# Patient Record
Sex: Female | Born: 1940
Health system: Southern US, Community
[De-identification: ages and names within clinical notes are randomized; demographics above are authoritative.]

## PROBLEM LIST (undated history)

## (undated) VITALS — BP 125/70 | HR 79 | Temp 98.2°F | Resp 18 | Ht 66.0 in | Wt 135.2 lb

## (undated) DIAGNOSIS — K760 Fatty (change of) liver, not elsewhere classified: Secondary | ICD-10-CM

## (undated) DIAGNOSIS — I639 Cerebral infarction, unspecified: Secondary | ICD-10-CM

## (undated) DIAGNOSIS — I4891 Unspecified atrial fibrillation: Secondary | ICD-10-CM

## (undated) DIAGNOSIS — C439 Malignant melanoma of skin, unspecified: Secondary | ICD-10-CM

## (undated) DIAGNOSIS — M1990 Unspecified osteoarthritis, unspecified site: Secondary | ICD-10-CM

## (undated) DIAGNOSIS — K754 Autoimmune hepatitis: Secondary | ICD-10-CM

## (undated) DIAGNOSIS — Z9221 Personal history of antineoplastic chemotherapy: Secondary | ICD-10-CM

## (undated) DIAGNOSIS — I10 Essential (primary) hypertension: Secondary | ICD-10-CM

## (undated) DIAGNOSIS — Z923 Personal history of irradiation: Secondary | ICD-10-CM

## (undated) DIAGNOSIS — K635 Polyp of colon: Secondary | ICD-10-CM

## (undated) DIAGNOSIS — H409 Unspecified glaucoma: Secondary | ICD-10-CM

## (undated) DIAGNOSIS — R569 Unspecified convulsions: Secondary | ICD-10-CM

## (undated) DIAGNOSIS — C50919 Malignant neoplasm of unspecified site of unspecified female breast: Secondary | ICD-10-CM

## (undated) DIAGNOSIS — E7800 Pure hypercholesterolemia, unspecified: Secondary | ICD-10-CM

## (undated) DIAGNOSIS — M810 Age-related osteoporosis without current pathological fracture: Secondary | ICD-10-CM

## (undated) HISTORY — PX: SKIN CANCER EXCISION: SHX779

## (undated) HISTORY — DX: Fatty (change of) liver, not elsewhere classified: K76.0

## (undated) HISTORY — PX: TONSILECTOMY, ADENOIDECTOMY, BILATERAL MYRINGOTOMY AND TUBES: SHX2538

## (undated) HISTORY — DX: Cerebral infarction, unspecified: I63.9

## (undated) HISTORY — DX: Age-related osteoporosis without current pathological fracture: M81.0

## (undated) HISTORY — PX: OTHER SURGICAL HISTORY: SHX169

## (undated) HISTORY — PX: AUGMENTATION MAMMAPLASTY: SUR837

## (undated) HISTORY — PX: TONSILLECTOMY: SUR1361

## (undated) HISTORY — DX: Unspecified atrial fibrillation: I48.91

## (undated) HISTORY — DX: Unspecified convulsions: R56.9

## (undated) HISTORY — DX: Pure hypercholesterolemia, unspecified: E78.00

## (undated) HISTORY — PX: EYE SURGERY: SHX253

## (undated) HISTORY — DX: Malignant melanoma of skin, unspecified: C43.9

## (undated) HISTORY — DX: Polyp of colon: K63.5

## (undated) HISTORY — DX: Essential (primary) hypertension: I10

## (undated) HISTORY — PX: COLONOSCOPY: SHX5424

## (undated) HISTORY — DX: Unspecified glaucoma: H40.9

## (undated) HISTORY — DX: Autoimmune hepatitis: K75.4

## (undated) HISTORY — DX: Unspecified osteoarthritis, unspecified site: M19.90

---

## 1988-10-07 HISTORY — PX: OTHER SURGICAL HISTORY: SHX169

## 2000-04-25 ENCOUNTER — Ambulatory Visit: Admission: RE | Admit: 2000-04-25 | Payer: Self-pay | Source: Ambulatory Visit | Admitting: Plastic Surgery

## 2001-04-03 ENCOUNTER — Ambulatory Visit: Admission: RE | Admit: 2001-04-03 | Payer: Self-pay | Source: Ambulatory Visit | Admitting: Plastic Surgery

## 2002-10-07 DIAGNOSIS — M810 Age-related osteoporosis without current pathological fracture: Secondary | ICD-10-CM

## 2002-10-07 HISTORY — DX: Age-related osteoporosis without current pathological fracture: M81.0

## 2002-10-15 ENCOUNTER — Ambulatory Visit
Admit: 2002-10-15 | Disposition: A | Discharge status: Home / Self Care | Payer: Self-pay | Source: Ambulatory Visit | Admitting: Specialist

## 2002-11-16 ENCOUNTER — Ambulatory Visit
Admit: 2002-11-16 | Disposition: A | Discharge status: Home / Self Care | Payer: Self-pay | Source: Ambulatory Visit | Admitting: Gastroenterology

## 2002-12-07 ENCOUNTER — Ambulatory Visit
Admit: 2002-12-07 | Disposition: A | Discharge status: Home / Self Care | Payer: Self-pay | Source: Ambulatory Visit | Admitting: Gastroenterology

## 2003-05-06 ENCOUNTER — Ambulatory Visit: Admission: RE | Admit: 2003-05-06 | Payer: Self-pay | Source: Ambulatory Visit | Admitting: Plastic Surgery

## 2003-06-10 ENCOUNTER — Ambulatory Visit
Admit: 2003-06-10 | Disposition: A | Discharge status: Home / Self Care | Payer: Self-pay | Source: Ambulatory Visit | Admitting: Gastroenterology

## 2004-10-07 DIAGNOSIS — C50919 Malignant neoplasm of unspecified site of unspecified female breast: Secondary | ICD-10-CM

## 2004-10-07 DIAGNOSIS — Z923 Personal history of irradiation: Secondary | ICD-10-CM

## 2004-10-07 DIAGNOSIS — Z9221 Personal history of antineoplastic chemotherapy: Secondary | ICD-10-CM

## 2004-10-07 HISTORY — DX: Malignant neoplasm of unspecified site of unspecified female breast: C50.919

## 2004-10-07 HISTORY — PX: BREAST BIOPSY: SHX20

## 2004-10-07 HISTORY — PX: OTHER SURGICAL HISTORY: SHX169

## 2004-10-07 HISTORY — PX: SENTINEL NODE BIOPSY: SHX2706

## 2004-10-07 HISTORY — DX: Personal history of antineoplastic chemotherapy: Z92.21

## 2004-10-07 HISTORY — PX: AUGMENTATION MAMMAPLASTY: SUR837

## 2004-10-07 HISTORY — PX: BREAST LUMPECTOMY: SHX2

## 2004-10-07 HISTORY — DX: Personal history of irradiation: Z92.3

## 2004-12-22 ENCOUNTER — Ambulatory Visit
Admit: 2004-12-22 | Disposition: A | Discharge status: Home / Self Care | Payer: Self-pay | Source: Ambulatory Visit | Admitting: Surgery

## 2004-12-27 ENCOUNTER — Ambulatory Visit: Admission: RE | Admit: 2004-12-27 | Payer: Self-pay | Source: Ambulatory Visit | Admitting: Surgery

## 2005-01-15 ENCOUNTER — Ambulatory Visit: Admission: RE | Admit: 2005-01-15 | Payer: Self-pay | Source: Ambulatory Visit | Admitting: Surgery

## 2005-01-15 HISTORY — PX: BREAST LUMPECTOMY: SHX2

## 2005-01-18 ENCOUNTER — Ambulatory Visit
Admit: 2005-01-18 | Disposition: A | Discharge status: Home / Self Care | Payer: Self-pay | Source: Ambulatory Visit | Admitting: Radiation Oncology

## 2005-01-28 ENCOUNTER — Ambulatory Visit: Admission: RE | Admit: 2005-01-28 | Payer: Self-pay | Source: Ambulatory Visit | Admitting: Surgery

## 2005-02-06 ENCOUNTER — Ambulatory Visit
Admit: 2005-02-06 | Disposition: A | Discharge status: Home / Self Care | Payer: Self-pay | Source: Ambulatory Visit | Admitting: Medical Oncology

## 2005-03-22 ENCOUNTER — Ambulatory Visit: Admission: EM | Admit: 2005-03-22 | Payer: Self-pay | Source: Ambulatory Visit | Admitting: Plastic Surgery

## 2005-03-30 ENCOUNTER — Emergency Department: Admit: 2005-03-30 | Payer: Self-pay | Source: Emergency Department | Admitting: Emergency Medicine

## 2005-05-15 ENCOUNTER — Ambulatory Visit
Admit: 2005-05-15 | Disposition: A | Discharge status: Home / Self Care | Payer: Self-pay | Source: Ambulatory Visit | Admitting: Radiation Oncology

## 2006-02-01 ENCOUNTER — Emergency Department: Admit: 2006-02-01 | Payer: Self-pay | Source: Emergency Department

## 2006-05-27 ENCOUNTER — Ambulatory Visit: Admission: RE | Admit: 2006-05-27 | Payer: Self-pay | Source: Ambulatory Visit | Admitting: Plastic Surgery

## 2006-10-07 HISTORY — PX: OTHER SURGICAL HISTORY: SHX169

## 2007-07-28 ENCOUNTER — Ambulatory Visit
Admit: 2007-07-28 | Disposition: A | Discharge status: Home / Self Care | Payer: Self-pay | Source: Ambulatory Visit | Admitting: Medical Oncology

## 2007-07-28 LAB — GFR

## 2007-07-28 LAB — BUN: BUN: 14 MG/DL (ref 7–21)

## 2007-07-28 LAB — CREATININE, SERUM: Creatinine: 0.5 MG/DL (ref 0.5–1.4)

## 2007-08-03 ENCOUNTER — Ambulatory Visit
Admit: 2007-08-03 | Disposition: A | Discharge status: Home / Self Care | Payer: Self-pay | Source: Ambulatory Visit | Admitting: Medical Oncology

## 2008-03-10 ENCOUNTER — Ambulatory Visit
Admit: 2008-03-10 | Disposition: A | Discharge status: Home / Self Care | Payer: Self-pay | Source: Ambulatory Visit | Admitting: Radiation Oncology

## 2008-04-04 ENCOUNTER — Ambulatory Visit
Admit: 2008-04-04 | Disposition: A | Discharge status: Home / Self Care | Payer: Self-pay | Source: Ambulatory Visit | Admitting: Radiation Oncology

## 2008-04-14 ENCOUNTER — Ambulatory Visit
Admit: 2008-04-14 | Disposition: A | Discharge status: Home / Self Care | Payer: Self-pay | Source: Ambulatory Visit | Admitting: Radiation Oncology

## 2008-04-14 LAB — CREATININE, SERUM: Creatinine: 0.7 MG/DL (ref 0.5–1.4)

## 2008-04-14 LAB — GFR

## 2008-04-14 LAB — BUN: BUN: 14 MG/DL (ref 7–21)

## 2008-04-19 ENCOUNTER — Ambulatory Visit
Admit: 2008-04-19 | Disposition: A | Discharge status: Home / Self Care | Payer: Self-pay | Source: Ambulatory Visit | Admitting: Radiation Oncology

## 2009-04-27 ENCOUNTER — Ambulatory Visit
Admit: 2009-04-27 | Disposition: A | Discharge status: Home / Self Care | Payer: Self-pay | Source: Ambulatory Visit | Admitting: Radiation Oncology

## 2010-05-09 ENCOUNTER — Ambulatory Visit
Admit: 2010-05-09 | Disposition: A | Discharge status: Home / Self Care | Payer: Self-pay | Source: Ambulatory Visit | Admitting: Radiation Oncology

## 2011-05-08 ENCOUNTER — Ambulatory Visit
Admit: 2011-05-08 | Discharge: 2011-05-08 | Disposition: A | Discharge status: Home / Self Care | Payer: Self-pay | Source: Ambulatory Visit

## 2011-07-23 LAB — ECG 12-LEAD
Atrial Rate: 76 {beats}/min
P Axis: 67 degrees
P-R Interval: 134 ms
Q-T Interval: 412 ms
QRS Duration: 76 ms
QTC Calculation (Bezet): 463 ms
R Axis: 72 degrees
T Axis: 64 degrees
Ventricular Rate: 76 {beats}/min

## 2011-07-25 NOTE — Progress Notes (Unsigned)
SURGEON:       Babs Bertin, MD      SURGERY DATE:  05/27/2006      ROOM NUMBER:   OPS            CLINICAL NOTE:  This is 70 year old woman who has a history of breast      cancer in the past and prosthetic implant reconstructions.  She has some      capsular contracture and was contemplating revision and implant exchange      for this, but she has been recommended by her radiologist and oncologist to      remove the implants so that her followup might be easier.  She has decided      to agree with them, and therefore, she comes to the operating room for      removal of implants and implant material bilaterally.  At the same time,      she wants to have a mastopexy reconstructive procedures with the tissue      that remains, knowing that she will be much smaller, but able to have a      better contour by these plastic surgery techniques.  Accordingly, she comes      to the operating room at this time for these indicated procedures.            PAST SURGICAL The patient's past history includes the right breast cancer      lumpectomy and the breast implants.  She has had a laparoscopic, a T A, she      has had a number of skin lesions removed for cancer and benign disease.            ALLERGIES:  She denies any allergies.            MEDICATIONS:  Include Evista and _____.            PHYSICAL EXAMINATION:   Height 5 feet 6 inches, weight 120 pounds.  She is      a nonsmoker.  Physical examination reveals the bilateral augmented breasts      with some distortion from capsular contracture and previous surgeries.  The      rest of the physical examination is noncontributory.            PLAN:  The patient has had a full discussion on a couple of occasions, as      to the nature of the proposed procedures.  She understands the possibility      of bleeding, infection, scarring, the necessity of a secondary surgery,      distortion, problems with wound healing, recurrence, damage to surrounding      structures, and other  risks.  Having thus, been duly informed, the patient      comes in for this indicated procedure.                        ___________________________________     Date Signed: _______________      Babs Bertin, MD      D 05/26/2006 11:40 A; T 05/26/2006 11:55 A; [id number] - - , [id number], #[id number]      CC:  Babs Bertin, MD

## 2011-07-25 NOTE — Op Note (Unsigned)
SURGEON:             Babs Bertin, MD      ADMITTED:            05/27/2006      PROCEDURE DATE: 05/27/2006      ROOM NUMBER:         POO POO 08            PREOPERATIVE DIAGNOSIS: Status post breast cancer and presence of breast      implant.            POSTOPERATIVE DIAGNOSIS:  Status post breast cancer and presence of breast      implant.            PROCEDURE PERFORMED: Removal of bilateral implant material and mastopexy      with capsulectomy.            ANESTHESIA:   General.            CLINICAL NOTE:  This 70 year old woman has previously had a diagnosis of      breast cancer and she has had a lumpectomy and other surgeries for this.      She currently has breast implants in place which are ruptured.  The patient      wants removal of the breast implant and not to have them replaced.  She      wants a mastopexy with the remaining tissues.            The patient fully understands the nature of the proposed procedure.  She      understands the possibility of bleeding, infection, scarring, necessity for      secondary surgery, distortion, problems with wound healing, recurrence,      damage to surrounding structures and other risks. She understands the      deflated state of her breast following this procedure which we will try to      improve with a mastopexy.            OPERATIVE TECHNIQUE:  This patient's chest was appropriately prepped and      draped.  Preoperative markings as to nipple position and symmetry had been      marked preoperatively in the holding area while in the sitting position.      According to these markings, excess skin above the nipple-areolar complex      to the new higher position which was saved for it was removed.  Deeper      dissection was done through this opening to expose the capsule of the      deeper implant. This was opened.  Implant material from ruptured implant      was removed.  Much irrigating and swabbing out of this material was done.      The capsule was quite  calcified.  A capsulectomy was done. Irrigation was      done again and hemostasis made to be perfect.  Internal cinching sutures      were used to build up the interior tissues that remained.  The      nipple-areolar complex was then sutured to the higher position saved for it      in a manner the mastopexy and then an advancement throughout and with a      gradient closure was done to fix and properly position at the new      nipple-areolar complex position as was marked out preoperatively.  On the left side an essentially identical procedure was done including the      capsulectomy and the mastopexy and removal of implant material.      Appropriate dressings were placed on all wounds.  The patient was returned      to the recovery room in good condition.            This procedure was done under general anesthesia. It did go quite smoothly      and a good result is certainly hoped for. The patient is instructed on the      local care of the wounds and will be followed in my office as an      outpatient.                                          ___________________________________     Date Signed: _______________      Babs Bertin, MD                  D 05/27/2006  3:13 P; T 05/28/2006  9:21 A; [id number] - - , [id number], #[id number]      CC:  Kassie Mends, MD           Babs Bertin, MD

## 2012-05-19 ENCOUNTER — Encounter: Payer: Self-pay | Admitting: Radiation Oncology

## 2012-05-19 ENCOUNTER — Ambulatory Visit: Payer: Self-pay | Admitting: Radiation Oncology

## 2012-05-19 ENCOUNTER — Ambulatory Visit: Payer: Medicare Other | Attending: Radiation Oncology

## 2012-05-19 DIAGNOSIS — C50419 Malignant neoplasm of upper-outer quadrant of unspecified female breast: Secondary | ICD-10-CM

## 2012-05-19 NOTE — Progress Notes (Signed)
Diagnosed with right breast cancer in 2006 S/P lumpectomy, chemotherapy and radiation.  Full range of motion, no swelling, no pain.  Regular diet, normal bowel movements.  Had mammogram in April 2013.

## 2012-05-19 NOTE — Progress Notes (Signed)
Pt  Continues to do well -   Stressful environment at work / husband with health problems   Dr. Pincus Large to follow - on Evista per Dr. Ernestina Penna    Dexa scan  Stable    No new medical problems    HEENT negative  Chest clear  [facility]  Reg  Gr ii/vi sys m  Abd without HS  Nodes without cervical / SC / axillary  Breast  Right stable defect  In UOQ  Without masses/ discharge  Left non-tender without masses/ discharge    Imp and Plan  Adenoca of the breast  NED  Continue follow-up per plan      Shanda Bumps

## 2012-09-28 NOTE — Op Note (Unsigned)
ADMITTED:         04/03/2001      PROCEDURE DATE:   04/03/2001      ROOM NUMBER:            POO POO 09            CLINICAL  NOTE:   This  71 year old woman  has  a  history  of  basal  cell      carcinoma.  She has a suspicious lesion  at  a scar from a previous one.  A      recurrence is assumed.  She comes to  the  operating  room  for excision of      this.  She also has a suspicious lesion  of  the left arm which is deep and      pigmented.  This is to be removed at the same time.            The  patient  understands  the  nature  of  the  proposed  procedure.   She      understands the possibility of bleeding, infection, scarring, necessity for      secondary surgery, distortion, problems  with  wound  healing,  recurrence,      damage to surrounding structures and other risks.            PREOPERATIVE DIAGNOSES:      1.    Basal cell carcinoma, left shoulder.      2.    Atypical nevus, left arm.            POSTOPERATIVE DIAGNOSES:      1.    Basal cell carcinoma, left shoulder.      2.    Atypical nevus, left arm.            OPERATIVE PROCEDURES:      1.    Excision of basal cell carcinoma, left shoulder.      2.    Excision of atypical nevus, left arm.            ANESTHESIA:  Local.            OPERATIVE  TECHNIQUE:   This  patient's   left   shoulder   and   arm  were      appropriately prepped and draped.  A  roughly  circular  incision  was made      around  the  lesion  of  the  left  shoulder.    This   was  submitted  for      pathological study.            In order to close this without tension  onto the axilla, counter flaps were      made on either end of the scar with transposition  and  advancement so that      the final scar was in line with the old scar and also was able to be closed      with minimal tension and no stress on  the  adjacent  axilla.  A multilayer      plastic closure was done.            The lesion of the left arm was cut out  in  an elliptical manner.  This was      submitted for  pathological study.  A layered plastic closure was  done.            Appropriate dressings were placed on all  wounds.  The patient was returned      to the holding area in good condition.            This procedure was done under local anesthesia.   It  did go quite smoothly      and a good result is certainly hoped  for.   The  patient was instructed on      local  care  of  the  wounds and will  be  followed  in  my  office  as  an      outpatient.                  ___________________________________      Babs Bertin, MD            D 04/03/2001  2:45 P; T 04/03/2001  3:31 P; 873 - - [id number], [id number],      #[id number]      CC:   Alease Medina, MD            Babs Bertin, MD

## 2012-09-29 NOTE — Op Note (Unsigned)
SURGEON:             Babs Bertin, MD      ADMITTED:            05/06/2003      PROCEDURE DATE: 05/06/2003      ROOM NUMBER:         POO POO 07            CLINICAL NOTE:  This 71 year old woman has a suspicious lesion, which is      painful, draining, and ulcerated on her right ear, at the crest of the      antihelical fold.  She comes to the operating room at this time for      excision of this, with frozen section monitoring and flap repair.            The patient fully understands the nature of the proposed procedure.  She      understands the possibility of bleeding, infection, scarring, necessity of      secondary surgery, distortion, problems with wound healing, recurrent      damage to surrounding structures and other risks.            PREOPERATIVE DIAGNOSIS:  Suspicious lesion, right ear.            PROCEDURE:  Excision of lesion with frozen section monitoring of flap      repair.            POSTOPERATIVE DIAGNOSIS:  Suspicious lesion, right ear.            ANESTHESIA:  Local.            OPERATIVE TECHNIQUE:  This patient's right ear was appropriately prepped      and draped.  Incision was made to cut out this mass right at the apex of      the antihelical fold.  Underlying cartilage, which was painful to the      patient, was removed with this; this was all submitted for pathological      study.  This came back as margins clear of tumor.            Returning then to the patient, I had to elevate skin as a flap in order to      allow closure in this area of the ear, where the skin is tightly adherent      to the cartilage.  Surrounding tissues were elevated, backcut was made and      then transposition of the skin was done laterally, inferiorly, to close the      wound.  The donor was closed directly.  The recipient was inset and sutured      all around to complete the reconstruction.            Appropriate dressings were placed on all wounds.  The patient was returned      to the holding area in good  condition.            This procedure was done under local anesthesia.  It did go quite smoothly      and a good result was certainly hoped for.  The patient was instructed on      the local care of the wound and will be followed in my office as an      outpatient.  ___________________________________     Date Signed: _______________      Babs Bertin, MD                  D 05/06/2003  4:12 P; T 05/07/2003  8:53 P; 1610 - - , [id number], #[id number]      CC:  Kassie Mends, MD           Babs Bertin, MD

## 2012-10-09 NOTE — Op Note (Signed)
DATE OF BIRTH:                        1941/05/13      ADMISSION DATE:                     01/15/2005            PATIENT LOCATION:                     [id number]            DATE OF PROCEDURE:                   01/15/2005      SURGEON:                            Despina Hidden, MD      ASSISTANT(S):                  PREOPERATIVE DIAGNOSIS:  RIGHT BREAST CANCER.            POSTOPERATIVE DIAGNOSIS:   RIGHT BREAST CANCER.            PROCEDURES      1.   RIGHT BREAST LUMPECTOMY, PARTIAL MASTECTOMY.      2.   SENTINEL NODE BIOPSY WITH SENTINEL NODE INJECTION WITH DEEP RIGHT      AXILLARY LYMPH NODE EXCISION.            ANESTHESIA:  1% lidocaine and 0.5% Marcaine and IV sedation.            DESCRIPTION OF PROCEDURE:  The patient is a very pleasant woman who has a      right breast cancer in the upper outer quadrant.  The patient has a history      of silicone implants.  The need for a reexcision with sentinel node      excision was all discussed with her.  The risks of bleeding, infection,      possible injury to the implant, and need for reoperation were all outlined      to her.  She understood all of these and agreed to this operation.            The patient was taken to the operating room and placed in the supine      position.  She had been injected first by the department of radiology.      Now, with sedation and monitoring achieved by the department of anesthesia      and prep and drape performed in the usual fashion, I infiltrated the      subareolar position with 3 mL of the blue dye.            I now created an incision through the old scar.  I performed an excision of      the deep right axillary lymph node, sending this to the department of      pathology.            I now performed a wide excision around the previous biopsy.  I did this      with the electrocautery to avoid the implant.  I stayed above the capsule      in the pectoralis major muscle.            Now, with good hemostasis achieved, I closed  the  wound in a multiple-layer      fashion followed by Mastisol and Steri-Strips.  All counts were correct,      and there were no complications and minimal blood loss.                                                Electronic Signing MD: Despina Hidden, MD  ([id number])            D: 01/15/2005 by Despina Hidden, MD      T: 01/16/2005 by [id number] ([id number]) (C:[id number])      cc:  Despina Hidden, MD

## 2012-10-09 NOTE — Op Note (Signed)
DATE OF BIRTH:                        September 01, 1941      ADMISSION DATE:                     12/27/2004            PATIENT LOCATION:                     [id number]            DATE OF PROCEDURE:                   12/27/2004      SURGEON:                            Despina Hidden, MD      ASSISTANT(S):                  PREOPERATIVE DIAGNOSIS:  LEFT BREAST MASS.            POSTOPERATIVE DIAGNOSIS:  LEFT BREAST MASS.            PROCEDURE:  EXCISION OF LEFT BREAST MASS.            ANESTHESIA:  1% lidocaine and 0.5% Marcaine and IV sedation.            DESCRIPTION OF PROCEDURE:  After the indications, risks, and benefits had      been discussed with the patient and outlined to her, she was taken to the      operating room where IV antibiotics were administered.  Sedation and      monitoring were provided by the department of anesthesia.  She was prepped      and draped in the usual sterile fashion.            The patient had an implant so we infiltrated superficially with the local      anesthetic solution.  We created an incision overlying the mass and      dissected the mass free using electrocautery.  We used no sharp instruments      at this point.  I excised the mass completely.  There was also an      additional small palpable mass towards the axilla.  This was excised with      the specimen in an en bloc resection.            With good hemostasis now achieved, I closed the wound in multiple-layered      fashion followed by Mastisol and Steri-Strips.  Simple dressings were      applied.  All counts were correct.  There were no complications.  Blood      loss was minimal.                                                Electronic Signing MD: Despina Hidden, MD  ([id number])            D: 12/27/2004 by Despina Hidden, MD      T: 12/28/2004 by [id number] ([id number]) ([id number])      cc:  Despina Hidden, MD

## 2012-10-09 NOTE — Op Note (Unsigned)
SURGEON:             Babs Bertin, MD      ADMITTED:            03/22/2005      PROCEDURE DATE: 03/22/2005      ROOM NUMBER:         POO POO 13            CLINICAL NOTE:  This 72 year old woman presents with an office biopsy      diagnosis of melena of the left arm.  She comes to the operating room at      this time for wider excision to try to rid her of this once and for all.            The patient has been fully informed as to the nature of the proposed      procedure.  She understands the possibility of bleeding, infection,      scarring, necessity of secondary surgery, distortion, problems of wound      healing, recurrence, damage to surrounding structures, and other risks.            PREOPERATIVE DIAGNOSIS:  Melanoma in situ, left arm.            PROCEDURE:  Wide resection of melanoma.            POSTOPERATIVE DIAGNOSIS:  Melanoma in situ, left arm.            ANESTHESIA:  Local.            OPERATIVE TECHNIQUE:  This patient's left arm was appropriately prepped and      draped. Careful marking was made of the previous incision and an      appropriate collar of normal-appearing tissue around it.  This large mass      of tissue was all removed full thickness into fat tissue.  This was all      submitted to pathological study including _____.            Irrigation was done and hemostasis was made to be perfect.  Closure was      then done by undermining and advancing tissues and then with a small      double-apposing back cuts, bringing this together so that the final scar      was in the normal skin tension line.  Then a multilayer closure was done      after inset to complete the reconstruction.            Appropriate dressings were placed on all wounds.  The patient was returned      to the holding area in good condition.            This procedure was done under local anesthesia.  It did go quite smoothly      and a good result is certainly hoped for.  The patient was instructed on      the local care  of the wound and will be followed in my office as an      outpatient.                                          ___________________________________     Date Signed: _______________      Babs Bertin, MD  D 03/22/2005 12:34 P; T 03/22/2005  3:37 P; 9229 - - , [id number], #[id number]      CC:  Kassie Mends, MD           Babs Bertin, MD

## 2012-10-09 NOTE — Op Note (Unsigned)
ROOM NUMBER:                           OPS OPS 10            SURGEON:                                Campbell Lerner, MD            DATE:                                   01/28/2005                  PREOPERATIVE DIAGNOSIS:  Right breast cancer.            POSTOPERATIVE DIAGNOSIS:  Right breast cancer.            OPERATION:  Single lumen MRI port catheter  left subclavian vein            ANESTHESIA:  MAC, local.            INDICATIONS FOR PROCEDURE:  This is a 73 year old female with      receptor-negative breast cancer. She needs a port for chemotherapy.            PROCEDURE:  The patient was brought to the operating room and placed on the      operating table in the supine position.  IV sedation was administered.  The      chest wall and neck were prepped with Betadine gel prep and draped in      sterile fashion.  Left subclavian venipuncture was performed utilizing      Seldinger technique.  The catheter  was inserted into the left subclavian      vein.  Intraoperative fluoroscopy was utlilized to confirm the presence of      the catheter tip in the distal SVC, and a pocket was created on the left      chest wall.  The catheter was tunneled to the pocket site and then      connected to the port.  The pocket was inspected.  It was dry.  The port      was was placed within the pocket and secured to the chest wall with Vicryl      sutures.  The incision was closed in layered fashion with Vicryl and      Monocryl. The skin was washed and dried.  The port was accessed. Easy      backfill and flush were noted.  Dermabond was applied to the incision.  The      patient was taken to the PACU at the end of the case having tolerated the      procedure well.                                          ______________________________     Date Signed: __________      Campbell Lerner, MD            ZOX:[id number]      Dictated:    01/29/2005  7:45 P      Transcribed: 01/29/2005 11:29 P  Job Number: [id number]      Document  Number: [id number]            CC:  Campbell Lerner, MD

## 2013-01-14 ENCOUNTER — Other Ambulatory Visit: Payer: Self-pay | Admitting: Radiation Oncology

## 2013-01-20 ENCOUNTER — Other Ambulatory Visit: Payer: Self-pay | Admitting: Radiation Oncology

## 2013-01-20 ENCOUNTER — Ambulatory Visit
Admission: RE | Admit: 2013-01-20 | Discharge: 2013-01-20 | Disposition: A | Discharge status: Home / Self Care | Payer: Medicare Other | Source: Ambulatory Visit | Attending: Radiation Oncology | Admitting: Radiation Oncology

## 2013-01-20 ENCOUNTER — Inpatient Hospital Stay
Admission: RE | Admit: 2013-01-20 | Discharge: 2013-01-20 | Disposition: A | Discharge status: Home / Self Care | Payer: Self-pay | Source: Ambulatory Visit | Attending: Radiation Oncology | Admitting: Radiation Oncology

## 2013-01-20 DIAGNOSIS — Z9889 Other specified postprocedural states: Secondary | ICD-10-CM | POA: Insufficient documentation

## 2013-05-11 ENCOUNTER — Other Ambulatory Visit: Payer: Self-pay | Admitting: Internal Medicine

## 2013-05-13 ENCOUNTER — Ambulatory Visit: Payer: Medicare Other

## 2013-05-19 ENCOUNTER — Encounter: Payer: Self-pay | Admitting: Radiation Oncology

## 2013-05-19 ENCOUNTER — Ambulatory Visit: Payer: Medicare Other | Attending: Radiation Oncology

## 2013-05-19 ENCOUNTER — Ambulatory Visit: Payer: Self-pay | Admitting: Radiation Oncology

## 2013-05-19 DIAGNOSIS — C50419 Malignant neoplasm of upper-outer quadrant of unspecified female breast: Secondary | ICD-10-CM | POA: Insufficient documentation

## 2013-05-19 NOTE — Progress Notes (Signed)
Infiltrating ductal adenoca of the breast   T2 N0 M0    Pt continues to do well  No new medical problems.  Colonoscopy scheduled  Mammogram April  2014  [facility]  3D tomosynthesis   Negative with residual scar tissue unchanged from previous.  DEXA scan August 2014  Dr. Blake Divine  Q 6 months    HEENT  Negative  Chest clear  [facility]  Reg without m  Nodes without cervical/ SC/ axillary  Breast - non-tender   Right - retraction at biopsy site without masses/ discharge heterogenously dense  Left  Non-tender heterogeneously dense without masses/discharge  Abd non-tender without HS, masses    Mammogram April 2014  Negative except ++ residual scar    Imp and Plan  Adenoca of the breast   NED  Continue follow-up and yearly mammogram  Follow-up with Dr. Bing Matter and primary care    Shanda Bumps

## 2013-05-19 NOTE — Patient Instructions (Signed)
Follow-up Dr. Catalina Gravel 6 months  November 2014  Dr. Marlou Starks 12 months  November 2014  Mammogram April 2015  [facility] 2015    Read good books, no more house refinishing,  Travel and have fun

## 2013-05-19 NOTE — Progress Notes (Signed)
Diagnosed with right breast cancer 12/2004 S/P right lumpectomy, chemotherapy and radiation.  Full range of motion with arms, no swelling or pain.  Regular diet, normal bowel movements.  Last mammogram  01/2013:    Mammography diagnostic bilral [IMG600] (Order [id number])                                 Assessment/Recommendation          Assessment  Side  Recommendation     Benign Finding(s) [2]   OC2 BENIGN FINDINGS; RECOMMEND MAMMOGRAM IN 1 YEAR                Addendum       Bobette Mo, DO Wed Jan 20, 2013 10:31:05 AM EDT            ORIGINAL REPORT    BILATERAL MAMMOGRAM   CLINICAL STATEMENT: No prior studies are available for comparison.   Previous studies have been requested.   COMPARISON: No prior studies are available for comparison. PARENCHYMAL   PATTERN: The breast tissue is heterogeneously dense (approximately   50-75% glandular). Small masses may be obscured in breasts of this type.   FINDINGS: Bilateral CC and MLO projection digital mammograms were   obtained. Tomosynthesis was also performed.   Extensive architectural distortion is noted of the upper outer quadrant   of the right breast at the surgical site with prominent dystrophic   calcifications and skin thickening. Spiculated mass at the surgical site   likely represents postsurgical changes. Punctate and benign-appearing   calcifications are identified throughout the fibroglandular tissues of   both breasts. There is no axillary lymphadenopathy.   IMPRESSION: Extensive postsurgical/posttreatment changes of left breast.   BIRADS: Bi-rads(tm)category 0: Need additional evaluation.   RECOMMENDATION: It is recommended the patient obtain her previous   studies and comparison made to the current examination to evaluate for   stability. An addendum will be added to this examination when the   previous studies become available.   ADDITIONAL NOTE: This examination was further reviewed with the aid of   Computer Aided Detection (CAD).   Fonnie Mu, MD    01/20/2013 9:10 AM ADDENDUM #1  Since the study of 01/20/2013 was evaluated prior examinations performed   on 01/13/2012 and 01/10/2011 have become available. Comparison is   performed.   FINDINGS: The appearance of the breasts is stable.   IMPRESSION:   BIRADS: Bi-rads(tm)category 2: Benign finding-negative.   Written results will be provided to the patient per MQSA requirements.   Fonnie Mu, MD   01/20/2013 10:31 AM          Study Result       BILATERAL MAMMOGRAM   CLINICAL STATEMENT: No prior studies are available for comparison.   Previous studies have been requested.   COMPARISON: No prior studies are available for comparison. PARENCHYMAL   PATTERN: The breast tissue is heterogeneously dense (approximately   50-75% glandular). Small masses may be obscured in breasts of this type.   FINDINGS: Bilateral CC and MLO projection digital mammograms were   obtained. Tomosynthesis was also performed.   Extensive architectural distortion is noted of the upper outer quadrant   of the right breast at the surgical site with prominent dystrophic   calcifications and skin thickening. Spiculated mass at the surgical site   likely represents postsurgical changes. Punctate and benign-appearing   calcifications are identified throughout the fibroglandular tissues of  both breasts. There is no axillary lymphadenopathy.   IMPRESSION:   Extensive postsurgical/posttreatment changes of left breast.   BIRADS: Bi-rads(tm)category 0: Need additional evaluation.   RECOMMENDATION: It is recommended the patient obtain her previous   studies and comparison made to the current examination to evaluate for   stability. An addendum will be added to this examination when the   previous studies become available.   ADDITIONAL NOTE: This examination was further reviewed with the aid of   Computer Aided Detection (CAD).   Fonnie Mu, MD   01/20/2013 9:10 AM          Imaging       Mammography diagnostic bilateral (Order #[id number]) on  01/20/2013 - Imaging Information          Result History

## 2013-05-24 ENCOUNTER — Ambulatory Visit: Payer: Medicare Other

## 2013-05-26 ENCOUNTER — Ambulatory Visit
Admission: RE | Admit: 2013-05-26 | Discharge: 2013-05-26 | Disposition: A | Discharge status: Home / Self Care | Payer: Medicare Other | Source: Ambulatory Visit | Attending: Internal Medicine | Admitting: Internal Medicine

## 2013-05-26 DIAGNOSIS — M899 Disorder of bone, unspecified: Secondary | ICD-10-CM | POA: Insufficient documentation

## 2013-10-07 DIAGNOSIS — I639 Cerebral infarction, unspecified: Secondary | ICD-10-CM

## 2013-10-07 HISTORY — DX: Cerebral infarction, unspecified: I63.9

## 2013-12-30 ENCOUNTER — Other Ambulatory Visit: Payer: Self-pay | Admitting: Radiation Oncology

## 2013-12-30 DIAGNOSIS — C50919 Malignant neoplasm of unspecified site of unspecified female breast: Secondary | ICD-10-CM

## 2014-01-05 HISTORY — PX: BREAST BIOPSY: SHX20

## 2014-01-24 ENCOUNTER — Other Ambulatory Visit: Payer: Self-pay | Admitting: Radiation Oncology

## 2014-01-24 ENCOUNTER — Ambulatory Visit
Admission: RE | Admit: 2014-01-24 | Discharge: 2014-01-24 | Disposition: A | Discharge status: Home / Self Care | Payer: Medicare Other | Source: Ambulatory Visit | Attending: Radiation Oncology | Admitting: Radiation Oncology

## 2014-01-24 DIAGNOSIS — C50919 Malignant neoplasm of unspecified site of unspecified female breast: Secondary | ICD-10-CM

## 2014-01-24 DIAGNOSIS — N63 Unspecified lump in unspecified breast: Secondary | ICD-10-CM

## 2014-01-25 ENCOUNTER — Ambulatory Visit
Admission: RE | Admit: 2014-01-25 | Discharge: 2014-01-25 | Disposition: A | Discharge status: Home / Self Care | Payer: Medicare Other | Source: Ambulatory Visit | Attending: Radiation Oncology | Admitting: Radiation Oncology

## 2014-01-25 ENCOUNTER — Ambulatory Visit: Payer: Self-pay

## 2014-01-25 DIAGNOSIS — D249 Benign neoplasm of unspecified breast: Secondary | ICD-10-CM | POA: Insufficient documentation

## 2014-01-25 DIAGNOSIS — N63 Unspecified lump in unspecified breast: Secondary | ICD-10-CM

## 2014-01-25 MED ORDER — LIDOCAINE HCL 2 % IJ SOLN
INTRAMUSCULAR | Status: AC
Start: 2014-01-25 — End: 2014-01-25
  Filled 2014-01-25: qty 20

## 2014-05-17 ENCOUNTER — Ambulatory Visit: Payer: Medicare Other

## 2014-05-18 ENCOUNTER — Encounter: Payer: Self-pay | Admitting: Radiation Oncology

## 2014-05-18 ENCOUNTER — Ambulatory Visit: Payer: Medicare Other | Attending: Radiation Oncology

## 2014-05-18 ENCOUNTER — Ambulatory Visit: Payer: Medicare Other | Admitting: Radiation Oncology

## 2014-05-18 DIAGNOSIS — C50919 Malignant neoplasm of unspecified site of unspecified female breast: Secondary | ICD-10-CM | POA: Insufficient documentation

## 2014-05-18 NOTE — Patient Instructions (Signed)
Follow-up Dr. Bing Matter  November 2015   May 2016  Left mammogram October 2015  Dr. Ernestina Penna yearly  Or as need  [facility]  August 2016    Find time to travel/ read good books/ have fun/ exercise

## 2014-05-18 NOTE — Progress Notes (Signed)
Finished xrt for right breast ca 07-2005. Last mammo 01-2014, benign biopsy,back in 6 months. No breast pain, no lymphedema  PACS Images    Show images for Mammo Digital Diagnostic Bilateral W CAD      Mammo Digital Diagnostic Bilateral W CAD [IMG600] (Order [id number])    Status: Final result         Assessment/Recommendation    Assessment Side Recommendation    Incomplete- Need Additional Imaging Evaluation and/or Prior Mammograms for Comparison [0]  OC0 RECOMMEND ADDITIONAL VIEWS; INCOMPLETE      Study Result    CLINICAL INDICATION: Diagnostic mammogram. 3D tomosynthesis. Personal  history of right breast cancer with lumpectomy  BREAST PARENCHYMAL PATTERN: Heterogeneously dense which can decrease the  sensitivity of mammography, 51-74% glandular tissue.  FINDINGS: Digital mammography was performed. CC and MLO views of both  breasts were obtained. Comparison made to studies dated back to 2014.  Stable postsurgical changes in the right breast with skin thickening and  dystrophic calcifications throughout the surgical bed. Increased tissue  density in the left outer breast which persists with tomography.  Ultrasound is recommended. Stable axillary lymph node on the left  breast. Tomograms demonstrate benign appearance to the right surgical  bed. Tomograms confirm the change in the breast density in the left  breast.  IMPRESSION:   New tissue density in the left breast when compared to the  prior study. This is confirmed with tomograms. Ultrasound recommended to  determine if this is cystic or solid.  A second reading of this mammogram was provided with computer assisted  diagnosis. No additional findings requiring imaging were noted.  Bi-rads(tm)category 0: Needs additional imaging evaluation.  RECOMMENDATION: Left breast ultrasound. The patient was notified of  these findings and ultrasound performed.  Kinnie Feil, MD   01/24/2014 9:28 AM      Imaging    Mammo Digital Diagnostic Bilateral W CAD (Order #[id number]) on  01/24/2014 - Imaging Information      Result History    MAMMO DIGITAL DIAGNOSTIC BILATERAL W CAD (Order #[id number]) on 01/24/2014 - Order Result History Report      Signed by    Signed Date/Time    Phone Pager    Hedy Camara 01/24/2014 09:28 [phone]       Exam Information    Status Exam Begun    Exam Ended       Final [99] 01/24/2014 08:43 01/24/2014 08:54      Reviewed by    Shanda Bumps, MD 01/24/2014 11:21 AM      External Result Report    External Result Report      All Charges for This Encounter    Code Description Service Date Service Provider Modifiers Qty    H4010002 [facility] DIGITAL MAMMO DX BIL 01/24/2014 MV MAM 1  1    H4010001 HC CAD DX MAMMO 01/24/2014 MV MAM 1  1    [id number] HC DIGITAL BREAST TOMO DX BI 01/24/2014 MV MAM 1 TC 1    77062 CHG DIGITAL BREAST TOMOSYNTHESIS BILATERAL 01/24/2014 Shanda Bumps, MD 26 1    G0204 PR DIAGNOSTICMAMMOGRAPHYDIGITAL 01/24/2014 MV MAM 1 26 1     77051 CHG COMPUTER-AIDED DETECTION DX MAMMOGRAPHY 01/24/2014 Shanda Bumps, MD 26 1      Mammo Digital Diagnostic Bilateral W CAD (Order [id number])   Imaging   Order: [id number]    Released By: Merilynn Finland   Authorizing: Shanda Bumps, MD    Date: 01/24/2014   Department: [facility]  Patient Demographics    Patient Name Sex DOB Address Phone    [name] [name], [name] [name] Female 03/08/1941 [address]  [place] [address] [phone] [name])      Department    Name Address          [facility] Mammography [address]  [place] [address]        Visit Coverage     Payor Plan Sponsor Code Group Number Group Name    MEDICARE MEDICARE PART A AND B               Visit Coverage Subscriber     Subscriber ID Subscriber Name Subscriber Address    [id number] [name] [name] [address]      [place], [place] [address]            Original Order    Ordered On Ordered By       12/30/2013 4:20 PM Tressie Stalker S             Associated Diagnoses    Breast cancer, unspecified laterality          Scheduling Instructions    Please remind  patient to bring MD order, referral or prescription with them on the day of the exam. If the order was faxed from the doctor's office to central scheduling please make sure the order is scanned into Epic.      Comments    3d      Order Questions    Question Answer Comment    Exam reason Breast cancer, unspecified laterality     Note:  Enter reason for exam      Order Requisition    Mammo Digital Diagnostic Bilateral W CAD (Order #[id number]) on 01/24/14        Lab Information    Lab    [facility]   [address]  [place] [address]                Patient Information    Patient Name Account ID # Sex DOB Age    [name] [name], [name] [name] [id number] Female 1941-08-22 72 yrs      Lab Collection Information    Collected: 01/25/2014 10:45 AM         Lab IDs    Specimen #    [id number]      Order Provider Info      Office phone Pager/beeper E-mail    Ordering User Interface, Lab In [place] -- -- --    Authorizing Provider Shanda Bumps, MD [phone] -- --      Results    Surgical Pathology (Order [id number])         Result Information    Abnormality Status Priority Source     Final result (01/26/2014 1719) Routine       Result Base Name    Surgical Pathology (Order #[id number]) on 01/25/14         Result Date - 01/26/2014      Result Narrative    [facility] [id number] [name] [name]   HOSPITAL LABORATORY MED [facility] #: [id number]       Procedure Date & Time: 01/25/2014, 10:45   Copies To: Kinnie Feil, Dr   SURGICAL PATHOLOGY REPORT   DIAGNOSIS:--------------------------------------     LEFT BREAST NEEDLE BIOPSIES:   - BENIGN BREAST TISSUE WITH STROMAL SCLEROSIS.     1B           MICROSCOPIC DESCRIPTION:----------------------------   The specimen consists of  needle biopsies of fragmented fibrotic breast   tissue. No evidence of malignancy is identified. Deeper sections are   examined.   GJS/mb       PRE-OPERATIVE DIAGNOSIS:-----------------------------   LEFT BREAST MASS     POST-OPERATIVE  DIAGNOSIS:---------------------------   SAME     CLINICAL INFORMATION:---------------------------------   SAME     SPECIMEN DESCRIPTION:   BREAST, NEEDLE BIOPSY, LEFT U.S. GUIDANCE     GROSS DESCRIPTION:-------------------------------------   The specimen is received in formalin labeled "left breast" and   consists of six tan pale to yellow needle core biopsy fragments   measuring 0.5 - 1.0 cm in length and 0.1 x 0.2 cm in diameter. The   entire specimen is submitted in one cassette. Time of collection is   10:45 am and time placed in formalin is 10:50 am. Cold ischemic time   is 5 minutes and fixation time is 9 hours and 10 minutes.   RK/mb     <Electronically Signed>   Candy Sledge, DR   01/26/2014 17:19           Printed/Date Page 1 of 1 Brooten, RIOT   01/26/2014 MED REC #: [id number]       Linked Documents    View Image      Lab and Collection    Surgical Pathology on 01/25/2014      Result History    SURGICAL PATHOLOGY on 01/26/2014      Authorizing Provider Information    Name: Shanda Bumps, MD Fax: [phone]    Phone: [phone] Pager:       Reviewed by List    Shanda Bumps, MD on 05/04/2014 2:07 PM      Surgical Pathology (Order [id number])   Pathology and Cytology   Order: [id number]    Released By:  (auto-released)   Authorizing: Shanda Bumps, MD    Date: 01/25/2014   Department: [facility]         Order Information    Order Date/Time Release Date/Time Start Date/Time End Date/Time    01/25/14 10:45 AM 01/25/14 10:45 AM 01/25/14 10:45 AM 01/25/14 10:45 AM      Original Order    Ordered On Ordered By       01/25/2014 10:45 AM Interface, Lab In [facility]             Order Details    Frequency Duration Priority Order Class    Once 1 occurrence Routine Normal      Encounter    View Encounter      Reprint Requisition    Surgical Pathology (Order [id number]) on 01/25/14      Original Order    Ordered On Ordered By       01/25/2014 10:45 AM Interface, Lab In [place]             Collection Information     Collected: 01/25/2014 10:45 AM    Resulting Agency: [facility]         Order Provider Info      Office phone Pager/beeper Cabin crew, Lab In [place] -- -- --    Authorizing Provider Shanda Bumps, MD [phone] -- --

## 2014-05-18 NOTE — Progress Notes (Signed)
[place] of the breast   2006    Pt continues to do well  Allergic reaction to rubber Ace Bandage    [name] [name]  May / November    No new medical problems  Plans to work one more year    HEENT negative  Chest clear  [facility]  Reg ii/vi sys m  Nodes without cervical/ SC/ axillary  Breast  Right retraction at biopsy site  Without masses/ discharge               Left heterogeneously dense without masses/ discharge  Abd non-tender without HS  Neuro  Oriented x 3  Without focal sensory/ motor abnormalities    Mammogram   6 month follow-up on left  October 2015    Imp and Plan  [place] of the right breast  NED    Continue mammogram as scheduled  Left October 2015  Bilateral  April 2016    Primary care yearly  Dr. Rubin Payor    Mammogram  April 2015

## 2014-06-27 ENCOUNTER — Other Ambulatory Visit: Payer: Self-pay | Admitting: Radiation Oncology

## 2014-06-27 DIAGNOSIS — N63 Unspecified lump in unspecified breast: Secondary | ICD-10-CM

## 2014-08-08 ENCOUNTER — Ambulatory Visit
Admission: RE | Admit: 2014-08-08 | Discharge: 2014-08-08 | Disposition: A | Discharge status: Home / Self Care | Payer: Medicare Other | Source: Ambulatory Visit | Attending: Radiation Oncology | Admitting: Radiation Oncology

## 2014-08-08 ENCOUNTER — Other Ambulatory Visit: Payer: Self-pay | Admitting: Radiation Oncology

## 2014-08-08 DIAGNOSIS — Z9011 Acquired absence of right breast and nipple: Secondary | ICD-10-CM | POA: Insufficient documentation

## 2014-08-08 DIAGNOSIS — Z09 Encounter for follow-up examination after completed treatment for conditions other than malignant neoplasm: Secondary | ICD-10-CM

## 2014-08-08 DIAGNOSIS — N63 Unspecified lump in unspecified breast: Secondary | ICD-10-CM

## 2014-08-08 DIAGNOSIS — Z853 Personal history of malignant neoplasm of breast: Secondary | ICD-10-CM | POA: Insufficient documentation

## 2014-10-07 HISTORY — PX: BREAST BIOPSY: SHX20

## 2014-10-13 DIAGNOSIS — Z9181 History of falling: Secondary | ICD-10-CM | POA: Diagnosis not present

## 2014-10-13 DIAGNOSIS — Z09 Encounter for follow-up examination after completed treatment for conditions other than malignant neoplasm: Secondary | ICD-10-CM | POA: Diagnosis not present

## 2014-10-13 DIAGNOSIS — Z0000 Encounter for general adult medical examination without abnormal findings: Secondary | ICD-10-CM | POA: Diagnosis not present

## 2014-10-20 DIAGNOSIS — Z961 Presence of intraocular lens: Secondary | ICD-10-CM | POA: Diagnosis not present

## 2014-10-20 DIAGNOSIS — H2512 Age-related nuclear cataract, left eye: Secondary | ICD-10-CM | POA: Diagnosis not present

## 2014-10-20 DIAGNOSIS — H40023 Open angle with borderline findings, high risk, bilateral: Secondary | ICD-10-CM | POA: Diagnosis not present

## 2014-11-03 DIAGNOSIS — M810 Age-related osteoporosis without current pathological fracture: Secondary | ICD-10-CM | POA: Diagnosis not present

## 2014-11-30 DIAGNOSIS — B0082 Herpes simplex myelitis: Secondary | ICD-10-CM | POA: Diagnosis not present

## 2014-11-30 DIAGNOSIS — L578 Other skin changes due to chronic exposure to nonionizing radiation: Secondary | ICD-10-CM | POA: Diagnosis not present

## 2014-11-30 DIAGNOSIS — L820 Inflamed seborrheic keratosis: Secondary | ICD-10-CM | POA: Diagnosis not present

## 2014-11-30 DIAGNOSIS — L723 Sebaceous cyst: Secondary | ICD-10-CM | POA: Diagnosis not present

## 2014-11-30 DIAGNOSIS — D225 Melanocytic nevi of trunk: Secondary | ICD-10-CM | POA: Diagnosis not present

## 2014-11-30 DIAGNOSIS — L570 Actinic keratosis: Secondary | ICD-10-CM | POA: Diagnosis not present

## 2014-12-02 ENCOUNTER — Inpatient Hospital Stay: Payer: Medicare Other | Admitting: Internal Medicine

## 2014-12-02 ENCOUNTER — Emergency Department: Payer: Medicare Other

## 2014-12-02 ENCOUNTER — Inpatient Hospital Stay
Admission: EM | Admit: 2014-12-02 | Discharge: 2014-12-07 | DRG: 065 | Disposition: A | Discharge status: Home / Self Care | Payer: Medicare Other | Attending: Internal Medicine | Admitting: Internal Medicine

## 2014-12-02 DIAGNOSIS — I4891 Unspecified atrial fibrillation: Secondary | ICD-10-CM | POA: Diagnosis present

## 2014-12-02 DIAGNOSIS — J320 Chronic maxillary sinusitis: Secondary | ICD-10-CM | POA: Diagnosis present

## 2014-12-02 DIAGNOSIS — R000 Tachycardia, unspecified: Secondary | ICD-10-CM

## 2014-12-02 DIAGNOSIS — I471 Supraventricular tachycardia: Secondary | ICD-10-CM | POA: Diagnosis not present

## 2014-12-02 DIAGNOSIS — R202 Paresthesia of skin: Secondary | ICD-10-CM | POA: Diagnosis present

## 2014-12-02 DIAGNOSIS — I639 Cerebral infarction, unspecified: Secondary | ICD-10-CM

## 2014-12-02 DIAGNOSIS — R200 Anesthesia of skin: Secondary | ICD-10-CM | POA: Diagnosis present

## 2014-12-02 DIAGNOSIS — Z87891 Personal history of nicotine dependence: Secondary | ICD-10-CM

## 2014-12-02 DIAGNOSIS — R4701 Aphasia: Secondary | ICD-10-CM | POA: Diagnosis present

## 2014-12-02 DIAGNOSIS — Z7982 Long term (current) use of aspirin: Secondary | ICD-10-CM

## 2014-12-02 DIAGNOSIS — E785 Hyperlipidemia, unspecified: Secondary | ICD-10-CM | POA: Diagnosis present

## 2014-12-02 DIAGNOSIS — Z85820 Personal history of malignant melanoma of skin: Secondary | ICD-10-CM

## 2014-12-02 DIAGNOSIS — I63412 Cerebral infarction due to embolism of left middle cerebral artery: Principal | ICD-10-CM | POA: Diagnosis present

## 2014-12-02 DIAGNOSIS — Z853 Personal history of malignant neoplasm of breast: Secondary | ICD-10-CM

## 2014-12-02 DIAGNOSIS — J0100 Acute maxillary sinusitis, unspecified: Secondary | ICD-10-CM | POA: Diagnosis present

## 2014-12-02 DIAGNOSIS — R4781 Slurred speech: Secondary | ICD-10-CM | POA: Diagnosis not present

## 2014-12-02 DIAGNOSIS — I499 Cardiac arrhythmia, unspecified: Secondary | ICD-10-CM | POA: Diagnosis not present

## 2014-12-02 LAB — COMPREHENSIVE METABOLIC PANEL
ALT: 21 U/L (ref 0–55)
AST (SGOT): 20 U/L (ref 5–34)
Albumin/Globulin Ratio: 1.4 (ref 0.9–2.2)
Albumin: 4 g/dL (ref 3.5–5.0)
Alkaline Phosphatase: 79 U/L (ref 37–106)
Anion Gap: 8 (ref 5.0–15.0)
BUN: 18 mg/dL (ref 7–19)
Bilirubin, Total: 0.3 mg/dL (ref 0.2–1.2)
CO2: 29 mEq/L (ref 22–29)
Calcium: 9.6 mg/dL (ref 7.9–10.2)
Chloride: 101 mEq/L (ref 100–111)
Creatinine: 0.8 mg/dL (ref 0.6–1.0)
Globulin: 2.8 g/dL (ref 2.0–3.6)
Glucose: 121 mg/dL — ABNORMAL HIGH (ref 70–100)
Potassium: 3.7 mEq/L (ref 3.5–5.1)
Protein, Total: 6.8 g/dL (ref 6.0–8.3)
Sodium: 138 mEq/L (ref 136–145)

## 2014-12-02 LAB — PT/INR
PT INR: 1 (ref 0.9–1.1)
PT: 12.9 s (ref 12.6–15.0)

## 2014-12-02 LAB — APTT: PTT: 31 s (ref 23–37)

## 2014-12-02 LAB — CBC AND DIFFERENTIAL
Basophils Absolute Automated: 0.06 10*3/uL (ref 0.00–0.20)
Basophils Automated: 0 %
Eosinophils Absolute Automated: 0.15 10*3/uL (ref 0.00–0.70)
Eosinophils Automated: 1 %
Hematocrit: 42.3 % (ref 37.0–47.0)
Hgb: 13.9 g/dL (ref 12.0–16.0)
Immature Granulocytes Absolute: 0.05 10*3/uL
Immature Granulocytes: 0 %
Lymphocytes Absolute Automated: 2.34 10*3/uL (ref 0.50–4.40)
Lymphocytes Automated: 15 %
MCH: 30.4 pg (ref 28.0–32.0)
MCHC: 32.9 g/dL (ref 32.0–36.0)
MCV: 92.6 fL (ref 80.0–100.0)
MPV: 8.7 fL — ABNORMAL LOW (ref 9.4–12.3)
Monocytes Absolute Automated: 1.22 10*3/uL — ABNORMAL HIGH (ref 0.00–1.20)
Monocytes: 8 %
Neutrophils Absolute: 12.18 10*3/uL — ABNORMAL HIGH (ref 1.80–8.10)
Neutrophils: 76 %
Nucleated RBC: 0 /100 WBC (ref 0–1)
Platelets: 329 10*3/uL (ref 140–400)
RBC: 4.57 10*6/uL (ref 4.20–5.40)
RDW: 13 % (ref 12–15)
WBC: 15.95 10*3/uL — ABNORMAL HIGH (ref 3.50–10.80)

## 2014-12-02 LAB — TROPONIN I: Troponin I: <0.01 ng/mL (ref 0.00–0.09)

## 2014-12-02 LAB — CK: Creatine Kinase (CK): 93 U/L (ref 29–168)

## 2014-12-02 LAB — GLUCOSE WHOLE BLOOD - POCT: Whole Blood Glucose POCT: 128 mg/dL — ABNORMAL HIGH (ref 70–100)

## 2014-12-02 LAB — GFR: EGFR: >60

## 2014-12-02 MED ORDER — ASPIRIN 325 MG PO TABS
325.0000 mg | ORAL_TABLET | Freq: Every day | ORAL | Status: DC
Start: 2014-12-02 — End: 2014-12-02

## 2014-12-02 MED ORDER — ASPIRIN 325 MG PO TABS
323.0000 mg | ORAL_TABLET | Freq: Every day | ORAL | Status: DC
Start: 2014-12-03 — End: 2014-12-06
  Administered 2014-12-03 – 2014-12-05 (×3): 323 mg via ORAL
  Administered 2014-12-06: 325 mg via ORAL
  Filled 2014-12-02 (×4): qty 1
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
## 2014-12-02 NOTE — ED Notes (Signed)
CT notified.

## 2014-12-02 NOTE — ED Provider Notes (Signed)
Physician/Midlevel provider first contact with patient: 12/02/14 1922         History     Chief Complaint   Patient presents with   . Aphasia     The history is provided by the patient.   Timing: 0700  Aphasia  Quality: understands, but has difficulty word finding which comes and goes. 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 Remote h/o breast CA 10 years ago  Severity: moderate  Alleviating: took 3 baby ASA today  Pertinent negatives: denies HA    Past Medical History   Diagnosis Date   . Osteoporosis 2004   . Fatty liver    . Breast cancer 2006       Past Surgical History   Procedure Laterality Date   . Breast implants  1990   . Tonsilectomy, adenoidectomy, bilateral myringotomy and tubes       age 5   . Skin cancer excision       basal cell   . Melanoma  2006     left arm   . Breast lumpectomy  01/15/2005     right   . Sentinel node biopsy  2006   . Removal of breast implants  2008   . Breast biopsy  2006     lumpectomy   . Breast biopsy Left 01/2014     Benign.       Family History   Problem Relation Age of Onset   . Lung cancer Mother      smoker   . Pancreatic cancer Father    . Kidney cancer Brother    . Breast cancer Paternal Aunt      did not die from breast cancer   . Breast cancer Paternal Grandmother      did not die from breast cancer       Social  History   Substance Use Topics   . Smoking status: Former Smoker -- 0.30 packs/day for 4 years     Types: Cigarettes     Quit date: 03/31/1965   . Smokeless tobacco: Never Used   . Alcohol Use: No       .     No Known Allergies    Current/Home Medications    ASPIRIN 81 MG TABLET    Take 323 mg by mouth daily.    CALCIUM CITRATE (CALCITRATE) 950 MG TABLET    Take 1 tablet by mouth daily.    CALCIUM-VITAMIN D (OSCAL) 250-125 MG-UNIT PER TABLET    Take 1 tablet by mouth daily.    MULTIPLE VITAMINS-MINERALS (VITEYES AREDS FORMULA/LUTEIN PO)    Take 2 tablets by mouth daily.    RALOXIFENE (EVISTA) 60 MG  TABLET    Take 60 mg by mouth daily.        Review of Systems   Constitutional: Negative for fever.   HENT: Negative for congestion and sore throat.    Eyes: Negative for redness.   Respiratory: Negative for cough and shortness of breath.    Cardiovascular: Negative for chest pain.   Gastrointestinal: Negative for vomiting, abdominal pain and diarrhea.   Genitourinary: Negative for dysuria and hematuria.   Musculoskeletal: Negative for back pain.   Skin: Negative for rash.   Neurological: Positive for speech difficulty. Negative for syncope and headaches.   Hematological: Does not bruise/bleed easily.       Physical Exam    BP: 164/72 mmHg, Heart Rate: (!) 111,  Temp: 97.4 F (36.3 C), Resp Rate: 18, SpO2: 100 %, Weight: 61.78 kg    Physical Exam   Constitutional: She is oriented to person, place, and time. She appears well-developed. No distress.   HENT:   Nose: Nose normal.   Mouth/Throat: Oropharynx is clear and moist.   Eyes: Conjunctivae are normal. Pupils are equal, round, and reactive to light.   Neck: Neck supple.   Cardiovascular: Normal rate and regular rhythm.    Pulmonary/Chest: Effort normal and breath sounds normal.   Abdominal: Soft. There is no tenderness.   Lymphadenopathy:     She has no cervical adenopathy.   Neurological: She is alert and oriented to person, place, and time. Coordination normal.   Gross motor intact  Speech fluent and clearing, very rarely halting in search of wounds     Skin: Skin is warm. No rash noted.   Psychiatric: She has a normal mood and affect. Thought content normal.   Nursing note and vitals reviewed.        MDM and ED Course     ED Medication Orders     Start     Status Ordering Provider    12/02/14 2022  aspirin tablet 325 mg   Daily     Route: Oral  Ordered Dose: 325 mg     Ordered Josselin Gaulin, Mordecai Rasmussen              MDM  Number of Diagnoses or Management Options  Aphasia:   Diagnosis management comments: Keturah Barre MD, have been the primary provider for [name] during this Emergency Dept visit.    EKG Interpretation:    Rhythm:  Normal Sinus  Ectopy:  None  Rate:  Tachycardic  Conduction:  No blocks  ST Segments:  No acute ST segment changes  T Waves:  No acute T Wave changes  Axis:  Normal    Clinical Impression:  Abnormal, sinus tachycardia    Continuous Cardiac Monitor  HR: 110s bpm  Rhythm: sinus tach  Ectopy: none  Impression: abnormal    Continuous pulse oximetry  O2 sat: 100% RA  Impression: WNL  Intervention: obs    TPA not given secondary to: presenting outside of window, minimal variable deficit    Dx aphasia requiring admission for serial exam, further evaluation, risk stratification and coordination of care.          Amount and/or Complexity of Data Reviewed  Clinical lab tests: ordered and reviewed  Tests in the radiology section of CPT: ordered and reviewed  Discuss the patient with other providers: yes (D/w Dr. Francesco Sor, to c/s    D/w Dr. Isidor Holts, [facility], who accepts)           Procedures    Clinical Impression & Disposition     Clinical Impression  Final diagnoses:   Aphasia        ED Disposition     Observation Admitting Physician: Pearletha Forge [[id number]]  Diagnosis: Aphasia Agafon.Baumgarten.3.ICD-9-CM]  Estimated Length of Stay: < 2 midnights  Tentative Discharge Plan?: Home or Self Care [1]  Patient Class: Observation [104]             New Prescriptions    No medications on file                   Leidy Massar, Mordecai Rasmussen, MD  12/02/14 2136

## 2014-12-03 ENCOUNTER — Observation Stay: Payer: Medicare Other

## 2014-12-03 DIAGNOSIS — J0100 Acute maxillary sinusitis, unspecified: Secondary | ICD-10-CM | POA: Diagnosis not present

## 2014-12-03 DIAGNOSIS — I63412 Cerebral infarction due to embolism of left middle cerebral artery: Secondary | ICD-10-CM | POA: Diagnosis not present

## 2014-12-03 DIAGNOSIS — I6350 Cerebral infarction due to unspecified occlusion or stenosis of unspecified cerebral artery: Secondary | ICD-10-CM | POA: Diagnosis not present

## 2014-12-03 DIAGNOSIS — R4701 Aphasia: Secondary | ICD-10-CM | POA: Diagnosis not present

## 2014-12-03 LAB — LIPID PANEL
Cholesterol / HDL Ratio: 2.6
Cholesterol: 167 mg/dL (ref 0–199)
HDL: 65 mg/dL (ref >40)
LDL Calculated: 92 mg/dL (ref 0–99)
Triglycerides: 52 mg/dL (ref 34–149)
VLDL Calculated: 10 mg/dL (ref 10–40)

## 2014-12-03 LAB — HEMOLYSIS INDEX: Hemolysis Index: 5 (ref 0–18)

## 2014-12-03 LAB — HEMOGLOBIN A1C: Hemoglobin A1C: 5.6 % (ref 0.0–6.0)

## 2014-12-03 MED ORDER — ZOLPIDEM TARTRATE 5 MG PO TABS
5.0000 mg | ORAL_TABLET | Freq: Every evening | ORAL | Status: DC | PRN
Start: 2014-12-03 — End: 2014-12-07

## 2014-12-03 MED ORDER — GADOBUTROL 1 MMOL/ML IV SOLN
INTRAVENOUS | Status: AC
Start: 2014-12-03 — End: 2014-12-03
  Administered 2014-12-03: 10 mL via INTRAVENOUS
  Filled 2014-12-03: qty 10

## 2014-12-03 MED ORDER — ACETAMINOPHEN 325 MG PO TABS
650.0000 mg | ORAL_TABLET | ORAL | Status: DC | PRN
Start: 2014-12-03 — End: 2014-12-07
  Administered 2014-12-04 – 2014-12-05 (×2): 650 mg via ORAL
  Filled 2014-12-03 (×2): qty 2

## 2014-12-03 NOTE — OT Eval Note (Signed)
[facility]  [address]  [place], [place] [address]  [phone]    Occupational Therapy Evaluation    Patient: [name] [name] MRN: [id number]   Unit: [facility] INTERMEDIATE CARE Bed: MI627/MI627-01    Time of treatment: Time Calculation  OT Received On: 12/03/14  Start Time: 0940  Stop Time: 1000  Time Calculation (min): 20 min    Consult received for [name] for OT evaluation and treatment.  Patient's medical condition is appropriate for Occupational Therapy  intervention at this time.    D/C Suggestions   Home with supervision and outpatient OT for strength and coordination      Assessment   [name] is a 74 y.o. female admitted 12/02/2014. Pt's ability to complete ADLs and functional transfers is impaired due to the following deficits:  Impaired strength and coordination of R UE.  Pt would continue to benefit from OT to address these deficits and increase independence with ADLs and functional transfers.         Rehabilitation Potential: good    Plan   OT Plan  Risks/Benefits/POC Discussed with Pt/Family: With patient  Treatment Interventions: UE strengthening/ROM;Patient/Family training;Fine motor coordination activities  Discharge Recommendation: Home with outpatient OT;Home with supervision  DME Recommended for Discharge: Grab bars  OT Frequency Recommended: 1-2x/wk    Interdisciplinary Communication: RN and SLP notified of session outcome         Medical Diagnosis: Aphasia [R47.01]      History of Present Illness: [name] [name] is a 74 y.o. female admitted on 12/02/2014 with c/o speech impairments and R side weakness.  Pt reports weakness is R hands is not new and began shortly after shoveling snow 2-3 weeks ago.        Patient Active Problem List   Diagnosis   . Aphasia     Past Medical History   Diagnosis Date   . Osteoporosis 2004   . Fatty liver    . Breast cancer 2006     Past Surgical History   Procedure Laterality Date   . Breast implants  1990   . Tonsilectomy, adenoidectomy, bilateral  myringotomy and tubes       age 62   . Skin cancer excision       basal cell   . Melanoma  2006     left arm   . Breast lumpectomy  01/15/2005     right   . Sentinel node biopsy  2006   . Removal of breast implants  2008   . Breast biopsy  2006     lumpectomy   . Breast biopsy Left 01/2014     Benign.         X-Rays/Tests/Labs:  CT Head without Contrast [[id number]] (Order [id number]) on 12/02/14  IMPRESSION:   1. No CT evidence of acute hemorrhage.  2. Findings suggestive of chronic small vessel ischemic changes in  the white matter.  3. Acute on chronic right maxillary sinusitis    Social History:  Lives with husband in a multilevel house.  5 steps to enter without a rail.  All bedrooms on first floor and laundry in basement.    Equipment at home: none  Prior Level of Function:  Independent with basic ADLs, IADLs and ambulation.  She works as a Actor in elementary school.        Subjective   Patient is agreeable to participation in the therapy session. Nursing clears patient for therapy.  Patient's Goal:  Return  home at PLOF  Pain: 1/10  Location: frontal headache  Therapist Intervention: repositioned; offered to turn lights off and pt declined  Patient is satisfied with therapist intervention.    Objective     Precautions: fall    Patient is in bed with  Telemetry and Intravenous Access  in place.     Observation of patient/vitals:   Filed Vitals:    12/02/14 2123 12/03/14 0000 12/03/14 0400 12/03/14 0818   BP: 141/69 128/61 124/64 128/61   Pulse: 95 86 70 91   Temp: 97 F (36.1 C) 97.5 F (36.4 C) 97.2 F (36.2 C) 97.5 F (36.4 C)   TempSrc: Axillary Oral Oral Tympanic   Resp: 18 18 18 20    Height: 1.676 m (5\' 6" )      Weight: 61.236 kg (135 lb)      SpO2: 96% 97% 96% 100%       Orientation/Cognition:     Alert and Oriented x 4  Cognition: follows directions appropriately, word finding deficits during conversation    Musculoskeletal Examination:          ROM Strength   RUE WFL 3+/5   LUE  WFL  4/5     Sensation: WFL B UE  Coordination: FMC slightly impaired however functional  Vision: WFL with glasses  Hearing: WFL    Functional Mobility:  Rolling: independent    Scooting: independent  Supine to sit: independent  Sit to Supine: independent  Sit to stand: supervision  Stand to sit: supervision  Transfers: supervision bed to toilet without AD    Balance:  Static Sit Balance: good  Dynamic Sit Balance: good  Static Stand Balance: good  Dynamic Stand Balance: fair+    Self Care:  Eating: independent seated EOB  Grooming: supervision standing at sink  Bathing: supervision standing at sink  UB Dressing: independent seated EOB  LB Dressing: supervision standing/sitting EOB  Toileting: supervision on toilet    Endurance: good    Participation:  good    Education:  Educated the patient to the role of occupational therapy, plan of care, goals  of therapy and HEP, safety with mobility and ADLs, home safety.         Patient is in bed with Telemetry, Intravenous Access and Sequential Compression Device (SCD), and call bell within reach. RN notified of session outcome.  Mobility and ADL status posted at bedside.    G codes:     AM-PACT "6 Clicks" Daily Activity Inpatient Short Form  Inpatient AM-PACT Performed?: yes  Put On/Take Off Lower Body Clothing: A little  Assist with Bathing: None  Assist with Toileting: None  Put On/Take Off Upper Body Clothing: None  Assist with Grooming: None  Assist with Eating: None  OT Daily Activity Raw Score: 23  CMS 0-100% Score: 15.86%   Function-Related G Codes  Function Related G Code: Self Care               Self Care G Code Set  Self Care, Current Status (Z6109): At least 1 percent but less than 20 percent impaired, limited or restricted  Self Care, Goal Status (U0454): 0 percent impaired, limited or restricted  Tools used to determine level of impairment: AM-PACT            Patient's current impairment level is: Self Care, Current Status (U9811): CI   Patient is expected to  achieve: Self Care, Goal Status (B1478): [place]   Patient discharge impairments level is:  Therapy Diagnosis: generalized weakness  Rehabilitation Potential: good    Goals:  Goals  Goal Formulation: Patient  Time For Goal Achievement: 3 visits    Musculoskeletal Goals  Pt will perform Home Exercise Program: modified independent (to increase indepenence with IADLs and work related tasks)  Pt will demonstrate increase of strength: to 4+/5 (to increase indepenence with IADLs and work related tasks)    Signature:   Darci Needle, OT  12/03/2014  10:33 AM  Phone: [phone]    Attention MD:   Thank you for allowing [place] to participate in the care of [name]. Regulations from the Center for Medicare and Medicaid Services (CMS) require your review and approval of this plan of care.     Please cosign this note indicating you are in agreement with the OT Plan of Care so we may initiate the therapy treatment plan

## 2014-12-03 NOTE — SLP Eval Note (Cosign Needed)
[facility]  [address]  [place], [place] [address]  [phone]    SPEECH LANGUAGE COGNITIVE SWALLOW EVALUATION    Patient: [name] [name]    MRN#: [id number]    Visit Number: 0/5    Consult received for [name] [name] for SLP Evaluation and Treatment.    Referring Physician: Dr. Isidor Holts    Date of Referral: 12/03/2014      Date/Time of Evaluation:   Time Calculation  SLP Received On: 12/03/14  Start Time: 0830  Stop Time: 0930  Time Calculation (min): 60 min  Total Treatment Time (min): 60    Assessment and Clinical Impression   [name] [name] is a 74 y.o. female admitted 12/02/2014 for  Aphasia [R47.01] presenting with moderate expressive language and mild- mod cognitive-linguistic deficits, c/b decreased word retrieval, decreased processing for lengthier, more complex written information, decreased attention to detail, self-monitoring, working memory, organization and higher level problem solving. She shows adequate auditory comprehension for mod complex yes/no questions, simple 2 step directions, and conversation. She shows good simple verbal problem solving. She has difficulty expressing simple ideas in conversation due to decreased word retrieval. Excellent repetition of words and sentences. No apraxia noted. Pt is aware of her deficits. Swallow is [facility] for all consistencies. No s/s of aspiration noted.    Plan   Individual and group SLP therapy 5 days per week for 2 week(s).  Conduct patient and family education.  Provided pt with outpt slp number to schedule out pt therapy.  Interdisciplinary Communication: Discussed with Dr. Isidor Holts and OT.         Medical Diagnosis: Aphasia [R47.01]    History of Present Illness:   [name] [name] is a 74 y.o. female admitted on  12/02/2014 with Aphasia [R47.01]    Past Medical/Surgical History:  Past Medical History   Diagnosis Date   . Osteoporosis 2004   . Fatty liver    . Breast cancer 2006     Past Surgical History   Procedure Laterality Date   . Breast implants  1990    . Tonsilectomy, adenoidectomy, bilateral myringotomy and tubes       age 37   . Skin cancer excision       basal cell   . Melanoma  2006     left arm   . Breast lumpectomy  01/15/2005     right   . Sentinel node biopsy  2006   . Removal of breast implants  2008   . Breast biopsy  2006     lumpectomy   . Breast biopsy Left 01/2014     Benign.       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    Subjective   Patient is agreeable to participation in the therapy session. Patient's medical condition is  appropriate for Speech therapy intervention at this time.  Patient's Goal:  To improve word retrieval and thinking.        Precautions: Safety    Objective   Completed swallow and SLP evaluation with formal and informal measures. RIPA-Problem Solving and Abstract Reasoning-100%, Mary's Schedule, very slow, mod errors, not able to complete, aware of difficulty    Educated the patient to role of speech therapy, plan of care, goals of  therapy.    G Codes:  Swallowing G Code Set  Swallowing, Current Status (Z6109): CH  Swallowing, Goal Status (U0454): CH  Swallowing, D/C Status (U9811): CH                     Spoken Language Expressive G Code Set  Spoken Language Expression, Current Status (B1478): CK  Spoken Language Expression, Goal Status (G9562): CH  Tools used to determine level of impairment::  (clinical judgment)                    Goals: Patient will express needs, wants, and ideas at mod level with min-0 assistance/cues.  Patient will use strategies to increase processing, attention to detail and self-monitoring for improved reading, organization, problem solving for completion of household and work related activities with min-0 cues.      Attention Dr. Isidor Holts  Thank you for allowing [place] to participate in the care of your patient.  Regulations from the Center for Medicare and Medicaid Services (CMS) require your review and approval of this plan of care.     Please cosign the SLP Eval Note indicating you are in agreement with the SLP Plan of Care so we may initiate the therapy treatment plan.    Signature:Frani Axil Copeman, M.S. CCC-SLP

## 2014-12-03 NOTE — Plan of Care (Signed)
Problem: Safety  Goal: Patient will be free from injury during hospitalization  Outcome: Progressing  Pt is alert, oriented x4 - VSS - in SR on tele- reviewed w/pt falls precautions, call light system - bed in low, locked position, call  light and belongings in reach- rounding and safe environment provided     Problem: Pain  Goal: Patient's pain/discomfort is manageable  Outcome: Progressing  Denies pain will monitor    Problem: Day of Admission- Stroke  Goal: Neurological status is stable or improving  Outcome: Progressing  Pt reports continuing episodes of word find difficulty - episodes last less than - have not heard slurring of speech since admission to floor   Intervention: Monitor/assess Glasgow Coma Scale.  Glasgow Coma Scale is 15   Intervention: Monitor/assess Complex Neurological Assessment  Pt does not show any neurological deficits -will continue to monitor  Intervention: Monitor/assess NIH Stroke Scale.  NIH scale is 0 will monitor as ordered   Intervention: Observe for seizure activity and initiate seizure precautions if indicated.  No seizure activity noted -will monitor    Goal: Stable vital signs and fluid balance  Outcome: Progressing  VSS - in SR on tele -denies pain   Intervention: Position patient for maximum circulation / cardiac output.  Pt is able to turn and position self -skin is intact except for small areas on chest and blister on Lt 1st finger knuckle d/t recent Derm work     Goal: Mobility/activity is maintained at optimum level for patient  Outcome: Progressing  Pt is mobile and able to ambulate w/sb assist - gait is steady and sure -   Goal: Neurovascular Status is Stable or Improving  Outcome: Progressing  neurovascular status is stable - no deficits noted   Intervention: Monitor/assess for signs of VTE (Edema of calf/thigh redness, pain)  SCDs on     Goal: Will be able to express needs and understand communication  Outcome: Progressing  Is able to express needs - no word find  issues noted so far this shift     Comments:   Received pt from ED to Rm 627-1, accompanied by spouse - oriented to room and call light system- reviewed safety protocols w/pt - pt is neurologically intact at time of admission w/no deficits noted - pt reports the slurred speech and word find problems last for a very short time approx 1-32min and are intermittent - placed on tele - is in SR-ST - denies pain -skin intact - safe environment provided - informed pt of frequent monitoring and she verbalized understanding -     .Marland KitchenThe patient and caregiver's learning abilities have been assessed. Today's individualized plan of care includes a neurology consult and possibly further testing such as an MRI and carotid dopplers, labs and q4hr neuro evals . The plan of care was discussed with the patient and caregiver, who agree to it and demonstrate understanding of the disease process, treatment plan, medications and consequences of noncompliance. All questions and concerns were addressed.

## 2014-12-03 NOTE — H&P (Signed)
ADMISSION HISTORY AND PHYSICAL EXAM    Date Time: 12/03/2014 8:28 AM  Patient Name: [name] [name]  Attending Physician: Pearletha Forge, MD    Assessment:   Speech deficit - rule out CVA    Patient Active Problem List   Diagnosis   . Aphasia       Plan:   Admitted to [facility]. Neuro checks. Started on ASA. Lipid panel, HgA1C. Neurology consult. MRI/MRA ordered. Full code. Cardiac diet. ST/OT/PT ordered. SCDs for DVT prophylaxis.     History of Present Illness:   [name] [name] is a 74 y.o. 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 Yesterday morning had issues getting words out during an argument with husband, then at work had similar problem during a phone call. In PM when talking to son over phone, had the issue again. Work up in ED is unremarkable. She denies any weakness, numbness, hearing deficits, or vision changes. She has been dealing with a sinus infection.      Past Medical History:     Past Medical History   Diagnosis Date   . Osteoporosis 2004   . Fatty liver    . Breast cancer 2006       Past Surgical History:     Past Surgical History   Procedure Laterality Date   . Breast implants  1990   . Tonsilectomy, adenoidectomy, bilateral myringotomy and tubes       age 68   . Skin cancer excision       basal cell   . Melanoma  2006     left arm   . Breast lumpectomy  01/15/2005     right   . Sentinel node biopsy  2006   . Removal of breast implants  2008   . Breast biopsy  2006     lumpectomy   . Breast biopsy Left 01/2014     Benign.       Family History:     Family History   Problem Relation Age of Onset   . Lung cancer Mother      smoker   . Pancreatic cancer Father    . Kidney cancer Brother    . Breast cancer Paternal Aunt      did not die from breast cancer   . Breast cancer Paternal Grandmother      did not die from breast cancer       Social History:     History     Social History   . Marital Status: Married     Spouse Name: N/A     Number of Children: N/A   . Years of  Education: N/A     Social History Main Topics   . Smoking status: Former Smoker -- 0.30 packs/day for 4 years     Types: Cigarettes     Quit date: 03/31/1965   . Smokeless tobacco: Never Used   . Alcohol Use: No   . Drug Use: No   . Sexual Activity: Not on file     Other Topics Concern   . Not on file     Social History Narrative       Allergies:   No Known Allergies    Medications:     Prescriptions prior to admission   Medication Sig   . aspirin 81 MG tablet Take 323 mg by mouth daily.   . calcium citrate (CALCITRATE) 950 MG tablet Take 1 tablet by mouth daily.   Marland Kitchen  calcium-vitamin D (OSCAL) 250-125 MG-UNIT per tablet Take 1 tablet by mouth daily.   . Multiple Vitamins-Minerals (VITEYES AREDS FORMULA/LUTEIN PO) Take 2 tablets by mouth 2 (two) times daily.      . raloxifene (EVISTA) 60 MG tablet Take 60 mg by mouth daily.       Review of Systems:   A comprehensive review of systems was: History obtained from the patient  General ROS: negative  Psychological ROS: negative  Respiratory ROS: no cough, shortness of breath, or wheezing  Cardiovascular ROS: no chest pain or dyspnea on exertion  Gastrointestinal ROS: no abdominal pain, change in bowel habits, or black or bloody stools  Genito-Urinary ROS: no dysuria, trouble voiding, or hematuria  Musculoskeletal ROS: negative  Neurological ROS: positive for - headaches and speech problems  Dermatological ROS: positive for eczema and skin lesion changes    Physical Exam:     Filed Vitals:    12/03/14 0400   BP: 124/64   Pulse: 70   Temp: 97.2 F (36.2 C)   Resp: 18   SpO2: 96%       Intake and Output Summary (Last 24 hours) at Date Time  No intake or output data in the 24 hours ending 12/03/14 0828    General appearance - alert, well appearing, and in no distress  Mental status - alert, oriented to person, place, and time  Eyes - pupils equal and reactive, extraocular eye movements intact  Mouth - mucous membranes moist, pharynx normal without lesions  Neck - supple, no  significant adenopathy  Chest - clear to auscultation, no wheezes, rales or rhonchi, symmetric air entry  Heart - normal rate, regular rhythm, normal S1, S2, no murmurs, rubs, clicks or gallops  Abdomen - soft, nontender, nondistended, no masses or organomegaly  Neurological - alert, oriented, normal speech, no focal findings or movement disorder noted  Extremities - peripheral pulses normal, no pedal edema, no clubbing or cyanosis    Labs:     Results     Procedure Component Value Units Date/Time    Hemoglobin A1C [[id number]] Collected:  12/03/14 0532    Specimen Information:  Blood Updated:  12/03/14 0730    Lipid panel [[id number]] Collected:  12/03/14 0532    Specimen Information:  Blood Updated:  12/03/14 0532    Narrative:      Fasting specimen    Troponin I [[id number]] Collected:  12/02/14 1941    Specimen Information:  Blood Updated:  12/02/14 2028     Troponin I <0.01 ng/mL     Comprehensive metabolic panel [[id number]]  (Abnormal) Collected:  12/02/14 1941    Specimen Information:  Blood Updated:  12/02/14 2010     Glucose 121 (H) mg/dL      BUN 18 mg/dL      Creatinine 0.8 mg/dL      Sodium 629 mEq/L      Potassium 3.7 mEq/L      Chloride 101 mEq/L      CO2 29 mEq/L      CALCIUM 9.6 mg/dL      Protein, Total 6.8 g/dL      Albumin 4.0 g/dL      AST (SGOT) 20 U/L      ALT 21 U/L      Alkaline Phosphatase 79 U/L      Bilirubin, Total 0.3 mg/dL      Globulin 2.8 g/dL      Albumin/Globulin Ratio 1.4      Anion Gap 8.0  GFR [[id number]] Collected:  12/02/14 1941     EGFR >60.0 Updated:  12/02/14 2010    Creatine Kinase (CK) [[id number]] Collected:  12/02/14 1941    Specimen Information:  Blood Updated:  12/02/14 2009     Creatine Kinase (CK) 93 U/L     APTT [[id number]] Collected:  12/02/14 1941     PTT 31 sec Updated:  12/02/14 2009    Protime-INR [[id number]] Collected:  12/02/14 1941    Specimen Information:  Blood Updated:  12/02/14 2009     PT 12.9 sec      PT INR 1.0      PT Anticoag. Given Within 48 hrs.  None     CBC with differential [[id number]]  (Abnormal) Collected:  12/02/14 1941    Specimen Information:  Blood / Blood Updated:  12/02/14 1948     WBC 15.95 (H) x10 3/uL      Hgb 13.9 g/dL      Hematocrit 87.5 %      Platelets 329 x10 3/uL      RBC 4.57 x10 6/uL      MCV 92.6 fL      MCH 30.4 pg      MCHC 32.9 g/dL      RDW 13 %      MPV 8.7 (L) fL      Neutrophils 76 %      Lymphocytes Automated 15 %      Monocytes 8 %      Eosinophils Automated 1 %      Basophils Automated 0 %      Immature Granulocyte 0 %      Nucleated RBC 0 /100 WBC      Neutrophils Absolute 12.18 (H) x10 3/uL      Abs Lymph Automated 2.34 x10 3/uL      Abs Mono Automated 1.22 (H) x10 3/uL      Abs Eos Automated 0.15 x10 3/uL      Absolute Baso Automated 0.06 x10 3/uL      Absolute Immature Granulocyte 0.05 x10 3/uL     Glucose Whole Blood - POCT [[id number]]  (Abnormal) Collected:  12/02/14 1923     POCT - Glucose Whole blood 128 (H) mg/dL Updated:  84/16/60 6301          EKG: nonspecific ST and T waves changes, sinus tachycardia.      Rads:   Radiological Procedure reviewed.  Radiology Results (24 Hour)     Procedure Component Value Units Date/Time    Chest AP Portable [[id number]] Collected:  12/02/14 2136    Order Status:  Completed Updated:  12/02/14 2141    Narrative:      HISTORY: Intermittent slurred speech.    EXAMINATION: Portable frontal view of chest at 2008    COMPARISON: 02/06/2005     FINDINGS:     No focal consolidation or effusions. No acute edema.  Cardiac silhouette not enlarged. Pleural surfaces and osseous structures  are intact.      Impression:       No radiographic evidence of acute cardiopulmonary process.    Ivin Poot, MD   12/02/2014 9:37 PM      CT Head without Contrast [[id number]] Collected:  12/02/14 1957    Order Status:  Completed Updated:  12/02/14 2002    Narrative:      HISTORY: Difficulty with speech.    EXAMINATION AND TECHNIQUE: Unenhanced CT examination of brain performed.      Note that  CT scanning at  this site utilizes multiple dose reduction  techniques including automatic exposure control, adjustment of the MAS  and/or KVP according to patient size, and use of iterative  reconstruction technique.    COMPARISON: None available    FINDINGS: No mass-effect or midline shift on these unenhanced images.  Ventricular size within normal limits. No CT evidence of acute  intra-axial fluid collections to suggest acute hemorrhage. There are  stable periventricular white matter lucencies bilaterally suggestive of      chronic small vessel ischemic changes. Osseous structures are intact.  The visualized paranasal sinuses exhibit mucosal thickening in the  maxillary sinuses bilaterally with a moderate-sized air-fluid level in  the right maxillary sinus.       Impression:         1. No CT evidence of acute hemorrhage.  2. Findings suggestive of     chronic small vessel ischemic changes in  the white matter.  3. Acute on chronic right maxillary sinusitis.    Ivin Poot, MD   12/02/2014 7:58 PM            Signed by: Pearletha Forge, MD

## 2014-12-03 NOTE — PT Eval Note (Signed)
[facility]  [address]  [place], [place] [address]  [phone]    Physical Therapy Evaluation    Patient: [name] [name] MRN: [id number]   Unit: [facility] INTERMEDIATE CARE Bed: MI627/MI627-01    Time of Treatment: Time Calculation  PT Received On: 12/03/14  Start Time: 1130  Stop Time: 1200  Time Calculation (min): 30 min    Consult received for [name] for PT evaluation and treatment.  Patient's medical condition is appropriate for Physical Therapy  intervention at this time.    Medical Diagnosis: Aphasia [R47.01]         History of Present Illness: [name] [name] is a 74 y.o. female admitted on  12/02/2014 with speech problem. Yesterday morning had issues getting words out during an argument with husband, then at work had similar problem during a phone call. In PM when talking to son over phone, had the issue again. Work up in ED is unremarkable. She denies any weakness, numbness, hearing deficits, or vision changes. She has been dealing with a sinus infection.       Patient Active Problem List   Diagnosis   . Aphasia     Past Medical History   Diagnosis Date   . Osteoporosis 2004   . Fatty liver    . Breast cancer 2006     Past Surgical History   Procedure Laterality Date   . Breast implants  1990   . Tonsilectomy, adenoidectomy, bilateral myringotomy and tubes       age 66   . Skin cancer excision       basal cell   . Melanoma  2006     left arm   . Breast lumpectomy  01/15/2005     right   . Sentinel node biopsy  2006   . Removal of breast implants  2008   . Breast biopsy  2006     lumpectomy   . Breast biopsy Left 01/2014     Benign.       X-Rays/Tests/Labs:  CT Head without Contrast [[id number]] (Order [id number]) on 12/02/14  IMPRESSION:   1. No CT evidence of acute hemorrhage.  2. Findings suggestive of chronic small vessel ischemic changes in  the white matter.  3. Acute on chronic right maxillary sinusitis    Precautions: fall    Social History:  Lives with husband in a multilevel house. 5 steps to enter  without a rail. All bedrooms on first floor and laundry in basement.   Equipment at home: none  Prior Level of Function: Independent with basic ADLs, IADLs and ambulation. She works as a Actor in elementary school.  Subjective: Patient is agreeable to participation in the therapy session. Nursing clears patient for therapy.  Patient's Goal:  Go home  Pain: 0/10    Objective:   Patient is in bed with  Telemetry and Intravenous Access in place.    Observation of patient/vitals:  Filed Vitals:    12/03/14 0000 12/03/14 0400 12/03/14 0818 12/03/14 1214   BP: 128/61 124/64 128/61 129/62   Pulse: 86 70 91    Temp: 97.5 F (36.4 C) 97.2 F (36.2 C) 97.5 F (36.4 C) 97.3 F (36.3 C)   TempSrc: Oral Oral Tympanic Oral   Resp: 18 18 20 20    Height:       Weight:       SpO2: 97% 96% 100% 97%       Orientation/Cognition:  Alert and Oriented x 4  Cognition: follows all directions, able to answer questions but at times pauses    Musculoskeletal Examination:      ROM Strength   Neck/ Trunk WFL WFL   RLE WFL 4/5   LLE WFL 4+/5     Sensation: intact for light touch to BLE  Coordination: mildly impairment noted on right side however, still at functional level    Functional Mobility:  Rolling: independent   Supine to sit: indpendent  Scooting: independent  Sit to Supine: independent  Sit to stand: supervision  Stand to sit: supervision  Transfers: supervision    Ambulation:     Weightbearing: FWB   Assistance level: supervision   Distance: 300'   Assistive Device: none   Gait Deviations: good cadence, decreased heel strike right   Stairs: 1 flight with rail alternating feet with CGA    Balance:  Static Sit: good  Dynamic Sit: good  Static Stand: good  Dynamic Stand: good-    Endurance: good    Participation:  good    Assessment:  [name] [name] is a 74 y.o. female admitted 12/02/2014.  Pt's functional mobility is impaired due to the following deficits:  Mild right sided strength deficit and coordination  on right side.  Pt would continue to benefit from PT to address these deficits and increase functional independence.     Plan:   Plan  Risks/Benefits/POC Discussed with Pt/Family: With patient/family  Treatment/Interventions: Exercise;Gait training;Neuromuscular re-education;Patient/family training  PT Frequency: 2-3x/wk    Education:  Educated the patient to role of physical therapy, plan of care, goals  of therapy and safety with mobility and ADLs.        G codes:        Mobility G Code Set  Mobility, Current Status [phone]): At least 1 percent but less than 20 percent impaired, limited or restricted  Mobility, Goal Status (U0454): 0 percent impaired, limited or restricted  Tools used to determine level of impairment: AM-PACT "6 Clicks" Basic Mobility Score (clinical judgement)                  Patient's current impairment level is: Mobility, Current Status [id number]): CI    Patient is expected to achieve: Mobility, Goal Status (B1478): CH    Patient discharge impairments level is:       Therapy Diagnosis: Impaired Mobility    Rehabilitation Potential: good        Goals  Goal Formulation: With patient  Time for Goal Acheivement: 2 visits  Goals: Select goal  Pt Will Ambulate: > 200 feet;independent;to maximize functional mobility and independence  Pt Will Go Up / Down Stairs: 1 flight;independent;to maximize functional mobility and independence  Other Goal: RLE strength 5/5    Patient is in bed with Telemetry and Intravenous Access, and call bell within reach. RN notified of session outcome.  Mobility status posted at bedside and within E.M.R.    D/C Suggestions:  Recommendation  Discharge Recommendation: Home with outpatient PT  PT Frequency: 2-3x/wk      Signature:  Emi Belfast, PT  12/03/2014  4:29 PM  Phone: [phone]    Attention MD:   Thank you for allowing [place] to participate in the care of [name]. Regulations from the [employer] for Medicare and Medicaid Services (CMS) require your review and approval of this  plan of care.     Please cosign this note indicating you are in agreement with the PT Plan of Care so we may initiate the therapy treatment  plan

## 2014-12-03 NOTE — Consults (Signed)
NEUROLOGY CONSULTATION    Date Time: 12/03/2014 12:44 PM  Patient Name: [name] [name]  Attending Physician: Pearletha Forge, MD      Assessment & Plan:   Sudden onset of speech disturbance, suggestive of expressive aphasia.  Still ongoing, somewhat better, though.--Likely due to left MCA distribution stroke  Prior history of breast cancer.      Proceed with MRI, MRA, echocardiogram, lipid panel.  Aspirin on empiric basis.  PT, OT evaluations.  Will follow    History of Present Illness:   Patient is a 74 year old lady, who had a sudden onset of speech disturbance that started yesterday morning.  She reached the hospital.  In the evening time.  Given time of onset was almost a full day, she is not considered a candidate for any kind of intervention.  She had symptoms suggestive of expressive aphasia, word finding difficulty and hesitancy.  There is no report of any one-sided weakness and slurred speech, but family does believe that there are some occasions of slurring of her words.    There is no one-sided weakness per se, patient reports numbness and tingling in her right hand and arm, ongoing for several months.    Past Medical History:     Past Medical History   Diagnosis Date   . Osteoporosis 2004   . Fatty liver    . Breast cancer 2006       Meds:      Scheduled Meds: PRN Meds:      aspirin 323 mg Oral Daily       Continuous Infusions:      acetaminophen 650 mg Q4H PRN   zolpidem 5 mg QHS PRN         I personally reviewed all of the medications    No Known Allergies    Social & Family History:     History     Social History   . Marital Status: Married     Spouse Name: N/A     Number of Children: N/A   . Years of Education: N/A     Occupational History   . Not on file.     Social History Main Topics   . Smoking status: Former Smoker -- 0.30 packs/day for 4 years     Types: Cigarettes     Quit date: 03/31/1965   . Smokeless tobacco: Never Used   . Alcohol Use: No   . Drug Use: No   . Sexual Activity: Not on file      Other Topics Concern   . Not on file     Social History Narrative     Family History   Problem Relation Age of Onset   . Lung cancer Mother      smoker   . Pancreatic cancer Father    . Kidney cancer Brother    . Breast cancer Paternal Aunt      did not die from breast cancer   . Breast cancer Paternal Grandmother      did not die from breast cancer       Review of Systems:   No headache, eye, ear nose, throat problems; no coughing or wheezing or shortness of breath, No chest pain or orthopnea, no abdominal pain, nausea or vomiting, No pain in the body or extremities, no psychiatric, neurological, endocrine, hematological or cardiac complaints except as noted above.     Physical Exam:   Blood pressure 129/62, pulse 91, temperature 97.3 F (36.3 C), temperature source  Oral, resp. rate 20, height 1.676 m (5\' 6" ), weight 61.236 kg (135 lb), last menstrual period 05/19/1992, SpO2 97 %.    HEENT: Normocephalic. Non-icter, no congestion, no carotid bruits  Lungs:  CTA bil  Abdomen soft and nontender  Cardiac:  S1,S2, normal rate and rhythm  Neck: supple, no lymphadenopathy, no thyromegaly, no JVD, no cartoid bruits  Extremities: no clubbing, cyanosis, or edema  Skin: no rashes or lesions noted    Neuro:  Level of consciousness:  Alert and appropriate  Oriented:  X 3  Cognition:  Intact naming, recognition, concentration and following complex commands, but she had very minimal word finding difficulty, and some hesitancy and speaking, did not seem too impressive to me, but family thought was significantly abnormal  Cranial Nerves:  II-XII intact  Strength: Very mild pronation in the right upper extremities, but no obvious frank weakness noted.  Fine finger movements performed well too  Coordination:  Intact FTN testing  Reflexes:  +2 throughout, down going toes bil  Sensation: Intact x 4 extremities to LT,   Gait:  Deferred     Labs:     Recent Labs  Lab 12/02/14  1941   GLUCOSE 121*   BUN 18   CREATININE 0.8   CALCIUM  9.6   SODIUM 138   POTASSIUM 3.7   CHLORIDE 101   CO2 29   ALBUMIN 4.0   AST (SGOT) 20   ALT 21   BILIRUBIN, TOTAL 0.3   ALKALINE PHOSPHATASE 79       Recent Labs  Lab 12/02/14  1941   WBC 15.95*   HEMOGLOBIN 13.9   HEMATOCRIT 42.3   MCV 92.6   MCH, POC 30.4   MCHC 32.9   PLATELETS 329         Recent Labs      12/02/14   1941   PTT  31   PT  12.9   PT INR  1.0          Radiology Results (24 Hour)     Procedure Component Value Units Date/Time    Chest AP Portable [[id number]] Collected:  12/02/14 2136    Order Status:  Completed Updated:  12/02/14 2141    Narrative:      HISTORY: Intermittent slurred speech.    EXAMINATION: Portable frontal view of chest at 2008    COMPARISON: 02/06/2005     FINDINGS:     No focal consolidation or effusions. No acute edema.  Cardiac silhouette not enlarged. Pleural surfaces and osseous structures  are intact.      Impression:       No radiographic evidence of acute cardiopulmonary process.    Ivin Poot, MD   12/02/2014 9:37 PM      CT Head without Contrast [[id number]] Collected:  12/02/14 1957    Order Status:  Completed Updated:  12/02/14 2002    Narrative:      HISTORY: Difficulty with speech.    EXAMINATION AND TECHNIQUE: Unenhanced CT examination of brain performed.      Note that CT scanning at this site utilizes multiple dose reduction  techniques including automatic exposure control, adjustment of the MAS  and/or KVP according to patient size, and use of iterative  reconstruction technique.    COMPARISON: None available    FINDINGS: No mass-effect or midline shift on these unenhanced images.  Ventricular size within normal limits. No CT evidence of acute  intra-axial fluid collections to suggest acute hemorrhage. There are  stable periventricular white matter lucencies bilaterally suggestive of      chronic small vessel ischemic changes. Osseous structures are intact.  The visualized paranasal sinuses exhibit mucosal thickening in the  maxillary sinuses bilaterally with a  moderate-sized air-fluid level in  the right maxillary sinus.       Impression:         1. No CT evidence of acute hemorrhage.  2. Findings suggestive of     chronic small vessel ischemic changes in  the white matter.  3. Acute on chronic right maxillary sinusitis.    Ivin Poot, MD   12/02/2014 7:58 PM             All recent brain and spine imaging (MRI, CT) personally reviewed.    Chart reviewed    Case discussed with: pt and family and speech pathology and ED attending regarding decision making process, for IV TPA administration, or  further neuroimaging    Signed by: Cathe Mons, MD  Spectralink: [phone]      Answering Service: [phone]

## 2014-12-03 NOTE — Plan of Care (Signed)
Problem: Day 2- Stroke  Goal: Neurological status is stable or improving  Outcome: Progressing  Neuro checks done every 4 hrs.   Goal: Stable vital signs and fluid balance  Outcome: Progressing  Goal: Patient will maintain Adequate Oxygenation  Outcome: Progressing  Goal: Patients risk of aspiration will be minimized  Outcome: Progressing  Goal: Nutritional Status Improving  Outcome: Progressing  Goal: Elimination patterns are normal or improving  Outcome: Progressing  Goal: Mobility/activity is maintained at optimum level for patient  Outcome: Progressing  Goal: Skin integrity is maintained or improved  Outcome: Progressing  Goal: Neurovascular Status is Stable or Improving  Outcome: Progressing  Goal: Effective coping demonstrated  Outcome: Progressing  Goal: Will be able to express needs and understand communication  Outcome: Progressing  Goal: Patient/Patient Care Companion demonstrates understanding on disease process, treatment plan, medications,discharge plan, and consequences of noncompliance  Outcome: Progressing    Comments:   Patient is alert and oriented x 4. Pupils are equal and responsive also delayed verbal responses are noted, no slurred speech or aphasia. Neuro checks done every 4 hrs. Patient passes the Dysphagia screening and had a good appetite and had meals at proper times. Grib strength is unequal, slightly weak on the right, but patient states this is not nuew. Bed alarm was on at all times. Patient had ST, PT and OT today.   Education about stroke and MRI test explained to patient and son, who understood and all their questions were answered. Plan of care reviewed with patient and family, verbalized understanding    The patient and caregiver's learning abilities have been assessed. Today's individualized plan of care includes Stroke handout and MRI test. The plan of care was discussed with the patient and caregiver, who agree to it and demonstrate understanding of the disease process,  treatment plan, medications and consequences of noncompliance. All questions and concerns were addressed.

## 2014-12-04 ENCOUNTER — Inpatient Hospital Stay: Payer: Medicare Other

## 2014-12-04 DIAGNOSIS — I639 Cerebral infarction, unspecified: Secondary | ICD-10-CM

## 2014-12-04 DIAGNOSIS — R4701 Aphasia: Secondary | ICD-10-CM

## 2014-12-04 DIAGNOSIS — Z853 Personal history of malignant neoplasm of breast: Secondary | ICD-10-CM | POA: Diagnosis not present

## 2014-12-04 DIAGNOSIS — E785 Hyperlipidemia, unspecified: Secondary | ICD-10-CM | POA: Diagnosis present

## 2014-12-04 DIAGNOSIS — Z7982 Long term (current) use of aspirin: Secondary | ICD-10-CM | POA: Diagnosis not present

## 2014-12-04 DIAGNOSIS — J320 Chronic maxillary sinusitis: Secondary | ICD-10-CM | POA: Diagnosis present

## 2014-12-04 DIAGNOSIS — R202 Paresthesia of skin: Secondary | ICD-10-CM | POA: Diagnosis present

## 2014-12-04 DIAGNOSIS — I4891 Unspecified atrial fibrillation: Secondary | ICD-10-CM | POA: Diagnosis not present

## 2014-12-04 DIAGNOSIS — I63512 Cerebral infarction due to unspecified occlusion or stenosis of left middle cerebral artery: Secondary | ICD-10-CM | POA: Diagnosis not present

## 2014-12-04 DIAGNOSIS — I471 Supraventricular tachycardia: Secondary | ICD-10-CM | POA: Diagnosis not present

## 2014-12-04 DIAGNOSIS — R200 Anesthesia of skin: Secondary | ICD-10-CM | POA: Diagnosis present

## 2014-12-04 DIAGNOSIS — Z87891 Personal history of nicotine dependence: Secondary | ICD-10-CM | POA: Diagnosis not present

## 2014-12-04 DIAGNOSIS — J0100 Acute maxillary sinusitis, unspecified: Secondary | ICD-10-CM | POA: Diagnosis present

## 2014-12-04 DIAGNOSIS — I63412 Cerebral infarction due to embolism of left middle cerebral artery: Secondary | ICD-10-CM | POA: Diagnosis not present

## 2014-12-04 DIAGNOSIS — Z85820 Personal history of malignant melanoma of skin: Secondary | ICD-10-CM | POA: Diagnosis not present

## 2014-12-04 LAB — CBC AND DIFFERENTIAL
Basophils Absolute Automated: 0.06 10*3/uL (ref 0.00–0.20)
Basophils Automated: 0 %
Eosinophils Absolute Automated: 0.12 10*3/uL (ref 0.00–0.70)
Eosinophils Automated: 1 %
Hematocrit: 39.4 % (ref 37.0–47.0)
Hgb: 12.9 g/dL (ref 12.0–16.0)
Immature Granulocytes Absolute: 0.04 10*3/uL
Immature Granulocytes: 0 %
Lymphocytes Absolute Automated: 1.21 10*3/uL (ref 0.50–4.40)
Lymphocytes Automated: 10 %
MCH: 30.6 pg (ref 28.0–32.0)
MCHC: 32.7 g/dL (ref 32.0–36.0)
MCV: 93.6 fL (ref 80.0–100.0)
MPV: 9.1 fL — ABNORMAL LOW (ref 9.4–12.3)
Monocytes Absolute Automated: 1.06 10*3/uL (ref 0.00–1.20)
Monocytes: 9 %
Neutrophils Absolute: 9.44 10*3/uL — ABNORMAL HIGH (ref 1.80–8.10)
Neutrophils: 79 %
Nucleated RBC: 0 /100 WBC (ref 0–1)
Platelets: 324 10*3/uL (ref 140–400)
RBC: 4.21 10*6/uL (ref 4.20–5.40)
RDW: 13 % (ref 12–15)
WBC: 11.89 10*3/uL — ABNORMAL HIGH (ref 3.50–10.80)

## 2014-12-04 LAB — CBC
Hematocrit: 42.2 % (ref 37.0–47.0)
Hgb: 14 g/dL (ref 12.0–16.0)
MCH: 31 pg (ref 28.0–32.0)
MCHC: 33.2 g/dL (ref 32.0–36.0)
MCV: 93.4 fL (ref 80.0–100.0)
MPV: 8.8 fL — ABNORMAL LOW (ref 9.4–12.3)
Nucleated RBC: 0 /100 WBC (ref 0–1)
Platelets: 290 10*3/uL (ref 140–400)
RBC: 4.52 10*6/uL (ref 4.20–5.40)
RDW: 13 % (ref 12–15)
WBC: 16.06 10*3/uL — ABNORMAL HIGH (ref 3.50–10.80)

## 2014-12-04 LAB — LIPID PANEL
Cholesterol / HDL Ratio: 2.5
Cholesterol: 161 mg/dL (ref 0–199)
HDL: 65 mg/dL (ref >40)
LDL Calculated: 87 mg/dL (ref 0–99)
Triglycerides: 47 mg/dL (ref 34–149)
VLDL Calculated: 9 mg/dL — ABNORMAL LOW (ref 10–40)

## 2014-12-04 LAB — APTT
PTT: 32 s (ref 23–37)
PTT: 39 s — ABNORMAL HIGH (ref 23–37)

## 2014-12-04 LAB — PT/INR
PT INR: 1 (ref 0.9–1.1)
PT: 13.4 s (ref 12.6–15.0)

## 2014-12-04 LAB — HEMOLYSIS INDEX: Hemolysis Index: 25 — ABNORMAL HIGH (ref 0–18)

## 2014-12-04 MED ORDER — ATORVASTATIN CALCIUM 10 MG PO TABS
20.0000 mg | ORAL_TABLET | Freq: Every day | ORAL | Status: DC
Start: 2014-12-04 — End: 2014-12-07
  Administered 2014-12-04 – 2014-12-07 (×4): 20 mg via ORAL
  Filled 2014-12-04 (×4): qty 2

## 2014-12-04 MED ORDER — HEPARIN SODIUM (PORCINE) 5000 UNIT/ML IJ SOLN
3000.0000 [IU] | INTRAMUSCULAR | Status: DC | PRN
Start: 2014-12-04 — End: 2014-12-07
  Administered 2014-12-04 – 2014-12-06 (×3): 3000 [IU] via INTRAVENOUS
  Filled 2014-12-04 (×3): qty 1

## 2014-12-04 MED ORDER — SODIUM CHLORIDE 0.9 % IV SOLN
INTRAVENOUS | Status: DC
Start: 2014-12-04 — End: 2014-12-07

## 2014-12-04 MED ORDER — HEPARIN (PORCINE) IN D5W 50-5 UNIT/ML-% IV SOLN
12.0000 [IU]/kg/h | INTRAVENOUS | Status: DC
Start: 2014-12-04 — End: 2014-12-06
  Administered 2014-12-04: 750 [IU]/h via INTRAVENOUS
  Administered 2014-12-05: 850 [IU]/h via INTRAVENOUS
  Administered 2014-12-05: 12 [IU]/kg/h via INTRAVENOUS
  Administered 2014-12-05: 850 [IU]/h via INTRAVENOUS
  Administered 2014-12-06: 950 [IU]/h via INTRAVENOUS
  Filled 2014-12-04 (×2): qty 500

## 2014-12-04 MED ORDER — SALINE SPRAY 0.65 % NA SOLN
2.0000 | Freq: Three times a day (TID) | NASAL | Status: DC
Start: 2014-12-04 — End: 2014-12-07
  Administered 2014-12-04 – 2014-12-07 (×5): 2 via NASAL
  Filled 2014-12-04: qty 44

## 2014-12-04 MED ORDER — AZITHROMYCIN 250 MG PO TABS
500.0000 mg | ORAL_TABLET | Freq: Once | ORAL | Status: AC
Start: 2014-12-04 — End: 2014-12-04
  Administered 2014-12-04: 500 mg via ORAL
  Filled 2014-12-04: qty 2

## 2014-12-04 MED ORDER — FLUTICASONE PROPIONATE 50 MCG/ACT NA SUSP
2.0000 | Freq: Every day | NASAL | Status: DC
Start: 2014-12-04 — End: 2014-12-07
  Administered 2014-12-04 – 2014-12-07 (×4): 2 via NASAL
  Filled 2014-12-04: qty 16

## 2014-12-04 MED ORDER — AZITHROMYCIN 250 MG PO TABS
250.0000 mg | ORAL_TABLET | Freq: Every day | ORAL | Status: DC
Start: 2014-12-05 — End: 2014-12-05

## 2014-12-04 NOTE — Plan of Care (Addendum)
Problem: Day 3- Stroke  Goal: Neurological status is stable or improving  Outcome: Progressing  Pt stable during shift. VSS. SR on telemetry monitor, no events on tele during shift.  Neuro checks completed, improvement in speech and cognition noted.   Pt started on low intensity heparin gtt at 750 units/hr, next aPTT due at 1830. Bleeding precautions in place.  Echo completed today. TEE and loop monitor placement planned for tomorrow. NPO after midnight for procedure.  Per Dr. Willaim Bane (cardiology), pt will stay on heparin gtt up until TEE tomorrow.  Education/handouts on stroke, medications, and plan of care discussed in detail with pt, husband and son at beside.  Pt and family verbalize understanding of teachings.

## 2014-12-04 NOTE — Consults (Signed)
[facility] CONSULTATION REPORT    Date Time: 12/04/2014 1:12 PM  Patient Name: [name] [name]  Requesting Physician: Pearletha Forge, MD     Signed by: Governor Specking, MD  OFFICE [phone]  MD LINE [phone]    Reason for Consultation:   CVA    Assessment:   74 y.o. female with no cardiovascular history, who presents with acute CVA.    Plan:   1. Acute CVA.  Radiographic findings concerning for embolic phenomenon.  TTE is pending.  Assuming no significant findings on TTE, will plan on TEE in the am.  If TEE is normal, she will need implantable loop monitor.  Cont to monitor on tele for occult afib.  If she does have afib on tele overnight, then no need for TEE or loop monitor.  Patient consented for both.     History:   [name] [name] is a 74 y.o. female with no cardiovascular history, who presents with acute CVA.  Patient presented with expressive aphasia and found to have L MCA stroke but also multiple acute radiographic strokes.  No other focal deficits other than mild RUE weakness.  Her speech and deficits seem to be improving.  No cardiovascular history.  No history of palpitations.  No infectious issues but has been dealing with sinusitis for the last 2 weeks.      Past Medical History:     Past Medical History   Diagnosis Date   . Osteoporosis 2004   . Fatty liver    . Breast cancer 2006       Past Surgical History:     Past Surgical History   Procedure Laterality Date   . Breast implants  1990   . Tonsilectomy, adenoidectomy, bilateral myringotomy and tubes       age 16   . Skin cancer excision       basal cell   . Melanoma  2006     left arm   . Breast lumpectomy  01/15/2005     right   . Sentinel node biopsy  2006   . Removal of breast implants  2008   . Breast biopsy  2006     lumpectomy   . Breast biopsy Left 01/2014     Benign.       Family History:     Family History   Problem Relation Age of Onset   . Lung cancer Mother      smoker   . Pancreatic cancer Father    . Kidney cancer Brother     . Breast cancer Paternal Aunt      did not die from breast cancer   . Breast cancer Paternal Grandmother      did not die from breast cancer       Social History:     History     Social History   . Marital Status: Married     Spouse Name: N/A     Number of Children: N/A   . Years of Education: N/A     Social History Main Topics   . Smoking status: Former Smoker -- 0.30 packs/day for 4 years     Types: Cigarettes     Quit date: 03/31/1965   . Smokeless tobacco: Never Used   . Alcohol Use: No   . Drug Use: No   . Sexual Activity: Not on file     Other Topics Concern   . Not on file     Social  History Narrative       Allergies:   No Known Allergies    Medications:    Medications reviewed    Home Medications   Prescriptions prior to admission   Medication Sig   . aspirin 81 MG tablet Take 323 mg by mouth daily.   . calcium citrate (CALCITRATE) 950 MG tablet Take 1 tablet by mouth daily.   . calcium-vitamin D (OSCAL) 250-125 MG-UNIT per tablet Take 1 tablet by mouth daily.   . Multiple Vitamins-Minerals (VITEYES AREDS FORMULA/LUTEIN PO) Take 2 tablets by mouth 2 (two) times daily.      . raloxifene (EVISTA) 60 MG tablet Take 60 mg by mouth daily.              Current Medications   Current Facility-Administered Medications   Medication Dose Route Frequency   . aspirin  323 mg Oral Daily   . atorvastatin  20 mg Oral Daily   . [START ON 12/05/2014] azithromycin  250 mg Oral Daily   . fluticasone  2 spray Each Nare Daily   . saline  2 spray Each Nare TID                            Review of Systems:   All other systems reviewed and are negative except as stated above in the HPI.     Physical Exam:     Filed Vitals:    12/04/14 1233   BP: 108/59   Pulse: 82   Temp: 97.4 F (36.3 C)   Resp: 18   SpO2: 98%     Temp (24hrs), Avg:97.8 F (36.6 C), Min:97 F (36.1 C), Max:98.6 F (37 C)      Intake and Output Summary (Last 24 hours) at Date Time  No intake or output data in the 24 hours ending 12/04/14 1312  Wt Readings from  Last 3 Encounters:   12/02/14 61.236 kg (135 lb)   05/18/14 60.328 kg (133 lb)   05/19/13 59.33 kg (130 lb 12.8 oz)        Vital signs reviewed    GENERAL: Patient is in no acute distress   HEENT: No scleral icterus or conjunctival pallor, moist mucous membranes   NECK: No jugular venous distention or thyromegaly, normal carotid upstrokes without bruits   CARDIAC: Normal apical impulse, regular rate and rhythm, with normal S1 and S2, and no rubs, or gallops.  No murmurs  VASCULAR: 2+ carotid, radial, and distal pulses bilaterally  LUNGS: Clear to auscultation bilaterally, normal respiratory effort  ABDOMEN: No abdominal bruits, masses, or hepatosplenomegaly, nontender, non-distended, good bowel sounds   EXTREMITIES: No clubbing, cyanosis.  No LE edema  SKIN: No rash or jaundice   NEUROLOGIC: Alert and oriented to time, place and person   PSYCH:  Normal mood and affect   MUSCULOSKELETAL: Grossly normal without significant defects.      Labs Reviewed:     CBC w/Diff     Recent Labs  Lab 12/04/14  1153 12/04/14  0619 12/02/14  1941   WBC 16.06* 11.89* 15.95*   HEMOGLOBIN 14.0 12.9 13.9   HEMATOCRIT 42.2 39.4 42.3   PLATELETS 290 324 329          Basic Metabolic Profile     Recent Labs  Lab 12/02/14  1941   SODIUM 138   POTASSIUM 3.7   CHLORIDE 101   CO2 29   BUN 18   CREATININE 0.8  EGFR >60.0   GLUCOSE 121*   CALCIUM 9.6            Cardiac Enzymes     Recent Labs  Lab 12/02/14  1941   CREATINE KINASE (CK) 93   TROPONIN I <0.01          Thyroid Studies         Invalid input(s): FREET4       Cholesterol Panel     Recent Labs  Lab 12/04/14  0619   CHOLESTEROL 161   TRIGLYCERIDES 47   HDL 65   CALCULATED LDL 87          Coagulation Studies     Recent Labs  Lab 12/04/14  1153 12/02/14  1941   PT 13.4 12.9   PT INR 1.0 1.0   PTT 32 31            Imaging:     Independent review of EKG shows NSR, normal axis, intervals, ST segments    Independent review of telemetry shows short runs of irregular tachycardia

## 2014-12-04 NOTE — Plan of Care (Signed)
Problem: Day 2- Stroke  Goal: Neurological status is stable or improving  Outcome: Progressing  Neurological status is stable - pt reports no further word find difficulties or aphasic episodes today - GCS is 15 - complex neuro eval is 0- NIH is 0 -- ambulated on unit w/son - gait steady and sure -safe environment provided -   Goal: Stable vital signs and fluid balance  Outcome: Progressing  VSS - in SR on tele- pt maintains safe oxygenation - no signs of aspiration - able to move self in bed   Goal: Effective coping demonstrated  Intervention: Assess/report to LIP uncontrolled anxiety, depression, or ineffective coping.  Appears to be coping w/o difficulty

## 2014-12-04 NOTE — Plan of Care (Signed)
Problem: Safety  Goal: Patient will be free from injury during hospitalization  Outcome: Progressing  Pt alert, oriented x4 - VSS - denies pain - ambulating on unit w/son - gait stead - bed in low, locked position - call light and belongings in reach - bed rails up x2 - SCDs on -reviewed w/pt importance of safety protocols, pt verbalized understanding of protocols-  hourly rounding and safe environment provided  Intervention: Assess patient's risk for falls and implement fall prevention plan of care per policy  Denies pain - will monitor      Comments:   MRI of brain, head and neck completed - returned to floor around 2100 - tol procedure w/o difficulty - visiting w/son until around 2200 -- denies word find difficulties or slurred speech- responses to questions are somewhat delayed - stroke pathway continued

## 2014-12-04 NOTE — Progress Notes (Signed)
..  The patient and caregiver's learning abilities have been assessed. Today's individualized plan of care includes completion of MRI, labs and Echocardiogram. The plan of care was discussed with the patient and caregiver, who agree to it and demonstrate understanding of the disease process, treatment plan, medications and consequences of noncompliance. All questions and concerns were addressed.

## 2014-12-04 NOTE — Progress Notes (Signed)
PROGRESS NOTE    Date Time: 12/04/2014 11:33 AM  Patient Name: [name] [name], [name] [name]      Assessment/ Plan:   1) Embolic CVA involving the left basalganglia, posterior parietal, and frontal lobe - on aspirin. Echo pending. Neurology on board. Will probably need TEE.        Subjective:   NO changes. Tearful after I mentioned she has a CVA.     Medications:     Current Facility-Administered Medications   Medication Dose Route Frequency   . aspirin  323 mg Oral Daily   . [START ON 12/05/2014] azithromycin  250 mg Oral Daily   . fluticasone  2 spray Each Nare Daily   . saline  2 spray Each Nare TID       Review of Systems:   A comprehensive review of systems was: History obtained from the patient  General ROS: negative  Psychological ROS: negative  Respiratory ROS: no cough, shortness of breath, or wheezing  Cardiovascular ROS: no chest pain or dyspnea on exertion  Gastrointestinal ROS: negative  Genito-Urinary ROS: no dysuria, trouble voiding, or hematuria  Musculoskeletal ROS: negative  Neurological ROS: positive for - speech problems and weakness  Dermatological ROS: negative    Physical Exam:     Filed Vitals:    12/04/14 0812   BP: 120/70   Pulse: 84   Temp: 97.7 F (36.5 C)   Resp: 18   SpO2: 97%       Intake and Output Summary (Last 24 hours) at Date Time  No intake or output data in the 24 hours ending 12/04/14 1133    General appearance - alert, well appearing, and in no distress  Mental status - alert, oriented to person, place, and time  Eyes - pupils equal and reactive, extraocular eye movements intact  Mouth - mucous membranes moist, pharynx normal without lesions  Neck - supple, no significant adenopathy  Chest - clear to auscultation, no wheezes, rales or rhonchi, symmetric air entry  Heart - normal rate, regular rhythm, normal S1, S2, no murmurs, rubs, clicks or gallops  Abdomen - soft, nontender, nondistended, no masses or organomegaly  Neurological - alert, oriented, normal speech, no focal findings or movement  disorder noted  Extremities - peripheral pulses normal, no pedal edema, no clubbing or cyanosis    Labs:     Results     Procedure Component Value Units Date/Time    Lipid panel [[id number]]  (Abnormal) Collected:  12/04/14 0619    Specimen Information:  Blood Updated:  12/04/14 1023     Cholesterol 161 mg/dL      Triglycerides 47 mg/dL      HDL 65 mg/dL      LDL Calculated 87 mg/dL      VLDL Cholesterol Cal 9 (L) mg/dL      CHOL/HDL Ratio 2.5     Hemolysis index [[id number]]  (Abnormal) Collected:  12/04/14 0619     Hemolysis Index 25 (H) Updated:  12/04/14 1023    CBC and differential [[id number]]  (Abnormal) Collected:  12/04/14 0619    Specimen Information:  Blood / Blood Updated:  12/04/14 0922     WBC 11.89 (H) x10 3/uL      Hgb 12.9 g/dL      Hematocrit 96.2 %      Platelets 324 x10 3/uL      RBC 4.21 x10 6/uL      MCV 93.6 fL      MCH 30.6 pg  MCHC 32.7 g/dL      RDW 13 %      MPV 9.1 (L) fL      Neutrophils 79 %      Lymphocytes Automated 10 %      Monocytes 9 %      Eosinophils Automated 1 %      Basophils Automated 0 %      Immature Granulocyte 0 %      Nucleated RBC 0 /100 WBC      Neutrophils Absolute 9.44 (H) x10 3/uL      Abs Lymph Automated 1.21 x10 3/uL      Abs Mono Automated 1.06 x10 3/uL      Abs Eos Automated 0.12 x10 3/uL      Absolute Baso Automated 0.06 x10 3/uL      Absolute Immature Granulocyte 0.04 x10 3/uL               Rads:   Radiological Procedure reviewed  Radiology Results (24 Hour)     Procedure Component Value Units Date/Time    MRI Brain W WO Contrast [[id number]] Collected:  12/03/14 1948    Order Status:  Completed Updated:  12/03/14 2019    Narrative:      HISTORY: Difficulty with speech    EXAMINATION/TECHNIQUE:: MRI of brain performed. Precontrast images were  obtained in the sagittal and axial planes utilizing T1, proton density,  T2-weighted spin-echo pulse sequences.  Axial images were obtained with  diffusion-weighted technique gradient-echo, and FLAIR technique.    Following intravenous administration of 10 cc of gadavist, axial and  coronal T1-weighted images were obtained.    COMPARISON: Head CT 12/02/2014    FINDINGS: There are scattered multifocal areas of restricted diffusion  in the left basal ganglia, left posterior parietal, and left frontal  lobes suspicious for acute infarcts. Given the distribution, an embolic  etiology is suspected. No abnormal enhancement demonstrated on contrast  enhanced sequences. No MR evidence of acute hemorrhage at this time.  Ventricular size is within normal limits.     Visualized paranasal  sinuses exhibit mucosal thickening in both maxillary sinuses with  air-fluid levels most suggestive of acute on chronic sinusitis.      Impression:         1. Scattered left-sided focal acute infarcts involving the left basal  ganglia, posterior parietal, and frontal lobe, possibly involving an  embolic etiology.  2. Acute on chronic bilateral maxillary sinusitis.    Results discussed with patient's referring clinician Dr. Francesco Sor.    Ivin Poot, MD   12/03/2014 8:15 PM      MR Angiogram Neck W WO Contrast [[id number]] Collected:  12/03/14 1957    Order Status:  Completed Updated:  12/03/14 2018    Narrative:      HISTORY: Difficulty with speech.    EXAMINATION: MRA neck    TECHNIQUE:  In addition to noncontrast 3-D time-of-flight methodology,  coronal slab 3-D gradient-echo images were obtained following  intravenous contrast administration to assess the carotid and vertebral  arteries. Multiplanar reconstructed images were created from the source  data.     FINDINGS: Both common, internal, and external carotid arteries are  patent without focal significant narrowing.     Evaluation of the  vertebral arteries reveals of the left vertebral artery is dominant. The  right vertebral artery is diffusely attenuated, particularly at the  distal most portion. Basilar artery is patent.      Impression:  1. No MR evidence of focal significant narrowing  involving the carotid  arterial system bilaterally.  2. Patent basilar artery with dominant left vertebral artery and diffuse  attenuation of right vertebral artery, particularly at its distal most  portion.    Results discussed with patient's referring clinician Dr. Francesco Sor.    Ivin Poot, MD   12/03/2014 8:14 PM      MRA Head WO Contrast [[id number]] Collected:  12/03/14 1951    Order Status:  Completed Updated:  12/03/14 2018    Narrative:      HISTORY: Difficulties with speech    EXAMINATION: MRA head    TECHNIQUE:  3D time-of-flight methodology was utilized to assess the  vessels at the base of the brain and the major branches of the circle of  Willis. Multiplanar reconstructed images were created from the source  data.    INTERPRETATION:  The distal internal carotid arteries and the basilar  artery are normal in course and caliber. Evaluation of the circle of  Willis reveals nonvisualization of the right A1 segment of the anterior  cerebral artery. The right A2 segment appears to originate from the left  anterior cerebral arterial system, commonly a normal variant. Bilateral  fetal circulation of posterior cerebral arteries are noted. There is a  segment of suspected occlusion of the left M1 segment of the middle  cerebral artery measuring approximately 1.0 cm in length. There is also  a focal short segment of nonvisualization and suspected occlusion of the  left A1 segment of the anterior cerebral artery. No aneurysmal  dilatation noted. The basilar artery is patent. The left vertebral  artery appears dominant.      Impression:          1. Findings suspicious for 1 cm long segment occlusion of the left M1  segment of the middle cerebral artery and focal short segment occlusion  of left A1 segment of the anterior cerebral artery.  2. Remainder as above.    Results discussed with patient's referring clinician Dr. Francesco Sor.      Ivin Poot, MD   12/03/2014 8:14 PM          Signed by: Pearletha Forge, MD

## 2014-12-04 NOTE — Progress Notes (Signed)
The patient and caregiver's learning abilities have been assessed. Today's individualized plan of care includes education and handout on echo and TEE. Education and handouts on new medications azithromycin, lipitor and heparin gtt and monitoring aPTT given. The plan of care was discussed with the patient and family at the beside who agree to it and demonstrate understanding of the disease process, treatment plan, medications and consequences of noncompliance. All questions and concerns were addressed.

## 2014-12-04 NOTE — Progress Notes (Signed)
Date Time: 12/04/2014 12:51 PM  Patient Name: [name] [name]  Attending Physician: Pearletha Forge, MD  Patient Class: Inpatient  Hospital Day: 2            NEUROLOGY PROGRESS NOTE             Assessment/Plan   1. Multiple embolic-looking strokes in the left MCA distribution  2. High-grade left MCA, and ACA stenosis?  Suppressed thrombus related     Will add IV heparin   Start statin.   Wait on echo.   May need to plan a TEE to rule out embolic source.  Unless patient goes into atrial fibrillation before that    Subjective:   Patient Seen and Examined.  Doing better today.  Diagnosis discussed with the patient and the patient's husband, thought process for starting IV heparin.  Discussed, as well.  Risk-benefit ratio discussed    Review of Systems:     No headache, eye, ear nose, throat problems; no coughing or wheezing or shortness of breath, No chest pain or orthopnea, no abdominal pain, nausea or vomiting, No pain in the body or extremities, no psychiatric, neurological, endocrine, hematological or cardiac complaints except as noted above.      Physical Exam:   BP 108/59 mmHg  Pulse 82  Temp(Src) 97.4 F (36.3 C) (Oral)  Resp 18  Ht 1.676 m (5\' 6" )  Wt 61.236 kg (135 lb)  BMI 21.80 kg/m2  SpO2 98%  LMP 05/19/1992    Neuro:  Level of consciousness:  Alert and appropriate.  Speech is pretty fluent today, much better as compared to yesterday  Oriented:  X 3  Facial Movements: symmetric  pupils are equal and reactive   Strength:  No upper extremity drift----very minimal pronation noted in the right hand  Sensation to light touch: Intact bilaterally      Meds:      Scheduled Meds: PRN Meds:      aspirin 323 mg Oral Daily   atorvastatin 20 mg Oral Daily   [START ON 12/05/2014] azithromycin 250 mg Oral Daily   fluticasone 2 spray Each Nare Daily   saline 2 spray Each Nare TID       Continuous Infusions:  . sodium chloride Stopped (12/04/14 1225)   . heparin 16109 units in dextrose 5% 500 mL 750 Units/hr  (12/04/14 1222)      acetaminophen 650 mg Q4H PRN   heparin (porcine) 3,000 Units PRN   zolpidem 5 mg QHS PRN               Labs:     Recent Labs  Lab 12/02/14  1941   GLUCOSE 121*   BUN 18   CREATININE 0.8   CALCIUM 9.6   SODIUM 138   POTASSIUM 3.7   CHLORIDE 101   CO2 29   ALBUMIN 4.0   AST (SGOT) 20   ALT 21   BILIRUBIN, TOTAL 0.3   ALKALINE PHOSPHATASE 79       Recent Labs  Lab 12/04/14  1153 12/04/14  0619 12/02/14  1941   WBC 16.06* 11.89* 15.95*   HEMOGLOBIN 14.0 12.9 13.9   HEMATOCRIT 42.2 39.4 42.3   MCV 93.4 93.6 92.6   MCH, POC 31.0 30.6 30.4   MCHC 33.2 32.7 32.9   PLATELETS 290 324 329         Recent Labs      12/04/14   1153  12/02/14   1941   PTT  32  31   PT  13.4  12.9   PT INR  1.0  1.0          Radiology Results (24 Hour)     Procedure Component Value Units Date/Time    MRI Brain W WO Contrast [[id number]] Collected:  12/03/14 1948    Order Status:  Completed Updated:  12/03/14 2019    Narrative:      HISTORY: Difficulty with speech    EXAMINATION/TECHNIQUE:: MRI of brain performed. Precontrast images were  obtained in the sagittal and axial planes utilizing T1, proton density,  T2-weighted spin-echo pulse sequences.  Axial images were obtained with  diffusion-weighted technique gradient-echo, and FLAIR technique.   Following intravenous administration of 10 cc of gadavist, axial and  coronal T1-weighted images were obtained.    COMPARISON: Head CT 12/02/2014    FINDINGS: There are scattered multifocal areas of restricted diffusion  in the left basal ganglia, left posterior parietal, and left frontal  lobes suspicious for acute infarcts. Given the distribution, an embolic  etiology is suspected. No abnormal enhancement demonstrated on contrast  enhanced sequences. No MR evidence of acute hemorrhage at this time.  Ventricular size is within normal limits.     Visualized paranasal  sinuses exhibit mucosal thickening in both maxillary sinuses with  air-fluid levels most suggestive of acute on chronic  sinusitis.      Impression:         1. Scattered left-sided focal acute infarcts involving the left basal  ganglia, posterior parietal, and frontal lobe, possibly involving an  embolic etiology.  2. Acute on chronic bilateral maxillary sinusitis.    Results discussed with patient's referring clinician Dr. Francesco Sor.    Ivin Poot, MD   12/03/2014 8:15 PM      MR Angiogram Neck W WO Contrast [[id number]] Collected:  12/03/14 1957    Order Status:  Completed Updated:  12/03/14 2018    Narrative:      HISTORY: Difficulty with speech.    EXAMINATION: MRA neck    TECHNIQUE:  In addition to noncontrast 3-D time-of-flight methodology,  coronal slab 3-D gradient-echo images were obtained following  intravenous contrast administration to assess the carotid and vertebral  arteries. Multiplanar reconstructed images were created from the source  data.     FINDINGS: Both common, internal, and external carotid arteries are  patent without focal significant narrowing.     Evaluation of the  vertebral arteries reveals of the left vertebral artery is dominant. The  right vertebral artery is diffusely attenuated, particularly at the  distal most portion. Basilar artery is patent.      Impression:         1. No MR evidence of focal significant narrowing involving the carotid  arterial system bilaterally.  2. Patent basilar artery with dominant left vertebral artery and diffuse  attenuation of right vertebral artery, particularly at its distal most  portion.    Results discussed with patient's referring clinician Dr. Francesco Sor.    Ivin Poot, MD   12/03/2014 8:14 PM      MRA Head WO Contrast [[id number]] Collected:  12/03/14 1951    Order Status:  Completed Updated:  12/03/14 2018    Narrative:      HISTORY: Difficulties with speech    EXAMINATION: MRA head    TECHNIQUE:  3D time-of-flight methodology was utilized to assess the  vessels at the base of the brain and the major branches of the circle of  Willis. Multiplanar reconstructed  images were created from the source  data.    INTERPRETATION:  The distal internal carotid arteries and the basilar  artery are normal in course and caliber. Evaluation of the circle of  Willis reveals nonvisualization of the right A1 segment of the anterior  cerebral artery. The right A2 segment appears to originate from the left  anterior cerebral arterial system, commonly a normal variant. Bilateral  fetal circulation of posterior cerebral arteries are noted. There is a  segment of suspected occlusion of the left M1 segment of the middle  cerebral artery measuring approximately 1.0 cm in length. There is also  a focal short segment of nonvisualization and suspected occlusion of the  left A1 segment of the anterior cerebral artery. No aneurysmal  dilatation noted. The basilar artery is patent. The left vertebral  artery appears dominant.      Impression:          1. Findings suspicious for 1 cm long segment occlusion of the left M1  segment of the middle cerebral artery and focal short segment occlusion  of left A1 segment of the anterior cerebral artery.  2. Remainder as above.    Results discussed with patient's referring clinician Dr. Francesco Sor.      Ivin Poot, MD   12/03/2014 8:14 PM             All brain imaging (MRI, CT) personally reviewed.    Case discussed with: pt husband cardiology     Signed by: Cathe Mons, MD  Spectralink: [phone]      Answering Service: [phone]

## 2014-12-05 ENCOUNTER — Encounter
Admission: EM | Disposition: A | Discharge status: Home / Self Care | Payer: Self-pay | Source: Home / Self Care | Attending: Internal Medicine

## 2014-12-05 ENCOUNTER — Inpatient Hospital Stay: Payer: Medicare Other | Admitting: Pain Medicine

## 2014-12-05 ENCOUNTER — Encounter: Payer: Self-pay | Admitting: Pain Medicine

## 2014-12-05 ENCOUNTER — Other Ambulatory Visit: Payer: Medicare Other

## 2014-12-05 ENCOUNTER — Inpatient Hospital Stay: Payer: Medicare Other

## 2014-12-05 DIAGNOSIS — I63412 Cerebral infarction due to embolism of left middle cerebral artery: Principal | ICD-10-CM

## 2014-12-05 HISTORY — PX: ECHOCARDIOGRAM, TRANSESOPHAGEAL: SHX3783

## 2014-12-05 LAB — APTT
PTT: 96 s — ABNORMAL HIGH (ref 23–37)
PTT: 46 s — ABNORMAL HIGH (ref 23–37)
PTT: 41 s — ABNORMAL HIGH (ref 23–37)

## 2014-12-05 SURGERY — ECHOCARDIOGRAM, TRANSESOPHAGEAL
Anesthesia: Anesthesia General

## 2014-12-05 SURGERY — LOOP RECORDER  IMPLANT
Anesthesia: Local

## 2014-12-05 MED ORDER — LIDOCAINE HCL (PF) 2 % IJ SOLN
INTRAMUSCULAR | Status: AC
Start: 2014-12-05 — End: ?
  Filled 2014-12-05: qty 5

## 2014-12-05 MED ORDER — LACTATED RINGERS IV SOLN
INTRAVENOUS | Status: DC
Start: 2014-12-05 — End: 2014-12-07

## 2014-12-05 MED ORDER — FENTANYL CITRATE 0.05 MG/ML IJ SOLN
25.0000 ug | INTRAMUSCULAR | Status: DC | PRN
Start: 2014-12-05 — End: 2014-12-05

## 2014-12-05 MED ORDER — PHENYLEPHRINE HCL 10 MG/ML IJ SOLN
INTRAMUSCULAR | Status: DC | PRN
Start: 2014-12-05 — End: 2014-12-05
  Administered 2014-12-05: 40 ug via INTRAVENOUS

## 2014-12-05 MED ORDER — LIDOCAINE HCL 2 % IJ SOLN
INTRAMUSCULAR | Status: DC | PRN
Start: 2014-12-05 — End: 2014-12-05
  Administered 2014-12-05: 60 mg

## 2014-12-05 MED ORDER — PROPOFOL INFUSION 10 MG/ML
INTRAVENOUS | Status: DC | PRN
Start: 2014-12-05 — End: 2014-12-05
  Administered 2014-12-05: 20 mg via INTRAVENOUS
  Administered 2014-12-05: 10 mg via INTRAVENOUS
  Administered 2014-12-05 (×3): 20 mg via INTRAVENOUS
  Administered 2014-12-05: 50 mg via INTRAVENOUS
  Administered 2014-12-05 (×3): 20 mg via INTRAVENOUS

## 2014-12-05 MED ORDER — LACTATED RINGERS IV SOLN
INTRAVENOUS | Status: DC | PRN
Start: 2014-12-05 — End: 2014-12-05

## 2014-12-05 MED ORDER — PROPOFOL 10 MG/ML IV EMUL
INTRAVENOUS | Status: AC
Start: 2014-12-05 — End: ?
  Filled 2014-12-05: qty 40

## 2014-12-05 MED ORDER — LIDOCAINE-EPINEPHRINE 2 %-1:200000 IJ SOLN
INTRAMUSCULAR | Status: AC
Start: 2014-12-05 — End: 2014-12-06
  Filled 2014-12-05: qty 20

## 2014-12-05 MED ORDER — FENTANYL CITRATE 0.05 MG/ML IJ SOLN
INTRAMUSCULAR | Status: AC
Start: 2014-12-05 — End: ?
  Filled 2014-12-05: qty 2

## 2014-12-05 MED ORDER — ONDANSETRON HCL 4 MG/2ML IJ SOLN
4.0000 mg | Freq: Once | INTRAMUSCULAR | Status: DC | PRN
Start: 2014-12-05 — End: 2014-12-05

## 2014-12-05 MED ORDER — AMOXICILLIN-POT CLAVULANATE 875-125 MG PO TABS
1.0000 | ORAL_TABLET | Freq: Two times a day (BID) | ORAL | Status: DC
Start: 2014-12-05 — End: 2014-12-07
  Administered 2014-12-05 – 2014-12-07 (×5): 1 via ORAL
  Filled 2014-12-05 (×5): qty 1

## 2014-12-05 MED ORDER — FENTANYL CITRATE 0.05 MG/ML IJ SOLN
INTRAMUSCULAR | Status: DC | PRN
Start: 2014-12-05 — End: 2014-12-05
  Administered 2014-12-05 (×3): 25 ug via INTRAVENOUS

## 2014-12-05 NOTE — OR Nursing (Signed)
Loop recorder implant. Patient tolerated procedure well with no complications.    [employer] REVEAL  [id number]  SN [id number]  USE BY 2015-08-21

## 2014-12-05 NOTE — Progress Notes (Signed)
Bari Edward, MD  [facility]  Tel:  [phone]            Assessment :     1. Acute CVA. Possibly embolic as per neuro. TTE unremarkable. TEE today . If unremarkable, pt will get a Linq  2. PSVT: Likely Atrial tach. Monitor  3. HPL; on Statins      D/w risks and benefits of TEE and loop recorder and pt agreeable . Scheduled for today    Cardiology Diagnostics     Telemetry:  (I have personally reviewed the telemetry strips)   NSR, PSVT ( Atrial tach)    Echocardiogram: ( personally reviewed the report)  1. Normal biventricular size and systolic function with an estimated EF of 60-65%.  2. Grade 1 diastolic dysfunction  3. No significant valvular disease  4. No obvious intracardiac masses, thrombi or vegetations were appreciated but cannot be completely excluded.    Problem list     Patient Active Problem List   Diagnosis   . Aphasia            Events/ROS/ Subjective since last 24 hrs:         Some right sided residual weakness and slurred spech      Objective:   Vitals reviewed     VS: BP 128/75 mmHg  Pulse 85  Temp(Src) 97.5 F (36.4 C) (Oral)  Resp 18  Ht 1.676 m (5\' 6" )  Wt 61.236 kg (135 lb)  BMI 21.80 kg/m2  SpO2 99%  LMP 05/19/1992  No intake or output data in the 24 hours ending 12/05/14 1109      Constitutional :  no acute distress,  Respiratory:  Good air movement and respiratory effort  bilaterally. No use of accessory muscles. Clear bilaterally. No rales orwheezes.  Cardiovascular: Regular . Nl S1 and S2.Marland Kitchen  NO MRG. No carotid bruits. No  JVD  Dorsal pedis 2+b/l.   No  edema.    Gastrointestinal Soft. Non-tender. Normoactive BS. No abdominal bruits  Psychiatric: AAO X3.  Normal mood and effect.    Laboratory Studies:   (I have personally reviewed the laboratory values below)      CBC w/Diff     Recent Labs  Lab 12/04/14  1153 12/04/14  0619 12/02/14  1941   WBC 16.06* 11.89* 15.95*   HEMOGLOBIN 14.0 12.9 13.9   HEMATOCRIT 42.2 39.4 42.3   PLATELETS 290 324 329          Basic  Metabolic Profile     Recent Labs  Lab 12/02/14  1941   SODIUM 138   POTASSIUM 3.7   CHLORIDE 101   CO2 29   BUN 18   CREATININE 0.8   EGFR >60.0   GLUCOSE 121*   CALCIUM 9.6            Cardiac Enzymes     Recent Labs  Lab 12/02/14  1941   CREATINE KINASE (CK) 93   TROPONIN I <0.01          Thyroid Studies         Invalid input(s): FREET4       Cholesterol Panel     Recent Labs  Lab 12/04/14  0619   CHOLESTEROL 161   TRIGLYCERIDES 47   HDL 65   CALCULATED LDL 87          Coagulation Studies     Recent Labs  Lab 12/05/14  1022 12/05/14  0153 12/04/14  1823 12/04/14  1153 12/02/14  1941   PT  --   --   --  13.4 12.9   PT INR  --   --   --  1.0 1.0   PTT 46* 96* 39* 32 31              Current Medications   Meds reviewed:    Current Facility-Administered Medications   Medication Dose Route Frequency   . amoxicillin-clavulanate  1 tablet Oral Q12H SCH   . aspirin  323 mg Oral Daily   . atorvastatin  20 mg Oral Daily   . fluticasone  2 spray Each Nare Daily   . saline  2 spray Each Nare TID                  Bari Edward, MD  12/05/2014 11:09 AM

## 2014-12-05 NOTE — Progress Notes (Signed)
PROGRESS NOTE    Date Time: 12/05/2014 12:44 PM  Patient Name: [name] [name]  Length of Stay: 3    Subjective:   Pt; I am ok. A little some nose congestion present.    Medications:     Current Facility-Administered Medications   Medication Dose Route Frequency   . amoxicillin-clavulanate  1 tablet Oral Q12H SCH   . aspirin  323 mg Oral Daily   . atorvastatin  20 mg Oral Daily   . fluticasone  2 spray Each Nare Daily   . saline  2 spray Each Nare TID         Review of Systems:   A comprehensive review of systems was: General ROS: negative for - chills  ENT ROS: negative for - epistaxis  Respiratory ROS: negative for - hemoptysis  Cardiovascular ROS: negative for - loss of consciousness  Gastrointestinal ROS: negative for - diarrhea  Musculoskeletal ROS: negative for - joint swelling  Neurological ROS: negative for - confusion    Physical Exam:     Filed Vitals:    12/05/14 1200   BP: 140/66   Pulse: 99   Temp: 98 F (36.7 C)   Resp: 18   SpO2: 100%       Intake and Output Summary (Last 24 hours) at Date Time  No intake or output data in the 24 hours ending 12/05/14 1244    Mental status - alert, oriented to person, place, and time  Eyes - pupils equal and reactive, extraocular eye movements intact  Ears - bilateral TM's and external ear canals normal  Nose - normal and patent, no erythema, discharge or polyps  Mouth - mucous membranes moist, pharynx normal without lesions  Neck - supple, no significant adenopathy  Lymphatics - no palpable lymphadenopathy  Chest - no tachypnea, retractions or cyanosis  Heart - S1 and S2 normal  Abdomen - no rebound tenderness noted  Extremities - no pedal edema noted    Labs:     Results     Procedure Component Value Units Date/Time    APTT [[id number]]  (Abnormal) Collected:  12/05/14 1022     PTT 46 (H) sec Updated:  12/05/14 1047     APTT Anticoag. Given w/i 48 hrs. heparin     APTT [[id number]]  (Abnormal) Collected:  12/05/14 0153     PTT 96 (H) sec Updated:  12/05/14 0224     APTT  Anticoag. Given w/i 48 hrs. heparin     APTT [[id number]]  (Abnormal) Collected:  12/04/14 1823     PTT 39 (H) sec Updated:  12/04/14 1848     APTT Anticoag. Given w/i 48 hrs. heparin           Recent CBC No results for input(s): RBC, HGB, HCT, MCV, MCH, MCHC, RDW, MPV, LABPLAT in the last 24 hours.    Invalid input(s): WHITEBLOODCE,  NRBCA,  REFLX,  ANRBA  Recent BMP No results for input(s): GLU, BUN, CREAT, CA, NA, K, CL, CO2 in the last 24 hours.    Invalid input(s): AGAP    Rads:   Radiological Procedure reviewed.    Assessment:   Acute CVA with multiple embolic at left MCA  Acute on chronic sinusitis     Plan:   Echo not significant and EF 60-65%  Continue iv heparin  Continue asa and statin  Speech therapy follow up  Per speech ok with diet  Cardiology follow up  Plan to TEE today  Neurology follow up  Pt/ot follow up  Pt c/o some pain and congestion that was not better  Continue Flonase  May change abx to Augmentin  Continue current management      Signed by: Ophelia Charter Maliyah Willets,MD

## 2014-12-05 NOTE — Progress Notes (Signed)
Severe Sepsis Screen    Date: 12/05/2014 Time: 10:41 PM  Nurse Signature: Monesha Monreal A Jaryn Hocutt    Exclusions:      Patients meeting the following criteria are excluded from screening:     []  Suspicion or diagnosis of sepsis is documented and until 72 hours after antibiotics started or last regimen change:   - If Yes, Date of Documented Sepsis:                                          - If Yes, Date of last change in antibiotics:                                           []  Surgery - No screening for 24 hours after surgery   - If Yes, Date of Surgery:                                           []  Arctic Sun hypothermia protocol- Resume screening when arctic sun complete   []  Nancey Kreitz Care Orders- Do not resume screening    Did you check any of the boxes above?     [x]  No, Continue to section A   []  Yes, Stop Here, Patient Excluded from Sepsis Screening. If screening should resume in the future, place "sticky note to treatment team" with date/time of when screening should resume. Communicate patient excluded from screening due to Levar Fayson Care Orders using "sticky note to treatment team."    A. Infection:      Does your patient have ONE or more of the following infection criteria?     [x]  Documented Infection - Does the patient have positive culture results (from blood, sputum, urine, etc)?   [x]  Anti-Infective Therapy - Is the patient receiving antibiotic, antifungal, or other anti-infective therapy?   []  Pneumonia - Is there documentation of pneumonia (X Ray, etc)?   []  WBC's - Have WBC's been found in normally sterile fluid (urine, CSF, etc.)?   []  Perforated Viscus - Does the patient have a perforated hollow organ (bowel)?    A.  Did you check any of the boxes above?     []  No, Stop Here and Sepsis Screen Negative   [x]  Yes, continue to section B    B. SIRS:      Does your patient have TWO or more of the following SIRS criteria?    [x]  Temperature - Is the patient's temperature: Temp: (!) 96.3 F (35.7 C)  (12/05/14 2008)   - Greater than or equal to 38.3 degrees C (greater than 100.9 degrees F)?   - Less than or equal to 36 degrees C (less than or equal to 96.8 degrees F)?     []  Heart Rate: Heart Rate: 89 (12/05/14 2008)   - Is the patient's heart rate greater than or equal to 90 bpm?     []  Respiratory: Resp Rate: 18 (12/05/14 2008)   - Is the patient's respiratory rate greater than 20?     [x]  WBC Count - Is the patient's WBC count:   Recent Labs  Lab 12/04/14  1153   WBC 16.06*       -  Greater than or equal to 12,000/mm3 OR   - Less than or equal to 4,000/mm3 OR    - Are there greater than 10% immature neutrophils (bands)?     []  Glucose >140 without diabetes or on steroids?   Recent Labs  Lab 12/02/14  1941   GLUCOSE 121*         []  Significant edema is present?    B.  Did you check two or more of the boxes above?     []  No, Stop Here and Sepsis Screen Negative   [x]  Yes, contact Charge Nurse, continue to section C    C. ACUTE Organ Dysfunction:      Does your patient have ONE or more of the following organ dysfunction? (May need to wait for lab results for assessment - see below) Organ dysfunction must be a result of the sepsis NOT CHRONIC conditions.     []  Cardiovascular - Does the patient have a: BP: 113/55 mmHg (12/05/14 2008)   - Systolic Blood Pressure less than or equal to 90 mmHg OR   - Systolic Blood Pressure has dropped 40 mmHg or more from baseline OR   - Mean Arterial Pressure less than or equal to 70 mmHg (for at least one hour despite fluid resuscitation OR   - require vasopressor support?     []  Respiratory - Does the patient have new hypoxia defined by any of the following?   - A sustained increase in oxygen requirements by at least 2L/min on NC or 28% FiO2 within the last 24 hrs OR   - A persistent decrease in oxygen saturation of greater than or equal to 5% lasting at least four or more hours and occurring within the last 24 hours     []  Renal - Does the patient have:   - low urine  output (e.g. Less than 0.5 mL/kg/HR for one hour despite adequate fluid resuscitation OR   - Increased creatinine (greater than 50% increase from baseline) OR   - require acute dialysis?     []  Hematologic - Does the patient have:   - Low platelet count (less than 100,000 mm3)   Recent Labs  Lab 12/04/14  1153   PLATELETS 290    OR   - INR/aPTT greater than upper limit of normal?   Recent Labs  Lab 12/04/14  1153   PT INR 1.0    OR        []  Metabolic - Does the patient have a high lactate (plasma lactate greater than or equal to 2.4 mMol/L?        []  Hepatic - Are the patient's liver enzymes elevated (ALT greater than 72 IU/L or Total Bilirubin greater than 2 MG/dL)?   Recent Labs  Lab 12/02/14  1941   BILIRUBIN, TOTAL 0.3   ALT 21         []  CNS - Does the patient have altered consciousness or reduced Glasgow Coma Scale?     Other Active Diagnoses that may be contributing to signs of end organ dysfunction (Ex. Chronic kidney disease, cirrhosis):   - ________________________________________________________________      C.  Did you check any of the boxes above?     [x]  No, Sepsis Screen Negative   []  YES:  A) Infection + B) SIRS + C) Acute Organ Dysfunction = Positive Screen for Severe Sepsis. Notify attending (house officer during off-hours).     Notify Attending/House Technical sales engineer and document in Complex Assessment under provider  notification   - Name of physician notified:                                           - Date/Time Notifiied:                                             Document actions:   _0  Lactate drawn   _1  Blood Cultures obtained   _2  Antibiotics initiated or modified   _3   IV Fluid administered 0.9% NS __________ mLs given   Nursing Comments/Narrative:    - ______________________________________________________________     The Surviving Sepsis Guidelines recommend the following interventions to be completed within one hour of a positive sepsis screen.   Obtain new blood cultures prior to  antibiotic administration if not done within the last 24 hours.   Obtain lactate level, if initial lactate > 62mol, repeat lactate in 2 hours for goal decrease 10-20%; If there is not a decrease call [facility], hospital  If SBP < 90 or MAP < 65 or lactate greater than 4 mmol/dl; Start 0.9% NS IV Fluid Bolus of 30 mL/Kg (minimum) to maintain MAP > 65    Initiate vasopressors for hypotension not responding to fluid resuscitation (1st line Norepinephrine 1 - 300 mcg/min IV) - Patient must be in CCU if requires vasopressor   Initiate or escalate antibiotic therapy   Goal urine output greater than/equal to 0.5 mL/kg/hr

## 2014-12-05 NOTE — Progress Notes (Addendum)
Date Time: 12/05/2014 12:53 PM  Patient Name: [name] [name]  Attending Physician: Marcelino Freestone, MD  Patient Class: Inpatient  Hospital Day: 3            NEUROLOGY PROGRESS NOTE             Assessment/Plan   1. Multiple embolic-looking strokes in the left MCA distribution  2. High-grade left MCA, and ACA stenosis   ? Fresh  thrombus related  3. Worsening leukocytosis of unclear etiology, but she has remained afebrile  ?Sinusitis related already on antibiotic     Continue IV heparin   Send out urinalysis and culture   Continue statin.   Wait on TEE, which is planned for today.   Would continue anticoagulation at least for 3-6 months on an empiric basis for now.   I think patient will need a loop monitor as well unless she converts to atrial fibrillation    Subjective:   Patient Seen and Examined.  Doing better today.  Spoke to speech therapy, who reports that her deficits are significantly improved and me.  She complains of being hungry as she is nothing by mouth for procedure    Review of Systems:     No headache, eye, ear nose, throat problems; no coughing or wheezing or shortness of breath, No chest pain or orthopnea, no abdominal pain, nausea or vomiting, No pain in the body or extremities, no psychiatric, neurological, endocrine, hematological or cardiac complaints except as noted above.      Physical Exam:   BP 140/66 mmHg  Pulse 99  Temp(Src) 98 F (36.7 C) (Oral)  Resp 18  Ht 1.676 m (5\' 6" )  Wt 61.236 kg (135 lb)  BMI 21.80 kg/m2  SpO2 100%  LMP 05/19/1992    Neuro:  Level of consciousness:  Alert and appropriate.  Speech is  fluent today, much better as compared to initial exam  Oriented:  X 3  Facial Movements: symmetric  pupils are equal and reactive   Strength:  No upper extremity drift----very minimal pronation noted in the right hand  Sensation to light touch: Intact bilaterally  Lungs clear except for a few scattered wheezes.  Cardiac heart sounds normal.  Abdomen soft.  Extremities no  edema, few areas of skin rash noted in the hands   On the dorsal surface      Meds:      Scheduled Meds: PRN Meds:        amoxicillin-clavulanate 1 tablet Oral Q12H [facility]   aspirin 323 mg Oral Daily   atorvastatin 20 mg Oral Daily   fluticasone 2 spray Each Nare Daily   saline 2 spray Each Nare TID       Continuous Infusions:  . sodium chloride 100 mL/hr at 12/04/14 1533   . heparin 16109 units in dextrose 5% 500 mL 750 Units/kg/hr (12/05/14 1051)      acetaminophen 650 mg Q4H PRN   heparin (porcine) 3,000 Units PRN   zolpidem 5 mg QHS PRN               Labs:       Recent Labs  Lab 12/02/14  1941   GLUCOSE 121*   BUN 18   CREATININE 0.8   CALCIUM 9.6   SODIUM 138   POTASSIUM 3.7   CHLORIDE 101   CO2 29   ALBUMIN 4.0   AST (SGOT) 20   ALT 21   BILIRUBIN, TOTAL 0.3   ALKALINE PHOSPHATASE 79  Recent Labs  Lab 12/04/14  1153 12/04/14  0619 12/02/14  1941   WBC 16.06* 11.89* 15.95*   HEMOGLOBIN 14.0 12.9 13.9   HEMATOCRIT 42.2 39.4 42.3   MCV 93.4 93.6 92.6   MCH 31.0 30.6 30.4   MCHC 33.2 32.7 32.9   PLATELETS 290 324 329         Recent Labs      12/05/14   1022  12/05/14   0153   12/04/14   1153  12/02/14   1941   PTT  46*  96*   < >  32  31   PT   --    --    --   13.4  12.9   PT INR   --    --    --   1.0  1.0    < > = values in this interval not displayed.          Radiology Results (24 Hour)     ** No results found for the last 24 hours. **           All brain imaging (MRI, CT) personally reviewed.    Case discussed with: pt to pathology, cardiology , Dr. reddy   Signed by: Cathe Mons, MD  Spectralink: [phone]      Answering Service: [phone]

## 2014-12-05 NOTE — PT Progress Note (Signed)
[facility]  [address]  [place] [address]  [phone]    Physical Therapy Treatment    Patient:  [name] [name]        MRN#:  [id number]  Unit:  [facility] INTERMEDIATE CARE        Room/Bed:  MI627/MI627-01    Medical Diagnosis: Aphasia [R47.01]    Time of treatment:  PT Received On: 12/05/14  Start Time: 1035 Stop Time: 1115  Time Calculation (min): 40 min    Treatment #: PT Visit Number: 1/2    Patient's medical condition is appropriate for Physical Therapy intervention at this time.    Assessment   Pt tolerated session well.  Demo high level balance deficits in RLE, esp w/ eyes closed.  Discussed possibility of OPPT w/ pt.  Stairs deferred today 2* IV.  Will continue to follow.      Plan   Recommendation  Discharge Recommendation: Home with outpatient PT  PT Frequency: 2-3x/wk    Continue plan of care.    Interdisciplinary Communication: discussed w/ RN    Subjective   "I'd like to get up and move around."  Patient is agreeable to participation in the therapy session. Nursing clears patient for therapy.  Pain: 0/10    Vitals:   Filed Vitals:    12/04/14 2007 12/04/14 2314 12/05/14 0400 12/05/14 0840   BP: 105/77 147/74 121/58 128/75   Pulse: 85 102 76 85   Temp: 97.7 F (36.5 C) 97.3 F (36.3 C) 97 F (36.1 C) 97.5 F (36.4 C)   TempSrc: Oral Oral Oral Oral   Resp: 18 18 17 18    Height:       Weight:       SpO2: 96% 99% 97% 99%     Objective     Precautions/ Contraindications:  falls    Patient is in bed with  Telemetry and Intravenous (IV) in place.    Functional Mobility:  Rolling: independent  Supine to Sit: independent  Scooting: independent  Sit to Supine: independent  Sit to Stand: independent  Stand to Sit: independent  Transfer Training: independent    Gait:   WB status: FWB  Assistive Device: none  Assist Level: supervision  Distance: 300' x 2  Pattern: decreased knee flexion R during swing, able to correct w/ VCs   Stairs/Curbs: NT 2* IV    Balance Training:  Pt practiced Rhomberg  and Sharpened Rhomberg EO/EC as well as SLS EO/EC.  Tendency to list toward R, able to identify.  Overcompensation to L w/ attempts to correct.      Educated the patient to role of physical therapy, plan of care, goals  of therapy and safety with mobility and ADLs.    Patient left in bedside chair w/ family present with all needs met, equipment intact and call bell within reach. RN notified of session outcome.   Mobility status posted at bedside and within E.M.R.    Goals per Eval/ Re-eval:   Goals  Goal Formulation: With patient  Time for Goal Acheivement: 2 visits  Goals: Select goal  Pt Will Ambulate: > 200 feet, independent, to maximize functional mobility and independence  Pt Will Go Up / Down Stairs: 1 flight, independent, to maximize functional mobility and independence  Other Goal: RLE strength 5/5    Signature:  Prince Solian, PT  12/05/2014 11:13 AM   Phone: [phone]

## 2014-12-05 NOTE — Plan of Care (Addendum)
Problem: Day 4- Stroke  Goal: Neurological status is stable or improving  Outcome: Progressing  Pt stable during shift. VSS. SR on telemetry monitor, no events on tele during shift.  Neuro checks completed, no change in speech and cognition noted.   Pt on low intensity heparin gtt at 700 units/hr, next aPTT due at 1000. Bleeding precautions in place.  Echo completed today. TEE and loop monitor placement planned for today. NPO currently. Education/handouts on stroke, medications, and plan of care discussed in detail with pt and she verbalize understanding of teachings.

## 2014-12-05 NOTE — Anesthesia Postprocedure Evaluation (Signed)
Anesthesia Post Evaluation    Patient: [name] [name]    Procedures performed: Procedure(s):  ECHOCARDIOGRAM, TRANSESOPHAGEAL    Anesthesia type: General TIVA    Patient location:Phase I PACU    Last vitals:   Filed Vitals:    12/05/14 1455   BP: 92/53   Pulse: 73   Temp:    Resp: 18   SpO2: 98%       Post pain: Patient not complaining of pain, continue current therapy      Mental Status:awake and alert     Respiratory Function: tolerating room air    Cardiovascular: stable    Nausea/Vomiting: patient not complaining of nausea or vomiting    Hydration Status: adequate    Post assessment: no apparent anesthetic complications, no reportable events and no evidence of recall

## 2014-12-05 NOTE — Anesthesia Preprocedure Evaluation (Addendum)
Anesthesia Evaluation    AIRWAY    Mallampati: II    TM distance: >3 FB  Neck ROM: full  Mouth Opening:full   CARDIOVASCULAR    cardiovascular exam normal, regular and normal       DENTAL    no notable dental hx     PULMONARY    pulmonary exam normal and clear to auscultation     OTHER FINDINGS    Acute CVA, aphasia,EF 60%, PSVT              Anesthesia Plan    ASA 3     general                     intravenous induction   Detailed anesthesia plan: general IV        Post op pain management: per surgeon    informed consent obtained    ECG reviewed  pertinent labs reviewed  imaging results reviewed

## 2014-12-05 NOTE — Plan of Care (Signed)
Problem: Day 2- Stroke  Goal: Neurovascular Status is Stable or Improving  Outcome: Progressing  The patient and care giver's learning abilities have been assessed. Today's individualized plan of care is continue to monitor neuro status q4, Heparin gtt LI, TEE today and NPO, loop monitor placement, and falls precautions with hourly rounding.  This was discussed with the patient and care giver and agree to it. Patient and care giver demonstrates understanding of disease process, treatment plan, medications and consequences of noncompliance. All questions and concerns were addressed.           Problem: Day 4- Stroke  Goal: Neurovascular Status is Stable or Improving  Outcome: Progressing  The patient and care giver's learning abilities have been assessed. Today's individualized plan of care is continue to monitor neuro status q4, Heparin gtt LI, TEE today and NPO, loop monitor placement, and falls precautions with hourly rounding.  This was discussed with the patient and care giver and agree to it. Patient and care giver demonstrates understanding of disease process, treatment plan, medications and consequences of noncompliance. All questions and concerns were addressed.           Comments:   Pt. AAOX4, follows commands.  No deficits noted, only mild weakness on right side and numbness in right hand.  Pupils PERRL.  No facial droop.  Pt ambulator with steady gait.  Pt went for TEE and returned with no complications. Heparin restarted.  Pt had loop monitor placed while in CVIR also.  Pt denies any distress, no pain or SOB.  Speech clear.  Report given off to night shift.

## 2014-12-05 NOTE — Transfer of Care (Signed)
Anesthesia Transfer of Care Note    Patient: [name] [name]    Procedures performed: Procedure(s):  ECHOCARDIOGRAM, TRANSESOPHAGEAL    Anesthesia type: MAC    Patient location:Phase I PACU    Last vitals:   Filed Vitals:    12/05/14 1443   BP: 94/73   Pulse: 78   Temp: 37 C (98.6 F)   Resp: 18   SpO2: 96%       Post pain: Patient not complaining of pain, continue current therapy      Mental Status:sedated    Respiratory Function: tolerating nasal cannula    Cardiovascular: stable    Nausea/Vomiting: patient not complaining of nausea or vomiting    Hydration Status: adequate    Post assessment: no apparent anesthetic complications, no reportable events and no evidence of recall

## 2014-12-05 NOTE — SLP Progress Note (Signed)
Speech Language Pathology    Speech and Language Treatment    Patient: [name] [name]    MRN#: [id number]    Date/Time of Treatment:   Time Calculation  SLP Received On: 12/05/14  Start Time: 0845  Stop Time: 0945  Time Calculation (min): 60 min  Total Treatment Time (min): 60    Assessment   Pt is awake and alert. Significant improvements noted today compared to Saturday with language and cognition. She shows significantly improved word retrieval for expressing ideas in conversation. Only min word retrieval difficulties noted for providing descriptions and in conversation. Improvements also noted for processing of written information, attention to detail, self-monitoring and working memory for simple reading, organization, and problem solving activities. Improved accuracy for more complex cognitive tasks, yet very slow processing with pt requiring min cues to increase processing, attention to detail and self-monitoring and working memory for [employer] complex reading, organization, problem solving tasks.    Plan   Continue slp tx toward POC in initial eval. Recommend out pt slp tx upon d/c.    Interdisciplinary Communication: Discussed with RN    Visit Number: 1/5    Subjective   Patient is agreeable to participation in the therapy session. Patient's medical condition is  appropriate for Speech therapy intervention at this time.    Precautions: Safety   Objective   Tx focused on use of strategies to increase word retrieval, processing, attention to detail, self-monitoring, working memory, organization and problem solving.    Signature: Daron Offer, M.S. CCC-SLP

## 2014-12-06 ENCOUNTER — Encounter: Payer: Self-pay | Admitting: Cardiovascular Disease

## 2014-12-06 LAB — CBC
Hematocrit: 37 % (ref 37.0–47.0)
Hgb: 12.1 g/dL (ref 12.0–16.0)
MCH: 30.2 pg (ref 28.0–32.0)
MCHC: 32.7 g/dL (ref 32.0–36.0)
MCV: 92.3 fL (ref 80.0–100.0)
MPV: 9.2 fL — ABNORMAL LOW (ref 9.4–12.3)
Nucleated RBC: 0 /100 WBC (ref 0–1)
Platelets: 303 10*3/uL (ref 140–400)
RBC: 4.01 10*6/uL — ABNORMAL LOW (ref 4.20–5.40)
RDW: 13 % (ref 12–15)
WBC: 11.26 10*3/uL — ABNORMAL HIGH (ref 3.50–10.80)

## 2014-12-06 LAB — URINALYSIS WITH MICROSCOPIC
Bilirubin, UA: NEGATIVE
Glucose, UA: NEGATIVE
Ketones UA: NEGATIVE
Leukocyte Esterase, UA: NEGATIVE
Nitrite, UA: NEGATIVE
Protein, UR: NEGATIVE
Specific Gravity UA: 1.005 (ref 1.001–1.035)
Urine pH: 6 (ref 5.0–8.0)
Urobilinogen, UA: NEGATIVE mg/dL

## 2014-12-06 LAB — PT/INR
PT INR: 1 (ref 0.9–1.1)
PT: 12.8 s (ref 12.6–15.0)

## 2014-12-06 LAB — APTT
PTT: 43 s — ABNORMAL HIGH (ref 23–37)
PTT: 123 s — ABNORMAL HIGH (ref 23–37)
PTT: 32 s (ref 23–37)
PTT: 65 s — ABNORMAL HIGH (ref 23–37)

## 2014-12-06 LAB — ECG 12-LEAD
Atrial Rate: 105 {beats}/min
P Axis: 68 degrees
P-R Interval: 176 ms
Q-T Interval: 356 ms
QRS Duration: 78 ms
QTC Calculation (Bezet): 470 ms
R Axis: 70 degrees
T Axis: 60 degrees
Ventricular Rate: 105 {beats}/min

## 2014-12-06 MED ORDER — WARFARIN SODIUM 5 MG PO TABS
5.0000 mg | ORAL_TABLET | Freq: Once | ORAL | Status: AC
Start: 2014-12-06 — End: 2014-12-06
  Administered 2014-12-06: 5 mg via ORAL
  Filled 2014-12-06: qty 1

## 2014-12-06 MED ORDER — HEPARIN (PORCINE) IN D5W 50-5 UNIT/ML-% IV SOLN
12.0000 [IU]/kg/h | INTRAVENOUS | Status: DC
Start: 2014-12-06 — End: 2014-12-07
  Administered 2014-12-06 (×2): 950 [IU]/h via INTRAVENOUS
  Administered 2014-12-07: 12 [IU]/kg/h via INTRAVENOUS
  Filled 2014-12-06: qty 500

## 2014-12-06 MED ORDER — ASPIRIN 325 MG PO TABS
325.0000 mg | ORAL_TABLET | Freq: Every day | ORAL | Status: DC
Start: 2014-12-07 — End: 2014-12-07
  Administered 2014-12-07: 325 mg via ORAL
  Filled 2014-12-06: qty 1

## 2014-12-06 NOTE — Plan of Care (Signed)
The patient and caregiver's learning abilities have been assessed. Today's individualized plan of care includes continue with heparin drip and monitor labs values . The plan of care was discussed with the patient and caregiver, who agree to it and demonstrate understanding of the disease process, treatment plan, medications and consequences of noncompliance. All questions and concerns were addressed.

## 2014-12-06 NOTE — Plan of Care (Signed)
Problem: Moderate/High Fall Risk Score >/=15  Goal: Patient will remain free of falls  Outcome: Progressing  Reviewed fall prevention strategies with pt.  Non-skid slippers on.  Call bell and belongings within reach.  Hourly rounding done.    Problem: Health Promotion  Goal: Vaccination Screening  All patients will be screened for current vaccination status on each admission.   Outcome: Completed Date Met:  12/06/14  Goal: Knowledge - disease process  Extent of understanding conveyed about a specific disease process.   Outcome: Progressing  Goal: Risk control - tobacco abuse  Actions to eliminate or reduce tobacco use.   Outcome: Completed Date Met:  12/06/14  Goal: Knowledge - health resources  Extent of understanding and conveyed about healthcare resources.   Outcome: Progressing    Problem: Safety  Goal: Patient will be free from injury during hospitalization  Outcome: Progressing  Intervention: Assess patient's risk for falls and implement fall prevention plan of care per policy  .  Intervention: Ensure appropriate safety devices are available at the bedside  .  Intervention: Hourly rounding.  .      Problem: Pain  Goal: Patient's pain/discomfort is manageable  Outcome: Completed Date Met:  12/06/14  Pt denies pain.    Problem: Psychosocial and Spiritual Needs  Goal: Demonstrates ability to cope with hospitalization/illness  Outcome: Progressing    Problem: Potential for Aspiration  Goal: Risk of aspiration will be minimized  Outcome: Progressing  Pt tolerating regular consistency diet.  No sign/sx of aspiration noted.    Problem: Neurological Deficit  Goal: Neurological status is stable or improving  Outcome: Progressing  No change in neurologic assessment today.  GCS 15.    Problem: Potential for Peripheral Neurovascular Impairment  Goal: Extremity color, movement, sensation are maintained or improved  Outcome: Progressing    Problem: Impaired Mobility  Goal: Mobility/activity is maintained at optimum level  for patient  Outcome: Progressing  Pt ambulated frequently around unit this evening.    Comments:   Per Dr. Francesco Sor pt will be started on lovenox tomorrow.  Pt given first dose coumadin tonight.  Printed education materials about coumadin provided to pt.  Explained side effect, med interactions, signs/sx of bleeding, and nutrition information related to vitamin K intake.  Pt verbalized understanding of information.

## 2014-12-06 NOTE — Progress Notes (Signed)
PROGRESS NOTE    Date Time: 12/06/2014 11:17 AM  Patient Name: [name] [name],[name] [name]  Length of Stay: 4    Subjective:   Pt; I feel ok. My sinus feel better.Eating ok.    Medications:     Current Facility-Administered Medications   Medication Dose Route Frequency   . amoxicillin-clavulanate  1 tablet Oral Q12H SCH   . [START ON 12/07/2014] aspirin  325 mg Oral Daily   . atorvastatin  20 mg Oral Daily   . fluticasone  2 spray Each Nare Daily   . saline  2 spray Each Nare TID         Review of Systems:   A comprehensive review of systems was: General ROS: negative for - chills  ENT ROS: negative for - epistaxis  Respiratory ROS: negative for - hemoptysis  Cardiovascular ROS: negative for - loss of consciousness  Gastrointestinal ROS: negative for - diarrhea  Musculoskeletal ROS: negative for - joint swelling  Neurological ROS: negative for - confusion    Physical Exam:     Filed Vitals:    12/06/14 0800   BP: 134/74   Pulse: 84   Temp: 97.3 F (36.3 C)   Resp:    SpO2: 100%       Intake and Output Summary (Last 24 hours) at Date Time    Intake/Output Summary (Last 24 hours) at 12/06/14 1117  Last data filed at 12/05/14 1500   Gross per 24 hour   Intake    250 ml   Output      0 ml   Net    250 ml       Mental status - alert, oriented to person, place, and time  Eyes - pupils equal and reactive, extraocular eye movements intact  Ears - bilateral TM's and external ear canals normal  Nose - normal and patent, no erythema, discharge or polyps  Mouth - mucous membranes moist, pharynx normal without lesions  Neck - supple, no significant adenopathy  Lymphatics - no palpable lymphadenopathy  Chest - no tachypnea, retractions or cyanosis  Heart - S1 and S2 normal  Abdomen - no rebound tenderness noted  Extremities - no pedal edema noted    Labs:     Results     Procedure Component Value Units Date/Time    APTT [[id number]]  (Abnormal) Collected:  12/06/14 0922     PTT 123 (H) sec Updated:  12/06/14 0953     APTT Anticoag. Given w/i 48  hrs. Unknown     CBC [[id number]]  (Abnormal) Collected:  12/06/14 0830    Specimen Information:  Blood / Blood Updated:  12/06/14 0900     WBC 11.26 (H) x10 3/uL      Hgb 12.1 g/dL      Hematocrit 78.2 %      Platelets 303 x10 3/uL      RBC 4.01 (L) x10 6/uL      MCV 92.3 fL      MCH 30.2 pg      MCHC 32.7 g/dL      RDW 13 %      MPV 9.2 (L) fL      Nucleated RBC 0 /100 WBC     Narrative:      Per low intensity heparin protocol.    APTT [[id number]]  (Abnormal) Collected:  12/06/14 0105     PTT 43 (H) sec Updated:  12/06/14 0125     APTT Anticoag. Given w/i 48 hrs. heparin  APTT [[id number]]  (Abnormal) Collected:  12/05/14 1713     PTT 41 (H) sec Updated:  12/05/14 1751     APTT Anticoag. Given w/i 48 hrs. heparin           Recent CBC   Recent Labs      12/06/14   0830   RBC  4.01*   HEMOGLOBIN  12.1   HEMATOCRIT  37.0   MCV  92.3   MCH  30.2   MCHC  32.7   RDW  13   MPV  9.2*     Recent BMP No results for input(s): GLU, BUN, CREAT, CA, NA, K, CL, CO2 in the last 24 hours.    Invalid input(s): AGAP    Rads:   Radiological Procedure reviewed.    Assessment:   Acute CVA with multiple embolic at left MCA  Acute on chronic sinusitis   S/p Loop recorder implant    Plan:   Continue iv heparin  Continue asa and statin  Speech therapy follow up  Neurology follow up  TEE was done yesterday and not significant  Loop recorder was placed by cardiology  Pt/ot follow up  Continue Flonase  Continue  Augmentin  Continue current management  Lab in am      Signed by: Manuela Neptune

## 2014-12-06 NOTE — PT Progress Note (Signed)
[facility]  [address]  [place] [address]  [phone]    Physical Therapy Treatment    Patient:  [name] [name]        MRN#:  [id number]  Unit:  [facility] INTERMEDIATE CARE        Room/Bed:  MI627/MI627-01    Medical Diagnosis: Aphasia [R47.01]    Time of treatment:  PT Received On: 12/06/14  Start Time: 1420 Stop Time: 1450  Time Calculation (min): 30 min    Treatment #: PT Visit Number: 2/2    Patient's medical condition is appropriate for Physical Therapy intervention at this time.    Assessment   Pt met functional goals.  Demonstrates safe gait and good dynamic standing balance.  No continued PT indicated at this time.  D/C PT.      Plan   Recommendation  Discharge Recommendation: Home with supervision  PT Frequency: other (comment) (D/C PT )    Discharge from PT Acute Care Services.    Interdisciplinary Communication: discussed w/ RN    Subjective   "I would really like to get out of this bed."  Patient is agreeable to participation in the therapy session. Nursing clears patient for therapy.  Pain: 0/10    Vitals:   Filed Vitals:    12/06/14 0000 12/06/14 0407 12/06/14 0800 12/06/14 1200   BP: 115/56 121/70 134/74 127/74   Pulse:  64 84 80   Temp: 96.2 F (35.7 C) 95.5 F (35.3 C) 97.3 F (36.3 C) 96.6 F (35.9 C)   TempSrc: Oral Axillary  Oral   Resp: 18 18 16 18    Height:       Weight:       SpO2: 96% 97% 100% 97%     Objective     Precautions/ Contraindications:  falls    Patient is in bed with  Telemetry and Intravenous (IV) in place.    Functional Mobility:  Rolling: independent  Supine to Sit: independent  Scooting: independent  Sit to Supine: independent  Sit to Stand: independent  Stand to Sit: independent  Transfer Training: independent    Gait:   WB status: FWB  Assistive Device: none  Assist Level: independent  Distance: >1000'  Pattern: gait WFL   Stairs/Curbs: NT 2* IV     Balance Training:  Pt performed tandem gait 125' x 2 reps forward/backward.  Practiced SLS EO/EC BLE w/  symmetrical balance.      Educated the patient to role of physical therapy, plan of care, goals  of therapy and safety with mobility and ADLs.    Patient left amb around RN station.  RN aware with all needs met, equipment intact and call bell within reach. RN notified of session outcome.   Mobility status posted at bedside and within E.M.R.    Goals per Eval/ Re-eval:   Goals  Goal Formulation: With patient  Time for Goal Acheivement: 2 visits  Goals: Select goal  Pt Will Ambulate: > 200 feet, independent, to maximize functional mobility and independence, Goal met  Pt Will Go Up / Down Stairs: 1 flight, independent, to maximize functional mobility and independence, Goal met  Other Goal: RLE strength 5/5 (Goal Met)    Signature:  Prince Solian, PT  12/06/2014 2:50 PM   Phone: [phone]

## 2014-12-06 NOTE — Progress Notes (Addendum)
ptt ordered for 1115.  Lab has been listed as "in process" since 1154.  Called hematology to find out why lab hasn't resulted.  Heparin remains on hold, awaiting result.  Hematology states they will look into it and call RN back.    Update: Per lab, unable to locate original speciman.  ptt redrawn stat.  Result 32, since heparin gtt had been off for so long.  D/w Dr. Francesco Sor who gives verbal order to restart heparin gtt at previous rate of 950 units/hr and check ptt again in 6 hours.  Will restart and document in MAR.

## 2014-12-06 NOTE — Progress Notes (Signed)
Bari Edward, MD  [facility]  Tel:  [phone]            Assessment :     1. Acute CVA. Possibly embolic as per neuro. TTE and TEE unremarkable. .Pt currently on Linq. No Afib thus far. On heparin as per neurology   2. PSVT: Likely Atrial tach. Toprol XL 12.5 mg daily when ok from neurological standpoint  3. HPL; on Statins        AC as per neurology   No further IP cardiac recommendations. Will sign off .    Needs OP f/u with cardiology       Cardiology Diagnostics     Telemetry:  (I have personally reviewed the telemetry strips)   NSR, PSVT ( Atrial tach)    Echocardiogram: ( personally reviewed the report)  1. Normal biventricular size and systolic function with an estimated EF of 60-65%.  2. Grade 1 diastolic dysfunction  3. No significant valvular disease  4. No obvious intracardiac masses, thrombi or vegetations were appreciated but cannot be completely excluded.    Problem list     Patient Active Problem List   Diagnosis   . Aphasia            Events/ROS/ Subjective since last 24 hrs:         Resolving  right sided residual weakness and slurred spech      Objective:   Vitals reviewed     VS: BP 134/74 mmHg  Pulse 84  Temp(Src) 97.3 F (36.3 C) (Axillary)  Resp 16  Ht 1.676 m (5\' 6" )  Wt 61.236 kg (135 lb)  BMI 21.80 kg/m2  SpO2 100%  LMP 05/19/1992    Intake/Output Summary (Last 24 hours) at 12/06/14 1138  Last data filed at 12/05/14 1500   Gross per 24 hour   Intake    250 ml   Output      0 ml   Net    250 ml         Constitutional :  no acute distress,  Respiratory:  Good air movement and respiratory effort  bilaterally. No use of accessory muscles. Clear bilaterally. No rales orwheezes.  Cardiovascular: Regular . Nl S1 and S2..  2/6 SM at the LSB  No carotid bruits. No  JVD  Dorsal pedis 2+b/l.   No  edema.    Gastrointestinal Soft. Non-tender. Normoactive BS. No abdominal bruits  Psychiatric: AAO X3.  Normal mood and effect.    Laboratory Studies:   (I have personally  reviewed the laboratory values below)      CBC w/Diff     Recent Labs  Lab 12/06/14  0830 12/04/14  1153 12/04/14  0619   WBC 11.26* 16.06* 11.89*   HEMOGLOBIN 12.1 14.0 12.9   HEMATOCRIT 37.0 42.2 39.4   PLATELETS 303 290 324          Basic Metabolic Profile     Recent Labs  Lab 12/02/14  1941   SODIUM 138   POTASSIUM 3.7   CHLORIDE 101   CO2 29   BUN 18   CREATININE 0.8   EGFR >60.0   GLUCOSE 121*   CALCIUM 9.6            Cardiac Enzymes     Recent Labs  Lab 12/02/14  1941   CREATINE KINASE (CK) 93   TROPONIN I <0.01          Thyroid Studies  Invalid input(s): FREET4       Cholesterol Panel     Recent Labs  Lab 12/04/14  0619   CHOLESTEROL 161   TRIGLYCERIDES 47   HDL 65   CALCULATED LDL 87          Coagulation Studies     Recent Labs  Lab 12/06/14  0922 12/06/14  0105 12/05/14  1713  12/04/14  1153 12/02/14  1941   PT  --   --   --   --  13.4 12.9   PT INR  --   --   --   --  1.0 1.0   PTT 123* 43* 41* More results in Results Review 32 31   More results in Results Review = values in this interval not displayed.           Current Medications   Meds reviewed:    Current Facility-Administered Medications   Medication Dose Route Frequency   . amoxicillin-clavulanate  1 tablet Oral Q12H SCH   . [START ON 12/07/2014] aspirin  325 mg Oral Daily   . atorvastatin  20 mg Oral Daily   . fluticasone  2 spray Each Nare Daily   . saline  2 spray Each Nare TID                  Bari Edward, MD  12/06/2014 11:38 AM

## 2014-12-06 NOTE — Progress Notes (Signed)
Date Time: 12/06/2014 2:38 PM  Patient Name: [name] [name]  Attending Physician: Marcelino Freestone, MD  Patient Class: Inpatient  Hospital Day: 4            NEUROLOGY PROGRESS NOTE             Assessment/Plan   1. Multiple embolic-looking strokes in the left MCA distribution  2. High-grade left MCA, and ACA stenosis   ? Fresh  thrombus related  3. Worsening leukocytosis of unclear etiology, but she has remained afebrile  ?Sinusitis related already on antibiotic--white count.  Looks better today.     Continue IV heparin   Switched to Lovenox and Coumadin bridge tomorrow.   Start Coumadin tonight   Hopefully will be able to discharge her tomorrow.  Given the fact she has remained stable in spite of the high-grade intracranial stenosis    Subjective:   Patient Seen and Examined.  No significant change, remains stable.  No new worsening of symptoms    Review of Systems:     No headache, eye, ear nose, throat problems; no coughing or wheezing or shortness of breath, No chest pain or orthopnea, no abdominal pain, nausea or vomiting, No pain in the body or extremities, no psychiatric, neurological, endocrine, hematological or cardiac complaints except as noted above.      Physical Exam:   BP 127/74 mmHg  Pulse 80  Temp(Src) 96.6 F (35.9 C) (Oral)  Resp 18  Ht 1.676 m (5\' 6" )  Wt 61.236 kg (135 lb)  BMI 21.80 kg/m2  SpO2 97%  LMP 05/19/1992    Neuro:  Level of consciousness:  Alert and appropriate.  Speech is  fluent today, much better as compared to initial exam  Oriented:  X 3  Facial Movements: symmetric  pupils are equal and reactive   Strength:  No upper extremity drift---  Sensation to light touch: Intact bilaterally  Lungs clear.  Cardiac heart sounds normal.  Abdomen soft.  Extremities no edema, few areas of skin rash noted in the hands on the dorsal surface      Meds:      Scheduled Meds: PRN Meds:        amoxicillin-clavulanate 1 tablet Oral Q12H [facility]   [START ON 12/07/2014] aspirin 325 mg Oral Daily    atorvastatin 20 mg Oral Daily   fluticasone 2 spray Each Nare Daily   saline 2 spray Each Nare TID       Continuous Infusions:  . sodium chloride Stopped (12/05/14 1329)   . heparin 24401 units in dextrose 5% 500 mL 950 Units/hr (12/06/14 1424)   . lactated ringers        acetaminophen 650 mg Q4H PRN   heparin (porcine) 3,000 Units PRN   zolpidem 5 mg QHS PRN               Labs:       Recent Labs  Lab 12/02/14  1941   GLUCOSE 121*   BUN 18   CREATININE 0.8   CALCIUM 9.6   SODIUM 138   POTASSIUM 3.7   CHLORIDE 101   CO2 29   ALBUMIN 4.0   AST (SGOT) 20   ALT 21   BILIRUBIN, TOTAL 0.3   ALKALINE PHOSPHATASE 79       Recent Labs  Lab 12/06/14  0830 12/04/14  1153 12/04/14  0619   WBC 11.26* 16.06* 11.89*   HEMOGLOBIN 12.1 14.0 12.9   HEMATOCRIT 37.0 42.2 39.4   MCV 92.3  93.4 93.6   MCH 30.2 31.0 30.6   MCHC 32.7 33.2 32.7   PLATELETS 303 290 324         Recent Labs      12/06/14   1350  12/06/14   0922   12/04/14   1153   PTT  32  123*   < >  32   PT   --    --    --   13.4   PT INR   --    --    --   1.0    < > = values in this interval not displayed.          Radiology Results (24 Hour)     ** No results found for the last 24 hours. **           All brain imaging (MRI, CT) personally reviewed.    Case discussed with:  cardiology , physical therapy sarah  patient's nurse, Irving Burton       Signed by: Cathe Mons, MD  Spectralink: [phone]      Answering Service: [phone]

## 2014-12-07 LAB — BASIC METABOLIC PANEL
Anion Gap: 8 (ref 5.0–15.0)
BUN: 11 mg/dL (ref 7–19)
CO2: 28 mEq/L (ref 22–29)
Calcium: 8.4 mg/dL (ref 7.9–10.2)
Chloride: 106 mEq/L (ref 100–111)
Creatinine: 0.6 mg/dL (ref 0.6–1.0)
Glucose: 101 mg/dL — ABNORMAL HIGH (ref 70–100)
Potassium: 3.7 mEq/L (ref 3.5–5.1)
Sodium: 142 mEq/L (ref 136–145)

## 2014-12-07 LAB — CBC AND DIFFERENTIAL
Basophils Absolute Automated: 0.07 10*3/uL (ref 0.00–0.20)
Basophils Automated: 1 %
Eosinophils Absolute Automated: 0.42 10*3/uL (ref 0.00–0.70)
Eosinophils Automated: 6 %
Hematocrit: 36.1 % — ABNORMAL LOW (ref 37.0–47.0)
Hgb: 12 g/dL (ref 12.0–16.0)
Immature Granulocytes Absolute: 0.02 10*3/uL
Immature Granulocytes: 0 %
Lymphocytes Absolute Automated: 1.33 10*3/uL (ref 0.50–4.40)
Lymphocytes Automated: 19 %
MCH: 30.5 pg (ref 28.0–32.0)
MCHC: 33.2 g/dL (ref 32.0–36.0)
MCV: 91.6 fL (ref 80.0–100.0)
MPV: 9 fL — ABNORMAL LOW (ref 9.4–12.3)
Monocytes Absolute Automated: 0.72 10*3/uL (ref 0.00–1.20)
Monocytes: 10 %
Neutrophils Absolute: 4.61 10*3/uL (ref 1.80–8.10)
Neutrophils: 64 %
Nucleated RBC: 0 /100 WBC (ref 0–1)
Platelets: 308 10*3/uL (ref 140–400)
RBC: 3.94 10*6/uL — ABNORMAL LOW (ref 4.20–5.40)
RDW: 13 % (ref 12–15)
WBC: 7.15 10*3/uL (ref 3.50–10.80)

## 2014-12-07 LAB — PT/INR
PT INR: 1.1 (ref 0.9–1.1)
PT: 14 s (ref 12.6–15.0)

## 2014-12-07 LAB — APTT: PTT: 105 s — ABNORMAL HIGH (ref 23–37)

## 2014-12-07 LAB — GFR: EGFR: >60

## 2014-12-07 MED ORDER — ENOXAPARIN SODIUM 60 MG/0.6ML SC SOLN
1.0000 mg/kg | Freq: Two times a day (BID) | SUBCUTANEOUS | Status: DC
Start: 2014-12-07 — End: 2014-12-07
  Administered 2014-12-07: 60 mg via SUBCUTANEOUS
  Filled 2014-12-07: qty 0.6

## 2014-12-07 MED ORDER — WARFARIN SODIUM 5 MG PO TABS
5.0000 mg | ORAL_TABLET | Freq: Every day | ORAL | Status: DC
Start: 2014-12-07 — End: 2014-12-07

## 2014-12-07 MED ORDER — FLUTICASONE PROPIONATE 50 MCG/ACT NA SUSP
2.0000 | Freq: Two times a day (BID) | NASAL | Status: AC
Start: 2014-12-07 — End: 2014-12-14

## 2014-12-07 MED ORDER — METOPROLOL SUCCINATE ER 50 MG PO TB24
12.5000 mg | ORAL_TABLET | Freq: Every day | ORAL | Status: DC
Start: 2014-12-07 — End: 2014-12-07

## 2014-12-07 MED ORDER — WARFARIN SODIUM 5 MG PO TABS
5.0000 mg | ORAL_TABLET | Freq: Every day | ORAL | Status: AC
Start: 2014-12-07 — End: 2015-01-06

## 2014-12-07 MED ORDER — ATORVASTATIN CALCIUM 20 MG PO TABS
20.0000 mg | ORAL_TABLET | Freq: Every day | ORAL | Status: AC
Start: 2014-12-07 — End: 2015-01-06

## 2014-12-07 MED ORDER — AMOXICILLIN-POT CLAVULANATE 875-125 MG PO TABS
1.0000 | ORAL_TABLET | Freq: Two times a day (BID) | ORAL | Status: AC
Start: 2014-12-07 — End: 2014-12-12

## 2014-12-07 MED ORDER — ENOXAPARIN SODIUM 60 MG/0.6ML SC SOLN
1.0000 mg/kg | Freq: Two times a day (BID) | SUBCUTANEOUS | Status: AC
Start: 2014-12-07 — End: 2014-12-17

## 2014-12-07 MED ORDER — METOPROLOL SUCCINATE ER 25 MG PO TB24
12.5000 mg | ORAL_TABLET | Freq: Every day | ORAL | Status: DC
Start: 2014-12-07 — End: 2015-01-04

## 2014-12-07 NOTE — Progress Notes (Signed)
Bari Edward, MD  [facility]  Tel:  [phone]            Assessment :     1. Acute CVA. Possibly embolic as per neuro. TTE and TEE unremarkable. .Pt currently on Linq. No Afib thus far. On lovenox and Coumadin as per neuro  2. PSVT: Likely Atrial tach. Toprol XL 12.5 mg daily on d/c   3. HPL; on Statins      No further IP cardiac recommendations. Will sign off . Please call with questions  .Needs OP f/u with cardiology   And INR monitoring    Cardiology Diagnostics     Telemetry:  (I have personally reviewed the telemetry strips)   NSR, PSVT ( Atrial tach)    Echocardiogram: ( personally reviewed the report)  1. Normal biventricular size and systolic function with an estimated EF of 60-65%.  2. Grade 1 diastolic dysfunction  3. No significant valvular disease  4. No obvious intracardiac masses, thrombi or vegetations were appreciated but cannot be completely excluded.    Problem list     Patient Active Problem List   Diagnosis   . Aphasia            Events/ROS/ Subjective since last 24 hrs:         Resolving  right sided residual weakness and slurred spech      Objective:   Vitals reviewed     VS: BP 137/65 mmHg  Pulse 86  Temp(Src) 98.2 F (36.8 C) (Oral)  Resp 18  Ht 1.676 m (5\' 6" )  Wt 61.236 kg (135 lb)  BMI 21.80 kg/m2  SpO2 97%  LMP 05/19/1992  No intake or output data in the 24 hours ending 12/07/14 1335      Constitutional :  no acute distress,  Respiratory:  Good air movement and respiratory effort  bilaterally. No use of accessory muscles. Clear bilaterally. No rales orwheezes.  Cardiovascular: Regular . Nl S1 and S2..  2/6 SM at the LSB  No carotid bruits. No  JVD  Dorsal pedis 2+b/l.   No  edema.    Gastrointestinal Soft. Non-tender. Normoactive BS. No abdominal bruits  Psychiatric: AAO X3.  Normal mood and effect.    Laboratory Studies:   (I have personally reviewed the laboratory values below)      CBC w/Diff     Recent Labs  Lab 12/07/14  0800 12/06/14  0830  12/04/14  1153   WBC 7.15 11.26* 16.06*   HEMOGLOBIN 12.0 12.1 14.0   HEMATOCRIT 36.1* 37.0 42.2   PLATELETS 308 303 290          Basic Metabolic Profile     Recent Labs  Lab 12/07/14  0800 12/02/14  1941   SODIUM 142 138   POTASSIUM 3.7 3.7   CHLORIDE 106 101   CO2 28 29   BUN 11 18   CREATININE 0.6 0.8   EGFR >60.0 >60.0   GLUCOSE 101* 121*   CALCIUM 8.4 9.6            Cardiac Enzymes     Recent Labs  Lab 12/02/14  1941   CREATINE KINASE (CK) 93   TROPONIN I <0.01          Thyroid Studies         Invalid input(s): FREET4       Cholesterol Panel     Recent Labs  Lab 12/04/14  0619   CHOLESTEROL 161   TRIGLYCERIDES  47   HDL 65   CALCULATED LDL 87          Coagulation Studies     Recent Labs  Lab 12/07/14  0800 12/06/14  1923 12/06/14  1350  12/04/14  1153   PT 14.0  --  12.8  --  13.4   PT INR 1.1  --  1.0  --  1.0   PTT 105* 65* 32 More results in Results Review 32   More results in Results Review = values in this interval not displayed.           Current Medications   Meds reviewed:    Current Facility-Administered Medications   Medication Dose Route Frequency   . amoxicillin-clavulanate  1 tablet Oral Q12H SCH   . aspirin  325 mg Oral Daily   . atorvastatin  20 mg Oral Daily   . enoxaparin  1 mg/kg Subcutaneous Q12H SCH   . fluticasone  2 spray Each Nare Daily   . saline  2 spray Each Nare TID   . warfarin  5 mg Oral Daily at 1800                  Bari Edward, MD  12/07/2014 1:35 PM

## 2014-12-07 NOTE — Discharge Summary -  Nursing (Signed)
Patient  Alert and oriented  and being discharged home this evening. Vital signs within normal limits. No physical deficits r/t to CVA observed at this time. Medications and side effects completed. Also discussed  follow-up needs with community  providers. Discharge instruction teaching including side effects of coumadin and lovenox completed. Discharge teaching also for CVA completed with education materials handed to patient Patient verbalized complete understanding of teaching done.  IV access and telemetry were discontinued before patient was transported to lobby.

## 2014-12-07 NOTE — Progress Notes (Signed)
The patient and caregiver's learning abilities have been assessed. Today's individualized plan of care includes anticoagulation medication including coumadin and Lovenox bridging at home, exercise, stroke signs and symptoms and how to immediately access emergency medical system. The plan of care was discussed with the patient and caregiver, who agree to it and demonstrate understanding of the disease process, treatment plan, medications and consequences of noncompliance. All questions and concerns were addressed and stroke information reinforced with CareNotes printout at bedside.Marland Kitchen

## 2014-12-07 NOTE — Progress Notes (Signed)
MODIFIED RANKIN SCALE (MRS)    Score Description    0-  No symptoms at all    1-  No significant disability despite symptoms; able to carry out all usual duties and activities    2-  Slight disability; unable to carry out all previous activities, but able to look after own affairs without assistance    3-  Moderate disability; requiring some help, but able to walk without assistance    4-  Moderately severe disability; unable to walk without assistance and unable to attend to own bodily needs without assistance    5-  Severe disability; bedridden, incontinent and requiring constant nursing care and attention    6-  Dead      TOTAL (0-6): 0-  No symptoms at all       Completed by:

## 2014-12-07 NOTE — Progress Notes (Signed)
Enoxaparin and warfarin teaching completed with patient, and education material provided. Enoxaparin injection technique was demonstrated for the patient. Enoxaparin indication, dosing regimen, missed doses, and potential side effects were also reviewed. INR monitoring and enoxaparin bridging with warfarin were explained. Warfarin indication and goal INR, dosing regimen, missed doses, diet and drug interactions, and monitoring for signs and symptoms of bleeding were also reviewed. All questions were answered and patient expressed they understood the reviewed material. The patient appears to have a good level of understanding.   Ancil Linsey, PharmD Candidate  T. Geoffery Spruce, PharmD Candidate  T. Lendell Caprice, PharmD  12/07/14

## 2014-12-07 NOTE — Discharge Instructions (Addendum)
Follow up with primary care doctor in 1 week.  Follow up with cardiologist and neurologist in 2-3 week.

## 2014-12-07 NOTE — Plan of Care (Signed)
Problem: Day 4- Stroke  Goal: Patient/Patient Care Companion demonstrates understanding on disease process, treatment plan, medications, discharge plan and consequences of noncompliance  Outcome: Progressing  The patient and caregiver's learning abilities have been assessed. Today's individualized plan of care includes monitoring neurological signs of stroke, encouraging mobility, reinforcing education on coumadin. The plan of care was discussed with the patient and caregiver, who agree to it and demonstrate understanding of the disease process, treatment plan, medications and consequences of noncompliance. All questions and concerns were addressed.    Patient is alert, verbalizes needs, denies pain at this time, heparin gtt infusing, no change to rate due to therapeutic range. Neuro status is stable. When continue to monitor VS, labs, and provide safety measures.

## 2014-12-07 NOTE — Progress Note - Problem Oriented Charting Notewrit (Signed)
Referral from Joanne Chars, RN CM for home health follow up for RN visits for lab draws (PT/inr's). Met with patient who stated she is going back to work on Monday. Called Dr. Elpidio Eric office to inquire about getting labwork done there as an outpatient and they stated they can provide this service for her. She will get her PT/inr lab drawn on Friday 3/4 and Monday 3/7 at the office. Melissa updated as well as Dr. Holley Dexter.     Defer for home health services.

## 2014-12-07 NOTE — Plan of Care (Signed)
Problem: Health Promotion  Goal: Knowledge - health resources  Extent of understanding and conveyed about healthcare resources.   Outcome: Progressing  Patient alert and oriented x4 at this time. Denies unusual discomfort. Health promotion, pain and safety needs discussed and addressed. Education materials on disease process and symptoms provided.Extensive teaching on medications and update on scheduled procedures completed. Also updated on interventions associated with this admission. GCS assessment completed,patient alert, oriented and verbally appropriate.No acute physical deficits observed.Vital signs stable, patient also denied pan or any other discomfort.

## 2014-12-07 NOTE — Anesthesia Postprocedure Evaluation (Signed)
Anesthesia Post Evaluation    Patient: [name] [name]    Procedures performed: Procedure(s):  ECHOCARDIOGRAM, TRANSESOPHAGEAL    Anesthesia type: General TIVA    Patient location:Telemetry/Step Down Unit    Last vitals:   Filed Vitals:    12/07/14 0806   BP: 128/60   Pulse: 89   Temp: 36.5 C (97.7 F)   Resp: 20   SpO2: 98%       Post pain: Patient not complaining of pain, continue current therapy      Mental Status:awake    Respiratory Function: tolerating room air    Cardiovascular: stable    Nausea/Vomiting: patient not complaining of nausea or vomiting    Hydration Status: adequate    Post assessment: no apparent anesthetic complications and no reportable events

## 2014-12-07 NOTE — Discharge Summary (Signed)
DISCHARGE NOTE    Date Time: 12/07/2014 3:49 PM  Patient Name: [name] [name]  Attending Physician: Marcelino Freestone, MD    Date of Admission:   12/02/2014    Date of Discharge:   12/07/2014    Reason for Admission:   Aphasia [R47.01]    Problems:   Chronic sinusitis   Osteroposis         Discharge Dx:   1.Acute CVA with multiple embolic at left MCA (TEE and echo not significant. No significant deficit.On coumadin with lovenox bridging. Follow up PMD and neurology office)  2. S/p Acute on chronic sinusitis (abx augmentin total 7 days)  3. S/p Loop recorder implant (follow up cardiology office)  4. HLD omn statin  5. S/p PSVT (on toprol 12.5 po daily)      Consultations:   Treatment Team: Attending Provider: Marcelino Freestone, MD; Surgeon: Bari Edward, MD; Registered Nurse: Barbette Or, RN; Technician: Normajean Baxter; Case Manager: Nicky Pugh, RN       Hospital Course:    This is 74 y.o. 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 Yesterday morning had issues getting words out during an argument with husband, then at work had similar problem during a phone call. In PM when talking to son over phone, had the issue again. Work up in ED is unremarkable. She denies any weakness, numbness, hearing deficits, or vision changes. She has been dealing with a sinus infection. Pt was admitted to im[facility] and found to be have multiple embolic stoke with significant deficit and start on initially asa and later change to heparin with coumadin, TEE and TTE done that was not significant and placed on loop recorder per cardiology. Today pt is doing well and as ok from all consultants will be d.c home with coumadin with lovenox bridging.    Discharge Medications:     Current Discharge Medication List      START taking these medications    Details   amoxicillin-clavulanate (AUGMENTIN) 875-125 MG per tablet Take 1 tablet by mouth every 12 (twelve) hours.  Qty: 10 tablet, Refills: 0      atorvastatin  (LIPITOR) 20 MG tablet Take 1 tablet (20 mg total) by mouth daily.  Qty: 30 tablet, Refills: 0      enoxaparin (LOVENOX) 60 MG/0.6ML Solution Inject 0.61 mLs (61 mg total) into the skin every 12 (twelve) hours.  Qty: 12.2 mL, Refills: 0      fluticasone (FLONASE) 50 MCG/ACT nasal spray 2 sprays by Nasal route 2 (two) times daily.  Qty: 1 Bottle, Refills: 0      metoprolol XL (TOPROL-XL) 25 MG 24 hr tablet Take 0.5 tablets (12.5 mg total) by mouth daily.  Qty: 15 tablet, Refills: 0      warfarin (COUMADIN) 5 MG tablet Take 1 tablet (5 mg total) by mouth daily.  Qty: 30 tablet, Refills: 0         CONTINUE these medications which have NOT CHANGED    Details   calcium citrate (CALCITRATE) 950 MG tablet Take 1 tablet by mouth 2 (two) times daily.         Cholecalciferol (VITAMIN D) 1000 UNIT tablet Take 1,000 Units by mouth daily.      Multiple Vitamins-Minerals (VITEYES AREDS FORMULA/LUTEIN PO) Take 2 tablets by mouth 2 (two) times daily.         raloxifene (EVISTA) 60 MG tablet Take 60 mg by mouth daily.  STOP taking these medications       aspirin 81 MG tablet                Discharge Instructions:   Diet; Low cholesterol    Activity: as tolrate     Follow-up with internal medicine in 1 week.Cardiology office 2-3 week.Neurology office 2-3 week      Signed by: Marcelino Freestone, MD

## 2014-12-07 NOTE — Progress Notes (Signed)
Date Time: 12/07/2014 5:15 PM  Patient Name: [name]  Attending Physician: No att. providers found  Patient Class: Inpatient  Hospital Day: 5            NEUROLOGY PROGRESS NOTE             Assessment/Plan   1. Multiple embolic-looking strokes in the left MCA distribution  2. High-grade left MCA, and ACA stenosis   ? Fresh  thrombus related  3. Worsening leukocytosis of unclear etiology, but she has remained afebrile  ?Sinusitis related already on antibiotic--white count.  Looks better today.     Switched to Lovenox and Coumadin bridge at home    Outpatient follow-up with me in a few weeks   Okay to discharge when she has remained stable in terms of neuro deficits    Subjective:   Patient Seen and Examined.  No significant change, remains stable.  No new worsening of symptoms    Review of Systems:     No headache, eye, ear nose, throat problems; no coughing or wheezing or shortness of breath, No chest pain or orthopnea, no abdominal pain, nausea or vomiting, No pain in the body or extremities, no psychiatric, neurological, endocrine, hematological or cardiac complaints except as noted above.      Physical Exam:   BP 137/65 mmHg  Pulse 86  Temp(Src) 98.2 F (36.8 C) (Oral)  Resp 18  Ht 1.676 m (5\' 6" )  Wt 61.236 kg (135 lb)  BMI 21.80 kg/m2  SpO2 97%  LMP 05/19/1992    Neuro:  Level of consciousness:  Alert and appropriate.  Speech is  fluent today, much better as compared to initial exam.  No adverse effects  Oriented:  X 3  Facial Movements: symmetric  pupils are equal and reactive   Strength:  No upper extremity drift--- noted.  Sensation to light touch: Intact bilaterally  Lungs clear.    Cardiac heart sounds normal.    Abdomen soft.    Extremities no edema, few areas of skin rash noted in the hands on the dorsal surface--remains unchanged      Meds:      Scheduled Meds: PRN Meds:        amoxicillin-clavulanate 1 tablet Oral Q12H [facility]   aspirin 325 mg Oral Daily   atorvastatin 20 mg Oral Daily    enoxaparin 1 mg/kg Subcutaneous Q12H [facility]   fluticasone 2 spray Each Nare Daily   metoprolol XL 12.5 mg Oral Daily   saline 2 spray Each Nare TID   warfarin 5 mg Oral Daily at 1800       Continuous Infusions:      acetaminophen 650 mg Q4H PRN   zolpidem 5 mg QHS PRN               Labs:       Recent Labs  Lab 12/07/14  0800 12/02/14  1941   GLUCOSE 101* 121*   BUN 11 18   CREATININE 0.6 0.8   CALCIUM 8.4 9.6   SODIUM 142 138   POTASSIUM 3.7 3.7   CHLORIDE 106 101   CO2 28 29   ALBUMIN  --  4.0   AST (SGOT)  --  20   ALT  --  21   BILIRUBIN, TOTAL  --  0.3   ALKALINE PHOSPHATASE  --  79       Recent Labs  Lab 12/07/14  0800 12/06/14  0830 12/04/14  1153   WBC 7.15 11.26* 16.06*  HEMOGLOBIN 12.0 12.1 14.0   HEMATOCRIT 36.1* 37.0 42.2   MCV 91.6 92.3 93.4   MCH 30.5 30.2 31.0   MCHC 33.2 32.7 33.2   PLATELETS 308 303 290         Recent Labs      12/07/14   0800  12/06/14   1923  12/06/14   1350   PTT  105*  65*  32   PT  14.0   --   12.8   PT INR  1.1   --   1.0          Radiology Results (24 Hour)     ** No results found for the last 24 hours. **           All brain imaging (MRI, CT) personally reviewed.    Case discussed with: Cardiology, patient, patient's husband.   Signed by: Cathe Mons, MD  Spectralink: [id number]      Answering Service: [phone]

## 2014-12-08 ENCOUNTER — Encounter (INDEPENDENT_AMBULATORY_CARE_PROVIDER_SITE_OTHER): Payer: Self-pay

## 2014-12-12 ENCOUNTER — Ambulatory Visit (INDEPENDENT_AMBULATORY_CARE_PROVIDER_SITE_OTHER): Payer: Medicare Other | Admitting: Cardiovascular Disease

## 2014-12-12 DIAGNOSIS — I639 Cerebral infarction, unspecified: Secondary | ICD-10-CM | POA: Diagnosis not present

## 2014-12-19 DIAGNOSIS — I639 Cerebral infarction, unspecified: Secondary | ICD-10-CM | POA: Diagnosis not present

## 2014-12-26 DIAGNOSIS — I639 Cerebral infarction, unspecified: Secondary | ICD-10-CM | POA: Diagnosis not present

## 2014-12-27 DIAGNOSIS — Z86711 Personal history of pulmonary embolism: Secondary | ICD-10-CM | POA: Diagnosis not present

## 2014-12-27 DIAGNOSIS — R4701 Aphasia: Secondary | ICD-10-CM | POA: Diagnosis not present

## 2015-01-04 ENCOUNTER — Ambulatory Visit (INDEPENDENT_AMBULATORY_CARE_PROVIDER_SITE_OTHER): Payer: Medicare Other | Admitting: Cardiovascular Disease

## 2015-01-04 ENCOUNTER — Encounter (INDEPENDENT_AMBULATORY_CARE_PROVIDER_SITE_OTHER): Payer: Self-pay | Admitting: Cardiovascular Disease

## 2015-01-04 VITALS — BP 148/81 | HR 99 | Resp 18 | Ht 66.0 in | Wt 136.0 lb

## 2015-01-04 DIAGNOSIS — I639 Cerebral infarction, unspecified: Secondary | ICD-10-CM | POA: Insufficient documentation

## 2015-01-04 DIAGNOSIS — E7800 Pure hypercholesterolemia, unspecified: Secondary | ICD-10-CM

## 2015-01-04 DIAGNOSIS — I471 Supraventricular tachycardia: Secondary | ICD-10-CM

## 2015-01-04 MED ORDER — METOPROLOL SUCCINATE ER 25 MG PO TB24
25.0000 mg | ORAL_TABLET | Freq: Every day | ORAL | Status: AC
Start: 2015-01-04 — End: 2015-02-03

## 2015-01-04 NOTE — Progress Notes (Signed)
[facility] Cardiology office visit      I had the pleasure of seeing [name] [name] today for cardiovascular follow up. She is a pleasant 74 y.o. with recent CVA , PSVT , HPL     She has a linq monitor implanted  With no arrhythmias recordes  She  denies any  chest pain,  angina on exertion, dyspnea on exertion, palpitations, lower extremity edema, orthopnea, paroxysmal nocturnal dyspnea, or syncope .  Occasional numbness in the first 2 right fingers    MEDICATIONS: She has a current medication list which includes the following prescription(s): calcium citrate - Take 1 tablet by mouth 2 (two) times daily.   , vitamin d - Take 1,000 Units by mouth daily, metoprolol xl - Take 1 tablet (25 mg total) by mouth daily, and raloxifene - Take 60 mg by mouth daily.    REVIEW OF SYSTEMS: All other systems reviewed and negative except as above.    PHYSICAL EXAMINATION  General Appearance: A well-appearing woman in no acute distress.    Vital Signs: BP 148/81 mmHg  Pulse 99  Resp 18  Ht 1.676 m (5\' 6" )  Wt 61.689 kg (136 lb)  BMI 21.96 kg/m2  LMP 05/19/1992      HEENT: Sclera anicteric, conjunctiva without pallor, moist mucous membranes, normal dentition.   Neck: Supple without jugular venous distention. Thyroid nonpalpable. No carotid bruits.  Chest: Good air movement and respiratory effort.  Clear to auscultation bilaterally. No wheezes, rales, or rhonchi  Cardiovascular: Normal S1 and S2 without murmurs, gallops or rub. PMI nondisplaced.   Abdomen: Soft, nontender.  No bruits.    Extremities: Warm .  No  edema.  DP 2+ b/l   Skin: No rash,  Neuro: Alert. Grossly intact.  Psych: Normal mood and affect.       ECG:  normal sinus rhythm at  79 bpm, normal axis and intervals, no significant ST/T changes, normal ECG, no significant change from prior        ASSESSMENT AND PLAN:  1. Acute CVA. Unremarkable . TTE and TEE unremarkable. .Pt currently on Linq. No Afib/flutter thus far. Possibly embolic and hence on  Coumadin as per neuro  2. HTN: Increase Toprol XL.  3. PSVT : asymptomatic , Monitor on BB  4. HPL; on Statins. Check lipids      All questions regarding cardiovascular diseases were answered during this encounter.    Orders Placed This Encounter   Procedures   . ECG 12 lead (Today)     Return in about 3 months (around 04/06/2015).        Bari Edward, MD

## 2015-01-05 ENCOUNTER — Encounter (INDEPENDENT_AMBULATORY_CARE_PROVIDER_SITE_OTHER): Payer: Self-pay | Admitting: Cardiovascular Disease

## 2015-01-09 DIAGNOSIS — M810 Age-related osteoporosis without current pathological fracture: Secondary | ICD-10-CM | POA: Diagnosis not present

## 2015-01-09 DIAGNOSIS — I639 Cerebral infarction, unspecified: Secondary | ICD-10-CM | POA: Diagnosis not present

## 2015-01-16 DIAGNOSIS — I639 Cerebral infarction, unspecified: Secondary | ICD-10-CM | POA: Diagnosis not present

## 2015-01-17 DIAGNOSIS — Z09 Encounter for follow-up examination after completed treatment for conditions other than malignant neoplasm: Secondary | ICD-10-CM | POA: Diagnosis not present

## 2015-01-17 DIAGNOSIS — I639 Cerebral infarction, unspecified: Secondary | ICD-10-CM | POA: Diagnosis not present

## 2015-01-24 DIAGNOSIS — H40021 Open angle with borderline findings, high risk, right eye: Secondary | ICD-10-CM | POA: Diagnosis not present

## 2015-01-24 DIAGNOSIS — H4011X1 Primary open-angle glaucoma, mild stage: Secondary | ICD-10-CM | POA: Diagnosis not present

## 2015-01-26 ENCOUNTER — Other Ambulatory Visit: Payer: Self-pay | Admitting: Internal Medicine

## 2015-01-26 DIAGNOSIS — Z853 Personal history of malignant neoplasm of breast: Secondary | ICD-10-CM

## 2015-01-30 DIAGNOSIS — I639 Cerebral infarction, unspecified: Secondary | ICD-10-CM | POA: Diagnosis not present

## 2015-02-06 ENCOUNTER — Ambulatory Visit
Admission: RE | Admit: 2015-02-06 | Discharge: 2015-02-06 | Disposition: A | Discharge status: Home / Self Care | Payer: Medicare Other | Source: Ambulatory Visit | Attending: Internal Medicine | Admitting: Internal Medicine

## 2015-02-06 ENCOUNTER — Other Ambulatory Visit: Payer: Self-pay | Admitting: Internal Medicine

## 2015-02-06 DIAGNOSIS — Z1231 Encounter for screening mammogram for malignant neoplasm of breast: Secondary | ICD-10-CM | POA: Insufficient documentation

## 2015-02-06 DIAGNOSIS — Z853 Personal history of malignant neoplasm of breast: Secondary | ICD-10-CM

## 2015-02-06 DIAGNOSIS — R921 Mammographic calcification found on diagnostic imaging of breast: Secondary | ICD-10-CM | POA: Diagnosis not present

## 2015-02-06 DIAGNOSIS — R928 Other abnormal and inconclusive findings on diagnostic imaging of breast: Secondary | ICD-10-CM | POA: Diagnosis not present

## 2015-02-20 DIAGNOSIS — I639 Cerebral infarction, unspecified: Secondary | ICD-10-CM | POA: Diagnosis not present

## 2015-03-08 DIAGNOSIS — C50411 Malignant neoplasm of upper-outer quadrant of right female breast: Secondary | ICD-10-CM | POA: Diagnosis not present

## 2015-03-08 DIAGNOSIS — Z171 Estrogen receptor negative status [ER-]: Secondary | ICD-10-CM | POA: Diagnosis not present

## 2015-03-09 DIAGNOSIS — H40021 Open angle with borderline findings, high risk, right eye: Secondary | ICD-10-CM | POA: Diagnosis not present

## 2015-03-09 DIAGNOSIS — H4011X1 Primary open-angle glaucoma, mild stage: Secondary | ICD-10-CM | POA: Diagnosis not present

## 2015-03-20 DIAGNOSIS — I639 Cerebral infarction, unspecified: Secondary | ICD-10-CM | POA: Diagnosis not present

## 2015-03-22 ENCOUNTER — Encounter (INDEPENDENT_AMBULATORY_CARE_PROVIDER_SITE_OTHER): Payer: Self-pay | Admitting: Cardiovascular Disease

## 2015-03-22 ENCOUNTER — Ambulatory Visit (INDEPENDENT_AMBULATORY_CARE_PROVIDER_SITE_OTHER): Payer: Medicare Other | Admitting: Cardiovascular Disease

## 2015-03-22 VITALS — BP 139/86 | HR 80 | Resp 17 | Ht 66.0 in | Wt 136.2 lb

## 2015-03-22 DIAGNOSIS — E7800 Pure hypercholesterolemia, unspecified: Secondary | ICD-10-CM

## 2015-03-22 DIAGNOSIS — I471 Supraventricular tachycardia: Secondary | ICD-10-CM

## 2015-03-22 NOTE — Progress Notes (Signed)
[facility] Cardiology office visit      I had the pleasure of seeing [name] [name] today for cardiovascular follow up. She is a pleasant 74 y.o. with recent CVA , PSVT , HPL , PAF    She has a linq monitor implanted  , most recent review of the monitor shows 2 episodes of atrial fibrillation .  She  denies any  chest pain,  angina on exertion, dyspnea on exertion, palpitations, lower extremity edema, orthopnea, paroxysmal nocturnal dyspnea, or syncope .      MEDICATIONS: She has a current medication list which includes the following prescription(s): atorvastatin - Take 20 mg by mouth daily, calcium citrate - Take 1 tablet by mouth 2 (two) times daily.   , vitamin d - Take 1,000 Units by mouth daily, metoprolol xl - Take 25 mg by mouth daily, raloxifene - Take 60 mg by mouth daily, and warfarin - Take 5 mg by mouth daily.    REVIEW OF SYSTEMS: All other systems reviewed and negative except as above.    PHYSICAL EXAMINATION  General Appearance: A well-appearing woman in no acute distress.    Vital Signs: BP 139/86 mmHg  Pulse 80  Resp 17  Ht 1.676 m (5\' 6" )  Wt 61.78 kg (136 lb 3.2 oz)  BMI 21.99 kg/m2  LMP 05/19/1992      HEENT: Sclera anicteric, conjunctiva without pallor, moist mucous membranes, normal dentition.   Neck: Supple without jugular venous distention. Thyroid nonpalpable. No carotid bruits.  Chest: Good air movement and respiratory effort.  Clear to auscultation bilaterally. No wheezes, rales, or rhonchi  Cardiovascular: Normal S1 and S2 without murmurs, gallops or rub. PMI nondisplaced.   Abdomen: Soft, nontender.  No bruits.    Extremities: Warm .  No  edema.  DP 2+ b/l   Skin: No rash,  Neuro: Alert. Grossly intact.  Psych: Normal mood and affect.       ECG:  normal sinus rhythm at  71 bpm, normal axis and intervals, no significant ST/T changes    ECHO: 11/2014  1. Normal biventricular size and systolic function with an estimated EF  of 60-65%.  2. Grade 1 diastolic dysfunction  3.  No significant valvular disease  4. No obvious intracardiac masses, thrombi or vegetations were  appreciated but cannot be completely excluded.    TEE 11/2014  1. Normal biventricular size and function. Estimated left ventricular  ejection fraction is 60-65%  2. No significant valvular disease.  3. No left atrial and Left atrial appendage thrombus. No atheroma seen  in the ascending aorta, arch and proximal portion of descending aorta.  No obvious cardiac source of emboli      ASSESSMENT AND PLAN:  1. Acute CVA.  Embolic Unremarkable . TTE and TEE unremarkable. .Pt currently on Linq.  Does have  afib on Linq. Continue Coumadin  2. HTN:  Stable  3. PSVT : asymptomatic , Monitor on BB  4. HPL: on Statins. Check lipids      All questions regarding cardiovascular diseases were answered during this encounter.    Orders Placed This Encounter   Procedures   . Lipid panel   . ECG 12 lead (Today)     Return in about 3 months (around 06/22/2015).        Bari Edward, MD

## 2015-03-23 ENCOUNTER — Encounter (INDEPENDENT_AMBULATORY_CARE_PROVIDER_SITE_OTHER): Payer: Self-pay | Admitting: Cardiovascular Disease

## 2015-03-23 ENCOUNTER — Telehealth (INDEPENDENT_AMBULATORY_CARE_PROVIDER_SITE_OTHER): Payer: Self-pay | Admitting: Cardiovascular Disease

## 2015-04-03 DIAGNOSIS — I639 Cerebral infarction, unspecified: Secondary | ICD-10-CM | POA: Diagnosis not present

## 2015-04-07 ENCOUNTER — Encounter (INDEPENDENT_AMBULATORY_CARE_PROVIDER_SITE_OTHER): Payer: Self-pay

## 2015-04-07 ENCOUNTER — Ambulatory Visit (INDEPENDENT_AMBULATORY_CARE_PROVIDER_SITE_OTHER): Payer: Medicare Other | Admitting: Cardiovascular Disease

## 2015-04-07 DIAGNOSIS — Z4509 Encounter for adjustment and management of other cardiac device: Secondary | ICD-10-CM

## 2015-04-07 NOTE — Progress Notes (Signed)
[facility] Cardiology [facility]  Loop Follow-Up    Date of remote interrogation:  04/07/2015    Linq  Monitoring: 04 Mar 2015  to 05 April 2015    Comments: 4 episodes of A fib with RVR noted   29 March 2015: 5S  12 March 2015  5 S  04 April 2015 1 min 5 S  04 Mar 2015  39 S       Conclusion:  NSR  Intermittent A fib with RVR    Reviewed and electronically signed by  .Bari Edward, MD

## 2015-04-14 DIAGNOSIS — E785 Hyperlipidemia, unspecified: Secondary | ICD-10-CM | POA: Diagnosis not present

## 2015-05-01 ENCOUNTER — Other Ambulatory Visit (INDEPENDENT_AMBULATORY_CARE_PROVIDER_SITE_OTHER): Payer: Self-pay | Admitting: Cardiovascular Disease

## 2015-05-01 DIAGNOSIS — I159 Secondary hypertension, unspecified: Secondary | ICD-10-CM

## 2015-05-01 DIAGNOSIS — I639 Cerebral infarction, unspecified: Secondary | ICD-10-CM | POA: Diagnosis not present

## 2015-05-01 MED ORDER — METOPROLOL SUCCINATE ER 25 MG PO TB24
25.0000 mg | ORAL_TABLET | Freq: Every day | ORAL | Status: DC
Start: 2015-05-01 — End: 2015-06-19

## 2015-05-08 ENCOUNTER — Telehealth (INDEPENDENT_AMBULATORY_CARE_PROVIDER_SITE_OTHER): Payer: Self-pay

## 2015-05-08 ENCOUNTER — Encounter (INDEPENDENT_AMBULATORY_CARE_PROVIDER_SITE_OTHER): Payer: Self-pay

## 2015-05-08 NOTE — Telephone Encounter (Signed)
SPOKE TO PATIENT ABOUT SENDING A MANUAL TRANSMISSION, PATIENT STATED THAT SHE WILL GIVE ME A CALL THIS AFTERNOON SO WE CAN WALK THROUGH THE PROCESS/SROY

## 2015-05-09 ENCOUNTER — Encounter (INDEPENDENT_AMBULATORY_CARE_PROVIDER_SITE_OTHER): Payer: Self-pay

## 2015-05-09 NOTE — Progress Notes (Signed)
Results of Linq reviewed:     Intermittent A fib with RVR   Have her increase Her Toprol XL to 25 mg BID. Have her continue Coumadin  Spoke to patient about change in medication/SROY

## 2015-05-22 DIAGNOSIS — I639 Cerebral infarction, unspecified: Secondary | ICD-10-CM | POA: Diagnosis not present

## 2015-06-05 DIAGNOSIS — D225 Melanocytic nevi of trunk: Secondary | ICD-10-CM | POA: Diagnosis not present

## 2015-06-05 DIAGNOSIS — L820 Inflamed seborrheic keratosis: Secondary | ICD-10-CM | POA: Diagnosis not present

## 2015-06-06 DIAGNOSIS — Z124 Encounter for screening for malignant neoplasm of cervix: Secondary | ICD-10-CM | POA: Diagnosis not present

## 2015-06-06 DIAGNOSIS — Z01419 Encounter for gynecological examination (general) (routine) without abnormal findings: Secondary | ICD-10-CM | POA: Diagnosis not present

## 2015-06-19 ENCOUNTER — Other Ambulatory Visit (INDEPENDENT_AMBULATORY_CARE_PROVIDER_SITE_OTHER): Payer: Self-pay | Admitting: Cardiovascular Disease

## 2015-06-19 DIAGNOSIS — I159 Secondary hypertension, unspecified: Secondary | ICD-10-CM

## 2015-06-19 DIAGNOSIS — I639 Cerebral infarction, unspecified: Secondary | ICD-10-CM | POA: Diagnosis not present

## 2015-06-29 ENCOUNTER — Encounter (INDEPENDENT_AMBULATORY_CARE_PROVIDER_SITE_OTHER): Payer: Self-pay | Admitting: Cardiovascular Disease

## 2015-06-29 ENCOUNTER — Ambulatory Visit (INDEPENDENT_AMBULATORY_CARE_PROVIDER_SITE_OTHER): Payer: Medicare Other | Admitting: Cardiovascular Disease

## 2015-06-29 VITALS — BP 120/77 | HR 73 | Resp 16 | Ht 66.0 in | Wt 134.0 lb

## 2015-06-29 DIAGNOSIS — E7800 Pure hypercholesterolemia, unspecified: Secondary | ICD-10-CM

## 2015-06-29 DIAGNOSIS — I471 Supraventricular tachycardia: Secondary | ICD-10-CM

## 2015-06-29 DIAGNOSIS — I480 Paroxysmal atrial fibrillation: Secondary | ICD-10-CM | POA: Insufficient documentation

## 2015-06-29 NOTE — Progress Notes (Signed)
[facility] Cardiology office visit      I had the pleasure of seeing [name] [name] today for cardiovascular follow up. She is a pleasant 74 y.o. with recent CVA , PSVT , HPL , PAF     PAF  was noted on linq monitor.   She  denies any  chest pain,  angina on exertion, dyspnea on exertion, palpitations, lower extremity edema, orthopnea, paroxysmal nocturnal dyspnea, or syncope .      MEDICATIONS: She has a current medication list which includes the following prescription(s): atorvastatin - Take 20 mg by mouth daily, vitamin d - Take 1,000 Units by mouth daily, metoprolol xl - Take 1 tablet (25 mg total) by mouth 2 (two) times daily, raloxifene - Take 60 mg by mouth daily, travatan z, and warfarin - Take 5 mg by mouth daily.    REVIEW OF SYSTEMS: All other systems reviewed and negative except as above.    PHYSICAL EXAMINATION  General Appearance: A well-appearing woman in no acute distress.    Vital Signs: BP 120/77 mmHg  Pulse 73  Resp 16  Ht 1.676 m (5\' 6" )  Wt 60.782 kg (134 lb)  BMI 21.64 kg/m2  LMP 05/19/1992      HEENT: Sclera anicteric, conjunctiva without pallor, moist mucous membranes, normal dentition.   Neck: Supple without jugular venous distention. Thyroid nonpalpable. No carotid bruits.  Chest: Good air movement and respiratory effort.  Clear to auscultation bilaterally. No wheezes, rales, or rhonchi  Cardiovascular: Normal S1 and S2 without murmurs, gallops or rub. PMI nondisplaced.   Abdomen: Soft, nontender.  No bruits.    Extremities: Warm .  No  edema.  DP 2+ b/l   Skin: No rash,  Neuro: Alert. Grossly intact.  Psych: Normal mood and affect.     ECG:  normal sinus rhythm at  73 bpm, normal axis and intervals, no significant ST/T changes    ECHO: 11/2014  1. Normal biventricular size and systolic function with an estimated EF  of 60-65%.  2. Grade 1 diastolic dysfunction  3. No significant valvular disease  4. No obvious intracardiac masses, thrombi or vegetations were  appreciated  but cannot be completely excluded.    TEE 11/2014  1. Normal biventricular size and function. Estimated left ventricular  ejection fraction is 60-65%  2. No significant valvular disease.  3. No left atrial and Left atrial appendage thrombus. No atheroma seen  in the ascending aorta, arch and proximal portion of descending aorta.  No obvious cardiac source of emboli      ASSESSMENT AND PLAN:  1. Acute CVA.  Embolic  Continue Coumadin.  Patient will like to have her loop recorder explanted.  Will schedule  2. HTN:  Stable  3. PAF : asymptomatic , Monitor on BB  4. HPL: on Statins. Check lipids      All questions regarding cardiovascular diseases were answered during this encounter.    Orders Placed This Encounter   Procedures   . ECG 12 lead (Today)     Return in about 6 months (around 12/27/2015).        Bari Edward, MD

## 2015-06-30 ENCOUNTER — Encounter (INDEPENDENT_AMBULATORY_CARE_PROVIDER_SITE_OTHER): Payer: Self-pay

## 2015-07-04 DIAGNOSIS — H2513 Age-related nuclear cataract, bilateral: Secondary | ICD-10-CM | POA: Diagnosis not present

## 2015-07-04 DIAGNOSIS — H21541 Posterior synechiae (iris), right eye: Secondary | ICD-10-CM | POA: Diagnosis not present

## 2015-07-04 DIAGNOSIS — H25013 Cortical age-related cataract, bilateral: Secondary | ICD-10-CM | POA: Diagnosis not present

## 2015-07-04 DIAGNOSIS — H1045 Other chronic allergic conjunctivitis: Secondary | ICD-10-CM | POA: Diagnosis not present

## 2015-07-04 DIAGNOSIS — H3530 Unspecified macular degeneration: Secondary | ICD-10-CM | POA: Diagnosis not present

## 2015-07-05 ENCOUNTER — Other Ambulatory Visit: Payer: Self-pay | Admitting: Family

## 2015-07-05 DIAGNOSIS — M810 Age-related osteoporosis without current pathological fracture: Secondary | ICD-10-CM

## 2015-07-06 ENCOUNTER — Ambulatory Visit (INDEPENDENT_AMBULATORY_CARE_PROVIDER_SITE_OTHER): Payer: Medicare Other | Admitting: Cardiovascular Disease

## 2015-07-06 ENCOUNTER — Ambulatory Visit
Admission: RE | Admit: 2015-07-06 | Discharge: 2015-07-06 | Disposition: A | Discharge status: Home / Self Care | Payer: Medicare Other | Source: Ambulatory Visit | Attending: Family | Admitting: Family

## 2015-07-06 DIAGNOSIS — I4891 Unspecified atrial fibrillation: Secondary | ICD-10-CM

## 2015-07-06 DIAGNOSIS — M810 Age-related osteoporosis without current pathological fracture: Secondary | ICD-10-CM

## 2015-07-06 DIAGNOSIS — Z4509 Encounter for adjustment and management of other cardiac device: Secondary | ICD-10-CM

## 2015-07-06 DIAGNOSIS — M8589 Other specified disorders of bone density and structure, multiple sites: Secondary | ICD-10-CM | POA: Diagnosis not present

## 2015-07-06 NOTE — Progress Notes (Signed)
[facility] Cardiology [facility]  Implantable Recorder Follow Up    Date:  07/06/2015     Date of monitoring period:  From 06/05/2015 to 07/04/2015    Device:  Medtronic Linq    Procedure:  Remote data was acquired with receipt of transmission and technician review and result was distributed to cardiologist.  Heart rhythm data was reviewed and analyzed by the physician.    Physician comments:   No A fib since last interrogation    Reviewed and electronically signed by  Bari Edward, MD

## 2015-07-07 ENCOUNTER — Ambulatory Visit: Admit: 2015-07-07 | Payer: Self-pay | Admitting: Cardiovascular Disease

## 2015-07-07 ENCOUNTER — Encounter (INDEPENDENT_AMBULATORY_CARE_PROVIDER_SITE_OTHER): Payer: Self-pay

## 2015-07-07 SURGERY — LOOP RECORDER EXPLANT
Anesthesia: Local

## 2015-07-11 ENCOUNTER — Encounter
Admission: RE | Disposition: A | Discharge status: Home / Self Care | Payer: Self-pay | Source: Ambulatory Visit | Attending: Cardiovascular Disease

## 2015-07-11 ENCOUNTER — Ambulatory Visit
Admission: RE | Admit: 2015-07-11 | Discharge: 2015-07-11 | Disposition: A | Discharge status: Home / Self Care | Payer: Medicare Other | Source: Ambulatory Visit | Attending: Cardiovascular Disease | Admitting: Cardiovascular Disease

## 2015-07-11 ENCOUNTER — Ambulatory Visit: Payer: Medicare Other | Admitting: Cardiovascular Disease

## 2015-07-11 DIAGNOSIS — I4891 Unspecified atrial fibrillation: Secondary | ICD-10-CM

## 2015-07-11 DIAGNOSIS — Z4509 Encounter for adjustment and management of other cardiac device: Secondary | ICD-10-CM | POA: Insufficient documentation

## 2015-07-11 DIAGNOSIS — I480 Paroxysmal atrial fibrillation: Secondary | ICD-10-CM | POA: Insufficient documentation

## 2015-07-11 DIAGNOSIS — Z7901 Long term (current) use of anticoagulants: Secondary | ICD-10-CM | POA: Insufficient documentation

## 2015-07-11 SURGERY — LOOP RECORDER EXPLANT
Anesthesia: Local

## 2015-07-11 MED ORDER — LIDOCAINE-EPINEPHRINE 2 %-1:200000 IJ SOLN
INTRAMUSCULAR | Status: DC
Start: 2015-07-11 — End: 2015-07-11
  Filled 2015-07-11: qty 20

## 2015-07-11 NOTE — Procedures (Signed)
A pre-procedure timeout was performed with the patient and clinical staff. The left chest was prepped and draped in sterile fashion using Chloraprep and sterile towels. The incision site was anesthetized with 10mL of lidocaine with epinephrine. A small 1cm incision was made using a scalpel and the loop recorder was successfully explanted after blunt dissection and lysis of adhesions with a hemostat and scissors. Hemostasis was achieved with pressure and the site was sealed with dermabond. Estimated blood loss <40mL.

## 2015-07-11 NOTE — OR Nursing (Signed)
Patient tolerated procedure very well.  Dr. Willaim Bane instructed patient to resume coumadin when she gets home today as she usually takes it in the morning.  Patient was in NSR for the entire procedure.  Dermabond to site.  Discharge instructions given to patient and she verbalized understanding.  Discharged to home in good condition ambulatory.

## 2015-07-11 NOTE — Discharge Instructions (Signed)
Procedure done today:  Loop recorder explant    Dressing removal:   Skin glue will flake off by itself, do not pick at it.    May take shower:  Do not allow water to fall directly on site, once glue is completely disintegrated and incision appears healed, you may shower.    Resume all previous medications.  May resume your coumadin at your normally scheduled time.    Resume your usual diet.    Activity:  As tolerated.    Follow up:  As per your cardiologist recommendation.    Monitor for any signs of infection or bleeding   - fever greater 100.3, chills   - uncontrolled bleeding   - moderate foul smelling drainage    Any question please call Interventional Radiology at [phone]  Monday - Friday 8:30-4:30p              Patient/Responsible adult signature _________________________________________DATE: __________________________ TIME ___________________________          RN Signature __________________________________________________________ Date: _________________________________ Time ____________________________

## 2015-07-11 NOTE — OR Nursing (Signed)
Patient received to IR room 5 for minor procedure, loop recorder explantation with local anesthesia only.  Patient is alert and oriented x4, denies any complaints of pain.  Dr. Willaim Bane paged.

## 2015-07-18 DIAGNOSIS — I639 Cerebral infarction, unspecified: Secondary | ICD-10-CM | POA: Diagnosis not present

## 2015-08-08 DIAGNOSIS — Z23 Encounter for immunization: Secondary | ICD-10-CM | POA: Diagnosis not present

## 2015-08-08 DIAGNOSIS — M810 Age-related osteoporosis without current pathological fracture: Secondary | ICD-10-CM | POA: Insufficient documentation

## 2015-08-08 DIAGNOSIS — Z7901 Long term (current) use of anticoagulants: Secondary | ICD-10-CM | POA: Insufficient documentation

## 2015-08-08 DIAGNOSIS — Z853 Personal history of malignant neoplasm of breast: Secondary | ICD-10-CM | POA: Insufficient documentation

## 2015-08-08 DIAGNOSIS — E7800 Pure hypercholesterolemia, unspecified: Secondary | ICD-10-CM | POA: Insufficient documentation

## 2015-08-08 DIAGNOSIS — Z1389 Encounter for screening for other disorder: Secondary | ICD-10-CM | POA: Diagnosis not present

## 2015-08-08 DIAGNOSIS — H409 Unspecified glaucoma: Secondary | ICD-10-CM | POA: Insufficient documentation

## 2015-08-08 DIAGNOSIS — I480 Paroxysmal atrial fibrillation: Secondary | ICD-10-CM | POA: Insufficient documentation

## 2015-08-08 DIAGNOSIS — I10 Essential (primary) hypertension: Secondary | ICD-10-CM | POA: Insufficient documentation

## 2015-08-08 DIAGNOSIS — I679 Cerebrovascular disease, unspecified: Secondary | ICD-10-CM | POA: Insufficient documentation

## 2015-08-08 DIAGNOSIS — Z6821 Body mass index (BMI) 21.0-21.9, adult: Secondary | ICD-10-CM | POA: Diagnosis not present

## 2015-08-28 DIAGNOSIS — I4891 Unspecified atrial fibrillation: Secondary | ICD-10-CM | POA: Insufficient documentation

## 2015-08-29 ENCOUNTER — Ambulatory Visit (INDEPENDENT_AMBULATORY_CARE_PROVIDER_SITE_OTHER): Payer: Medicare Other | Admitting: Cardiology

## 2015-08-29 ENCOUNTER — Encounter: Payer: Self-pay | Admitting: Cardiology

## 2015-08-29 VITALS — BP 124/70 | HR 80 | Ht 66.0 in | Wt 138.0 lb

## 2015-08-29 DIAGNOSIS — E785 Hyperlipidemia, unspecified: Secondary | ICD-10-CM | POA: Insufficient documentation

## 2015-08-29 DIAGNOSIS — I63012 Cerebral infarction due to thrombosis of left vertebral artery: Secondary | ICD-10-CM

## 2015-08-29 DIAGNOSIS — I10 Essential (primary) hypertension: Secondary | ICD-10-CM | POA: Diagnosis not present

## 2015-08-29 DIAGNOSIS — I480 Paroxysmal atrial fibrillation: Secondary | ICD-10-CM | POA: Diagnosis not present

## 2015-08-29 MED ORDER — APIXABAN 5 MG PO TABS: 5.0000 mg | ORAL_TABLET | Freq: Two times a day (BID) | ORAL | Status: DC

## 2015-08-29 NOTE — Patient Instructions (Addendum)
Medication Instructions:   STOP COUMADIN NOW  START TAKING ELIQUIS 5 MG BY MOUTH TWICE DAILY STARTING ON THIS Saturday 09/02/15 PER DR [name]   Your physician recommends that you return for lab work in: ON 09/05/15 AT OUR OFFICE TO CHECK A CMET, LIPIDS, TSH, AND CBC W DIFF--PLEASE MAKE SURE TO CALL us IF YOU FIND YOUR RECENT LIPID RESULTS FROM YOUR PCP, SO WE CAN CANCEL OUR LIPID ORDER.    Follow-Up:  3 MONTHS WITH DR [name]    If you need a refill on your cardiac medications before your next appointment, please call your pharmacy.

## 2015-08-29 NOTE — Progress Notes (Signed)
Patient ID: Angela Carpenter, female   DOB: [birth year], 74 y.o.   MRN: [id number]      Cardiology Office Note  Date:  08/29/2015   ID:  Shaylea Dey, DOB 1940-12-17, MRN [id number]  PCP:  Haywood Pao, MD  Cardiologist:   [name], MD   Chief complain: To establish cardiology care   History of Present Illness: Angela Carpenter is a 74 y.o. female with h/o hypertension, hyperlipidemia, s/p CVA in 11/2014, s/p Linq implantation and removal, diagnosis of paroxysmal atrial fibrillation and initiation of warfarin. She is very active walking 2 miles /day without any symptoms. No bleeding with warfarin. No palpitations or syncope.  No CP or DOE.  Her echocardiogram showed LVEF 60-65%, normal RVEF, normal LA size, mild MR, otherwise normal echo. On ECG in 06/2015 she was in [place]. She is tolerating atorvastatin well.    Past Medical History  Diagnosis Date  . A-fib [facility])     Past Surgical History  Procedure Laterality Date  . Pacemaker insertion       Current Outpatient Prescriptions  Medication Sig Dispense Refill  . atorvastatin (LIPITOR) 20 MG tablet Take 20 mg by mouth daily.    . Cholecalciferol (VITAMIN D-3) 1000 UNITS CAPS Take 1 capsule by mouth daily.    . metoprolol tartrate (LOPRESSOR) 25 MG tablet Take 25 mg by mouth 2 (two) times daily.    . Multiple Vitamins-Minerals (ICAPS AREDS 2 PO) Take 1 capsule by mouth 2 (two) times daily.    . raloxifene (EVISTA) 60 MG tablet Take 60 mg by mouth daily.    . Travoprost, BAK Free, (TRAVATAN) 0.004 % SOLN ophthalmic solution Place 1 drop into the left eye daily.    Marland Kitchen warfarin (COUMADIN) 5 MG tablet Take 5 mg by mouth daily.     No current facility-administered medications for this visit.    Allergies:   Review of patient's allergies indicates no known allergies.    Social History:  The patient  reports that she has been passively smoking.  She has never used smokeless tobacco. She reports that she does not drink alcohol or use  illicit drugs.   Family History:  The patient's family history includes Diabetes in her sister; Hypotension in her mother; Kidney cancer in an other family member; Lung cancer in her mother; Pancreatic cancer in an other family member.   ROS:  Please see the history of present illness.   Otherwise, review of systems are positive for none.   All other systems are reviewed and negative.   PHYSICAL EXAM: VS:  BP 124/70 mmHg  Pulse 80  Ht 5\' 6"  (1.676 m)  Wt 138 lb (62.596 kg)  BMI 22.28 kg/m2  SpO2 99% , BMI Body mass index is 22.28 kg/(m^2). GEN: Well nourished, well developed, in no acute distress HEENT: normal Neck: no JVD, carotid bruits, or masses Cardiac: RRR; no murmurs, rubs, or gallops,no edema  Respiratory:  clear to auscultation bilaterally, normal work of breathing GI: soft, nontender, nondistended, + BS MS: no deformity or atrophy Skin: warm and dry, no rash Neuro:  Strength and sensation are intact Psych: euthymic mood, full affect  Recent Labs: No results found for requested labs within last 365 days.   Lipid Panel No results found for: CHOL, TRIG, HDL, CHOLHDL, VLDL, LDLCALC, LDLDIRECT   Wt Readings from Last 3 Encounters:  08/29/15 138 lb (62.596 kg)      ASSESSMENT AND PLAN:  1. Paroxysmal atrial fibrillation - on chronic anticoagulation,  this is non-valvular a-fib and the patient would prefer to be on NOAC, we will switch to Eliquis, she is instructed to not to take coumadin for 3 days before starting Eliquis.  2. Hypertension - controlled on metoprolol  3. Hyperlipidemia - on atorvastatin 20 mg po daily, unknown labs.  Follow up in 3 months with CMP, CBC, TSH and lipids.   Signed, [name], MD  08/29/2015 4:36 PM    [facility] [address], [place], [place]  [address] Phone: [phone]; Fax: [phone]

## 2015-08-29 NOTE — Addendum Note (Signed)
Addended by: Nuala Alpha on: 08/29/2015 07:13 PM   Modules accepted: Orders

## 2015-09-05 ENCOUNTER — Other Ambulatory Visit (INDEPENDENT_AMBULATORY_CARE_PROVIDER_SITE_OTHER): Payer: Medicare Other

## 2015-09-05 DIAGNOSIS — I10 Essential (primary) hypertension: Secondary | ICD-10-CM

## 2015-09-05 DIAGNOSIS — I480 Paroxysmal atrial fibrillation: Secondary | ICD-10-CM | POA: Diagnosis not present

## 2015-09-05 DIAGNOSIS — I63012 Cerebral infarction due to thrombosis of left vertebral artery: Secondary | ICD-10-CM | POA: Diagnosis not present

## 2015-09-05 DIAGNOSIS — E785 Hyperlipidemia, unspecified: Secondary | ICD-10-CM

## 2015-09-05 DIAGNOSIS — I4891 Unspecified atrial fibrillation: Secondary | ICD-10-CM | POA: Diagnosis not present

## 2015-09-05 LAB — COMPREHENSIVE METABOLIC PANEL
ALT: 39 U/L — ABNORMAL HIGH (ref 6–29)
AST: 27 U/L (ref 10–35)
Albumin: 4.1 g/dL (ref 3.6–5.1)
Alkaline Phosphatase: 67 U/L (ref 33–130)
BUN: 18 mg/dL (ref 7–25)
CO2: 30 mmol/L (ref 20–31)
Calcium: 9.2 mg/dL (ref 8.6–10.4)
Chloride: 99 mmol/L (ref 98–110)
Creat: 0.7 mg/dL (ref 0.60–0.93)
Glucose, Bld: 101 mg/dL — ABNORMAL HIGH (ref 65–99)
Potassium: 4.7 mmol/L (ref 3.5–5.3)
Sodium: 136 mmol/L (ref 135–146)
Total Bilirubin: 0.6 mg/dL (ref 0.2–1.2)
Total Protein: 6.4 g/dL (ref 6.1–8.1)

## 2015-09-05 LAB — CBC WITH DIFFERENTIAL/PLATELET
Basophils Absolute: 0.1 10*3/uL (ref 0.0–0.1)
Basophils Relative: 1 % (ref 0–1)
Eosinophils Absolute: 0.4 10*3/uL (ref 0.0–0.7)
Eosinophils Relative: 6 % — ABNORMAL HIGH (ref 0–5)
HCT: 41.6 % (ref 36.0–46.0)
Hemoglobin: 13.6 g/dL (ref 12.0–15.0)
Lymphocytes Relative: 23 % (ref 12–46)
Lymphs Abs: 1.6 10*3/uL (ref 0.7–4.0)
MCH: 30.3 pg (ref 26.0–34.0)
MCHC: 32.7 g/dL (ref 30.0–36.0)
MCV: 92.7 fL (ref 78.0–100.0)
MPV: 9.3 fL (ref 8.6–12.4)
Monocytes Absolute: 0.6 10*3/uL (ref 0.1–1.0)
Monocytes Relative: 9 % (ref 3–12)
Neutro Abs: 4.3 10*3/uL (ref 1.7–7.7)
Neutrophils Relative %: 61 % (ref 43–77)
Platelets: 302 10*3/uL (ref 150–400)
RBC: 4.49 MIL/uL (ref 3.87–5.11)
RDW: 13.8 % (ref 11.5–15.5)
WBC: 7 10*3/uL (ref 4.0–10.5)

## 2015-09-05 LAB — LIPID PANEL
Cholesterol: 155 mg/dL (ref 125–200)
HDL: 84 mg/dL (ref >=46)
LDL Cholesterol: 53 mg/dL (ref <130)
Total CHOL/HDL Ratio: 1.8 Ratio (ref <=5.0)
Triglycerides: 90 mg/dL (ref <150)
VLDL: 18 mg/dL (ref <30)

## 2015-09-05 LAB — TSH: TSH: 3.215 u[IU]/mL (ref 0.350–4.500)

## 2015-09-07 DIAGNOSIS — H401122 Primary open-angle glaucoma, left eye, moderate stage: Secondary | ICD-10-CM | POA: Diagnosis not present

## 2015-09-07 DIAGNOSIS — H401111 Primary open-angle glaucoma, right eye, mild stage: Secondary | ICD-10-CM | POA: Diagnosis not present

## 2015-09-07 DIAGNOSIS — H25012 Cortical age-related cataract, left eye: Secondary | ICD-10-CM | POA: Diagnosis not present

## 2015-09-07 DIAGNOSIS — Z961 Presence of intraocular lens: Secondary | ICD-10-CM | POA: Diagnosis not present

## 2015-09-11 ENCOUNTER — Other Ambulatory Visit (INDEPENDENT_AMBULATORY_CARE_PROVIDER_SITE_OTHER): Payer: Self-pay | Admitting: Cardiovascular Disease

## 2015-09-11 DIAGNOSIS — I159 Secondary hypertension, unspecified: Secondary | ICD-10-CM

## 2015-09-11 MED ORDER — METOPROLOL SUCCINATE ER 25 MG PO TB24
25.0000 mg | ORAL_TABLET | Freq: Two times a day (BID) | ORAL | Status: AC
Start: 2015-09-11 — End: ?

## 2015-09-25 ENCOUNTER — Encounter: Payer: Self-pay | Admitting: Cardiology

## 2015-12-04 ENCOUNTER — Encounter: Payer: Self-pay | Admitting: Cardiology

## 2015-12-04 ENCOUNTER — Ambulatory Visit (INDEPENDENT_AMBULATORY_CARE_PROVIDER_SITE_OTHER): Payer: Medicare Other | Admitting: Cardiology

## 2015-12-04 VITALS — BP 118/62 | HR 68 | Ht 66.0 in | Wt 144.0 lb

## 2015-12-04 DIAGNOSIS — E785 Hyperlipidemia, unspecified: Secondary | ICD-10-CM | POA: Diagnosis not present

## 2015-12-04 DIAGNOSIS — I10 Essential (primary) hypertension: Secondary | ICD-10-CM | POA: Diagnosis not present

## 2015-12-04 DIAGNOSIS — I480 Paroxysmal atrial fibrillation: Secondary | ICD-10-CM

## 2015-12-04 MED ORDER — ATORVASTATIN CALCIUM 10 MG PO TABS: 10.0000 mg | ORAL_TABLET | Freq: Every day | ORAL | Status: DC

## 2015-12-04 NOTE — Patient Instructions (Signed)
**Note De-Identified Janelli Welling Obfuscation** Medication Instructions:  Decrease Atorvastatin to 10 mg daily=no other changes  Labwork: None  Testing/Procedures: None  Follow-Up: Your physician wants you to follow-up in: 1 year. You will receive a reminder letter in the mail two months in advance. If you don't receive a letter, please call our office to schedule the follow-up appointment.   Any Other Special Instructions Will Be Listed Below (If Applicable).     If you need a refill on your cardiac medications before your next appointment, please call your pharmacy.

## 2015-12-04 NOTE — Progress Notes (Signed)
Patient ID: Angela Carpenter, female   DOB: 1941-01-30, 75 y.o.   MRN: [id number]      Cardiology Office Note  Date:  12/04/2015   ID:  Angela Carpenter, DOB [birth year], MRN [id number]  PCP:  Haywood Pao, MD  Cardiologist:   [name], MD   Chief complain: To establish cardiology care   History of Present Illness: Angela Carpenter is a 75 y.o. female with h/o hypertension, hyperlipidemia, s/p CVA in 11/2014, s/p Linq implantation and removal, diagnosis of paroxysmal atrial fibrillation and initiation of warfarin. She is very active walking 2 miles /day without any symptoms. No bleeding with warfarin. No palpitations or syncope.  No CP or DOE.  Her echocardiogram showed LVEF 60-65%, normal RVEF, normal LA size, mild MR, otherwise normal echo. On ECG in 06/2015 she was in [place]. She is tolerating atorvastatin well.   12/04/2015 - 3 months follow-up minimal palpitations lasting less than minutes, no dizziness, no syncope. No chest pain or dyspnea on exertion or lower extremity edema she is compliant to her meds and denies any bleeding with Eliquis.   Past Medical History  Diagnosis Date  . A-fib [facility])     Past Surgical History  Procedure Laterality Date  . Pacemaker insertion       Current Outpatient Prescriptions  Medication Sig Dispense Refill  . apixaban (ELIQUIS) 5 MG TABS tablet Take 1 tablet (5 mg total) by mouth 2 (two) times daily. Start this med on Saturday 09/02/15 per Dr. [name] 180 tablet 3  . atorvastatin (LIPITOR) 20 MG tablet Take 20 mg by mouth daily.    . Cholecalciferol (VITAMIN D-3) 1000 UNITS CAPS Take 1 capsule by mouth daily.    . Multiple Vitamins-Minerals (ICAPS AREDS 2 PO) Take 1 capsule by mouth 2 (two) times daily.    . raloxifene (EVISTA) 60 MG tablet Take 60 mg by mouth daily.    . TOPROL XL 25 MG 24 hr tablet Take 1 tablet by mouth 2 (two) times daily.    . Travoprost, BAK Free, (TRAVATAN) 0.004 % SOLN ophthalmic solution Place 1 drop into the left eye  daily.     No current facility-administered medications for this visit.    Allergies:   Review of patient's allergies indicates no known allergies.    Social History:  The patient  reports that she has been passively smoking.  She has never used smokeless tobacco. She reports that she does not drink alcohol or use illicit drugs.   Family History:  The patient's family history includes Diabetes in her sister; Hypotension in her mother; Lung cancer in her mother.   ROS:  Please see the history of present illness.   Otherwise, review of systems are positive for none.   All other systems are reviewed and negative.   PHYSICAL EXAM: VS:  BP 118/62 mmHg  Pulse 68  Ht 5\' 6"  (1.676 m)  Wt 144 lb (65.318 kg)  BMI 23.25 kg/m2 , BMI Body mass index is 23.25 kg/(m^2). GEN: Well nourished, well developed, in no acute distress HEENT: normal Neck: no JVD, carotid bruits, or masses Cardiac: RRR; no murmurs, rubs, or gallops,no edema  Respiratory:  clear to auscultation bilaterally, normal work of breathing GI: soft, nontender, nondistended, + BS MS: no deformity or atrophy Skin: warm and dry, no rash Neuro:  Strength and sensation are intact Psych: euthymic mood, full affect  Recent Labs: 09/05/2015: ALT 39*; BUN 18; Creat 0.70; Hemoglobin 13.6; Platelets 302; Potassium 4.7; Sodium 136; TSH  3.215   Lipid Panel    Component Value Date/Time   CHOL 155 09/05/2015 0828   TRIG 90 09/05/2015 0828   HDL 84 09/05/2015 0828   CHOLHDL 1.8 09/05/2015 0828   VLDL 18 09/05/2015 0828   LDLCALC 53 09/05/2015 0828     Wt Readings from Last 3 Encounters:  12/04/15 144 lb (65.318 kg)  08/29/15 138 lb (62.596 kg)      ASSESSMENT AND PLAN:  1. Paroxysmal atrial fibrillation - on chronic anticoagulation, this is non-valvular a-fib, we switched her to Eliquis that she tolerates very well and has no bleeding. TSH normal.  2. Hypertension - controlled on metoprolol  3. Hyperlipidemia - on  atorvastatin 20 mg po daily, her labs in November 2016 showed excellent lipid control and minimally elevated LFTs, we will decrease atorvastatin to take milligrams daily.  Follow up in 1 year.   Signed, [name], MD  12/04/2015 9:52 AM    [address], [place], [place]  [address] Phone: [phone]; Fax: [phone]

## 2016-02-08 ENCOUNTER — Other Ambulatory Visit: Payer: Self-pay | Admitting: Internal Medicine

## 2016-02-08 DIAGNOSIS — Z853 Personal history of malignant neoplasm of breast: Secondary | ICD-10-CM | POA: Diagnosis not present

## 2016-02-08 DIAGNOSIS — Z6822 Body mass index (BMI) 22.0-22.9, adult: Secondary | ICD-10-CM | POA: Diagnosis not present

## 2016-02-08 DIAGNOSIS — M810 Age-related osteoporosis without current pathological fracture: Secondary | ICD-10-CM | POA: Diagnosis not present

## 2016-02-08 DIAGNOSIS — Z7901 Long term (current) use of anticoagulants: Secondary | ICD-10-CM | POA: Diagnosis not present

## 2016-02-08 DIAGNOSIS — I10 Essential (primary) hypertension: Secondary | ICD-10-CM | POA: Diagnosis not present

## 2016-02-08 DIAGNOSIS — C4360 Malignant melanoma of unspecified upper limb, including shoulder: Secondary | ICD-10-CM | POA: Insufficient documentation

## 2016-02-08 DIAGNOSIS — C4491 Basal cell carcinoma of skin, unspecified: Secondary | ICD-10-CM | POA: Insufficient documentation

## 2016-02-08 DIAGNOSIS — H409 Unspecified glaucoma: Secondary | ICD-10-CM | POA: Diagnosis not present

## 2016-02-08 DIAGNOSIS — I639 Cerebral infarction, unspecified: Secondary | ICD-10-CM | POA: Diagnosis not present

## 2016-02-08 DIAGNOSIS — C4362 Malignant melanoma of left upper limb, including shoulder: Secondary | ICD-10-CM | POA: Diagnosis not present

## 2016-02-08 DIAGNOSIS — D126 Benign neoplasm of colon, unspecified: Secondary | ICD-10-CM | POA: Insufficient documentation

## 2016-02-08 DIAGNOSIS — I480 Paroxysmal atrial fibrillation: Secondary | ICD-10-CM | POA: Diagnosis not present

## 2016-02-08 DIAGNOSIS — E7800 Pure hypercholesterolemia, unspecified: Secondary | ICD-10-CM | POA: Diagnosis not present

## 2016-02-13 ENCOUNTER — Other Ambulatory Visit: Payer: Self-pay | Admitting: Internal Medicine

## 2016-02-13 ENCOUNTER — Ambulatory Visit
Admission: RE | Admit: 2016-02-13 | Discharge: 2016-02-13 | Disposition: A | Discharge status: Home / Self Care | Payer: Medicare Other | Source: Ambulatory Visit | Attending: Internal Medicine | Admitting: Internal Medicine

## 2016-02-13 DIAGNOSIS — Z1231 Encounter for screening mammogram for malignant neoplasm of breast: Secondary | ICD-10-CM | POA: Diagnosis not present

## 2016-02-13 DIAGNOSIS — Z853 Personal history of malignant neoplasm of breast: Secondary | ICD-10-CM

## 2016-03-03 ENCOUNTER — Encounter (HOSPITAL_COMMUNITY): Payer: Self-pay | Admitting: Emergency Medicine

## 2016-03-03 ENCOUNTER — Emergency Department (HOSPITAL_COMMUNITY)
Admission: EM | Admit: 2016-03-03 | Discharge: 2016-03-03 | Disposition: A | Discharge status: Home / Self Care | Payer: Medicare Other | Attending: Emergency Medicine | Admitting: Emergency Medicine

## 2016-03-03 ENCOUNTER — Emergency Department (HOSPITAL_COMMUNITY): Payer: Medicare Other

## 2016-03-03 DIAGNOSIS — Z8673 Personal history of transient ischemic attack (TIA), and cerebral infarction without residual deficits: Secondary | ICD-10-CM | POA: Diagnosis not present

## 2016-03-03 DIAGNOSIS — Y998 Other external cause status: Secondary | ICD-10-CM | POA: Diagnosis not present

## 2016-03-03 DIAGNOSIS — Y9389 Activity, other specified: Secondary | ICD-10-CM | POA: Diagnosis not present

## 2016-03-03 DIAGNOSIS — Z853 Personal history of malignant neoplasm of breast: Secondary | ICD-10-CM | POA: Diagnosis not present

## 2016-03-03 DIAGNOSIS — Y92094 Garage of other non-institutional residence as the place of occurrence of the external cause: Secondary | ICD-10-CM | POA: Diagnosis not present

## 2016-03-03 DIAGNOSIS — R51 Headache: Secondary | ICD-10-CM | POA: Diagnosis not present

## 2016-03-03 DIAGNOSIS — W01198A Fall on same level from slipping, tripping and stumbling with subsequent striking against other object, initial encounter: Secondary | ICD-10-CM | POA: Diagnosis not present

## 2016-03-03 DIAGNOSIS — Z7901 Long term (current) use of anticoagulants: Secondary | ICD-10-CM | POA: Insufficient documentation

## 2016-03-03 DIAGNOSIS — W19XXXA Unspecified fall, initial encounter: Secondary | ICD-10-CM

## 2016-03-03 DIAGNOSIS — Z8679 Personal history of other diseases of the circulatory system: Secondary | ICD-10-CM | POA: Insufficient documentation

## 2016-03-03 DIAGNOSIS — Z79899 Other long term (current) drug therapy: Secondary | ICD-10-CM | POA: Insufficient documentation

## 2016-03-03 DIAGNOSIS — S0990XA Unspecified injury of head, initial encounter: Secondary | ICD-10-CM | POA: Diagnosis not present

## 2016-03-03 HISTORY — DX: Cerebral infarction, unspecified: I63.9

## 2016-03-03 HISTORY — DX: Malignant neoplasm of unspecified site of unspecified female breast: C50.919

## 2016-03-03 IMAGING — CT CT HEAD W/O CM
3 series · 15 of 47 positions shown, 18 images · non-contrast
Comparison: No priors.

CLINICAL DATA: 74-year-old female with history of trauma after
falling down 2 steps in her garage with injury to the back of the
head on concrete. Evaluate for intracranial hemorrhage. Patient is
on blood thinners.

EXAM:
CT HEAD WITHOUT CONTRAST
TECHNIQUE: Contiguous axial images were obtained from the base of the skull
through the vertex without intravenous contrast.

[Series 2: head 5.0 h30s · axial · 0.43mm/px · z∈[+1434,+1569]mm · 9 of 33 slices shown, 12 images]
[im 3/33  brain]
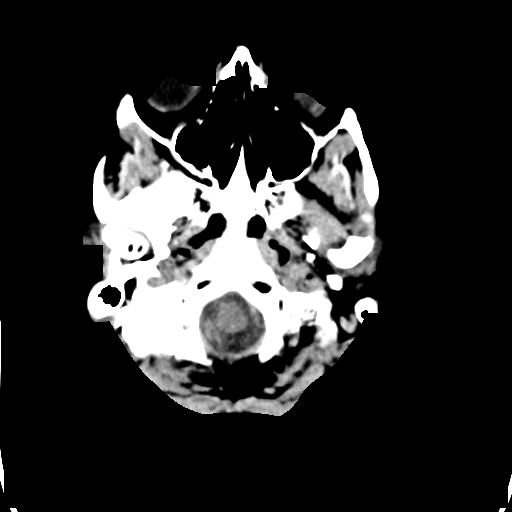
[im 3/33  bone]
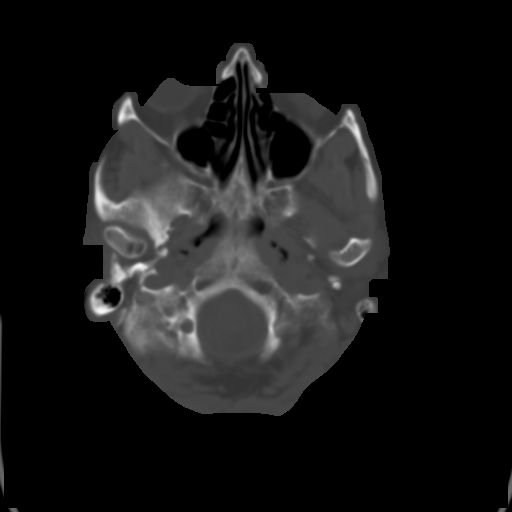
[im 6/33  brain]
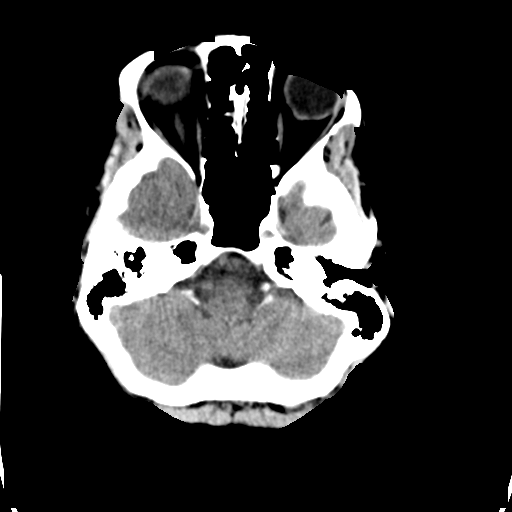
[im 9/33  brain]
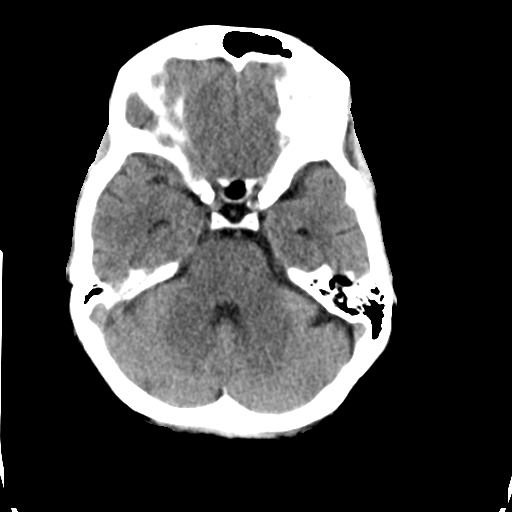
[im 13/33  brain]
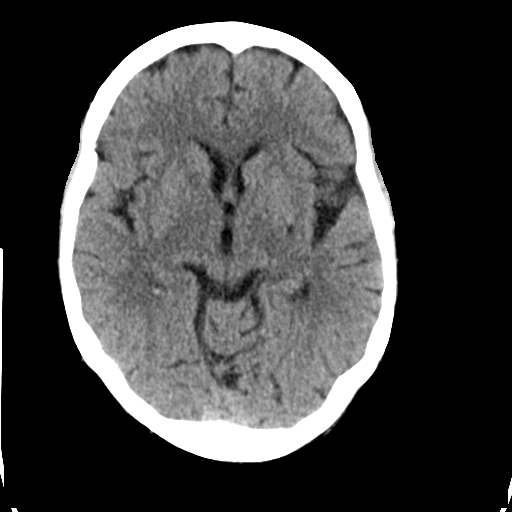
[im 17/33  brain]
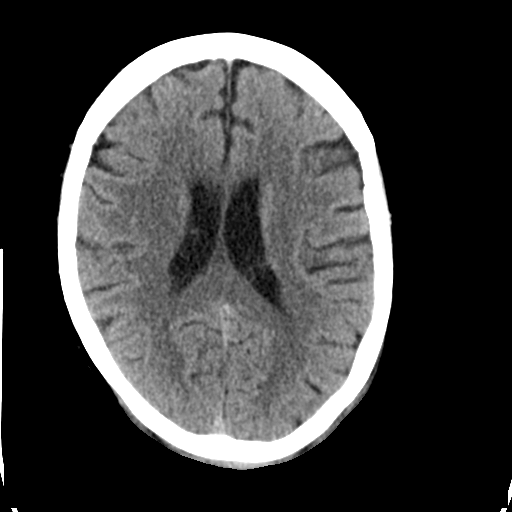
[im 17/33  bone]
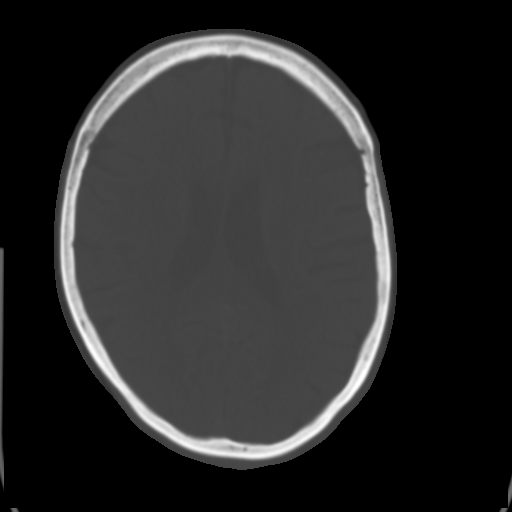
[im 20/33  brain]
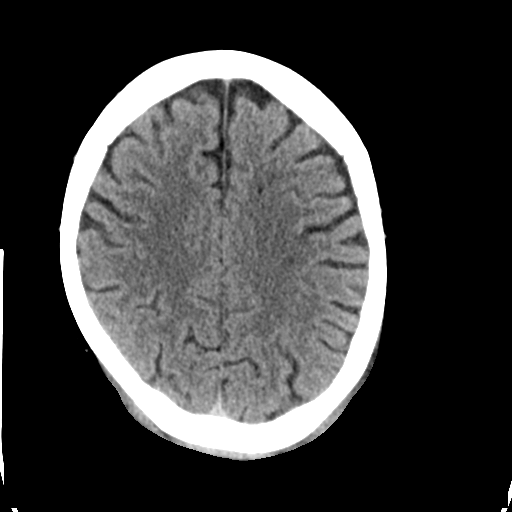
[im 24/33  brain]
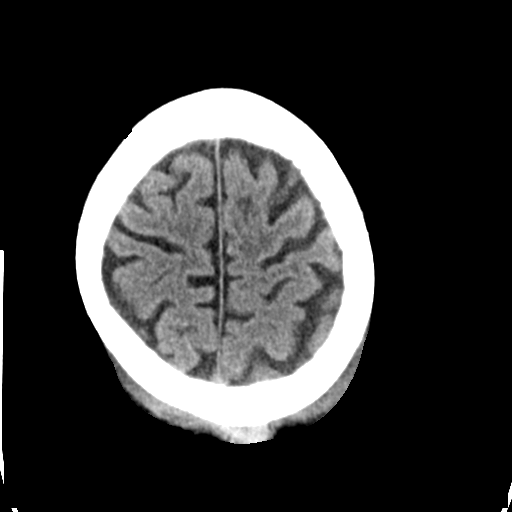
[im 27/33  brain]
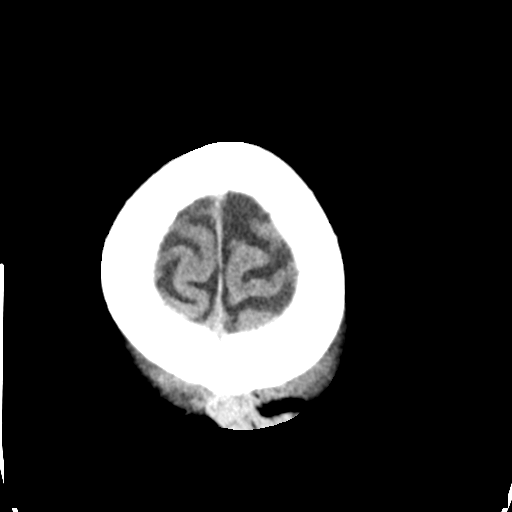
[im 30/33  brain]
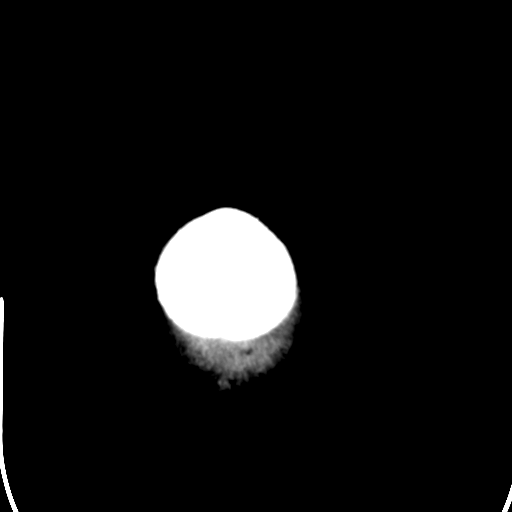
[im 30/33  bone]
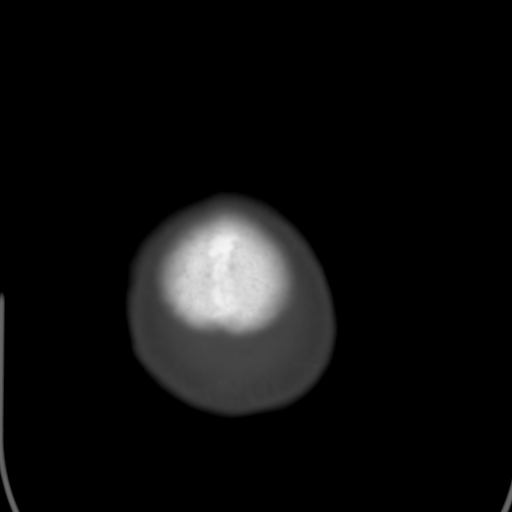

[Series 4: head 3.0 mpr · coronal · 0.34mm/px · 3 of 68 slices shown (1 of 2)]
[im 23/68  brain]
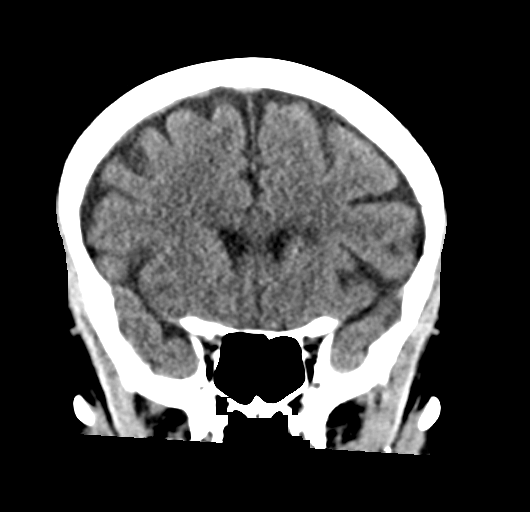
[im 30/68  brain]
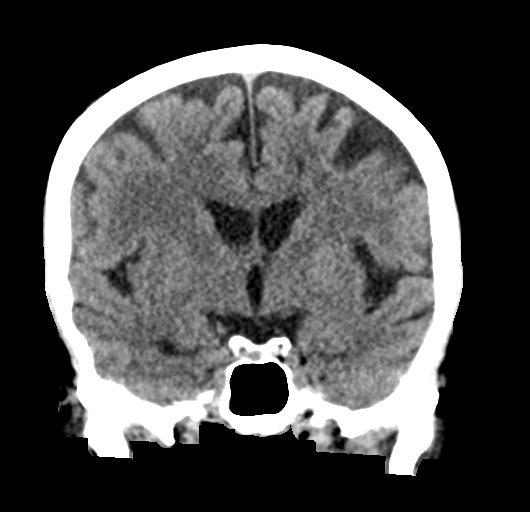
[im 38/68  brain]
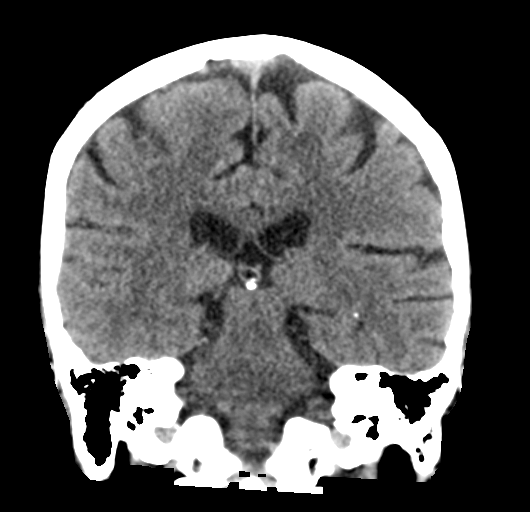

[Series 5: head 3.0 mpr · sagittal · 0.33mm/px · 3 of 59 slices shown (2 of 2)]
[im 20/59  brain]
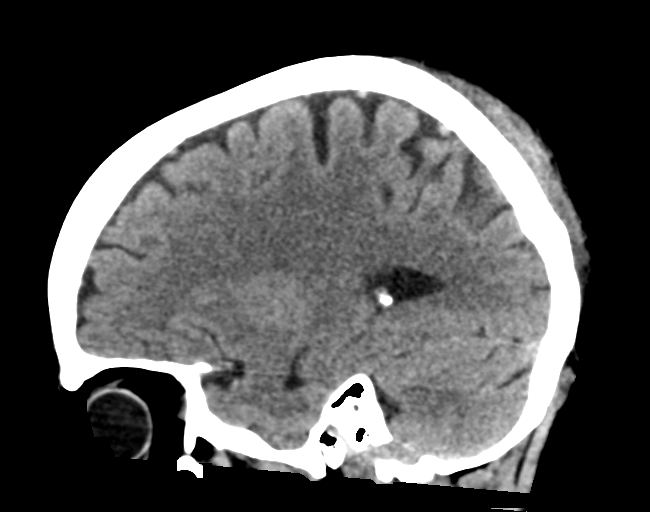
[im 30/59  brain]
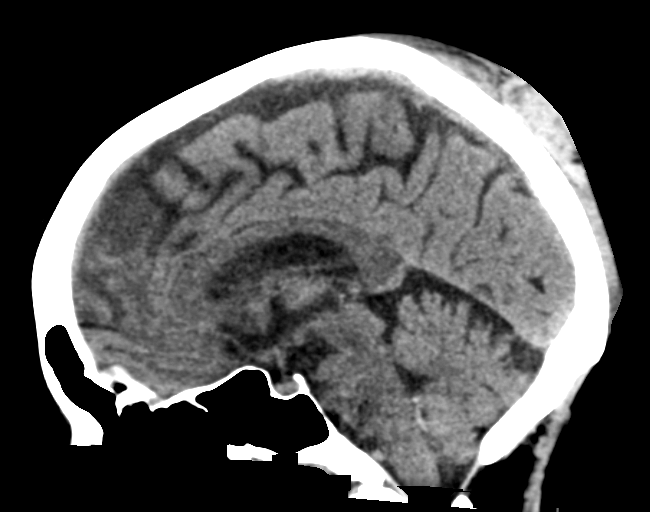
[im 39/59  brain]
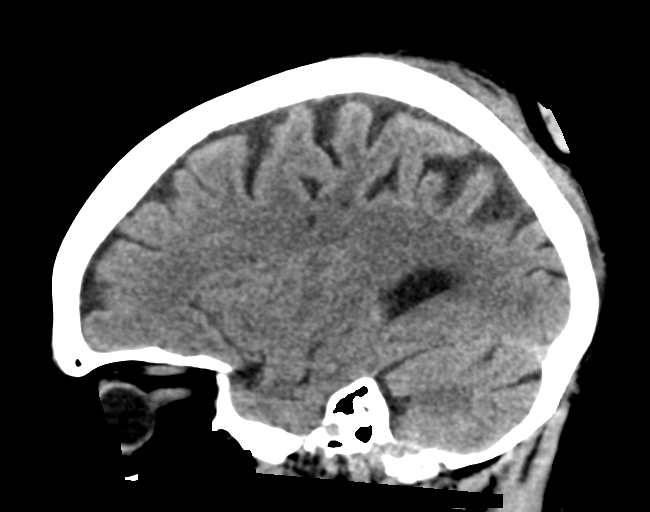

[15 of 47 positions shown; findings below may reference images not displayed]

FINDINGS: In the high parietal region just to the left of midline there is a
high attenuation fluid collection in the parietal scalp, compatible
with a scalp hematoma. No acute displaced skull fractures are
identified. Patchy and confluent areas of decreased attenuation are
noted throughout the deep and periventricular white matter of the
cerebral hemispheres bilaterally, compatible with chronic
microvascular ischemic disease, most evident in the periventricular
white matter of the left frontal lobe. No acute intracranial
abnormality. Specifically, no evidence of acute post-traumatic
intracranial hemorrhage, no definite regions of acute/subacute
cerebral ischemia, no focal mass, mass effect, hydrocephalus or
abnormal intra or extra-axial fluid collections. The visualized
paranasal sinuses and mastoids are well pneumatized.
IMPRESSION: 1. Large scalp hematoma in the high parietal scalp just left of
midline. No underlying displaced skull fracture or findings of
significant acute traumatic injury to the brain. Specifically, no
acute intracranial hemorrhage.
2. Mild chronic microvascular ischemic changes in the
periventricular white matter.

## 2016-03-03 NOTE — ED Notes (Signed)
Pt arrives via ems, ems reports pt fell in her garage and hit her head, is on blood thinners, no loc, no n/v or dizziness, pt a/ox4. Nad.

## 2016-03-03 NOTE — ED Notes (Signed)
Patient transported to CT with CT tech 

## 2016-03-03 NOTE — ED Provider Notes (Signed)
CSN: [id number]     Arrival date & time 03/03/16  1622 History   First MD Initiated Contact with Patient 03/03/16 1627     Chief Complaint  Patient presents with  . Fall     (Consider location/radiation/quality/duration/timing/severity/associated sxs/prior Treatment) Patient is a 75 y.o. female presenting with fall.  Fall This is a new problem. The problem occurs constantly. The problem has not changed since onset.Associated symptoms include headaches. Pertinent negatives include no chest pain, no abdominal pain and no shortness of breath. Nothing aggravates the symptoms. Nothing relieves the symptoms. She has tried nothing for the symptoms. The treatment provided no relief.    Past Medical History  Diagnosis Date  . A-fib ([place])   . Stroke (cerebrum) ([place]) 2015  . Breast cancer ([place]) 2006   Past Surgical History  Procedure Laterality Date  . Breast lumpectomy Right   . Tonsillectomy     Family History  Problem Relation Age of Onset  . Kidney cancer    . Lung cancer Mother   . Pancreatic cancer    . Hypotension Mother   . Diabetes Sister    Social History  Substance Use Topics  . Smoking status: Passive Smoke Exposure - Never Smoker  . Smokeless tobacco: Never Used  . Alcohol Use: No   OB History    No data available     Review of Systems  Constitutional: Negative for fever, chills and fatigue.  Eyes: Negative for pain.  Respiratory: Negative for cough and shortness of breath.   Cardiovascular: Negative for chest pain.  Gastrointestinal: Negative for abdominal pain.  Endocrine: Negative for polydipsia and polyuria.  Genitourinary: Negative for dysuria, pelvic pain and dyspareunia.  Neurological: Positive for headaches.  All other systems reviewed and are negative.     Allergies  Review of patient's allergies indicates no known allergies.  Home Medications   Prior to Admission medications   Medication Sig Start Date End Date Taking? Authorizing Provider   apixaban (ELIQUIS) 5 MG TABS tablet Take 1 tablet (5 mg total) by mouth 2 (two) times daily. Start this med on Saturday 09/02/15 per Dr. [name] 08/29/15   [name], MD  atorvastatin (LIPITOR) 10 MG tablet Take 1 tablet (10 mg total) by mouth daily. 12/04/15   [name], MD  Cholecalciferol (VITAMIN D-3) 1000 UNITS CAPS Take 1 capsule by mouth daily.    Historical Provider, MD  Multiple Vitamins-Minerals (ICAPS AREDS 2 PO) Take 1 capsule by mouth 2 (two) times daily.    Historical Provider, MD  raloxifene (EVISTA) 60 MG tablet Take 60 mg by mouth daily.    Historical Provider, MD  TOPROL XL 25 MG 24 hr tablet Take 1 tablet by mouth 2 (two) times daily. 11/27/15   Historical Provider, MD  Travoprost, BAK Free, (TRAVATAN) 0.004 % SOLN ophthalmic solution Place 1 drop into the left eye daily.    Historical Provider, MD   BP 130/66 mmHg  Pulse 84  Temp(Src) 98 F (36.7 C) (Oral)  Resp 18  SpO2 99% Physical Exam  Constitutional: She appears well-developed and well-nourished.  HENT:  Head: Normocephalic and atraumatic.  Neck: Normal range of motion.  Cardiovascular: Normal rate and regular rhythm.   Pulmonary/Chest: No stridor. No respiratory distress.  Abdominal: She exhibits no distension.  Musculoskeletal:  No cervical spine tenderness, thoracic spine tenderness or Lumbar spine tenderness.  No tenderness or pain with palpation and full ROM of all joints in upper and lower extremities.  No ecchymosis  or other signs of trauma on back or extremities.  No Pain with AP or lateral compression of ribs.  No Paracervical ttp, paraspinal ttp   Neurological: She is alert.  No altered mental status, able to give full seemingly accurate history.  Face is symmetric, EOM's intact, pupils equal and reactive, vision intact, tongue and uvula midline without deviation Upper and Lower extremity motor 5/5, intact pain perception in distal extremities, 2+ reflexes in biceps, patella and  achilles tendons. Finger to nose normal, heel to shin normal. Walks without assistance or evident ataxia.    Nursing note and vitals reviewed.   ED Course  Procedures (including critical care time) Labs Review Labs Reviewed - No data to display  Imaging Review Ct Head Wo Contrast  03/03/2016  CLINICAL DATA:  75 year old female with history of trauma after falling down 2 steps in her garage with injury to the back of the head on concrete. Evaluate for intracranial hemorrhage. Patient is on blood thinners. EXAM: CT HEAD WITHOUT CONTRAST TECHNIQUE: Contiguous axial images were obtained from the base of the skull through the vertex without intravenous contrast. COMPARISON:  No priors. FINDINGS: In the high parietal region just to the left of midline there is a high attenuation fluid collection in the parietal scalp, compatible with a scalp hematoma. No acute displaced skull fractures are identified. Patchy and confluent areas of decreased attenuation are noted throughout the deep and periventricular white matter of the cerebral hemispheres bilaterally, compatible with chronic microvascular ischemic disease, most evident in the periventricular white matter of the left frontal lobe. No acute intracranial abnormality. Specifically, no evidence of acute post-traumatic intracranial hemorrhage, no definite regions of acute/subacute cerebral ischemia, no focal mass, mass effect, hydrocephalus or abnormal intra or extra-axial fluid collections. The visualized paranasal sinuses and mastoids are well pneumatized. IMPRESSION: 1. Large scalp hematoma in the high parietal scalp just left of midline. No underlying displaced skull fracture or findings of significant acute traumatic injury to the brain. Specifically, no acute intracranial hemorrhage. 2. Mild chronic microvascular ischemic changes in the periventricular white matter. Electronically Signed   By: Vinnie Langton M.D.   On: 03/03/2016 19:53   I have  personally reviewed and evaluated these images and lab results as part of my medical decision-making.   EKG Interpretation None      MDM   Final diagnoses:  Fall, initial encounter   Mechanical fall on eliquis with head trauma. CT negative. Neurologic exam benign. Discharge instructions given regarding delayed bleed and to return to ED for worsening or new neurologic symptoms.  New Prescriptions: Discharge Medication List as of 03/03/2016  8:00 PM       I have personally and contemperaneously reviewed labs and imaging and used in my decision making as above.   A medical screening exam was performed and I feel the patient has had an appropriate workup for their chief complaint at this time and likelihood of emergent condition existing is low and thus workup can continue on an outpatient basis.. Their vital signs are stable. They have been counseled on decision, discharge, follow up and which symptoms necessitate immediate return to the emergency department.  They verbally stated understanding and agreement with plan and discharged in stable condition.      Merrily Pew, MD 03/03/16 2212

## 2016-03-03 NOTE — ED Notes (Signed)
CT contacted by this RN to obtain time frame of when they may be able to take patient over for her CT, Johnny in CT estimated approximately 30 minutes until they can get patient over. Dr. Dolly Rias and patient and husband made aware.

## 2016-03-07 DIAGNOSIS — H401122 Primary open-angle glaucoma, left eye, moderate stage: Secondary | ICD-10-CM | POA: Diagnosis not present

## 2016-03-20 DIAGNOSIS — Z85820 Personal history of malignant melanoma of skin: Secondary | ICD-10-CM | POA: Diagnosis not present

## 2016-03-20 DIAGNOSIS — D2372 Other benign neoplasm of skin of left lower limb, including hip: Secondary | ICD-10-CM | POA: Diagnosis not present

## 2016-03-20 DIAGNOSIS — D1801 Hemangioma of skin and subcutaneous tissue: Secondary | ICD-10-CM | POA: Diagnosis not present

## 2016-03-20 DIAGNOSIS — L821 Other seborrheic keratosis: Secondary | ICD-10-CM | POA: Diagnosis not present

## 2016-03-20 DIAGNOSIS — D225 Melanocytic nevi of trunk: Secondary | ICD-10-CM | POA: Diagnosis not present

## 2016-03-20 DIAGNOSIS — D2261 Melanocytic nevi of right upper limb, including shoulder: Secondary | ICD-10-CM | POA: Diagnosis not present

## 2016-03-20 DIAGNOSIS — L814 Other melanin hyperpigmentation: Secondary | ICD-10-CM | POA: Diagnosis not present

## 2016-03-29 ENCOUNTER — Telehealth: Payer: Self-pay | Admitting: Gastroenterology

## 2016-03-29 NOTE — Telephone Encounter (Signed)
done

## 2016-04-01 NOTE — Telephone Encounter (Signed)
Dr. Loletha Carrow reviewed records and has accepted patient. Ok to schedule Direct Colon for 06/2016. Patient states that she will have to callback and schedule.

## 2016-04-02 ENCOUNTER — Encounter: Payer: Self-pay | Admitting: Gastroenterology

## 2016-05-20 ENCOUNTER — Other Ambulatory Visit: Payer: Self-pay | Admitting: *Deleted

## 2016-05-20 MED ORDER — TOPROL XL 25 MG PO TB24: 25.0000 mg | ORAL_TABLET | Freq: Two times a day (BID) | ORAL | 1 refills | Status: DC

## 2016-06-06 ENCOUNTER — Ambulatory Visit (INDEPENDENT_AMBULATORY_CARE_PROVIDER_SITE_OTHER): Payer: Medicare Other | Admitting: Gastroenterology

## 2016-06-06 ENCOUNTER — Encounter: Payer: Self-pay | Admitting: Gastroenterology

## 2016-06-06 ENCOUNTER — Telehealth: Payer: Self-pay

## 2016-06-06 VITALS — BP 120/70 | HR 72 | Ht 65.0 in | Wt 139.4 lb

## 2016-06-06 DIAGNOSIS — Z86010 Personal history of colonic polyps: Secondary | ICD-10-CM | POA: Diagnosis not present

## 2016-06-06 DIAGNOSIS — I480 Paroxysmal atrial fibrillation: Secondary | ICD-10-CM

## 2016-06-06 MED ORDER — NA SULFATE-K SULFATE-MG SULF 17.5-3.13-1.6 GM/177ML PO SOLN: 1.0000 | Freq: Once | ORAL | 0 refills | Status: DC

## 2016-06-06 MED ORDER — NA SULFATE-K SULFATE-MG SULF 17.5-3.13-1.6 GM/177ML PO SOLN: 1.0000 | Freq: Once | ORAL | 0 refills | Status: AC

## 2016-06-06 NOTE — Telephone Encounter (Signed)
  RE:                Darlane Triplet DOB:             Jul 31, 1941 MRN:             [id number]   Dear Dr Liane Comber,    We have scheduled the above patient for an endoscopic procedure (colonoscopy). Our records show that she is on anticoagulation therapy.   Please advise as to how long the patient may come off her therapy of Eliquis prior to the procedure, which is scheduled for 08-14-2016.  Please fax back / or route the completed form to Christell Constant at [phone].   Sincerely,   Magdalene River, CMA

## 2016-06-06 NOTE — Telephone Encounter (Signed)
Pt takes Eliquis for afib, CHADS2 score is 3 (HTN and CVA in 11/2014). Given history of stroke, recommend that pt only remain off Eliquis for 24 hours prior to colonoscopy and resume ASAP after. Clearance routed to [employer].

## 2016-06-06 NOTE — Progress Notes (Signed)
[facility] Consult Note:  History: Angela Carpenter 06/06/2016  Referring physician: Haywood Pao, MD  Reason for consult/chief complaint: hx of colon polyps (pt on Eliquis)   Subjective  HPI:  Angela Carpenter was referred by primary care for history of colon polyps. [place] Previous records indicate that in 2008 she had what was described by pathology as a "common villous adenoma", then in 2011 she had a 12 mm tubular adenoma. In 2014 she had a 3 mm tubular adenoma and a small hyperplastic polyp. In early 2016 she suffered a CVA and then was diagnosed with paroxysmal A. fib. Her residual neurologic deficit is occasional word finding difficulty without motor or sensory deficits. [name] denies abdominal pain altered bowel habits or rectal bleeding. She reports that her bowel preparation was poor in either 2011 or 2014  and the exam had to be repeated with better prep.  ROS:  Review of Systems She denies chest pain dyspnea or dysuria. Occasional palpitations  Past Medical History: Past Medical History:  Diagnosis Date  . A-fib ([place])   . Breast cancer ([place]) 2006  . Colon polyps   . Glaucoma   . Hypercholesteremia   . Hypertension   . Osteoporosis   . Stroke (cerebrum) ([place]) 2015     Past Surgical History: Past Surgical History:  Procedure Laterality Date  . BREAST LUMPECTOMY Right 2006  . TONSILLECTOMY       Family History: Family History  Problem Relation Age of Onset  . Lung cancer Mother   . Hypotension Mother   . Pancreatic cancer Father   . Hypertension Sister   . Kidney cancer Brother   . Cirrhosis Maternal Grandfather   . Breast cancer Paternal Grandmother   . Cirrhosis Paternal Grandfather     Social History: Social History   Social History  . Marital status: Married    Spouse name: N/A  . Number of children: 1  . Years of education: N/A   Occupational History  . retired    Social History Main Topics  . Smoking  status: Passive Smoke Exposure - Never Smoker  . Smokeless tobacco: Never Used     Comment: smoked in college but didn't inhale  . Alcohol use No  . Drug use: No  . Sexual activity: Not Asked   Other Topics Concern  . None   Social History Narrative  . None    Allergies: No Known Allergies  Outpatient Meds: Current Outpatient Prescriptions  Medication Sig Dispense Refill  . apixaban (ELIQUIS) 5 MG TABS tablet Take 1 tablet (5 mg total) by mouth 2 (two) times daily. Start this med on Saturday 09/02/15 per Dr. [name] 180 tablet 3  . atorvastatin (LIPITOR) 10 MG tablet Take 1 tablet (10 mg total) by mouth daily. 90 tablet 3  . Cholecalciferol (VITAMIN D-3) 1000 UNITS CAPS Take 1 capsule by mouth daily.    . Multiple Vitamins-Minerals (ICAPS AREDS 2 PO) Take 1 capsule by mouth 2 (two) times daily.    . raloxifene (EVISTA) 60 MG tablet Take 60 mg by mouth daily.    . TOPROL XL 25 MG 24 hr tablet Take 1 tablet (25 mg total) by mouth 2 (two) times daily. 180 tablet 1  . Travoprost, BAK Free, (TRAVATAN) 0.004 % SOLN ophthalmic solution Place 1 drop into the left eye daily.    . Na Sulfate-K Sulfate-Mg Sulf 17.5-3.13-1.6 GM/180ML SOLN Take 1 kit by mouth once. 354 mL 0   No current  facility-administered medications for this visit.       ___________________________________________________________________ Objective   Exam:  BP 120/70 (BP Location: Left Arm, Patient Position: Sitting, Cuff Size: Normal)   Pulse 72   Ht '5\' 5"'  (1.651 m) Comment: height measured without shoes  Wt 139 lb 6 oz (63.2 kg)   BMI 23.19 kg/m    General: this is a(n) Well-appearing elderly woman   Eyes: sclera anicteric, no redness  ENT: oral mucosa moist without lesions, no cervical or supraclavicular lymphadenopathy, good dentition  CV: RRR without murmur, S1/S2, no JVD, no peripheral edema  Resp: clear to auscultation bilaterally, normal RR and effort noted  GI: soft, no tenderness, with active  bowel sounds. No guarding or palpable organomegaly noted.  Skin; warm and dry, no rash or jaundice noted  Neuro: awake, alert and oriented x 3. Normal gross motor function and fluent speech  Assessment: Encounter Diagnoses  Name Primary?  Marland Kitchen PAF (paroxysmal atrial fibrillation) ([place]) Yes  . Personal history of colonic polyps     Given her documented history, a repeat colonoscopy in 3-5 years out from her most recent one seems reasonable, especially since the quality of the preparation is questionable. She is in a regular heart rhythm today Plan:  Colonoscopy with Suprep and MiraLAX twice daily for 1 week prior. She will need to be off oral anticoagulant 2 days prior to procedure and perhaps up to 3 or 5 days afterwards if polyps are removed. We will need to get an opinion on this from her cardiologist, Dr. [name].   Thank you for the courtesy of this consult.  Please call me with any questions or concerns.  Nelida Meuse III  CC: Haywood Pao, MD

## 2016-06-06 NOTE — Telephone Encounter (Signed)
Would you be able to call her? Thank you, K

## 2016-06-06 NOTE — Patient Instructions (Signed)
If you are age 73 or older, your body mass index should be between 23-30. Your Body mass index is 23.19 kg/m. If this is out of the aforementioned range listed, please consider follow up with your Primary Care Provider.  If you are age 30 or younger, your body mass index should be between 19-25. Your Body mass index is 23.19 kg/m. If this is out of the aformentioned range listed, please consider follow up with your Primary Care Provider.   You have been scheduled for a colonoscopy. Please follow written instructions given to you at your visit today.  Please pick up your prep supplies at the pharmacy within the next 1-3 days. If you use inhalers (even only as needed), please bring them with you on the day of your procedure. Your physician has requested that you go to www.startemmi.com and enter the access code given to you at your visit today. This web site gives a general overview about your procedure. However, you should still follow specific instructions given to you by our office regarding your preparation for the procedure.  We have sent the following medications to your pharmacy for you to pick up at your convenience: Suprep  Thank you for choosing [facility]  Dr Wilfrid Lund III

## 2016-06-06 NOTE — Telephone Encounter (Signed)
That will be fine.  Thanks for letting me know.

## 2016-06-07 NOTE — Telephone Encounter (Signed)
Pt notified and aware. She states clear understanding to hold 24 hours prior to her procedure.

## 2016-07-06 DIAGNOSIS — Z23 Encounter for immunization: Secondary | ICD-10-CM | POA: Diagnosis not present

## 2016-08-14 ENCOUNTER — Ambulatory Visit (AMBULATORY_SURGERY_CENTER): Payer: Medicare Other | Admitting: Gastroenterology

## 2016-08-14 ENCOUNTER — Encounter: Payer: Self-pay | Admitting: Gastroenterology

## 2016-08-14 VITALS — BP 112/50 | HR 72 | Temp 97.5°F | Resp 11 | Ht 65.0 in | Wt 139.0 lb

## 2016-08-14 DIAGNOSIS — I10 Essential (primary) hypertension: Secondary | ICD-10-CM | POA: Diagnosis not present

## 2016-08-14 DIAGNOSIS — I4891 Unspecified atrial fibrillation: Secondary | ICD-10-CM | POA: Diagnosis not present

## 2016-08-14 DIAGNOSIS — Z8673 Personal history of transient ischemic attack (TIA), and cerebral infarction without residual deficits: Secondary | ICD-10-CM | POA: Diagnosis not present

## 2016-08-14 DIAGNOSIS — Z86010 Personal history of colonic polyps: Secondary | ICD-10-CM | POA: Diagnosis not present

## 2016-08-14 MED ORDER — SODIUM CHLORIDE 0.9 % IV SOLN: 500.0000 mL | INTRAVENOUS | Status: DC

## 2016-08-14 NOTE — Progress Notes (Signed)
Report to PACU, RN, vss, BBS= Clear.  

## 2016-08-14 NOTE — Patient Instructions (Signed)
Diverticulosis seen today. Handout given on diverticulosis.  Resume current  Medications today. Resume Eliquis at prior dose today.  Call us with any questions or concerns. Thank you!!  YOU HAD AN ENDOSCOPIC PROCEDURE TODAY AT [facility]:   Refer to the procedure report that was given to you for any specific questions about what was found during the examination.  If the procedure report does not answer your questions, please call your gastroenterologist to clarify.  If you requested that your care partner not be given the details of your procedure findings, then the procedure report has been included in a sealed envelope for you to review at your convenience later.  YOU SHOULD EXPECT: Some feelings of bloating in the abdomen. Passage of more gas than usual.  Walking can help get rid of the air that was put into your GI tract during the procedure and reduce the bloating. If you had a lower endoscopy (such as a colonoscopy or flexible sigmoidoscopy) you may notice spotting of blood in your stool or on the toilet paper. If you underwent a bowel prep for your procedure, you may not have a normal bowel movement for a few days.  Please Note:  You might notice some irritation and congestion in your nose or some drainage.  This is from the oxygen used during your procedure.  There is no need for concern and it should clear up in a day or so.  SYMPTOMS TO REPORT IMMEDIATELY:   Following lower endoscopy (colonoscopy or flexible sigmoidoscopy):  Excessive amounts of blood in the stool  Significant tenderness or worsening of abdominal pains  Swelling of the abdomen that is new, acute  Fever of 100F or higher   For urgent or emergent issues, a gastroenterologist can be reached at any hour by calling [phone].   DIET:  We do recommend a small meal at first, but then you may proceed to your regular diet.  Drink plenty of fluids but you should avoid alcoholic beverages for 24  hours.  ACTIVITY:  You should plan to take it easy for the rest of today and you should NOT DRIVE or use heavy machinery until tomorrow (because of the sedation medicines used during the test).    FOLLOW UP: Our staff will call the number listed on your records the next business day following your procedure to check on you and address any questions or concerns that you may have regarding the information given to you following your procedure. If we do not reach you, we will leave a message.  However, if you are feeling well and you are not experiencing any problems, there is no need to return our call.  We will assume that you have returned to your regular daily activities without incident.  If any biopsies were taken you will be contacted by phone or by letter within the next 1-3 weeks.  Please call us at [phone] if you have not heard about the biopsies in 3 weeks.    SIGNATURES/CONFIDENTIALITY: You and/or your care partner have signed paperwork which will be entered into your electronic medical record.  These signatures attest to the fact that that the information above on your After Visit Summary has been reviewed and is understood.  Full responsibility of the confidentiality of this discharge information lies with you and/or your care-partner.

## 2016-08-14 NOTE — Op Note (Signed)
[facility] Patient Name: Angela Carpenter Procedure Date: 08/14/2016 8:51 AM MRN: [id number] Endoscopist: Pender. Loletha Carrow , MD Age: 75 Referring MD:  Date of Birth: 02/28/1941 Gender: Female Account #: [id number] Procedure:                Colonoscopy Indications:              Surveillance: Personal history of adenomatous                            polyps on last colonoscopy > 3 years ago Medicines:                Monitored Anesthesia Care Procedure:                Pre-Anesthesia Assessment:                           - Prior to the procedure, a History and Physical                            was performed, and patient medications and                            allergies were reviewed. The patient's tolerance of                            previous anesthesia was also reviewed. The risks                            and benefits of the procedure and the sedation                            options and risks were discussed with the patient.                            All questions were answered, and informed consent                            was obtained. Prior Anticoagulants: The patient has                            taken Eliquis (apixaban), last dose was 2 days                            prior to procedure. ASA Grade Assessment: III - A                            patient with severe systemic disease. After                            reviewing the risks and benefits, the patient was                            deemed in satisfactory condition to undergo the  procedure.                           After obtaining informed consent, the colonoscope                            was passed under direct vision. Throughout the                            procedure, the patient's blood pressure, pulse, and                            oxygen saturations were monitored continuously. The                            [id number]) was introduced through the anus                      and advanced to the the cecum, identified by                            appendiceal orifice and ileocecal valve. The                            colonoscopy was performed without difficulty. The                            patient tolerated the procedure well. The quality                            of the bowel preparation was good after lavage. The                            ileocecal valve, appendiceal orifice, and rectum                            were photographed. The bowel preparation used was                            SUPREP and Miralax. The quality of the bowel                            preparation was evaluated using the BBPS [facility]                            Bowel Preparation Scale) with scores of: Right                            Colon = 2, Transverse Colon = 2 and Left Colon = 2.                            The total BBPS score equals 6. Scope In: 9:01:22 AM Scope Out: 9:18:33 AM Scope Withdrawal Time: 0 hours 11 minutes 24 seconds  Total Procedure Duration: 0 hours 17 minutes  11 seconds  Findings:                 The perianal and digital rectal examinations were                            normal.                           A few small-mouthed diverticula were found in the                            left colon.                           The exam was otherwise without abnormality on                            direct and retroflexion views. Complications:            No immediate complications. Estimated Blood Loss:     Estimated blood loss: none. Impression:               - Diverticulosis in the left colon.                           - The examination was otherwise normal on direct                            and retroflexion views.                           - No specimens collected. Recommendation:           - Patient has a contact number available for                            emergencies. The signs and symptoms of potential                             delayed complications were discussed with the                            patient. Return to normal activities tomorrow.                            Written discharge instructions were provided to the                            patient.                           - Resume previous diet.                           - Resume Eliquis (apixaban) at prior dose today.                           - No repeat routine colonoscopy  due to age. Henry L. Loletha Carrow, MD 08/14/2016 9:25:26 AM This report has been signed electronically.

## 2016-08-15 ENCOUNTER — Telehealth: Payer: Self-pay | Admitting: *Deleted

## 2016-08-15 NOTE — Telephone Encounter (Signed)
  Follow up Call-  Call back number 08/14/2016  Post procedure Call Back phone  # [phone]  Permission to leave phone message Yes  Some recent data might be hidden     Patient questions:  Do you have a fever, pain , or abdominal swelling? No. Pain Score  0 *  Have you tolerated food without any problems? Yes.    Have you been able to return to your normal activities? Yes.    Do you have any questions about your discharge instructions: Diet   No. Medications  No. Follow up visit  No.  Do you have questions or concerns about your Care? No.  Actions: * If pain score is 4 or above: No action needed, pain <4.

## 2016-08-20 DIAGNOSIS — H409 Unspecified glaucoma: Secondary | ICD-10-CM | POA: Diagnosis not present

## 2016-08-20 DIAGNOSIS — Z853 Personal history of malignant neoplasm of breast: Secondary | ICD-10-CM | POA: Diagnosis not present

## 2016-08-20 DIAGNOSIS — E7800 Pure hypercholesterolemia, unspecified: Secondary | ICD-10-CM | POA: Diagnosis not present

## 2016-08-20 DIAGNOSIS — I10 Essential (primary) hypertension: Secondary | ICD-10-CM | POA: Diagnosis not present

## 2016-08-20 DIAGNOSIS — C4362 Malignant melanoma of left upper limb, including shoulder: Secondary | ICD-10-CM | POA: Diagnosis not present

## 2016-08-20 DIAGNOSIS — I639 Cerebral infarction, unspecified: Secondary | ICD-10-CM | POA: Diagnosis not present

## 2016-08-20 DIAGNOSIS — M810 Age-related osteoporosis without current pathological fracture: Secondary | ICD-10-CM | POA: Diagnosis not present

## 2016-08-20 DIAGNOSIS — I480 Paroxysmal atrial fibrillation: Secondary | ICD-10-CM | POA: Diagnosis not present

## 2016-08-20 DIAGNOSIS — Z7901 Long term (current) use of anticoagulants: Secondary | ICD-10-CM | POA: Diagnosis not present

## 2016-08-20 DIAGNOSIS — Z6821 Body mass index (BMI) 21.0-21.9, adult: Secondary | ICD-10-CM | POA: Diagnosis not present

## 2016-08-20 DIAGNOSIS — D126 Benign neoplasm of colon, unspecified: Secondary | ICD-10-CM | POA: Diagnosis not present

## 2016-08-20 DIAGNOSIS — C4491 Basal cell carcinoma of skin, unspecified: Secondary | ICD-10-CM | POA: Diagnosis not present

## 2016-09-02 DIAGNOSIS — Z85820 Personal history of malignant melanoma of skin: Secondary | ICD-10-CM | POA: Diagnosis not present

## 2016-09-02 DIAGNOSIS — D1801 Hemangioma of skin and subcutaneous tissue: Secondary | ICD-10-CM | POA: Diagnosis not present

## 2016-09-02 DIAGNOSIS — L570 Actinic keratosis: Secondary | ICD-10-CM | POA: Diagnosis not present

## 2016-09-02 DIAGNOSIS — L814 Other melanin hyperpigmentation: Secondary | ICD-10-CM | POA: Diagnosis not present

## 2016-09-02 DIAGNOSIS — L821 Other seborrheic keratosis: Secondary | ICD-10-CM | POA: Diagnosis not present

## 2016-09-02 DIAGNOSIS — D2372 Other benign neoplasm of skin of left lower limb, including hip: Secondary | ICD-10-CM | POA: Diagnosis not present

## 2016-09-02 DIAGNOSIS — D485 Neoplasm of uncertain behavior of skin: Secondary | ICD-10-CM | POA: Diagnosis not present

## 2016-09-06 DIAGNOSIS — H401111 Primary open-angle glaucoma, right eye, mild stage: Secondary | ICD-10-CM | POA: Diagnosis not present

## 2016-09-06 DIAGNOSIS — H401122 Primary open-angle glaucoma, left eye, moderate stage: Secondary | ICD-10-CM | POA: Diagnosis not present

## 2016-09-09 ENCOUNTER — Other Ambulatory Visit: Payer: Self-pay | Admitting: Cardiology

## 2016-10-25 DIAGNOSIS — H401132 Primary open-angle glaucoma, bilateral, moderate stage: Secondary | ICD-10-CM | POA: Diagnosis not present

## 2016-11-22 ENCOUNTER — Encounter: Payer: Self-pay | Admitting: Cardiology

## 2016-12-04 ENCOUNTER — Encounter: Payer: Self-pay | Admitting: Cardiology

## 2016-12-04 ENCOUNTER — Ambulatory Visit (INDEPENDENT_AMBULATORY_CARE_PROVIDER_SITE_OTHER): Payer: Medicare Other | Admitting: Cardiology

## 2016-12-04 VITALS — BP 112/62 | HR 69 | Ht 65.0 in | Wt 138.0 lb

## 2016-12-04 DIAGNOSIS — Z7901 Long term (current) use of anticoagulants: Secondary | ICD-10-CM | POA: Diagnosis not present

## 2016-12-04 DIAGNOSIS — I119 Hypertensive heart disease without heart failure: Secondary | ICD-10-CM

## 2016-12-04 DIAGNOSIS — Z9229 Personal history of other drug therapy: Secondary | ICD-10-CM | POA: Insufficient documentation

## 2016-12-04 DIAGNOSIS — E784 Other hyperlipidemia: Secondary | ICD-10-CM | POA: Diagnosis not present

## 2016-12-04 DIAGNOSIS — I480 Paroxysmal atrial fibrillation: Secondary | ICD-10-CM | POA: Diagnosis not present

## 2016-12-04 DIAGNOSIS — E7849 Other hyperlipidemia: Secondary | ICD-10-CM

## 2016-12-04 MED ORDER — TOPROL XL 25 MG PO TB24: 25.0000 mg | ORAL_TABLET | Freq: Two times a day (BID) | ORAL | 3 refills | Status: DC

## 2016-12-04 MED ORDER — ATORVASTATIN CALCIUM 10 MG PO TABS: 10.0000 mg | ORAL_TABLET | Freq: Every day | ORAL | 3 refills | Status: DC

## 2016-12-04 NOTE — Progress Notes (Signed)
Patient ID: Angela Carpenter, female   DOB: October 17, 1940, 76 y.o.   MRN: [id number]      Cardiology Office Note  Date:  12/04/2016   ID:  Angela Carpenter, DOB [birth year], MRN [id number]  PCP:  Haywood Pao, MD  Cardiologist:   [name], MD   Chief complain: To establish cardiology care   History of Present Illness: Angela Carpenter is a 76 y.o. female with h/o hypertension, hyperlipidemia, s/p CVA in 11/2014, s/p Linq implantation and removal, diagnosis of paroxysmal atrial fibrillation and initiation of warfarin. She is very active walking 2 miles /day without any symptoms. No bleeding with warfarin. No palpitations or syncope.  No CP or DOE.  Her echocardiogram showed LVEF 60-65%, normal RVEF, normal LA size, mild MR, otherwise normal echo. On ECG in 06/2015 she was in [place]. She is tolerating atorvastatin well.   12/03/2016 -1 year follow-up, the patient feels and looks great, she goes to gym at least 4 times a week and has lost 6 pounds since last year. She's been compliant with her meds and denies any bleeding or muscle pain. She has no chest pain or shortness of breath no palpitation claudication or syncope. She is going through major stress as her husband has progressive Alzheimer complicated by depression and doesn't leak house refuses to do anything.  Past Medical History:  Diagnosis Date  . A-fib ([place])   . Breast cancer ([place]) 2006  . Colon polyps   . Glaucoma   . Hypercholesteremia   . Hypertension   . Osteoporosis   . Stroke (cerebrum) ([place]) 2015    Past Surgical History:  Procedure Laterality Date  . BREAST LUMPECTOMY Right 2006  . TONSILLECTOMY       Current Outpatient Prescriptions  Medication Sig Dispense Refill  . apixaban (ELIQUIS) 5 MG TABS tablet Take 1 tablet (5 mg total) by mouth 2 (two) times daily. 180 tablet 0  . atorvastatin (LIPITOR) 10 MG tablet Take 1 tablet (10 mg total) by mouth daily. 90 tablet 3  . Cholecalciferol (VITAMIN D-3) 1000 UNITS CAPS Take 1  capsule by mouth daily.    . Multiple Vitamins-Minerals (ICAPS AREDS 2 PO) Take 1 capsule by mouth 2 (two) times daily.    . raloxifene (EVISTA) 60 MG tablet Take 60 mg by mouth daily.    . timolol (TIMOPTIC) 0.5 % ophthalmic solution Place 1 drop into both eyes 2 (two) times daily.    . TOPROL XL 25 MG 24 hr tablet Take 1 tablet (25 mg total) by mouth 2 (two) times daily. 180 tablet 1   Current Facility-Administered Medications  Medication Dose Route Frequency Provider Last Rate Last Dose  . 0.9 %  sodium chloride infusion  500 mL Intravenous Continuous Nelida Meuse III, MD        Allergies:   Patient has no known allergies.    Social History:  The patient  reports that she is a non-smoker but has been exposed to tobacco smoke. She has never used smokeless tobacco. She reports that she does not drink alcohol or use drugs.   Family History:  The patient's family history includes Breast cancer in her paternal grandmother; Cirrhosis in her maternal grandfather and paternal grandfather; Hypertension in her sister; Hypotension in her mother; Kidney cancer in her brother; Lung cancer in her mother; Pancreatic cancer in her father.   ROS:  Please see the history of present illness.   Otherwise, review of systems are positive for none.   All  other systems are reviewed and negative.   PHYSICAL EXAM: VS:  BP 112/62   Pulse 69   Ht 5\' 5"  (1.651 m)   Wt 138 lb (62.6 kg)   BMI 22.96 kg/m  , BMI Body mass index is 22.96 kg/m. GEN: Well nourished, well developed, in no acute distress  HEENT: normal  Neck: no JVD, carotid bruits, or masses Cardiac: RRR; no murmurs, rubs, or gallops,no edema  Respiratory:  clear to auscultation bilaterally, normal work of breathing GI: soft, nontender, nondistended, + BS MS: no deformity or atrophy  Skin: warm and dry, no rash Neuro:  Strength and sensation are intact Psych: euthymic mood, full affect  Recent Labs: No results found for requested labs within  last 8760 hours.   Lipid Panel    Component Value Date/Time   CHOL 155 09/05/2015 0828   TRIG 90 09/05/2015 0828   HDL 84 09/05/2015 0828   CHOLHDL 1.8 09/05/2015 0828   VLDL 18 09/05/2015 0828   LDLCALC 53 09/05/2015 0828     Wt Readings from Last 3 Encounters:  12/04/16 138 lb (62.6 kg)  08/14/16 139 lb (63 kg)  06/06/16 139 lb 6 oz (63.2 kg)    ECG: Personally reviewed showed normal sinus rhythm normal EKG unchanged from prior.    ASSESSMENT AND PLAN:  1. Paroxysmal atrial fibrillation - on chronic anticoagulation, this is non-valvular a-fib, we switched her to Eliquis that she tolerates very well and has no bleeding. TSH normal. She is not aware of when she has atrial fibrillation however she didn't feel any palpitations since last year.  2. Hypertension - controlled.  3. Hyperlipidemia - on atorvastatin 20 mg po daily, her labs in November 2016 showed excellent lipid control and minimally elevated LFTs, we decreased atorvastatin to take 10 daily. We will repeat CMP, lipids, TSH and CBC now.  Follow up in 1 year.   Signed, [name], MD  12/04/2016 8:54 AM    [facility] [place] [place], [place], [place]  [address] Phone: [phone]; Fax: [phone]

## 2016-12-04 NOTE — Patient Instructions (Signed)
Medication Instructions:   Your physician recommends that you continue on your current medications as directed. Please refer to the Current Medication list given to you today.     Labwork:  TOMORROW IF POSSIBLE OR WITHIN THE NEAR FUTURE TO CHECK A---CMET, CBC W DIFF, TSH, AND LIPIDS----PLEASE COME FASTING TO THIS LAB APPOINTMENT      Follow-Up:  Your physician wants you to follow-up in: [place] will receive a reminder letter in the mail two months in advance. If you don't receive a letter, please call our office to schedule the follow-up appointment.        If you need a refill on your cardiac medications before your next appointment, please call your pharmacy.

## 2016-12-05 ENCOUNTER — Other Ambulatory Visit: Payer: Medicare Other | Admitting: *Deleted

## 2016-12-05 DIAGNOSIS — Z9229 Personal history of other drug therapy: Secondary | ICD-10-CM

## 2016-12-05 DIAGNOSIS — E7849 Other hyperlipidemia: Secondary | ICD-10-CM

## 2016-12-05 DIAGNOSIS — Z7901 Long term (current) use of anticoagulants: Secondary | ICD-10-CM | POA: Diagnosis not present

## 2016-12-05 DIAGNOSIS — I119 Hypertensive heart disease without heart failure: Secondary | ICD-10-CM

## 2016-12-05 DIAGNOSIS — I480 Paroxysmal atrial fibrillation: Secondary | ICD-10-CM

## 2016-12-05 DIAGNOSIS — E784 Other hyperlipidemia: Secondary | ICD-10-CM | POA: Diagnosis not present

## 2016-12-05 LAB — LIPID PANEL
Chol/HDL Ratio: 2.1 ratio units (ref 0.0–4.4)
Cholesterol, Total: 151 mg/dL (ref 100–199)
HDL: 72 mg/dL (ref >39)
LDL Calculated: 65 mg/dL (ref 0–99)
Triglycerides: 69 mg/dL (ref 0–149)
VLDL Cholesterol Cal: 14 mg/dL (ref 5–40)

## 2016-12-05 LAB — CBC WITH DIFFERENTIAL/PLATELET
Basophils Absolute: 0.1 10*3/uL (ref 0.0–0.2)
Basos: 1 %
EOS (ABSOLUTE): 0.3 10*3/uL (ref 0.0–0.4)
Eos: 4 %
Hematocrit: 41.1 % (ref 34.0–46.6)
Hemoglobin: 13.6 g/dL (ref 11.1–15.9)
Immature Grans (Abs): 0 10*3/uL (ref 0.0–0.1)
Immature Granulocytes: 0 %
Lymphocytes Absolute: 2.1 10*3/uL (ref 0.7–3.1)
Lymphs: 27 %
MCH: 29.8 pg (ref 26.6–33.0)
MCHC: 33.1 g/dL (ref 31.5–35.7)
MCV: 90 fL (ref 79–97)
Monocytes Absolute: 0.8 10*3/uL (ref 0.1–0.9)
Monocytes: 10 %
Neutrophils Absolute: 4.5 10*3/uL (ref 1.4–7.0)
Neutrophils: 58 %
Platelets: 301 10*3/uL (ref 150–379)
RBC: 4.56 x10E6/uL (ref 3.77–5.28)
RDW: 13.4 % (ref 12.3–15.4)
WBC: 7.8 10*3/uL (ref 3.4–10.8)

## 2016-12-05 LAB — COMPREHENSIVE METABOLIC PANEL
ALT: 21 IU/L (ref 0–32)
AST: 25 IU/L (ref 0–40)
Albumin/Globulin Ratio: 2 (ref 1.2–2.2)
Albumin: 4.4 g/dL (ref 3.5–4.8)
Alkaline Phosphatase: 71 IU/L (ref 39–117)
BUN/Creatinine Ratio: 23 (ref 12–28)
BUN: 15 mg/dL (ref 8–27)
Bilirubin Total: 0.3 mg/dL (ref 0.0–1.2)
CO2: 24 mmol/L (ref 18–29)
Calcium: 9.4 mg/dL (ref 8.7–10.3)
Chloride: 98 mmol/L (ref 96–106)
Creatinine, Ser: 0.66 mg/dL (ref 0.57–1.00)
GFR calc Af Amer: 101 mL/min/{1.73_m2} (ref >59)
GFR calc non Af Amer: 88 mL/min/{1.73_m2} (ref >59)
Globulin, Total: 2.2 g/dL (ref 1.5–4.5)
Glucose: 92 mg/dL (ref 65–99)
Potassium: 4.7 mmol/L (ref 3.5–5.2)
Sodium: 138 mmol/L (ref 134–144)
Total Protein: 6.6 g/dL (ref 6.0–8.5)

## 2016-12-05 LAB — TSH: TSH: 4.34 u[IU]/mL (ref 0.450–4.500)

## 2016-12-21 ENCOUNTER — Other Ambulatory Visit: Payer: Self-pay | Admitting: Cardiology

## 2017-01-15 ENCOUNTER — Other Ambulatory Visit: Payer: Self-pay | Admitting: Internal Medicine

## 2017-01-15 DIAGNOSIS — Z1231 Encounter for screening mammogram for malignant neoplasm of breast: Secondary | ICD-10-CM

## 2017-02-04 DIAGNOSIS — H401131 Primary open-angle glaucoma, bilateral, mild stage: Secondary | ICD-10-CM | POA: Diagnosis not present

## 2017-02-14 ENCOUNTER — Ambulatory Visit
Admission: RE | Admit: 2017-02-14 | Discharge: 2017-02-14 | Disposition: A | Discharge status: Home / Self Care | Payer: Medicare Other | Source: Ambulatory Visit | Attending: Internal Medicine | Admitting: Internal Medicine

## 2017-02-14 DIAGNOSIS — Z1231 Encounter for screening mammogram for malignant neoplasm of breast: Secondary | ICD-10-CM

## 2017-02-14 HISTORY — DX: Personal history of antineoplastic chemotherapy: Z92.21

## 2017-02-14 HISTORY — DX: Personal history of irradiation: Z92.3

## 2017-02-20 DIAGNOSIS — E7800 Pure hypercholesterolemia, unspecified: Secondary | ICD-10-CM | POA: Diagnosis not present

## 2017-02-20 DIAGNOSIS — Z853 Personal history of malignant neoplasm of breast: Secondary | ICD-10-CM | POA: Diagnosis not present

## 2017-02-20 DIAGNOSIS — I10 Essential (primary) hypertension: Secondary | ICD-10-CM | POA: Diagnosis not present

## 2017-02-20 DIAGNOSIS — I638 Other cerebral infarction: Secondary | ICD-10-CM | POA: Diagnosis not present

## 2017-02-20 DIAGNOSIS — C4491 Basal cell carcinoma of skin, unspecified: Secondary | ICD-10-CM | POA: Diagnosis not present

## 2017-02-20 DIAGNOSIS — M810 Age-related osteoporosis without current pathological fracture: Secondary | ICD-10-CM | POA: Diagnosis not present

## 2017-02-20 DIAGNOSIS — H4089 Other specified glaucoma: Secondary | ICD-10-CM | POA: Diagnosis not present

## 2017-02-20 DIAGNOSIS — Z6821 Body mass index (BMI) 21.0-21.9, adult: Secondary | ICD-10-CM | POA: Diagnosis not present

## 2017-02-20 DIAGNOSIS — I480 Paroxysmal atrial fibrillation: Secondary | ICD-10-CM | POA: Diagnosis not present

## 2017-02-20 DIAGNOSIS — C4362 Malignant melanoma of left upper limb, including shoulder: Secondary | ICD-10-CM | POA: Diagnosis not present

## 2017-03-05 DIAGNOSIS — L0102 Bockhart's impetigo: Secondary | ICD-10-CM | POA: Diagnosis not present

## 2017-03-05 DIAGNOSIS — L821 Other seborrheic keratosis: Secondary | ICD-10-CM | POA: Diagnosis not present

## 2017-03-05 DIAGNOSIS — D1801 Hemangioma of skin and subcutaneous tissue: Secondary | ICD-10-CM | POA: Diagnosis not present

## 2017-03-05 DIAGNOSIS — D692 Other nonthrombocytopenic purpura: Secondary | ICD-10-CM | POA: Diagnosis not present

## 2017-03-05 DIAGNOSIS — Z85820 Personal history of malignant melanoma of skin: Secondary | ICD-10-CM | POA: Diagnosis not present

## 2017-03-05 DIAGNOSIS — L814 Other melanin hyperpigmentation: Secondary | ICD-10-CM | POA: Diagnosis not present

## 2017-03-05 DIAGNOSIS — B0089 Other herpesviral infection: Secondary | ICD-10-CM | POA: Diagnosis not present

## 2017-03-05 DIAGNOSIS — D485 Neoplasm of uncertain behavior of skin: Secondary | ICD-10-CM | POA: Diagnosis not present

## 2017-06-07 ENCOUNTER — Other Ambulatory Visit: Payer: Self-pay | Admitting: Cardiology

## 2017-07-23 DIAGNOSIS — M810 Age-related osteoporosis without current pathological fracture: Secondary | ICD-10-CM | POA: Diagnosis not present

## 2017-08-07 DIAGNOSIS — H401131 Primary open-angle glaucoma, bilateral, mild stage: Secondary | ICD-10-CM | POA: Diagnosis not present

## 2017-09-03 DIAGNOSIS — Z85820 Personal history of malignant melanoma of skin: Secondary | ICD-10-CM | POA: Diagnosis not present

## 2017-09-03 DIAGNOSIS — L821 Other seborrheic keratosis: Secondary | ICD-10-CM | POA: Diagnosis not present

## 2017-09-03 DIAGNOSIS — L814 Other melanin hyperpigmentation: Secondary | ICD-10-CM | POA: Diagnosis not present

## 2017-09-03 DIAGNOSIS — D225 Melanocytic nevi of trunk: Secondary | ICD-10-CM | POA: Diagnosis not present

## 2017-09-03 DIAGNOSIS — L853 Xerosis cutis: Secondary | ICD-10-CM | POA: Diagnosis not present

## 2017-09-03 DIAGNOSIS — B0089 Other herpesviral infection: Secondary | ICD-10-CM | POA: Diagnosis not present

## 2017-09-03 DIAGNOSIS — D1801 Hemangioma of skin and subcutaneous tissue: Secondary | ICD-10-CM | POA: Diagnosis not present

## 2017-12-04 ENCOUNTER — Other Ambulatory Visit: Payer: Self-pay | Admitting: Cardiology

## 2017-12-04 DIAGNOSIS — Z9229 Personal history of other drug therapy: Secondary | ICD-10-CM

## 2017-12-04 DIAGNOSIS — I119 Hypertensive heart disease without heart failure: Secondary | ICD-10-CM

## 2017-12-04 DIAGNOSIS — I480 Paroxysmal atrial fibrillation: Secondary | ICD-10-CM

## 2017-12-04 DIAGNOSIS — E7849 Other hyperlipidemia: Secondary | ICD-10-CM

## 2017-12-11 ENCOUNTER — Telehealth: Payer: Self-pay | Admitting: *Deleted

## 2017-12-11 DIAGNOSIS — I119 Hypertensive heart disease without heart failure: Secondary | ICD-10-CM

## 2017-12-11 DIAGNOSIS — Z9229 Personal history of other drug therapy: Secondary | ICD-10-CM

## 2017-12-11 DIAGNOSIS — I480 Paroxysmal atrial fibrillation: Secondary | ICD-10-CM

## 2017-12-11 DIAGNOSIS — E7849 Other hyperlipidemia: Secondary | ICD-10-CM

## 2017-12-11 MED ORDER — METOPROLOL SUCCINATE ER 25 MG PO TB24: 25.0000 mg | ORAL_TABLET | Freq: Two times a day (BID) | ORAL | 0 refills | Status: DC

## 2017-12-11 NOTE — Addendum Note (Signed)
Addended by: Nuala Alpha on: 12/11/2017 11:33 AM   Modules accepted: Orders

## 2017-12-11 NOTE — Telephone Encounter (Signed)
Spoke with the pt and confirmed with her that she is taking long acting Toprol XL 25 mg po bid, vs tartrate, for there was no documentation in her chart as to why she is taking long acting bid vs once daily.  Pt states her previous Cardiologist in [place], switched her regimen form metoprolol tartrate 25 mg po bid, to metoprolol succinate 25 mg po bid.  Pt states that Dr Delight Ovens for her to take the succinate bid, at her last OV a year ago.  Pt states that whomever last refilled this med at our office, did call it in for brand and not generic, and she prefers generic, for this causes her no complications, and is covered by her insurance plan.  Informed the pt that given she is taking Toprol XL 25 mg po bid, and tolerates this well, I will send in a 90 day supply of this med, but switch it to generic metoprolol succinate 25 mg po bid instead, for insurance coverage.  Pt verbalized understanding and agrees with this plan.  Pt gracious for all the assistance provided.  Pt will see Dr [name] for routine follow-up on 01/08/18.

## 2017-12-11 NOTE — Telephone Encounter (Signed)
Patient initiated call requesting prior authorization for TOPROL XL, I called [employer] and gave them all clinical information, they denied the toprol xl stating their formulary has changed and patient must try generic metaproplol which needs no prior authorization. I reviewed provider notes and allergies and could not see why patient couldn't take metoprolol, I called patient and she states she had not problems with taking the generic before. I will notify Dr [name] and Winifred Olive, LPN.

## 2017-12-13 ENCOUNTER — Other Ambulatory Visit: Payer: Self-pay | Admitting: Cardiology

## 2017-12-15 ENCOUNTER — Other Ambulatory Visit: Payer: Self-pay | Admitting: Cardiology

## 2017-12-15 DIAGNOSIS — Z9229 Personal history of other drug therapy: Secondary | ICD-10-CM

## 2017-12-15 DIAGNOSIS — I480 Paroxysmal atrial fibrillation: Secondary | ICD-10-CM

## 2017-12-15 DIAGNOSIS — E7849 Other hyperlipidemia: Secondary | ICD-10-CM

## 2017-12-15 DIAGNOSIS — I119 Hypertensive heart disease without heart failure: Secondary | ICD-10-CM

## 2017-12-15 MED ORDER — METOPROLOL SUCCINATE ER 25 MG PO TB24: 25.0000 mg | ORAL_TABLET | Freq: Two times a day (BID) | ORAL | 0 refills | Status: DC

## 2017-12-15 NOTE — Telephone Encounter (Signed)
Pt calling requesting a 10 day supply be sent to [facility] until mail order arrives. Confirmation received.

## 2017-12-15 NOTE — Telephone Encounter (Signed)
Eliquis 25m refill request received, pt is 77yrs old, wt-62.6kg, lst Crea-0.66 on 12/05/16-labs needed, last seen by Dr. NMeda Coffeeon 12/04/16 & due to f/u on 01/13/18; will send in refill to requested pharmacy until pt has next appt with Dr. NMeda Coffee& placed a note on pt's chart to obtain CBCB & BMET at next appt.

## 2018-01-12 ENCOUNTER — Other Ambulatory Visit: Payer: Self-pay | Admitting: Internal Medicine

## 2018-01-12 DIAGNOSIS — Z139 Encounter for screening, unspecified: Secondary | ICD-10-CM

## 2018-01-13 ENCOUNTER — Ambulatory Visit (INDEPENDENT_AMBULATORY_CARE_PROVIDER_SITE_OTHER): Payer: Medicare Other | Admitting: Cardiology

## 2018-01-13 ENCOUNTER — Encounter: Payer: Self-pay | Admitting: Cardiology

## 2018-01-13 VITALS — BP 108/74 | HR 75 | Ht 65.0 in | Wt 143.4 lb

## 2018-01-13 DIAGNOSIS — E7849 Other hyperlipidemia: Secondary | ICD-10-CM

## 2018-01-13 DIAGNOSIS — I119 Hypertensive heart disease without heart failure: Secondary | ICD-10-CM | POA: Diagnosis not present

## 2018-01-13 DIAGNOSIS — I480 Paroxysmal atrial fibrillation: Secondary | ICD-10-CM | POA: Diagnosis not present

## 2018-01-13 MED ORDER — APIXABAN 5 MG PO TABS: 5.0000 mg | ORAL_TABLET | Freq: Two times a day (BID) | ORAL | 3 refills | Status: DC

## 2018-01-13 NOTE — Patient Instructions (Signed)

## 2018-01-13 NOTE — Progress Notes (Signed)
Patient ID: Angela Carpenter, female   DOB: 1941/08/22, 77 y.o.   MRN: [id number]      Cardiology Office Note  Date:  01/13/2018   ID:  Angela Carpenter, DOB [birth year], MRN [id number]  PCP:  Haywood Pao, MD  Cardiologist:   [name], MD   Chief complain:  1 year follow up   History of Present Illness: Angela Carpenter is a 77 y.o. female with h/o hypertension, hyperlipidemia, s/p CVA in 11/2014, s/p Linq implantation and removal, diagnosis of paroxysmal atrial fibrillation and initiation of warfarin. She is very active walking 2 miles /day without any symptoms. No bleeding with warfarin. No palpitations or syncope.  No CP or DOE.  Her echocardiogram showed LVEF 60-65%, normal RVEF, normal LA size, mild MR, otherwise normal echo. On ECG in 06/2015 she was in [place]. She is tolerating atorvastatin well.   12/03/2016 -1 year follow-up, the patient feels and looks great, she goes to gym at least 4 times a week and has lost 6 pounds since last year. She's been compliant with her meds and denies any bleeding or muscle pain. She has no chest pain or shortness of breath no palpitation claudication or syncope. She is going through major stress as her husband has progressive Alzheimer complicated by depression and doesn't leak house refuses to do anything.  01/13/2018 - this is a 1 year follow-up, the patient is doing great, She denies any chest pain, shortness of breath,  palpitations, no bleeding. 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 She is trying to find ways to decompress with exercise and support groups.  Past Medical History:  Diagnosis Date  . A-fib ([place])   . Breast cancer ([place]) 2006  . Colon polyps   . Glaucoma   . Hypercholesteremia   . Hypertension   . Osteoporosis   . Personal history of chemotherapy 2006  . Personal history of radiation therapy 2006  . Stroke (cerebrum) ([place]) 2015   Past Surgical History:  Procedure Laterality Date    . AUGMENTATION MAMMAPLASTY Bilateral    Patient had them removed in 2008  . BREAST BIOPSY Left 2016  . BREAST LUMPECTOMY Right 2006  . TONSILLECTOMY     Current Outpatient Medications  Medication Sig Dispense Refill  . alendronate (FOSAMAX) 70 MG tablet Take 70 mg by mouth once a week. Take with a full glass of water on an empty stomach.    Marland Kitchen apixaban (ELIQUIS) 5 MG TABS tablet Take 1 tablet (5 mg total) by mouth 2 (two) times daily. 180 tablet 3  . atorvastatin (LIPITOR) 10 MG tablet Take 1 tablet (10 mg total) by mouth daily. 90 tablet 3  . Cholecalciferol (VITAMIN D-3) 1000 UNITS CAPS Take 1 capsule by mouth daily.    . metoprolol succinate (TOPROL XL) 25 MG 24 hr tablet Take 1 tablet (25 mg total) by mouth 2 (two) times daily. 20 tablet 0  . raloxifene (EVISTA) 60 MG tablet Take 60 mg by mouth daily.    . timolol (TIMOPTIC) 0.5 % ophthalmic solution Place 1 drop into both eyes 2 (two) times daily.     Current Facility-Administered Medications  Medication Dose Route Frequency Provider Last Rate Last Dose  . 0.9 %  sodium chloride infusion  500 mL Intravenous Continuous Danis, Kirke Corin, MD       Allergies:   Patient has no known allergies.   Social History:  The patient  reports that she  is a non-smoker but has been exposed to tobacco smoke. She has never used smokeless tobacco. She reports that she does not drink alcohol or use drugs.   Family History:  The patient's family history includes Breast cancer in her paternal aunt and paternal grandmother; Cirrhosis in her maternal grandfather and paternal grandfather; Hypertension in her sister; Hypotension in her mother; Kidney cancer in her brother; Lung cancer in her mother; Pancreatic cancer in her father.   ROS:  Please see the history of present illness.   Otherwise, review of systems are positive for none.   All other systems are reviewed and negative.   PHYSICAL EXAM: VS:  BP 108/74   Pulse 75   Ht 5\' 5"  (1.651 m)   Wt 143  lb 6.4 oz (65 kg)   BMI 23.86 kg/m  , BMI Body mass index is 23.86 kg/m. GEN: Well nourished, well developed, in no acute distress  HEENT: normal  Neck: no JVD, carotid bruits, or masses Cardiac: RRR; no murmurs, rubs, or gallops,no edema  Respiratory:  clear to auscultation bilaterally, normal work of breathing GI: soft, nontender, nondistended, + BS MS: no deformity or atrophy  Skin: warm and dry, no rash Neuro:  Strength and sensation are intact Psych: euthymic mood, full affect  Recent Labs: No results found for requested labs within last 8760 hours.   Lipid Panel    Component Value Date/Time   CHOL 151 12/05/2016 0750   TRIG 69 12/05/2016 0750   HDL 72 12/05/2016 0750   CHOLHDL 2.1 12/05/2016 0750   CHOLHDL 1.8 09/05/2015 0828   VLDL 18 09/05/2015 0828   LDLCALC 65 12/05/2016 0750   Wt Readings from Last 3 Encounters:  01/13/18 143 lb 6.4 oz (65 kg)  12/04/16 138 lb (62.6 kg)  08/14/16 139 lb (63 kg)    ECG: Personally reviewed, showed normal sinus rhythm normal EKG unchanged from prior.   ASSESSMENT AND PLAN:  1. Paroxysmal atrial fibrillation - on chronic anticoagulation, this is non-valvular a-fib, tolerates Eliquis, no bleeding,  Minimal palpitations that are not associated with other symptoms. TSH normal. She is not aware of when she has atrial fibrillation however she didn't feel any palpitations since last year.  2. Hypertension - controlled.  3. Hyperlipidemia - on atorvastatin 20 mg po daily, her labs in 12/2016 showed excellent lipid, normal LFTs.  Follow up in 1 year.   Signed, [name], MD  01/13/2018 6:02 PM    [facility] [place], [place], [place]  [address] Phone: [phone]; Fax: [phone]

## 2018-02-06 DIAGNOSIS — H524 Presbyopia: Secondary | ICD-10-CM | POA: Diagnosis not present

## 2018-02-06 DIAGNOSIS — H401111 Primary open-angle glaucoma, right eye, mild stage: Secondary | ICD-10-CM | POA: Diagnosis not present

## 2018-02-06 DIAGNOSIS — H401131 Primary open-angle glaucoma, bilateral, mild stage: Secondary | ICD-10-CM | POA: Diagnosis not present

## 2018-02-16 ENCOUNTER — Ambulatory Visit
Admission: RE | Admit: 2018-02-16 | Discharge: 2018-02-16 | Disposition: A | Discharge status: Home / Self Care | Payer: Medicare Other | Source: Ambulatory Visit | Attending: Internal Medicine | Admitting: Internal Medicine

## 2018-02-16 DIAGNOSIS — Z139 Encounter for screening, unspecified: Secondary | ICD-10-CM

## 2018-02-16 DIAGNOSIS — Z1231 Encounter for screening mammogram for malignant neoplasm of breast: Secondary | ICD-10-CM | POA: Diagnosis not present

## 2018-02-24 DIAGNOSIS — M810 Age-related osteoporosis without current pathological fracture: Secondary | ICD-10-CM | POA: Diagnosis not present

## 2018-02-24 DIAGNOSIS — E7800 Pure hypercholesterolemia, unspecified: Secondary | ICD-10-CM | POA: Diagnosis not present

## 2018-02-24 DIAGNOSIS — I10 Essential (primary) hypertension: Secondary | ICD-10-CM | POA: Diagnosis not present

## 2018-03-03 DIAGNOSIS — H4089 Other specified glaucoma: Secondary | ICD-10-CM | POA: Diagnosis not present

## 2018-03-03 DIAGNOSIS — I6789 Other cerebrovascular disease: Secondary | ICD-10-CM | POA: Diagnosis not present

## 2018-03-03 DIAGNOSIS — Z6822 Body mass index (BMI) 22.0-22.9, adult: Secondary | ICD-10-CM | POA: Diagnosis not present

## 2018-03-03 DIAGNOSIS — Z7689 Persons encountering health services in other specified circumstances: Secondary | ICD-10-CM | POA: Diagnosis not present

## 2018-03-03 DIAGNOSIS — Z853 Personal history of malignant neoplasm of breast: Secondary | ICD-10-CM | POA: Diagnosis not present

## 2018-03-03 DIAGNOSIS — D126 Benign neoplasm of colon, unspecified: Secondary | ICD-10-CM | POA: Diagnosis not present

## 2018-03-03 DIAGNOSIS — I10 Essential (primary) hypertension: Secondary | ICD-10-CM | POA: Diagnosis not present

## 2018-03-03 DIAGNOSIS — Z0000 Encounter for general adult medical examination without abnormal findings: Secondary | ICD-10-CM | POA: Diagnosis not present

## 2018-03-03 DIAGNOSIS — I480 Paroxysmal atrial fibrillation: Secondary | ICD-10-CM | POA: Diagnosis not present

## 2018-03-03 DIAGNOSIS — M810 Age-related osteoporosis without current pathological fracture: Secondary | ICD-10-CM | POA: Diagnosis not present

## 2018-03-03 DIAGNOSIS — Z1389 Encounter for screening for other disorder: Secondary | ICD-10-CM | POA: Diagnosis not present

## 2018-03-03 DIAGNOSIS — E7800 Pure hypercholesterolemia, unspecified: Secondary | ICD-10-CM | POA: Diagnosis not present

## 2018-03-07 ENCOUNTER — Other Ambulatory Visit: Payer: Self-pay | Admitting: Cardiology

## 2018-03-07 DIAGNOSIS — I119 Hypertensive heart disease without heart failure: Secondary | ICD-10-CM

## 2018-03-07 DIAGNOSIS — I480 Paroxysmal atrial fibrillation: Secondary | ICD-10-CM

## 2018-03-07 DIAGNOSIS — E7849 Other hyperlipidemia: Secondary | ICD-10-CM

## 2018-03-07 DIAGNOSIS — Z9229 Personal history of other drug therapy: Secondary | ICD-10-CM

## 2018-03-21 ENCOUNTER — Other Ambulatory Visit: Payer: Self-pay | Admitting: Cardiology

## 2018-03-21 DIAGNOSIS — E7849 Other hyperlipidemia: Secondary | ICD-10-CM

## 2018-03-21 DIAGNOSIS — Z9229 Personal history of other drug therapy: Secondary | ICD-10-CM

## 2018-03-21 DIAGNOSIS — I119 Hypertensive heart disease without heart failure: Secondary | ICD-10-CM

## 2018-03-21 DIAGNOSIS — I480 Paroxysmal atrial fibrillation: Secondary | ICD-10-CM

## 2018-06-13 DIAGNOSIS — Z23 Encounter for immunization: Secondary | ICD-10-CM | POA: Diagnosis not present

## 2018-08-10 DIAGNOSIS — H401131 Primary open-angle glaucoma, bilateral, mild stage: Secondary | ICD-10-CM | POA: Diagnosis not present

## 2018-08-26 DIAGNOSIS — D485 Neoplasm of uncertain behavior of skin: Secondary | ICD-10-CM | POA: Diagnosis not present

## 2018-08-26 DIAGNOSIS — D1801 Hemangioma of skin and subcutaneous tissue: Secondary | ICD-10-CM | POA: Diagnosis not present

## 2018-08-26 DIAGNOSIS — D2372 Other benign neoplasm of skin of left lower limb, including hip: Secondary | ICD-10-CM | POA: Diagnosis not present

## 2018-08-26 DIAGNOSIS — L820 Inflamed seborrheic keratosis: Secondary | ICD-10-CM | POA: Diagnosis not present

## 2018-08-26 DIAGNOSIS — D225 Melanocytic nevi of trunk: Secondary | ICD-10-CM | POA: Diagnosis not present

## 2018-08-26 DIAGNOSIS — L570 Actinic keratosis: Secondary | ICD-10-CM | POA: Diagnosis not present

## 2018-08-26 DIAGNOSIS — L814 Other melanin hyperpigmentation: Secondary | ICD-10-CM | POA: Diagnosis not present

## 2018-08-26 DIAGNOSIS — B0089 Other herpesviral infection: Secondary | ICD-10-CM | POA: Diagnosis not present

## 2018-08-26 DIAGNOSIS — L821 Other seborrheic keratosis: Secondary | ICD-10-CM | POA: Diagnosis not present

## 2018-08-31 DIAGNOSIS — H4089 Other specified glaucoma: Secondary | ICD-10-CM | POA: Diagnosis not present

## 2018-08-31 DIAGNOSIS — Z853 Personal history of malignant neoplasm of breast: Secondary | ICD-10-CM | POA: Diagnosis not present

## 2018-08-31 DIAGNOSIS — E7800 Pure hypercholesterolemia, unspecified: Secondary | ICD-10-CM | POA: Diagnosis not present

## 2018-08-31 DIAGNOSIS — I480 Paroxysmal atrial fibrillation: Secondary | ICD-10-CM | POA: Diagnosis not present

## 2018-08-31 DIAGNOSIS — I10 Essential (primary) hypertension: Secondary | ICD-10-CM | POA: Diagnosis not present

## 2018-08-31 DIAGNOSIS — I6789 Other cerebrovascular disease: Secondary | ICD-10-CM | POA: Diagnosis not present

## 2018-08-31 DIAGNOSIS — Z7901 Long term (current) use of anticoagulants: Secondary | ICD-10-CM | POA: Diagnosis not present

## 2018-08-31 DIAGNOSIS — Z6822 Body mass index (BMI) 22.0-22.9, adult: Secondary | ICD-10-CM | POA: Diagnosis not present

## 2018-08-31 DIAGNOSIS — M810 Age-related osteoporosis without current pathological fracture: Secondary | ICD-10-CM | POA: Diagnosis not present

## 2018-12-03 DIAGNOSIS — R82998 Other abnormal findings in urine: Secondary | ICD-10-CM | POA: Diagnosis not present

## 2018-12-03 DIAGNOSIS — N390 Urinary tract infection, site not specified: Secondary | ICD-10-CM | POA: Diagnosis not present

## 2018-12-18 ENCOUNTER — Encounter: Payer: Self-pay | Admitting: Cardiology

## 2018-12-30 ENCOUNTER — Ambulatory Visit: Payer: Medicare Other | Admitting: Cardiology

## 2019-01-07 ENCOUNTER — Other Ambulatory Visit: Payer: Self-pay | Admitting: Internal Medicine

## 2019-01-07 DIAGNOSIS — Z1231 Encounter for screening mammogram for malignant neoplasm of breast: Secondary | ICD-10-CM

## 2019-01-14 ENCOUNTER — Telehealth: Payer: Self-pay

## 2019-01-14 NOTE — Telephone Encounter (Signed)
lpmtcb 4/9

## 2019-01-14 NOTE — Telephone Encounter (Signed)

## 2019-01-25 NOTE — Progress Notes (Signed)
Virtual Visit via Video Note   This visit type was conducted due to national recommendations for restrictions regarding the COVID-19 Pandemic (e.g. social distancing) in an effort to limit this patient's exposure and mitigate transmission in our community.  Due to her co-morbid illnesses, this patient is at least at moderate risk for complications without adequate follow up.  This format is felt to be most appropriate for this patient at this time.  All issues noted in this document were discussed and addressed.  A limited physical exam was performed with this format.  Please refer to the patient's chart for her consent to telehealth for [facility].   Platform used: Doximity   Evaluation Performed:  Follow-up visit  Date:  01/26/2019   ID:  Angela Carpenter, DOB 26-Mar-1941, MRN [id number]  Patient Location: Home Provider Location: Home  PCP:  Tisovec, [name], MD  Cardiologist:  [name], MD  Electrophysiologist:  None   Chief Complaint:  F/u for Atrial Fibrillation and HTN  History of Present Illness:    Angela Carpenter is a 78 y.o. female with h/o hypertension, hyperlipidemia and CVA in 11/2014. She underwent Linq implantation and removal, diagnosis of paroxysmal atrial fibrillation was made and she was started on warfarin for secondary prevention but later changed to Eliquis. Transesophageal Echocardiogram in 2016 at time of CVA showed LVEF 60-65%, normal RVEF, normal LA size, mild MR, otherwise normal echo. On ECG in 06/2015 she was in [place]. She is on atorvastatin for HLD.  Patient was last seen by Dr. [name] in April 2019 and was doing well from a cardiac standpoint.  She was instructed to follow-up in 1 year for repeat evaluation.  Today in follow-up, the patient reports that she has done well from a cardiac symptom standpoint.  She has not had any symptoms, denying chest pain or pressure, dyspnea, palpitations, lightheadedness, dizziness, syncope/near syncope, lower extremity  edema, orthopnea and PND.  She has been fully compliant with her medications.  She denies any abnormal bleeding with Eliquis.  No falls.  She walks daily for exercise.  Her neighborhood has lots of hills, good workout for her.  She avoids triggers that can increase heart rate.  She does drink 1 cup of coffee daily but no other caffeine use. Avoids NSAIDS.   BP is well controlled at 117/69. Pulse rate is 64 by home BP monitor. She is a little concerned because she reported that she checked her blood pressure yesterday evening and systolic blood pressure was in the 150s.  However she felt fine.  She will continue to monitor her blood pressure in the evenings and will let us know if any persistent hypertension.  The patient does not have symptoms concerning for COVID-19 infection (fever, chills, cough, or new shortness of breath).   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  She has been unable to visit him recently due to COVID-19 restrictions.   Past Medical History:  Diagnosis Date  . A-fib ([place])   . Breast cancer ([place]) 2006  . Colon polyps   . Glaucoma   . Hypercholesteremia   . Hypertension   . Osteoporosis   . Personal history of chemotherapy 2006  . Personal history of radiation therapy 2006  . Stroke (cerebrum) ([place]) 2015   Past Surgical History:  Procedure Laterality Date  . AUGMENTATION MAMMAPLASTY Bilateral    Patient had them removed in 2008  . BREAST BIOPSY Left  2016  . BREAST LUMPECTOMY Right 2006  . TONSILLECTOMY       Current Meds  Medication Sig  . alendronate (FOSAMAX) 70 MG tablet Take 70 mg by mouth once a week. Take with a full glass of water on an empty stomach.  Marland Kitchen apixaban (ELIQUIS) 5 MG TABS tablet Take 1 tablet (5 mg total) by mouth 2 (two) times daily.  Marland Kitchen atorvastatin (LIPITOR) 10 MG tablet Take 1 tablet (10 mg total) by mouth daily.  . Cholecalciferol (VITAMIN D-3) 1000 UNITS CAPS Take 1 capsule  by mouth daily.  . metoprolol succinate (TOPROL XL) 25 MG 24 hr tablet Take 1 tablet (25 mg total) by mouth 2 (two) times daily.  . raloxifene (EVISTA) 60 MG tablet Take 60 mg by mouth daily.  . timolol (TIMOPTIC) 0.5 % ophthalmic solution Place 1 drop into both eyes 2 (two) times daily.   Current Facility-Administered Medications for the 01/26/19 encounter (Telemedicine) with Consuelo Pandy, PA-C  Medication  . 0.9 %  sodium chloride infusion     Allergies:   Patient has no known allergies.   Social History   Tobacco Use  . Smoking status: Passive Smoke Exposure - Never Smoker  . Smokeless tobacco: Never Used  . Tobacco comment: smoked in college but didn't inhale  Substance Use Topics  . Alcohol use: No    Alcohol/week: 0.0 standard drinks  . Drug use: No     Family Hx: The patient's family history includes Breast cancer in her paternal aunt and paternal grandmother; Cirrhosis in her maternal grandfather and paternal grandfather; Hypertension in her sister; Hypotension in her mother; Kidney cancer in her brother; Lung cancer in her mother; Pancreatic cancer in her father.  ROS:   Please see the history of present illness.     All other systems reviewed and are negative.   Prior CV studies:   The following studies were reviewed today:  TEE 2016: LVEF 60-65%, normal RVEF, normal LA size, mild MR, otherwise normal echo.  Labs/Other Tests and Data Reviewed:    EKG:  An ECG dated 01/13/18 was personally reviewed today and demonstrated:  NSR 75 bpm  Recent Labs: No results found for requested labs within last 8760 hours.   Recent Lipid Panel Lab Results  Component Value Date/Time   CHOL 151 12/05/2016 07:50 AM   TRIG 69 12/05/2016 07:50 AM   HDL 72 12/05/2016 07:50 AM   CHOLHDL 2.1 12/05/2016 07:50 AM   CHOLHDL 1.8 09/05/2015 08:28 AM   LDLCALC 65 12/05/2016 07:50 AM    Wt Readings from Last 3 Encounters:  01/26/19 137 lb (62.1 kg)  01/13/18 143 lb 6.4 oz (65  kg)  12/04/16 138 lb (62.6 kg)     Objective:    Vital Signs:  BP 117/69   Pulse 64   Ht 5\' 5"  (1.651 m)   Wt 137 lb (62.1 kg)   BMI 22.80 kg/m    VITAL SIGNS:  reviewed GEN:  no acute distress EYES:  sclerae anicteric, EOMI - Extraocular Movements Intact RESPIRATORY:  normal respiratory effort, symmetric expansion CARDIOVASCULAR:  N/A SKIN:  no rash, lesions or ulcers. MUSCULOSKELETAL:  no obvious deformities. NEURO:  alert and oriented x 3, no obvious focal deficit PSYCH:  normal affect  ASSESSMENT & PLAN:    1. Atrial Fibrillation: detected by implantable loop after CVA.  Patient was in normal sinus rhythm at time of last EKG April 2019.  She denies any symptoms of breakthrough atrial fibrillation.  She is on metoprolol for rate control and pulse rate is 64 bpm this morning. She is on Eliquis for anticoagulation for secondary prevention of CVA.  2.  HTN: well controlled on current medication. 117/69. She is a little concerned because she reported that she checked her blood pressure yesterday evening and systolic blood pressure was in the 150s.  However she felt fine.  She will continue to monitor her blood pressure in the evenings and will let us know if any persistent hypertension.  She is on Toprol XL but she takes this twice daily.  3. HLD: on statin therapy w/ atorvastatin.  Lipid panel was followed by PCP.  She has follow-up with PCP next month and will get labs  4. H/o Stroke: implantable loop after CVA detected Afib.  Now on Eliquis for secondary prevention.   5.  Chronic anticoagulation therapy: Patient on Eliquis for atrial fibrillation and secondary prevention given prior history of CVA.  She reports full medication compliance.  She denies any abnormal bleeding, including no hematuria, melena or hematochezia.  She also denies falls.  She avoids NSAIDs.   COVID-19 Education: The signs and symptoms of COVID-19 were discussed with the patient and how to seek care for  testing (follow up with PCP or arrange E-visit).  The importance of social distancing was discussed today.  Time:   Today, I have spent 15 minutes with the patient with telehealth technology discussing the above problems.     Medication Adjustments/Labs and Tests Ordered: Current medicines are reviewed at length with the patient today.  Concerns regarding medicines are outlined above.   Tests Ordered: No orders of the defined types were placed in this encounter.   Medication Changes: No orders of the defined types were placed in this encounter.   Disposition:  Follow up In 1 year with Dr. [name]  Signed, Lyda Jester, PA-C  01/26/2019 9:39 AM    [place]

## 2019-01-26 ENCOUNTER — Encounter: Payer: Self-pay | Admitting: Cardiology

## 2019-01-26 ENCOUNTER — Other Ambulatory Visit: Payer: Self-pay

## 2019-01-26 ENCOUNTER — Telehealth (INDEPENDENT_AMBULATORY_CARE_PROVIDER_SITE_OTHER): Payer: Medicare Other | Admitting: Cardiology

## 2019-01-26 VITALS — BP 117/69 | HR 64 | Ht 65.0 in | Wt 137.0 lb

## 2019-01-26 DIAGNOSIS — Z8673 Personal history of transient ischemic attack (TIA), and cerebral infarction without residual deficits: Secondary | ICD-10-CM

## 2019-01-26 DIAGNOSIS — I480 Paroxysmal atrial fibrillation: Secondary | ICD-10-CM

## 2019-01-26 DIAGNOSIS — I119 Hypertensive heart disease without heart failure: Secondary | ICD-10-CM

## 2019-01-26 DIAGNOSIS — Z7901 Long term (current) use of anticoagulants: Secondary | ICD-10-CM

## 2019-01-26 DIAGNOSIS — I10 Essential (primary) hypertension: Secondary | ICD-10-CM

## 2019-01-26 DIAGNOSIS — E7849 Other hyperlipidemia: Secondary | ICD-10-CM

## 2019-01-26 DIAGNOSIS — I4891 Unspecified atrial fibrillation: Secondary | ICD-10-CM | POA: Diagnosis not present

## 2019-01-26 DIAGNOSIS — Z9229 Personal history of other drug therapy: Secondary | ICD-10-CM

## 2019-01-26 NOTE — Patient Instructions (Signed)
Medication Instructions:  none If you need a refill on your cardiac medications before your next appointment, please call your pharmacy.   Lab work: none If you have labs (blood work) drawn today and your tests are completely normal, you will receive your results only by: Marland Kitchen MyChart Message (if you have MyChart) OR . A paper copy in the mail If you have any lab test that is abnormal or we need to change your treatment, we will call you to review the results.  Testing/Procedures: none  Follow-Up: 1 year with Dr [name] At [facility], you and your health needs are our priority.  As part of our continuing mission to provide you with exceptional heart care, we have created designated Provider Care Teams.  These Care Teams include your primary Cardiologist (physician) and Advanced Practice Providers (APPs -  Physician Assistants and Nurse Practitioners) who all work together to provide you with the care you need, when you need it. Any Other Special Instructions Will Be Listed Below (If Applicable).  Please call our office if any cardiac symptoms, Chest Pain, Shortness of Breath, palpitations, dizziness, fainting, swelling in legs or feet, sudden significant weight gain, abnormal bleeding or persistently elevated Blood Pressure greater than 140/80.

## 2019-02-13 ENCOUNTER — Other Ambulatory Visit: Payer: Self-pay | Admitting: Cardiology

## 2019-02-13 DIAGNOSIS — Z9229 Personal history of other drug therapy: Secondary | ICD-10-CM

## 2019-02-13 DIAGNOSIS — I119 Hypertensive heart disease without heart failure: Secondary | ICD-10-CM

## 2019-02-13 DIAGNOSIS — E7849 Other hyperlipidemia: Secondary | ICD-10-CM

## 2019-02-13 DIAGNOSIS — I480 Paroxysmal atrial fibrillation: Secondary | ICD-10-CM

## 2019-02-26 DIAGNOSIS — I10 Essential (primary) hypertension: Secondary | ICD-10-CM | POA: Diagnosis not present

## 2019-02-26 DIAGNOSIS — M810 Age-related osteoporosis without current pathological fracture: Secondary | ICD-10-CM | POA: Diagnosis not present

## 2019-02-26 DIAGNOSIS — E7800 Pure hypercholesterolemia, unspecified: Secondary | ICD-10-CM | POA: Diagnosis not present

## 2019-03-02 DIAGNOSIS — R82998 Other abnormal findings in urine: Secondary | ICD-10-CM | POA: Diagnosis not present

## 2019-03-02 DIAGNOSIS — I10 Essential (primary) hypertension: Secondary | ICD-10-CM | POA: Diagnosis not present

## 2019-03-05 ENCOUNTER — Other Ambulatory Visit: Payer: Self-pay

## 2019-03-05 ENCOUNTER — Ambulatory Visit
Admission: RE | Admit: 2019-03-05 | Discharge: 2019-03-05 | Disposition: A | Discharge status: Home / Self Care | Payer: Medicare Other | Source: Ambulatory Visit | Attending: Internal Medicine | Admitting: Internal Medicine

## 2019-03-05 DIAGNOSIS — I10 Essential (primary) hypertension: Secondary | ICD-10-CM | POA: Diagnosis not present

## 2019-03-05 DIAGNOSIS — Z1331 Encounter for screening for depression: Secondary | ICD-10-CM | POA: Diagnosis not present

## 2019-03-05 DIAGNOSIS — Z1231 Encounter for screening mammogram for malignant neoplasm of breast: Secondary | ICD-10-CM | POA: Diagnosis not present

## 2019-03-05 DIAGNOSIS — I679 Cerebrovascular disease, unspecified: Secondary | ICD-10-CM | POA: Diagnosis not present

## 2019-03-05 DIAGNOSIS — H409 Unspecified glaucoma: Secondary | ICD-10-CM | POA: Diagnosis not present

## 2019-03-05 DIAGNOSIS — N390 Urinary tract infection, site not specified: Secondary | ICD-10-CM | POA: Diagnosis not present

## 2019-03-05 DIAGNOSIS — Z0000 Encounter for general adult medical examination without abnormal findings: Secondary | ICD-10-CM | POA: Diagnosis not present

## 2019-03-05 DIAGNOSIS — Z853 Personal history of malignant neoplasm of breast: Secondary | ICD-10-CM | POA: Diagnosis not present

## 2019-03-05 DIAGNOSIS — I480 Paroxysmal atrial fibrillation: Secondary | ICD-10-CM | POA: Diagnosis not present

## 2019-03-05 DIAGNOSIS — Z1339 Encounter for screening examination for other mental health and behavioral disorders: Secondary | ICD-10-CM | POA: Diagnosis not present

## 2019-03-05 DIAGNOSIS — M810 Age-related osteoporosis without current pathological fracture: Secondary | ICD-10-CM | POA: Diagnosis not present

## 2019-03-05 DIAGNOSIS — E7800 Pure hypercholesterolemia, unspecified: Secondary | ICD-10-CM | POA: Diagnosis not present

## 2019-03-05 DIAGNOSIS — K649 Unspecified hemorrhoids: Secondary | ICD-10-CM | POA: Insufficient documentation

## 2019-03-05 DIAGNOSIS — D126 Benign neoplasm of colon, unspecified: Secondary | ICD-10-CM | POA: Diagnosis not present

## 2019-03-05 DIAGNOSIS — K648 Other hemorrhoids: Secondary | ICD-10-CM | POA: Diagnosis not present

## 2019-03-05 DIAGNOSIS — Z7901 Long term (current) use of anticoagulants: Secondary | ICD-10-CM | POA: Diagnosis not present

## 2019-03-05 IMAGING — MG DIGITAL SCREENING BILATERAL MAMMOGRAM WITH TOMO AND CAD
6 of 12 series · 6 of 36 positions shown · non-contrast
Comparison: Previous exam(s).

CLINICAL DATA: Screening.

EXAM:
DIGITAL SCREENING BILATERAL MAMMOGRAM WITH TOMO AND CAD

[L CC synth-2D]
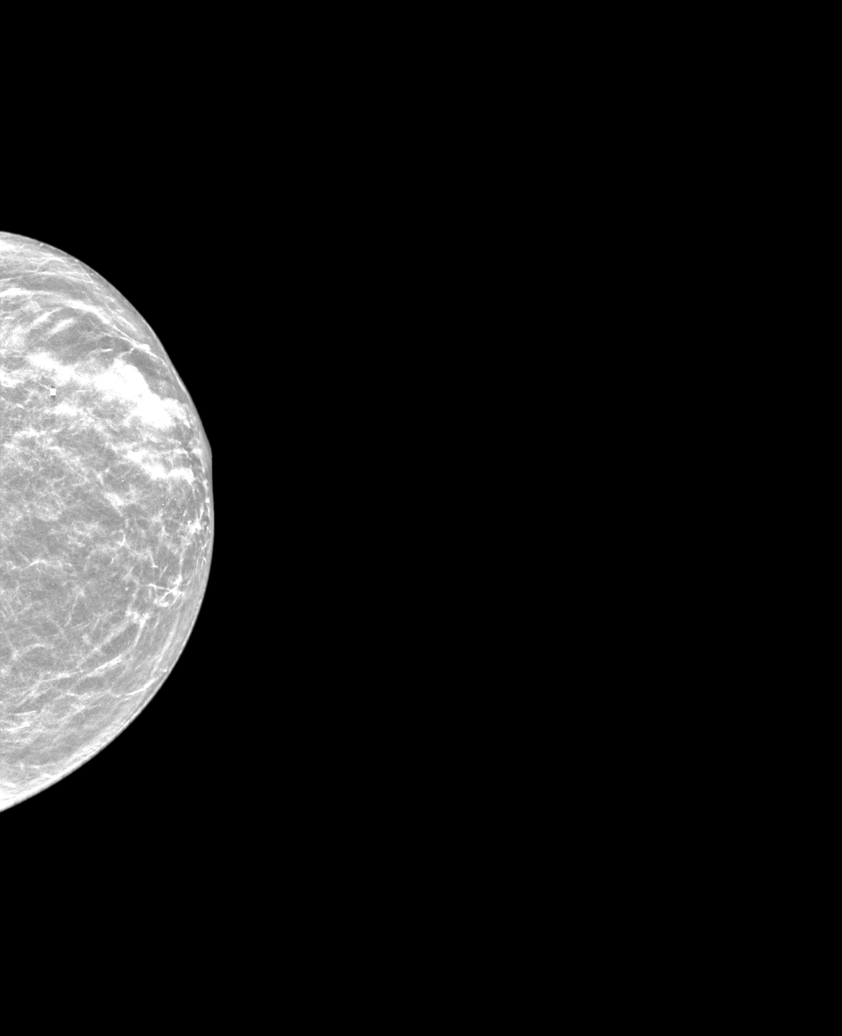

[L MLO synth-2D]
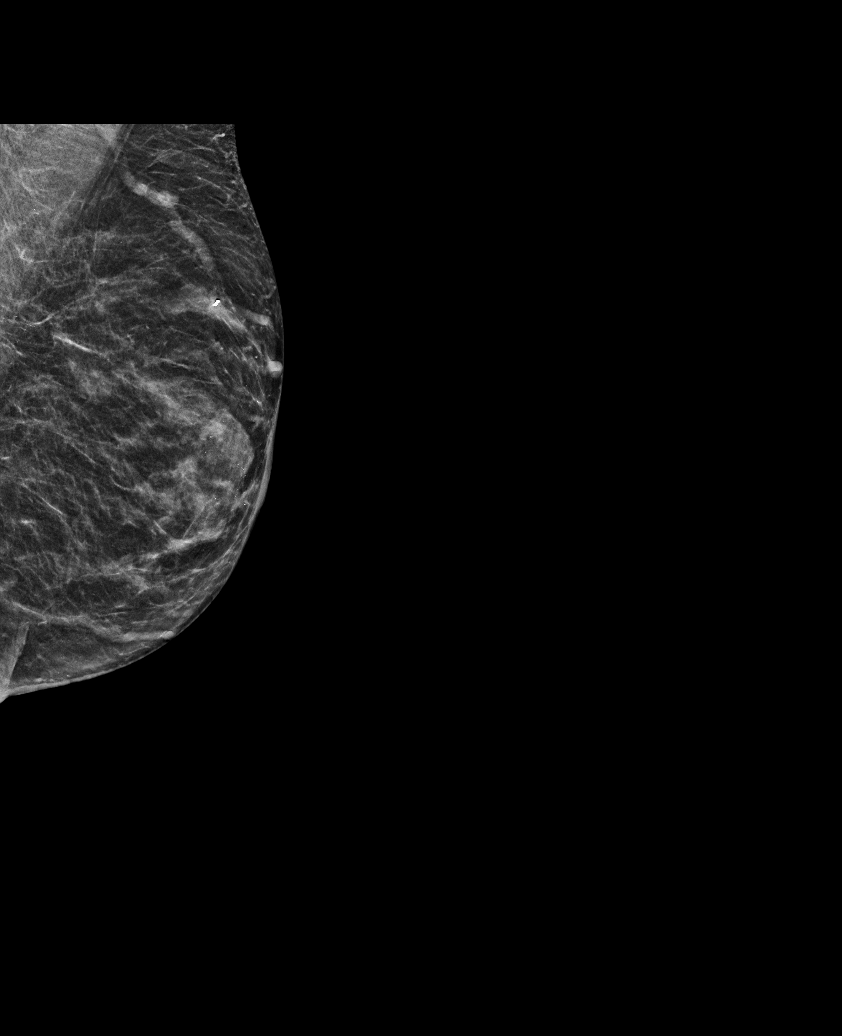

[R MLO synth-2D (1 of 2)]
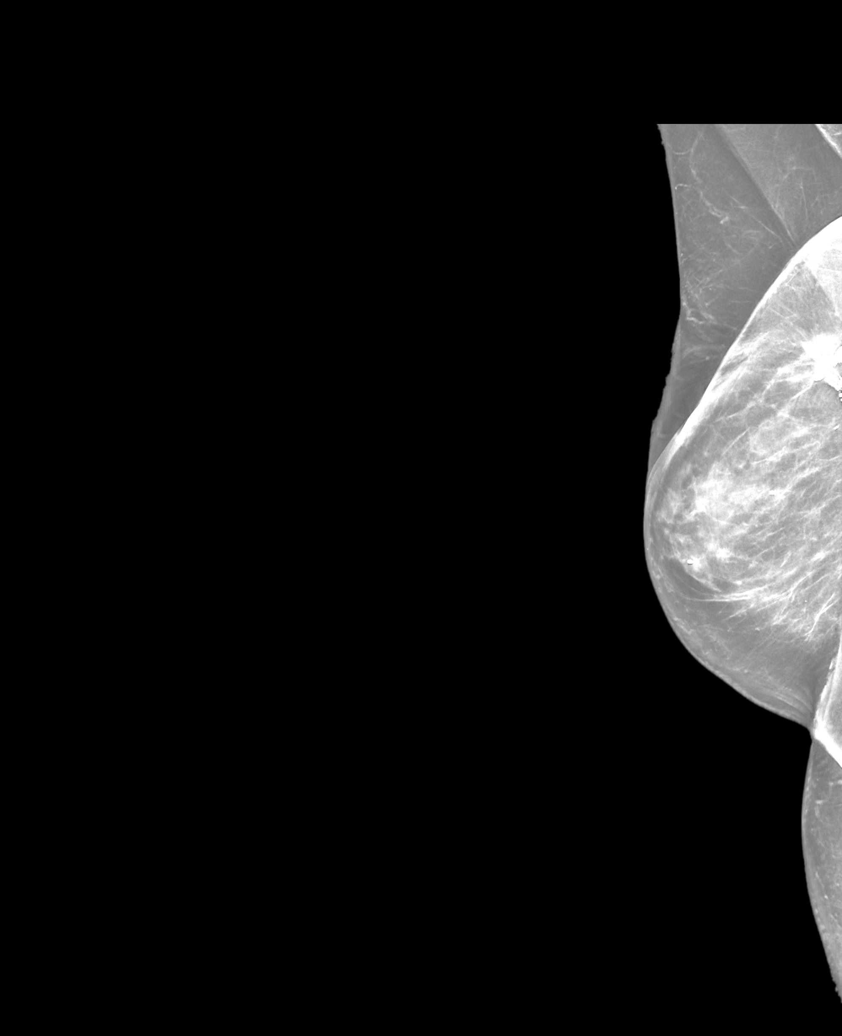

[R CC synth-2D]
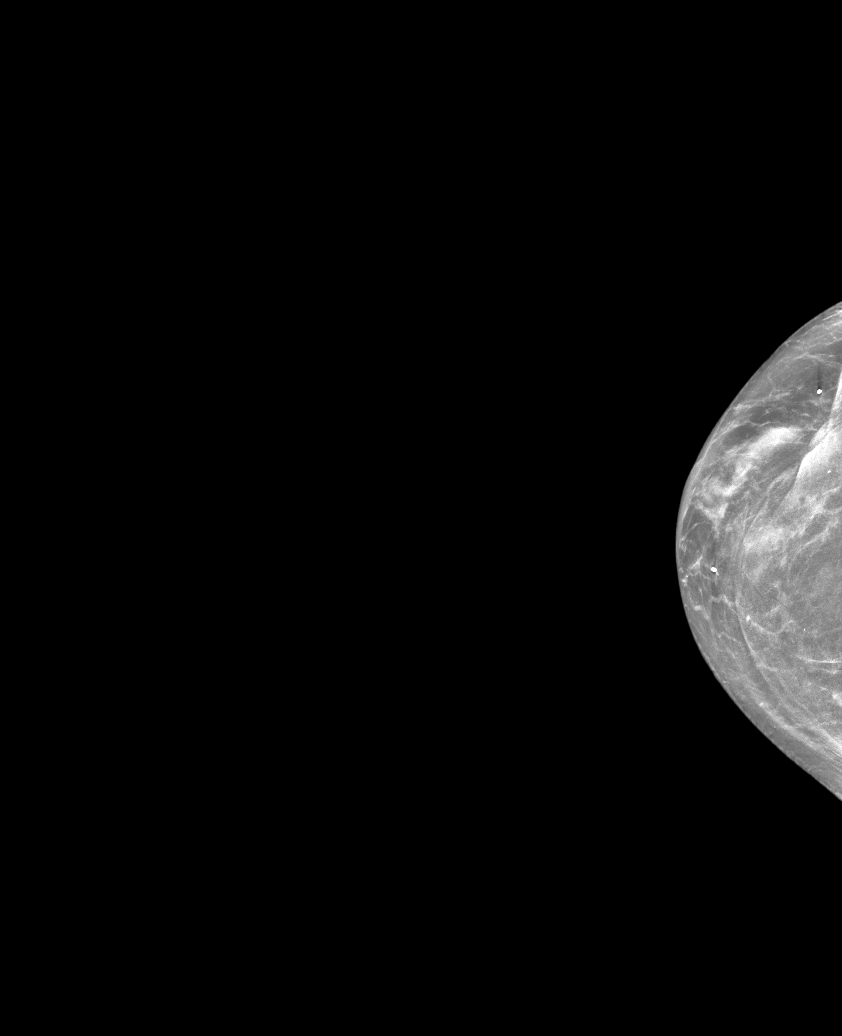

[R MLO synth-2D (2 of 2)]
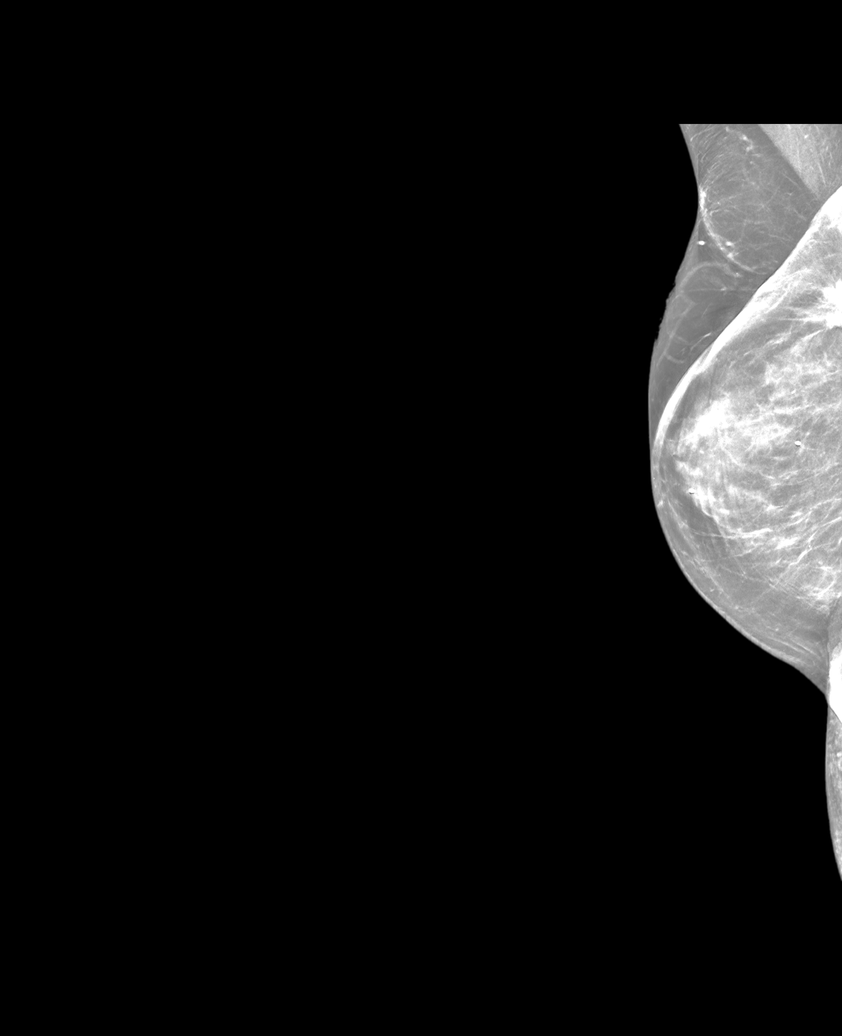

[R XCCL synth-2D]
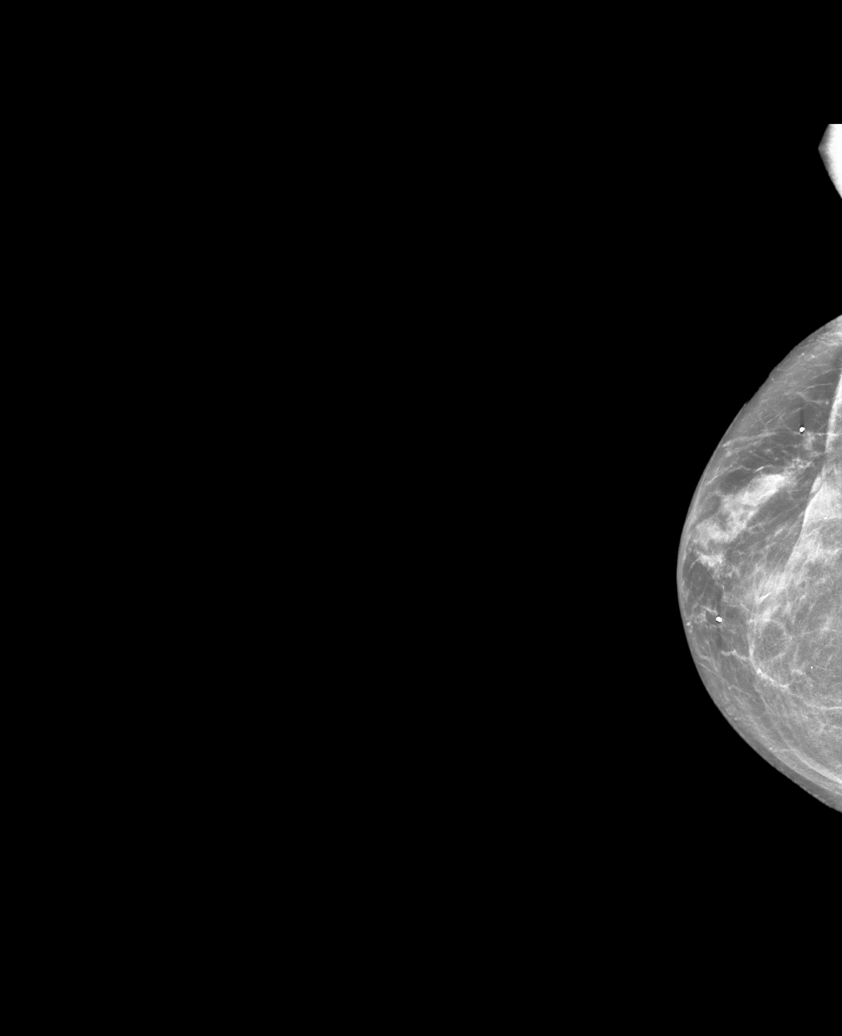

[6 of 36 positions shown; findings below may reference images not displayed]

ACR Breast Density Category c: The breast tissue is heterogeneously
dense, which may obscure small masses.
FINDINGS: There are no findings suspicious for malignancy. Images were
processed with CAD.
IMPRESSION: No mammographic evidence of malignancy. A result letter of this
screening mammogram will be mailed directly to the patient.

RECOMMENDATION:
Screening mammogram in one year. (Code:[5V])

BI-RADS CATEGORY  1: Negative.

## 2019-03-11 ENCOUNTER — Other Ambulatory Visit: Payer: Self-pay | Admitting: Cardiology

## 2019-03-11 DIAGNOSIS — I119 Hypertensive heart disease without heart failure: Secondary | ICD-10-CM

## 2019-03-11 DIAGNOSIS — E7849 Other hyperlipidemia: Secondary | ICD-10-CM

## 2019-03-11 NOTE — Telephone Encounter (Addendum)
Prescription refill request for Eliquis received.  Last office visit: Tele visit  01-26-2019 Rosita Fire) Scr:  0.7 (02-26-2019 [facility]) Age: 78 Weight: 62.1 kg ( per Rosita Fire note 01/26/2019)   Refill request sent

## 2019-04-05 DIAGNOSIS — C4359 Malignant melanoma of other part of trunk: Secondary | ICD-10-CM | POA: Diagnosis not present

## 2019-04-05 DIAGNOSIS — K620 Anal polyp: Secondary | ICD-10-CM | POA: Diagnosis not present

## 2019-04-14 ENCOUNTER — Other Ambulatory Visit: Payer: Self-pay | Admitting: General Surgery

## 2019-04-14 DIAGNOSIS — K620 Anal polyp: Secondary | ICD-10-CM

## 2019-04-21 ENCOUNTER — Other Ambulatory Visit: Payer: Self-pay

## 2019-04-21 ENCOUNTER — Encounter (HOSPITAL_COMMUNITY)
Admission: RE | Admit: 2019-04-21 | Discharge: 2019-04-21 | Disposition: A | Discharge status: Home / Self Care | Payer: Medicare Other | Source: Ambulatory Visit | Attending: General Surgery | Admitting: General Surgery

## 2019-04-21 DIAGNOSIS — K620 Anal polyp: Secondary | ICD-10-CM | POA: Insufficient documentation

## 2019-04-21 DIAGNOSIS — C4351 Malignant melanoma of anal skin: Secondary | ICD-10-CM | POA: Diagnosis not present

## 2019-04-21 IMAGING — NM NUCLEAR MEDICINE LYMPHANGIOGRAPHY
9 series · 9 of 9 positions shown · non-contrast
Comparison: None.

CLINICAL DATA: Anal melanoma

EXAM:
NUCLEAR MEDICINE LYMPHANGIOGRAPHY
TECHNIQUE: Sequential images were obtained following intradermal injection of
radiopharmaceutical at the tumor site in the perianal region.
RADIOPHARMACEUTICALS:  0.5 mCi millipore-filtered [6B] sulfur
colloid

[Series 1: lymph ant/post · 2.07mm/px · 1 of 1 slices shown]
[im 1/1]
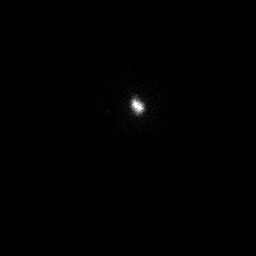

[Series 2: lymph ant/post 2 · 2.07mm/px · 1 of 1 slices shown]
[im 1/1  full-range]
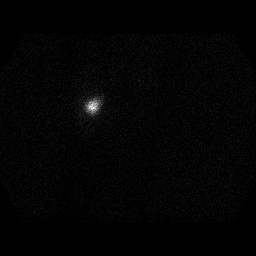

[Series 3: lymph ant/post 3 · 2.07mm/px · 1 of 1 slices shown]
[im 1/1  full-range]
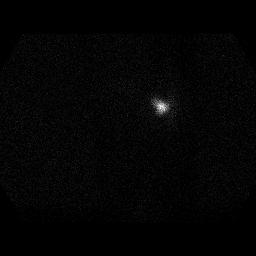

[Series 4: lymph ant/post 4 · 2.07mm/px · 1 of 1 slices shown]
[im 1/1]
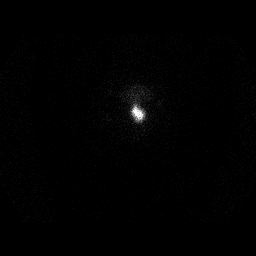

[Series 5: lymph ant/post 5 · 2.07mm/px · 1 of 1 slices shown]
[im 1/1  full-range]
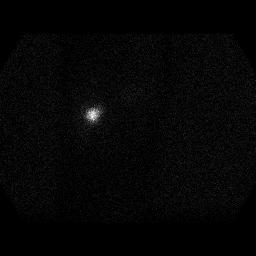

[Series 6: lymph ant/post 6 · 2.07mm/px · 1 of 1 slices shown]
[im 1/1  full-range]
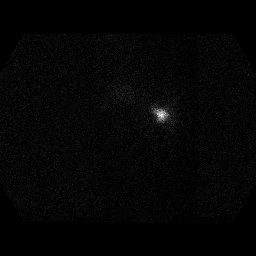

[Series 9: lymph lats 2 · 2.07mm/px · 1 of 1 slices shown]
[im 1/1]
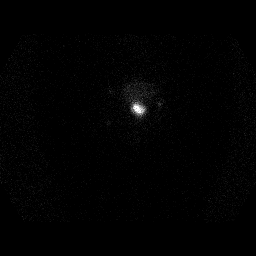

[Series 10: lymph lats 3 · 2.07mm/px · 1 of 1 slices shown]
[im 1/1  full-range]
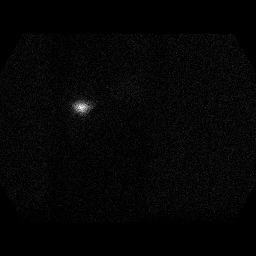

[Series 11: lymph lats 4 · 2.07mm/px · 1 of 1 slices shown]
[im 1/1  full-range]
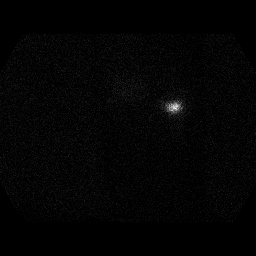

[9 of 9 positions shown; findings below may reference images not displayed]

FINDINGS: On the early delayed static image of the pelvis the there is a small
focus of increased radiotracer uptake localizing to the right
pelvis. On the 1 hour delayed images a second focus of increased
uptake is identified within the right hemipelvis and a third focus
is noted within the left hemipelvis. Unfortunately, on the lateral
projection images corresponding focus of increased uptake is not
identified to indicate whether these lymph nodes are within the
pelvis versus more anteriorly within the inguinal region.
IMPRESSION: 1. The first sentinel lymph node identified is in the right side of
pelvis. On further delayed images a second focus of increased uptake
is noted within the right pelvis and a third focus is noted on the
left. On the lateral projection images corresponding foci of
increased uptake are not identified to indicate whether not these
lymph nodes are within the anterior inguinal region or localize to
the iliac nodal chains.

## 2019-04-21 MED ORDER — TECHNETIUM TC 99M SULFUR COLLOID FILTERED
0.5000 | Freq: Once | INTRAVENOUS | Status: AC | PRN
  Administered 2019-04-21: 0.5 via INTRADERMAL

## 2019-04-29 ENCOUNTER — Ambulatory Visit: Payer: Self-pay | Admitting: General Surgery

## 2019-04-29 ENCOUNTER — Telehealth: Payer: Self-pay | Admitting: *Deleted

## 2019-04-29 DIAGNOSIS — C439 Malignant melanoma of skin, unspecified: Secondary | ICD-10-CM

## 2019-04-29 DIAGNOSIS — C210 Malignant neoplasm of anus, unspecified: Secondary | ICD-10-CM | POA: Diagnosis not present

## 2019-04-29 DIAGNOSIS — C211 Malignant neoplasm of anal canal: Secondary | ICD-10-CM | POA: Insufficient documentation

## 2019-04-29 NOTE — Telephone Encounter (Signed)
   Primary Cardiologist: [name], MD  Chart reviewed as part of pre-operative protocol coverage. Patient was contacted 04/29/2019 in reference to pre-operative risk assessment for pending surgery as outlined below.  Angela Carpenter was last seen on 01/26/2019 by Lyda Jester, PA. Angela Carpenter was doing well from a cardiac perspective with no anginal symptoms and good functional status without complication.  Per pharmacy:  Eliquis for afib with CHADS2VASc score of 6 (age x2, sex, HTN, CVA in 2016). SCr 0.7 checked on 02/26/19 at [place] holding Eliquis for 1 day prior to procedure due to elevated cardiac risk off of anticoagulation.  Therefore, based on ACC/AHA guidelines, the patient would be at acceptable risk for the planned procedure without further cardiovascular testing.   I will route this recommendation to the requesting party via Epic fax function and remove from pre-op pool.  Please call with questions.  Kathyrn Drown, NP 04/29/2019, 4:55 PM

## 2019-04-29 NOTE — Telephone Encounter (Signed)
   [facility] Pre-operative Risk Assessment    Request for surgical clearance:  1. What type of surgery is being performed? EXCISION OF ANAL MELANOMA AND SENTINEL LYMPH NODE BX  2. When is this surgery scheduled? TBD   3. What type of clearance is required (medical clearance vs. Pharmacy clearance to hold med vs. Both)? BOTH  4. Are there any medications that need to be held prior to surgery and how long? ELIQUIS  5. Practice name and name of physician performing surgery? [facility]; DR. [name]  6. What is your office phone number [phone]   7.   What is your office fax number [phone]  8.   Anesthesia type (None, local, MAC, general) ? GENERAL   Julaine Hua 04/29/2019, 4:17 PM  _________________________________________________________________   (provider comments below)

## 2019-04-29 NOTE — H&P (Signed)
History of Present Illness Angela Ruff MD; 1/61/0960 11:07 AM) The patient is a 78 year old female who presents with hemorrhoids. 78 year old female who presents to the office for evaluation of a "hemorrhoid". She states that she has noticed this since December 2019. She reports swelling and mucosal drainage, as well as inability to keep the area clean. She occasionally notices some contact bleeding. Denies any bleeding with bowel movements. Her last colonoscopy was 3 years ago and normal. She underwent a excision of the mass in the office. Pathology was consistent with anal melanoma.   Problem List/Past Medical Angela Ruff, MD; 4/54/0981 11:07 AM) MELANOMA OF ANUS (C21.0)  Past Surgical History Angela Ruff, MD; 1/91/4782 11:07 AM) Breast Augmentation Bilateral. Breast Biopsy Right. Breast Mass; Local Excision Right. Cataract Surgery Right. Colon Polyp Removal - Colonoscopy Sentinel Lymph Node Biopsy  Diagnostic Studies History Angela Ruff, MD; 9/56/2130 11:07 AM) Colonoscopy 1-5 years ago Mammogram within last year Pap Smear 1-5 years ago  Allergies Mammie Lorenzo, LPN; 8/65/7846 96:29 AM) No Known Drug Allergies [04/05/2019]:  Medication History Mammie Lorenzo, LPN; 03/04/4131 44:01 AM) Raloxifene HCl (60MG  Tablet, Oral) Active. Toprol XL (25MG  Tablet ER 24HR, Oral two times daily) Active. Atorvastatin Calcium (10MG  Tablet, Oral) Active. Timolol Maleate (0.5% Solution, Ophthalmic two times daily) Active. Alendronate Sodium (70MG  Tablet, Oral weekly) Active. Eliquis (5MG  Tablet, Oral) Active. PreserVision AREDS 2 (Oral two times daily) Active. Docusate Sodium (100MG  Capsule, Oral) Active. Vitamin D3 Complete (2000 IU Oral daily) Active. Medications Reconciled  Social History Angela Ruff, MD; 0/27/2536 11:07 AM) Alcohol use Remotely quit alcohol use. Caffeine use Carbonated beverages, Coffee, Tea. No drug use Tobacco use Former  smoker.  Family History Angela Ruff, MD; 6/44/0347 11:07 AM) Alcohol Abuse Mother. Depression Brother, Mother, Son. Heart disease in female family member before age 36 Malignant Neoplasm Of Pancreas Father. Respiratory Condition Mother.  Pregnancy / Birth History Angela Ruff, MD; 01/29/9562 11:07 AM) Age at menarche 46 years. Age of menopause 59-50 Gravida 3 Length (months) of breastfeeding 12-24 Maternal age 71-30 Regular periods  Other Problems Angela Ruff, MD; 8/75/6433 11:07 AM) Breast Cancer Cerebrovascular Accident Hemorrhoids Lump In Breast Melanoma     Review of Systems Angela Ruff MD; 2/95/1884 11:08 AM) General Not Present- Appetite Loss, Chills, Fatigue, Fever, Night Sweats, Weight Gain and Weight Loss. Skin Not Present- Change in Wart/Mole, Dryness, Hives, Jaundice, New Lesions, Non-Healing Wounds, Rash and Ulcer. HEENT Present- Seasonal Allergies. Not Present- Earache, Hearing Loss, Hoarseness, Nose Bleed, Oral Ulcers, Ringing in the Ears, Sinus Pain, Sore Throat, Visual Disturbances, Wears glasses/contact lenses and Yellow Eyes. Respiratory Not Present- Bloody sputum, Chronic Cough, Difficulty Breathing, Snoring and Wheezing. Breast Not Present- Breast Mass, Breast Pain, Nipple Discharge and Skin Changes. Cardiovascular Not Present- Chest Pain, Difficulty Breathing Lying Down, Leg Cramps, Palpitations, Rapid Heart Rate, Shortness of Breath and Swelling of Extremities. Gastrointestinal Present- Hemorrhoids. Not Present- Abdominal Pain, Bloating, Bloody Stool, Change in Bowel Habits, Chronic diarrhea, Constipation, Difficulty Swallowing, Excessive gas, Gets full quickly at meals, Indigestion, Nausea, Rectal Pain and Vomiting. Female Genitourinary Not Present- Frequency, Nocturia, Painful Urination, Pelvic Pain and Urgency. Musculoskeletal Not Present- Back Pain, Joint Pain, Joint Stiffness, Muscle Pain, Muscle Weakness and Swelling of  Extremities. Neurological Not Present- Decreased Memory, Fainting, Headaches, Numbness, Seizures, Tingling, Tremor, Trouble walking and Weakness. Psychiatric Not Present- Anxiety, Bipolar, Change in Sleep Pattern, Depression, Fearful and Frequent crying. Endocrine Not Present- Cold Intolerance, Excessive Hunger, Hair Changes, Heat Intolerance, Hot flashes and New Diabetes. Hematology Present- Blood Thinners.  Not Present- Easy Bruising, Excessive bleeding, Gland problems, HIV and Persistent Infections.  Vitals Claiborne Billings Dockery LPN; 6/75/4492 01:00 AM) 04/29/2019 10:52 AM Weight: 132.6 lb Height: 65in Body Surface Area: 1.66 m Body Mass Index: 22.07 kg/m  Temp.: 61F(Temporal)  Pulse: 96 (Regular)  BP: 128/64 (Sitting, Left Arm, Standard)        Physical Exam Angela Ruff MD; 04/17/1974 11:07 AM)  General Mental Status-Alert. General Appearance-Not in acute distress. Build & Nutrition-Well nourished. Posture-Normal posture. Gait-Normal.  Head and Neck Head-normocephalic, atraumatic with no lesions or palpable masses. Trachea-midline.  Chest and Lung Exam Chest and lung exam reveals -on auscultation, normal breath sounds, no adventitious sounds and normal vocal resonance.  Cardiovascular Cardiovascular examination reveals -normal heart sounds, regular rate and rhythm with no murmurs and no digital clubbing, cyanosis, edema, increased warmth or tenderness.  Abdomen Inspection Inspection of the abdomen reveals - No Hernias. Palpation/Percussion Palpation and Percussion of the abdomen reveal - Soft, Non Tender, No Rigidity (guarding), No hepatosplenomegaly and No Palpable abdominal masses.  Neurologic Neurologic evaluation reveals -alert and oriented x 3 with no impairment of recent or remote memory, normal attention span and ability to concentrate, normal sensation and normal coordination.  Musculoskeletal Normal Exam - Bilateral-Upper  Extremity Strength Normal and Lower Extremity Strength Normal.   Path: Malignant melanoma Breslow's depth: 1.6 cm. Clark's level: IV Lateral margins unclear, deep margin 0.5 mm Mitotic index 11/mm2 Lymphovascular invasion present T4B, NX, MX   Assessment & Plan Angela Ruff MD; 8/83/2549 11:06 AM)  MELANOMA OF ANUS (C21.0) Impression: 78 year old female who presents to the office for evaluation after excision of a large anal polyp. This showed anal melanoma. I have recommended reexcision of her surgical scar with sentinel lymph node biopsy. I will have Dr. Barry Dienes assist me with this. We have gotten lymphoscintigraphy, which shows 2 sentinel lymph nodes in the right side and one in the left. Patient understands that she may have bilateral groin incisions. We discussed possible extended stay after surgery. We also discussed the possibility of discharge the same day, depending on the straightforwardness of the surgery. Risks include pain, bleeding and recurrence. Risk of sentinel lymph node excision include infection, seroma and lymphedema. We discussed that all of these risks are relatively small. Patient has a history of breast cancer and is familiar with the process.

## 2019-04-29 NOTE — Telephone Encounter (Signed)
Pt takes Eliquis for afib with CHADS2VASc score of 6 (age x2, sex, HTN, CVA in 2016). SCr 0.7 checked on 02/26/19 at [place] holding Eliquis for 1 day prior to procedure due to elevated cardiac risk off of anticoagulation.

## 2019-05-07 DIAGNOSIS — H524 Presbyopia: Secondary | ICD-10-CM | POA: Diagnosis not present

## 2019-05-07 DIAGNOSIS — H2512 Age-related nuclear cataract, left eye: Secondary | ICD-10-CM | POA: Diagnosis not present

## 2019-05-07 DIAGNOSIS — H401131 Primary open-angle glaucoma, bilateral, mild stage: Secondary | ICD-10-CM | POA: Diagnosis not present

## 2019-05-25 NOTE — Progress Notes (Signed)
[facility] - [address], [place] [address] Phone: [phone] Fax: [phone]  [facility] [place], [place] - [address] [place] [facility] [place] [address] Phone: [phone] Fax: [phone]  [facility] [place], [place] [address] [place] [address] Phone: [phone] Fax: [place] [address], [place] [address] Phone: [phone] Fax: [phone]      Your procedure is scheduled on August 26  Report to [facility] Main Entrance "A" at 0800 A.M., and check in at the Admitting office.  Call this number if you have problems the morning of surgery:  [phone]  Call [phone] if you have any questions prior to your surgery date Monday-Friday 8am-4pm    Remember:  Do not eat or drink after midnight the night before your surgery     Take these medicines the morning of surgery with A SIP OF WATER  atorvastatin (LIPITOR)  Eye drops if needed TOPROL XL  Follow your surgeon's instructions on when to stop Eliquis.  If no instructions were given by your surgeon then you will need to call the office to get those instructions.    7 days prior to surgery STOP taking any Aspirin (unless otherwise instructed by your surgeon), Aleve, Naproxen, Ibuprofen, Motrin, Advil, Goody's, BC's, all herbal medications, fish oil, and all vitamins.    The Morning of Surgery  Do not wear jewelry, make-up or nail polish.  Do not wear lotions, powders, or perfumes, or deodorant  Do not shave 48 hours prior to surgery.    Do not bring valuables to the hospital.  [facility] is not responsible for any belongings or valuables.  If you are a smoker, DO NOT Smoke 24 hours prior to surgery IF you wear a CPAP at night please bring  your mask, tubing, and machine the morning of surgery   Remember that you must have someone to transport you home after your surgery, and remain with you for 24 hours if you are discharged the same day.   Contacts, glasses, hearing aids, dentures or bridgework may not be worn into surgery.    Leave your suitcase in the car.  After surgery it may be brought to your room.  For patients admitted to the hospital, discharge time will be determined by your treatment team.  Patients discharged the day of surgery will not be allowed to drive home.    Special instructions:   [facility]- Preparing For Surgery  Before surgery, you can play an important role. Because skin is not sterile, your skin needs to be as free of germs as possible. You can reduce the number of germs on your skin by washing with CHG (chlorahexidine gluconate) Soap before surgery.  CHG is an antiseptic cleaner which kills germs and bonds with the skin to continue killing germs even after washing.    Oral Hygiene is also important to reduce your risk of infection.  Remember - BRUSH YOUR TEETH THE MORNING OF SURGERY WITH YOUR REGULAR TOOTHPASTE  Please do not use if you have an allergy to CHG or antibacterial soaps. If your skin becomes reddened/irritated stop using the CHG.  Do not shave (including legs and underarms) for at least 48 hours prior to first CHG shower. It  is OK to shave your face.  Please follow these instructions carefully.   1. Shower the NIGHT BEFORE SURGERY and the MORNING OF SURGERY with CHG Soap.   2. If you chose to wash your hair, wash your hair first as usual with your normal shampoo.  3. After you shampoo, rinse your hair and body thoroughly to remove the shampoo.  4. Use CHG as you would any other liquid soap. You can apply CHG directly to the skin and wash gently with a scrungie or a clean washcloth.   5. Apply the CHG Soap to your body ONLY FROM THE NECK DOWN.  Do not use on open wounds or open  sores. Avoid contact with your eyes, ears, mouth and genitals (private parts). Wash Face and genitals (private parts)  with your normal soap.   6. Wash thoroughly, paying special attention to the area where your surgery will be performed.  7. Thoroughly rinse your body with warm water from the neck down.  8. DO NOT shower/wash with your normal soap after using and rinsing off the CHG Soap.  9. Pat yourself dry with a CLEAN TOWEL.  10. Wear CLEAN PAJAMAS to bed the night before surgery, wear comfortable clothes the morning of surgery  11. Place CLEAN SHEETS on your bed the night of your first shower and DO NOT SLEEP WITH PETS.    Day of Surgery:  Do not apply any deodorants/lotions. Please shower the morning of surgery with the CHG soap  Please wear clean clothes to the hospital/surgery center.   Remember to brush your teeth WITH YOUR REGULAR TOOTHPASTE.   Please read over the following fact sheets that you were given.

## 2019-05-26 ENCOUNTER — Other Ambulatory Visit: Payer: Self-pay

## 2019-05-26 ENCOUNTER — Encounter (HOSPITAL_COMMUNITY): Payer: Self-pay

## 2019-05-26 ENCOUNTER — Encounter (HOSPITAL_COMMUNITY)
Admission: RE | Admit: 2019-05-26 | Discharge: 2019-05-26 | Disposition: A | Discharge status: Home / Self Care | Payer: Medicare Other | Source: Ambulatory Visit | Attending: General Surgery | Admitting: General Surgery

## 2019-05-26 DIAGNOSIS — H409 Unspecified glaucoma: Secondary | ICD-10-CM | POA: Insufficient documentation

## 2019-05-26 DIAGNOSIS — Z9221 Personal history of antineoplastic chemotherapy: Secondary | ICD-10-CM | POA: Diagnosis not present

## 2019-05-26 DIAGNOSIS — Z853 Personal history of malignant neoplasm of breast: Secondary | ICD-10-CM | POA: Diagnosis not present

## 2019-05-26 DIAGNOSIS — Z01818 Encounter for other preprocedural examination: Secondary | ICD-10-CM | POA: Diagnosis not present

## 2019-05-26 DIAGNOSIS — Z7983 Long term (current) use of bisphosphonates: Secondary | ICD-10-CM | POA: Insufficient documentation

## 2019-05-26 DIAGNOSIS — Z923 Personal history of irradiation: Secondary | ICD-10-CM | POA: Insufficient documentation

## 2019-05-26 DIAGNOSIS — I4891 Unspecified atrial fibrillation: Secondary | ICD-10-CM | POA: Diagnosis not present

## 2019-05-26 DIAGNOSIS — E7800 Pure hypercholesterolemia, unspecified: Secondary | ICD-10-CM | POA: Diagnosis not present

## 2019-05-26 DIAGNOSIS — I491 Atrial premature depolarization: Secondary | ICD-10-CM | POA: Diagnosis not present

## 2019-05-26 DIAGNOSIS — Z8673 Personal history of transient ischemic attack (TIA), and cerebral infarction without residual deficits: Secondary | ICD-10-CM | POA: Diagnosis not present

## 2019-05-26 DIAGNOSIS — C210 Malignant neoplasm of anus, unspecified: Secondary | ICD-10-CM | POA: Diagnosis not present

## 2019-05-26 DIAGNOSIS — Z79899 Other long term (current) drug therapy: Secondary | ICD-10-CM | POA: Insufficient documentation

## 2019-05-26 DIAGNOSIS — Z20828 Contact with and (suspected) exposure to other viral communicable diseases: Secondary | ICD-10-CM | POA: Diagnosis not present

## 2019-05-26 DIAGNOSIS — Z7901 Long term (current) use of anticoagulants: Secondary | ICD-10-CM | POA: Insufficient documentation

## 2019-05-26 DIAGNOSIS — M810 Age-related osteoporosis without current pathological fracture: Secondary | ICD-10-CM | POA: Insufficient documentation

## 2019-05-26 HISTORY — DX: Unspecified atrial fibrillation: I48.91

## 2019-05-26 LAB — BASIC METABOLIC PANEL
Anion gap: 8 (ref 5–15)
BUN: 15 mg/dL (ref 8–23)
CO2: 26 mmol/L (ref 22–32)
Calcium: 9.3 mg/dL (ref 8.9–10.3)
Chloride: 103 mmol/L (ref 98–111)
Creatinine, Ser: 0.65 mg/dL (ref 0.44–1.00)
GFR calc Af Amer: >60 mL/min (ref >60)
GFR calc non Af Amer: >60 mL/min (ref >60)
Glucose, Bld: 115 mg/dL — ABNORMAL HIGH (ref 70–99)
Potassium: 4.5 mmol/L (ref 3.5–5.1)
Sodium: 137 mmol/L (ref 135–145)

## 2019-05-26 LAB — CBC
HCT: 44.4 % (ref 36.0–46.0)
Hemoglobin: 14.3 g/dL (ref 12.0–15.0)
MCH: 30.4 pg (ref 26.0–34.0)
MCHC: 32.2 g/dL (ref 30.0–36.0)
MCV: 94.3 fL (ref 80.0–100.0)
Platelets: 290 10*3/uL (ref 150–400)
RBC: 4.71 MIL/uL (ref 3.87–5.11)
RDW: 13.5 % (ref 11.5–15.5)
WBC: 9 10*3/uL (ref 4.0–10.5)
nRBC: 0 % (ref 0.0–0.2)

## 2019-05-26 NOTE — Progress Notes (Signed)
PCP - Domenick Gong Cardiologist - Ottie Glazier  Chest x-ray - not needed EKG - 05/26/19 Stress Test - denies ECHO - 2016 Cardiac Cath- denie   Blood Thinner Instructions: last dose of Eliquis on 8/24  Anesthesia review: yes, hx afib  Patient denies shortness of breath, fever, cough and chest pain at PAT appointment   Patient verbalized understanding of instructions that were given to them at the PAT appointment. Patient was also instructed that they will need to review over the PAT instructions again at home before surgery.

## 2019-05-27 NOTE — Anesthesia Preprocedure Evaluation (Addendum)
Anesthesia Evaluation  Patient identified by MRN, date of birth, ID band Patient awake    Reviewed: Allergy & Precautions, NPO status   Airway Mallampati: II  TM Distance: >3 FB     Dental   Pulmonary    breath sounds clear to auscultation       Cardiovascular hypertension,  Rhythm:Regular Rate:Normal     Neuro/Psych    GI/Hepatic negative GI ROS, Neg liver ROS,   Endo/Other  negative endocrine ROS  Renal/GU negative Renal ROS     Musculoskeletal   Abdominal   Peds  Hematology   Anesthesia Other Findings   Reproductive/Obstetrics                           Anesthesia Physical Anesthesia Plan  ASA: III  Anesthesia Plan: General   Post-op Pain Management:    Induction: Intravenous  PONV Risk Score and Plan: 3 and Treatment may vary due to age or medical condition, Ondansetron, Dexamethasone and Midazolam  Airway Management Planned: LMA  Additional Equipment:   Intra-op Plan:   Post-operative Plan: Extubation in OR  Informed Consent:     Dental advisory given  Plan Discussed with: CRNA and Anesthesiologist  Anesthesia Plan Comments: (PAT note written 05/27/2019 by Myra Gianotti, PA-C. )       Anesthesia Quick Evaluation

## 2019-05-27 NOTE — Progress Notes (Signed)
Anesthesia Chart Review:  Case: [id number] Date/Time: 06/02/19 0945   Procedure: ANAL MELANOMA EXCISION WITH SENTINEL LYMPH NODE BIOPSY (N/A )   Anesthesia type: General   Pre-op diagnosis: ANAL MELANOMA   Location: [place] OR ROOM 02 / [place] OR   Surgeon: Angela Ruff, MD      DISCUSSION: Patient is a 78 year old female scheduled for the above procedure. Dx: Anal melanoma.  Other history includes passive smoke exposure, afib (2016), breast cancer (s/p right lumpectomy, chemoradiation 2006; s/p left lumpectomy 2016), hypercholesterolemia, HTN (denied), CVA (11/2014, [facility]), glaucoma.   Preoperative cardiology input outlined by Angela Drown, NP on 04/29/19, "Angela Carpenter was last seen on 01/26/2019 by Angela Jester, PA. Angela Carpenter was doing well from a cardiac perspective with no anginal symptoms and good functional status without complication.  Per pharmacy:  Eliquis for afib with CHADS2VASc score of 6 (age x2, sex, HTN, CVA in 2016). SCr 0.7 checked on 02/26/19 at [place] holding Eliquis for 1 day prior to procedure due to elevated cardiac risk off of anticoagulation.  Therefore, based on ACC/AHA guidelines, the patient would be at acceptable risk for the planned procedure without further cardiovascular testing."  Last Eliquis 05/31/19.  She is for PT/INR on the day of surgery.  Presurgical COVID-19 test scheduled for 05/29/19.   VS: BP (!) 141/67   Pulse 85   Temp 36.5 C   Resp 18   Ht 5\' 5"  (1.651 m)   Wt 59.4 kg   SpO2 100%   BMI 21.80 kg/m    PROVIDERS: Angela Carpenter, Angela Him, MD is PCP Angela Dawley, MD is cardiologist   LABS: Labs reviewed: Acceptable for surgery. (all labs ordered are listed, but only abnormal results are displayed)  Labs Reviewed  BASIC METABOLIC PANEL - Abnormal; Notable for the following components:      Result Value   Glucose, Bld 115 (*)    All other components within normal limits  CBC     EKG:  05/26/19: Sinus rhythm with Premature supraventricular complexes Otherwise normal ECG Confirmed by Angela Carpenter ([id number]) on 05/26/2019 3:17:58 PM   CV: TEE 12/05/14 ([facility]): Impression: 1.  Normal biventricular size and function.  Estimated left ventricular ejection fraction is 60 to 65%. 2.  No significant valvular disease.  Mild mitral regurgitation.  Mild tricuspid regurgitation.  Trace pulmonary regurgitation.  3.  Saline contrast study negative. 4.  No left atrial and left atrial appendage thrombus.  No atheroma seen in the ascending aorta, arch and proximal portion of descending aorta.  No obvious cardiac source of emboli.   Past Medical History:  Diagnosis Date  . A-fib ([place])   . Atrial fibrillation ([place])    4 years ago started  . Breast cancer ([place]) 2006  . Colon polyps   . Glaucoma   . Hypercholesteremia   . Hypertension    patient denies   . Osteoporosis   . Personal history of chemotherapy 2006  . Personal history of radiation therapy 2006  . Stroke (cerebrum) ([place]) 2015    Past Surgical History:  Procedure Laterality Date  . AUGMENTATION MAMMAPLASTY Bilateral 2006   Patient had them removed in 2008  . BREAST BIOPSY Left 2016  . BREAST LUMPECTOMY Right 2006  . COLONOSCOPY     had polyps  . EYE SURGERY Right    cataract  . TONSILLECTOMY      MEDICATIONS: . alendronate (FOSAMAX) 70 MG tablet  . atorvastatin (LIPITOR) 10 MG tablet  .  Cholecalciferol (VITAMIN D-3) 125 MCG (5000 UT) TABS  . docusate sodium (COLACE) 100 MG capsule  . ELIQUIS 5 MG TABS tablet  . Multiple Vitamins-Minerals (PRESERVISION AREDS 2+MULTI VIT) CAPS  . raloxifene (EVISTA) 60 MG tablet  . timolol (TIMOPTIC) 0.5 % ophthalmic solution  . TOPROL XL 25 MG 24 hr tablet   . 0.9 %  sodium chloride infusion    Angela Gianotti, PA-C Surgical Short Stay/Anesthesiology [facility] Phone [phone] [facility] Phone [phone] 05/27/2019 3:45 PM

## 2019-05-29 ENCOUNTER — Other Ambulatory Visit (HOSPITAL_COMMUNITY)
Admission: RE | Admit: 2019-05-29 | Discharge: 2019-05-29 | Disposition: A | Discharge status: Home / Self Care | Payer: Medicare Other | Source: Ambulatory Visit | Attending: General Surgery | Admitting: General Surgery

## 2019-05-29 DIAGNOSIS — C210 Malignant neoplasm of anus, unspecified: Secondary | ICD-10-CM | POA: Diagnosis not present

## 2019-05-29 DIAGNOSIS — Z79899 Other long term (current) drug therapy: Secondary | ICD-10-CM | POA: Diagnosis not present

## 2019-05-29 DIAGNOSIS — Z20828 Contact with and (suspected) exposure to other viral communicable diseases: Secondary | ICD-10-CM | POA: Diagnosis not present

## 2019-05-29 DIAGNOSIS — Z01818 Encounter for other preprocedural examination: Secondary | ICD-10-CM | POA: Diagnosis not present

## 2019-05-29 DIAGNOSIS — I4891 Unspecified atrial fibrillation: Secondary | ICD-10-CM | POA: Diagnosis not present

## 2019-05-29 DIAGNOSIS — I491 Atrial premature depolarization: Secondary | ICD-10-CM | POA: Diagnosis not present

## 2019-05-30 LAB — SARS CORONAVIRUS 2 (TAT 6-24 HRS): SARS Coronavirus 2: NEGATIVE

## 2019-06-02 ENCOUNTER — Encounter (HOSPITAL_COMMUNITY)
Admission: RE | Disposition: A | Discharge status: Home / Self Care | Payer: Self-pay | Source: Home / Self Care | Attending: General Surgery

## 2019-06-02 ENCOUNTER — Encounter (HOSPITAL_COMMUNITY): Payer: Self-pay | Admitting: *Deleted

## 2019-06-02 ENCOUNTER — Ambulatory Visit (HOSPITAL_COMMUNITY)
Admission: RE | Admit: 2019-06-02 | Discharge: 2019-06-02 | Disposition: A | Discharge status: Home / Self Care | Payer: Medicare Other | Attending: General Surgery | Admitting: General Surgery

## 2019-06-02 ENCOUNTER — Ambulatory Visit (HOSPITAL_COMMUNITY): Payer: Medicare Other | Admitting: Vascular Surgery

## 2019-06-02 ENCOUNTER — Ambulatory Visit (HOSPITAL_COMMUNITY)
Admission: RE | Admit: 2019-06-02 | Discharge: 2019-06-02 | Disposition: A | Discharge status: Home / Self Care | Payer: Medicare Other | Source: Ambulatory Visit | Attending: General Surgery | Admitting: General Surgery

## 2019-06-02 ENCOUNTER — Other Ambulatory Visit: Payer: Self-pay

## 2019-06-02 ENCOUNTER — Ambulatory Visit (HOSPITAL_COMMUNITY): Payer: Medicare Other

## 2019-06-02 ENCOUNTER — Ambulatory Visit (HOSPITAL_COMMUNITY): Payer: Medicare Other | Admitting: Certified Registered"

## 2019-06-02 DIAGNOSIS — Z79810 Long term (current) use of selective estrogen receptor modulators (SERMs): Secondary | ICD-10-CM | POA: Diagnosis not present

## 2019-06-02 DIAGNOSIS — Z7901 Long term (current) use of anticoagulants: Secondary | ICD-10-CM | POA: Insufficient documentation

## 2019-06-02 DIAGNOSIS — C4351 Malignant melanoma of anal skin: Secondary | ICD-10-CM | POA: Diagnosis not present

## 2019-06-02 DIAGNOSIS — Z419 Encounter for procedure for purposes other than remedying health state, unspecified: Secondary | ICD-10-CM

## 2019-06-02 DIAGNOSIS — C44500 Unspecified malignant neoplasm of anal skin: Secondary | ICD-10-CM | POA: Diagnosis not present

## 2019-06-02 DIAGNOSIS — C210 Malignant neoplasm of anus, unspecified: Secondary | ICD-10-CM | POA: Diagnosis not present

## 2019-06-02 DIAGNOSIS — Z79899 Other long term (current) drug therapy: Secondary | ICD-10-CM | POA: Diagnosis not present

## 2019-06-02 DIAGNOSIS — C439 Malignant melanoma of skin, unspecified: Secondary | ICD-10-CM | POA: Diagnosis not present

## 2019-06-02 DIAGNOSIS — Z8673 Personal history of transient ischemic attack (TIA), and cerebral infarction without residual deficits: Secondary | ICD-10-CM | POA: Insufficient documentation

## 2019-06-02 DIAGNOSIS — I10 Essential (primary) hypertension: Secondary | ICD-10-CM | POA: Insufficient documentation

## 2019-06-02 DIAGNOSIS — I480 Paroxysmal atrial fibrillation: Secondary | ICD-10-CM | POA: Diagnosis not present

## 2019-06-02 DIAGNOSIS — E785 Hyperlipidemia, unspecified: Secondary | ICD-10-CM | POA: Diagnosis not present

## 2019-06-02 DIAGNOSIS — C50919 Malignant neoplasm of unspecified site of unspecified female breast: Secondary | ICD-10-CM | POA: Diagnosis not present

## 2019-06-02 DIAGNOSIS — I119 Hypertensive heart disease without heart failure: Secondary | ICD-10-CM | POA: Diagnosis not present

## 2019-06-02 HISTORY — PX: MELANOMA EXCISION WITH SENTINEL LYMPH NODE BIOPSY: SHX5267

## 2019-06-02 LAB — PROTIME-INR
INR: 1.1 (ref 0.8–1.2)
Prothrombin Time: 14 seconds (ref 11.4–15.2)

## 2019-06-02 IMAGING — CR PELVIS - 1-2 VIEW
1 series · 1 of 1 positions shown · non-contrast
Comparison: None.

CLINICAL DATA: Missing needle.  Melanoma.

EXAM:
PELVIS - 1-2 VIEW

[AP]
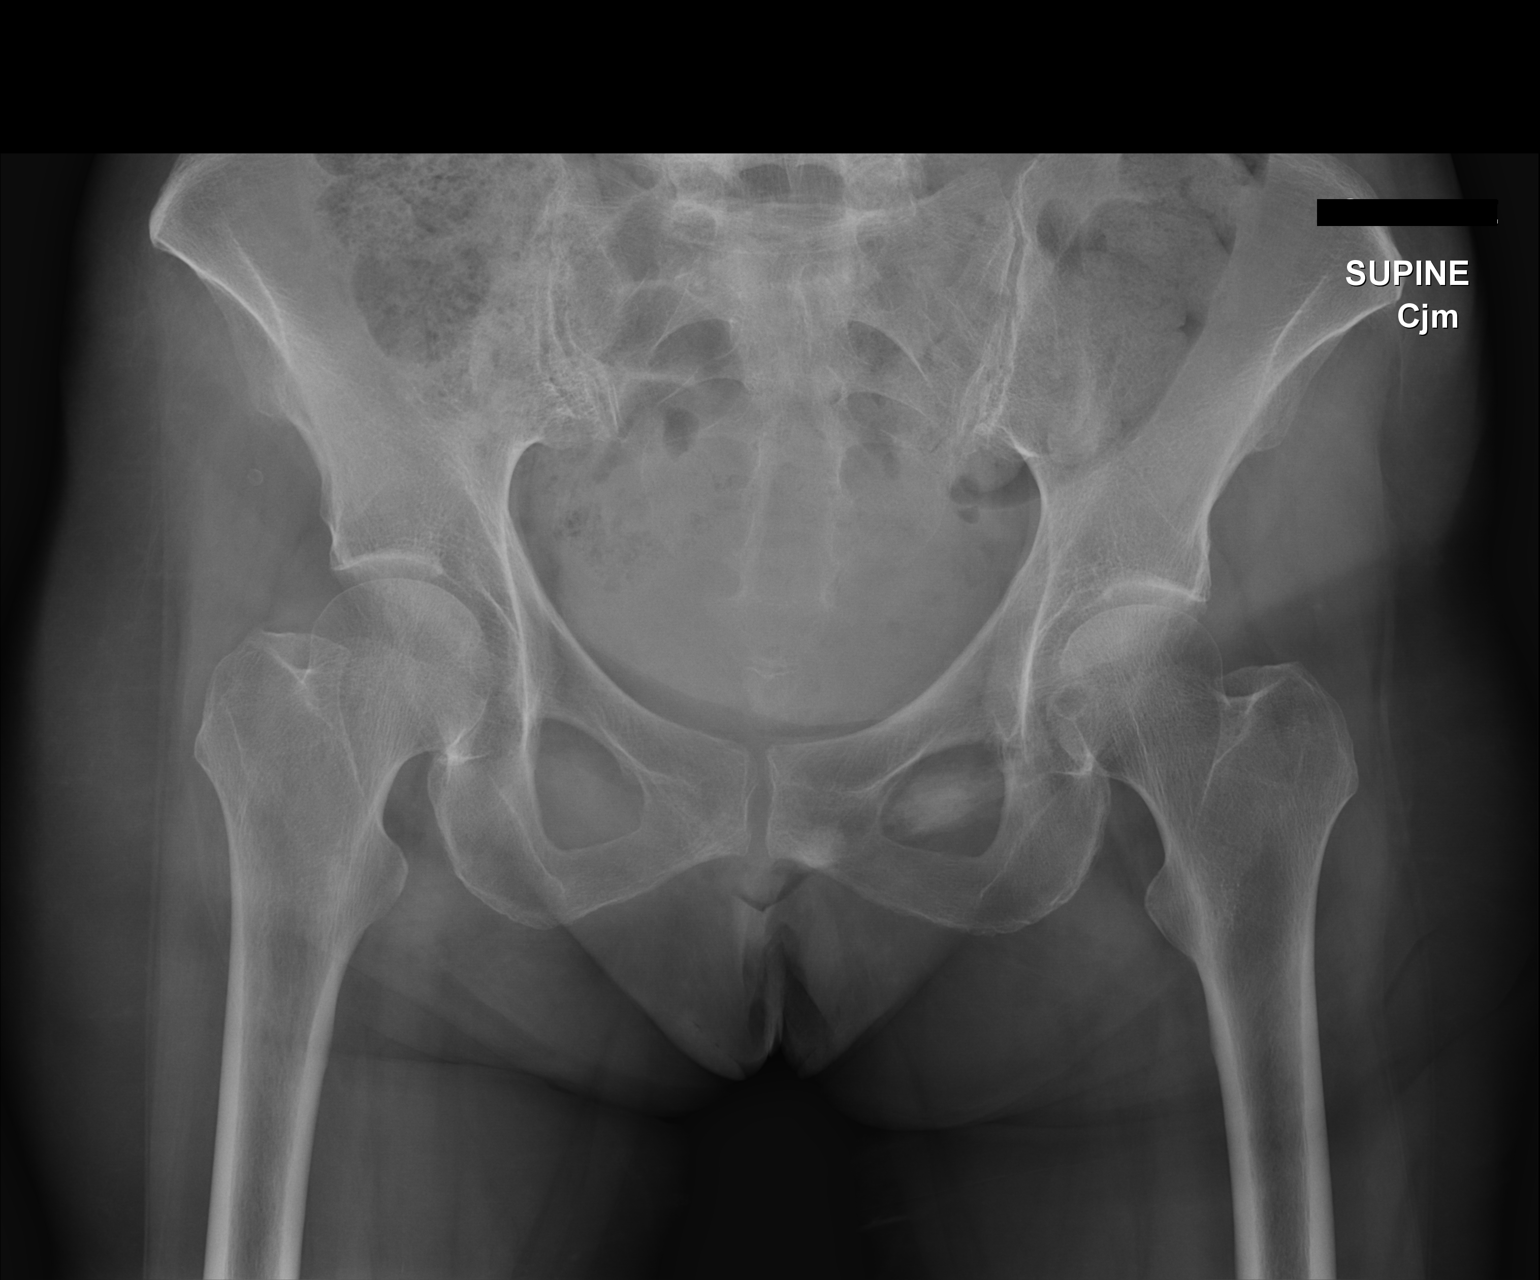

[1 of 1 positions shown; findings below may reference images not displayed]

FINDINGS: Frontal view obtained. No radiopaque foreign body. In particular, no
needle evident. No fracture or dislocation. No blastic or lytic bone
lesions. No evident arthropathy.
IMPRESSION: No retained needle evident. Bony structures appear intact. No
fracture or dislocation. No blastic or lytic bone lesions. No
appreciable arthropathy.

## 2019-06-02 SURGERY — MELANOMA EXCISION WITH SENTINEL LYMPH NODE BIOPSY
Anesthesia: General | Site: Rectum

## 2019-06-02 MED ORDER — ACETAMINOPHEN 325 MG PO TABS: 650.0000 mg | ORAL_TABLET | ORAL | Status: DC | PRN

## 2019-06-02 MED ORDER — GABAPENTIN 300 MG PO CAPS
300.0000 mg | ORAL_CAPSULE | ORAL | Status: AC
  Administered 2019-06-02: 300 mg via ORAL
  Filled 2019-06-02: qty 1

## 2019-06-02 MED ORDER — BUPIVACAINE HCL (PF) 0.25 % IJ SOLN
INTRAMUSCULAR | Status: DC | PRN
  Administered 2019-06-02: 10 mL

## 2019-06-02 MED ORDER — LACTATED RINGERS IV SOLN: INTRAVENOUS | Status: DC

## 2019-06-02 MED ORDER — ACETAMINOPHEN 650 MG RE SUPP: 650.0000 mg | RECTAL | Status: DC | PRN

## 2019-06-02 MED ORDER — ONDANSETRON HCL 4 MG/2ML IJ SOLN
INTRAMUSCULAR | Status: DC | PRN
  Administered 2019-06-02: 4 mg via INTRAVENOUS

## 2019-06-02 MED ORDER — PROPOFOL 10 MG/ML IV BOLUS
INTRAVENOUS | Status: AC
  Filled 2019-06-02: qty 40

## 2019-06-02 MED ORDER — LIDOCAINE-EPINEPHRINE (PF) 1 %-1:200000 IJ SOLN
INTRAMUSCULAR | Status: DC | PRN
  Administered 2019-06-02: 10 mL

## 2019-06-02 MED ORDER — HYDROMORPHONE HCL 1 MG/ML IJ SOLN: 0.2500 mg | INTRAMUSCULAR | Status: DC | PRN

## 2019-06-02 MED ORDER — TECHNETIUM TC 99M SULFUR COLLOID FILTERED
0.5000 | Freq: Once | INTRAVENOUS | Status: AC | PRN
  Administered 2019-06-02: 0.5 via INTRADERMAL

## 2019-06-02 MED ORDER — FENTANYL CITRATE (PF) 100 MCG/2ML IJ SOLN
INTRAMUSCULAR | Status: DC | PRN
  Administered 2019-06-02 (×2): 50 ug via INTRAVENOUS

## 2019-06-02 MED ORDER — FENTANYL CITRATE (PF) 100 MCG/2ML IJ SOLN
50.0000 ug | Freq: Once | INTRAMUSCULAR | Status: AC
  Administered 2019-06-02: 09:00:00 50 ug via INTRAVENOUS

## 2019-06-02 MED ORDER — DEXAMETHASONE SODIUM PHOSPHATE 4 MG/ML IJ SOLN
INTRAMUSCULAR | Status: DC | PRN
  Administered 2019-06-02: 10 mg via INTRAVENOUS

## 2019-06-02 MED ORDER — FENTANYL CITRATE (PF) 100 MCG/2ML IJ SOLN
INTRAMUSCULAR | Status: AC
  Administered 2019-06-02: 09:00:00 50 ug via INTRAVENOUS
  Filled 2019-06-02: qty 2

## 2019-06-02 MED ORDER — BUPIVACAINE-EPINEPHRINE 0.5% -1:200000 IJ SOLN
INTRAMUSCULAR | Status: DC | PRN
  Administered 2019-06-02: 5 mL

## 2019-06-02 MED ORDER — PHENYLEPHRINE HCL (PRESSORS) 10 MG/ML IV SOLN
INTRAVENOUS | Status: DC | PRN
  Administered 2019-06-02 (×3): 100 ug via INTRAVENOUS

## 2019-06-02 MED ORDER — LIDOCAINE HCL (CARDIAC) PF 100 MG/5ML IV SOSY
PREFILLED_SYRINGE | INTRAVENOUS | Status: DC | PRN
  Administered 2019-06-02: 30 mg via INTRAVENOUS

## 2019-06-02 MED ORDER — FENTANYL CITRATE (PF) 100 MCG/2ML IJ SOLN: 25.0000 ug | INTRAMUSCULAR | Status: DC | PRN

## 2019-06-02 MED ORDER — SODIUM CHLORIDE 0.9% FLUSH: 3.0000 mL | Freq: Two times a day (BID) | INTRAVENOUS | Status: DC

## 2019-06-02 MED ORDER — MIDAZOLAM HCL 5 MG/5ML IJ SOLN
INTRAMUSCULAR | Status: DC | PRN
  Administered 2019-06-02: 2 mg via INTRAVENOUS

## 2019-06-02 MED ORDER — FENTANYL CITRATE (PF) 250 MCG/5ML IJ SOLN
INTRAMUSCULAR | Status: AC
  Filled 2019-06-02: qty 5

## 2019-06-02 MED ORDER — BUPIVACAINE HCL (PF) 0.5 % IJ SOLN
INTRAMUSCULAR | Status: AC
  Filled 2019-06-02: qty 30

## 2019-06-02 MED ORDER — BUPIVACAINE-EPINEPHRINE (PF) 0.5% -1:200000 IJ SOLN
INTRAMUSCULAR | Status: AC
  Filled 2019-06-02: qty 30

## 2019-06-02 MED ORDER — SODIUM CHLORIDE 0.9% FLUSH: 3.0000 mL | INTRAVENOUS | Status: DC | PRN

## 2019-06-02 MED ORDER — PROPOFOL 10 MG/ML IV BOLUS
INTRAVENOUS | Status: DC | PRN
  Administered 2019-06-02: 150 mg via INTRAVENOUS

## 2019-06-02 MED ORDER — BUPIVACAINE LIPOSOME 1.3 % IJ SUSP
20.0000 mL | Freq: Once | INTRAMUSCULAR | Status: DC
  Filled 2019-06-02: qty 20

## 2019-06-02 MED ORDER — TRAMADOL HCL 50 MG PO TABS: 50.0000 mg | ORAL_TABLET | Freq: Four times a day (QID) | ORAL | 0 refills | Status: DC | PRN

## 2019-06-02 MED ORDER — CEFAZOLIN SODIUM-DEXTROSE 2-4 GM/100ML-% IV SOLN
2.0000 g | INTRAVENOUS | Status: AC
  Administered 2019-06-02: 2 g via INTRAVENOUS
  Filled 2019-06-02: qty 100

## 2019-06-02 MED ORDER — METHYLENE BLUE 0.5 % INJ SOLN
INTRAVENOUS | Status: AC
  Filled 2019-06-02: qty 10

## 2019-06-02 MED ORDER — 0.9 % SODIUM CHLORIDE (POUR BTL) OPTIME
TOPICAL | Status: DC | PRN
  Administered 2019-06-02: 1000 mL

## 2019-06-02 MED ORDER — OXYCODONE HCL 5 MG PO TABS: 5.0000 mg | ORAL_TABLET | ORAL | Status: DC | PRN

## 2019-06-02 MED ORDER — SODIUM CHLORIDE 0.9 % IV SOLN: 250.0000 mL | INTRAVENOUS | Status: DC | PRN

## 2019-06-02 MED ORDER — ACETAMINOPHEN 500 MG PO TABS
1000.0000 mg | ORAL_TABLET | ORAL | Status: AC
  Administered 2019-06-02: 08:00:00 1000 mg via ORAL
  Filled 2019-06-02: qty 2

## 2019-06-02 MED ORDER — MIDAZOLAM HCL 2 MG/2ML IJ SOLN
INTRAMUSCULAR | Status: AC
  Filled 2019-06-02: qty 2

## 2019-06-02 MED ORDER — METHYLENE BLUE 0.5 % INJ SOLN
INTRAVENOUS | Status: DC | PRN
  Administered 2019-06-02: 2 mL

## 2019-06-02 MED ORDER — LIDOCAINE-EPINEPHRINE (PF) 1 %-1:200000 IJ SOLN
INTRAMUSCULAR | Status: AC
  Filled 2019-06-02: qty 30

## 2019-06-02 MED ORDER — LACTATED RINGERS IV SOLN
INTRAVENOUS | Status: DC | PRN
  Administered 2019-06-02 (×2): via INTRAVENOUS

## 2019-06-02 SURGICAL SUPPLY — 53 items
BLADE 15 SAFETY STRL DISP (BLADE) ×2 IMPLANT
CANISTER SUCT 3000ML PPV (MISCELLANEOUS) ×2 IMPLANT
CLIP LIGATING EXTRA MED SLVR (CLIP) ×2 IMPLANT
CONT SPEC STER OR (MISCELLANEOUS) ×10 IMPLANT
COUNTER NEEDLE 20 DBL MAG RED (NEEDLE) ×2 IMPLANT
COVER SURGICAL LIGHT HANDLE (MISCELLANEOUS) ×4 IMPLANT
COVER WAND RF STERILE (DRAPES) ×2 IMPLANT
DECANTER SPIKE VIAL GLASS SM (MISCELLANEOUS) ×2 IMPLANT
DERMABOND ADVANCED (GAUZE/BANDAGES/DRESSINGS) ×1
DERMABOND ADVANCED .7 DNX12 (GAUZE/BANDAGES/DRESSINGS) ×1 IMPLANT
DRAPE LAPAROSCOPIC ABDOMINAL (DRAPES) ×2 IMPLANT
DRAPE UTILITY XL STRL (DRAPES) ×4 IMPLANT
DRSG PAD ABDOMINAL 8X10 ST (GAUZE/BANDAGES/DRESSINGS) ×2 IMPLANT
ELECT CAUTERY BLADE 6.4 (BLADE) ×2 IMPLANT
ELECT REM PT RETURN 9FT ADLT (ELECTROSURGICAL) ×2
ELECTRODE REM PT RTRN 9FT ADLT (ELECTROSURGICAL) ×1 IMPLANT
GAUZE 4X4 16PLY RFD (DISPOSABLE) ×2 IMPLANT
GAUZE SPONGE 4X4 12PLY STRL (GAUZE/BANDAGES/DRESSINGS) ×2 IMPLANT
GAUZE SPONGE 4X4 12PLY STRL LF (GAUZE/BANDAGES/DRESSINGS) ×4 IMPLANT
GLOVE BIO SURGEON STRL SZ 6.5 (GLOVE) ×10 IMPLANT
GLOVE BIOGEL PI IND STRL 6.5 (GLOVE) ×2 IMPLANT
GLOVE BIOGEL PI IND STRL 7.0 (GLOVE) ×3 IMPLANT
GLOVE BIOGEL PI INDICATOR 6.5 (GLOVE) ×2
GLOVE BIOGEL PI INDICATOR 7.0 (GLOVE) ×3
GOWN STRL REUS W/ TWL LRG LVL3 (GOWN DISPOSABLE) ×2 IMPLANT
GOWN STRL REUS W/TWL 2XL LVL3 (GOWN DISPOSABLE) ×4 IMPLANT
GOWN STRL REUS W/TWL LRG LVL3 (GOWN DISPOSABLE) ×2
KIT BASIN OR (CUSTOM PROCEDURE TRAY) ×2 IMPLANT
KIT TURNOVER KIT B (KITS) ×2 IMPLANT
MARKER SKIN DUAL TIP RULER LAB (MISCELLANEOUS) ×2 IMPLANT
NEEDLE HYPO 25GX1X1/2 BEV (NEEDLE) ×2 IMPLANT
NS IRRIG 1000ML POUR BTL (IV SOLUTION) ×2 IMPLANT
PACK GENERAL/GYN (CUSTOM PROCEDURE TRAY) ×2 IMPLANT
PACK LITHOTOMY IV (CUSTOM PROCEDURE TRAY) ×2 IMPLANT
PAD ABD 7.5X8 STRL (GAUZE/BANDAGES/DRESSINGS) ×2 IMPLANT
PAD ABD 8X10 STRL (GAUZE/BANDAGES/DRESSINGS) ×2 IMPLANT
PAD ARMBOARD 7.5X6 YLW CONV (MISCELLANEOUS) ×2 IMPLANT
PENCIL SMOKE EVACUATOR (MISCELLANEOUS) ×2 IMPLANT
SURGILUBE 2OZ TUBE FLIPTOP (MISCELLANEOUS) ×2 IMPLANT
SUT CHROMIC 2 0 SH (SUTURE) IMPLANT
SUT CHROMIC 3 0 SH 27 (SUTURE) ×2 IMPLANT
SUT MNCRL AB 4-0 PS2 18 (SUTURE) ×6 IMPLANT
SUT VIC AB 2-0 SH 27 (SUTURE) ×2
SUT VIC AB 2-0 SH 27XBRD (SUTURE) ×2 IMPLANT
SUT VIC AB 3-0 SH 8-18 (SUTURE) ×4 IMPLANT
SYR BULB 3OZ (MISCELLANEOUS) ×2 IMPLANT
SYR CONTROL 10ML LL (SYRINGE) ×4 IMPLANT
TOWEL GREEN STERILE (TOWEL DISPOSABLE) ×2 IMPLANT
TOWEL GREEN STERILE FF (TOWEL DISPOSABLE) ×2 IMPLANT
TRAY PROCTOSCOPIC FIBER OPTIC (SET/KITS/TRAYS/PACK) IMPLANT
TUBE CONNECTING 12X1/4 (SUCTIONS) ×2 IMPLANT
UNDERPAD 30X30 (UNDERPADS AND DIAPERS) ×2 IMPLANT
YANKAUER SUCT BULB TIP NO VENT (SUCTIONS) ×2 IMPLANT

## 2019-06-02 NOTE — Interval H&P Note (Signed)
History and Physical Interval Note:  06/02/2019 9:43 AM  Angela Carpenter  has presented today for surgery, with the diagnosis of ANAL MELANOMA.  The various methods of treatment have been discussed with the patient and family. After consideration of risks, benefits and other options for treatment, the patient has consented to  Procedure(s): ANAL MELANOMA EXCISION WITH SENTINEL LYMPH NODE BIOPSY (N/A) as a surgical intervention.  The patient's history has been reviewed, patient examined, no change in status, stable for surgery.  I have reviewed the patient's chart and labs.  Questions were answered to the patient's satisfaction.     Stark Klein

## 2019-06-02 NOTE — Transfer of Care (Signed)
Immediate Anesthesia Transfer of Care Note  Patient: Angela Carpenter  Procedure(s) Performed: Left lateral ANAL MELANOMA EXCISION WITH Bilateral Groin exploration and SENTINEL node mapping (N/A Rectum)  Patient Location: PACU  Anesthesia Type:General  Level of Consciousness: awake and alert   Airway & Oxygen Therapy: Patient Spontanous Breathing  Post-op Assessment: Report given to RN and Post -op Vital signs reviewed and stable  Post vital signs: Reviewed and stable  Last Vitals:  Vitals Value Taken Time  BP 113/84 06/02/19 1222  Temp 36.5 C 06/02/19 1222  Pulse 78 06/02/19 1230  Resp 13 06/02/19 1230  SpO2 96 % 06/02/19 1230  Vitals shown include unvalidated device data.  Last Pain:  Vitals:   06/02/19 1222  TempSrc:   PainSc: 0-No pain      Patients Stated Pain Goal: 3 ([id number])  Complications: No apparent anesthesia complications

## 2019-06-02 NOTE — Op Note (Signed)
PRE-OPERATIVE DIAGNOSIS: anal melanoma, pT4b, Nx  POST-OPERATIVE DIAGNOSIS:  Same  PROCEDURE:  Procedure(s): Sentinel lymph node mapping, bilateral groin exploration  SURGEON:  Surgeon(s): Stark Klein, MD  ASSIST: [name], MD  ANESTHESIA:   local and general  DRAINS: none   LOCAL MEDICATIONS USED:  BUPIVICAINE  and LIDOCAINE   SPECIMEN:  No Specimen  DISPOSITION OF SPECIMEN:  N/A  COUNTS:  One monocryl needle was lost after the perianal wound was already closed.  X ray formal read pending, no needle seen by surgeon on film.    DICTATION: IT trainer Dictation  PLAN OF CARE: Discharge to home after PACU  PATIENT DISPOSITION:  PACU - hemodynamically stable.  FINDINGS:  Signal deep under inguinal ligament  EBL: min  PROCEDURE:  While patient was in lithotomy for Dr. Manon Hilding procedure, the site for wide local excision was marked out.  The blue dye was injected intradermally.  Dr. Marcello Moores did the wide local excision which she will dictate.    The patient was placed back supine and both groins and upper thighs were prepped and draped sterilely.  Time out was performed again.    The left groin was addressed first.  The point of maximal signal intensity was identified.  The skin was anesthetized and a 3 cm oblique incision was created.  The subcutaneous tissue was divided with the cautery.  The probe was used to locate the point of maximal intensity.  This was a bit medial.  The inguinal ligament was exposed.  The signal was still deep.  The inguinal ligament was opened and the deeper tissue was elevated.  The inguinal ligament was closed with running 2-0 vicryl.    This was also palpated and no lymph nodes were palpated.  The signal was still deep in the pelvis and not superficial enough to locate.    The right groin was addressed similarly and no hot or blue nodes were able to be located.  The signal remained deep in the iliac chain.    Both incisions were closed with  interrupted 3-0 vicryl deep dermal suture and 4-0 monocryl running subcuticular suture.  They were then dressed with dermabond.    The patient was then placed in mesh underwear.  X ray was taken to look for needle.  She was allowed to emerge from general anesthesia and was taken to the pacu in stable condition.

## 2019-06-02 NOTE — Discharge Instructions (Addendum)
Beginning the day after surgery: ? ?You may sit in a tub of warm water 2-3 times a day to relieve discomfort. ? ?Eat a regular diet high in fiber.  Avoid foods that give you constipation or diarrhea.  Avoid foods that are difficult to digest, such as seeds, nuts, corn or popcorn. ? ?Do not go any longer than 2 days without a bowel movement.  You may take a dose of Milk of Magnesia if you become constipated.   ? ?Drink 6-8 glasses of water daily. ? ?Walking is encouraged.  Avoid strenuous activity and heavy lifting for one month after surgery.   ? ?Call the office if you have any questions or concerns.  Call immediately if you develop: ? ?Excessive rectal bleeding (more than a cup or passing large clots) ?Increased discomfort ?Fever greater than 100 F ?Difficulty urinating  ?

## 2019-06-02 NOTE — Anesthesia Procedure Notes (Signed)
Procedure Name: LMA Insertion Date/Time: 06/02/2019 10:17 AM Performed by: Eligha Bridegroom, CRNA Pre-anesthesia Checklist: Patient identified, Emergency Drugs available, Suction available, Patient being monitored and Timeout performed Patient Re-evaluated:Patient Re-evaluated prior to induction Oxygen Delivery Method: Circle system utilized Preoxygenation: Pre-oxygenation with 100% oxygen Induction Type: IV induction LMA: LMA inserted LMA Size: 4.0 Number of attempts: 1 Placement Confirmation: positive ETCO2 and breath sounds checked- equal and bilateral Tube secured with: Tape Dental Injury: Teeth and Oropharynx as per pre-operative assessment

## 2019-06-02 NOTE — H&P (Signed)
The patient is a 79 year old female who presents with hemorrhoids. 78 year old female who presents to the office for evaluation of a "hemorrhoid". She states that she has noticed this since December 2019. She reports swelling and mucosal drainage, as well as inability to keep the area clean. She occasionally notices some contact bleeding. Denies any bleeding with bowel movements. Her last colonoscopy was 3 years ago and normal. She underwent a excision of the mass in the office. Pathology was consistent with anal melanoma.   Problem List/Past Medical [name], MD; 123456 11:07 AM) MELANOMA OF ANUS (C21.0)  Past Surgical History [name], MD; 123456 11:07 AM) Breast Augmentation Bilateral. Breast Biopsy Right. Breast Mass; Local Excision Right. Cataract Surgery Right. Colon Polyp Removal - Colonoscopy Sentinel Lymph Node Biopsy  Diagnostic Studies History [name], MD; 123456 11:07 AM) Colonoscopy 1-5 years ago Mammogram within last year Pap Smear 1-5 years ago  Allergies Mammie Lorenzo, LPN; 579FGE D34-534 AM) No Known Drug Allergies [04/05/2019]:  Medication History Mammie Lorenzo, LPN; 579FGE D34-534 AM) Raloxifene HCl (60MG  Tablet, Oral) Active. Toprol XL (25MG  Tablet ER 24HR, Oral two times daily) Active. Atorvastatin Calcium (10MG  Tablet, Oral) Active. Timolol Maleate (0.5% Solution, Ophthalmic two times daily) Active. Alendronate Sodium (70MG  Tablet, Oral weekly) Active. Eliquis (5MG  Tablet, Oral) Active. PreserVision AREDS 2 (Oral two times daily) Active. Docusate Sodium (100MG  Capsule, Oral) Active. Vitamin D3 Complete (2000 IU Oral daily) Active. Medications Reconciled  Social History [name], MD; 123456 11:07 AM) Alcohol use Remotely quit alcohol use. Caffeine use Carbonated beverages, Coffee, Tea. No drug use Tobacco use Former smoker.  Family History [name], MD; 123456 11:07  AM) Alcohol Abuse Mother. Depression Brother, Mother, Son. Heart disease in female family member before age 7 Malignant Neoplasm Of Pancreas Father. Respiratory Condition Mother.  Pregnancy / Birth History [name], MD; 123456 11:07 AM) Age at menarche 66 years. Age of menopause 51-50 Gravida 3 Length (months) of breastfeeding 12-24 Maternal age 36-30 Regular periods  Other Problems [name], MD; 123456 11:07 AM) Breast Cancer Cerebrovascular Accident Hemorrhoids Lump In Breast Melanoma     Review of Systems  General Not Present- Appetite Loss, Chills, Fatigue, Fever, Night Sweats, Weight Gain and Weight Loss. Skin Not Present- Change in Wart/Mole, Dryness, Hives, Jaundice, New Lesions, Non-Healing Wounds, Rash and Ulcer. HEENT Present- Seasonal Allergies. Not Present- Earache, Hearing Loss, Hoarseness, Nose Bleed, Oral Ulcers, Ringing in the Ears, Sinus Pain, Sore Throat, Visual Disturbances, Wears glasses/contact lenses and Yellow Eyes. Respiratory Not Present- Bloody sputum, Chronic Cough, Difficulty Breathing, Snoring and Wheezing. Breast Not Present- Breast Mass, Breast Pain, Nipple Discharge and Skin Changes. Cardiovascular Not Present- Chest Pain, Difficulty Breathing Lying Down, Leg Cramps, Palpitations, Rapid Heart Rate, Shortness of Breath and Swelling of Extremities. Gastrointestinal Present- Hemorrhoids. Not Present- Abdominal Pain, Bloating, Bloody Stool, Change in Bowel Habits, Chronic diarrhea, Constipation, Difficulty Swallowing, Excessive gas, Gets full quickly at meals, Indigestion, Nausea, Rectal Pain and Vomiting. Female Genitourinary Not Present- Frequency, Nocturia, Painful Urination, Pelvic Pain and Urgency. Musculoskeletal Not Present- Back Pain, Joint Pain, Joint Stiffness, Muscle Pain, Muscle Weakness and Swelling of Extremities. Neurological Not Present- Decreased Memory, Fainting, Headaches, Numbness, Seizures,  Tingling, Tremor, Trouble walking and Weakness. Psychiatric Not Present- Anxiety, Bipolar, Change in Sleep Pattern, Depression, Fearful and Frequent crying. Endocrine Not Present- Cold Intolerance, Excessive Hunger, Hair Changes, Heat Intolerance, Hot flashes and New Diabetes. Hematology Present- Blood Thinners. Not Present- Easy Bruising, Excessive bleeding, Gland problems, HIV and Persistent Infections.  BP 132/72  Pulse 79   Temp 97.9 F (36.6 C) (Oral)   Ht 5\' 5"  (1.651 m)   Wt 59.4 kg   SpO2 100%   BMI 21.80 kg/m     Physical Exam   General Mental Status-Alert. General Appearance-Not in acute distress. Build & Nutrition-Well nourished. Posture-Normal posture. Gait-Normal.  Head and Neck Head-normocephalic, atraumatic with no lesions or palpable masses. Trachea-midline.  Chest and Lung Exam Chest and lung exam reveals -on auscultation, normal breath sounds, no adventitious sounds and normal vocal resonance.  Cardiovascular Cardiovascular examination reveals -normal heart sounds, regular rate and rhythm with no murmurs and no digital clubbing, cyanosis, edema, increased warmth or tenderness.  Abdomen Inspection Inspection of the abdomen reveals - No Hernias. Palpation/Percussion Palpation and Percussion of the abdomen reveal - Soft, Non Tender, No Rigidity (guarding), No hepatosplenomegaly and No Palpable abdominal masses.  Neurologic Neurologic evaluation reveals -alert and oriented x 3 with no impairment of recent or remote memory, normal attention span and ability to concentrate, normal sensation and normal coordination.  Musculoskeletal Normal Exam - Bilateral-Upper Extremity Strength Normal and Lower Extremity Strength Normal.   Path: Malignant melanoma Breslow's depth: 1.6 cm. Clark's level: IV Lateral margins unclear, deep margin 0.5 mm Mitotic index 11/mm2 Lymphovascular invasion present T4B, NX, MX   Assessment  & Plan   MELANOMA OF ANUS (C21.0) Impression: 78 year old female who presents to the office for evaluation after excision of a large anal polyp. This showed anal melanoma. I have recommended reexcision of her surgical scar with sentinel lymph node biopsy. I will have Dr. Barry Dienes assist me with this. We have gotten lymphoscintigraphy, which shows 2 sentinel lymph nodes in the right side and one in the left. Patient understands that she may have bilateral groin incisions. We discussed possible extended stay after surgery. We also discussed the possibility of discharge the same day, depending on the straightforwardness of the surgery. Risks include pain, bleeding and recurrence. Risk of sentinel lymph node excision include infection, seroma and lymphedema. We discussed that all of these risks are relatively small. Patient has a history of breast cancer and is familiar with the process.

## 2019-06-02 NOTE — Anesthesia Postprocedure Evaluation (Signed)
Anesthesia Post Note  Patient: Angela Carpenter  Procedure(s) Performed: Left lateral ANAL MELANOMA EXCISION WITH Bilateral Groin exploration and SENTINEL node mapping (N/A Rectum)     Patient location during evaluation: PACU Anesthesia Type: General Level of consciousness: awake Pain management: pain level controlled Vital Signs Assessment: post-procedure vital signs reviewed and stable Respiratory status: spontaneous breathing Cardiovascular status: stable Postop Assessment: no apparent nausea or vomiting Anesthetic complications: no    Last Vitals:  Vitals:   06/02/19 1300 06/02/19 1307  BP:  119/64  Pulse: 81 73  Resp: 16 15  Temp: 36.5 C   SpO2: 98% 98%    Last Pain:  Vitals:   06/02/19 1300  TempSrc:   PainSc: 0-No pain                 Dory Demont

## 2019-06-02 NOTE — Op Note (Addendum)
06/02/2019  12:01 PM  PATIENT:  Angela Carpenter  78 y.o. female  Patient Care Team: Tisovec, [name], MD as PCP - General (Internal Medicine) [name], MD as PCP - Cardiology (Cardiology)  PRE-OPERATIVE DIAGNOSIS:  ANAL MELANOMA  POST-OPERATIVE DIAGNOSIS:  ANAL MELANOMA  PROCEDURE:    PERIANAL MELANOMA RE-EXCISION    Surgeon(s): [name] MD  ASSISTANT: none   ANESTHESIA:   local and MAC  SPECIMEN:  Source of Specimen:  perianal melanoma scar  DISPOSITION OF SPECIMEN:  PATHOLOGY  COUNTS:  NO Needle fell off needle driver on back table.  Could not be located.(AXR obtained)  PLAN OF CARE: Discharge to home after PACU  PATIENT DISPOSITION:  PACU - hemodynamically stable.  INDICATION: 78 y.o. F with pedunculated polyp excised for biopsy in the office. Pathology showed malignant melanoma with unclear/close margins.  OR FINDINGS: L lateral perianal scar  DESCRIPTION: the patient was identified in the preoperative holding area and taken to the OR where they were laid on the operating room table.  MAC/LMA anesthesia was induced without difficulty. The patient was then positioned in prone jackknife position with buttocks gently taped apart.  The patient was then prepped and draped in usual sterile fashion.  SCDs were noted to be in place prior to the initiation of anesthesia. A surgical timeout was performed indicating the correct patient, procedure, positioning and need for preoperative antibiotics.  A rectal block was performed using Marcaine with epinephrine.    I began with a digital rectal exam.  There were no masses noted.  I then placed a Hill-Ferguson anoscope into the anal canal and evaluated this completely.  There was a small L lateral scar.  24m margins were marked.  A 5 mm margin was marked towards the anal canal.   Methylene Blue was injected within this area intradermally. An elliptical incision was taken.  The dermal layer was removed with sharp  dissection.  I then closed the anoderm with interrupted 3-0 Chromic sutures and the perianal skin was closed with interrupted 4-0 Monocryl sutures.  A sterile dressing was applied.  I then turned the case over the Dr BBarry Dienesto perform the groin exploration for sentinel lymph nodes.

## 2019-06-03 ENCOUNTER — Encounter (HOSPITAL_COMMUNITY): Payer: Self-pay | Admitting: General Surgery

## 2019-06-04 ENCOUNTER — Other Ambulatory Visit: Payer: Self-pay | Admitting: General Surgery

## 2019-06-04 DIAGNOSIS — C210 Malignant neoplasm of anus, unspecified: Secondary | ICD-10-CM

## 2019-06-09 ENCOUNTER — Telehealth: Payer: Self-pay | Admitting: Oncology

## 2019-06-09 NOTE — Telephone Encounter (Signed)
Received a new patient referral from Dr. Barry Dienes fora dx: melanoma of anus. Mrs. Angela Carpenter has been cld and scheduled to see Dr. Alen Blew on 9/11 at 2pm. She has been made aware to arrive 20 minutes early. I provided the address and location to the pt.

## 2019-06-18 ENCOUNTER — Other Ambulatory Visit: Payer: Self-pay

## 2019-06-18 ENCOUNTER — Inpatient Hospital Stay: Payer: Medicare Other | Attending: Oncology | Admitting: Oncology

## 2019-06-18 VITALS — BP 134/61 | HR 69 | Temp 98.7°F | Resp 17 | Ht 65.0 in | Wt 132.0 lb

## 2019-06-18 DIAGNOSIS — C211 Malignant neoplasm of anal canal: Secondary | ICD-10-CM | POA: Diagnosis not present

## 2019-06-18 DIAGNOSIS — Z7901 Long term (current) use of anticoagulants: Secondary | ICD-10-CM

## 2019-06-18 DIAGNOSIS — Z801 Family history of malignant neoplasm of trachea, bronchus and lung: Secondary | ICD-10-CM | POA: Diagnosis not present

## 2019-06-18 DIAGNOSIS — Z923 Personal history of irradiation: Secondary | ICD-10-CM

## 2019-06-18 DIAGNOSIS — E785 Hyperlipidemia, unspecified: Secondary | ICD-10-CM

## 2019-06-18 DIAGNOSIS — Z9221 Personal history of antineoplastic chemotherapy: Secondary | ICD-10-CM | POA: Diagnosis not present

## 2019-06-18 DIAGNOSIS — Z800 Family history of malignant neoplasm of digestive organs: Secondary | ICD-10-CM

## 2019-06-18 DIAGNOSIS — Z853 Personal history of malignant neoplasm of breast: Secondary | ICD-10-CM | POA: Diagnosis not present

## 2019-06-18 DIAGNOSIS — Z8051 Family history of malignant neoplasm of kidney: Secondary | ICD-10-CM

## 2019-06-18 DIAGNOSIS — Z803 Family history of malignant neoplasm of breast: Secondary | ICD-10-CM | POA: Diagnosis not present

## 2019-06-18 DIAGNOSIS — Z79899 Other long term (current) drug therapy: Secondary | ICD-10-CM | POA: Diagnosis not present

## 2019-06-18 NOTE — Progress Notes (Signed)
Reason for the request: Anal melanoma     HPI: I was asked by Dr. Barry Dienes to evaluate Angela Carpenter for the evaluation of anal melanoma.  She is a 78 year old woman with history of breast cancer diagnosed in 2006.  She was living in [place] at the time and had surgical resection followed by radiation and chemotherapy.  She also had superficial melanoma that was resected from her arm.  She started noticing an anal mass suspicious for hemorrhoid in December 2019.  Which she has observed for a period of time.  She subsequently was evaluated by Dr. Marcello Moores and an excision done in the office showed anal melanoma.  He subsequently underwent perianal excision in the operating room done on 06/02/2019.  Eval pathology showed no residual melanoma at that time.  Had attempted the sentinel lymph node mapping was not successful by Dr. Barry Dienes.  She tolerated the procedure well without any postoperative pain or complications.  She is scheduled to have PET scan in the next few weeks.  She has no other complaints at this time and has had good dermatology follow-up periodically but not as of late.  He denies any hematochezia, melena or pelvic discomfort.  He had no pelvic adenopathy.  She does not report any headaches, blurry vision, syncope or seizures. Does not report any fevers, chills or sweats.  Does not report any cough, wheezing or hemoptysis.  Does not report any chest pain, palpitation, orthopnea or leg edema.  Does not report any nausea, vomiting or abdominal pain.  Does not report any constipation or diarrhea.  Does not report any skeletal complaints.    Does not report frequency, urgency or hematuria.  Does not report any skin rashes or lesions. Does not report any heat or cold intolerance.  Does not report any lymphadenopathy or petechiae.  Does not report any anxiety or depression.  Remaining review of systems is negative.    Past Medical History:  Diagnosis Date  . A-fib ([place])   . Atrial fibrillation  ([place])    4 years ago started  . Breast cancer ([place]) 2006  . Colon polyps   . Glaucoma   . Hypercholesteremia   . Hypertension    patient denies   . Osteoporosis   . Personal history of chemotherapy 2006  . Personal history of radiation therapy 2006  . Stroke (cerebrum) ([place]) 2015  :  Past Surgical History:  Procedure Laterality Date  . AUGMENTATION MAMMAPLASTY Bilateral 2006   Patient had them removed in 2008  . BREAST BIOPSY Left 2016  . BREAST LUMPECTOMY Right 2006  . COLONOSCOPY     had polyps  . EYE SURGERY Right    cataract  . MELANOMA EXCISION WITH SENTINEL LYMPH NODE BIOPSY N/A 06/02/2019   Procedure: Left lateral ANAL MELANOMA EXCISION WITH Bilateral Groin exploration and SENTINEL node mapping;  Surgeon: [name], MD;  Location: [place] [facility];  Service: General;  Laterality: N/A;  . TONSILLECTOMY    :   Current Outpatient Medications:  .  alendronate (FOSAMAX) 70 MG tablet, Take 70 mg by mouth once a week. Take with a full glass of water on an empty stomach., Disp: , Rfl:  .  atorvastatin (LIPITOR) 10 MG tablet, TAKE 1 TABLET DAILY, Disp: 90 tablet, Rfl: 2 .  Cholecalciferol (VITAMIN D-3) 125 MCG (5000 UT) TABS, Take 5,000 Units by mouth daily. , Disp: , Rfl:  .  docusate sodium (COLACE) 100 MG capsule, Take 100 mg by mouth every evening., Disp: ,  Rfl:  .  ELIQUIS 5 MG TABS tablet, TAKE 1 TABLET TWICE A DAY, Disp: 180 tablet, Rfl: 1 .  Multiple Vitamins-Minerals (PRESERVISION AREDS 2+MULTI VIT) CAPS, Take 1 capsule by mouth 2 (two) times daily., Disp: , Rfl:  .  raloxifene (EVISTA) 60 MG tablet, Take 60 mg by mouth daily., Disp: , Rfl:  .  timolol (TIMOPTIC) 0.5 % ophthalmic solution, Place 1 drop into both eyes 2 (two) times daily., Disp: , Rfl:  .  TOPROL XL 25 MG 24 hr tablet, TAKE 1 TABLET TWICE A DAY, Disp: 180 tablet, Rfl: 2 .  traMADol (ULTRAM) 50 MG tablet, Take 1 tablet (50 mg total) by mouth every 6 (six) hours as needed., Disp: 15 tablet, Rfl: 0  Current  Facility-Administered Medications:  .  0.9 %  sodium chloride infusion, 500 mL, Intravenous, Continuous, Danis, Estill Cotta III, MD:  No Known Allergies:  Family History  Problem Relation Age of Onset  . Lung cancer Mother   . Hypotension Mother   . Pancreatic cancer Father   . Hypertension Sister   . Kidney cancer Brother   . Cirrhosis Maternal Grandfather   . Breast cancer Paternal Grandmother   . Cirrhosis Paternal Grandfather   . Breast cancer Paternal Aunt   :  Social History   Socioeconomic History  . Marital status: Married    Spouse name: Not on file  . Number of children: 1  . Years of education: Not on file  . Highest education level: Not on file  Occupational History  . Occupation: retired  Scientific laboratory technician  . Financial resource strain: Not on file  . Food insecurity    Worry: Not on file    Inability: Not on file  . Transportation needs    Medical: Not on file    Non-medical: Not on file  Tobacco Use  . Smoking status: Passive Smoke Exposure - Never Smoker  . Smokeless tobacco: Never Used  . Tobacco comment: smoked in college but didn't inhale  Substance and Sexual Activity  . Alcohol use: No    Alcohol/week: 0.0 standard drinks  . Drug use: No  . Sexual activity: Not on file  Lifestyle  . Physical activity    Days per week: Not on file    Minutes per session: Not on file  . Stress: Not on file  Relationships  . Social Herbalist on phone: Not on file    Gets together: Not on file    Attends religious service: Not on file    Active member of club or organization: Not on file    Attends meetings of clubs or organizations: Not on file    Relationship status: Not on file  . Intimate partner violence    Fear of current or ex partner: Not on file    Emotionally abused: Not on file    Physically abused: Not on file    Forced sexual activity: Not on file  Other Topics Concern  . Not on file  Social History Narrative  . Not on file   :  Pertinent items are noted in HPI.  Exam: Blood pressure 134/61, pulse 69, temperature 98.7 F (37.1 C), temperature source Oral, resp. rate 17, height 5\' 5"  (1.651 m), weight 132 lb (59.9 kg), SpO2 100 %.  ECOG 0 General appearance: alert and cooperative appeared without distress. Head: atraumatic without any abnormalities. Eyes: conjunctivae/corneas clear. PERRL.  Sclera anicteric. Throat: lips, mucosa, and tongue normal; without oral thrush  or ulcers. Resp: clear to auscultation bilaterally without rhonchi, wheezes or dullness to percussion. Cardio: regular rate and rhythm, S1, S2 normal, no murmur, click, rub or gallop GI: soft, non-tender; bowel sounds normal; no masses,  no organomegaly Skin: Skin color, texture, turgor normal. No rashes or lesions Lymph nodes: Cervical, supraclavicular, and axillary nodes normal. Neurologic: Grossly normal without any motor, sensory or deep tendon reflexes. Musculoskeletal: No joint deformity or effusion.  CBC    Component Value Date/Time   WBC 9.0 05/26/2019 1031   RBC 4.71 05/26/2019 1031   HGB 14.3 05/26/2019 1031   HGB 13.6 12/05/2016 0750   HCT 44.4 05/26/2019 1031   HCT 41.1 12/05/2016 0750   PLT 290 05/26/2019 1031   PLT 301 12/05/2016 0750   MCV 94.3 05/26/2019 1031   MCV 90 12/05/2016 0750   MCH 30.4 05/26/2019 1031   MCHC 32.2 05/26/2019 1031   RDW 13.5 05/26/2019 1031   RDW 13.4 12/05/2016 0750   LYMPHSABS 2.1 12/05/2016 0750   MONOABS 0.6 09/05/2015 0828   EOSABS 0.3 12/05/2016 0750   BASOSABS 0.1 12/05/2016 0750     Chemistry      Component Value Date/Time   NA 137 05/26/2019 1031   NA 138 12/05/2016 0750   K 4.5 05/26/2019 1031   CL 103 05/26/2019 1031   CO2 26 05/26/2019 1031   BUN 15 05/26/2019 1031   BUN 15 12/05/2016 0750   CREATININE 0.65 05/26/2019 1031   CREATININE 0.70 09/05/2015 0828      Component Value Date/Time   CALCIUM 9.3 05/26/2019 1031   ALKPHOS 71 12/05/2016 0750   AST 25 12/05/2016  0750   ALT 21 12/05/2016 0750   BILITOT 0.3 12/05/2016 0750       Dg Pelvis 1-2 Views  Result Date: 06/02/2019 CLINICAL DATA:  Missing needle.  Melanoma. EXAM: PELVIS - 1-2 VIEW COMPARISON:  None. FINDINGS: Frontal view obtained. No radiopaque foreign body. In particular, no needle evident. No fracture or dislocation. No blastic or lytic bone lesions. No evident arthropathy. IMPRESSION: No retained needle evident. Bony structures appear intact. No fracture or dislocation. No blastic or lytic bone lesions. No appreciable arthropathy. Electronically Signed   By: Lowella Grip III M.D.   On: 06/02/2019 12:11   Nm Sentinel Node Inj-no Rpt (melanoma)  Result Date: 06/02/2019 Sulfur colloid was injected by the nuclear medicine technologist for melanoma sentinel node.    Assessment and Plan:   78 year old with:  1.  Anal melanoma diagnosed in August 2020.  She presented with polypoid mass at the anus that has developed since December 2019.  She is status post perianal excision completed on June 02, 2019 without any residual tumor.  The natural course of this disease and treatment options were reviewed.  Local therapy as well as systemic therapy options were outlined today.  At this time, it appears that her tumor has been resected locally without any additional surgery planned at this time.  Active surveillance and use of systemic therapy would be an option if she develops recurrent disease.  PET scan is pending at this time which is will be scheduled in the near future.  The plan is to continue with active surveillance with repeat imaging studies periodically at least every 6 months for the first year.  The risk of developing systemic disease remains high at this time and the role of immunotherapy was reiterated in case she does so.  At this time, there is no indication for treatment unless  she has either advanced disease or local regional recurrence.  2.  Dermatology surveillance: I urged  her to establish care with dermatology for skin examination periodically given her risk of melanoma of 2 separate occasions.  3.   Follow-up: We will be in the near future after her PET scan and periodically after that.  40  minutes was spent with the patient face-to-face today.  More than 50% of time was spent on updating her disease status, reviewing treatment options and answering questions regarding future plan of care.   Thank you for the referral.  I had the pleasure of meeting this patient today.  A copy of this consult has been forwarded to the requesting physician.

## 2019-06-21 ENCOUNTER — Telehealth: Payer: Self-pay | Admitting: Oncology

## 2019-07-01 DIAGNOSIS — Z23 Encounter for immunization: Secondary | ICD-10-CM | POA: Diagnosis not present

## 2019-07-16 ENCOUNTER — Encounter (HOSPITAL_COMMUNITY)
Admission: RE | Admit: 2019-07-16 | Discharge: 2019-07-16 | Disposition: A | Discharge status: Home / Self Care | Payer: Medicare Other | Source: Ambulatory Visit | Attending: General Surgery | Admitting: General Surgery

## 2019-07-16 ENCOUNTER — Other Ambulatory Visit: Payer: Self-pay

## 2019-07-16 DIAGNOSIS — Z853 Personal history of malignant neoplasm of breast: Secondary | ICD-10-CM | POA: Insufficient documentation

## 2019-07-16 DIAGNOSIS — Z7901 Long term (current) use of anticoagulants: Secondary | ICD-10-CM | POA: Diagnosis not present

## 2019-07-16 DIAGNOSIS — C210 Malignant neoplasm of anus, unspecified: Secondary | ICD-10-CM | POA: Diagnosis not present

## 2019-07-16 DIAGNOSIS — C4351 Malignant melanoma of anal skin: Secondary | ICD-10-CM | POA: Diagnosis not present

## 2019-07-16 DIAGNOSIS — C7982 Secondary malignant neoplasm of genital organs: Secondary | ICD-10-CM | POA: Diagnosis not present

## 2019-07-16 DIAGNOSIS — Z87891 Personal history of nicotine dependence: Secondary | ICD-10-CM | POA: Diagnosis not present

## 2019-07-16 DIAGNOSIS — Z79899 Other long term (current) drug therapy: Secondary | ICD-10-CM | POA: Insufficient documentation

## 2019-07-16 LAB — GLUCOSE, CAPILLARY: Glucose-Capillary: 90 mg/dL (ref 70–99)

## 2019-07-16 IMAGING — PT NM PET TUM IMG INITIAL (PI) WHOLE BODY
7 of 9 series · 18 of 25 positions shown · non-contrast
Comparison: None.

CLINICAL DATA: Initial treatment strategy for melanoma of the anus,
also with history of breast cancer.

EXAM:
NUCLEAR MEDICINE PET WHOLE BODY
TECHNIQUE: 6.5 mCi F-18 FDG was injected intravenously. Full-ring PET imaging
was performed from the skull base to thigh after the radiotracer. CT
data was obtained and used for attenuation correction and anatomic
localization.
Fasting blood glucose: 90 mg/dl

[Series 3: pet wb ac · axial · 5.0mm · 4.07mm/px · z∈[-293,+1527]mm · 4 of 456 slices shown]
[im 1/456]
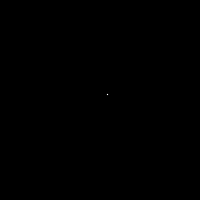
[im 228/456]
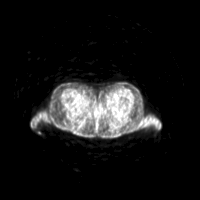
[im 342/456]
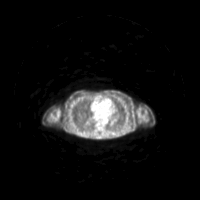
[im 456/456]
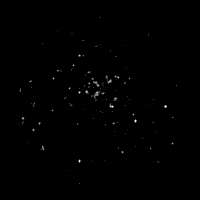

[Series 4: ct wb 5.0 hd_fov · axial · 5.0mm · 1.17mm/px · z∈[+159,+1527]mm · 3 of 457 slices shown]
[im 115/457  soft-tissue]
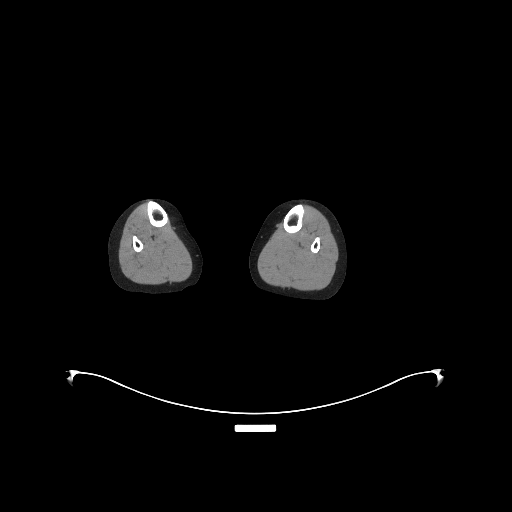
[im 229/457  soft-tissue]
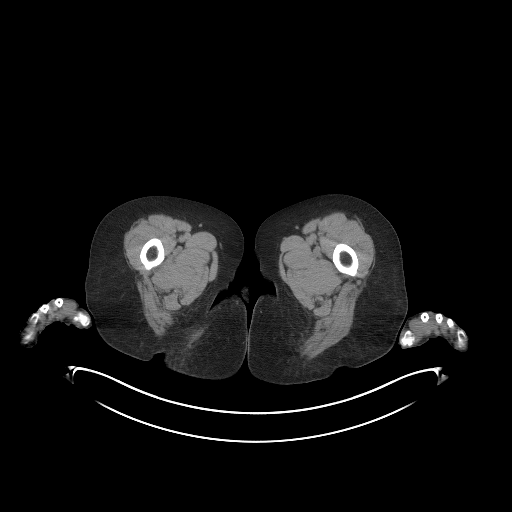
[im 457/457  soft-tissue]
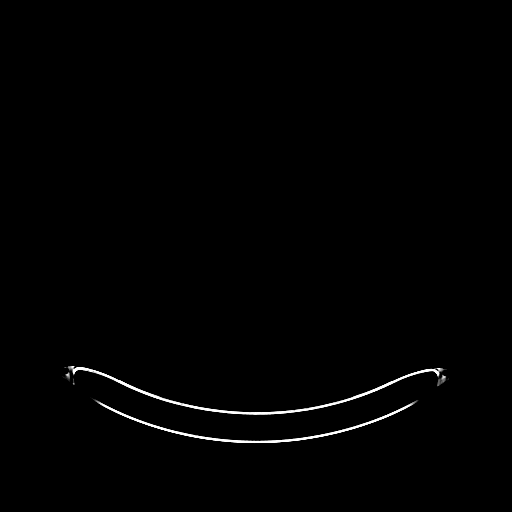

[Series 5: pet wb nac · axial · 5.0mm · 4.07mm/px · z∈[-285,+1527]mm · 4 of 454 slices shown]
[im 1/454  full-range]
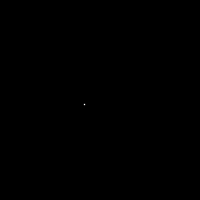
[im 114/454]
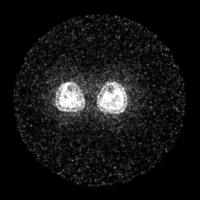
[im 340/454]
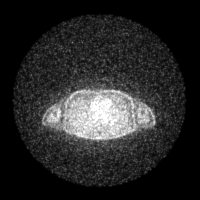
[im 454/454]
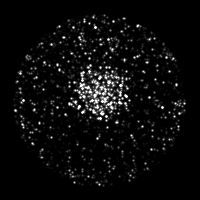

[Series 8: ct wb 5.0 b70f (id)_bone · axial · 5.0mm · 0.62mm/px · 1 of 73 slices shown]
[im 1/73  soft-tissue]
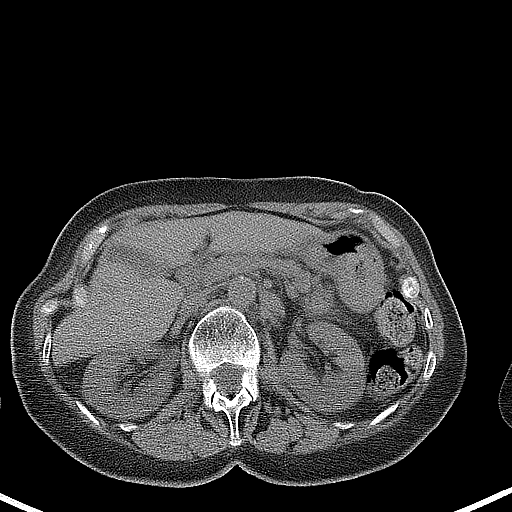

[Series 604: range-ct wb 5.0 hd_fov-cor-<alpha range> · 1 of 77 slices shown]
[im 1/77]
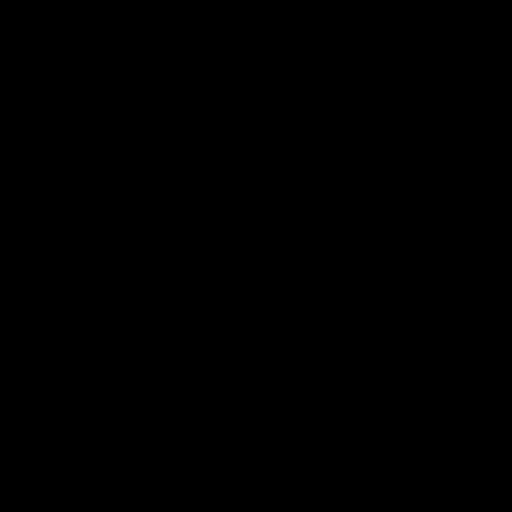

[Series 605: range-ct wb 5.0 hd_fov-tra-<alpha range> · 4 of 424 slices shown]
[im 1/424]
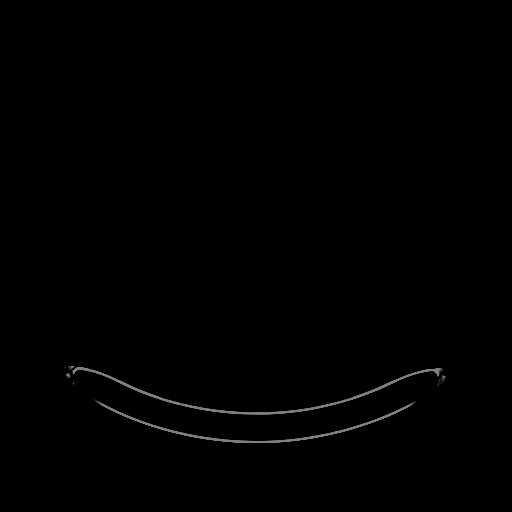
[im 212/424]
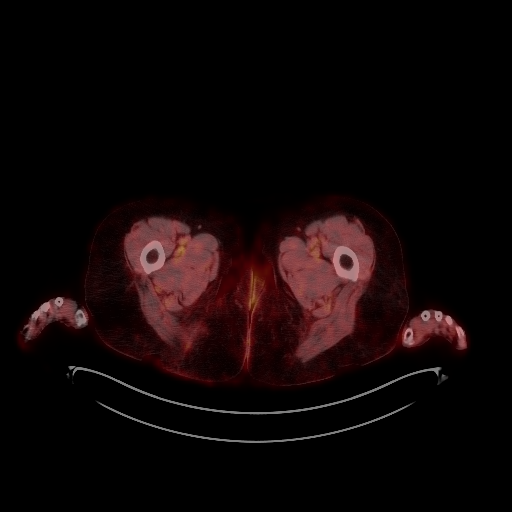
[im 318/424]
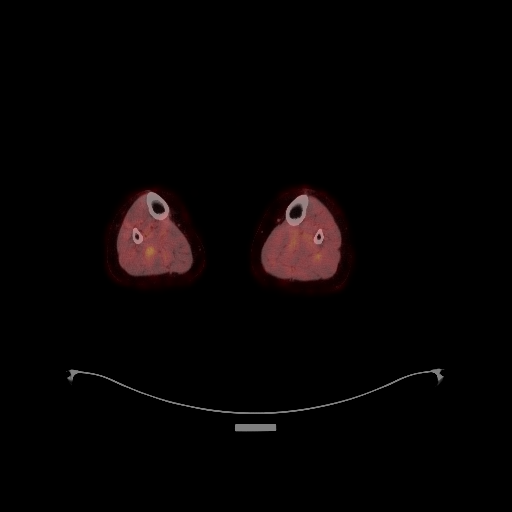
[im 424/424]
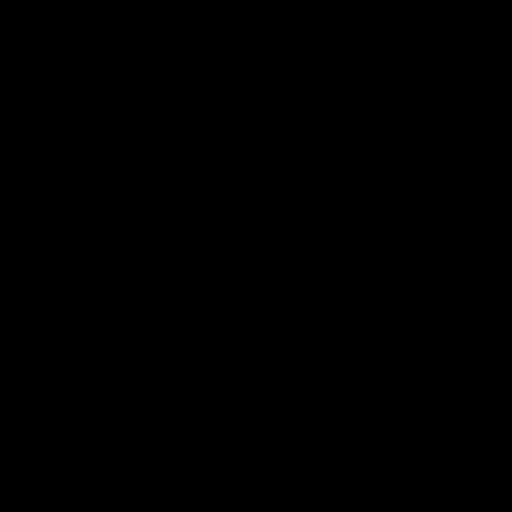

[Series 1178: results mm oncology reading · 3.0mm · 0.50mm/px · 1 of 1 slices shown]
[im 1/1]
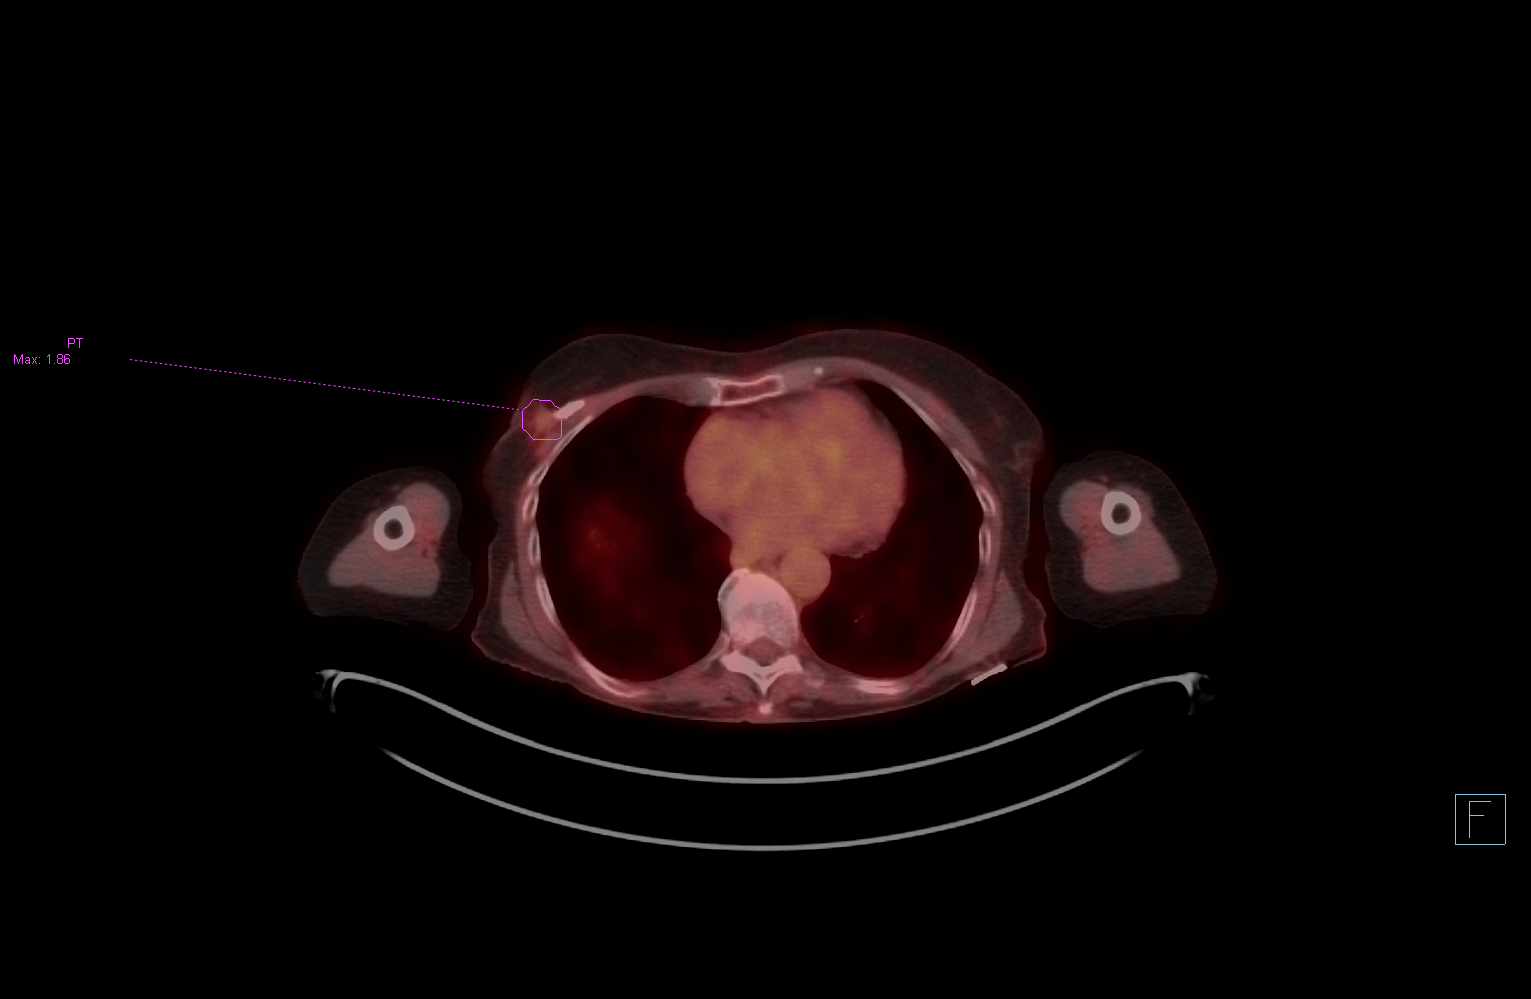

[18 of 25 positions shown; findings below may reference images not displayed]

FINDINGS: Mediastinal blood pool activity: SUV max

Liver activity: SUV max

HEAD/NECK: No hypermetabolic activity in the scalp. No
hypermetabolic cervical lymph nodes.

Incidental CT findings: No neck masses. Thyroid is normal on
noncontrast CT. Artifact from dental hardware is noted about the
mandible and face from maxillary dental hardware.

CHEST: No hypermetabolic mediastinal or hilar nodes. Lobulated left
lower lobe nodule or 2 adjacent nodules just above the left
hemidiaphragm measuring approximately 6 mm 4 mm for medial and
lateral aspects respectively period (SUVmax = 1.8) lesion is however
difficult to separate from the adjacent spleen.

Incidental CT findings: Calcified atherosclerotic changes throughout
the thoracic aorta. No signs aneurysm. No pericardial effusion.

No pleural effusion. No enlarged lymph nodes in the chest. Signs of
right breast lumpectomy with some areas of disc trophic
calcification.

ABDOMEN/PELVIS: No abnormal hypermetabolic activity within the
liver, pancreas, adrenal glands, or spleen.

Soft tissue nodule in the presacral space measures 7 mm with
increased FDG uptake (image 197, CT dataset) (SUVmax = 3.6)

Question of area of abnormal uptake in the low rectum at the
anorectal junction along the right lateral margin (SUVmax = 5.8)

A 0.7 x 1.6 cm left labial soft tissue nodule (image 219, CT
dataset) (SUVmax = 14.2)

Intramuscular lesion in the posterior left upper thigh measuring
x 1.9 cm (image 235, series 4) (SUVmax = 13)

Incidental CT findings: Non-contrast appearance of liver,
gallbladder, spleen and adrenal glands is normal.

No signs of hydronephrosis or renal lesion.

No signs of bowel obstruction or acute bowel process. Stool fills
much of the colon. The appendix is normal.

Calcific atherosclerotic changes throughout the abdominal aorta. No
retroperitoneal masses.

No pelvic mass aside from previously described pelvic abnormalities.
Urinary bladder with configuration suggesting pelvic floor
dysfunction. Subtle postoperative changes in the bilateral groin.

SKELETON: No focal hypermetabolic activity to suggest skeletal
metastasis.

Incidental CT findings: No signs of acute or destructive bone
process. Grade [DATE] anterolisthesis of L4 on L5. Multilevel
degenerative changes are seen throughout the spine worse in the
cervical and lumbar spine.

EXTREMITIES: Left posterior thigh metastatic lesion as discussed
above.

Incidental CT findings: Degenerative changes about the wrists and
knees.
IMPRESSION: 1. Signs of metastatic disease to the left labia and left posterior
thigh as well as soft tissue nodule in the presacral space just left
of midline in the pelvis.
2. Activity in the low rectum of the level of the inguinal rectal
junction suspicious for additional site of disease, direct
visualization is suggested.
3. Nonspecific small lobulated nodule versus 2 adjacent nodules in
the left lower lobe of of left hemidiaphragm. Follow-up chest CT may
be helpful given motion artifact. No overt hypermetabolism though
size and location may limit assessment.

## 2019-07-16 MED ORDER — FLUDEOXYGLUCOSE F - 18 (FDG) INJECTION
6.5000 | Freq: Once | INTRAVENOUS | Status: AC
  Administered 2019-07-16: 6.5 via INTRAVENOUS

## 2019-07-20 ENCOUNTER — Telehealth: Payer: Self-pay | Admitting: Oncology

## 2019-07-20 NOTE — Telephone Encounter (Signed)
Contacted patient to verify telephone visit for pre reg °

## 2019-07-21 ENCOUNTER — Inpatient Hospital Stay: Payer: Medicare Other | Attending: Oncology | Admitting: Oncology

## 2019-07-21 DIAGNOSIS — Z79899 Other long term (current) drug therapy: Secondary | ICD-10-CM | POA: Insufficient documentation

## 2019-07-21 DIAGNOSIS — C211 Malignant neoplasm of anal canal: Secondary | ICD-10-CM | POA: Diagnosis not present

## 2019-07-21 DIAGNOSIS — E039 Hypothyroidism, unspecified: Secondary | ICD-10-CM

## 2019-07-21 DIAGNOSIS — Z7901 Long term (current) use of anticoagulants: Secondary | ICD-10-CM | POA: Insufficient documentation

## 2019-07-21 DIAGNOSIS — Z5112 Encounter for antineoplastic immunotherapy: Secondary | ICD-10-CM | POA: Insufficient documentation

## 2019-07-21 MED ORDER — PROCHLORPERAZINE MALEATE 10 MG PO TABS: 10.0000 mg | ORAL_TABLET | Freq: Four times a day (QID) | ORAL | 0 refills | Status: DC | PRN

## 2019-07-21 NOTE — Progress Notes (Signed)
START ON PATHWAY REGIMEN - Melanoma and Other Skin Cancers     A cycle is 21 days:     Pembrolizumab   **Always confirm dose/schedule in your pharmacy ordering system**  Patient Characteristics: Melanoma, Distant Metastases or Unresectable Local Recurrence, Unresectable, Asymptomatic, First Line, BRAF V600 Wild Type / BRAF V600 Results Pending or Unknown Disease Classification: Melanoma Disease Subtype: Mucosal Therapeutic Status: Distant Metastases BRAF V600 Mutation Status: Awaiting BRAF V600 Results Metastatic Disease Type: Asymptomatic Line of Therapy: First Line Intent of Therapy: Non-Curative / Palliative Intent, Discussed with Patient

## 2019-07-21 NOTE — Progress Notes (Signed)
Hematology and Oncology Follow Up for Telemedicine Visits  Angela Carpenter [id number] 10-Jul-1941 78 y.o. 07/21/2019 12:09 PM Tisovec, [name], MDTisovec, [name], MD   I connected with Ms. Murray Hodgkins on 07/21/19 at 11:45 AM EDT by telephone visit and verified that I am speaking with the correct person using two identifiers.   I discussed the limitations, risks, security and privacy concerns of performing an evaluation and management service by telemedicine and the availability of in-person appointments. I also discussed with the patient that there may be a patient responsible charge related to this service. The patient expressed understanding and agreed to proceed.  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  He was found to have a polypoid mass at the anus developed in December 2019.  Subsequent imaging studies showed potential advanced disease in the thigh and labia indicating stage IV disease.   Prior Therapy:  He is status post perianal excision of that tumor completed June 02, 2019.  Current therapy: Under evaluation for systemic therapy.  Interim History: Ms. Cianflone reports feeling reasonably well without any complications at this time.  She denies any recent hospitalization or illnesses.  He denies any recent trauma or falls.  He denies any abdominal pain or discomfort.  He does have history of genital herpes with recurrent exacerbation and activation at times.  She continues to be active and attends activities of daily living without any issues.    Medications: I have reviewed the patient's current medications.  Current Outpatient Medications  Medication Sig Dispense Refill  . alendronate (FOSAMAX) 70 MG tablet Take 70 mg by mouth once a week. Take with a full glass of water on an empty  stomach.    Marland Kitchen atorvastatin (LIPITOR) 10 MG tablet TAKE 1 TABLET DAILY 90 tablet 2  . Cholecalciferol (VITAMIN D-3) 125 MCG (5000 UT) TABS Take 5,000 Units by mouth daily.     Marland Kitchen docusate sodium (COLACE) 100 MG capsule Take 100 mg by mouth every evening.    Marland Kitchen ELIQUIS 5 MG TABS tablet TAKE 1 TABLET TWICE A DAY 180 tablet 1  . Multiple Vitamins-Minerals (PRESERVISION AREDS 2+MULTI VIT) CAPS Take 1 capsule by mouth 2 (two) times daily.    . prochlorperazine (COMPAZINE) 10 MG tablet Take 1 tablet (10 mg total) by mouth every 6 (six) hours as needed for nausea or vomiting. 30 tablet 0  . raloxifene (EVISTA) 60 MG tablet Take 60 mg by mouth daily.    . timolol (TIMOPTIC) 0.5 % ophthalmic solution Place 1 drop into both eyes 2 (two) times daily.    . TOPROL XL 25 MG 24 hr tablet TAKE 1 TABLET TWICE A DAY 180 tablet 2  . traMADol (ULTRAM) 50 MG tablet Take 1 tablet (50 mg total) by mouth every 6 (six) hours as needed. 15 tablet 0   Current Facility-Administered Medications  Medication Dose Route Frequency Provider Last Rate Last Dose  . 0.9 %  sodium chloride infusion  500 mL Intravenous Continuous Danis, Kirke Corin, MD         Allergies: No Known Allergies  Past Medical History, Surgical history, Social history, and Family History were reviewed and updated.       Lab Results: Lab Results  Component Value Date   WBC 9.0 05/26/2019   HGB 14.3 05/26/2019   HCT 44.4 05/26/2019  MCV 94.3 05/26/2019   PLT 290 05/26/2019     Chemistry      Component Value Date/Time   NA 137 05/26/2019 1031   NA 138 12/05/2016 0750   K 4.5 05/26/2019 1031   CL 103 05/26/2019 1031   CO2 26 05/26/2019 1031   BUN 15 05/26/2019 1031   BUN 15 12/05/2016 0750   CREATININE 0.65 05/26/2019 1031   CREATININE 0.70 09/05/2015 0828      Component Value Date/Time   CALCIUM 9.3 05/26/2019 1031   ALKPHOS 71 12/05/2016 0750   AST 25 12/05/2016 0750   ALT 21 12/05/2016 0750   BILITOT 0.3 12/05/2016 0750        Radiological Studies:  IMPRESSION: 1. Signs of metastatic disease to the left labia and left posterior thigh as well as soft tissue nodule in the presacral space just left of midline in the pelvis. 2. Activity in the low rectum of the level of the inguinal rectal junction suspicious for additional site of disease, direct visualization is suggested. 3. Nonspecific small lobulated nodule versus 2 adjacent nodules in the left lower lobe of of left hemidiaphragm. Follow-up chest CT may be helpful given motion artifact. No overt hypermetabolism though size and location may limit assessment.  Impression and Plan:  78 year old with:  1.  Mucosal melanoma diagnosed in August 2020 after presenting with an anal mass that was resected with perianal excision.  PET CT scan obtained on 07/16/2019 was personally reviewed and discussed with the patient via phone today in detail.  She has a few areas of suspicious lesions predominantly in the left posterior thigh as well as soft tissue nodule in the presacral space.  He also have a left labial uptake which could be explained by herpetic reactivation.  The differential diagnosis as well as treatment options were reviewed today.  Given the high risk disease associated with mucosal melanoma I have recommended proceeding with immunotherapy especially in the setting of abnormal lesions on her PET scan but could be indicating stage IV disease.  Complication associated with Pembrolizumab was discussed today as a single agent.  Given the fact that she has no bulky disease at this time single agent would be reasonable at this time.  Complications that include nausea, fatigue, immune mediated complications were reviewed.  These would include thyroid disease, pneumonitis, colitis.  After discussion today she is agreeable to proceed and will repeat imaging studies after 4 cycles.  2.  Immune mediated complications: We will continue to educate her about these  possibilities in the future.  3.  Dermatology surveillance: She will continue to follow-up with dermatology for surveillance.  4.  Antiemetics: Prescription for Compazine will be available to her.  3.   Will be in the near future to start therapy.   I discussed the assessment and treatment plan with the patient. The patient was provided an opportunity to ask questions and all were answered. The patient agreed with the plan and demonstrated an understanding of the instructions.   The patient was advised to call back or seek an in-person evaluation if the symptoms worsen or if the condition fails to improve as anticipated.  I provided 20 minutes of non face-to-face telephone visit time during this encounter, and > 50% was spent on reviewing her imaging studies, treatment options complication related to therapy.  Zola Button, MD 07/21/2019 12:09 PM

## 2019-07-22 ENCOUNTER — Telehealth: Payer: Self-pay | Admitting: Oncology

## 2019-07-22 NOTE — Telephone Encounter (Signed)
Scheduled appt per 10/14 sch message.  Spoke with patient and she is aware of the appt dates and time.  Per the chemo class its no availability before the pt first treatment.  Sent a secure chat to see when we can get the patient scheduled for a patient education class.

## 2019-07-27 ENCOUNTER — Other Ambulatory Visit: Payer: Self-pay

## 2019-07-27 ENCOUNTER — Inpatient Hospital Stay: Payer: Medicare Other

## 2019-07-27 ENCOUNTER — Encounter: Payer: Self-pay | Admitting: Oncology

## 2019-07-27 NOTE — Progress Notes (Signed)
Met with patient after chemo ed class to introduce myself as Arboriculturist and to offer available resources.  Discussed one-time $37 Engineer, drilling to assist with personal expenses while going through treatment. Patient exceeds income guidelines based on verbal.  Gave her my card for any additional financial questions or concerns.

## 2019-07-29 ENCOUNTER — Other Ambulatory Visit: Payer: Self-pay

## 2019-07-29 ENCOUNTER — Inpatient Hospital Stay: Payer: Medicare Other

## 2019-07-29 VITALS — BP 144/79 | HR 75 | Temp 98.0°F | Resp 18 | Ht 65.0 in | Wt 131.8 lb

## 2019-07-29 DIAGNOSIS — Z7901 Long term (current) use of anticoagulants: Secondary | ICD-10-CM | POA: Diagnosis not present

## 2019-07-29 DIAGNOSIS — E039 Hypothyroidism, unspecified: Secondary | ICD-10-CM

## 2019-07-29 DIAGNOSIS — C211 Malignant neoplasm of anal canal: Secondary | ICD-10-CM | POA: Diagnosis not present

## 2019-07-29 DIAGNOSIS — Z5112 Encounter for antineoplastic immunotherapy: Secondary | ICD-10-CM | POA: Diagnosis not present

## 2019-07-29 DIAGNOSIS — Z79899 Other long term (current) drug therapy: Secondary | ICD-10-CM | POA: Diagnosis not present

## 2019-07-29 LAB — CBC WITH DIFFERENTIAL (CANCER CENTER ONLY)
Abs Immature Granulocytes: 0.04 10*3/uL (ref 0.00–0.07)
Basophils Absolute: 0.1 10*3/uL (ref 0.0–0.1)
Basophils Relative: 2 %
Eosinophils Absolute: 0.4 10*3/uL (ref 0.0–0.5)
Eosinophils Relative: 5 %
HCT: 40.6 % (ref 36.0–46.0)
Hemoglobin: 13.1 g/dL (ref 12.0–15.0)
Immature Granulocytes: 1 %
Lymphocytes Relative: 22 %
Lymphs Abs: 1.6 10*3/uL (ref 0.7–4.0)
MCH: 30.6 pg (ref 26.0–34.0)
MCHC: 32.3 g/dL (ref 30.0–36.0)
MCV: 94.9 fL (ref 80.0–100.0)
Monocytes Absolute: 0.8 10*3/uL (ref 0.1–1.0)
Monocytes Relative: 10 %
Neutro Abs: 4.4 10*3/uL (ref 1.7–7.7)
Neutrophils Relative %: 60 %
Platelet Count: 297 10*3/uL (ref 150–400)
RBC: 4.28 MIL/uL (ref 3.87–5.11)
RDW: 13.9 % (ref 11.5–15.5)
WBC Count: 7.3 10*3/uL (ref 4.0–10.5)
nRBC: 0 % (ref 0.0–0.2)

## 2019-07-29 LAB — CMP (CANCER CENTER ONLY)
ALT: 20 U/L (ref 0–44)
AST: 19 U/L (ref 15–41)
Albumin: 4 g/dL (ref 3.5–5.0)
Alkaline Phosphatase: 69 U/L (ref 38–126)
Anion gap: 10 (ref 5–15)
BUN: 11 mg/dL (ref 8–23)
CO2: 25 mmol/L (ref 22–32)
Calcium: 9.2 mg/dL (ref 8.9–10.3)
Chloride: 105 mmol/L (ref 98–111)
Creatinine: 0.7 mg/dL (ref 0.44–1.00)
GFR, Est AFR Am: >60 mL/min (ref >60)
GFR, Estimated: >60 mL/min (ref >60)
Glucose, Bld: 90 mg/dL (ref 70–99)
Potassium: 4.3 mmol/L (ref 3.5–5.1)
Sodium: 140 mmol/L (ref 135–145)
Total Bilirubin: 0.4 mg/dL (ref 0.3–1.2)
Total Protein: 6.7 g/dL (ref 6.5–8.1)

## 2019-07-29 LAB — TSH: TSH: 3.438 u[IU]/mL (ref 0.308–3.960)

## 2019-07-29 MED ORDER — SODIUM CHLORIDE 0.9 % IV SOLN
200.0000 mg | Freq: Once | INTRAVENOUS | Status: AC
  Administered 2019-07-29: 200 mg via INTRAVENOUS
  Filled 2019-07-29: qty 8

## 2019-07-29 MED ORDER — SODIUM CHLORIDE 0.9 % IV SOLN
Freq: Once | INTRAVENOUS | Status: AC
  Administered 2019-07-29: 10:00:00 via INTRAVENOUS
  Filled 2019-07-29: qty 250

## 2019-07-29 NOTE — Patient Instructions (Signed)
[facility] Discharge Instructions for Patients Receiving Chemotherapy  Today you received the following chemotherapy agents: Keytruda.  To help prevent nausea and vomiting after your treatment, we encourage you to take your nausea medication as directed.   If you develop nausea and vomiting that is not controlled by your nausea medication, call the clinic.   BELOW ARE SYMPTOMS THAT SHOULD BE REPORTED IMMEDIATELY:  *FEVER GREATER THAN 100.5 F  *CHILLS WITH OR WITHOUT FEVER  NAUSEA AND VOMITING THAT IS NOT CONTROLLED WITH YOUR NAUSEA MEDICATION  *UNUSUAL SHORTNESS OF BREATH  *UNUSUAL BRUISING OR BLEEDING  TENDERNESS IN MOUTH AND THROAT WITH OR WITHOUT PRESENCE OF ULCERS  *URINARY PROBLEMS  *BOWEL PROBLEMS  UNUSUAL RASH Items with * indicate a potential emergency and should be followed up as soon as possible.  Feel free to call the clinic should you have any questions or concerns. The clinic phone number is [phone].  Please show the CHEMO ALERT CARD at check-in to the Emergency Department and triage nurse.  Pembrolizumab injection What is this medicine? PEMBROLIZUMAB (pem broe liz ue mab) is a monoclonal antibody. It is used to treat bladder cancer, cervical cancer, endometrial cancer, esophageal cancer, head and neck cancer, hepatocellular cancer, Hodgkin lymphoma, kidney cancer, lymphoma, melanoma, Merkel cell carcinoma, lung cancer, stomach cancer, urothelial cancer, and cancers that have a certain genetic condition. This medicine may be used for other purposes; ask your health care provider or pharmacist if you have questions. COMMON BRAND NAME(S): Keytruda What should I tell my health care provider before I take this medicine? They need to know if you have any of these conditions:  diabetes  immune system problems  inflammatory bowel disease  liver disease  lung or breathing disease  lupus  received or scheduled to receive an organ  transplant or a stem-cell transplant that uses donor stem cells  an unusual or allergic reaction to pembrolizumab, other medicines, foods, dyes, or preservatives  pregnant or trying to get pregnant  breast-feeding How should I use this medicine? This medicine is for infusion into a vein. It is given by a health care professional in a hospital or clinic setting. A special MedGuide will be given to you before each treatment. Be sure to read this information carefully each time. Talk to your pediatrician regarding the use of this medicine in children. While this drug may be prescribed for selected conditions, precautions do apply. Overdosage: If you think you have taken too much of this medicine contact a poison control center or emergency room at once. NOTE: This medicine is only for you. Do not share this medicine with others. What if I miss a dose? It is important not to miss your dose. Call your doctor or health care professional if you are unable to keep an appointment. What may interact with this medicine? Interactions have not been studied. Give your health care provider a list of all the medicines, herbs, non-prescription drugs, or dietary supplements you use. Also tell them if you smoke, drink alcohol, or use illegal drugs. Some items may interact with your medicine. This list may not describe all possible interactions. Give your health care provider a list of all the medicines, herbs, non-prescription drugs, or dietary supplements you use. Also tell them if you smoke, drink alcohol, or use illegal drugs. Some items may interact with your medicine. What should I watch for while using this medicine? Your condition will be monitored carefully while you are receiving this medicine. You may need   blood work done while you are taking this medicine. Do not become pregnant while taking this medicine or for 4 months after stopping it. Women should inform their doctor if they wish to become  pregnant or think they might be pregnant. There is a potential for serious side effects to an unborn child. Talk to your health care professional or pharmacist for more information. Do not breast-feed an infant while taking this medicine or for 4 months after the last dose. What side effects may I notice from receiving this medicine? Side effects that you should report to your doctor or health care professional as soon as possible:  allergic reactions like skin rash, itching or hives, swelling of the face, lips, or tongue  bloody or black, tarry  breathing problems  changes in vision  chest pain  chills  confusion  constipation  cough  diarrhea  dizziness or feeling faint or lightheaded  fast or irregular heartbeat  fever  flushing  hair loss  joint pain  low blood counts - this medicine may decrease the number of white blood cells, red blood cells and platelets. You may be at increased risk for infections and bleeding.  muscle pain  muscle weakness  persistent headache  redness, blistering, peeling or loosening of the skin, including inside the mouth  signs and symptoms of high blood sugar such as dizziness; dry mouth; dry skin; fruity breath; nausea; stomach pain; increased hunger or thirst; increased urination  signs and symptoms of kidney injury like trouble passing urine or change in the amount of urine  signs and symptoms of liver injury like dark urine, light-colored stools, loss of appetite, nausea, right upper belly pain, yellowing of the eyes or skin  sweating  swollen lymph nodes  weight loss Side effects that usually do not require medical attention (report to your doctor or health care professional if they continue or are bothersome):  decreased appetite  muscle pain  tiredness This list may not describe all possible side effects. Call your doctor for medical advice about side effects. You may report side effects to FDA at  1-800-FDA-1088. Where should I keep my medicine? This drug is given in a hospital or clinic and will not be stored at home. NOTE: This sheet is a summary. It may not cover all possible information. If you have questions about this medicine, talk to your doctor, pharmacist, or health care provider.  2020 Elsevier/Gold Standard (2018-10-20 13:46:58)  

## 2019-08-19 ENCOUNTER — Inpatient Hospital Stay: Payer: Medicare Other

## 2019-08-19 ENCOUNTER — Inpatient Hospital Stay (HOSPITAL_BASED_OUTPATIENT_CLINIC_OR_DEPARTMENT_OTHER): Payer: Medicare Other | Admitting: Oncology

## 2019-08-19 ENCOUNTER — Other Ambulatory Visit: Payer: Self-pay

## 2019-08-19 ENCOUNTER — Inpatient Hospital Stay: Payer: Medicare Other | Attending: Oncology

## 2019-08-19 VITALS — BP 131/58 | HR 73 | Temp 98.3°F | Resp 16 | Ht 65.0 in | Wt 134.7 lb

## 2019-08-19 DIAGNOSIS — Z79899 Other long term (current) drug therapy: Secondary | ICD-10-CM | POA: Insufficient documentation

## 2019-08-19 DIAGNOSIS — E785 Hyperlipidemia, unspecified: Secondary | ICD-10-CM | POA: Insufficient documentation

## 2019-08-19 DIAGNOSIS — C211 Malignant neoplasm of anal canal: Secondary | ICD-10-CM | POA: Diagnosis not present

## 2019-08-19 DIAGNOSIS — Z7901 Long term (current) use of anticoagulants: Secondary | ICD-10-CM | POA: Insufficient documentation

## 2019-08-19 DIAGNOSIS — Z5112 Encounter for antineoplastic immunotherapy: Secondary | ICD-10-CM | POA: Insufficient documentation

## 2019-08-19 LAB — CBC WITH DIFFERENTIAL (CANCER CENTER ONLY)
Abs Immature Granulocytes: 0.05 10*3/uL (ref 0.00–0.07)
Basophils Absolute: 0.1 10*3/uL (ref 0.0–0.1)
Basophils Relative: 1 %
Eosinophils Absolute: 0.5 10*3/uL (ref 0.0–0.5)
Eosinophils Relative: 6 %
HCT: 44.4 % (ref 36.0–46.0)
Hemoglobin: 13.9 g/dL (ref 12.0–15.0)
Immature Granulocytes: 1 %
Lymphocytes Relative: 22 %
Lymphs Abs: 1.8 10*3/uL (ref 0.7–4.0)
MCH: 30.5 pg (ref 26.0–34.0)
MCHC: 31.3 g/dL (ref 30.0–36.0)
MCV: 97.4 fL (ref 80.0–100.0)
Monocytes Absolute: 1.3 10*3/uL — ABNORMAL HIGH (ref 0.1–1.0)
Monocytes Relative: 15 %
Neutro Abs: 4.5 10*3/uL (ref 1.7–7.7)
Neutrophils Relative %: 55 %
Platelet Count: 273 10*3/uL (ref 150–400)
RBC: 4.56 MIL/uL (ref 3.87–5.11)
RDW: 13.8 % (ref 11.5–15.5)
WBC Count: 8.2 10*3/uL (ref 4.0–10.5)
nRBC: 0 % (ref 0.0–0.2)

## 2019-08-19 LAB — CMP (CANCER CENTER ONLY)
ALT: 26 U/L (ref 0–44)
AST: 26 U/L (ref 15–41)
Albumin: 4.1 g/dL (ref 3.5–5.0)
Alkaline Phosphatase: 80 U/L (ref 38–126)
Anion gap: 9 (ref 5–15)
BUN: 12 mg/dL (ref 8–23)
CO2: 27 mmol/L (ref 22–32)
Calcium: 8.9 mg/dL (ref 8.9–10.3)
Chloride: 102 mmol/L (ref 98–111)
Creatinine: 0.72 mg/dL (ref 0.44–1.00)
GFR, Est AFR Am: >60 mL/min (ref >60)
GFR, Estimated: >60 mL/min (ref >60)
Glucose, Bld: 77 mg/dL (ref 70–99)
Potassium: 4.4 mmol/L (ref 3.5–5.1)
Sodium: 138 mmol/L (ref 135–145)
Total Bilirubin: 0.2 mg/dL — ABNORMAL LOW (ref 0.3–1.2)
Total Protein: 6.8 g/dL (ref 6.5–8.1)

## 2019-08-19 MED ORDER — SODIUM CHLORIDE 0.9 % IV SOLN
Freq: Once | INTRAVENOUS | Status: AC
  Administered 2019-08-19: 16:00:00 via INTRAVENOUS
  Filled 2019-08-19: qty 250

## 2019-08-19 MED ORDER — SODIUM CHLORIDE 0.9 % IV SOLN
200.0000 mg | Freq: Once | INTRAVENOUS | Status: AC
  Administered 2019-08-19: 200 mg via INTRAVENOUS
  Filled 2019-08-19: qty 8

## 2019-08-19 NOTE — Patient Instructions (Signed)
[facility] Discharge Instructions for Patients Receiving Chemotherapy  Today you received the following chemotherapy agents Pembrolizumab (KEYTRUDA).  To help prevent nausea and vomiting after your treatment, we encourage you to take your nausea medication as prescribed.   If you develop nausea and vomiting that is not controlled by your nausea medication, call the clinic.   BELOW ARE SYMPTOMS THAT SHOULD BE REPORTED IMMEDIATELY:  *FEVER GREATER THAN 100.5 F  *CHILLS WITH OR WITHOUT FEVER  NAUSEA AND VOMITING THAT IS NOT CONTROLLED WITH YOUR NAUSEA MEDICATION  *UNUSUAL SHORTNESS OF BREATH  *UNUSUAL BRUISING OR BLEEDING  TENDERNESS IN MOUTH AND THROAT WITH OR WITHOUT PRESENCE OF ULCERS  *URINARY PROBLEMS  *BOWEL PROBLEMS  UNUSUAL RASH Items with * indicate a potential emergency and should be followed up as soon as possible.  Feel free to call the clinic should you have any questions or concerns. The clinic phone number is [phone].  Please show the CHEMO ALERT CARD at check-in to the Emergency Department and triage nurse.  Coronavirus (COVID-19) Are you at risk?  Are you at risk for the Coronavirus (COVID-19)?  To be considered HIGH RISK for Coronavirus (COVID-19), you have to meet the following criteria:  . Traveled to [place], [place], [place], [place] or [place]; or in the [place] to [place], [place], [place], or [place]; and have fever, cough, and shortness of breath within the last 2 weeks of travel OR . Been in close contact with a person diagnosed with COVID-19 within the last 2 weeks and have fever, cough, and shortness of breath . IF YOU DO NOT MEET THESE CRITERIA, YOU ARE CONSIDERED LOW RISK FOR COVID-19.  What to do if you are HIGH RISK for COVID-19?  . If you are having a medical emergency, call 911. . Seek medical care right away. Before you go to a doctor's office, urgent care or emergency department, call ahead and tell  them about your recent travel, contact with someone diagnosed with COVID-19, and your symptoms. You should receive instructions from your physician's office regarding next steps of care.  . When you arrive at healthcare provider, tell the healthcare staff immediately you have returned from visiting [place], [place], [place], [place] or [place]; or traveled in the [place] to [place], [place], [place], or [place]; in the last two weeks or you have been in close contact with a person diagnosed with COVID-19 in the last 2 weeks.   . Tell the health care staff about your symptoms: fever, cough and shortness of breath. . After you have been seen by a medical provider, you will be either: o Tested for (COVID-19) and discharged home on quarantine except to seek medical care if symptoms worsen, and asked to  - Stay home and avoid contact with others until you get your results (4-5 days)  - Avoid travel on public transportation if possible (such as bus, train, or airplane) or o Sent to the Emergency Department by EMS for evaluation, COVID-19 testing, and possible admission depending on your condition and test results.  What to do if you are LOW RISK for COVID-19?  Reduce your risk of any infection by using the same precautions used for avoiding the common cold or flu:  . Wash your hands often with soap and warm water for at least 20 seconds.  If soap and water are not readily available, use an alcohol-based hand sanitizer with at least 60% alcohol.  . If coughing or   sneezing, cover your mouth and nose by coughing or sneezing into the elbow areas of your shirt or coat, into a tissue or into your sleeve (not your hands). . Avoid shaking hands with others and consider head nods or verbal greetings only. . Avoid touching your eyes, nose, or mouth with unwashed hands.  . Avoid close contact with people who are sick. . Avoid places or events with large numbers of people in one location, like concerts or  sporting events. . Carefully consider travel plans you have or are making. . If you are planning any travel outside or inside the [place], visit the CDC's Travelers' Health webpage for the latest health notices. . If you have some symptoms but not all symptoms, continue to monitor at home and seek medical attention if your symptoms worsen. . If you are having a medical emergency, call 911.   ADDITIONAL HEALTHCARE OPTIONS FOR PATIENTS  [facility] / e-Visit: [email or web address]         [facility]: [phone]  [facility]: [phone]                   [facility]: [phone]    

## 2019-08-19 NOTE — Progress Notes (Signed)
Hematology and Oncology Follow Up Visit  Angela Carpenter [id number] 09-Sep-1941 78 y.o. 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  PET CT scan showed area of a suspicious metastasis in the left posterior thigh and the presacral space documented in October 2020.   Prior Therapy:   She is status post perianal excision in August 2020.  Current therapy: Pembrolizumab 200 mg every 3 weeks started on July 29, 2019.  He is here for day 1 of cycle 2.  Interim History: [name] returns today for a follow-up visit.  Since the last visit, he reports no major changes in her health.  She has tolerated the Pembrolizumab without any new complaints.  She denies any excessive fatigue or tiredness or skin rash.  She denied any nausea or abdominal distention.  She denied any pain or discomfort.   She denied headaches, blurry vision, syncope or seizures.  Denies any fevers, chills or sweats.  Denied chest pain, palpitation, orthopnea or leg edema.  Denied cough, wheezing or hemoptysis.  Denied nausea, vomiting or abdominal pain.  Denies any constipation or diarrhea.  Denies any frequency urgency or hesitancy.  Denies any arthralgias or myalgias.  Denies any skin rashes or lesions.  Denies any bleeding or clotting tendency.  Denies any easy bruising.  Denies any hair or nail changes.  Denies any anxiety or depression.  Remaining review of system is negative.   Medications: I have reviewed the patient's current medications.  Current Outpatient Medications  Medication Sig Dispense Refill  . alendronate (FOSAMAX) 70 MG tablet Take 70 mg by mouth once a week. Take with a full glass of water on an empty stomach.    Marland Kitchen atorvastatin (LIPITOR) 10 MG tablet TAKE 1 TABLET DAILY 90 tablet 2  . Cholecalciferol (VITAMIN D-3) 125 MCG (5000 UT) TABS Take 5,000 Units by mouth daily.     Marland Kitchen docusate  sodium (COLACE) 100 MG capsule Take 100 mg by mouth every evening.    Marland Kitchen ELIQUIS 5 MG TABS tablet TAKE 1 TABLET TWICE A DAY 180 tablet 1  . Multiple Vitamins-Minerals (PRESERVISION AREDS 2+MULTI VIT) CAPS Take 1 capsule by mouth 2 (two) times daily.    . prochlorperazine (COMPAZINE) 10 MG tablet Take 1 tablet (10 mg total) by mouth every 6 (six) hours as needed for nausea or vomiting. 30 tablet 0  . raloxifene (EVISTA) 60 MG tablet Take 60 mg by mouth daily.    . timolol (TIMOPTIC) 0.5 % ophthalmic solution Place 1 drop into both eyes 2 (two) times daily.    . TOPROL XL 25 MG 24 hr tablet TAKE 1 TABLET TWICE A DAY 180 tablet 2  . traMADol (ULTRAM) 50 MG tablet Take 1 tablet (50 mg total) by mouth every 6 (six) hours as needed. 15 tablet 0   Current Facility-Administered Medications  Medication Dose Route Frequency Provider Last Rate Last Dose  . 0.9 %  sodium chloride infusion  500 mL Intravenous Continuous Angela, Kirke Corin, MD         Allergies: No Known Allergies  Past Medical History, Surgical history, Social history, and Family History were reviewed and updated.   Physical Exam: Blood pressure (!) 131/58, pulse 73, temperature 98.3 F (36.8 C), temperature source Temporal, resp. rate 16, height 5\' 5"  (1.651 m), weight 134 lb 11.2 oz (61.1 kg), SpO2 100 %.   ECOG:  General appearance: Comfortable appearing without any discomfort Head: Normocephalic without any trauma Oropharynx: Mucous membranes are moist and pink without any thrush or ulcers. Eyes: Pupils are equal and round reactive to light. Lymph nodes: No cervical, supraclavicular, inguinal or axillary lymphadenopathy.   Heart:regular rate and rhythm.  S1 and S2 without leg edema. Lung: Clear without any rhonchi or wheezes.  No dullness to percussion. Abdomin: Soft, nontender, nondistended with good bowel sounds.  No hepatosplenomegaly. Musculoskeletal: No joint deformity or effusion.  Full range of motion  noted. Neurological: No deficits noted on motor, sensory and deep tendon reflex exam. Skin: No petechial rash or dryness.  Appeared moist.  Psychiatric: Mood and affect appeared appropriate.      Lab Results: Lab Results  Component Value Date   WBC 7.3 07/29/2019   HGB 13.1 07/29/2019   HCT 40.6 07/29/2019   MCV 94.9 07/29/2019   PLT 297 07/29/2019     Chemistry      Component Value Date/Time   NA 140 07/29/2019 0835   NA 138 12/05/2016 0750   K 4.3 07/29/2019 0835   CL 105 07/29/2019 0835   CO2 25 07/29/2019 0835   BUN 11 07/29/2019 0835   BUN 15 12/05/2016 0750   CREATININE 0.70 07/29/2019 0835   CREATININE 0.70 09/05/2015 0828      Component Value Date/Time   CALCIUM 9.2 07/29/2019 0835   ALKPHOS 69 07/29/2019 0835   AST 19 07/29/2019 0835   ALT 20 07/29/2019 0835   BILITOT 0.4 07/29/2019 0835       Radiological Studies: IMPRESSION: 1. Signs of metastatic disease to the left labia and left posterior thigh as well as soft tissue nodule in the presacral space just left of midline in the pelvis. 2. Activity in the low rectum of the level of the inguinal rectal junction suspicious for additional site of disease, direct visualization is suggested. 3. Nonspecific small lobulated nodule versus 2 adjacent nodules in the left lower lobe of of left hemidiaphragm. Follow-up chest CT may be helpful given motion artifact. No overt hypermetabolism though size and location may limit assessment.  Impression and Plan:  78 year old with:  1.    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  She has involvement of the left labia, posterior thigh and the presacral space.  He is currently receiving Pembrolizumab without any major complications.  The natural course of this disease as well as long-term treatment options associated with this drug was reviewed.  The plan is to complete 4 cycles of therapy before repeat imaging studies.   Alternative treatment options would include combined immunotherapy with ipilimumab and nivolumab as a salvage.  For the time being we will continue with the same dose and schedule she is agreeable to continue.     2.  Immune mediated complications: Potential complications such as pneumonitis, thyroiditis, colitis as well as dermatitis were reiterated.  3. Dermatology surveillance: I recommended continued surveillance and follow-up with dermatology.  4.  Antiemetics: No nausea or vomiting reported at this time.  Compazine is available to her.  5.   Follow-up: In 3 weeks for the next cycle of therapy.   25  minutes was spent with the patient face-to-face today.  More than 50% of time was spent on updating her disease status, treatment options and addressing complications related to therapy.      Zola Button, MD 11/12/20202:23 PM

## 2019-08-20 ENCOUNTER — Telehealth: Payer: Self-pay | Admitting: Oncology

## 2019-08-20 NOTE — Telephone Encounter (Signed)
Scheduled appt per 11/12 los.

## 2019-09-07 DIAGNOSIS — Z85820 Personal history of malignant melanoma of skin: Secondary | ICD-10-CM | POA: Diagnosis not present

## 2019-09-07 DIAGNOSIS — L821 Other seborrheic keratosis: Secondary | ICD-10-CM | POA: Diagnosis not present

## 2019-09-07 DIAGNOSIS — B0089 Other herpesviral infection: Secondary | ICD-10-CM | POA: Diagnosis not present

## 2019-09-07 DIAGNOSIS — D1801 Hemangioma of skin and subcutaneous tissue: Secondary | ICD-10-CM | POA: Diagnosis not present

## 2019-09-07 DIAGNOSIS — D225 Melanocytic nevi of trunk: Secondary | ICD-10-CM | POA: Diagnosis not present

## 2019-09-07 DIAGNOSIS — L814 Other melanin hyperpigmentation: Secondary | ICD-10-CM | POA: Diagnosis not present

## 2019-09-07 DIAGNOSIS — D485 Neoplasm of uncertain behavior of skin: Secondary | ICD-10-CM | POA: Diagnosis not present

## 2019-09-09 ENCOUNTER — Inpatient Hospital Stay: Payer: Medicare Other

## 2019-09-09 ENCOUNTER — Other Ambulatory Visit: Payer: Self-pay

## 2019-09-09 ENCOUNTER — Inpatient Hospital Stay: Payer: Medicare Other | Attending: Oncology | Admitting: Oncology

## 2019-09-09 ENCOUNTER — Telehealth: Payer: Self-pay

## 2019-09-09 VITALS — BP 148/79 | HR 76 | Temp 97.9°F | Resp 18 | Ht 65.0 in | Wt 139.0 lb

## 2019-09-09 DIAGNOSIS — Z5112 Encounter for antineoplastic immunotherapy: Secondary | ICD-10-CM | POA: Insufficient documentation

## 2019-09-09 DIAGNOSIS — Z79899 Other long term (current) drug therapy: Secondary | ICD-10-CM | POA: Insufficient documentation

## 2019-09-09 DIAGNOSIS — C211 Malignant neoplasm of anal canal: Secondary | ICD-10-CM

## 2019-09-09 DIAGNOSIS — E785 Hyperlipidemia, unspecified: Secondary | ICD-10-CM | POA: Diagnosis not present

## 2019-09-09 DIAGNOSIS — E039 Hypothyroidism, unspecified: Secondary | ICD-10-CM

## 2019-09-09 DIAGNOSIS — Z7901 Long term (current) use of anticoagulants: Secondary | ICD-10-CM | POA: Insufficient documentation

## 2019-09-09 LAB — CBC WITH DIFFERENTIAL (CANCER CENTER ONLY)
Abs Immature Granulocytes: 0.04 10*3/uL (ref 0.00–0.07)
Basophils Absolute: 0.1 10*3/uL (ref 0.0–0.1)
Basophils Relative: 1 %
Eosinophils Absolute: 0.4 10*3/uL (ref 0.0–0.5)
Eosinophils Relative: 5 %
HCT: 42.2 % (ref 36.0–46.0)
Hemoglobin: 13.6 g/dL (ref 12.0–15.0)
Immature Granulocytes: 1 %
Lymphocytes Relative: 22 %
Lymphs Abs: 1.8 10*3/uL (ref 0.7–4.0)
MCH: 30.4 pg (ref 26.0–34.0)
MCHC: 32.2 g/dL (ref 30.0–36.0)
MCV: 94.2 fL (ref 80.0–100.0)
Monocytes Absolute: 0.8 10*3/uL (ref 0.1–1.0)
Monocytes Relative: 9 %
Neutro Abs: 5 10*3/uL (ref 1.7–7.7)
Neutrophils Relative %: 62 %
Platelet Count: 286 10*3/uL (ref 150–400)
RBC: 4.48 MIL/uL (ref 3.87–5.11)
RDW: 13.8 % (ref 11.5–15.5)
WBC Count: 8 10*3/uL (ref 4.0–10.5)
nRBC: 0 % (ref 0.0–0.2)

## 2019-09-09 LAB — CMP (CANCER CENTER ONLY)
ALT: 41 U/L (ref 0–44)
AST: 30 U/L (ref 15–41)
Albumin: 4.1 g/dL (ref 3.5–5.0)
Alkaline Phosphatase: 90 U/L (ref 38–126)
Anion gap: 10 (ref 5–15)
BUN: 15 mg/dL (ref 8–23)
CO2: 28 mmol/L (ref 22–32)
Calcium: 9.2 mg/dL (ref 8.9–10.3)
Chloride: 104 mmol/L (ref 98–111)
Creatinine: 0.72 mg/dL (ref 0.44–1.00)
GFR, Est AFR Am: >60 mL/min (ref >60)
GFR, Estimated: >60 mL/min (ref >60)
Glucose, Bld: 96 mg/dL (ref 70–99)
Potassium: 4.5 mmol/L (ref 3.5–5.1)
Sodium: 142 mmol/L (ref 135–145)
Total Bilirubin: 0.4 mg/dL (ref 0.3–1.2)
Total Protein: 6.8 g/dL (ref 6.5–8.1)

## 2019-09-09 LAB — TSH: TSH: 2.389 u[IU]/mL (ref 0.308–3.960)

## 2019-09-09 MED ORDER — SODIUM CHLORIDE 0.9 % IV SOLN
Freq: Once | INTRAVENOUS | Status: AC
  Administered 2019-09-09: 13:00:00 via INTRAVENOUS
  Filled 2019-09-09: qty 250

## 2019-09-09 MED ORDER — SODIUM CHLORIDE 0.9 % IV SOLN
200.0000 mg | Freq: Once | INTRAVENOUS | Status: AC
  Administered 2019-09-09: 200 mg via INTRAVENOUS
  Filled 2019-09-09: qty 8

## 2019-09-09 NOTE — Patient Instructions (Signed)
Pembrolizumab injection What is this medicine? PEMBROLIZUMAB (pem broe liz ue mab) is a monoclonal antibody. It is used to treat bladder cancer, cervical cancer, endometrial cancer, esophageal cancer, head and neck cancer, hepatocellular cancer, Hodgkin lymphoma, kidney cancer, lymphoma, melanoma, Merkel cell carcinoma, lung cancer, stomach cancer, urothelial cancer, and cancers that have a certain genetic condition. This medicine may be used for other purposes; ask your health care provider or pharmacist if you have questions. COMMON BRAND NAME(S): Keytruda What should I tell my health care provider before I take this medicine? They need to know if you have any of these conditions:  diabetes  immune system problems  inflammatory bowel disease  liver disease  lung or breathing disease  lupus  received or scheduled to receive an organ transplant or a stem-cell transplant that uses donor stem cells  an unusual or allergic reaction to pembrolizumab, other medicines, foods, dyes, or preservatives  pregnant or trying to get pregnant  breast-feeding How should I use this medicine? This medicine is for infusion into a vein. It is given by a health care professional in a hospital or clinic setting. A special MedGuide will be given to you before each treatment. Be sure to read this information carefully each time. Talk to your pediatrician regarding the use of this medicine in children. While this drug may be prescribed for selected conditions, precautions do apply. Overdosage: If you think you have taken too much of this medicine contact a poison control center or emergency room at once. NOTE: This medicine is only for you. Do not share this medicine with others. What if I miss a dose? It is important not to miss your dose. Call your doctor or health care professional if you are unable to keep an appointment. What may interact with this medicine? Interactions have not been studied. Give  your health care provider a list of all the medicines, herbs, non-prescription drugs, or dietary supplements you use. Also tell them if you smoke, drink alcohol, or use illegal drugs. Some items may interact with your medicine. This list may not describe all possible interactions. Give your health care provider a list of all the medicines, herbs, non-prescription drugs, or dietary supplements you use. Also tell them if you smoke, drink alcohol, or use illegal drugs. Some items may interact with your medicine. What should I watch for while using this medicine? Your condition will be monitored carefully while you are receiving this medicine. You may need blood work done while you are taking this medicine. Do not become pregnant while taking this medicine or for 4 months after stopping it. Women should inform their doctor if they wish to become pregnant or think they might be pregnant. There is a potential for serious side effects to an unborn child. Talk to your health care professional or pharmacist for more information. Do not breast-feed an infant while taking this medicine or for 4 months after the last dose. What side effects may I notice from receiving this medicine? Side effects that you should report to your doctor or health care professional as soon as possible:  allergic reactions like skin rash, itching or hives, swelling of the face, lips, or tongue  bloody or black, tarry  breathing problems  changes in vision  chest pain  chills  confusion  constipation  cough  diarrhea  dizziness or feeling faint or lightheaded  fast or irregular heartbeat  fever  flushing  hair loss  joint pain  low blood counts - this   medicine may decrease the number of white blood cells, red blood cells and platelets. You may be at increased risk for infections and bleeding.  muscle pain  muscle weakness  persistent headache  redness, blistering, peeling or loosening of the skin,  including inside the mouth  signs and symptoms of high blood sugar such as dizziness; dry mouth; dry skin; fruity breath; nausea; stomach pain; increased hunger or thirst; increased urination  signs and symptoms of kidney injury like trouble passing urine or change in the amount of urine  signs and symptoms of liver injury like dark urine, light-colored stools, loss of appetite, nausea, right upper belly pain, yellowing of the eyes or skin  sweating  swollen lymph nodes  weight loss Side effects that usually do not require medical attention (report to your doctor or health care professional if they continue or are bothersome):  decreased appetite  muscle pain  tiredness This list may not describe all possible side effects. Call your doctor for medical advice about side effects. You may report side effects to FDA at 1-800-FDA-1088. Where should I keep my medicine? This drug is given in a hospital or clinic and will not be stored at home. NOTE: This sheet is a summary. It may not cover all possible information. If you have questions about this medicine, talk to your doctor, pharmacist, or health care provider.  2020 Elsevier/Gold Standard (2018-10-20 13:46:58)  Coronavirus (COVID-19) Are you at risk?  Are you at risk for the Coronavirus (COVID-19)?  To be considered HIGH RISK for Coronavirus (COVID-19), you have to meet the following criteria:  . Traveled to [place], [place], [place], [place] or [place]; or in the [place] to [place], [place], [place], or [place]; and have fever, cough, and shortness of breath within the last 2 weeks of travel OR . Been in close contact with a person diagnosed with COVID-19 within the last 2 weeks and have fever, cough, and shortness of breath . IF YOU DO NOT MEET THESE CRITERIA, YOU ARE CONSIDERED LOW RISK FOR COVID-19.  What to do if you are HIGH RISK for COVID-19?  . If you are having a medical emergency, call 911. . Seek medical  care right away. Before you go to a doctor's office, urgent care or emergency department, call ahead and tell them about your recent travel, contact with someone diagnosed with COVID-19, and your symptoms. You should receive instructions from your physician's office regarding next steps of care.  . When you arrive at healthcare provider, tell the healthcare staff immediately you have returned from visiting [place], [place], [place], [place] or [place]; or traveled in the [place] to [place], [place], [place], or [place]; in the last two weeks or you have been in close contact with a person diagnosed with COVID-19 in the last 2 weeks.   . Tell the health care staff about your symptoms: fever, cough and shortness of breath. . After you have been seen by a medical provider, you will be either: o Tested for (COVID-19) and discharged home on quarantine except to seek medical care if symptoms worsen, and asked to  - Stay home and avoid contact with others until you get your results (4-5 days)  - Avoid travel on public transportation if possible (such as bus, train, or airplane) or o Sent to the Emergency Department by EMS for evaluation, COVID-19 testing, and possible admission depending on your condition and test results.  What to do if you are   LOW RISK for COVID-19?  Reduce your risk of any infection by using the same precautions used for avoiding the common cold or flu:  . Wash your hands often with soap and warm water for at least 20 seconds.  If soap and water are not readily available, use an alcohol-based hand sanitizer with at least 60% alcohol.  . If coughing or sneezing, cover your mouth and nose by coughing or sneezing into the elbow areas of your shirt or coat, into a tissue or into your sleeve (not your hands). . Avoid shaking hands with others and consider head nods or verbal greetings only. . Avoid touching your eyes, nose, or mouth with unwashed hands.  . Avoid close contact with  people who are sick. . Avoid places or events with large numbers of people in one location, like concerts or sporting events. . Carefully consider travel plans you have or are making. . If you are planning any travel outside or inside the [place], visit the CDC's Travelers' Health webpage for the latest health notices. . If you have some symptoms but not all symptoms, continue to monitor at home and seek medical attention if your symptoms worsen. . If you are having a medical emergency, call 911.   ADDITIONAL HEALTHCARE OPTIONS FOR PATIENTS   Telehealth / e-Visit: [email or web address]         [facility]: [phone]  [facility]: [phone]                   [facility]: [phone]   

## 2019-09-09 NOTE — Progress Notes (Signed)
Hematology and Oncology Follow Up Visit  Angela Carpenter [id number] 10-20-1940 78 y.o. 09/09/2019 11:59 AM Angela Carpenter, MDTisovec, Angela Carpenter   Principle Diagnosis: 78 year old woman with melanoma of the anal canal with metastatic lesion at the left posterior thigh and the presacral space indicating stage IV mucosal diagnosed in October 2020.  Prior Therapy:   She is status post perianal excision in August 2020.  Current therapy: Pembrolizumab 200 mg every 3 weeks started on July 29, 2019.  She is here for cycle 3 of therapy.  Interim History: Angela Carpenter presents today for a repeat follow-up.  Since the last visit, she reports feeling reasonably well without any complaints.  She continues to tolerate Pembrolizumab without any recent issues or complaints.  She denies any fatigue, tiredness or skin rash.  She denies any abdominal pain or change in bowel habits.  She denies respiratory complaints.  She denies any new skin rashes or lesions.  She was started on acyclovir for previous herpes exposure and occasional flare.  He denied any alteration mental status, neuropathy, confusion or dizziness.  Denies any headaches or lethargy.  Denies any night sweats, weight loss or changes in appetite.  Denied orthopnea, dyspnea on exertion or chest discomfort.  Denies shortness of breath, difficulty breathing hemoptysis or cough.  Denies any abdominal distention, nausea, early satiety or dyspepsia.  Denies any hematuria, frequency, dysuria or nocturia.  Denies any skin irritation, dryness or rash.  Denies any ecchymosis or petechiae.  Denies any lymphadenopathy or clotting.  Denies any heat or cold intolerance.  Denies any anxiety or depression.  Remaining review of system is negative.        Medications: Updated without any pain.  Current Outpatient Medications  Medication Sig Dispense Refill  . alendronate (FOSAMAX) 70 MG tablet Take 70 mg by mouth once a week. Take with a full glass of  water on an empty stomach.    Marland Kitchen atorvastatin (LIPITOR) 10 MG tablet TAKE 1 TABLET DAILY 90 tablet 2  . Cholecalciferol (VITAMIN D-3) 125 MCG (5000 UT) TABS Take 5,000 Units by mouth daily.     Marland Kitchen docusate sodium (COLACE) 100 MG capsule Take 100 mg by mouth every evening.    Marland Kitchen ELIQUIS 5 MG TABS tablet TAKE 1 TABLET TWICE A DAY 180 tablet 1  . Multiple Vitamins-Minerals (PRESERVISION AREDS 2+MULTI VIT) CAPS Take 1 capsule by mouth 2 (two) times daily.    . prochlorperazine (COMPAZINE) 10 MG tablet Take 1 tablet (10 mg total) by mouth every 6 (six) hours as needed for nausea or vomiting. 30 tablet 0  . raloxifene (EVISTA) 60 MG tablet Take 60 mg by mouth daily.    . timolol (TIMOPTIC) 0.5 % ophthalmic solution Place 1 drop into both eyes 2 (two) times daily.    . TOPROL XL 25 MG 24 hr tablet TAKE 1 TABLET TWICE A DAY 180 tablet 2  . traMADol (ULTRAM) 50 MG tablet Take 1 tablet (50 mg total) by mouth every 6 (six) hours as needed. 15 tablet 0   Current Facility-Administered Medications  Medication Dose Route Frequency Provider Last Rate Last Dose  . 0.9 %  sodium chloride infusion  500 mL Intravenous Continuous Danis, Kirke Corin, Carpenter         Allergies: No Known Allergies  Past Medical History, Surgical history, Social history, and Family History without any changes on review.   Physical Exam: Blood pressure (!) 148/79, pulse 76, temperature 97.9 F (36.6 C), temperature source  Temporal, resp. rate 18, height 5\' 5"  (1.651 m), weight 139 lb (63 kg), SpO2 99 %.   ECOG: 1     General appearance: Alert, awake without any distress. Head: Atraumatic without abnormalities Oropharynx: Without any thrush or ulcers. Eyes: No scleral icterus. Lymph nodes: No lymphadenopathy noted in the cervical, supraclavicular, or axillary nodes Heart:regular rate and rhythm, without any murmurs or gallops.   Lung: Clear to auscultation without any rhonchi, wheezes or dullness to percussion. Abdomin: Soft,  nontender without any shifting dullness or ascites. Musculoskeletal: No clubbing or cyanosis. Neurological: No motor or sensory deficits. Skin: No rashes or lesions.        Lab Results: Lab Results  Component Value Date   WBC 8.0 09/09/2019   HGB 13.6 09/09/2019   HCT 42.2 09/09/2019   MCV 94.2 09/09/2019   PLT 286 09/09/2019     Chemistry      Component Value Date/Time   NA 138 08/19/2019 1413   NA 138 12/05/2016 0750   K 4.4 08/19/2019 1413   CL 102 08/19/2019 1413   CO2 27 08/19/2019 1413   BUN 12 08/19/2019 1413   BUN 15 12/05/2016 0750   CREATININE 0.72 08/19/2019 1413   CREATININE 0.70 09/05/2015 0828      Component Value Date/Time   CALCIUM 8.9 08/19/2019 1413   ALKPHOS 80 08/19/2019 1413   AST 26 08/19/2019 1413   ALT 26 08/19/2019 1413   BILITOT 0.2 (L) 08/19/2019 1413      Impression and Plan:  78 year old with:  1.    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  She was found to have a left thigh as well as a presacral space involvement indicating possible stage IV disease.  She continues to tolerate Pembrolizumab at this time without any major complaints.  Risks and benefits of continuing this therapy to complete at least 4 cycles was reviewed.  The plan is to repeat imaging studies after cycle 4 and consideration for longer treatment pending these results.  She is agreeable with this plan.    2.  Immune mediated complications: I continue to educate her about potential complications of include pneumonitis, colitis and thyroid disease.  She is agreeable to continue.  3. Dermatology surveillance: She continues to follow with dermatology for regular exams.  4.  Antiemetics: Compazine is available to her without any recent nausea or vomiting.  5.   Follow-up: She will return in 3 weeks for repeat follow-up.   25  minutes was spent with the patient face-to-face today.  More than 50% of time was dedicated to reviewing her  disease status, treatment options as well as answering questions regarding future plan of care.Zola Button, Carpenter 12/3/202011:59 AM

## 2019-09-09 NOTE — Addendum Note (Signed)
Addended by: [name] on: 09/09/2019 12:08 PM   Modules accepted: Orders

## 2019-09-15 ENCOUNTER — Other Ambulatory Visit: Payer: Self-pay | Admitting: Cardiology

## 2019-09-15 DIAGNOSIS — E7849 Other hyperlipidemia: Secondary | ICD-10-CM

## 2019-09-15 DIAGNOSIS — I119 Hypertensive heart disease without heart failure: Secondary | ICD-10-CM

## 2019-09-16 NOTE — Telephone Encounter (Addendum)
Prescription refill request for Eliquis received.  Last office visit:01/26/2019, Angela Carpenter, telemedicine Scr: 0.72, 09/09/2019 Age: 78 y.o. Weight: 63 kg   Prescription refill sent.

## 2019-09-30 ENCOUNTER — Inpatient Hospital Stay: Payer: Medicare Other

## 2019-09-30 ENCOUNTER — Inpatient Hospital Stay (HOSPITAL_BASED_OUTPATIENT_CLINIC_OR_DEPARTMENT_OTHER): Payer: Medicare Other | Admitting: Oncology

## 2019-09-30 ENCOUNTER — Other Ambulatory Visit: Payer: Self-pay

## 2019-09-30 VITALS — BP 137/64 | HR 80 | Temp 98.2°F | Resp 18 | Ht 65.0 in | Wt 142.5 lb

## 2019-09-30 DIAGNOSIS — C211 Malignant neoplasm of anal canal: Secondary | ICD-10-CM

## 2019-09-30 DIAGNOSIS — Z5112 Encounter for antineoplastic immunotherapy: Secondary | ICD-10-CM | POA: Diagnosis not present

## 2019-09-30 LAB — CBC WITH DIFFERENTIAL (CANCER CENTER ONLY)
Abs Immature Granulocytes: 0.06 10*3/uL (ref 0.00–0.07)
Basophils Absolute: 0.1 10*3/uL (ref 0.0–0.1)
Basophils Relative: 1 %
Eosinophils Absolute: 0.4 10*3/uL (ref 0.0–0.5)
Eosinophils Relative: 4 %
HCT: 42.9 % (ref 36.0–46.0)
Hemoglobin: 13.7 g/dL (ref 12.0–15.0)
Immature Granulocytes: 1 %
Lymphocytes Relative: 18 %
Lymphs Abs: 2 10*3/uL (ref 0.7–4.0)
MCH: 30.7 pg (ref 26.0–34.0)
MCHC: 31.9 g/dL (ref 30.0–36.0)
MCV: 96.2 fL (ref 80.0–100.0)
Monocytes Absolute: 0.9 10*3/uL (ref 0.1–1.0)
Monocytes Relative: 9 %
Neutro Abs: 7.3 10*3/uL (ref 1.7–7.7)
Neutrophils Relative %: 67 %
Platelet Count: 302 10*3/uL (ref 150–400)
RBC: 4.46 MIL/uL (ref 3.87–5.11)
RDW: 13.6 % (ref 11.5–15.5)
WBC Count: 10.8 10*3/uL — ABNORMAL HIGH (ref 4.0–10.5)
nRBC: 0 % (ref 0.0–0.2)

## 2019-09-30 LAB — CMP (CANCER CENTER ONLY)
ALT: 27 U/L (ref 0–44)
AST: 23 U/L (ref 15–41)
Albumin: 4.2 g/dL (ref 3.5–5.0)
Alkaline Phosphatase: 86 U/L (ref 38–126)
Anion gap: 9 (ref 5–15)
BUN: 15 mg/dL (ref 8–23)
CO2: 29 mmol/L (ref 22–32)
Calcium: 9 mg/dL (ref 8.9–10.3)
Chloride: 100 mmol/L (ref 98–111)
Creatinine: 0.72 mg/dL (ref 0.44–1.00)
GFR, Est AFR Am: >60 mL/min (ref >60)
GFR, Estimated: >60 mL/min (ref >60)
Glucose, Bld: 101 mg/dL — ABNORMAL HIGH (ref 70–99)
Potassium: 5.6 mmol/L — ABNORMAL HIGH (ref 3.5–5.1)
Sodium: 138 mmol/L (ref 135–145)
Total Bilirubin: 0.4 mg/dL (ref 0.3–1.2)
Total Protein: 7 g/dL (ref 6.5–8.1)

## 2019-09-30 MED ORDER — SODIUM CHLORIDE 0.9 % IV SOLN
200.0000 mg | Freq: Once | INTRAVENOUS | Status: AC
  Administered 2019-09-30: 200 mg via INTRAVENOUS
  Filled 2019-09-30: qty 8

## 2019-09-30 MED ORDER — SODIUM CHLORIDE 0.9 % IV SOLN
Freq: Once | INTRAVENOUS | Status: AC
  Filled 2019-09-30: qty 250

## 2019-09-30 NOTE — Progress Notes (Signed)
Hematology and Oncology Follow Up Visit  Angela Carpenter [id number] [birth year] 78 y.o. 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  She was found to have metastatic lesion in the left posterior thigh and the presacral space indicating stage IV disease.   Prior Therapy:   She is status post perianal excision in August 2020.  Current therapy: Pembrolizumab 200 mg every 3 weeks started on July 29, 2019.  She completed 3 cycles of therapy and here for evaluation prior to cycle 4.  Interim History: Angela Carpenter returns today for a repeat evaluation.  Since the last visit, she reports no major changes in her health.  She continues to tolerate Pembrolizumab without any recent complaints.  She denies any nausea, vomiting or abdominal pain.  She denies any weight loss or appetite changes.  She does report some hair loss associated with this treatment.  She denies any respiratory complaints clinic cough or wheezing.  No change in her bowel habits.   Patient denied headaches, blurry vision, syncope or seizures.  Denies any fevers, chills or sweats.  Denied chest pain, palpitation, orthopnea or leg edema.  Denied cough, wheezing or hemoptysis.  Denied nausea, vomiting or abdominal pain.  Denies any constipation or diarrhea.  Denies any frequency urgency or hesitancy.  Denies any arthralgias or myalgias.  Denies any skin rashes or lesions.  Denies any bleeding or clotting tendency.  Denies any easy bruising.  Denies any hair or nail changes.  Denies any anxiety or depression.  Remaining review of system is negative.          Medications: Unchanged on review. Current Outpatient Medications  Medication Sig Dispense Refill  . acyclovir (ZOVIRAX) 400 MG tablet Take 400 mg by mouth 2 (two) times daily.    Marland Kitchen alendronate (FOSAMAX) 70 MG tablet Take 70 mg by mouth once a week. Take  with a full glass of water on an empty stomach.    Marland Kitchen atorvastatin (LIPITOR) 10 MG tablet TAKE 1 TABLET DAILY 90 tablet 2  . Cholecalciferol (VITAMIN D-3) 125 MCG (5000 UT) TABS Take 5,000 Units by mouth daily.     Marland Kitchen ELIQUIS 5 MG TABS tablet TAKE 1 TABLET TWICE A DAY 180 tablet 1  . Multiple Vitamins-Minerals (PRESERVISION AREDS 2+MULTI VIT) CAPS Take 1 capsule by mouth 2 (two) times daily.    . prochlorperazine (COMPAZINE) 10 MG tablet Take 1 tablet (10 mg total) by mouth every 6 (six) hours as needed for nausea or vomiting. 30 tablet 0  . raloxifene (EVISTA) 60 MG tablet Take 60 mg by mouth daily.    . timolol (TIMOPTIC) 0.5 % ophthalmic solution Place 1 drop into both eyes 2 (two) times daily.    . TOPROL XL 25 MG 24 hr tablet TAKE 1 TABLET TWICE A DAY 180 tablet 2  . traMADol (ULTRAM) 50 MG tablet Take 1 tablet (50 mg total) by mouth every 6 (six) hours as needed. 15 tablet 0   Current Facility-Administered Medications  Medication Dose Route Frequency Provider Last Rate Last Admin  . 0.9 %  sodium chloride infusion  500 mL Intravenous Continuous Danis, Kirke Corin, MD         Allergies: No Known Allergies  Past Medical History, Surgical history, Social history, and Family History reviewed without any changes.   Physical Exam: Blood pressure 137/64, pulse 80, temperature 98.2 F (36.8 C), temperature  source Temporal, resp. rate 18, height 5\' 5"  (1.651 m), weight 142 lb 8 oz (64.6 kg), SpO2 99 %.    ECOG: 1   General appearance: Comfortable appearing without any discomfort Head: Normocephalic without any trauma Oropharynx: Mucous membranes are moist and pink without any thrush or ulcers. Eyes: Pupils are equal and round reactive to light. Lymph nodes: No cervical, supraclavicular, inguinal or axillary lymphadenopathy.   Heart:regular rate and rhythm.  S1 and S2 without leg edema. Lung: Clear without any rhonchi or wheezes.  No dullness to percussion. Abdomin: Soft, nontender,  nondistended with good bowel sounds.  No hepatosplenomegaly. Musculoskeletal: No joint deformity or effusion.  Full range of motion noted. Neurological: No deficits noted on motor, sensory and deep tendon reflex exam. Skin: No petechial rash or dryness.  Appeared moist.          Lab Results: Lab Results  Component Value Date   WBC 8.0 09/09/2019   HGB 13.6 09/09/2019   HCT 42.2 09/09/2019   MCV 94.2 09/09/2019   PLT 286 09/09/2019     Chemistry      Component Value Date/Time   NA 142 09/09/2019 1123   NA 138 12/05/2016 0750   K 4.5 09/09/2019 1123   CL 104 09/09/2019 1123   CO2 28 09/09/2019 1123   BUN 15 09/09/2019 1123   BUN 15 12/05/2016 0750   CREATININE 0.72 09/09/2019 1123   CREATININE 0.70 09/05/2015 0828      Component Value Date/Time   CALCIUM 9.2 09/09/2019 1123   ALKPHOS 90 09/09/2019 1123   AST 30 09/09/2019 1123   ALT 41 09/09/2019 1123   BILITOT 0.4 09/09/2019 1123      Impression and Plan:  78 year old with:  1.    Mucosal melanoma presented with anal lesion and subsequently found to have presacral mass indicating stage IV disease.    He remains on Pembrolizumab without any major complications at this time.  Complications related to this therapy was outlined including GI complications, fatigue as well as immune mediated complications.  We will repeat PET imaging before the next cycle of therapy to determine the duration of treatment.  He is agreeable to proceed at this time.   2.  Immune mediated complications: Thyroid disease, pneumonitis, colitis are possibilities related to this treatments.  She is not experiencing any of these complications and will continue to educate and monitor.  3. Dermatology surveillance: No skin rashes or lesions noted.  I recommended continued follow-up and surveillance by dermatology.  4.  Antiemetics: No nausea or vomiting reported at this time.  Antiemetics are available to her.  5. Follow-up: Will be in  3 weeks for repeat evaluation after imaging studies.   25  minutes was spent with the patient face-to-face today.  More than 50% of time was spent on reviewing laboratory data, disease status update, plan of care as well as dealing with complications related to current therapy.      Zola Button, MD 12/24/202012:58 PM

## 2019-10-04 ENCOUNTER — Telehealth: Payer: Self-pay | Admitting: Oncology

## 2019-10-04 NOTE — Telephone Encounter (Signed)
Scheduled appt per 12/24 los.

## 2019-10-11 ENCOUNTER — Encounter: Payer: Self-pay | Admitting: Oncology

## 2019-10-17 ENCOUNTER — Ambulatory Visit: Payer: Medicare Other | Attending: Internal Medicine

## 2019-10-17 DIAGNOSIS — Z23 Encounter for immunization: Secondary | ICD-10-CM | POA: Diagnosis not present

## 2019-10-17 NOTE — Progress Notes (Addendum)
   Covid-19 Vaccination Clinic  Name:  Angela Carpenter    MRN: [id number] DOB: [birth year]  10/17/2019  [name] was observed post Covid-19 immunization for 30 minutes without incidence. She was provided with Vaccine Information Sheet and instruction to access the V-Safe system.   [name] was instructed to call 911 with any severe reactions post vaccine: Marland Kitchen Difficulty breathing  . Swelling of your face and throat  . A fast heartbeat  . A bad rash all over your body  . Dizziness and weakness    Immunizations Administered    Name Date Dose VIS Date Route   Pfizer COVID-19 Vaccine 10/17/2019 11:57 AM 0.3 mL 09/17/2019 Intramuscular   Manufacturer: [employer]   Lot: [id number]   [place]: [id number]

## 2019-10-21 ENCOUNTER — Inpatient Hospital Stay: Payer: Medicare Other

## 2019-10-21 ENCOUNTER — Other Ambulatory Visit: Payer: Self-pay

## 2019-10-21 ENCOUNTER — Inpatient Hospital Stay: Payer: Medicare Other | Attending: Oncology | Admitting: Oncology

## 2019-10-21 ENCOUNTER — Telehealth: Payer: Self-pay | Admitting: Oncology

## 2019-10-21 VITALS — BP 145/72 | HR 69 | Temp 97.8°F | Resp 17 | Ht 65.0 in | Wt 142.6 lb

## 2019-10-21 DIAGNOSIS — E785 Hyperlipidemia, unspecified: Secondary | ICD-10-CM | POA: Insufficient documentation

## 2019-10-21 DIAGNOSIS — C211 Malignant neoplasm of anal canal: Secondary | ICD-10-CM

## 2019-10-21 DIAGNOSIS — Z7901 Long term (current) use of anticoagulants: Secondary | ICD-10-CM | POA: Insufficient documentation

## 2019-10-21 DIAGNOSIS — Z5112 Encounter for antineoplastic immunotherapy: Secondary | ICD-10-CM | POA: Diagnosis not present

## 2019-10-21 DIAGNOSIS — Z79899 Other long term (current) drug therapy: Secondary | ICD-10-CM | POA: Diagnosis not present

## 2019-10-21 LAB — CMP (CANCER CENTER ONLY)
ALT: 23 U/L (ref 0–44)
AST: 22 U/L (ref 15–41)
Albumin: 4 g/dL (ref 3.5–5.0)
Alkaline Phosphatase: 99 U/L (ref 38–126)
Anion gap: 10 (ref 5–15)
BUN: 16 mg/dL (ref 8–23)
CO2: 26 mmol/L (ref 22–32)
Calcium: 8.7 mg/dL — ABNORMAL LOW (ref 8.9–10.3)
Chloride: 104 mmol/L (ref 98–111)
Creatinine: 0.7 mg/dL (ref 0.44–1.00)
GFR, Est AFR Am: >60 mL/min (ref >60)
GFR, Estimated: >60 mL/min (ref >60)
Glucose, Bld: 94 mg/dL (ref 70–99)
Potassium: 4.9 mmol/L (ref 3.5–5.1)
Sodium: 140 mmol/L (ref 135–145)
Total Bilirubin: 0.4 mg/dL (ref 0.3–1.2)
Total Protein: 6.8 g/dL (ref 6.5–8.1)

## 2019-10-21 LAB — CBC WITH DIFFERENTIAL (CANCER CENTER ONLY)
Abs Immature Granulocytes: 0.05 10*3/uL (ref 0.00–0.07)
Basophils Absolute: 0.1 10*3/uL (ref 0.0–0.1)
Basophils Relative: 1 %
Eosinophils Absolute: 0.4 10*3/uL (ref 0.0–0.5)
Eosinophils Relative: 5 %
HCT: 42.5 % (ref 36.0–46.0)
Hemoglobin: 13.9 g/dL (ref 12.0–15.0)
Immature Granulocytes: 1 %
Lymphocytes Relative: 17 %
Lymphs Abs: 1.5 10*3/uL (ref 0.7–4.0)
MCH: 30.3 pg (ref 26.0–34.0)
MCHC: 32.7 g/dL (ref 30.0–36.0)
MCV: 92.8 fL (ref 80.0–100.0)
Monocytes Absolute: 0.9 10*3/uL (ref 0.1–1.0)
Monocytes Relative: 11 %
Neutro Abs: 5.6 10*3/uL (ref 1.7–7.7)
Neutrophils Relative %: 65 %
Platelet Count: 289 10*3/uL (ref 150–400)
RBC: 4.58 MIL/uL (ref 3.87–5.11)
RDW: 13.9 % (ref 11.5–15.5)
WBC Count: 8.6 10*3/uL (ref 4.0–10.5)
nRBC: 0 % (ref 0.0–0.2)

## 2019-10-21 MED ORDER — SODIUM CHLORIDE 0.9 % IV SOLN
200.0000 mg | Freq: Once | INTRAVENOUS | Status: AC
  Administered 2019-10-21: 200 mg via INTRAVENOUS
  Filled 2019-10-21: qty 8

## 2019-10-21 MED ORDER — SODIUM CHLORIDE 0.9 % IV SOLN
Freq: Once | INTRAVENOUS | Status: AC
  Filled 2019-10-21: qty 250

## 2019-10-21 NOTE — Telephone Encounter (Signed)
Scheduled appt per 1/14 los. 

## 2019-10-21 NOTE — Patient Instructions (Addendum)
Pembrolizumab injection What is this medicine? PEMBROLIZUMAB (pem broe liz ue mab) is a monoclonal antibody. It is used to treat bladder cancer, cervical cancer, endometrial cancer, esophageal cancer, head and neck cancer, hepatocellular cancer, Hodgkin lymphoma, kidney cancer, lymphoma, melanoma, Merkel cell carcinoma, lung cancer, stomach cancer, urothelial cancer, and cancers that have a certain genetic condition. This medicine may be used for other purposes; ask your health care provider or pharmacist if you have questions. COMMON BRAND NAME(S): Keytruda What should I tell my health care provider before I take this medicine? They need to know if you have any of these conditions:  diabetes  immune system problems  inflammatory bowel disease  liver disease  lung or breathing disease  lupus  received or scheduled to receive an organ transplant or a stem-cell transplant that uses donor stem cells  an unusual or allergic reaction to pembrolizumab, other medicines, foods, dyes, or preservatives  pregnant or trying to get pregnant  breast-feeding How should I use this medicine? This medicine is for infusion into a vein. It is given by a health care professional in a hospital or clinic setting. A special MedGuide will be given to you before each treatment. Be sure to read this information carefully each time. Talk to your pediatrician regarding the use of this medicine in children. While this drug may be prescribed for selected conditions, precautions do apply. Overdosage: If you think you have taken too much of this medicine contact a poison control center or emergency room at once. NOTE: This medicine is only for you. Do not share this medicine with others. What if I miss a dose? It is important not to miss your dose. Call your doctor or health care professional if you are unable to keep an appointment. What may interact with this medicine? Interactions have not been studied. Give  your health care provider a list of all the medicines, herbs, non-prescription drugs, or dietary supplements you use. Also tell them if you smoke, drink alcohol, or use illegal drugs. Some items may interact with your medicine. This list may not describe all possible interactions. Give your health care provider a list of all the medicines, herbs, non-prescription drugs, or dietary supplements you use. Also tell them if you smoke, drink alcohol, or use illegal drugs. Some items may interact with your medicine. What should I watch for while using this medicine? Your condition will be monitored carefully while you are receiving this medicine. You may need blood work done while you are taking this medicine. Do not become pregnant while taking this medicine or for 4 months after stopping it. Women should inform their doctor if they wish to become pregnant or think they might be pregnant. There is a potential for serious side effects to an unborn child. Talk to your health care professional or pharmacist for more information. Do not breast-feed an infant while taking this medicine or for 4 months after the last dose. What side effects may I notice from receiving this medicine? Side effects that you should report to your doctor or health care professional as soon as possible:  allergic reactions like skin rash, itching or hives, swelling of the face, lips, or tongue  bloody or black, tarry  breathing problems  changes in vision  chest pain  chills  confusion  constipation  cough  diarrhea  dizziness or feeling faint or lightheaded  fast or irregular heartbeat  fever  flushing  hair loss  joint pain  low blood counts - this   medicine may decrease the number of white blood cells, red blood cells and platelets. You may be at increased risk for infections and bleeding.  muscle pain  muscle weakness  persistent headache  redness, blistering, peeling or loosening of the skin,  including inside the mouth  signs and symptoms of high blood sugar such as dizziness; dry mouth; dry skin; fruity breath; nausea; stomach pain; increased hunger or thirst; increased urination  signs and symptoms of kidney injury like trouble passing urine or change in the amount of urine  signs and symptoms of liver injury like dark urine, light-colored stools, loss of appetite, nausea, right upper belly pain, yellowing of the eyes or skin  sweating  swollen lymph nodes  weight loss Side effects that usually do not require medical attention (report to your doctor or health care professional if they continue or are bothersome):  decreased appetite  muscle pain  tiredness This list may not describe all possible side effects. Call your doctor for medical advice about side effects. You may report side effects to FDA at 1-800-FDA-1088. Where should I keep my medicine? This drug is given in a hospital or clinic and will not be stored at home. NOTE: This sheet is a summary. It may not cover all possible information. If you have questions about this medicine, talk to your doctor, pharmacist, or health care provider.  2020 Elsevier/Gold Standard (2018-10-20 13:46:58)  

## 2019-10-21 NOTE — Progress Notes (Signed)
Hematology and Oncology Follow Up Visit  Angela Carpenter [id number] 03-Jun-1941 79 y.o. 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   Prior Therapy:   She is status post perianal excision in August 2020.  Current therapy: Pembrolizumab 200 mg every 3 weeks started on July 29, 2019.  She is here for cycle 5 of therapy.  Interim History: [name] presents today for repeat evaluation.  Since the last visit, she reports no major changes in her health.  She continues to tolerate Enbrel without any complaints.  She denies any nausea, fatigue or skin changes.  She does report some occasional hair loss although has improved as of late.  She denies any recent hospitalization or illnesses.  Her performance status and quality of life remain excellent.          Medications: Updated without changes. Current Outpatient Medications  Medication Sig Dispense Refill  . acyclovir (ZOVIRAX) 400 MG tablet Take 400 mg by mouth 2 (two) times daily.    Marland Kitchen alendronate (FOSAMAX) 70 MG tablet Take 70 mg by mouth once a week. Take with a full glass of water on an empty stomach.    Marland Kitchen atorvastatin (LIPITOR) 10 MG tablet TAKE 1 TABLET DAILY 90 tablet 2  . Cholecalciferol (VITAMIN D-3) 125 MCG (5000 UT) TABS Take 5,000 Units by mouth daily.     Marland Kitchen ELIQUIS 5 MG TABS tablet TAKE 1 TABLET TWICE A DAY 180 tablet 1  . Multiple Vitamins-Minerals (PRESERVISION AREDS 2+MULTI VIT) CAPS Take 1 capsule by mouth 2 (two) times daily.    . prochlorperazine (COMPAZINE) 10 MG tablet Take 1 tablet (10 mg total) by mouth every 6 (six) hours as needed for nausea or vomiting. 30 tablet 0  . raloxifene (EVISTA) 60 MG tablet Take 60 mg by mouth daily.    . timolol (TIMOPTIC) 0.5 % ophthalmic solution Place 1 drop into both eyes 2 (two) times  daily.    . TOPROL XL 25 MG 24 hr tablet TAKE 1 TABLET TWICE A DAY 180 tablet 2  . traMADol (ULTRAM) 50 MG tablet Take 1 tablet (50 mg total) by mouth every 6 (six) hours as needed. 15 tablet 0   Current Facility-Administered Medications  Medication Dose Route Frequency Provider Last Rate Last Admin  . 0.9 %  sodium chloride infusion  500 mL Intravenous Continuous Danis, Kirke Corin, MD         Allergies: No Known Allergies  Past Medical History, Surgical history, Social history, and Family History without any change in review.   Physical Exam:  Blood pressure (!) 145/72, pulse 69, temperature 97.8 F (36.6 C), temperature source Temporal, resp. rate 17, height 5\' 5"  (1.651 m), weight 142 lb 9.6 oz (64.7 kg), SpO2 100 %.    ECOG: 1     General appearance: Alert, awake without any distress. Head: Atraumatic without abnormalities Oropharynx: Without any thrush or ulcers. Eyes: No scleral icterus. Lymph nodes: No lymphadenopathy noted in the cervical, supraclavicular, or axillary nodes Heart:regular rate and rhythm, without any murmurs or gallops.   Lung: Clear to auscultation without any rhonchi, wheezes or dullness to percussion. Abdomin: Soft, nontender without any shifting dullness or ascites. Musculoskeletal: No clubbing or cyanosis. Neurological: No motor or sensory deficits. Skin: No rashes or lesions. Psychiatric: Mood and affect appeared normal.  Lab Results: Lab Results  Component Value Date   WBC 10.8 (H) 09/30/2019   HGB 13.7 09/30/2019   HCT 42.9 09/30/2019   MCV 96.2 09/30/2019   PLT 302 09/30/2019     Chemistry      Component Value Date/Time   NA 138 09/30/2019 1250   NA 138 12/05/2016 0750   K 5.6 (H) 09/30/2019 1250   CL 100 09/30/2019 1250   CO2 29 09/30/2019 1250   BUN 15 09/30/2019 1250   BUN 15 12/05/2016 0750   CREATININE 0.72 09/30/2019 1250   CREATININE 0.70 09/05/2015 0828      Component Value Date/Time   CALCIUM 9.0  09/30/2019 1250   ALKPHOS 86 09/30/2019 1250   AST 23 09/30/2019 1250   ALT 27 09/30/2019 1250   BILITOT 0.4 09/30/2019 1250      Impression and Plan:  79 year old with:  1.    Advanced mucosal melanoma of the anal canal.  She was found to have posterior thigh as well as sacral lesion.  She is currently receiving Pembrolizumab without any major complications.  Risks and benefits of continuing this therapy was discussed at this time.  Potential complications including GI toxicity as well as dermatological toxicity as well as immune mediated issues were reviewed.  The plan is to repeat imaging studies and decide the duration of therapy pending the results of her imaging studies.  Laboratory data were reviewed today and discussed with the patient and appear all within normal range.     2.  Immune mediated complications: I continue to reiterate these complications including pneumonitis, colitis and thyroid disease.  She is not experiencing any at this time.  3. Dermatology surveillance: I recommended continued dermatology surveillance at this time.  4.  Antiemetics: Compazine is available to her without any nausea or vomiting.  5. Follow-up: She will return in 3 weeks for repeat evaluation and next cycle of therapy.  30 minutes was spent on this encounter.  The time was dedicated to reviewing her laboratory data, previous imaging studies, disease status update as well as answering questions regarding future plan of care.      Zola Button, MD 1/14/202111:43 AM

## 2019-10-29 ENCOUNTER — Other Ambulatory Visit: Payer: Self-pay

## 2019-10-29 ENCOUNTER — Ambulatory Visit (HOSPITAL_COMMUNITY)
Admission: RE | Admit: 2019-10-29 | Discharge: 2019-10-29 | Disposition: A | Discharge status: Home / Self Care | Payer: Medicare Other | Source: Ambulatory Visit | Attending: Oncology | Admitting: Oncology

## 2019-10-29 DIAGNOSIS — C7982 Secondary malignant neoplasm of genital organs: Secondary | ICD-10-CM | POA: Diagnosis not present

## 2019-10-29 DIAGNOSIS — C211 Malignant neoplasm of anal canal: Secondary | ICD-10-CM | POA: Diagnosis not present

## 2019-10-29 DIAGNOSIS — I700 Atherosclerosis of aorta: Secondary | ICD-10-CM | POA: Insufficient documentation

## 2019-10-29 DIAGNOSIS — C787 Secondary malignant neoplasm of liver and intrahepatic bile duct: Secondary | ICD-10-CM | POA: Diagnosis not present

## 2019-10-29 DIAGNOSIS — K629 Disease of anus and rectum, unspecified: Secondary | ICD-10-CM | POA: Insufficient documentation

## 2019-10-29 DIAGNOSIS — Z79899 Other long term (current) drug therapy: Secondary | ICD-10-CM | POA: Insufficient documentation

## 2019-10-29 DIAGNOSIS — C210 Malignant neoplasm of anus, unspecified: Secondary | ICD-10-CM | POA: Diagnosis not present

## 2019-10-29 LAB — GLUCOSE, CAPILLARY: Glucose-Capillary: 98 mg/dL (ref 70–99)

## 2019-10-29 IMAGING — PT NM PET IMAGE RESTAGE (PS) WHOLE BODY
1 of 7 series · 4 of 25 positions shown · non-contrast
Comparison: [DATE] PET-CT.

CLINICAL DATA: Subsequent treatment strategy for stage IV anal
melanoma diagnosed [DATE] on chemotherapy. Additional history
of breast cancer.

EXAM:
NUCLEAR MEDICINE PET WHOLE BODY
TECHNIQUE: 7.1 mCi F-18 FDG was injected intravenously. Full-ring PET imaging
was performed from the skull base to thigh after the radiotracer. CT
data was obtained and used for attenuation correction and anatomic
localization.
Fasting blood glucose: 98 mg/dl

[Series 4: ct wb 5.0 b31f · axial · 5.0mm · 0.98mm/px · z∈[-16,+1076]mm · 4 of 455 slices shown]
[im 91/455  soft-tissue]
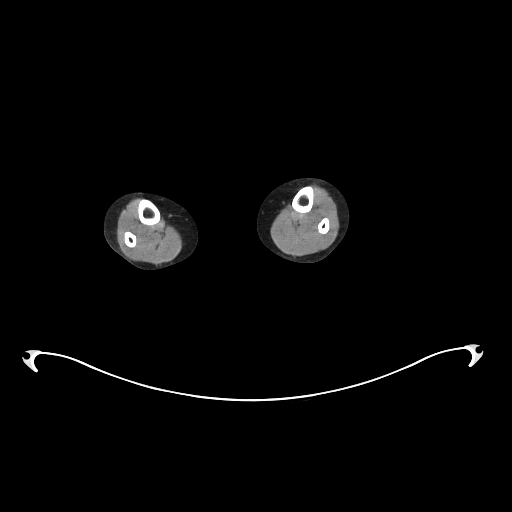
[im 182/455  soft-tissue]
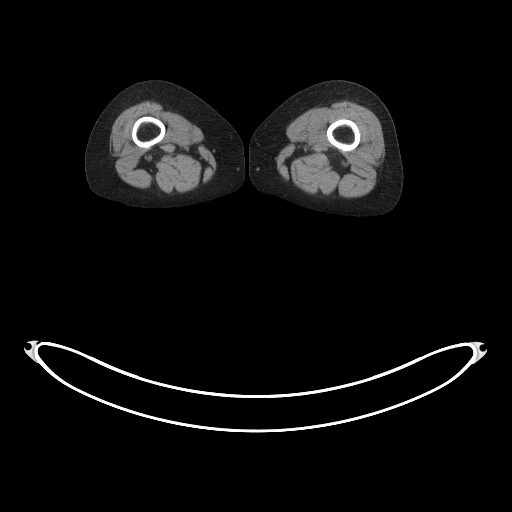
[im 273/455  soft-tissue]
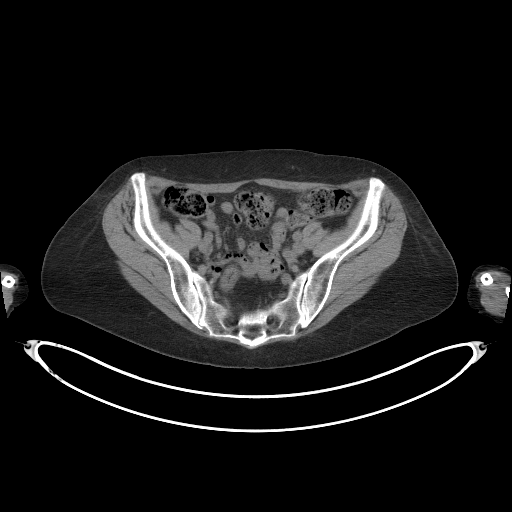
[im 364/455  soft-tissue]
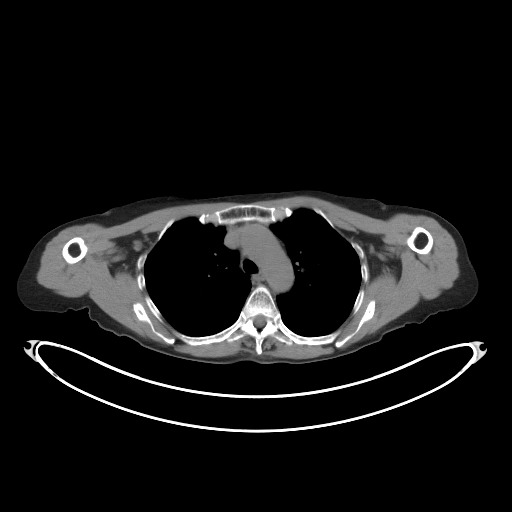

[4 of 25 positions shown; findings below may reference images not displayed]

FINDINGS: Mediastinal blood pool activity: SUV max

HEAD/NECK: No hypermetabolic activity in the scalp. No
hypermetabolic cervical lymph nodes.

Incidental CT findings: Mild mucoperiosteal thickening in the
dependent left maxillary sinus, new.

CHEST: No enlarged or hypermetabolic axillary, mediastinal or hilar
lymph nodes. No hypermetabolic pulmonary findings. New low level
hypermetabolism (max SUV 3.6) in the right breast adjacent to stable
a small soft tissue asymmetry adjacent to linear coarse
calcifications (series 4/image 139).

Incidental CT findings: Mildly atherosclerotic nonaneurysmal
thoracic aorta. Stable 0.6 cm subpleural solid basilar left lower
lobe pulmonary nodule (series 8/image 62), below PET resolution. No
acute consolidative airspace disease, lung masses or new significant
pulmonary nodules.

ABDOMEN/PELVIS: No abnormal hypermetabolic activity within the
liver, pancreas, adrenal glands, or spleen. No hypermetabolic lymph
nodes in the abdomen or pelvis.

Tiny 0.3 cm left presacral nodule with max SUV 2.1 (series 4/image
192), previously 0.7 cm with max SUV 3.6, decreased in size and
metabolism.

Left labial 1.1 cm soft tissue nodule with max SUV 5.2 (series
4/image 216), previously 1.6 cm with max SUV 14.2, decreased in size
and metabolism.

Hypermetabolic focus in posterior proximal left thigh musculature
with max SUV 9.7, previous max SUV 13.2, decreased in metabolism.

Hypermetabolic focus at the right anorectal junction with max SUV
3.5, previous max SUV 5.8, decreased in metabolism.

Incidental CT findings: Atherosclerotic nonaneurysmal abdominal
aorta. Moderate diffuse colonic stool.

SKELETON: No focal hypermetabolic activity to suggest skeletal
metastasis.

Incidental CT findings: none

EXTREMITIES: No abnormal hypermetabolic activity in the lower
extremities.

Incidental CT findings: none
IMPRESSION: 1. Partial metabolic response. Hypermetabolic metastases in the left
presacral space and left labia have decreased in size and
metabolism. Hypermetabolic lesion in the posterior proximal left
thigh musculature is decreased in metabolism. Hypermetabolic right
anorectal junction lesion is decreased in metabolism. No new or
progressive sites of hypermetabolic metastatic disease.
2. Nonspecific new low-level hypermetabolism in the right breast at
the site of presumed post lumpectomy change, which appears unchanged
on the CT images. Consider diagnostic mammographic evaluation if not
recently performed.
3.  Aortic Atherosclerosis ([5Q]-[5Q]).

## 2019-10-29 MED ORDER — FLUDEOXYGLUCOSE F - 18 (FDG) INJECTION
7.1000 | Freq: Once | INTRAVENOUS | Status: AC | PRN
  Administered 2019-10-29: 7.1 via INTRAVENOUS

## 2019-10-29 MED ORDER — FLUDEOXYGLUCOSE F - 18 (FDG) INJECTION: 1015.0000 | Freq: Once | INTRAVENOUS | Status: DC | PRN

## 2019-11-06 ENCOUNTER — Ambulatory Visit: Payer: Medicare Other

## 2019-11-07 ENCOUNTER — Ambulatory Visit: Payer: Medicare Other | Attending: Internal Medicine

## 2019-11-07 DIAGNOSIS — Z23 Encounter for immunization: Secondary | ICD-10-CM

## 2019-11-07 NOTE — Progress Notes (Signed)
   Covid-19 Vaccination Clinic  Name:  Angela Carpenter    MRN: [id number] DOB: 01/20/1941  11/07/2019  [name] was observed post Covid-19 immunization for 15 minutes without incidence. She was provided with Vaccine Information Sheet and instruction to access the V-Safe system.   [name] was instructed to call 911 with any severe reactions post vaccine: Marland Kitchen Difficulty breathing  . Swelling of your face and throat  . A fast heartbeat  . A bad rash all over your body  . Dizziness and weakness    Immunizations Administered    Name Date Dose VIS Date Route   Pfizer COVID-19 Vaccine 11/07/2019  8:04 AM 0.3 mL 09/17/2019 Intramuscular   Manufacturer: [place]   Lot: [id number]   [place]: [id number]

## 2019-11-09 DIAGNOSIS — H401131 Primary open-angle glaucoma, bilateral, mild stage: Secondary | ICD-10-CM | POA: Diagnosis not present

## 2019-11-11 ENCOUNTER — Inpatient Hospital Stay: Payer: Medicare Other | Attending: Oncology

## 2019-11-11 ENCOUNTER — Inpatient Hospital Stay (HOSPITAL_BASED_OUTPATIENT_CLINIC_OR_DEPARTMENT_OTHER): Payer: Medicare Other | Admitting: Oncology

## 2019-11-11 ENCOUNTER — Other Ambulatory Visit: Payer: Self-pay

## 2019-11-11 ENCOUNTER — Inpatient Hospital Stay: Payer: Medicare Other

## 2019-11-11 ENCOUNTER — Telehealth: Payer: Self-pay | Admitting: Oncology

## 2019-11-11 VITALS — BP 133/65 | HR 77 | Temp 98.0°F | Resp 18 | Ht 65.0 in | Wt 141.3 lb

## 2019-11-11 DIAGNOSIS — E785 Hyperlipidemia, unspecified: Secondary | ICD-10-CM | POA: Diagnosis not present

## 2019-11-11 DIAGNOSIS — C211 Malignant neoplasm of anal canal: Secondary | ICD-10-CM

## 2019-11-11 DIAGNOSIS — E039 Hypothyroidism, unspecified: Secondary | ICD-10-CM

## 2019-11-11 DIAGNOSIS — Z7901 Long term (current) use of anticoagulants: Secondary | ICD-10-CM | POA: Diagnosis not present

## 2019-11-11 DIAGNOSIS — Z5112 Encounter for antineoplastic immunotherapy: Secondary | ICD-10-CM | POA: Insufficient documentation

## 2019-11-11 DIAGNOSIS — Z79899 Other long term (current) drug therapy: Secondary | ICD-10-CM | POA: Insufficient documentation

## 2019-11-11 LAB — CMP (CANCER CENTER ONLY)
ALT: 27 U/L (ref 0–44)
AST: 23 U/L (ref 15–41)
Albumin: 4.2 g/dL (ref 3.5–5.0)
Alkaline Phosphatase: 101 U/L (ref 38–126)
Anion gap: 12 (ref 5–15)
BUN: 15 mg/dL (ref 8–23)
CO2: 25 mmol/L (ref 22–32)
Calcium: 9.2 mg/dL (ref 8.9–10.3)
Chloride: 103 mmol/L (ref 98–111)
Creatinine: 0.72 mg/dL (ref 0.44–1.00)
GFR, Est AFR Am: >60 mL/min (ref >60)
GFR, Estimated: >60 mL/min (ref >60)
Glucose, Bld: 109 mg/dL — ABNORMAL HIGH (ref 70–99)
Potassium: 4.5 mmol/L (ref 3.5–5.1)
Sodium: 140 mmol/L (ref 135–145)
Total Bilirubin: 0.4 mg/dL (ref 0.3–1.2)
Total Protein: 7.2 g/dL (ref 6.5–8.1)

## 2019-11-11 LAB — CBC WITH DIFFERENTIAL (CANCER CENTER ONLY)
Abs Immature Granulocytes: 0.06 10*3/uL (ref 0.00–0.07)
Basophils Absolute: 0.1 10*3/uL (ref 0.0–0.1)
Basophils Relative: 1 %
Eosinophils Absolute: 0.4 10*3/uL (ref 0.0–0.5)
Eosinophils Relative: 4 %
HCT: 46.3 % — ABNORMAL HIGH (ref 36.0–46.0)
Hemoglobin: 15.2 g/dL — ABNORMAL HIGH (ref 12.0–15.0)
Immature Granulocytes: 1 %
Lymphocytes Relative: 17 %
Lymphs Abs: 1.6 10*3/uL (ref 0.7–4.0)
MCH: 30.8 pg (ref 26.0–34.0)
MCHC: 32.8 g/dL (ref 30.0–36.0)
MCV: 93.7 fL (ref 80.0–100.0)
Monocytes Absolute: 0.8 10*3/uL (ref 0.1–1.0)
Monocytes Relative: 9 %
Neutro Abs: 6.7 10*3/uL (ref 1.7–7.7)
Neutrophils Relative %: 68 %
Platelet Count: 307 10*3/uL (ref 150–400)
RBC: 4.94 MIL/uL (ref 3.87–5.11)
RDW: 13.8 % (ref 11.5–15.5)
WBC Count: 9.7 10*3/uL (ref 4.0–10.5)
nRBC: 0 % (ref 0.0–0.2)

## 2019-11-11 LAB — TSH: TSH: 2.603 u[IU]/mL (ref 0.308–3.960)

## 2019-11-11 MED ORDER — SODIUM CHLORIDE 0.9 % IV SOLN
Freq: Once | INTRAVENOUS | Status: AC
  Filled 2019-11-11: qty 250

## 2019-11-11 MED ORDER — SODIUM CHLORIDE 0.9 % IV SOLN
200.0000 mg | Freq: Once | INTRAVENOUS | Status: AC
  Administered 2019-11-11: 15:00:00 200 mg via INTRAVENOUS
  Filled 2019-11-11: qty 8

## 2019-11-11 NOTE — Telephone Encounter (Signed)
Scheduled appt per 2/4 los,  Patient will get an updated appt calendar at her next appt.

## 2019-11-11 NOTE — Progress Notes (Signed)
Hematology and Oncology Follow Up Visit  HUNTLEY BUTORAC [id number] 04-11-1941 79 y.o. 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  She was found to have stage IV with left labial involvement as well as soft tissue in the presacral space.     Prior Therapy:   She is status post perianal excision in August 2020.  Current therapy: Pembrolizumab 200 mg every 3 weeks started on July 29, 2019.  She is status post 5 cycles of therapy and here for cycle 6.  Interim History: Ms. Baumgardner returns today for repeat evaluation.  Since her last visit, she reports no major changes in her health.  She denied any complications related to therapy.  She denies any excessive fatigue, tiredness or skin rash.  She denies any diarrhea or pulmonary complaints.  Her performance status and quality of life remain excellent.          Medications: Updated on review. Current Outpatient Medications  Medication Sig Dispense Refill  . acyclovir (ZOVIRAX) 400 MG tablet Take 400 mg by mouth 2 (two) times daily.    Marland Kitchen alendronate (FOSAMAX) 70 MG tablet Take 70 mg by mouth once a week. Take with a full glass of water on an empty stomach.    Marland Kitchen atorvastatin (LIPITOR) 10 MG tablet TAKE 1 TABLET DAILY 90 tablet 2  . Cholecalciferol (VITAMIN D-3) 125 MCG (5000 UT) TABS Take 5,000 Units by mouth daily.     Marland Kitchen ELIQUIS 5 MG TABS tablet TAKE 1 TABLET TWICE A DAY 180 tablet 1  . Multiple Vitamins-Minerals (PRESERVISION AREDS 2+MULTI VIT) CAPS Take 1 capsule by mouth 2 (two) times daily.    . prochlorperazine (COMPAZINE) 10 MG tablet Take 1 tablet (10 mg total) by mouth every 6 (six) hours as needed for nausea or vomiting. 30 tablet 0  . raloxifene (EVISTA) 60 MG tablet Take 60 mg by mouth daily.    . timolol (TIMOPTIC) 0.5 % ophthalmic solution Place 1 drop into both eyes 2 (two) times daily.    . TOPROL  XL 25 MG 24 hr tablet TAKE 1 TABLET TWICE A DAY 180 tablet 2  . traMADol (ULTRAM) 50 MG tablet Take 1 tablet (50 mg total) by mouth every 6 (six) hours as needed. 15 tablet 0   Current Facility-Administered Medications  Medication Dose Route Frequency Provider Last Rate Last Admin  . 0.9 %  sodium chloride infusion  500 mL Intravenous Continuous Danis, Estill Cotta III, MD         Allergies: No Known Allergies     Physical Exam:  Blood pressure 133/65, pulse 77, temperature 98 F (36.7 C), temperature source Temporal, resp. rate 18, height 5\' 5"  (1.651 m), weight 141 lb 4.8 oz (64.1 kg), SpO2 100 %.     ECOG: 1    General appearance: Comfortable appearing without any discomfort Head: Normocephalic without any trauma Oropharynx: Mucous membranes are moist and pink without any thrush or ulcers. Eyes: Pupils are equal and round reactive to light. Lymph nodes: No cervical, supraclavicular, inguinal or axillary lymphadenopathy.   Heart:regular rate and rhythm.  S1 and S2 without leg edema. Lung: Clear without any rhonchi or wheezes.  No dullness to percussion. Abdomin: Soft, nontender, nondistended with good bowel sounds.  No hepatosplenomegaly. Musculoskeletal: No joint deformity or effusion.  Full range of motion noted. Neurological: No deficits noted on motor, sensory and deep tendon  reflex exam. Skin: No petechial rash or dryness.  Appeared moist.  .           Lab Results: Lab Results  Component Value Date   WBC 8.6 10/21/2019   HGB 13.9 10/21/2019   HCT 42.5 10/21/2019   MCV 92.8 10/21/2019   PLT 289 10/21/2019     Chemistry      Component Value Date/Time   NA 140 10/21/2019 1130   NA 138 12/05/2016 0750   K 4.9 10/21/2019 1130   CL 104 10/21/2019 1130   CO2 26 10/21/2019 1130   BUN 16 10/21/2019 1130   BUN 15 12/05/2016 0750   CREATININE 0.70 10/21/2019 1130   CREATININE 0.70 09/05/2015 0828      Component Value Date/Time   CALCIUM 8.7 (L) 10/21/2019  1130   ALKPHOS 99 10/21/2019 1130   AST 22 10/21/2019 1130   ALT 23 10/21/2019 1130   BILITOT 0.4 10/21/2019 1130       IMPRESSION: 1. Partial metabolic response. Hypermetabolic metastases in the left presacral space and left labia have decreased in size and metabolism. Hypermetabolic lesion in the posterior proximal left thigh musculature is decreased in metabolism. Hypermetabolic right anorectal junction lesion is decreased in metabolism. No new or progressive sites of hypermetabolic metastatic disease. 2. Nonspecific new low-level hypermetabolism in the right breast at the site of presumed post lumpectomy change, which appears unchanged on the CT images. Consider diagnostic mammographic evaluation if not recently performed. 3.  Aortic Atherosclerosis (ICD10-I70.0).   Impression and Plan:  79 year old with:  1.   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  She was found to have stage IV disease with left labial involvement, left thigh and soft tissue involvement of the presacral space.      She is currently on Pembrolizumab which she has tolerated reasonably well without any complaints.  PET CT scan obtained on January 22 was personally reviewed today and discussed with the patient.  The scan showed a positive response to therapy with decrease in the size as well as the metabolic activity of the areas of metastasis.  This includes of the left presacral space, left labia as well as left thigh muscle involvement.  Risks and benefits of continuing this therapy for the time being were reviewed.  Given her response and overall excellent benefit I have recommended continuing this treatment for the time being.  I anticipate treatment with 4 more cycles and repeat imaging studies after that.  He is agreeable to continue at this time.   2.  Immune mediated complications: No issues reported at this time.  I continue to educate her about potential complications including  pneumonitis, colitis and thyroid disease.  3. Dermatology surveillance: No skin issues noted at this time.  She will continue dermatology follow-up.  4.  Antiemetics: No nausea or vomiting reported at this time.  Compazine is available to her.  5.  IV access: Risks and benefits of a Port-A-Cath insertion was reviewed at this time.  She continues to decline and prefers peripheral veins for the time being.  She understands if she has to she will agree to it.  6.  Nonspecific right breast findings: We will continue to monitor with the scheduled mammography in the next 2 to 3 months.  7. Follow-up: In 3 weeks for next cycle of therapy.  30 minutes was dedicated to this visit.  The time was spent on reviewing her disease status, reviewing imaging studies, complications lipid therapy  and future plan of care discussion      Zola Button, MD 2/4/202112:50 PM

## 2019-11-11 NOTE — Patient Instructions (Signed)
[facility] Discharge Instructions for Patients Receiving Chemotherapy  Today you received the following chemotherapy agents:  Keytruda.  To help prevent nausea and vomiting after your treatment, we encourage you to take your nausea medication as directed.   If you develop nausea and vomiting that is not controlled by your nausea medication, call the clinic.   BELOW ARE SYMPTOMS THAT SHOULD BE REPORTED IMMEDIATELY:  *FEVER GREATER THAN 100.5 F  *CHILLS WITH OR WITHOUT FEVER  NAUSEA AND VOMITING THAT IS NOT CONTROLLED WITH YOUR NAUSEA MEDICATION  *UNUSUAL SHORTNESS OF BREATH  *UNUSUAL BRUISING OR BLEEDING  TENDERNESS IN MOUTH AND THROAT WITH OR WITHOUT PRESENCE OF ULCERS  *URINARY PROBLEMS  *BOWEL PROBLEMS  UNUSUAL RASH Items with * indicate a potential emergency and should be followed up as soon as possible.  Feel free to call the clinic should you have any questions or concerns. The clinic phone number is [phone].  Please show the CHEMO ALERT CARD at check-in to the Emergency Department and triage nurse.    

## 2019-11-12 ENCOUNTER — Other Ambulatory Visit: Payer: Self-pay | Admitting: Cardiology

## 2019-11-12 DIAGNOSIS — I119 Hypertensive heart disease without heart failure: Secondary | ICD-10-CM

## 2019-11-12 DIAGNOSIS — Z9229 Personal history of other drug therapy: Secondary | ICD-10-CM

## 2019-11-12 DIAGNOSIS — I480 Paroxysmal atrial fibrillation: Secondary | ICD-10-CM

## 2019-11-12 DIAGNOSIS — E7849 Other hyperlipidemia: Secondary | ICD-10-CM

## 2019-11-13 ENCOUNTER — Ambulatory Visit: Payer: Medicare Other

## 2019-12-02 ENCOUNTER — Inpatient Hospital Stay: Payer: Medicare Other

## 2019-12-02 ENCOUNTER — Telehealth: Payer: Self-pay | Admitting: Oncology

## 2019-12-02 ENCOUNTER — Inpatient Hospital Stay (HOSPITAL_BASED_OUTPATIENT_CLINIC_OR_DEPARTMENT_OTHER): Payer: Medicare Other | Admitting: Oncology

## 2019-12-02 ENCOUNTER — Other Ambulatory Visit: Payer: Self-pay

## 2019-12-02 VITALS — BP 135/54 | HR 80 | Temp 97.8°F | Resp 18 | Ht 65.0 in | Wt 145.7 lb

## 2019-12-02 DIAGNOSIS — C211 Malignant neoplasm of anal canal: Secondary | ICD-10-CM

## 2019-12-02 DIAGNOSIS — E785 Hyperlipidemia, unspecified: Secondary | ICD-10-CM | POA: Diagnosis not present

## 2019-12-02 DIAGNOSIS — Z5112 Encounter for antineoplastic immunotherapy: Secondary | ICD-10-CM | POA: Diagnosis not present

## 2019-12-02 DIAGNOSIS — Z7901 Long term (current) use of anticoagulants: Secondary | ICD-10-CM | POA: Diagnosis not present

## 2019-12-02 DIAGNOSIS — Z79899 Other long term (current) drug therapy: Secondary | ICD-10-CM | POA: Diagnosis not present

## 2019-12-02 LAB — CBC WITH DIFFERENTIAL (CANCER CENTER ONLY)
Abs Immature Granulocytes: 0.06 10*3/uL (ref 0.00–0.07)
Basophils Absolute: 0.1 10*3/uL (ref 0.0–0.1)
Basophils Relative: 1 %
Eosinophils Absolute: 0.4 10*3/uL (ref 0.0–0.5)
Eosinophils Relative: 4 %
HCT: 43.3 % (ref 36.0–46.0)
Hemoglobin: 13.7 g/dL (ref 12.0–15.0)
Immature Granulocytes: 1 %
Lymphocytes Relative: 19 %
Lymphs Abs: 1.9 10*3/uL (ref 0.7–4.0)
MCH: 30.5 pg (ref 26.0–34.0)
MCHC: 31.6 g/dL (ref 30.0–36.0)
MCV: 96.4 fL (ref 80.0–100.0)
Monocytes Absolute: 0.9 10*3/uL (ref 0.1–1.0)
Monocytes Relative: 9 %
Neutro Abs: 6.5 10*3/uL (ref 1.7–7.7)
Neutrophils Relative %: 66 %
Platelet Count: 256 10*3/uL (ref 150–400)
RBC: 4.49 MIL/uL (ref 3.87–5.11)
RDW: 14 % (ref 11.5–15.5)
WBC Count: 9.8 10*3/uL (ref 4.0–10.5)
nRBC: 0 % (ref 0.0–0.2)

## 2019-12-02 LAB — CMP (CANCER CENTER ONLY)
ALT: 30 U/L (ref 0–44)
AST: 24 U/L (ref 15–41)
Albumin: 3.9 g/dL (ref 3.5–5.0)
Alkaline Phosphatase: 87 U/L (ref 38–126)
Anion gap: 6 (ref 5–15)
BUN: 16 mg/dL (ref 8–23)
CO2: 30 mmol/L (ref 22–32)
Calcium: 8.9 mg/dL (ref 8.9–10.3)
Chloride: 103 mmol/L (ref 98–111)
Creatinine: 0.69 mg/dL (ref 0.44–1.00)
GFR, Est AFR Am: >60 mL/min (ref >60)
GFR, Estimated: >60 mL/min (ref >60)
Glucose, Bld: 88 mg/dL (ref 70–99)
Potassium: 4.4 mmol/L (ref 3.5–5.1)
Sodium: 139 mmol/L (ref 135–145)
Total Bilirubin: 0.5 mg/dL (ref 0.3–1.2)
Total Protein: 6.8 g/dL (ref 6.5–8.1)

## 2019-12-02 MED ORDER — SODIUM CHLORIDE 0.9 % IV SOLN
200.0000 mg | Freq: Once | INTRAVENOUS | Status: AC
  Administered 2019-12-02: 200 mg via INTRAVENOUS
  Filled 2019-12-02: qty 8

## 2019-12-02 MED ORDER — SODIUM CHLORIDE 0.9 % IV SOLN
Freq: Once | INTRAVENOUS | Status: AC
  Filled 2019-12-02: qty 250

## 2019-12-02 NOTE — Progress Notes (Signed)
Hematology and Oncology Follow Up Visit  Angela Carpenter [id number] 19-Jul-1941 79 y.o. 12/02/2019 12:49 PM Carpenter, Angela Him, MDTisovec, Angela Him, MD   Principle Diagnosis: 79 year old woman with stage IV anal canal melanoma with left labial involvement as well as soft tissue pelvic disease diagnosed in August 2020.    Prior Therapy:   She is status post perianal excision in August 2020.  Current therapy: Pembrolizumab 200 mg every 3 weeks started on July 29, 2019.  She is here for cycle 7.  Interim History: Angela Carpenter returns today for a follow-up visit.  Since the last visit, she reports no major changes in her health.  She continues to tolerate pembrolizumab without any major complaints.  She does report some hair thinning but no pruritus or skin rash.  She denies any respiratory complaints or GI symptoms.  She continues to be active and walks 3 miles every day.          Medications: Without any changes on review. Current Outpatient Medications  Medication Sig Dispense Refill  . acyclovir (ZOVIRAX) 400 MG tablet Take 400 mg by mouth 2 (two) times daily.    Marland Kitchen alendronate (FOSAMAX) 70 MG tablet Take 70 mg by mouth once a week. Take with a full glass of water on an empty stomach.    Marland Kitchen atorvastatin (LIPITOR) 10 MG tablet TAKE 1 TABLET DAILY 90 tablet 0  . Cholecalciferol (VITAMIN D-3) 125 MCG (5000 UT) TABS Take 5,000 Units by mouth daily.     Marland Kitchen ELIQUIS 5 MG TABS tablet TAKE 1 TABLET TWICE A DAY 180 tablet 1  . metoprolol succinate (TOPROL-XL) 25 MG 24 hr tablet TAKE 1 TABLET TWICE A DAY 180 tablet 0  . Multiple Vitamins-Minerals (PRESERVISION AREDS 2+MULTI VIT) CAPS Take 1 capsule by mouth 2 (two) times daily.    . prochlorperazine (COMPAZINE) 10 MG tablet Take 1 tablet (10 mg total) by mouth every 6 (six) hours as needed for nausea or vomiting. 30 tablet 0  . raloxifene (EVISTA) 60 MG tablet Take 60 mg by mouth daily.    . timolol (TIMOPTIC) 0.5 % ophthalmic solution Place 1  drop into both eyes 2 (two) times daily.    . traMADol (ULTRAM) 50 MG tablet Take 1 tablet (50 mg total) by mouth every 6 (six) hours as needed. 15 tablet 0   Current Facility-Administered Medications  Medication Dose Route Frequency Provider Last Rate Last Admin  . 0.9 %  sodium chloride infusion  500 mL Intravenous Continuous Danis, Estill Cotta III, MD         Allergies: No Known Allergies     Physical Exam:  Blood pressure (!) 135/54, pulse 80, temperature 97.8 F (36.6 C), temperature source Temporal, resp. rate 18, height 5\' 5"  (1.651 m), weight 145 lb 11.2 oz (66.1 kg), SpO2 100 %.      ECOG: 1   General appearance: Alert, awake without any distress. Head: Atraumatic without abnormalities Oropharynx: Without any thrush or ulcers. Eyes: No scleral icterus. Lymph nodes: No lymphadenopathy noted in the cervical, supraclavicular, or axillary nodes Heart:regular rate and rhythm, without any murmurs or gallops.   Lung: Clear to auscultation without any rhonchi, wheezes or dullness to percussion. Abdomin: Soft, nontender without any shifting dullness or ascites. Musculoskeletal: No clubbing or cyanosis. Neurological: No motor or sensory deficits. Skin: No rashes or lesions.            Lab Results: Lab Results  Component Value Date   WBC 9.7 11/11/2019  HGB 15.2 (H) 11/11/2019   HCT 46.3 (H) 11/11/2019   MCV 93.7 11/11/2019   PLT 307 11/11/2019     Chemistry      Component Value Date/Time   NA 140 11/11/2019 1238   NA 138 12/05/2016 0750   K 4.5 11/11/2019 1238   CL 103 11/11/2019 1238   CO2 25 11/11/2019 1238   BUN 15 11/11/2019 1238   BUN 15 12/05/2016 0750   CREATININE 0.72 11/11/2019 1238   CREATININE 0.70 09/05/2015 0828      Component Value Date/Time   CALCIUM 9.2 11/11/2019 1238   ALKPHOS 101 11/11/2019 1238   AST 23 11/11/2019 1238   ALT 27 11/11/2019 1238   BILITOT 0.4 11/11/2019 1238         Impression and Plan:  79 year old  with:  1.   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   He remains on Pembrolizumab with excellent clinical and radiographic response and without any major complications.  Risks and benefits of continuing this therapy long-term was reviewed.  Potential duration could be till she achieves a complete response or poor tolerance.  She is agreeable to continue for the time being.  Alternative treatment options would be active surveillance and repeat imaging studies which is not recommended at this time.   2.  Immune mediated complications: Potential complications such as pneumonitis, colitis, dermatological toxicities among others were reviewed.  She is not experiencing any at this time.  3. Dermatology surveillance: She has follow-up with dermatology without any recent skin rashes.  4.  Antiemetics: Compazine is available to her without any nausea or vomiting.  5.  IV access: Peripheral veins continues to be in use at this time without any need for a Port-A-Cath.  5. Follow-up: She will return in 3 weeks for a follow-up and evaluation for the next cycle of therapy.  30 minutes were spent on this encounter.  Time was dedicated to reviewing her disease status, treatment options and addressing complications noted therapy.      Angela Button, MD 2/25/202112:49 PM

## 2019-12-02 NOTE — Telephone Encounter (Signed)
Scheduled appt per 2/25.

## 2019-12-02 NOTE — Patient Instructions (Signed)
[facility] Discharge Instructions for Patients Receiving Chemotherapy  Today you received the following chemotherapy agents:  Keytruda.  To help prevent nausea and vomiting after your treatment, we encourage you to take your nausea medication as directed.   If you develop nausea and vomiting that is not controlled by your nausea medication, call the clinic.   BELOW ARE SYMPTOMS THAT SHOULD BE REPORTED IMMEDIATELY:  *FEVER GREATER THAN 100.5 F  *CHILLS WITH OR WITHOUT FEVER  NAUSEA AND VOMITING THAT IS NOT CONTROLLED WITH YOUR NAUSEA MEDICATION  *UNUSUAL SHORTNESS OF BREATH  *UNUSUAL BRUISING OR BLEEDING  TENDERNESS IN MOUTH AND THROAT WITH OR WITHOUT PRESENCE OF ULCERS  *URINARY PROBLEMS  *BOWEL PROBLEMS  UNUSUAL RASH Items with * indicate a potential emergency and should be followed up as soon as possible.  Feel free to call the clinic should you have any questions or concerns. The clinic phone number is [phone].  Please show the CHEMO ALERT CARD at check-in to the Emergency Department and triage nurse.    

## 2019-12-10 DIAGNOSIS — D1801 Hemangioma of skin and subcutaneous tissue: Secondary | ICD-10-CM | POA: Diagnosis not present

## 2019-12-10 DIAGNOSIS — C4441 Basal cell carcinoma of skin of scalp and neck: Secondary | ICD-10-CM | POA: Diagnosis not present

## 2019-12-10 DIAGNOSIS — L814 Other melanin hyperpigmentation: Secondary | ICD-10-CM | POA: Diagnosis not present

## 2019-12-10 DIAGNOSIS — L570 Actinic keratosis: Secondary | ICD-10-CM | POA: Diagnosis not present

## 2019-12-10 DIAGNOSIS — Z85820 Personal history of malignant melanoma of skin: Secondary | ICD-10-CM | POA: Diagnosis not present

## 2019-12-10 DIAGNOSIS — L821 Other seborrheic keratosis: Secondary | ICD-10-CM | POA: Diagnosis not present

## 2019-12-10 DIAGNOSIS — D485 Neoplasm of uncertain behavior of skin: Secondary | ICD-10-CM | POA: Diagnosis not present

## 2019-12-10 DIAGNOSIS — L853 Xerosis cutis: Secondary | ICD-10-CM | POA: Diagnosis not present

## 2019-12-23 ENCOUNTER — Inpatient Hospital Stay: Payer: Medicare Other

## 2019-12-23 ENCOUNTER — Other Ambulatory Visit: Payer: Self-pay

## 2019-12-23 ENCOUNTER — Inpatient Hospital Stay: Payer: Medicare Other | Attending: Oncology

## 2019-12-23 ENCOUNTER — Inpatient Hospital Stay (HOSPITAL_BASED_OUTPATIENT_CLINIC_OR_DEPARTMENT_OTHER): Payer: Medicare Other | Admitting: Oncology

## 2019-12-23 VITALS — BP 137/67 | HR 65 | Temp 97.9°F | Resp 18 | Ht 65.0 in | Wt 144.0 lb

## 2019-12-23 DIAGNOSIS — E785 Hyperlipidemia, unspecified: Secondary | ICD-10-CM | POA: Diagnosis not present

## 2019-12-23 DIAGNOSIS — Z79899 Other long term (current) drug therapy: Secondary | ICD-10-CM | POA: Insufficient documentation

## 2019-12-23 DIAGNOSIS — C211 Malignant neoplasm of anal canal: Secondary | ICD-10-CM

## 2019-12-23 DIAGNOSIS — Z7901 Long term (current) use of anticoagulants: Secondary | ICD-10-CM | POA: Insufficient documentation

## 2019-12-23 DIAGNOSIS — Z5112 Encounter for antineoplastic immunotherapy: Secondary | ICD-10-CM | POA: Insufficient documentation

## 2019-12-23 DIAGNOSIS — E039 Hypothyroidism, unspecified: Secondary | ICD-10-CM

## 2019-12-23 LAB — CBC WITH DIFFERENTIAL (CANCER CENTER ONLY)
Abs Immature Granulocytes: 0.05 10*3/uL (ref 0.00–0.07)
Basophils Absolute: 0.1 10*3/uL (ref 0.0–0.1)
Basophils Relative: 1 %
Eosinophils Absolute: 0.5 10*3/uL (ref 0.0–0.5)
Eosinophils Relative: 5 %
HCT: 44.5 % (ref 36.0–46.0)
Hemoglobin: 14.4 g/dL (ref 12.0–15.0)
Immature Granulocytes: 1 %
Lymphocytes Relative: 19 %
Lymphs Abs: 1.8 10*3/uL (ref 0.7–4.0)
MCH: 30.3 pg (ref 26.0–34.0)
MCHC: 32.4 g/dL (ref 30.0–36.0)
MCV: 93.7 fL (ref 80.0–100.0)
Monocytes Absolute: 0.8 10*3/uL (ref 0.1–1.0)
Monocytes Relative: 8 %
Neutro Abs: 6.7 10*3/uL (ref 1.7–7.7)
Neutrophils Relative %: 66 %
Platelet Count: 284 10*3/uL (ref 150–400)
RBC: 4.75 MIL/uL (ref 3.87–5.11)
RDW: 13.6 % (ref 11.5–15.5)
WBC Count: 9.9 10*3/uL (ref 4.0–10.5)
nRBC: 0 % (ref 0.0–0.2)

## 2019-12-23 LAB — CMP (CANCER CENTER ONLY)
ALT: 19 U/L (ref 0–44)
AST: 22 U/L (ref 15–41)
Albumin: 4.3 g/dL (ref 3.5–5.0)
Alkaline Phosphatase: 88 U/L (ref 38–126)
Anion gap: 11 (ref 5–15)
BUN: 14 mg/dL (ref 8–23)
CO2: 27 mmol/L (ref 22–32)
Calcium: 9.6 mg/dL (ref 8.9–10.3)
Chloride: 101 mmol/L (ref 98–111)
Creatinine: 0.7 mg/dL (ref 0.44–1.00)
GFR, Est AFR Am: >60 mL/min (ref >60)
GFR, Estimated: >60 mL/min (ref >60)
Glucose, Bld: 89 mg/dL (ref 70–99)
Potassium: 4.5 mmol/L (ref 3.5–5.1)
Sodium: 139 mmol/L (ref 135–145)
Total Bilirubin: 0.5 mg/dL (ref 0.3–1.2)
Total Protein: 7.2 g/dL (ref 6.5–8.1)

## 2019-12-23 MED ORDER — SODIUM CHLORIDE 0.9 % IV SOLN
Freq: Once | INTRAVENOUS | Status: AC
  Filled 2019-12-23: qty 250

## 2019-12-23 MED ORDER — SODIUM CHLORIDE 0.9 % IV SOLN
200.0000 mg | Freq: Once | INTRAVENOUS | Status: AC
  Administered 2019-12-23: 200 mg via INTRAVENOUS
  Filled 2019-12-23: qty 8

## 2019-12-23 NOTE — Patient Instructions (Signed)
[facility] Discharge Instructions for Patients Receiving Chemotherapy  Today you received the following chemotherapy agents: Keytruda   To help prevent nausea and vomiting after your treatment, we encourage you to take your nausea medication as directed    If you develop nausea and vomiting that is not controlled by your nausea medication, call the clinic.   BELOW ARE SYMPTOMS THAT SHOULD BE REPORTED IMMEDIATELY:  *FEVER GREATER THAN 100.5 F  *CHILLS WITH OR WITHOUT FEVER  NAUSEA AND VOMITING THAT IS NOT CONTROLLED WITH YOUR NAUSEA MEDICATION  *UNUSUAL SHORTNESS OF BREATH  *UNUSUAL BRUISING OR BLEEDING  TENDERNESS IN MOUTH AND THROAT WITH OR WITHOUT PRESENCE OF ULCERS  *URINARY PROBLEMS  *BOWEL PROBLEMS  UNUSUAL RASH Items with * indicate a potential emergency and should be followed up as soon as possible.  Feel free to call the clinic you have any questions or concerns. The clinic phone number is [phone].  Please show the CHEMO ALERT CARD at check-in to the Emergency Department and triage nurse.   

## 2019-12-23 NOTE — Progress Notes (Signed)
Hematology and Oncology Follow Up Visit  Angela Carpenter [id number] 12/15/1940 79 y.o. 12/23/2019 3:02 PM Carpenter, Angela Him, MDTisovec, Angela Him, MD   Principle Diagnosis: 79 year old woman with mucosal melanoma of the anus diagnosed in August 2020.  She presented with stage IV including left labial involvement as well as soft tissue pelvic disease.   Prior Therapy:   She is status post perianal excision in August 2020.  Current therapy: Pembrolizumab 200 mg every 3 weeks started on July 29, 2019.  She is here for cycle 8.  Interim History: Angela Carpenter is here for return evaluation.  Since the last visit, she reports no major changes in her health.  She is grieving the loss of her husband who recently died while at a skilled nursing facility.  She is coping better as of late but overall no physical complaints.  She denies any nausea vomiting or abdominal pain.  She denies any recent hospitalization or illnesses.  She denies any respiratory complaints including cough or wheezing.  No change in bowel habits.  She does report some mild hair loss associated with immunotherapy.          Medications: Unchanged on review Current Outpatient Medications  Medication Sig Dispense Refill  . acyclovir (ZOVIRAX) 400 MG tablet Take 400 mg by mouth 2 (two) times daily.    Marland Kitchen alendronate (FOSAMAX) 70 MG tablet Take 70 mg by mouth once a week. Take with a full glass of water on an empty stomach.    Marland Kitchen atorvastatin (LIPITOR) 10 MG tablet TAKE 1 TABLET DAILY 90 tablet 0  . Cholecalciferol (VITAMIN D-3) 125 MCG (5000 UT) TABS Take 5,000 Units by mouth daily.     Marland Kitchen ELIQUIS 5 MG TABS tablet TAKE 1 TABLET TWICE A DAY 180 tablet 1  . metoprolol succinate (TOPROL-XL) 25 MG 24 hr tablet TAKE 1 TABLET TWICE A DAY 180 tablet 0  . Multiple Vitamins-Minerals (PRESERVISION AREDS 2+MULTI VIT) CAPS Take 1 capsule by mouth 2 (two) times daily.    . prochlorperazine (COMPAZINE) 10 MG tablet Take 1 tablet (10 mg total)  by mouth every 6 (six) hours as needed for nausea or vomiting. 30 tablet 0  . raloxifene (EVISTA) 60 MG tablet Take 60 mg by mouth daily.    . timolol (TIMOPTIC) 0.5 % ophthalmic solution Place 1 drop into both eyes 2 (two) times daily.    . traMADol (ULTRAM) 50 MG tablet Take 1 tablet (50 mg total) by mouth every 6 (six) hours as needed. 15 tablet 0   Current Facility-Administered Medications  Medication Dose Route Frequency Provider Last Rate Last Admin  . 0.9 %  sodium chloride infusion  500 mL Intravenous Continuous Danis, Estill Cotta III, MD         Allergies: No Known Allergies     Physical Exam:   Blood pressure 137/67, pulse 65, temperature 97.9 F (36.6 C), temperature source Temporal, resp. rate 18, height 5\' 5"  (1.651 m), weight 144 lb (65.3 kg), SpO2 100 %.      ECOG: 1     General appearance: Comfortable appearing without any discomfort Head: Normocephalic without any trauma Oropharynx: Mucous membranes are moist and pink without any thrush or ulcers. Eyes: Pupils are equal and round reactive to light. Lymph nodes: No cervical, supraclavicular, inguinal or axillary lymphadenopathy.   Heart:regular rate and rhythm.  S1 and S2 without leg edema. Lung: Clear without any rhonchi or wheezes.  No dullness to percussion. Abdomin: Soft, nontender, nondistended with good  bowel sounds.  No hepatosplenomegaly. Musculoskeletal: No joint deformity or effusion.  Full range of motion noted. Neurological: No deficits noted on motor, sensory and deep tendon reflex exam. Skin: No petechial rash or dryness.  Appeared moist.               Lab Results: Lab Results  Component Value Date   WBC 9.8 12/02/2019   HGB 13.7 12/02/2019   HCT 43.3 12/02/2019   MCV 96.4 12/02/2019   PLT 256 12/02/2019     Chemistry      Component Value Date/Time   NA 139 12/02/2019 1239   NA 138 12/05/2016 0750   K 4.4 12/02/2019 1239   CL 103 12/02/2019 1239   CO2 30 12/02/2019 1239    BUN 16 12/02/2019 1239   BUN 15 12/05/2016 0750   CREATININE 0.69 12/02/2019 1239   CREATININE 0.70 09/05/2015 0828      Component Value Date/Time   CALCIUM 8.9 12/02/2019 1239   ALKPHOS 87 12/02/2019 1239   AST 24 12/02/2019 1239   ALT 30 12/02/2019 1239   BILITOT 0.5 12/02/2019 1239     IMPRESSION: 1. Partial metabolic response. Hypermetabolic metastases in the left presacral space and left labia have decreased in size and metabolism. Hypermetabolic lesion in the posterior proximal left thigh musculature is decreased in metabolism. Hypermetabolic right anorectal junction lesion is decreased in metabolism. No new or progressive sites of hypermetabolic metastatic disease. 2. Nonspecific new low-level hypermetabolism in the right breast at the site of presumed post lumpectomy change, which appears unchanged on the CT images. Consider diagnostic mammographic evaluation if not recently performed. 3.  Aortic Atherosclerosis (ICD10-I70.0).     Impression and Plan:  79 year old with:  1.   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  She was found to have stage IV disease with pelvic involvement as well as left labia.   She continues to tolerate Pembrolizumab without any major complications.  Imaging studies obtained in January 2021 reported partial metabolic response at the area of metastasis.  Risks and benefits of continuing this therapy were reviewed today plan to repeat imaging studies in April 2021.  Duration of therapy is to be determined at this time but favor continuing therapy which he achieves a complete response or toxicity.  She is agreeable to continue at this time.   2.  Immune mediated complications: I continue to educate her about potential complications including pneumonitis, colitis or thyroid disease.  Last TSH is within normal range.  3. Dermatology surveillance: No recent lesions noted she continues to follow with dermatology.  4.   Antiemetics: No nausea or vomiting reported at this time antiemetics are available to her.  5.  IV access: No issues with peripheral veins at this time.  Port-A-Cath will be deferred as needed for the time being.  6. Follow-up: In 3 weeks for the next cycle of therapy.  30 minutes were dedicated to the visit.  This time was spent on reviewing her laboratory data, reviewing imaging studies, addressing complications of therapy and future plan of care.      Zola Button, MD 3/18/20213:02 PM

## 2019-12-24 ENCOUNTER — Telehealth: Payer: Self-pay | Admitting: Oncology

## 2019-12-24 LAB — TSH: TSH: 1.972 u[IU]/mL (ref 0.308–3.960)

## 2019-12-24 NOTE — Telephone Encounter (Signed)
Scheduled appt per 3/18 los.  

## 2020-01-07 NOTE — Progress Notes (Signed)

## 2020-01-13 ENCOUNTER — Inpatient Hospital Stay: Payer: Medicare Other

## 2020-01-13 ENCOUNTER — Inpatient Hospital Stay: Payer: Medicare Other | Attending: Oncology

## 2020-01-13 ENCOUNTER — Other Ambulatory Visit: Payer: Self-pay

## 2020-01-13 ENCOUNTER — Telehealth: Payer: Self-pay | Admitting: Oncology

## 2020-01-13 ENCOUNTER — Inpatient Hospital Stay (HOSPITAL_BASED_OUTPATIENT_CLINIC_OR_DEPARTMENT_OTHER): Payer: Medicare Other | Admitting: Oncology

## 2020-01-13 VITALS — BP 136/67 | HR 71 | Temp 98.3°F | Resp 18 | Ht 65.0 in | Wt 143.8 lb

## 2020-01-13 DIAGNOSIS — C211 Malignant neoplasm of anal canal: Secondary | ICD-10-CM | POA: Diagnosis not present

## 2020-01-13 DIAGNOSIS — C7989 Secondary malignant neoplasm of other specified sites: Secondary | ICD-10-CM | POA: Insufficient documentation

## 2020-01-13 DIAGNOSIS — R928 Other abnormal and inconclusive findings on diagnostic imaging of breast: Secondary | ICD-10-CM | POA: Insufficient documentation

## 2020-01-13 DIAGNOSIS — C7982 Secondary malignant neoplasm of genital organs: Secondary | ICD-10-CM | POA: Insufficient documentation

## 2020-01-13 DIAGNOSIS — Z5112 Encounter for antineoplastic immunotherapy: Secondary | ICD-10-CM | POA: Diagnosis not present

## 2020-01-13 DIAGNOSIS — C50919 Malignant neoplasm of unspecified site of unspecified female breast: Secondary | ICD-10-CM | POA: Diagnosis not present

## 2020-01-13 DIAGNOSIS — Z853 Personal history of malignant neoplasm of breast: Secondary | ICD-10-CM | POA: Insufficient documentation

## 2020-01-13 LAB — CBC WITH DIFFERENTIAL (CANCER CENTER ONLY)
Abs Immature Granulocytes: 0.04 10*3/uL (ref 0.00–0.07)
Basophils Absolute: 0.1 10*3/uL (ref 0.0–0.1)
Basophils Relative: 1 %
Eosinophils Absolute: 0.6 10*3/uL — ABNORMAL HIGH (ref 0.0–0.5)
Eosinophils Relative: 8 %
HCT: 43 % (ref 36.0–46.0)
Hemoglobin: 13.8 g/dL (ref 12.0–15.0)
Immature Granulocytes: 1 %
Lymphocytes Relative: 21 %
Lymphs Abs: 1.6 10*3/uL (ref 0.7–4.0)
MCH: 31 pg (ref 26.0–34.0)
MCHC: 32.1 g/dL (ref 30.0–36.0)
MCV: 96.6 fL (ref 80.0–100.0)
Monocytes Absolute: 0.8 10*3/uL (ref 0.1–1.0)
Monocytes Relative: 10 %
Neutro Abs: 4.4 10*3/uL (ref 1.7–7.7)
Neutrophils Relative %: 59 %
Platelet Count: 283 10*3/uL (ref 150–400)
RBC: 4.45 MIL/uL (ref 3.87–5.11)
RDW: 13.8 % (ref 11.5–15.5)
WBC Count: 7.5 10*3/uL (ref 4.0–10.5)
nRBC: 0 % (ref 0.0–0.2)

## 2020-01-13 LAB — CMP (CANCER CENTER ONLY)
ALT: 18 U/L (ref 0–44)
AST: 18 U/L (ref 15–41)
Albumin: 3.7 g/dL (ref 3.5–5.0)
Alkaline Phosphatase: 70 U/L (ref 38–126)
Anion gap: 9 (ref 5–15)
BUN: 12 mg/dL (ref 8–23)
CO2: 28 mmol/L (ref 22–32)
Calcium: 8.9 mg/dL (ref 8.9–10.3)
Chloride: 105 mmol/L (ref 98–111)
Creatinine: 0.73 mg/dL (ref 0.44–1.00)
GFR, Est AFR Am: >60 mL/min (ref >60)
GFR, Estimated: >60 mL/min (ref >60)
Glucose, Bld: 86 mg/dL (ref 70–99)
Potassium: 4.3 mmol/L (ref 3.5–5.1)
Sodium: 142 mmol/L (ref 135–145)
Total Bilirubin: 0.4 mg/dL (ref 0.3–1.2)
Total Protein: 6.4 g/dL — ABNORMAL LOW (ref 6.5–8.1)

## 2020-01-13 MED ORDER — SODIUM CHLORIDE 0.9 % IV SOLN
200.0000 mg | Freq: Once | INTRAVENOUS | Status: AC
  Administered 2020-01-13: 11:00:00 200 mg via INTRAVENOUS
  Filled 2020-01-13: qty 8

## 2020-01-13 MED ORDER — SODIUM CHLORIDE 0.9 % IV SOLN
Freq: Once | INTRAVENOUS | Status: AC
  Filled 2020-01-13: qty 250

## 2020-01-13 NOTE — Patient Instructions (Signed)
[facility] Discharge Instructions for Patients Receiving Chemotherapy  Today you received the following chemotherapy agents:  Keytruda.  To help prevent nausea and vomiting after your treatment, we encourage you to take your nausea medication as directed.   If you develop nausea and vomiting that is not controlled by your nausea medication, call the clinic.   BELOW ARE SYMPTOMS THAT SHOULD BE REPORTED IMMEDIATELY:  *FEVER GREATER THAN 100.5 F  *CHILLS WITH OR WITHOUT FEVER  NAUSEA AND VOMITING THAT IS NOT CONTROLLED WITH YOUR NAUSEA MEDICATION  *UNUSUAL SHORTNESS OF BREATH  *UNUSUAL BRUISING OR BLEEDING  TENDERNESS IN MOUTH AND THROAT WITH OR WITHOUT PRESENCE OF ULCERS  *URINARY PROBLEMS  *BOWEL PROBLEMS  UNUSUAL RASH Items with * indicate a potential emergency and should be followed up as soon as possible.  Feel free to call the clinic should you have any questions or concerns. The clinic phone number is [phone].  Please show the CHEMO ALERT CARD at check-in to the Emergency Department and triage nurse.    

## 2020-01-13 NOTE — Telephone Encounter (Signed)
Scheduled appt per 4/8 los.  Patient will get an updated appt calendar in treatment.

## 2020-01-13 NOTE — Progress Notes (Signed)
Hematology and Oncology Follow Up Visit  Angela Carpenter [id number] 22-May-1941 79 y.o. 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  She presented with anal lesion indicating a primary tumor.  Prior Therapy:   She is status post perianal excision in August 2020.  Current therapy: Pembrolizumab 200 mg every 3 weeks started on July 29, 2019.  She is here for cycle 9.  Interim History: Angela Carpenter presents today for a follow-up visit.  Since her last visit, she reports no major changes in her health.  She continues to tolerate Pembrolizumab without any complaints.  Her hair loss has subsided at this time.  She denies any nausea, vomiting or abdominal pain.  She denies any diarrhea or mucus production.  She denies any respiratory complaints clinic cough wheezing mopped assist.  She denies any joint pain or stiffness.  She denies any skin rashes or lesions.         Medications: Updated on review. Current Outpatient Medications  Medication Sig Dispense Refill  . acyclovir (ZOVIRAX) 400 MG tablet Take 400 mg by mouth 2 (two) times daily.    Marland Kitchen alendronate (FOSAMAX) 70 MG tablet Take 70 mg by mouth once a week. Take with a full glass of water on an empty stomach.    Marland Kitchen atorvastatin (LIPITOR) 10 MG tablet TAKE 1 TABLET DAILY 90 tablet 0  . Cholecalciferol (VITAMIN D-3) 125 MCG (5000 UT) TABS Take 5,000 Units by mouth daily.     Marland Kitchen ELIQUIS 5 MG TABS tablet TAKE 1 TABLET TWICE A DAY 180 tablet 1  . metoprolol succinate (TOPROL-XL) 25 MG 24 hr tablet TAKE 1 TABLET TWICE A DAY 180 tablet 0  . Multiple Vitamins-Minerals (PRESERVISION AREDS 2+MULTI VIT) CAPS Take 1 capsule by mouth 2 (two) times daily.    . prochlorperazine (COMPAZINE) 10 MG tablet Take 1 tablet (10 mg total) by mouth every 6 (six) hours as needed  for nausea or vomiting. 30 tablet 0  . raloxifene (EVISTA) 60 MG tablet Take 60 mg by mouth daily.    . timolol (TIMOPTIC) 0.5 % ophthalmic solution Place 1 drop into both eyes 2 (two) times daily.    . traMADol (ULTRAM) 50 MG tablet Take 1 tablet (50 mg total) by mouth every 6 (six) hours as needed. 15 tablet 0   Current Facility-Administered Medications  Medication Dose Route Frequency Provider Last Rate Last Admin  . 0.9 %  sodium chloride infusion  500 mL Intravenous Continuous Danis, Estill Cotta III, MD         Allergies: No Known Allergies     Physical Exam:   Blood pressure 136/67, pulse 71, temperature 98.3 F (36.8 C), temperature source Temporal, resp. rate 18, height 5\' 5"  (1.651 m), weight 143 lb 12.8 oz (65.2 kg), SpO2 99 %.       ECOG: 1   General appearance: Alert, awake without any distress. Head: Atraumatic without abnormalities Oropharynx: Without any thrush or ulcers. Eyes: No scleral icterus. Lymph nodes: No lymphadenopathy noted in the cervical, supraclavicular, or axillary nodes Heart:regular rate and rhythm, without any murmurs or gallops.   Lung: Clear to auscultation without any rhonchi, wheezes or dullness to percussion. Abdomin: Soft, nontender without any shifting dullness or ascites. Musculoskeletal: No clubbing or cyanosis. Neurological: No motor or sensory deficits. Skin: No rashes or lesions.  Lab Results: Lab Results  Component Value Date   WBC 9.9 12/23/2019   HGB 14.4 12/23/2019   HCT 44.5 12/23/2019   MCV 93.7 12/23/2019   PLT 284 12/23/2019     Chemistry      Component Value Date/Time   NA 139 12/23/2019 1450   NA 138 12/05/2016 0750   K 4.5 12/23/2019 1450   CL 101 12/23/2019 1450   CO2 27 12/23/2019 1450   BUN 14 12/23/2019 1450   BUN 15 12/05/2016 0750   CREATININE 0.70 12/23/2019 1450   CREATININE 0.70 09/05/2015 0828      Component Value Date/Time   CALCIUM 9.6 12/23/2019 1450   ALKPHOS 88  12/23/2019 1450   AST 22 12/23/2019 1450   ALT 19 12/23/2019 1450   BILITOT 0.5 12/23/2019 1450          Impression and Plan:  79 year old with:  1.   Stage IV mucosal melanoma diagnosed in August 2020.  She presented with anal lesion with metastasis to the labia as well as pelvic nodule.  She remains on Pembrolizumab without any major complaints at this time.  She had an excellent tolerance and response to therapy based on imaging studies in January 2021.  The natural course of this disease was reviewed and different salvage therapy options were discussed beyond immunotherapy.  At this time I recommended continuing the same dose and schedule of this current regimen and repeat imaging studies in 3 weeks.  She is agreeable to proceed.   2.  Immune mediated complications: I continue to educate her about potential complications including pneumonitis, colitis, thyroid disease as well as arthritis.  3. Dermatology surveillance: I recommended continued circular dermatology surveillance.  No skin issues noted.  4.  Antiemetics: Compazine is available to her without any recent nausea or vomiting.  5.  IV access: She continues to prefer peripheral veins for the time being without any major issues.  6.  Breast cancer: Was diagnosed in 2006.  Her last PET scan did show abnormalities in the breast and suggested repeating a diagnostic mammography which we will arrange for b next month.  7.  Follow-up: She will return in 3 weeks for repeat evaluation after imaging studies.  30 minutes were spent on this encounter.  The time was dedicated to updating her disease status, reviewing complications related to therapy, alternative treatment options and future plan of care discussion.      Zola Button, MD 4/8/20219:20 AM

## 2020-01-14 ENCOUNTER — Other Ambulatory Visit: Payer: Self-pay | Admitting: Oncology

## 2020-01-14 DIAGNOSIS — R928 Other abnormal and inconclusive findings on diagnostic imaging of breast: Secondary | ICD-10-CM

## 2020-01-14 DIAGNOSIS — C50919 Malignant neoplasm of unspecified site of unspecified female breast: Secondary | ICD-10-CM

## 2020-01-14 DIAGNOSIS — C211 Malignant neoplasm of anal canal: Secondary | ICD-10-CM

## 2020-01-17 ENCOUNTER — Other Ambulatory Visit: Payer: Self-pay

## 2020-01-17 ENCOUNTER — Ambulatory Visit
Admission: RE | Admit: 2020-01-17 | Discharge: 2020-01-17 | Disposition: A | Discharge status: Home / Self Care | Payer: Medicare Other | Source: Ambulatory Visit | Attending: Oncology | Admitting: Oncology

## 2020-01-17 ENCOUNTER — Other Ambulatory Visit: Payer: Self-pay | Admitting: Oncology

## 2020-01-17 DIAGNOSIS — C211 Malignant neoplasm of anal canal: Secondary | ICD-10-CM

## 2020-01-17 DIAGNOSIS — R928 Other abnormal and inconclusive findings on diagnostic imaging of breast: Secondary | ICD-10-CM

## 2020-01-17 DIAGNOSIS — N6489 Other specified disorders of breast: Secondary | ICD-10-CM | POA: Diagnosis not present

## 2020-01-17 DIAGNOSIS — Z853 Personal history of malignant neoplasm of breast: Secondary | ICD-10-CM | POA: Diagnosis not present

## 2020-01-17 DIAGNOSIS — C50919 Malignant neoplasm of unspecified site of unspecified female breast: Secondary | ICD-10-CM

## 2020-01-17 DIAGNOSIS — R922 Inconclusive mammogram: Secondary | ICD-10-CM | POA: Diagnosis not present

## 2020-01-17 IMAGING — US US BREAST*R* LIMITED INC AXILLA
1 series · 4 of 4 positions shown · non-contrast
Comparison: Previous exam(s).

CLINICAL DATA: 78-year-old female with history of right breast
cancer in [EP] status post lumpectomy and radiation. History of anal
cancer (melanoma) diagnosed [DATE]. Patient had a recent head
CT scan demonstrating new activity at the right lumpectomy site.

EXAM:
DIGITAL DIAGNOSTIC RIGHT MAMMOGRAM WITH TOMO
ULTRASOUND RIGHT BREAST

[Series 1: us breast*right* limited inc axilla · 0.06mm/px · 4 of 4 slices shown]
[im 1/4]
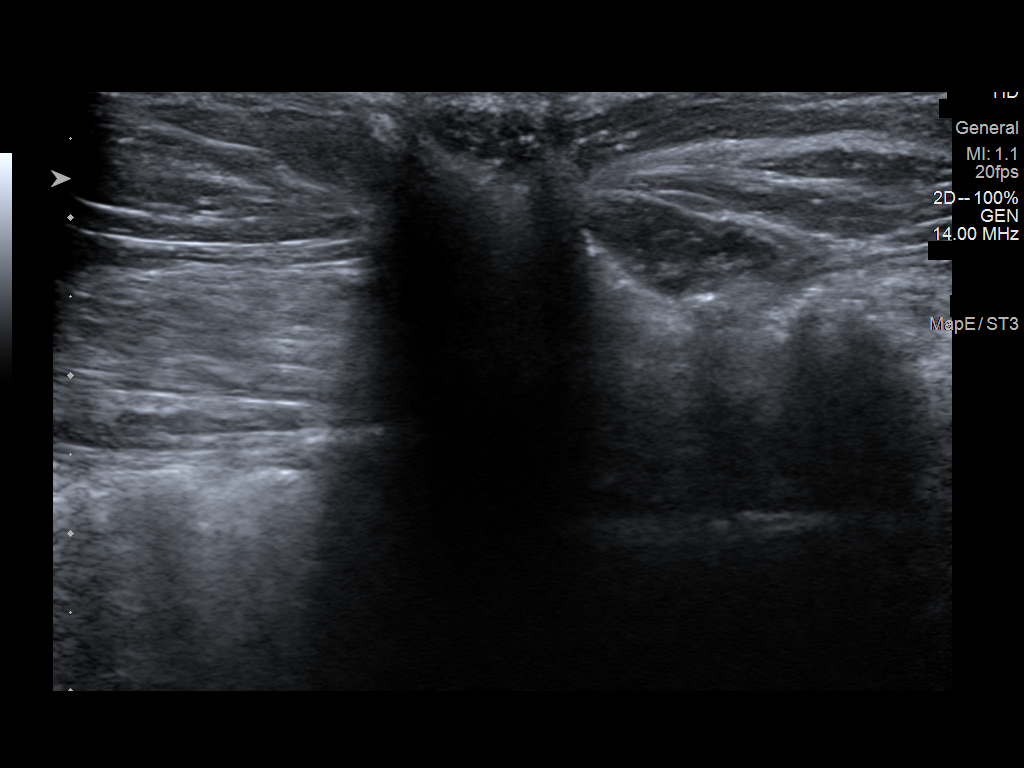
[im 2/4]
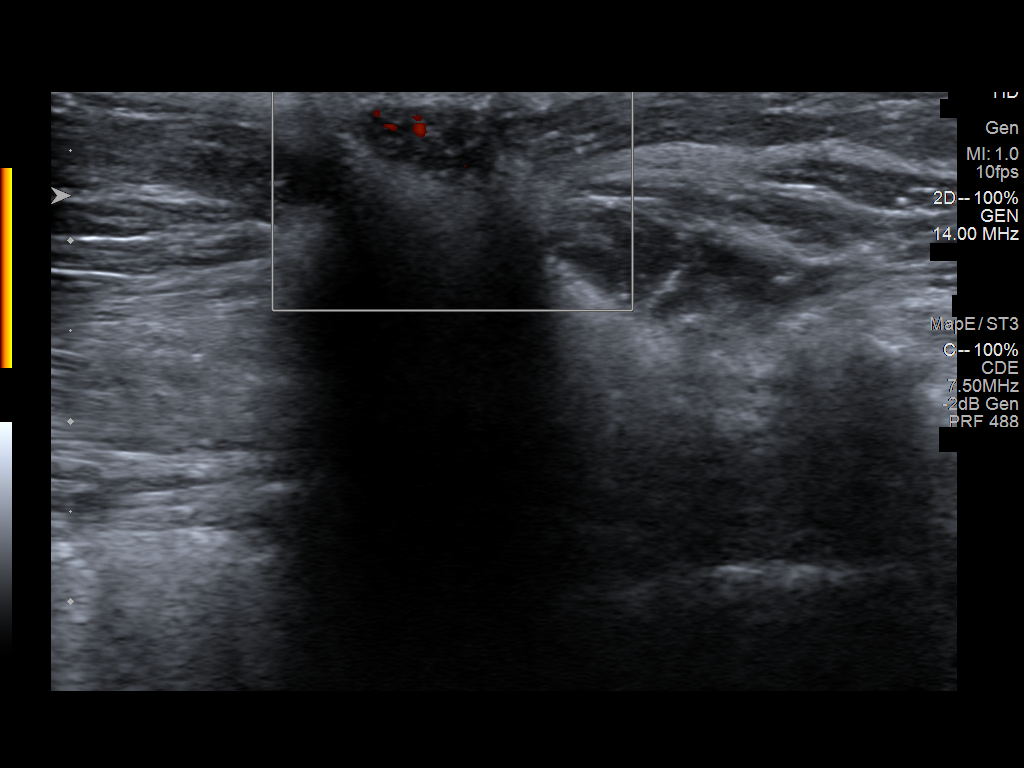
[im 3/4]
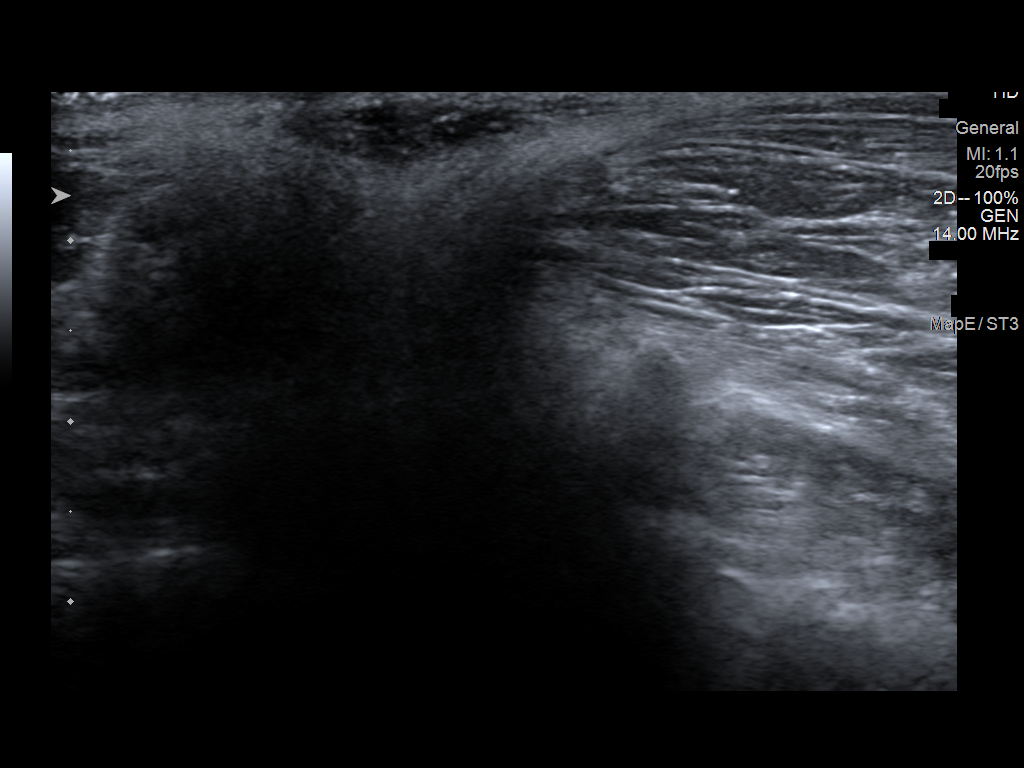
[im 4/4]
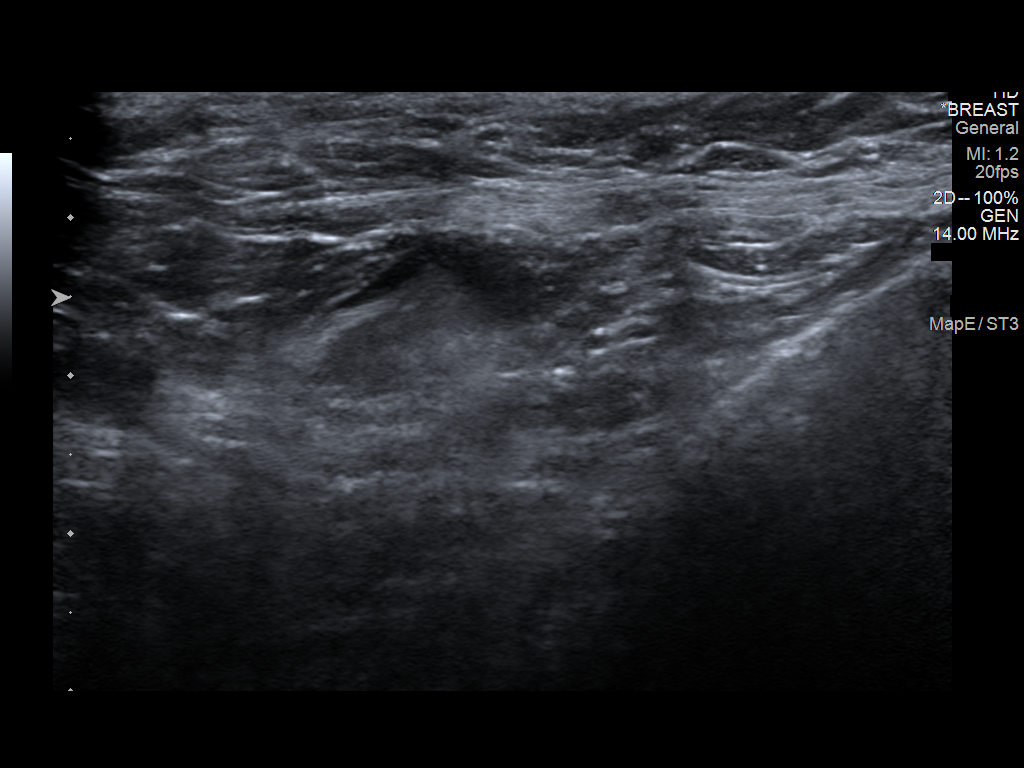

[4 of 4 positions shown; findings below may reference images not displayed]

ACR Breast Density Category c: The breast tissue is heterogeneously
dense, which may obscure small masses.
FINDINGS: Mammogram:

At the lumpectomy site in the superior and far posterior aspect of
the breast there is again dystrophic calcification, increased
density and distortion which appear similar compared to prior
mammograms. There may be a possible subtle increase in soft tissue
density at the superior aspect of the lumpectomy site.

Ultrasound:

Targeted ultrasound is performed at the lumpectomy site at 11
o'clock 3 cm from the nipple demonstrating hypoechoic scar tissue
with associated shadowing as well as coarse shadowing
calcifications. There is no definite discrete suspicious mass
separate from the lumpectomy site scarring. Targeted ultrasound of
the right axilla demonstrates normal-appearing lymph nodes.
IMPRESSION: At the lumpectomy site in the right superior breast there may be
slight increase in soft tissue density, however there is no definite
sonographic correlate. There is new activity on a recent PET-CT
localizing to the lumpectomy site which raises concern for
recurrence.

RECOMMENDATION:
1. Stereotactic core needle biopsy of the right lumpectomy scar site
targeting the soft tissue portion at the superior aspect.

2. If the core needle biopsy is benign, consider breast MRI for
further evaluation. The patient also stated that she is due for a
repeat PET-CT scan later this month. If the biopsy is benign, the
lumpectomy site could instead be re-evaluated on the follow-up PET
scan.

I have discussed the findings and recommendations with the patient.
If applicable, a reminder letter will be sent to the patient
regarding the next appointment.

BI-RADS CATEGORY  4: Suspicious.

## 2020-01-17 IMAGING — MG MM DIGITAL DIAGNOSTIC UNILAT*R* W/ TOMO W/ CAD
6 series · 6 of 18 positions shown · non-contrast
Comparison: Previous exam(s).

CLINICAL DATA: 78-year-old female with history of right breast
cancer in [EP] status post lumpectomy and radiation. History of anal
cancer (melanoma) diagnosed [DATE]. Patient had a recent head
CT scan demonstrating new activity at the right lumpectomy site.

EXAM:
DIGITAL DIAGNOSTIC RIGHT MAMMOGRAM WITH TOMO
ULTRASOUND RIGHT BREAST

[R MLO synth-2D (1 of 2)]
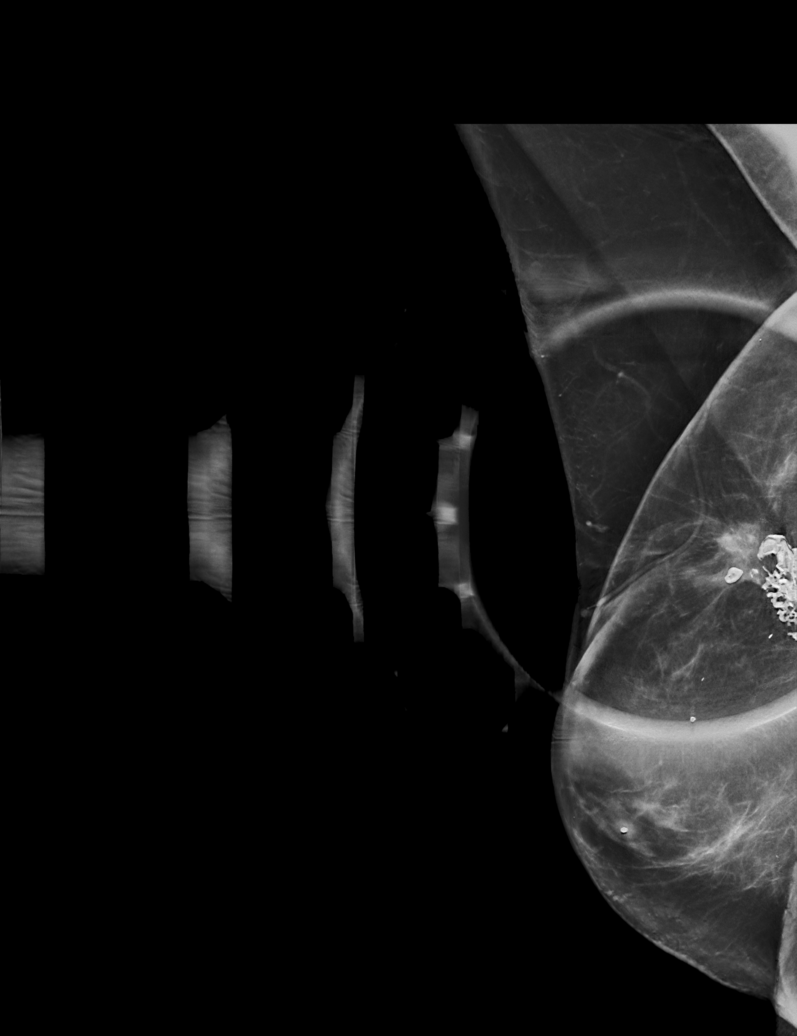

[R CC synth-2D]
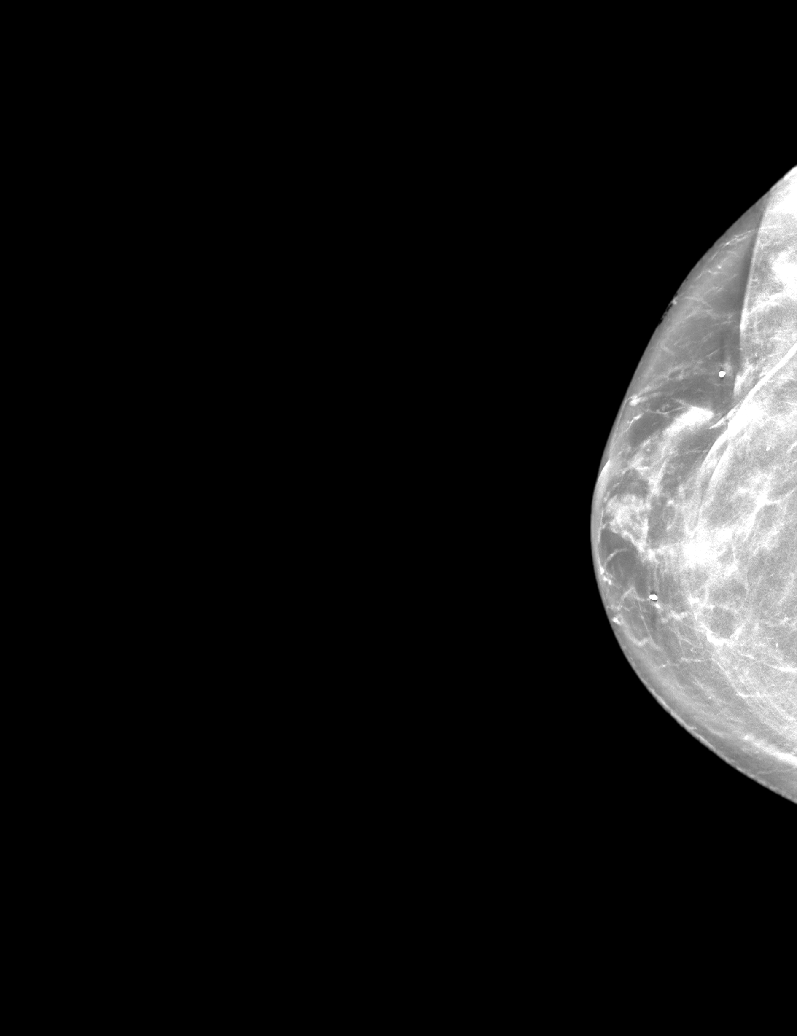

[R MLO synth-2D (2 of 2)]
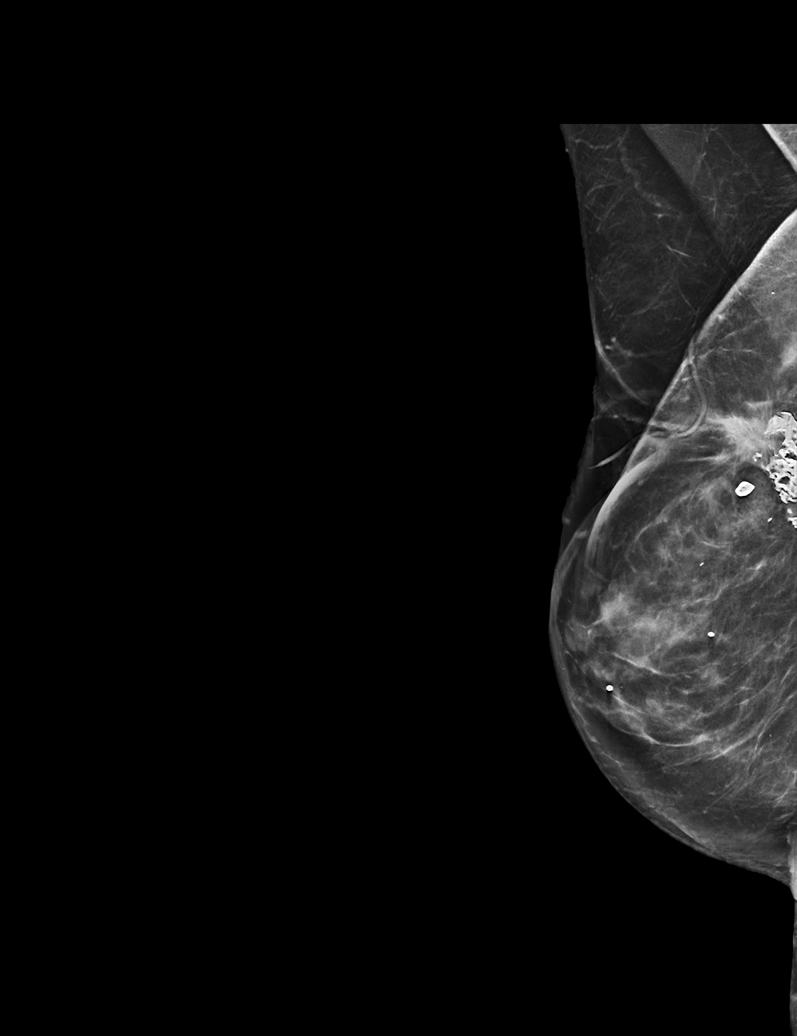

[R MLO tomo (1 of 2) · tomo slice 33/66.0]
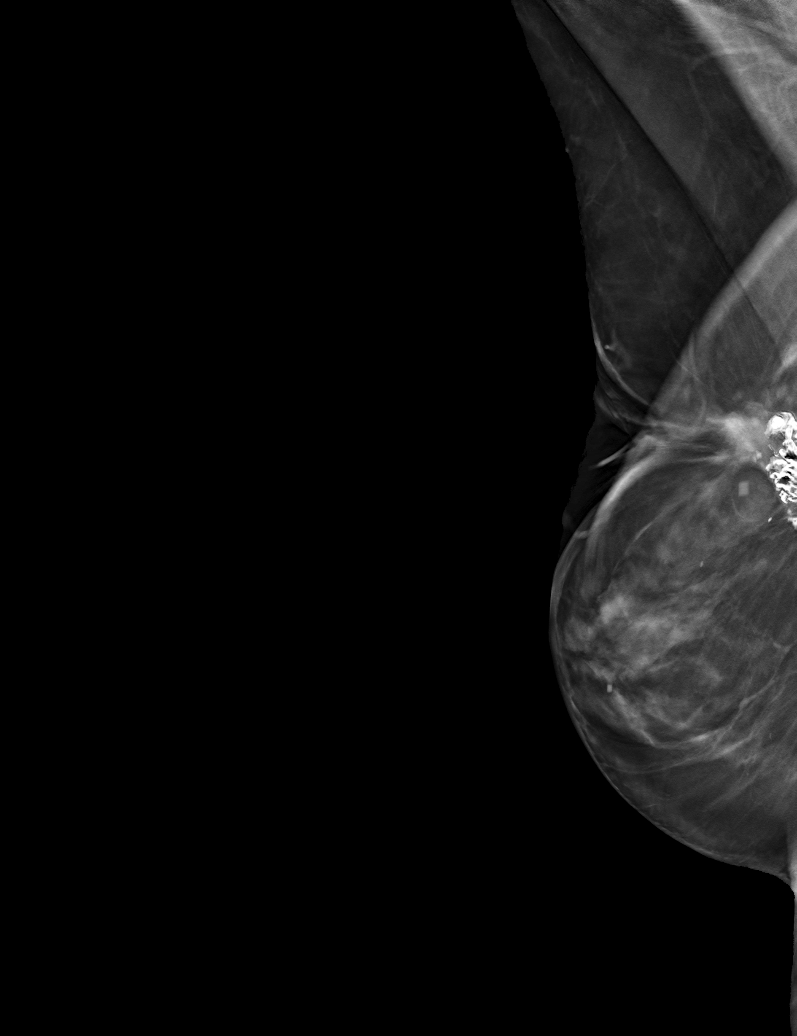

[R CC tomo · tomo slice 32/63.0]
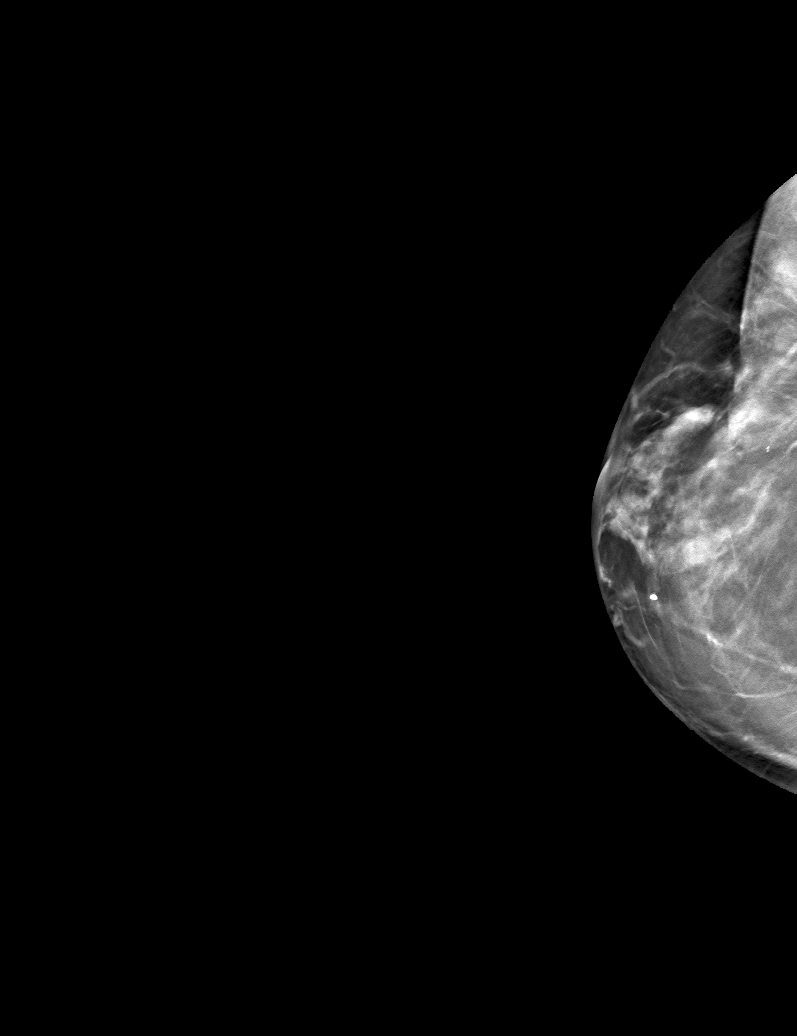

[R MLO tomo (2 of 2) · tomo slice 34/67.0]
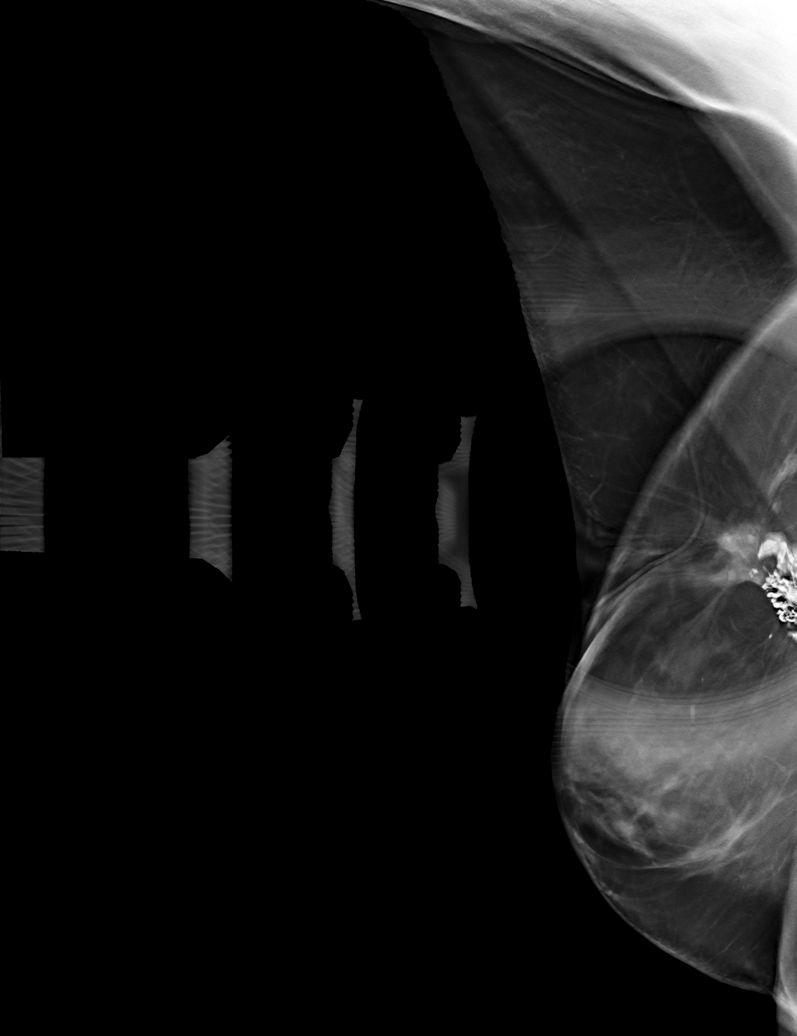

[6 of 18 positions shown; findings below may reference images not displayed]

ACR Breast Density Category c: The breast tissue is heterogeneously
dense, which may obscure small masses.
FINDINGS: Mammogram:

At the lumpectomy site in the superior and far posterior aspect of
the breast there is again dystrophic calcification, increased
density and distortion which appear similar compared to prior
mammograms. There may be a possible subtle increase in soft tissue
density at the superior aspect of the lumpectomy site.

Ultrasound:

Targeted ultrasound is performed at the lumpectomy site at 11
o'clock 3 cm from the nipple demonstrating hypoechoic scar tissue
with associated shadowing as well as coarse shadowing
calcifications. There is no definite discrete suspicious mass
separate from the lumpectomy site scarring. Targeted ultrasound of
the right axilla demonstrates normal-appearing lymph nodes.
IMPRESSION: At the lumpectomy site in the right superior breast there may be
slight increase in soft tissue density, however there is no definite
sonographic correlate. There is new activity on a recent PET-CT
localizing to the lumpectomy site which raises concern for
recurrence.

RECOMMENDATION:
1. Stereotactic core needle biopsy of the right lumpectomy scar site
targeting the soft tissue portion at the superior aspect.

2. If the core needle biopsy is benign, consider breast MRI for
further evaluation. The patient also stated that she is due for a
repeat PET-CT scan later this month. If the biopsy is benign, the
lumpectomy site could instead be re-evaluated on the follow-up PET
scan.

I have discussed the findings and recommendations with the patient.
If applicable, a reminder letter will be sent to the patient
regarding the next appointment.

BI-RADS CATEGORY  4: Suspicious.

## 2020-01-21 ENCOUNTER — Other Ambulatory Visit: Payer: Self-pay

## 2020-01-21 ENCOUNTER — Ambulatory Visit
Admission: RE | Admit: 2020-01-21 | Discharge: 2020-01-21 | Disposition: A | Discharge status: Home / Self Care | Payer: Medicare Other | Source: Ambulatory Visit | Attending: Oncology | Admitting: Oncology

## 2020-01-21 DIAGNOSIS — R928 Other abnormal and inconclusive findings on diagnostic imaging of breast: Secondary | ICD-10-CM

## 2020-01-21 DIAGNOSIS — N641 Fat necrosis of breast: Secondary | ICD-10-CM | POA: Diagnosis not present

## 2020-01-21 DIAGNOSIS — C211 Malignant neoplasm of anal canal: Secondary | ICD-10-CM

## 2020-01-21 DIAGNOSIS — C50919 Malignant neoplasm of unspecified site of unspecified female breast: Secondary | ICD-10-CM

## 2020-01-21 IMAGING — MG MM BREAST LOCALIZATION CLIP
4 series · 4 of 12 positions shown · non-contrast
Comparison: Previous exam(s).

CLINICAL DATA: Post biopsy mammogram of the right breast for clip
placement.

EXAM:
DIAGNOSTIC RIGHT MAMMOGRAM POST STEREOTACTIC BIOPSY

[R LM synth-2D]
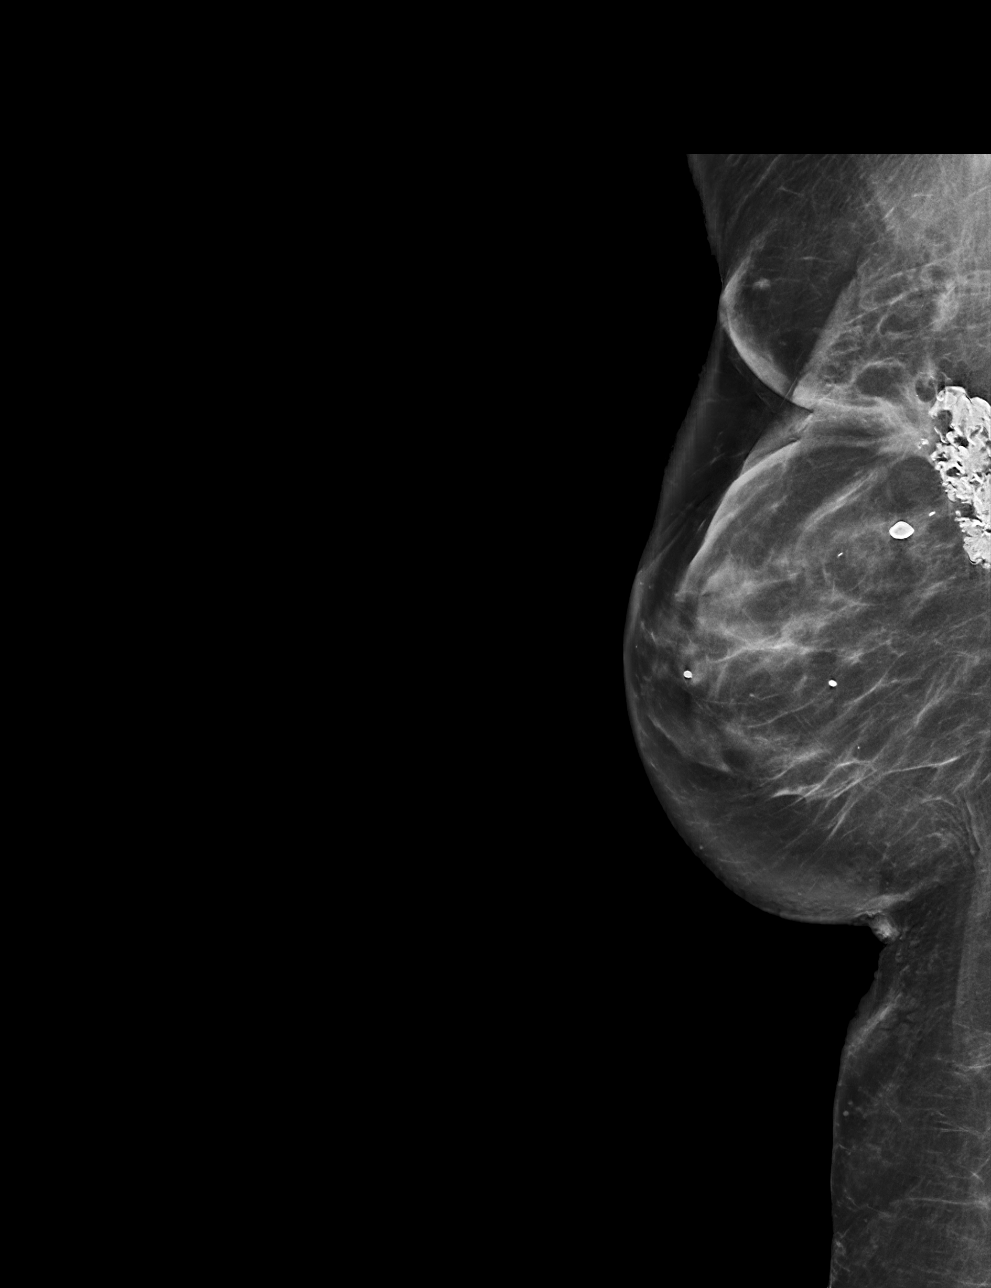

[R XCCL synth-2D]
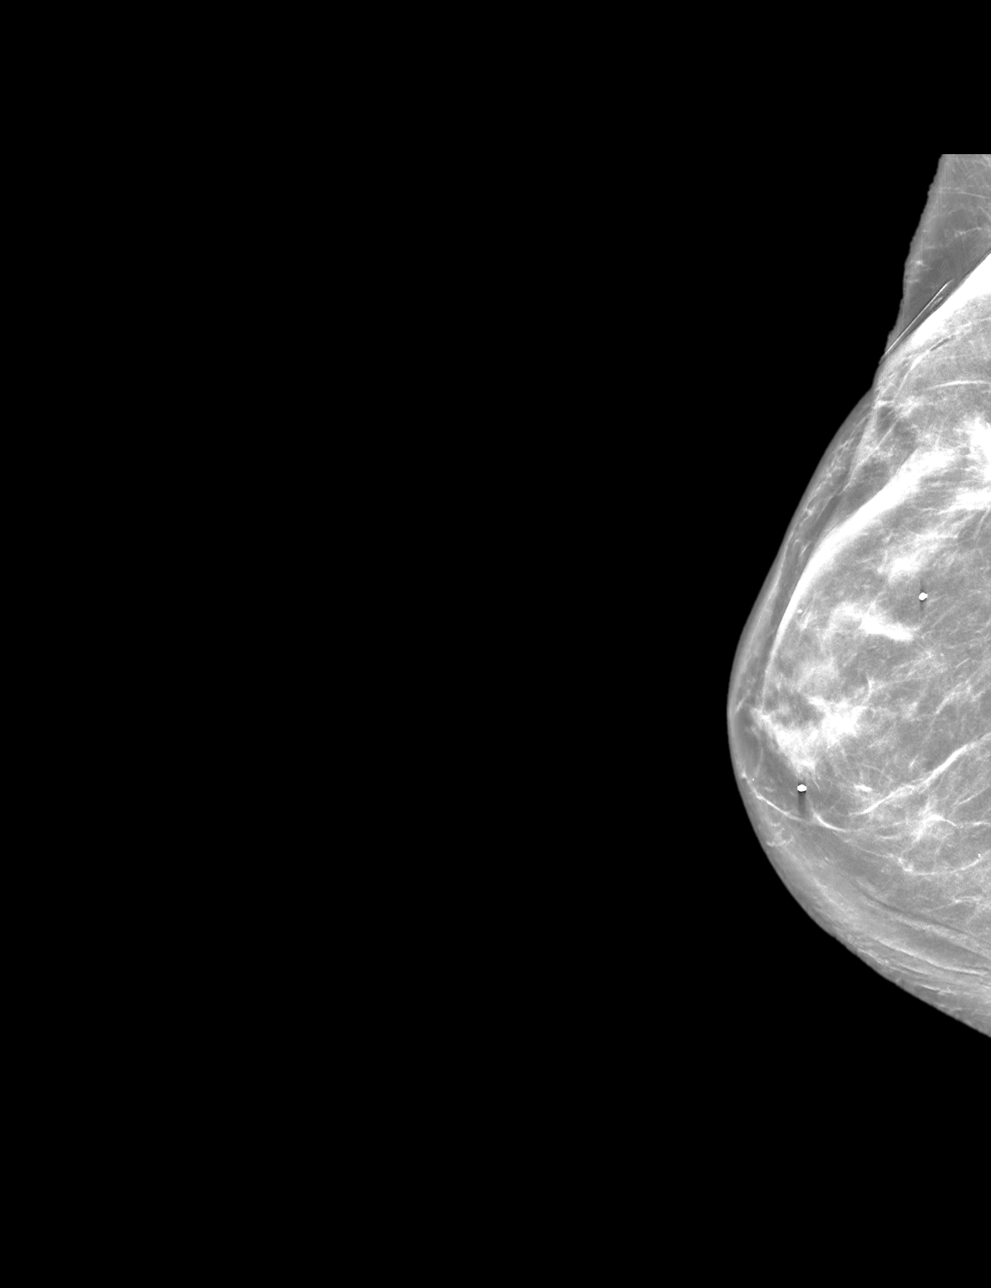

[R LM tomo · tomo slice 37/73.0]
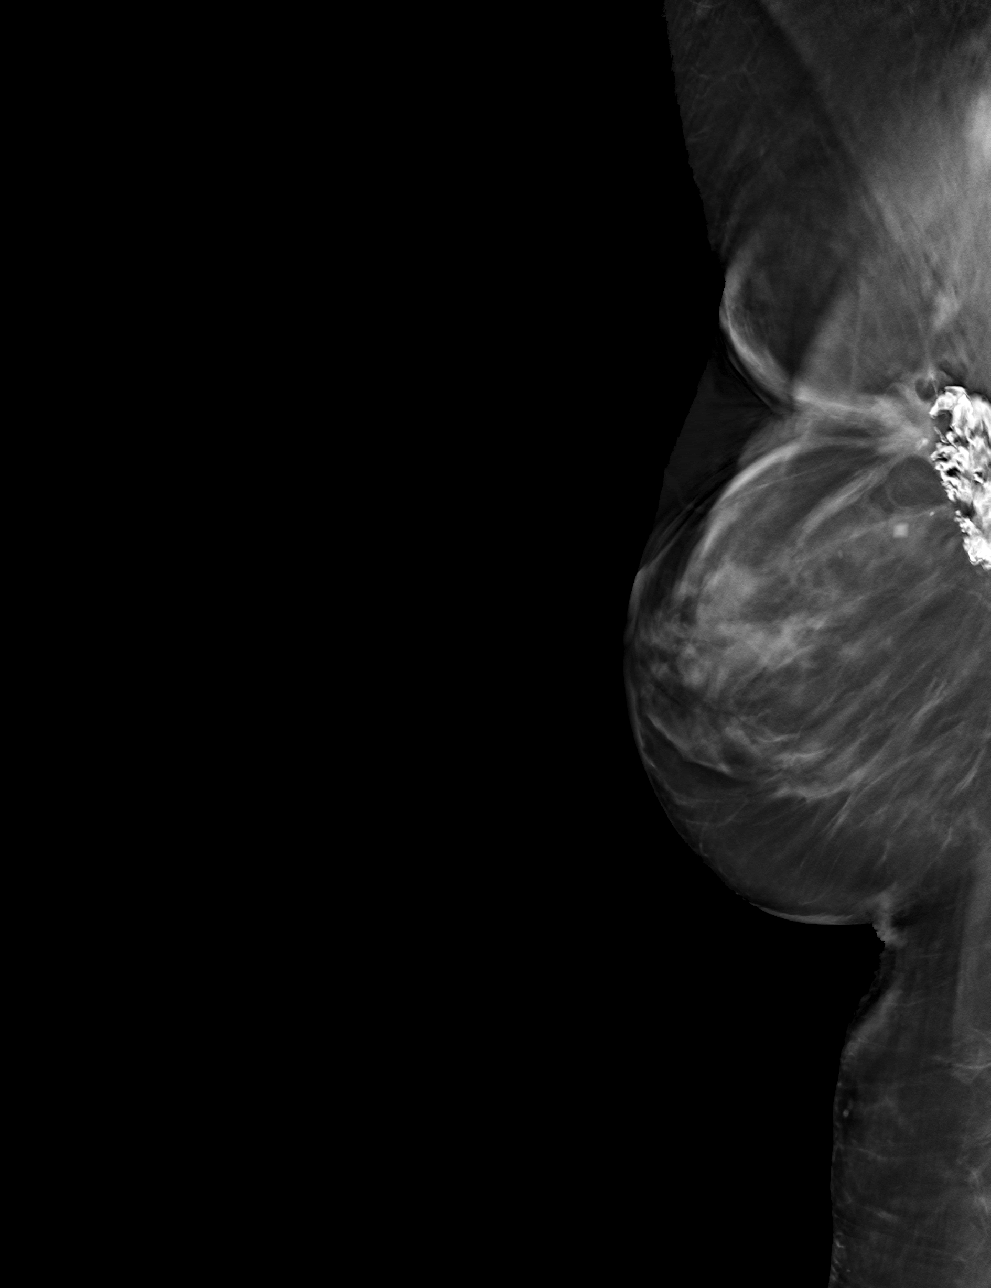

[R XCCL tomo · tomo slice 35/68.0]
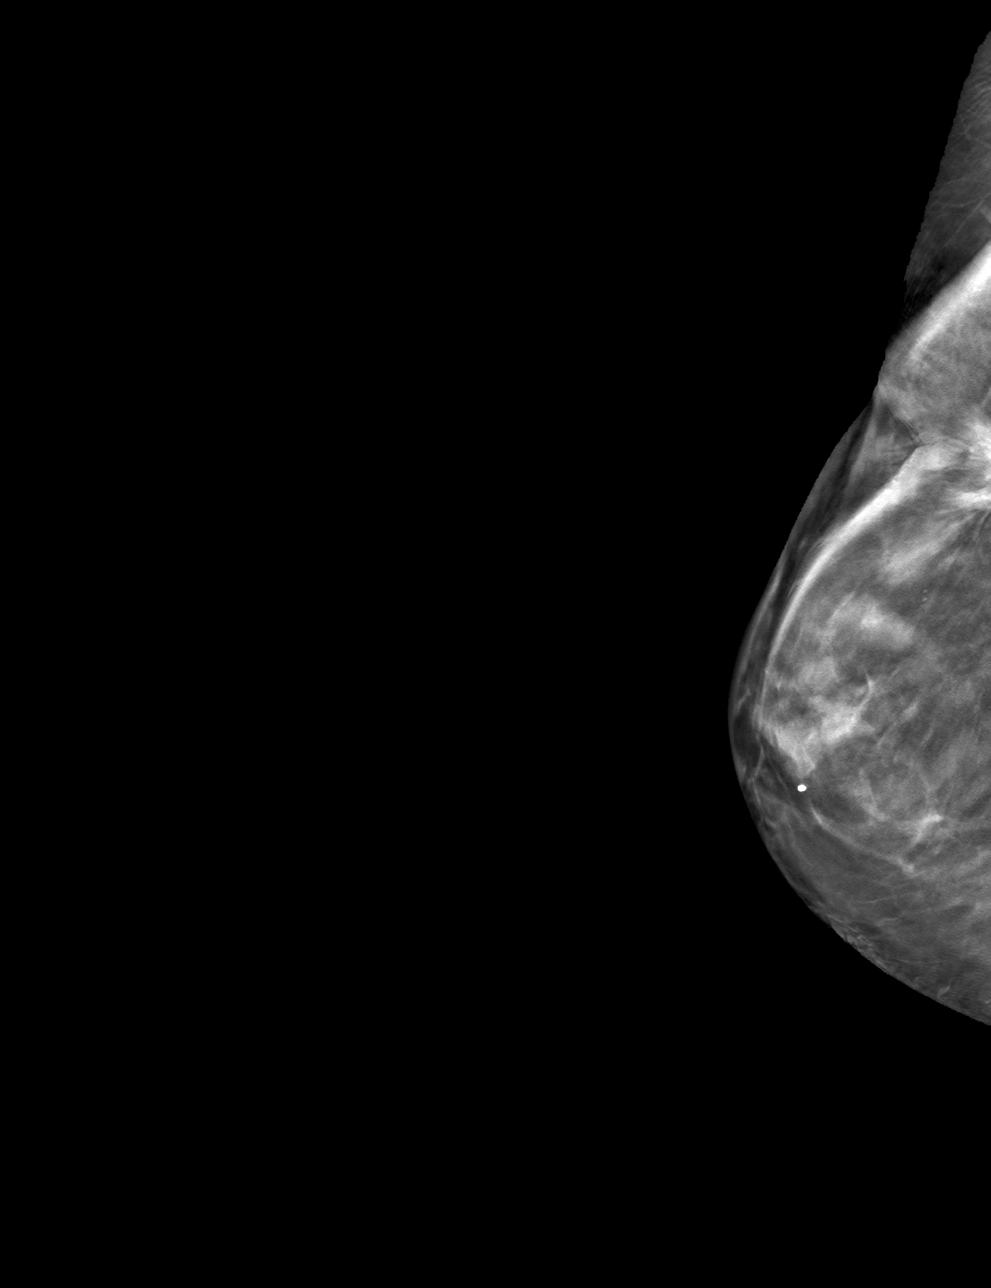

[4 of 12 positions shown; findings below may reference images not displayed]

FINDINGS: Mammographic images were obtained following stereotactic guided
biopsy of an asymmetry in the upper-outer right breast. The biopsy
marking clip is in expected position at the site of biopsy, and can
only be seen in the tomosynthesis true lateral image.
IMPRESSION: Appropriate positioning of the coil shaped biopsy marking clip at
the site of biopsy in the upper-outer right breast.

Final Assessment: Post Procedure Mammograms for Marker Placement

## 2020-01-21 IMAGING — MG MM BREAST BX W/ LOC DEV 1ST LESION IMAGE BX SPEC STEREO GUIDE*R*
8 of 28 series · 8 of 40 positions shown · non-contrast
Comparison: Previous exams.
COMPARISON: Previous exams.

Addendum:
CLINICAL DATA: 78-year-old female presenting for stereotactic
biopsy of an asymmetry at the right breast lumpectomy site.

EXAM:
RIGHT BREAST STEREOTACTIC CORE NEEDLE BIOPSY

[R (1 of 8)]
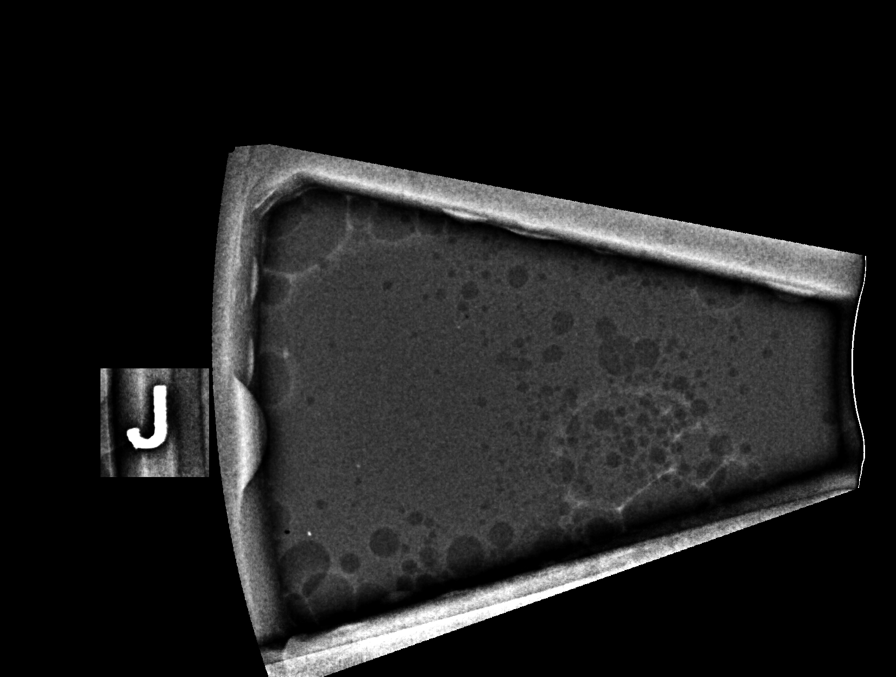

[R (2 of 8)]
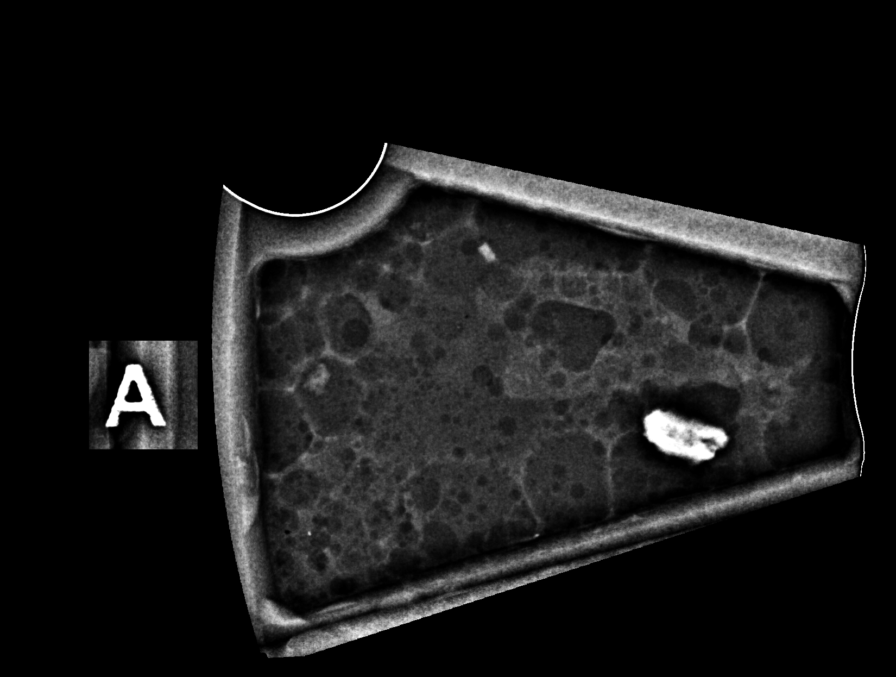

[R (3 of 8)]
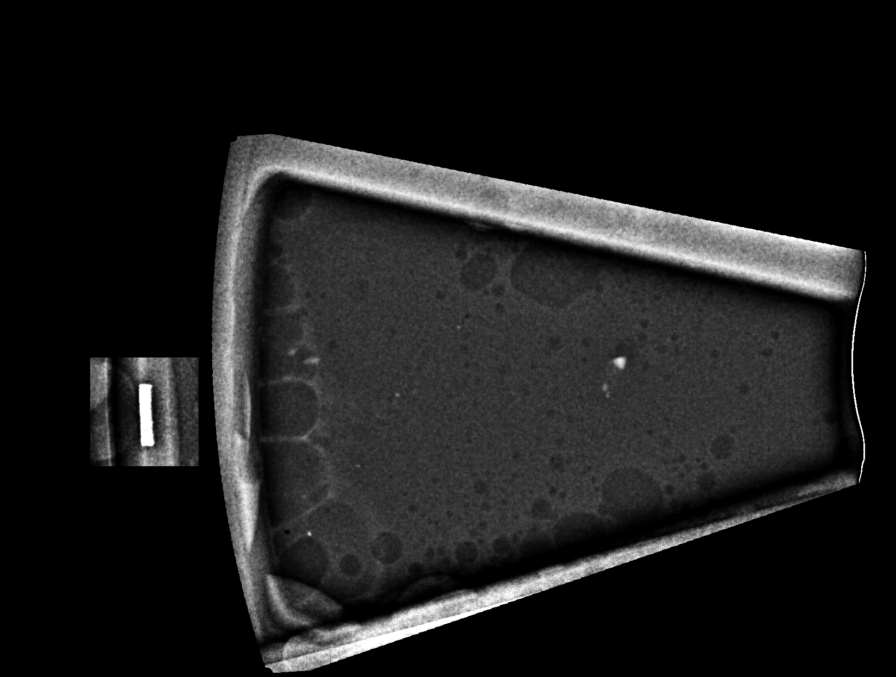

[R (4 of 8)]
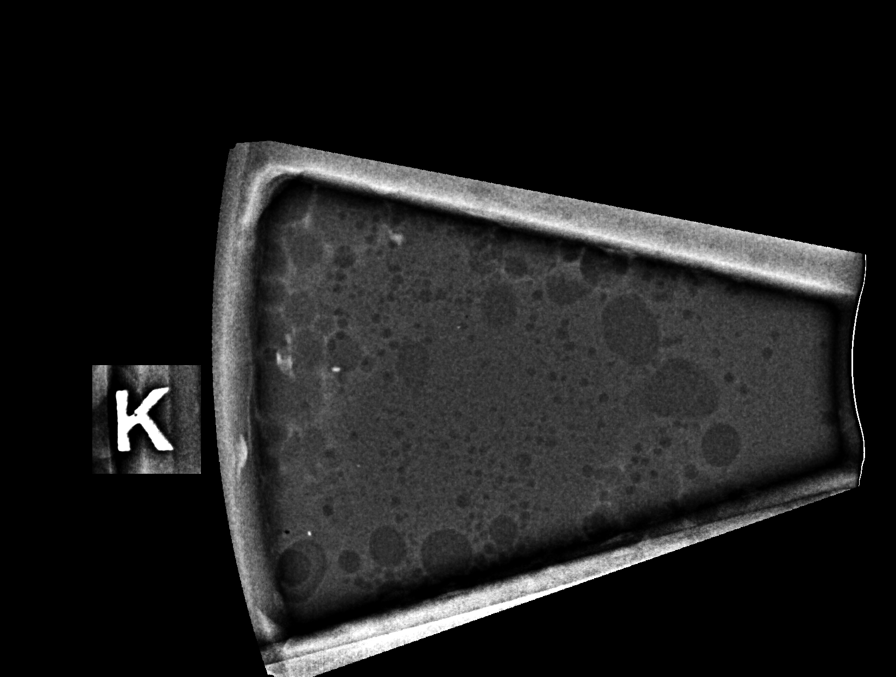

[R (5 of 8)]
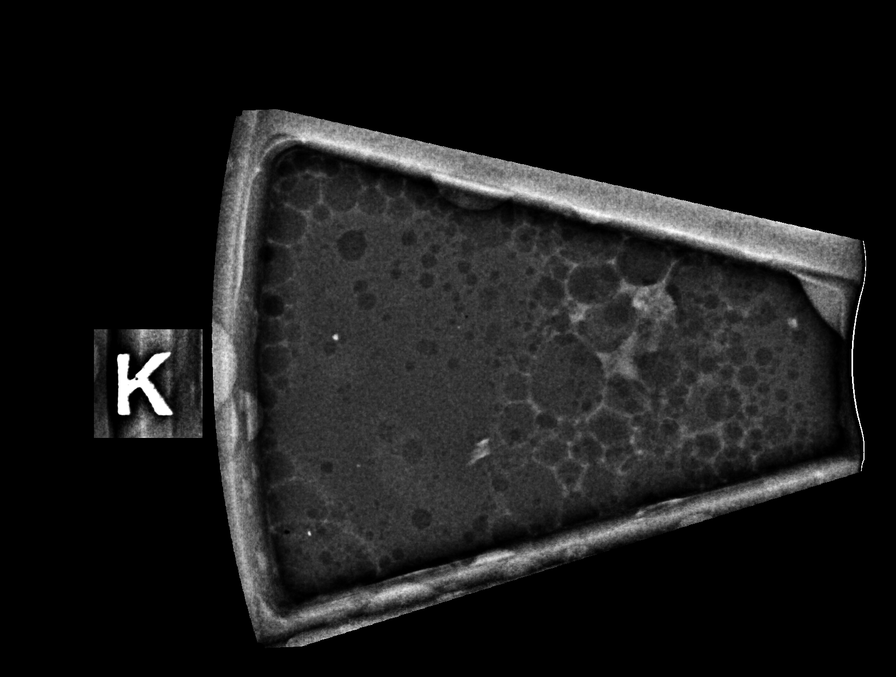

[R (6 of 8)]
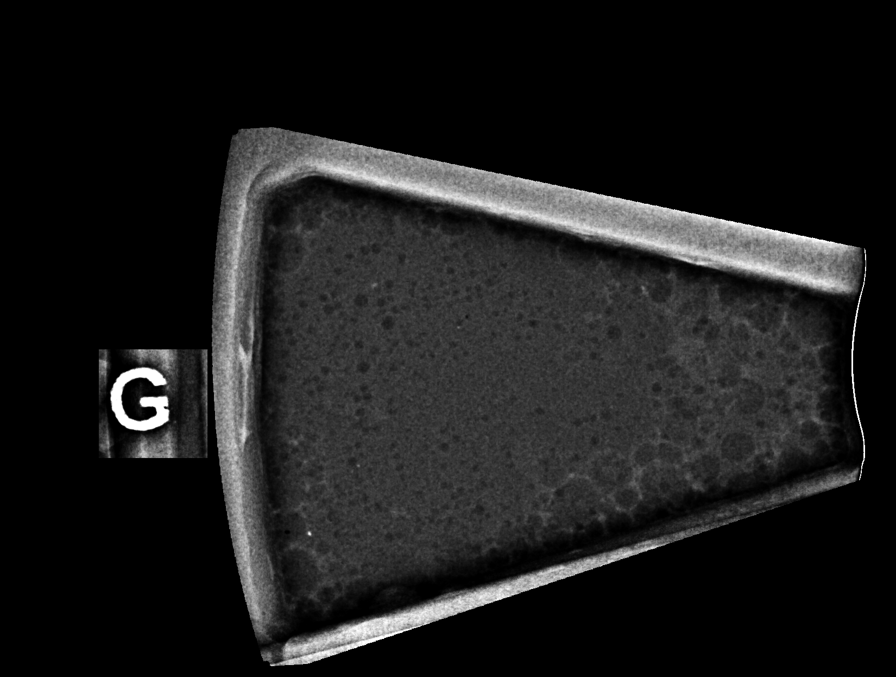

[R (7 of 8)]
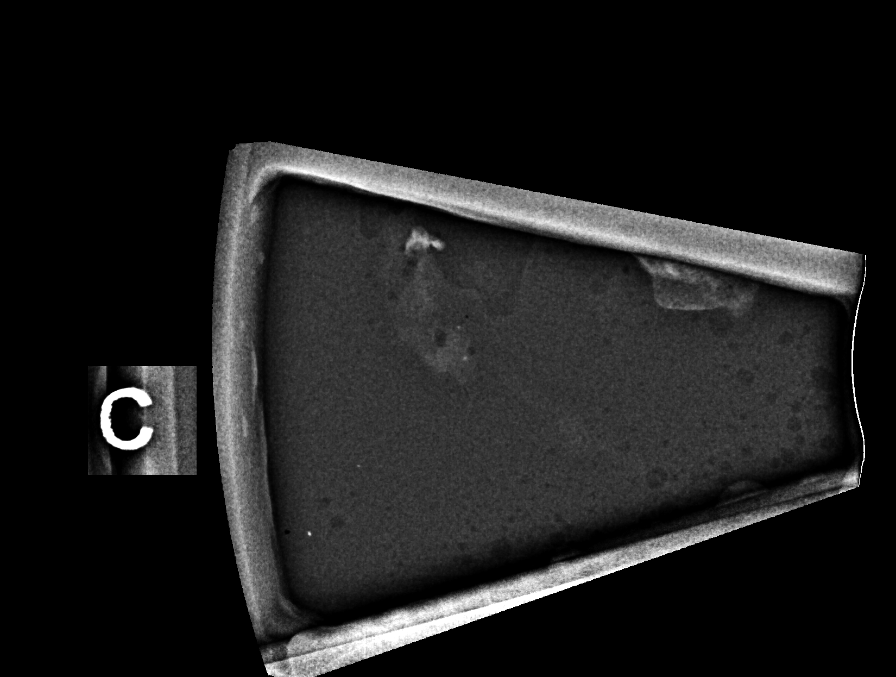

[R (8 of 8)]
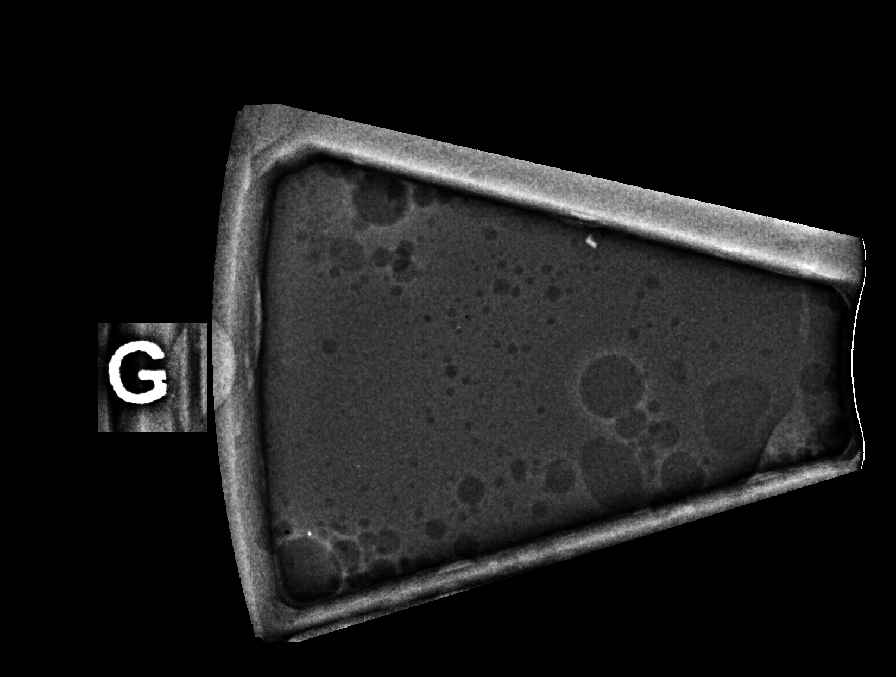

[8 of 40 positions shown; findings below may reference images not displayed]



Using sterile technique and 1% Lidocaine as local anesthetic, under
stereotactic guidance, a 9 gauge vacuum assisted device was used to
perform core needle biopsy of an asymmetry along the superior margin
the patient's lumpectomy site in the upper-outer right breast using
a lateral approach. Numerous biopsy cores were attempted to be
acquired, however only a small amount of tissue could be extracted.
I slightly withdrew the biopsy needle and attempted to takes several
additional samples with minimal yield.

Lesion quadrant: Upper-outer quadrant

At the conclusion of the procedure, coil shaped tissue marker clip
was deployed into the biopsy cavity. Follow-up 2-view mammogram was
performed and dictated separately.
IMPRESSION: Stereotactic-guided biopsy of an asymmetry in the upper-outer right
breast at the patient's lumpectomy site. No apparent complications.

ADDENDUM:
Pathology revealed ORGANIZING FAT NECROSIS WITH CALCIFICATIONS of
the Right breast,upper outer quadrant . This was found to be
concordant by Dr. CATES.

Pathology results were discussed with the patient by telephone. The
patient reported doing well after the biopsy with tenderness and
itching at the site. Post biopsy instructions and care were reviewed
and questions were answered. The patient was encouraged to call The

Consideration for bilateral breast MRI for further evaluation, and
attention on follow-up PET to the Right lumpectomy site, which is
scheduled on [DATE].

The patient was instructed to return for annual screening
mammography in [DATE]

The patient was asked to return for Right diagnostic mammography and
possible ultrasound in 6 months and informed a reminder notice would
be sent regarding this appointment.

Pathology results reported by CATES, RN on [DATE].



Using sterile technique and 1% Lidocaine as local anesthetic, under
stereotactic guidance, a 9 gauge vacuum assisted device was used to
perform core needle biopsy of an asymmetry along the superior margin
the patient's lumpectomy site in the upper-outer right breast using
a lateral approach. Numerous biopsy cores were attempted to be
acquired, however only a small amount of tissue could be extracted.
I slightly withdrew the biopsy needle and attempted to takes several
additional samples with minimal yield.

Lesion quadrant: Upper-outer quadrant

At the conclusion of the procedure, coil shaped tissue marker clip
was deployed into the biopsy cavity. Follow-up 2-view mammogram was
performed and dictated separately.
IMPRESSION: Stereotactic-guided biopsy of an asymmetry in the upper-outer right
breast at the patient's lumpectomy site. No apparent complications.

## 2020-01-31 NOTE — Progress Notes (Signed)
Pharmacist Chemotherapy Monitoring - Follow Up Assessment    I verify that I have reviewed each item in the below checklist:  . Regimen for the patient is scheduled for the appropriate day and plan matches scheduled date. Marland Kitchen Appropriate non-routine labs are ordered dependent on drug ordered. . If applicable, additional medications reviewed and ordered per protocol based on lifetime cumulative doses and/or treatment regimen.   Plan for follow-up and/or issues identified: No . I-vent associated with next due treatment: No . MD and/or nursing notified: No  Acquanetta Belling 01/31/2020 1:51 PM

## 2020-02-01 ENCOUNTER — Other Ambulatory Visit: Payer: Self-pay | Admitting: Internal Medicine

## 2020-02-01 ENCOUNTER — Other Ambulatory Visit: Payer: Self-pay

## 2020-02-01 ENCOUNTER — Ambulatory Visit (HOSPITAL_COMMUNITY)
Admission: RE | Admit: 2020-02-01 | Discharge: 2020-02-01 | Disposition: A | Discharge status: Home / Self Care | Payer: Medicare Other | Source: Ambulatory Visit | Attending: Oncology | Admitting: Oncology

## 2020-02-01 DIAGNOSIS — C211 Malignant neoplasm of anal canal: Secondary | ICD-10-CM | POA: Insufficient documentation

## 2020-02-01 DIAGNOSIS — C4351 Malignant melanoma of anal skin: Secondary | ICD-10-CM | POA: Diagnosis not present

## 2020-02-01 DIAGNOSIS — Z79899 Other long term (current) drug therapy: Secondary | ICD-10-CM | POA: Diagnosis not present

## 2020-02-01 DIAGNOSIS — Z1231 Encounter for screening mammogram for malignant neoplasm of breast: Secondary | ICD-10-CM

## 2020-02-01 DIAGNOSIS — C7989 Secondary malignant neoplasm of other specified sites: Secondary | ICD-10-CM | POA: Diagnosis not present

## 2020-02-01 LAB — GLUCOSE, CAPILLARY: Glucose-Capillary: 89 mg/dL (ref 70–99)

## 2020-02-01 IMAGING — CT NM PET IMAGE RESTAGE (PS) WHOLE BODY
1 of 9 series · 3 of 25 positions shown · non-contrast
Comparison: PET of [DATE].  Breast biopsy [DATE].

CLINICAL DATA: Subsequent treatment strategy for melanoma of the
anal canal. [2C] vaccine in [REDACTED]. Left-sided. Recent right
breast biopsy.

EXAM:
NUCLEAR MEDICINE PET WHOLE BODY
TECHNIQUE: 6.9 mCi F-18 FDG was injected intravenously. Full-ring PET imaging
was performed from the skull base to thigh after the radiotracer. CT
data was obtained and used for attenuation correction and anatomic
localization.
Fasting blood glucose: 89 mg/dl

[Series 4: ct wb 5.0 hd_fov · axial · 5.0mm · 1.27mm/px · z∈[+84,+972]mm · 3 of 444 slices shown]
[im 111/444  soft-tissue]
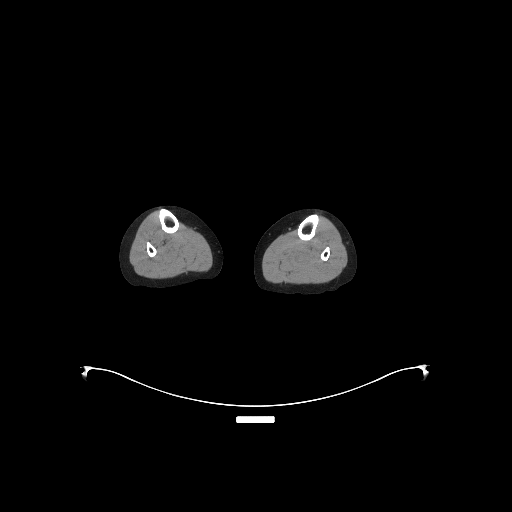
[im 222/444  soft-tissue]
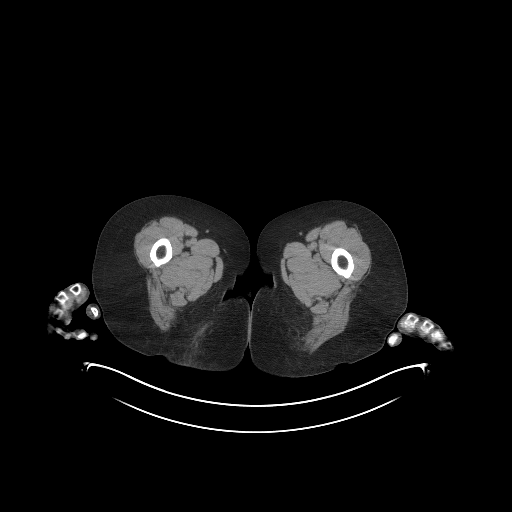
[im 333/444  soft-tissue]
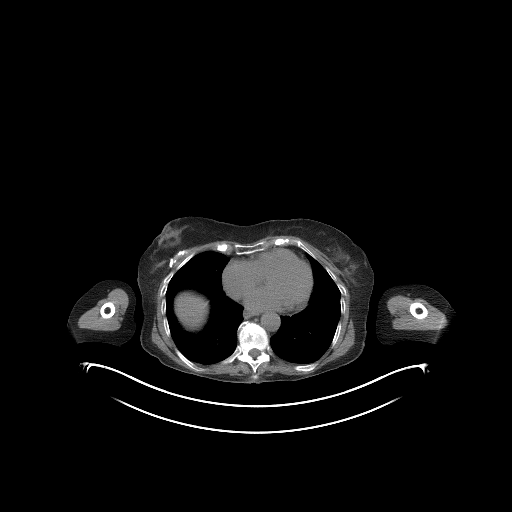

[3 of 25 positions shown; findings below may reference images not displayed]

FINDINGS: Mediastinal blood pool activity: SUV max

HEAD/NECK: No areas of abnormal hypermetabolism.

Incidental CT findings: No cervical adenopathy. Bilateral carotid
atherosclerosis. Fluid level in the left maxillary sinus, new.

CHEST: Right axillary node measures 8 mm and a S.U.V. max of 2.4 on
88/4. Compare similar in size and a S.U.V. max of 1.3 on 90/4.

Right lateral breast hypermetabolism, corresponding to soft tissue
nodularity just lateral to deep breast calcifications. This measures
a S.U.V. max of 2.9 on 108/4 versus a S.U.V. max of 3.6 on the prior
exam

No pulmonary parenchymal hypermetabolism.

Incidental CT findings: Aortic atherosclerosis.

ABDOMEN/PELVIS: The previously described presacral hypermetabolic
nodule is no longer appreciated.

The left inguinal crease (called labial on the prior exam) nodule
measures 6 mm and a S.U.V. max of 5.5 on 214/4. Compare similar in
size and a S.U.V. max of 5.2 on the prior exam.

The anorectal junction hypermetabolism is no longer appreciated.

Incidental CT findings: Colonic stool burden suggests constipation.
Normal adrenal glands. Abdominal aortic atherosclerosis. Pelvic
floor laxity.

SKELETON: No abnormal marrow activity.

Incidental CT findings: none

EXTREMITIES: The hypermetabolism within the posteromedial left thigh
musculature measures a S.U.V. max of 8.5 today versus a S.U.V. max
of 9.7 on the prior exam.

Medial right patellofemoral compartment likely degenerative
hypermetabolism.

Incidental CT findings: none
IMPRESSION: 1. Mild response to therapy. The left inguinal crease and lower
extremity muscular metastasis are similar and slightly less
hypermetabolic than on the prior exam. The presacral and anorectal
junction foci of hypermetabolism are no longer identified.
2. No new or progressive disease.
3. Decrease in lateral right breast hypermetabolism, status post
biopsy. Low-level hypermetabolism within a right axillary node may
be reactive in the setting of recent biopsy.

## 2020-02-01 MED ORDER — FLUDEOXYGLUCOSE F - 18 (FDG) INJECTION
6.9000 | Freq: Once | INTRAVENOUS | Status: AC | PRN
  Administered 2020-02-01: 6.9 via INTRAVENOUS

## 2020-02-04 ENCOUNTER — Inpatient Hospital Stay: Payer: Medicare Other

## 2020-02-04 ENCOUNTER — Other Ambulatory Visit: Payer: Self-pay

## 2020-02-04 ENCOUNTER — Inpatient Hospital Stay (HOSPITAL_BASED_OUTPATIENT_CLINIC_OR_DEPARTMENT_OTHER): Payer: Medicare Other | Admitting: Oncology

## 2020-02-04 VITALS — BP 142/68 | HR 87 | Temp 98.3°F | Resp 18 | Ht 65.0 in | Wt 145.5 lb

## 2020-02-04 DIAGNOSIS — C50919 Malignant neoplasm of unspecified site of unspecified female breast: Secondary | ICD-10-CM

## 2020-02-04 DIAGNOSIS — Z5112 Encounter for antineoplastic immunotherapy: Secondary | ICD-10-CM | POA: Diagnosis not present

## 2020-02-04 DIAGNOSIS — Z853 Personal history of malignant neoplasm of breast: Secondary | ICD-10-CM | POA: Diagnosis not present

## 2020-02-04 DIAGNOSIS — C211 Malignant neoplasm of anal canal: Secondary | ICD-10-CM

## 2020-02-04 DIAGNOSIS — C7982 Secondary malignant neoplasm of genital organs: Secondary | ICD-10-CM | POA: Diagnosis not present

## 2020-02-04 DIAGNOSIS — R928 Other abnormal and inconclusive findings on diagnostic imaging of breast: Secondary | ICD-10-CM | POA: Diagnosis not present

## 2020-02-04 DIAGNOSIS — C7989 Secondary malignant neoplasm of other specified sites: Secondary | ICD-10-CM | POA: Diagnosis not present

## 2020-02-04 DIAGNOSIS — E039 Hypothyroidism, unspecified: Secondary | ICD-10-CM

## 2020-02-04 LAB — CBC WITH DIFFERENTIAL (CANCER CENTER ONLY)
Abs Immature Granulocytes: 0.03 10*3/uL (ref 0.00–0.07)
Basophils Absolute: 0.1 10*3/uL (ref 0.0–0.1)
Basophils Relative: 1 %
Eosinophils Absolute: 0.8 10*3/uL — ABNORMAL HIGH (ref 0.0–0.5)
Eosinophils Relative: 7 %
HCT: 40.6 % (ref 36.0–46.0)
Hemoglobin: 12.9 g/dL (ref 12.0–15.0)
Immature Granulocytes: 0 %
Lymphocytes Relative: 17 %
Lymphs Abs: 1.7 10*3/uL (ref 0.7–4.0)
MCH: 30.4 pg (ref 26.0–34.0)
MCHC: 31.8 g/dL (ref 30.0–36.0)
MCV: 95.8 fL (ref 80.0–100.0)
Monocytes Absolute: 0.7 10*3/uL (ref 0.1–1.0)
Monocytes Relative: 7 %
Neutro Abs: 7.1 10*3/uL (ref 1.7–7.7)
Neutrophils Relative %: 68 %
Platelet Count: 326 10*3/uL (ref 150–400)
RBC: 4.24 MIL/uL (ref 3.87–5.11)
RDW: 13.6 % (ref 11.5–15.5)
WBC Count: 10.4 10*3/uL (ref 4.0–10.5)
nRBC: 0 % (ref 0.0–0.2)

## 2020-02-04 LAB — CMP (CANCER CENTER ONLY)
ALT: 20 U/L (ref 0–44)
AST: 21 U/L (ref 15–41)
Albumin: 3.6 g/dL (ref 3.5–5.0)
Alkaline Phosphatase: 85 U/L (ref 38–126)
Anion gap: 10 (ref 5–15)
BUN: 11 mg/dL (ref 8–23)
CO2: 27 mmol/L (ref 22–32)
Calcium: 9 mg/dL (ref 8.9–10.3)
Chloride: 103 mmol/L (ref 98–111)
Creatinine: 0.69 mg/dL (ref 0.44–1.00)
GFR, Est AFR Am: >60 mL/min (ref >60)
GFR, Estimated: >60 mL/min (ref >60)
Glucose, Bld: 151 mg/dL — ABNORMAL HIGH (ref 70–99)
Potassium: 4 mmol/L (ref 3.5–5.1)
Sodium: 140 mmol/L (ref 135–145)
Total Bilirubin: 0.5 mg/dL (ref 0.3–1.2)
Total Protein: 6.5 g/dL (ref 6.5–8.1)

## 2020-02-04 LAB — TSH: TSH: 1.927 u[IU]/mL (ref 0.308–3.960)

## 2020-02-04 MED ORDER — SODIUM CHLORIDE 0.9 % IV SOLN
200.0000 mg | Freq: Once | INTRAVENOUS | Status: AC
  Administered 2020-02-04: 200 mg via INTRAVENOUS
  Filled 2020-02-04: qty 8

## 2020-02-04 MED ORDER — SODIUM CHLORIDE 0.9 % IV SOLN
Freq: Once | INTRAVENOUS | Status: AC
  Filled 2020-02-04: qty 250

## 2020-02-04 NOTE — Progress Notes (Signed)
Hematology and Oncology Follow Up Visit  Angela Carpenter [id number] Mar 22, 1941 79 y.o. 02/04/2020 1:00 PM Tisovec, Angela Carpenter, MDTisovec, Angela Him, MD   Principle Diagnosis: 79 year old woman with melanoma of the anal canal diagnosed in August 2020.  She was found to have stage IV disease with pelvic involvement and labial lesion.  Prior Therapy:   She is status post perianal excision in August 2020.  Current therapy: Pembrolizumab 200 mg every 3 weeks started on July 29, 2019.  She is here for cycle 10.  Interim History: Angela Carpenter returns today for a repeat evaluation.  Since the last visit, she reports no major complications since the last visit.  She underwent breast biopsy without any issues.  She denies any complication related to Pembrolizumab including nausea, fatigue or weight loss.  Her hair loss has improved at this time.  Full status and quality of life remain excellent.  She denies any change in her bowel habits or excessive fatigue.         Medications: Unchanged on review. Current Outpatient Medications  Medication Sig Dispense Refill  . acyclovir (ZOVIRAX) 400 MG tablet Take 400 mg by mouth 2 (two) times daily.    Marland Kitchen alendronate (FOSAMAX) 70 MG tablet Take 70 mg by mouth once a week. Take with a full glass of water on an empty stomach.    Marland Kitchen atorvastatin (LIPITOR) 10 MG tablet TAKE 1 TABLET DAILY 90 tablet 0  . Cholecalciferol (VITAMIN D-3) 125 MCG (5000 UT) TABS Take 5,000 Units by mouth daily.     Marland Kitchen ELIQUIS 5 MG TABS tablet TAKE 1 TABLET TWICE A DAY 180 tablet 1  . metoprolol succinate (TOPROL-XL) 25 MG 24 hr tablet TAKE 1 TABLET TWICE A DAY 180 tablet 0  . Multiple Vitamins-Minerals (PRESERVISION AREDS 2+MULTI VIT) CAPS Take 1 capsule by mouth 2 (two) times daily.    . prochlorperazine (COMPAZINE) 10 MG tablet Take 1 tablet (10 mg total) by mouth every 6 (six) hours as needed for nausea or vomiting. 30 tablet 0  . raloxifene (EVISTA) 60 MG tablet Take 60 mg by mouth  daily.    . timolol (TIMOPTIC) 0.5 % ophthalmic solution Place 1 drop into both eyes 2 (two) times daily.    . traMADol (ULTRAM) 50 MG tablet Take 1 tablet (50 mg total) by mouth every 6 (six) hours as needed. 15 tablet 0   Current Facility-Administered Medications  Medication Dose Route Frequency Provider Last Rate Last Admin  . 0.9 %  sodium chloride infusion  500 mL Intravenous Continuous Danis, Estill Cotta III, MD         Allergies: No Known Allergies     Physical Exam:    Blood pressure (!) 142/68, pulse 87, temperature 98.3 F (36.8 C), temperature source Temporal, resp. rate 18, height 5\' 5"  (1.651 m), weight 145 lb 8 oz (66 kg), SpO2 98 %.       ECOG: 1    General appearance: Comfortable appearing without any discomfort Head: Normocephalic without any trauma Oropharynx: Mucous membranes are moist and pink without any thrush or ulcers. Eyes: Pupils are equal and round reactive to light. Lymph nodes: No cervical, supraclavicular, inguinal or axillary lymphadenopathy.   Heart:regular rate and rhythm.  S1 and S2 without leg edema. Lung: Clear without any rhonchi or wheezes.  No dullness to percussion. Abdomin: Soft, nontender, nondistended with good bowel sounds.  No hepatosplenomegaly. Musculoskeletal: No joint deformity or effusion.  Full range of motion noted. Neurological: No deficits noted  on motor, sensory and deep tendon reflex exam. Skin: No petechial rash or dryness.  Appeared moist.  .               Lab Results: Lab Results  Component Value Date   WBC 7.5 01/13/2020   HGB 13.8 01/13/2020   HCT 43.0 01/13/2020   MCV 96.6 01/13/2020   PLT 283 01/13/2020     Chemistry      Component Value Date/Time   NA 142 01/13/2020 0911   NA 138 12/05/2016 0750   K 4.3 01/13/2020 0911   CL 105 01/13/2020 0911   CO2 28 01/13/2020 0911   BUN 12 01/13/2020 0911   BUN 15 12/05/2016 0750   CREATININE 0.73 01/13/2020 0911   CREATININE 0.70 09/05/2015  0828      Component Value Date/Time   CALCIUM 8.9 01/13/2020 0911   ALKPHOS 70 01/13/2020 0911   AST 18 01/13/2020 0911   ALT 18 01/13/2020 0911   BILITOT 0.4 01/13/2020 0911       IMPRESSION: 1. Mild response to therapy. The left inguinal crease and lower extremity muscular metastasis are similar and slightly less hypermetabolic than on the prior exam. The presacral and anorectal junction foci of hypermetabolism are no longer identified. 2. No new or progressive disease. 3. Decrease in lateral right breast hypermetabolism, status post biopsy. Low-level hypermetabolism within a right axillary node may be reactive in the setting of recent biopsy.    Impression and Plan:  79 year old with:  1.   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  She was found to have stage IV disease with a pelvic nodule as well as labial/inguinal fold involvement.  She is currently receiving Pembrolizumab without any major complications.  PET CT scan obtained on February 01, 2020 was personally reviewed and discussed with the patient.  She continues to have positive response to therapy with resolution of her left inguinal/labial lesion.  Her presacral and anorectal junction foci have also resolved.  Risks and benefits of continuing this therapy long-term was discussed.  Given her continued benefit at this time I have recommended the same dose and schedule at this time.  The duration of therapy would be depending on her response and any potential complications.   After discussion today she is agreeable to continue with the treatment.  2.  Immune mediated complications: Complications such as thyroid disease, pneumonitis, colitis among others were reviewed.  Is not experiencing any at this time.  3. Dermatology surveillance: Continues to follow with dermatology without any recent cutaneous melanoma.  4.  Antiemetics: No nausea or vomiting reported at this time.  Compazine is  available to her.  5.  IV access: No issues reported with her peripheral.  Port-A-Cath has been deferred for the time being..  6.  Breast cancer: He status post definitive therapy in the past with abnormal PET scan around the right breast.  Biopsy obtained on January 21, 2020 showed fat necrosis and calcification without any evidence of disease relapse.  She will continue with diagnostic mammography periodically.  7.  Follow-up: In 3 weeks for the next cycle of therapy.  30 minutes were dedicated to this visit.  Time was spent on reviewing imaging studies, and laboratory data and addressing complications to therapy and future plan of care.      Zola Button, MD 4/30/20211:00 PM

## 2020-02-04 NOTE — Patient Instructions (Signed)
[facility] Discharge Instructions for Patients Receiving Chemotherapy  Today you received the following chemotherapy agents: pembrolizumab.  To help prevent nausea and vomiting after your treatment, we encourage you to take your nausea medication as directed.   If you develop nausea and vomiting that is not controlled by your nausea medication, call the clinic.   BELOW ARE SYMPTOMS THAT SHOULD BE REPORTED IMMEDIATELY:  *FEVER GREATER THAN 100.5 F  *CHILLS WITH OR WITHOUT FEVER  NAUSEA AND VOMITING THAT IS NOT CONTROLLED WITH YOUR NAUSEA MEDICATION  *UNUSUAL SHORTNESS OF BREATH  *UNUSUAL BRUISING OR BLEEDING  TENDERNESS IN MOUTH AND THROAT WITH OR WITHOUT PRESENCE OF ULCERS  *URINARY PROBLEMS  *BOWEL PROBLEMS  UNUSUAL RASH Items with * indicate a potential emergency and should be followed up as soon as possible.  Feel free to call the clinic should you have any questions or concerns. The clinic phone number is [phone].  Please show the CHEMO ALERT CARD at check-in to the Emergency Department and triage nurse.   

## 2020-02-22 ENCOUNTER — Other Ambulatory Visit: Payer: Self-pay | Admitting: Cardiology

## 2020-02-22 DIAGNOSIS — Z9229 Personal history of other drug therapy: Secondary | ICD-10-CM

## 2020-02-22 DIAGNOSIS — I480 Paroxysmal atrial fibrillation: Secondary | ICD-10-CM

## 2020-02-22 DIAGNOSIS — I119 Hypertensive heart disease without heart failure: Secondary | ICD-10-CM

## 2020-02-22 DIAGNOSIS — E7849 Other hyperlipidemia: Secondary | ICD-10-CM

## 2020-02-24 ENCOUNTER — Inpatient Hospital Stay: Payer: Medicare Other | Attending: Oncology

## 2020-02-24 ENCOUNTER — Telehealth: Payer: Self-pay | Admitting: Oncology

## 2020-02-24 ENCOUNTER — Inpatient Hospital Stay: Payer: Medicare Other

## 2020-02-24 ENCOUNTER — Inpatient Hospital Stay (HOSPITAL_BASED_OUTPATIENT_CLINIC_OR_DEPARTMENT_OTHER): Payer: Medicare Other | Admitting: Oncology

## 2020-02-24 ENCOUNTER — Other Ambulatory Visit: Payer: Self-pay

## 2020-02-24 VITALS — BP 136/75 | HR 69 | Temp 98.4°F | Resp 18 | Ht 65.0 in | Wt 146.3 lb

## 2020-02-24 DIAGNOSIS — C211 Malignant neoplasm of anal canal: Secondary | ICD-10-CM

## 2020-02-24 DIAGNOSIS — C50919 Malignant neoplasm of unspecified site of unspecified female breast: Secondary | ICD-10-CM

## 2020-02-24 DIAGNOSIS — Z853 Personal history of malignant neoplasm of breast: Secondary | ICD-10-CM | POA: Diagnosis not present

## 2020-02-24 DIAGNOSIS — Z5112 Encounter for antineoplastic immunotherapy: Secondary | ICD-10-CM | POA: Diagnosis not present

## 2020-02-24 LAB — CMP (CANCER CENTER ONLY)
ALT: 30 U/L (ref 0–44)
AST: 26 U/L (ref 15–41)
Albumin: 3.5 g/dL (ref 3.5–5.0)
Alkaline Phosphatase: 119 U/L (ref 38–126)
Anion gap: 7 (ref 5–15)
BUN: 13 mg/dL (ref 8–23)
CO2: 28 mmol/L (ref 22–32)
Calcium: 9 mg/dL (ref 8.9–10.3)
Chloride: 104 mmol/L (ref 98–111)
Creatinine: 0.68 mg/dL (ref 0.44–1.00)
GFR, Est AFR Am: >60 mL/min (ref >60)
GFR, Estimated: >60 mL/min (ref >60)
Glucose, Bld: 85 mg/dL (ref 70–99)
Potassium: 4.6 mmol/L (ref 3.5–5.1)
Sodium: 139 mmol/L (ref 135–145)
Total Bilirubin: 0.3 mg/dL (ref 0.3–1.2)
Total Protein: 6.6 g/dL (ref 6.5–8.1)

## 2020-02-24 LAB — CBC WITH DIFFERENTIAL (CANCER CENTER ONLY)
Abs Immature Granulocytes: 0.06 10*3/uL (ref 0.00–0.07)
Basophils Absolute: 0.2 10*3/uL — ABNORMAL HIGH (ref 0.0–0.1)
Basophils Relative: 2 %
Eosinophils Absolute: 1.9 10*3/uL — ABNORMAL HIGH (ref 0.0–0.5)
Eosinophils Relative: 17 %
HCT: 41.5 % (ref 36.0–46.0)
Hemoglobin: 13.3 g/dL (ref 12.0–15.0)
Immature Granulocytes: 1 %
Lymphocytes Relative: 13 %
Lymphs Abs: 1.4 10*3/uL (ref 0.7–4.0)
MCH: 30.5 pg (ref 26.0–34.0)
MCHC: 32 g/dL (ref 30.0–36.0)
MCV: 95.2 fL (ref 80.0–100.0)
Monocytes Absolute: 1.1 10*3/uL — ABNORMAL HIGH (ref 0.1–1.0)
Monocytes Relative: 10 %
Neutro Abs: 6.2 10*3/uL (ref 1.7–7.7)
Neutrophils Relative %: 57 %
Platelet Count: 324 10*3/uL (ref 150–400)
RBC: 4.36 MIL/uL (ref 3.87–5.11)
RDW: 13.4 % (ref 11.5–15.5)
WBC Count: 10.7 10*3/uL — ABNORMAL HIGH (ref 4.0–10.5)
nRBC: 0 % (ref 0.0–0.2)

## 2020-02-24 MED ORDER — SODIUM CHLORIDE 0.9 % IV SOLN
200.0000 mg | Freq: Once | INTRAVENOUS | Status: AC
  Administered 2020-02-24: 200 mg via INTRAVENOUS
  Filled 2020-02-24: qty 8

## 2020-02-24 MED ORDER — SODIUM CHLORIDE 0.9 % IV SOLN
Freq: Once | INTRAVENOUS | Status: AC
  Filled 2020-02-24: qty 250

## 2020-02-24 NOTE — Progress Notes (Signed)
Hematology and Oncology Follow Up Visit  Angela Carpenter [id number] 05-Apr-1941 79 y.o. 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    Prior Therapy:   She is status post perianal excision in August 2020.  Current therapy: Pembrolizumab 200 mg every 3 weeks started on July 29, 2019.  She is here for cycle 11.  Interim History: Ms. Mouton is here for a return evaluation.  Since her last visit, she reports no major changes in her health.  She continues to tolerate Pembrolizumab without any new side effects.  She denies any nausea, abdominal pain or diarrhea.  She denies respiratory complaints or skin rash.  She denies any more hair loss at this time.         Medications: Updated on review. Current Outpatient Medications  Medication Sig Dispense Refill  . acyclovir (ZOVIRAX) 400 MG tablet Take 400 mg by mouth 2 (two) times daily.    Marland Kitchen alendronate (FOSAMAX) 70 MG tablet Take 70 mg by mouth once a week. Take with a full glass of water on an empty stomach.    Marland Kitchen atorvastatin (LIPITOR) 10 MG tablet Take 1 tablet (10 mg total) by mouth daily. 90 tablet 3  . Cholecalciferol (VITAMIN D-3) 125 MCG (5000 UT) TABS Take 5,000 Units by mouth daily.     Marland Kitchen ELIQUIS 5 MG TABS tablet TAKE 1 TABLET TWICE A DAY 180 tablet 1  . metoprolol succinate (TOPROL-XL) 25 MG 24 hr tablet Take 1 tablet (25 mg total) by mouth 2 (two) times daily. 180 tablet 3  . Multiple Vitamins-Minerals (PRESERVISION AREDS 2+MULTI VIT) CAPS Take 1 capsule by mouth 2 (two) times daily.    . prochlorperazine (COMPAZINE) 10 MG tablet Take 1 tablet (10 mg total) by mouth every 6 (six) hours as needed for nausea or vomiting. 30 tablet 0  . raloxifene (EVISTA) 60 MG tablet Take 60 mg by mouth daily.    . timolol (TIMOPTIC) 0.5 % ophthalmic solution  Place 1 drop into both eyes 2 (two) times daily.    . traMADol (ULTRAM) 50 MG tablet Take 1 tablet (50 mg total) by mouth every 6 (six) hours as needed. 15 tablet 0   Current Facility-Administered Medications  Medication Dose Route Frequency Provider Last Rate Last Admin  . 0.9 %  sodium chloride infusion  500 mL Intravenous Continuous Danis, Estill Cotta III, MD         Allergies: No Known Allergies     Physical Exam:    Blood pressure 136/75, pulse 69, temperature 98.4 F (36.9 C), temperature source Temporal, resp. rate 18, height 5\' 5"  (1.651 m), weight 146 lb 4.8 oz (66.4 kg), SpO2 100 %.        ECOG: 1    General appearance: Alert, awake without any distress. Head: Atraumatic without abnormalities Oropharynx: Without any thrush or ulcers. Eyes: No scleral icterus. Lymph nodes: No lymphadenopathy noted in the cervical, supraclavicular, or axillary nodes Heart:regular rate and rhythm, without any murmurs or gallops.   Lung: Clear to auscultation without any rhonchi, wheezes or dullness to percussion. Abdomin: Soft, nontender without any shifting dullness or ascites. Musculoskeletal: No clubbing or cyanosis. Neurological: No motor or sensory deficits. Skin: No rashes or lesions.                Lab Results: Lab Results  Component Value Date   WBC 10.4 02/04/2020   HGB 12.9 02/04/2020   HCT 40.6 02/04/2020   MCV 95.8 02/04/2020   PLT 326 02/04/2020     Chemistry      Component Value Date/Time   NA 140 02/04/2020 1253   NA 138 12/05/2016 0750   K 4.0 02/04/2020 1253   CL 103 02/04/2020 1253   CO2 27 02/04/2020 1253   BUN 11 02/04/2020 1253   BUN 15 12/05/2016 0750   CREATININE 0.69 02/04/2020 1253   CREATININE 0.70 09/05/2015 0828      Component Value Date/Time   CALCIUM 9.0 02/04/2020 1253   ALKPHOS 85 02/04/2020 1253   AST 21 02/04/2020 1253   ALT 20 02/04/2020 1253   BILITOT 0.5 02/04/2020 1253          Impression and  Plan:  79 year old with:  1.   Stage IV melanoma of the anal canal with pelvic nodule and labial involvement diagnosed in August 2020.  ent.  She continues to tolerate Pembrolizumab without any major complications.  She is experiencing radiographic improvement in her limited disease at this time.  Risks and benefits of continuing therapy were reviewed.  The duration of therapy is anticipated till she continues to complete response close to a year.  Alternative options would be to discontinue therapy now and continued active surveillance.     2.  Immune mediated complications: I continue to educate her about potential immune complications such as thyroid disease, pneumonitis, colitis as well as thyroid disease.   3. Dermatology surveillance: He is up-to-date in her dermatology surveillance which I encouraged her to continue to do so..  4.  Antiemetics: Compazine is available to her without any recent nausea or vomiting.   5.  IV access: Peripheral veins are currently in use at this time.  6.  Breast cancer: Remote history without any evidence of recurrence based on PET scan and biopsy.  7.  Follow-up: She will return in 3 weeks for the next cycle of therapy.  30 minutes were spent on this encounter.  The time was dedicated to updating her disease status, discussing treatment options and addressing complications noted therapy.      Zola Button, MD 5/20/202110:00 AM

## 2020-02-24 NOTE — Patient Instructions (Signed)
[facility] Discharge Instructions for Patients Receiving Chemotherapy  Today you received the following chemotherapy agents: pembrolizumab.  To help prevent nausea and vomiting after your treatment, we encourage you to take your nausea medication as directed.   If you develop nausea and vomiting that is not controlled by your nausea medication, call the clinic.   BELOW ARE SYMPTOMS THAT SHOULD BE REPORTED IMMEDIATELY:  *FEVER GREATER THAN 100.5 F  *CHILLS WITH OR WITHOUT FEVER  NAUSEA AND VOMITING THAT IS NOT CONTROLLED WITH YOUR NAUSEA MEDICATION  *UNUSUAL SHORTNESS OF BREATH  *UNUSUAL BRUISING OR BLEEDING  TENDERNESS IN MOUTH AND THROAT WITH OR WITHOUT PRESENCE OF ULCERS  *URINARY PROBLEMS  *BOWEL PROBLEMS  UNUSUAL RASH Items with * indicate a potential emergency and should be followed up as soon as possible.  Feel free to call the clinic should you have any questions or concerns. The clinic phone number is [phone].  Please show the CHEMO ALERT CARD at check-in to the Emergency Department and triage nurse.   

## 2020-02-24 NOTE — Telephone Encounter (Signed)
Scheduled appt per 5/20 los.  

## 2020-03-01 DIAGNOSIS — M810 Age-related osteoporosis without current pathological fracture: Secondary | ICD-10-CM | POA: Diagnosis not present

## 2020-03-01 DIAGNOSIS — E7800 Pure hypercholesterolemia, unspecified: Secondary | ICD-10-CM | POA: Diagnosis not present

## 2020-03-07 ENCOUNTER — Ambulatory Visit
Admission: RE | Admit: 2020-03-07 | Discharge: 2020-03-07 | Disposition: A | Discharge status: Home / Self Care | Payer: Medicare Other | Source: Ambulatory Visit | Attending: Internal Medicine | Admitting: Internal Medicine

## 2020-03-07 ENCOUNTER — Other Ambulatory Visit: Payer: Self-pay

## 2020-03-07 DIAGNOSIS — Z1231 Encounter for screening mammogram for malignant neoplasm of breast: Secondary | ICD-10-CM

## 2020-03-07 IMAGING — MG DIGITAL SCREENING BILAT W/ TOMO W/ CAD
6 of 10 series · 6 of 30 positions shown · non-contrast
Comparison: Previous exam(s).

CLINICAL DATA: Screening.

EXAM:
DIGITAL SCREENING BILATERAL MAMMOGRAM WITH TOMO AND CAD

[R CC synth-2D]
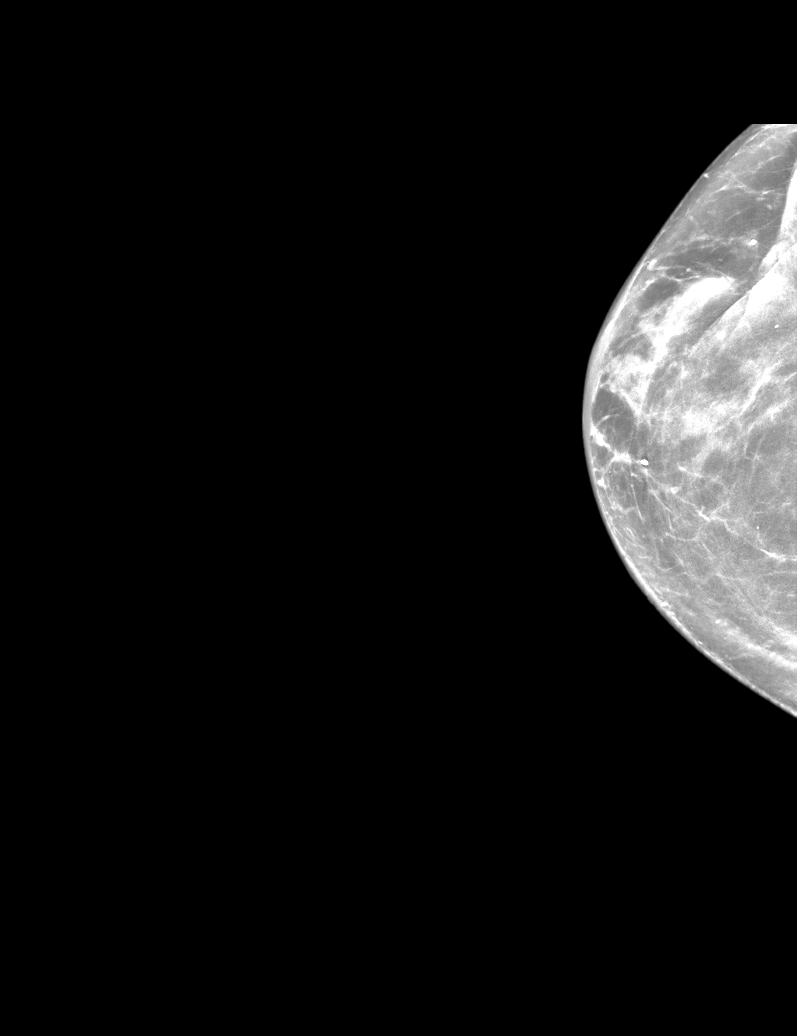

[R XCCL synth-2D]
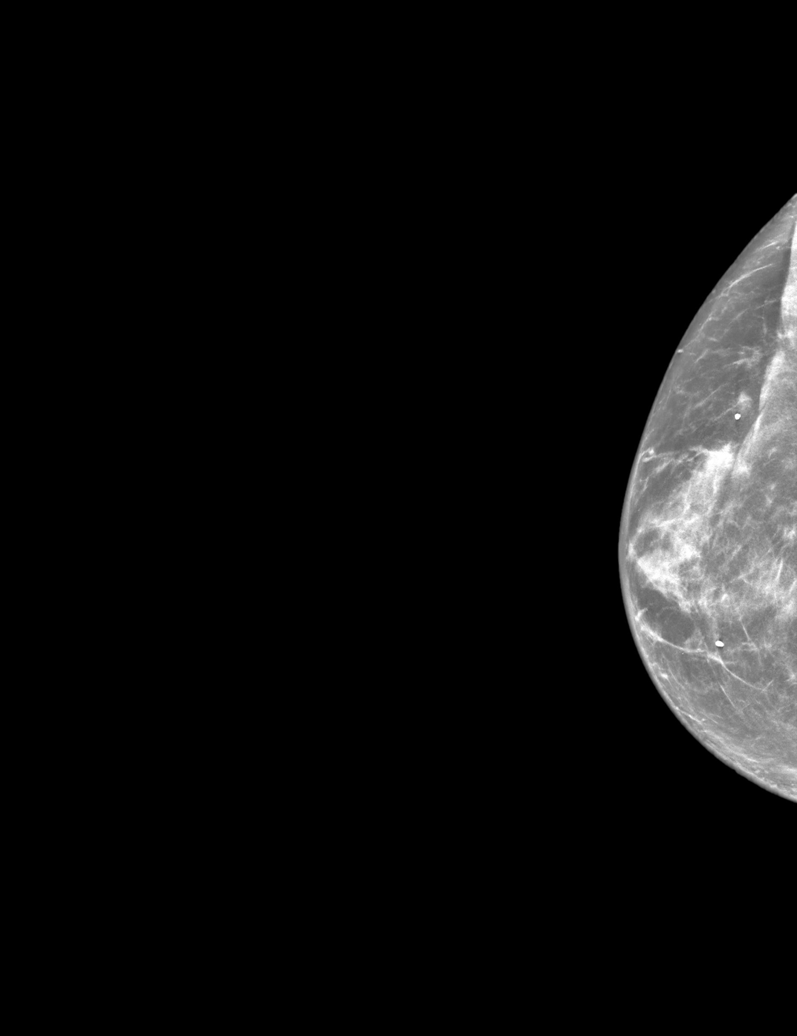

[L CC synth-2D]
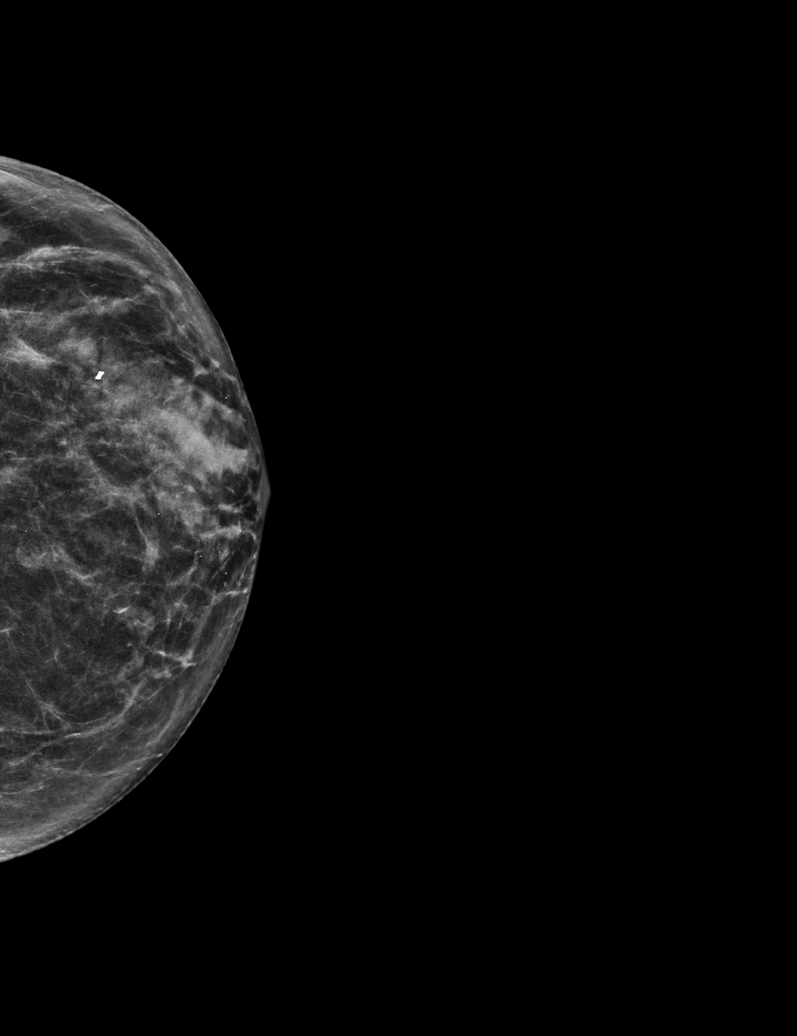

[L MLO synth-2D]
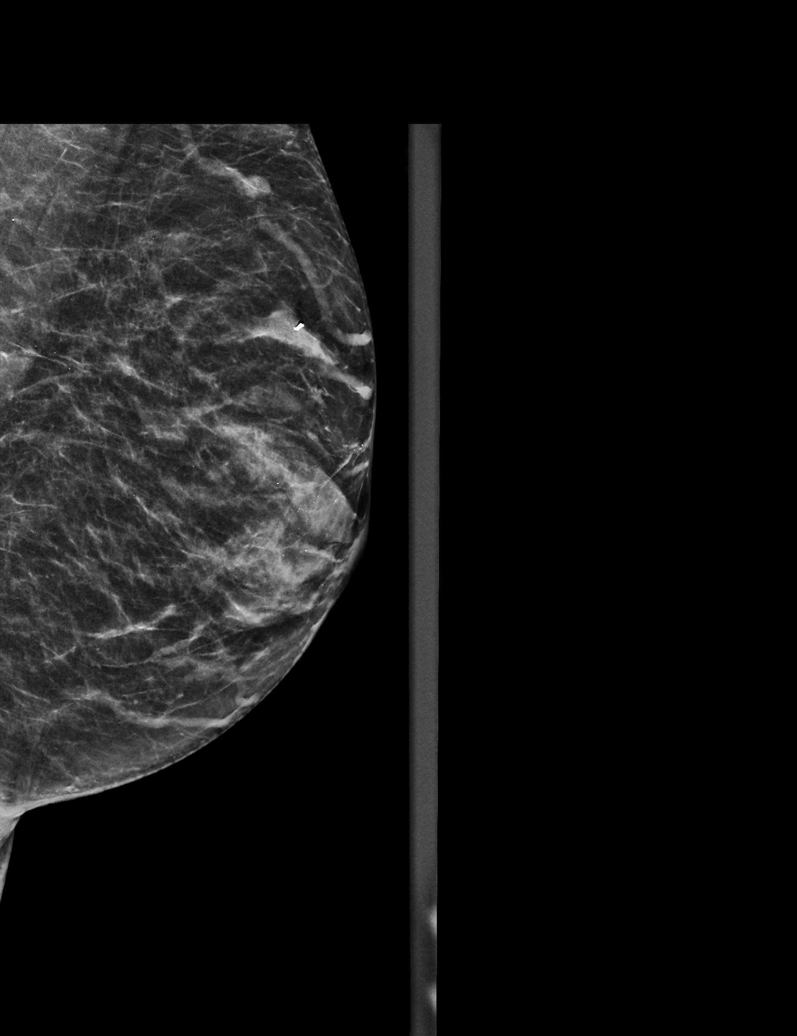

[R MLO synth-2D]
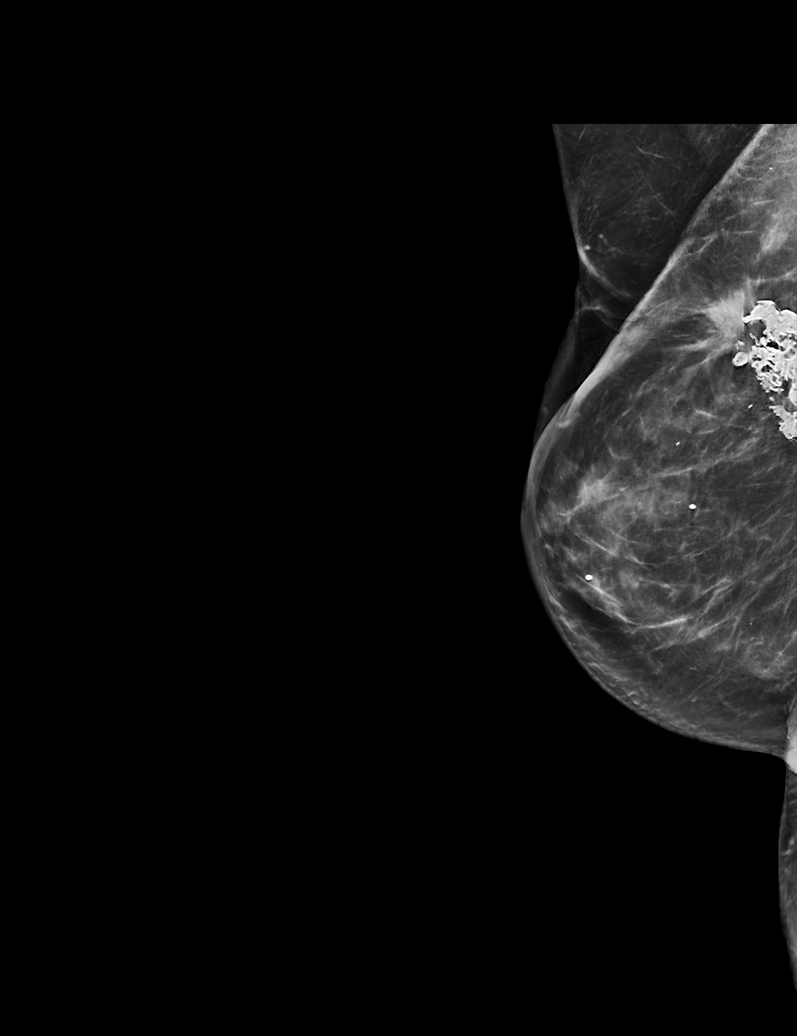

[L MLO tomo · tomo slice 27/52.0]
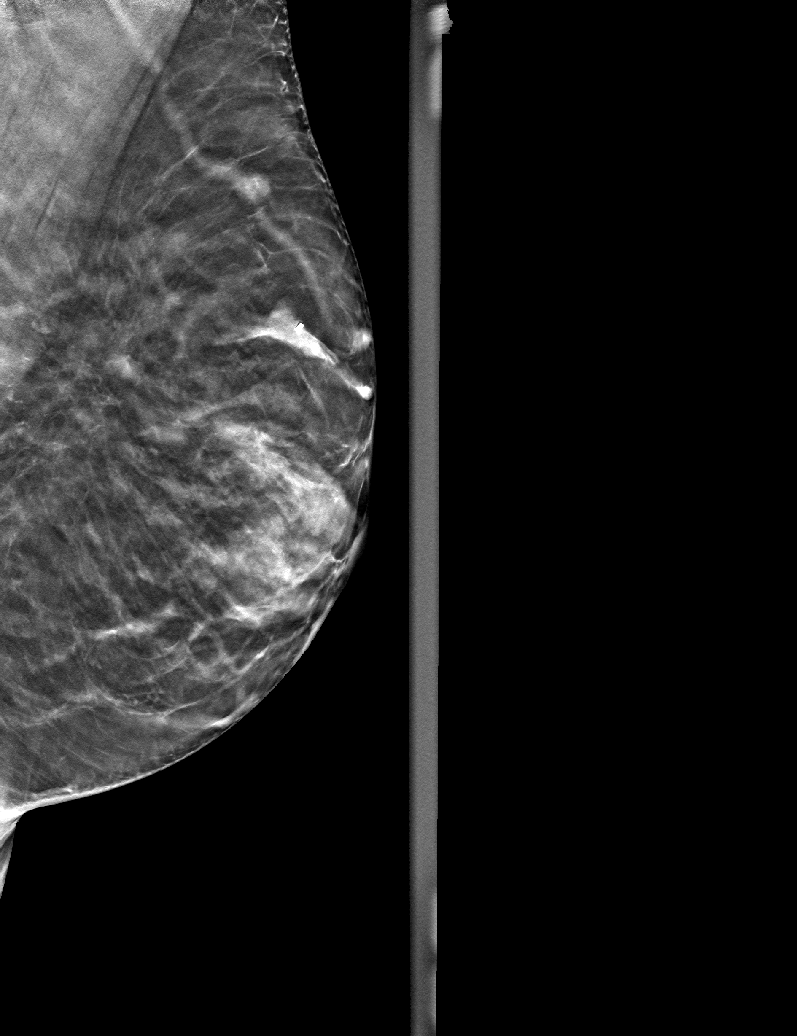

[6 of 30 positions shown; findings below may reference images not displayed]

ACR Breast Density Category c: The breast tissue is heterogeneously
dense, which may obscure small masses.
FINDINGS: There are no findings suspicious for malignancy. Images were
processed with CAD.
IMPRESSION: No mammographic evidence of malignancy. A result letter of this
screening mammogram will be mailed directly to the patient.

RECOMMENDATION:
Screening mammogram in one year. (Code:[5V])

BI-RADS CATEGORY  1: Negative.

## 2020-03-08 DIAGNOSIS — C211 Malignant neoplasm of anal canal: Secondary | ICD-10-CM | POA: Diagnosis not present

## 2020-03-08 DIAGNOSIS — Z0000 Encounter for general adult medical examination without abnormal findings: Secondary | ICD-10-CM | POA: Diagnosis not present

## 2020-03-08 DIAGNOSIS — D6859 Other primary thrombophilia: Secondary | ICD-10-CM | POA: Insufficient documentation

## 2020-03-08 DIAGNOSIS — M810 Age-related osteoporosis without current pathological fracture: Secondary | ICD-10-CM | POA: Diagnosis not present

## 2020-03-08 DIAGNOSIS — I10 Essential (primary) hypertension: Secondary | ICD-10-CM | POA: Diagnosis not present

## 2020-03-08 DIAGNOSIS — H409 Unspecified glaucoma: Secondary | ICD-10-CM | POA: Diagnosis not present

## 2020-03-08 DIAGNOSIS — R82998 Other abnormal findings in urine: Secondary | ICD-10-CM | POA: Diagnosis not present

## 2020-03-08 DIAGNOSIS — Z1331 Encounter for screening for depression: Secondary | ICD-10-CM | POA: Diagnosis not present

## 2020-03-08 DIAGNOSIS — D126 Benign neoplasm of colon, unspecified: Secondary | ICD-10-CM | POA: Diagnosis not present

## 2020-03-08 DIAGNOSIS — I679 Cerebrovascular disease, unspecified: Secondary | ICD-10-CM | POA: Diagnosis not present

## 2020-03-08 DIAGNOSIS — Z7901 Long term (current) use of anticoagulants: Secondary | ICD-10-CM | POA: Diagnosis not present

## 2020-03-08 DIAGNOSIS — I480 Paroxysmal atrial fibrillation: Secondary | ICD-10-CM | POA: Diagnosis not present

## 2020-03-08 DIAGNOSIS — Z853 Personal history of malignant neoplasm of breast: Secondary | ICD-10-CM | POA: Diagnosis not present

## 2020-03-08 DIAGNOSIS — E7800 Pure hypercholesterolemia, unspecified: Secondary | ICD-10-CM | POA: Diagnosis not present

## 2020-03-16 ENCOUNTER — Other Ambulatory Visit: Payer: Self-pay | Admitting: Cardiology

## 2020-03-16 DIAGNOSIS — I119 Hypertensive heart disease without heart failure: Secondary | ICD-10-CM

## 2020-03-16 DIAGNOSIS — E7849 Other hyperlipidemia: Secondary | ICD-10-CM

## 2020-03-16 NOTE — Telephone Encounter (Signed)
Prescription refill request for Eliquis received. Indication: A Fib Last office visit: 01/26/19 Scr: 0.7 on 03/01/20  Age: 79 Weight: 146

## 2020-03-17 ENCOUNTER — Telehealth: Payer: Self-pay | Admitting: Oncology

## 2020-03-17 ENCOUNTER — Inpatient Hospital Stay (HOSPITAL_BASED_OUTPATIENT_CLINIC_OR_DEPARTMENT_OTHER): Payer: Medicare Other | Admitting: Oncology

## 2020-03-17 ENCOUNTER — Inpatient Hospital Stay: Payer: Medicare Other | Attending: Oncology

## 2020-03-17 ENCOUNTER — Other Ambulatory Visit: Payer: Self-pay

## 2020-03-17 ENCOUNTER — Inpatient Hospital Stay: Payer: Medicare Other

## 2020-03-17 VITALS — BP 147/72 | HR 71 | Temp 97.8°F | Resp 18 | Ht 65.0 in | Wt 145.5 lb

## 2020-03-17 DIAGNOSIS — Z79899 Other long term (current) drug therapy: Secondary | ICD-10-CM | POA: Diagnosis not present

## 2020-03-17 DIAGNOSIS — D225 Melanocytic nevi of trunk: Secondary | ICD-10-CM | POA: Diagnosis not present

## 2020-03-17 DIAGNOSIS — E039 Hypothyroidism, unspecified: Secondary | ICD-10-CM

## 2020-03-17 DIAGNOSIS — C211 Malignant neoplasm of anal canal: Secondary | ICD-10-CM | POA: Insufficient documentation

## 2020-03-17 DIAGNOSIS — Z853 Personal history of malignant neoplasm of breast: Secondary | ICD-10-CM | POA: Insufficient documentation

## 2020-03-17 DIAGNOSIS — D485 Neoplasm of uncertain behavior of skin: Secondary | ICD-10-CM | POA: Diagnosis not present

## 2020-03-17 DIAGNOSIS — Z85820 Personal history of malignant melanoma of skin: Secondary | ICD-10-CM | POA: Diagnosis not present

## 2020-03-17 DIAGNOSIS — D1801 Hemangioma of skin and subcutaneous tissue: Secondary | ICD-10-CM | POA: Diagnosis not present

## 2020-03-17 DIAGNOSIS — Z5112 Encounter for antineoplastic immunotherapy: Secondary | ICD-10-CM | POA: Diagnosis not present

## 2020-03-17 DIAGNOSIS — Z85828 Personal history of other malignant neoplasm of skin: Secondary | ICD-10-CM | POA: Diagnosis not present

## 2020-03-17 DIAGNOSIS — C50919 Malignant neoplasm of unspecified site of unspecified female breast: Secondary | ICD-10-CM

## 2020-03-17 DIAGNOSIS — L821 Other seborrheic keratosis: Secondary | ICD-10-CM | POA: Diagnosis not present

## 2020-03-17 DIAGNOSIS — L814 Other melanin hyperpigmentation: Secondary | ICD-10-CM | POA: Diagnosis not present

## 2020-03-17 LAB — CBC WITH DIFFERENTIAL (CANCER CENTER ONLY)
Abs Immature Granulocytes: 0.04 10*3/uL (ref 0.00–0.07)
Basophils Absolute: 0.1 10*3/uL (ref 0.0–0.1)
Basophils Relative: 1 %
Eosinophils Absolute: 0.8 10*3/uL — ABNORMAL HIGH (ref 0.0–0.5)
Eosinophils Relative: 9 %
HCT: 42.5 % (ref 36.0–46.0)
Hemoglobin: 13.6 g/dL (ref 12.0–15.0)
Immature Granulocytes: 0 %
Lymphocytes Relative: 18 %
Lymphs Abs: 1.7 10*3/uL (ref 0.7–4.0)
MCH: 30.9 pg (ref 26.0–34.0)
MCHC: 32 g/dL (ref 30.0–36.0)
MCV: 96.6 fL (ref 80.0–100.0)
Monocytes Absolute: 0.7 10*3/uL (ref 0.1–1.0)
Monocytes Relative: 7 %
Neutro Abs: 6.1 10*3/uL (ref 1.7–7.7)
Neutrophils Relative %: 65 %
Platelet Count: 273 10*3/uL (ref 150–400)
RBC: 4.4 MIL/uL (ref 3.87–5.11)
RDW: 13.3 % (ref 11.5–15.5)
WBC Count: 9.5 10*3/uL (ref 4.0–10.5)
nRBC: 0 % (ref 0.0–0.2)

## 2020-03-17 LAB — CMP (CANCER CENTER ONLY)
ALT: 20 U/L (ref 0–44)
AST: 20 U/L (ref 15–41)
Albumin: 3.9 g/dL (ref 3.5–5.0)
Alkaline Phosphatase: 92 U/L (ref 38–126)
Anion gap: 9 (ref 5–15)
BUN: 12 mg/dL (ref 8–23)
CO2: 28 mmol/L (ref 22–32)
Calcium: 9.3 mg/dL (ref 8.9–10.3)
Chloride: 104 mmol/L (ref 98–111)
Creatinine: 0.71 mg/dL (ref 0.44–1.00)
GFR, Est AFR Am: >60 mL/min (ref >60)
GFR, Estimated: >60 mL/min (ref >60)
Glucose, Bld: 102 mg/dL — ABNORMAL HIGH (ref 70–99)
Potassium: 4.4 mmol/L (ref 3.5–5.1)
Sodium: 141 mmol/L (ref 135–145)
Total Bilirubin: 0.4 mg/dL (ref 0.3–1.2)
Total Protein: 6.8 g/dL (ref 6.5–8.1)

## 2020-03-17 LAB — TSH: TSH: 1.915 u[IU]/mL (ref 0.308–3.960)

## 2020-03-17 MED ORDER — SODIUM CHLORIDE 0.9 % IV SOLN
200.0000 mg | Freq: Once | INTRAVENOUS | Status: AC
  Administered 2020-03-17: 200 mg via INTRAVENOUS
  Filled 2020-03-17: qty 8

## 2020-03-17 MED ORDER — SODIUM CHLORIDE 0.9 % IV SOLN
Freq: Once | INTRAVENOUS | Status: AC
  Filled 2020-03-17: qty 250

## 2020-03-17 NOTE — Telephone Encounter (Signed)
Scheduled appt per 6/11 los. °

## 2020-03-17 NOTE — Progress Notes (Signed)
Hematology and Oncology Follow Up Visit  Angela Carpenter [id number] 04/14/1941 79 y.o. 03/17/2020 12:42 PM Tisovec, [name], MDTisovec, [name], MD   Principle Diagnosis: 79 year old woman with melanoma of the anal canal diagnosed in August 2020.  She presented with stage IV mucosal lesion as well as pelvic nodule. Prior Therapy:   She is status post perianal excision in August 2020.  Current therapy: Pembrolizumab 200 mg every 3 weeks started on July 29, 2019.  She is here for cycle 13.  Interim History: Angela Carpenter is here for a follow-up visit.  Since the last visit, she reports no major changes in her health.  She denies any complications related to her treatment including no nausea, vomiting or abdominal pain.  She denies any diarrhea changes in her bowels.  She denies any respiratory complaints including shortness of breath, cough or wheezing.  Appetite about the same without any changes.  She was seen by dermatology today and a biopsy was taken from an scalp lesion.         Medications: Reviewed without changes. Current Outpatient Medications  Medication Sig Dispense Refill  . acyclovir (ZOVIRAX) 400 MG tablet Take 400 mg by mouth 2 (two) times daily.    Marland Kitchen alendronate (FOSAMAX) 70 MG tablet Take 70 mg by mouth once a week. Take with a full glass of water on an empty stomach.    Marland Kitchen atorvastatin (LIPITOR) 10 MG tablet Take 1 tablet (10 mg total) by mouth daily. 90 tablet 3  . Cholecalciferol (VITAMIN D-3) 125 MCG (5000 UT) TABS Take 5,000 Units by mouth daily.     Marland Kitchen ELIQUIS 5 MG TABS tablet TAKE 1 TABLET TWICE A DAY 180 tablet 1  . metoprolol succinate (TOPROL-XL) 25 MG 24 hr tablet Take 1 tablet (25 mg total) by mouth 2 (two) times daily. 180 tablet 3  . Multiple Vitamins-Minerals (PRESERVISION AREDS 2+MULTI VIT) CAPS Take 1 capsule by mouth 2 (two) times daily.    . prochlorperazine (COMPAZINE) 10 MG tablet Take 1 tablet (10 mg total) by mouth every 6 (six) hours as needed  for nausea or vomiting. 30 tablet 0  . raloxifene (EVISTA) 60 MG tablet Take 60 mg by mouth daily.    . timolol (TIMOPTIC) 0.5 % ophthalmic solution Place 1 drop into both eyes 2 (two) times daily.    . traMADol (ULTRAM) 50 MG tablet Take 1 tablet (50 mg total) by mouth every 6 (six) hours as needed. 15 tablet 0   Current Facility-Administered Medications  Medication Dose Route Frequency Provider Last Rate Last Admin  . 0.9 %  sodium chloride infusion  500 mL Intravenous Continuous Danis, Estill Cotta III, MD         Allergies: No Known Allergies     Physical Exam:        Blood pressure (!) 147/72, pulse 71, temperature 97.8 F (36.6 C), temperature source Temporal, resp. rate 18, height 5\' 5"  (1.651 m), weight 145 lb 8 oz (66 kg), SpO2 99 %.     ECOG: 1   General appearance: Comfortable appearing without any discomfort Head: Normocephalic without any trauma Oropharynx: Mucous membranes are moist and pink without any thrush or ulcers. Eyes: Pupils are equal and round reactive to light. Lymph nodes: No cervical, supraclavicular, inguinal or axillary lymphadenopathy.   Heart:regular rate and rhythm.  S1 and S2 without leg edema. Lung: Clear without any rhonchi or wheezes.  No dullness to percussion. Abdomin: Soft, nontender, nondistended with good bowel sounds.  No hepatosplenomegaly. Musculoskeletal: No joint deformity or effusion.  Full range of motion noted. Neurological: No deficits noted on motor, sensory and deep tendon reflex exam. Skin: No petechial rash or dryness.  Appeared moist.                 Lab Results: Lab Results  Component Value Date   WBC 9.5 03/17/2020   HGB 13.6 03/17/2020   HCT 42.5 03/17/2020   MCV 96.6 03/17/2020   PLT 273 03/17/2020     Chemistry      Component Value Date/Time   NA 139 02/24/2020 0945   NA 138 12/05/2016 0750   K 4.6 02/24/2020 0945   CL 104 02/24/2020 0945   CO2 28 02/24/2020 0945   BUN 13 02/24/2020 0945    BUN 15 12/05/2016 0750   CREATININE 0.68 02/24/2020 0945   CREATININE 0.70 09/05/2015 0828      Component Value Date/Time   CALCIUM 9.0 02/24/2020 0945   ALKPHOS 119 02/24/2020 0945   AST 26 02/24/2020 0945   ALT 30 02/24/2020 0945   BILITOT 0.3 02/24/2020 0945          Impression and Plan:  79 year old with:  1.   Mucosal melanoma of the anal canal with pelvic nodule and labial involvement indicating stage IV disease diagnosed in August 2020.    She has tolerated simulated Pembrolizumab without any major complications.  Imaging studies obtained in April 2021 showed continuous improvement although still has some residual disease.  Risks and benefits of continuing this treatment long-term were reviewed.  Potential treatment for a year or beyond is considered a possibility given her overall tolerance as well as gradual response.  Plan is to repeat imaging studies tentatively in July 2021.  She is agreeable to continue at this time.     2.  Immune mediated complications: She is not experiencing any complications at this time.  I continue to educate her about these issues including pneumonitis, colitis and thyroid disease.  3. Dermatology surveillance: I recommended dermatology follow-up on a regular basis at this time.  She is up-to-date on her follow-up.  4.  Antiemetics: No nausea or vomiting reported.  Antiemetics are available to her.  5.  IV access: No issues with IV access at this time.  Peripheral veins continues to be in use.  6.  Breast cancer: Screening mammography obtained on March 07, 2020 showed no evidence of disease recurrence.  This will be repeated in 1 year.  7.  Follow-up: In 3 weeks for repeat evaluation.  30 minutes were dedicated to this visit.  The time was spent on updating her disease status, reviewing complications related to therapy as well as reviewing future plan of care.      Zola Button, MD 6/11/202112:42 PM

## 2020-03-17 NOTE — Patient Instructions (Signed)
[facility] Discharge Instructions for Patients Receiving Chemotherapy  Today you received the following chemotherapy agents: pembrolizumab.  To help prevent nausea and vomiting after your treatment, we encourage you to take your nausea medication as directed.   If you develop nausea and vomiting that is not controlled by your nausea medication, call the clinic.   BELOW ARE SYMPTOMS THAT SHOULD BE REPORTED IMMEDIATELY:  *FEVER GREATER THAN 100.5 F  *CHILLS WITH OR WITHOUT FEVER  NAUSEA AND VOMITING THAT IS NOT CONTROLLED WITH YOUR NAUSEA MEDICATION  *UNUSUAL SHORTNESS OF BREATH  *UNUSUAL BRUISING OR BLEEDING  TENDERNESS IN MOUTH AND THROAT WITH OR WITHOUT PRESENCE OF ULCERS  *URINARY PROBLEMS  *BOWEL PROBLEMS  UNUSUAL RASH Items with * indicate a potential emergency and should be followed up as soon as possible.  Feel free to call the clinic should you have any questions or concerns. The clinic phone number is [phone].  Please show the CHEMO ALERT CARD at check-in to the Emergency Department and triage nurse.   

## 2020-03-23 ENCOUNTER — Ambulatory Visit (INDEPENDENT_AMBULATORY_CARE_PROVIDER_SITE_OTHER): Payer: Medicare Other | Admitting: Cardiology

## 2020-03-23 ENCOUNTER — Other Ambulatory Visit: Payer: Self-pay

## 2020-03-23 ENCOUNTER — Encounter: Payer: Self-pay | Admitting: Cardiology

## 2020-03-23 VITALS — BP 142/88 | HR 76 | Ht 65.0 in | Wt 146.0 lb

## 2020-03-23 DIAGNOSIS — Z9229 Personal history of other drug therapy: Secondary | ICD-10-CM | POA: Diagnosis not present

## 2020-03-23 DIAGNOSIS — I119 Hypertensive heart disease without heart failure: Secondary | ICD-10-CM

## 2020-03-23 DIAGNOSIS — I480 Paroxysmal atrial fibrillation: Secondary | ICD-10-CM | POA: Diagnosis not present

## 2020-03-23 DIAGNOSIS — E7849 Other hyperlipidemia: Secondary | ICD-10-CM | POA: Diagnosis not present

## 2020-03-23 DIAGNOSIS — Z9221 Personal history of antineoplastic chemotherapy: Secondary | ICD-10-CM | POA: Diagnosis not present

## 2020-03-23 NOTE — Patient Instructions (Signed)
Medication Instructions:   Your physician recommends that you continue on your current medications as directed. Please refer to the Current Medication list given to you today.   *If you need a refill on your cardiac medications before your next appointment, please call your pharmacy*   Testing/Procedures:  Your physician has requested that you have an echocardiogram. Echocardiography is a painless test that uses sound waves to create images of your heart. It provides your doctor with information about the size and shape of your heart and how well your heart's chambers and valves are working. This procedure takes approximately one hour. There are no restrictions for this procedure. PER DR. [name] DO ECHO WITH STRAIN   Follow-Up: At [facility], you and your health needs are our priority.  As part of our continuing mission to provide you with exceptional heart care, we have created designated Provider Care Teams.  These Care Teams include your primary Cardiologist (physician) and Advanced Practice Providers (APPs -  Physician Assistants and Nurse Practitioners) who all work together to provide you with the care you need, when you need it.  We recommend signing up for the patient portal called "MyChart".  Sign up information is provided on this After Visit Summary.  MyChart is used to connect with patients for Virtual Visits (Telemedicine).  Patients are able to view lab/test results, encounter notes, upcoming appointments, etc.  Non-urgent messages can be sent to your provider as well.   To learn more about what you can do with MyChart, go to [email or web address].    Your next appointment:   12 month(s)  The format for your next appointment:   In Person  Provider:   [name], MD

## 2020-03-23 NOTE — Progress Notes (Signed)
Cardiology Progress Note   Evaluation Performed:  Follow-up visit  Date:  03/23/2020   ID:  Angela Carpenter, DOB July 27, 1941, MRN [id number]  Patient Location: Home Provider Location: Home  PCP:  Tisovec, [name], MD  Cardiologist:  [name], MD  Electrophysiologist:  None   Chief Complaint:  F/u for Atrial Fibrillation and HTN  History of Present Illness:    Angela Carpenter is a 79 y.o. female with h/o hypertension, hyperlipidemia and CVA in 11/2014. She underwent Linq implantation and removal, diagnosis of paroxysmal atrial fibrillation was made and she was started on warfarin for secondary prevention but later changed to Eliquis. Transesophageal Echocardiogram in 2016 at time of CVA showed LVEF 60-65%, normal RVEF, normal LA size, mild MR, otherwise normal echo. On ECG in 06/2015 she was in [place]. She is on atorvastatin for HLD.  Patient was last seen by Dr. [name] in April 2019 and was doing well from a cardiac standpoint.  She was instructed to follow-up in 1 year for repeat evaluation.  03/23/2020 the patient is coming after a year, she has been doing great, she walks 3 miles several times a week without any shortness of breath or chest pain.  She denies any palpitation dizziness or syncope.  No lower extremity edema.  She has been treated for stage IV melanoma with Pembrolizumab.  So far she has underwent 17 cycles.  She has been tolerating her medications well, and denies any myalgias or bleeding.  Past Medical History:  Diagnosis Date  . A-fib ([place])   . Atrial fibrillation ([place])    4 years ago started  . Breast cancer ([place]) 2006  . Colon polyps   . Glaucoma   . Hypercholesteremia   . Hypertension    patient denies   . Osteoporosis   . Personal history of chemotherapy 2006  . Personal history of radiation therapy 2006  . Stroke (cerebrum) ([place]) 2015   Past Surgical History:  Procedure Laterality Date  . AUGMENTATION MAMMAPLASTY Bilateral 2006   Patient had them  removed in 2008  . BREAST BIOPSY Left 2016  . BREAST LUMPECTOMY Right 2006  . COLONOSCOPY     had polyps  . EYE SURGERY Right    cataract  . MELANOMA EXCISION WITH SENTINEL LYMPH NODE BIOPSY N/A 06/02/2019   Procedure: Left lateral ANAL MELANOMA EXCISION WITH Bilateral Groin exploration and SENTINEL node mapping;  Surgeon: [name], MD;  Location: [facility];  Service: General;  Laterality: N/A;  . TONSILLECTOMY       Current Meds  Medication Sig  . acyclovir (ZOVIRAX) 400 MG tablet Take 400 mg by mouth 2 (two) times daily.  Marland Kitchen alendronate (FOSAMAX) 70 MG tablet Take 70 mg by mouth once a week. Take with a full glass of water on an empty stomach.  Marland Kitchen atorvastatin (LIPITOR) 10 MG tablet Take 1 tablet (10 mg total) by mouth daily.  . Cholecalciferol (VITAMIN D-3) 125 MCG (5000 UT) TABS Take 5,000 Units by mouth daily.   Marland Kitchen ELIQUIS 5 MG TABS tablet TAKE 1 TABLET TWICE A DAY  . metoprolol succinate (TOPROL-XL) 25 MG 24 hr tablet Take 1 tablet (25 mg total) by mouth 2 (two) times daily.  . Multiple Vitamins-Minerals (PRESERVISION AREDS 2+MULTI VIT) CAPS Take 1 capsule by mouth 2 (two) times daily.  . raloxifene (EVISTA) 60 MG tablet Take 60 mg by mouth daily.  . timolol (TIMOPTIC) 0.5 % ophthalmic solution Place 1 drop into both eyes 2 (two) times daily.  Current Facility-Administered Medications for the 03/23/20 encounter (Office Visit) with [name], MD  Medication  . 0.9 %  sodium chloride infusion     Allergies:   Patient has no known allergies.   Social History   Tobacco Use  . Smoking status: Passive Smoke Exposure - Never Smoker  . Smokeless tobacco: Never Used  . Tobacco comment: smoked in college but didn't inhale  Vaping Use  . Vaping Use: Never used  Substance Use Topics  . Alcohol use: No    Alcohol/week: 0.0 standard drinks  . Drug use: No     Family Hx: The patient's family history includes Breast cancer in her paternal aunt and paternal grandmother;  Cirrhosis in her maternal grandfather and paternal grandfather; Hypertension in her sister; Hypotension in her mother; Kidney cancer in her brother; Lung cancer in her mother; Pancreatic cancer in her father.  ROS:   Please see the history of present illness.     All other systems reviewed and are negative.   Prior CV studies:   The following studies were reviewed today:  TEE 2016: LVEF 60-65%, normal RVEF, normal LA size, mild MR, otherwise normal echo.  Labs/Other Tests and Data Reviewed:    EKG:  An ECG dated 01/13/18 was personally reviewed today and demonstrated:  NSR 75 bpm  Recent Labs: 03/17/2020: ALT 20; BUN 12; Creatinine 0.71; Hemoglobin 13.6; Platelet Count 273; Potassium 4.4; Sodium 141; TSH 1.915   Recent Lipid Panel Lab Results  Component Value Date/Time   CHOL 151 12/05/2016 07:50 AM   TRIG 69 12/05/2016 07:50 AM   HDL 72 12/05/2016 07:50 AM   CHOLHDL 2.1 12/05/2016 07:50 AM   CHOLHDL 1.8 09/05/2015 08:28 AM   LDLCALC 65 12/05/2016 07:50 AM    Wt Readings from Last 3 Encounters:  03/23/20 146 lb (66.2 kg)  03/17/20 145 lb 8 oz (66 kg)  02/24/20 146 lb 4.8 oz (66.4 kg)     Objective:    Vital Signs:  BP (!) 142/88   Pulse 76   Ht 5\' 5"  (1.651 m)   Wt 146 lb (66.2 kg)   SpO2 98%   BMI 24.30 kg/m    VITAL SIGNS:  reviewed GEN:  no acute distress EYES:  sclerae anicteric, EOMI - Extraocular Movements Intact RESPIRATORY:  normal respiratory effort, symmetric expansion CARDIOVASCULAR:  N/A SKIN:  no rash, lesions or ulcers. MUSCULOSKELETAL:  no obvious deformities. NEURO:  alert and oriented x 3, no obvious focal deficit PSYCH:  normal affect  ASSESSMENT & PLAN:    Atrial Fibrillation: detected by implantable loop after CVA.  She is on Eliquis for anticoagulation for secondary prevention of CVA.  She denies any palpitations, and is in sinus rhythm today.  2.  HTN: well controlled on current medication. 117/72 mmHg when repeated.   3. HLD: on  statin therapy w/ atorvastatin.  All lipids at follow-up.  Normal LFTs.  4. H/o Stroke: implantable loop after CVA detected Afib.  Now on Eliquis for secondary prevention.   5.  Chronic anticoagulation therapy: Patient on Eliquis for atrial fibrillation and secondary prevention given prior history of CVA.  She has no bleeding, most recent hemoglobin last week was 13.6.  6.  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  We will obtain echocardiogram with strain.   Medication Adjustments/Labs and Tests Ordered: Current medicines are reviewed at length with  the patient today.  Concerns regarding medicines are outlined above.   Tests Ordered: Orders Placed This Encounter  Procedures  . EKG 12-Lead  . ECHOCARDIOGRAM COMPLETE    Medication Changes: No orders of the defined types were placed in this encounter.   Disposition:  Follow up In 1 year with Dr. [name]  Signed, [name], MD  03/23/2020 9:39 AM    [place]

## 2020-04-07 ENCOUNTER — Inpatient Hospital Stay: Payer: Medicare Other | Attending: Oncology

## 2020-04-07 ENCOUNTER — Inpatient Hospital Stay (HOSPITAL_BASED_OUTPATIENT_CLINIC_OR_DEPARTMENT_OTHER): Payer: Medicare Other | Admitting: Oncology

## 2020-04-07 ENCOUNTER — Inpatient Hospital Stay: Payer: Medicare Other

## 2020-04-07 ENCOUNTER — Other Ambulatory Visit: Payer: Self-pay

## 2020-04-07 VITALS — BP 140/64 | HR 75 | Temp 97.8°F | Resp 18 | Ht 65.0 in | Wt 143.7 lb

## 2020-04-07 DIAGNOSIS — E039 Hypothyroidism, unspecified: Secondary | ICD-10-CM | POA: Diagnosis not present

## 2020-04-07 DIAGNOSIS — C50919 Malignant neoplasm of unspecified site of unspecified female breast: Secondary | ICD-10-CM

## 2020-04-07 DIAGNOSIS — K529 Noninfective gastroenteritis and colitis, unspecified: Secondary | ICD-10-CM | POA: Diagnosis not present

## 2020-04-07 DIAGNOSIS — J189 Pneumonia, unspecified organism: Secondary | ICD-10-CM | POA: Diagnosis not present

## 2020-04-07 DIAGNOSIS — Z5112 Encounter for antineoplastic immunotherapy: Secondary | ICD-10-CM | POA: Diagnosis not present

## 2020-04-07 DIAGNOSIS — Z79899 Other long term (current) drug therapy: Secondary | ICD-10-CM | POA: Insufficient documentation

## 2020-04-07 DIAGNOSIS — C211 Malignant neoplasm of anal canal: Secondary | ICD-10-CM | POA: Insufficient documentation

## 2020-04-07 DIAGNOSIS — Z853 Personal history of malignant neoplasm of breast: Secondary | ICD-10-CM | POA: Insufficient documentation

## 2020-04-07 LAB — CBC WITH DIFFERENTIAL (CANCER CENTER ONLY)
Abs Immature Granulocytes: 0.04 10*3/uL (ref 0.00–0.07)
Basophils Absolute: 0.2 10*3/uL — ABNORMAL HIGH (ref 0.0–0.1)
Basophils Relative: 2 %
Eosinophils Absolute: 1.2 10*3/uL — ABNORMAL HIGH (ref 0.0–0.5)
Eosinophils Relative: 15 %
HCT: 43.1 % (ref 36.0–46.0)
Hemoglobin: 13.9 g/dL (ref 12.0–15.0)
Immature Granulocytes: 1 %
Lymphocytes Relative: 19 %
Lymphs Abs: 1.6 10*3/uL (ref 0.7–4.0)
MCH: 31 pg (ref 26.0–34.0)
MCHC: 32.3 g/dL (ref 30.0–36.0)
MCV: 96 fL (ref 80.0–100.0)
Monocytes Absolute: 0.7 10*3/uL (ref 0.1–1.0)
Monocytes Relative: 8 %
Neutro Abs: 4.4 10*3/uL (ref 1.7–7.7)
Neutrophils Relative %: 55 %
Platelet Count: 303 10*3/uL (ref 150–400)
RBC: 4.49 MIL/uL (ref 3.87–5.11)
RDW: 13.5 % (ref 11.5–15.5)
WBC Count: 8 10*3/uL (ref 4.0–10.5)
nRBC: 0 % (ref 0.0–0.2)

## 2020-04-07 LAB — CMP (CANCER CENTER ONLY)
ALT: 23 U/L (ref 0–44)
AST: 21 U/L (ref 15–41)
Albumin: 3.7 g/dL (ref 3.5–5.0)
Alkaline Phosphatase: 93 U/L (ref 38–126)
Anion gap: 8 (ref 5–15)
BUN: 13 mg/dL (ref 8–23)
CO2: 28 mmol/L (ref 22–32)
Calcium: 8.9 mg/dL (ref 8.9–10.3)
Chloride: 105 mmol/L (ref 98–111)
Creatinine: 0.71 mg/dL (ref 0.44–1.00)
GFR, Est AFR Am: >60 mL/min (ref >60)
GFR, Estimated: >60 mL/min (ref >60)
Glucose, Bld: 80 mg/dL (ref 70–99)
Potassium: 4.5 mmol/L (ref 3.5–5.1)
Sodium: 141 mmol/L (ref 135–145)
Total Bilirubin: 0.3 mg/dL (ref 0.3–1.2)
Total Protein: 6.5 g/dL (ref 6.5–8.1)

## 2020-04-07 MED ORDER — SODIUM CHLORIDE 0.9 % IV SOLN
Freq: Once | INTRAVENOUS | Status: AC
  Filled 2020-04-07: qty 250

## 2020-04-07 MED ORDER — SODIUM CHLORIDE 0.9 % IV SOLN
200.0000 mg | Freq: Once | INTRAVENOUS | Status: AC
  Administered 2020-04-07: 200 mg via INTRAVENOUS
  Filled 2020-04-07: qty 8

## 2020-04-07 NOTE — Progress Notes (Signed)
Hematology and Oncology Follow Up Visit  Angela Carpenter [id number] 07/10/1941 79 y.o. 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  She was found to have a pelvic nodule on PET imaging.    Prior Therapy:   She is status post perianal excision in August 2020.  Current therapy: Pembrolizumab 200 mg every 3 weeks started on July 29, 2019.  She is here for cycle 14.  Interim History: Angela Carpenter presents today for repeat evaluation.  Since the last visit, she reports no major changes in her health.  She denies any recent nausea, vomiting or abdominal pain.  She denies any cough or shortness of breath.  She denies any pelvic masses or discomfort.         Medications: Updated on review. Current Outpatient Medications  Medication Sig Dispense Refill  . acyclovir (ZOVIRAX) 400 MG tablet Take 400 mg by mouth 2 (two) times daily.    Marland Kitchen alendronate (FOSAMAX) 70 MG tablet Take 70 mg by mouth once a week. Take with a full glass of water on an empty stomach.    Marland Kitchen atorvastatin (LIPITOR) 10 MG tablet Take 1 tablet (10 mg total) by mouth daily. 90 tablet 3  . Cholecalciferol (VITAMIN D-3) 125 MCG (5000 UT) TABS Take 5,000 Units by mouth daily.     Marland Kitchen ELIQUIS 5 MG TABS tablet TAKE 1 TABLET TWICE A DAY 180 tablet 1  . metoprolol succinate (TOPROL-XL) 25 MG 24 hr tablet Take 1 tablet (25 mg total) by mouth 2 (two) times daily. 180 tablet 3  . Multiple Vitamins-Minerals (PRESERVISION AREDS 2+MULTI VIT) CAPS Take 1 capsule by mouth 2 (two) times daily.    . raloxifene (EVISTA) 60 MG tablet Take 60 mg by mouth daily.    . timolol (TIMOPTIC) 0.5 % ophthalmic solution Place 1 drop into both eyes 2 (two) times daily.     Current Facility-Administered Medications  Medication Dose Route Frequency Provider Last Rate Last Admin  . 0.9 %  sodium chloride infusion  500 mL  Intravenous Continuous Danis, Estill Cotta III, MD         Allergies: No Known Allergies     Physical Exam:        Blood pressure 140/64, pulse 75, temperature 97.8 F (36.6 C), temperature source Temporal, resp. rate 18, height 5\' 5"  (1.651 m), weight 143 lb 11.2 oz (65.2 kg), SpO2 100 %.      ECOG: 1     General appearance: Alert, awake without any distress. Head: Atraumatic without abnormalities Oropharynx: Without any thrush or ulcers. Eyes: No scleral icterus. Lymph nodes: No lymphadenopathy noted in the cervical, supraclavicular, or axillary nodes Heart:regular rate and rhythm, without any murmurs or gallops.   Lung: Clear to auscultation without any rhonchi, wheezes or dullness to percussion. Abdomin: Soft, nontender without any shifting dullness or ascites. Musculoskeletal: No clubbing or cyanosis. Neurological: No motor or sensory deficits. Skin: No rashes or lesions.                 Lab Results: Lab Results  Component Value Date   WBC 9.5 03/17/2020   HGB 13.6 03/17/2020   HCT 42.5 03/17/2020   MCV 96.6 03/17/2020   PLT 273 03/17/2020     Chemistry      Component Value Date/Time   NA 141 03/17/2020 1229   NA 138 12/05/2016 0750  K 4.4 03/17/2020 1229   CL 104 03/17/2020 1229   CO2 28 03/17/2020 1229   BUN 12 03/17/2020 1229   BUN 15 12/05/2016 0750   CREATININE 0.71 03/17/2020 1229   CREATININE 0.70 09/05/2015 0828      Component Value Date/Time   CALCIUM 9.3 03/17/2020 1229   ALKPHOS 92 03/17/2020 1229   AST 20 03/17/2020 1229   ALT 20 03/17/2020 1229   BILITOT 0.4 03/17/2020 1229          Impression and Plan:  79 year old with:  1.   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    She continues to have excellent response to Pembrolizumab without any major complications.  Risks and benefits of continuing this treatment long-term were reviewed.   Potential complications including dermatitis, fatigue as well as immune mediated complications were reiterated.  She is agreeable to proceed and plan is to repeat imaging studies before her August cycle of treatment.     2.  Immune mediated complications: Long-term complication including pneumonitis, colitis and thyroid disease were reiterated.  She is not experiencing any at this time.  3. Dermatology surveillance: No recent dermatological changes.  She has a dermatology follow-up regularly.  4.  Antiemetics: Compazine is available to her without any recent nausea or vomiting.  5.  IV access: Peripheral veins currently in use without any recent issues.  6.  Breast cancer: She is up-to-date without any evidence of recurrent malignancy.  7.  Follow-up: She will return in 3 weeks for subsequent cycle of therapy.  30 minutes were spent on this encounter.  Time was spent dedicated to reviewing her disease status, discussing treatment options addressing complications related to her cancer and cancer therapy.     Angela Button, MD 7/2/20219:12 AM

## 2020-04-14 ENCOUNTER — Telehealth: Payer: Self-pay | Admitting: Oncology

## 2020-04-14 NOTE — Telephone Encounter (Signed)
Scheduled per 07/02 los, patient has been called and notified. 

## 2020-04-17 ENCOUNTER — Ambulatory Visit (HOSPITAL_COMMUNITY): Payer: Medicare Other | Attending: Cardiology

## 2020-04-17 ENCOUNTER — Other Ambulatory Visit: Payer: Self-pay

## 2020-04-17 DIAGNOSIS — I480 Paroxysmal atrial fibrillation: Secondary | ICD-10-CM | POA: Diagnosis not present

## 2020-04-17 DIAGNOSIS — I119 Hypertensive heart disease without heart failure: Secondary | ICD-10-CM | POA: Insufficient documentation

## 2020-04-17 DIAGNOSIS — Z9221 Personal history of antineoplastic chemotherapy: Secondary | ICD-10-CM | POA: Diagnosis not present

## 2020-04-28 ENCOUNTER — Other Ambulatory Visit: Payer: Self-pay

## 2020-04-28 ENCOUNTER — Inpatient Hospital Stay: Payer: Medicare Other

## 2020-04-28 ENCOUNTER — Telehealth: Payer: Self-pay | Admitting: Oncology

## 2020-04-28 ENCOUNTER — Inpatient Hospital Stay (HOSPITAL_BASED_OUTPATIENT_CLINIC_OR_DEPARTMENT_OTHER): Payer: Medicare Other | Admitting: Oncology

## 2020-04-28 VITALS — BP 137/71 | HR 68 | Temp 97.0°F | Resp 17 | Wt 144.4 lb

## 2020-04-28 DIAGNOSIS — C211 Malignant neoplasm of anal canal: Secondary | ICD-10-CM | POA: Diagnosis not present

## 2020-04-28 DIAGNOSIS — J189 Pneumonia, unspecified organism: Secondary | ICD-10-CM | POA: Diagnosis not present

## 2020-04-28 DIAGNOSIS — Z5112 Encounter for antineoplastic immunotherapy: Secondary | ICD-10-CM | POA: Diagnosis not present

## 2020-04-28 DIAGNOSIS — Z79899 Other long term (current) drug therapy: Secondary | ICD-10-CM | POA: Diagnosis not present

## 2020-04-28 DIAGNOSIS — E039 Hypothyroidism, unspecified: Secondary | ICD-10-CM | POA: Diagnosis not present

## 2020-04-28 DIAGNOSIS — C50919 Malignant neoplasm of unspecified site of unspecified female breast: Secondary | ICD-10-CM | POA: Diagnosis not present

## 2020-04-28 DIAGNOSIS — K529 Noninfective gastroenteritis and colitis, unspecified: Secondary | ICD-10-CM | POA: Diagnosis not present

## 2020-04-28 LAB — CBC WITH DIFFERENTIAL (CANCER CENTER ONLY)
Abs Immature Granulocytes: 0.04 10*3/uL (ref 0.00–0.07)
Basophils Absolute: 0.1 10*3/uL (ref 0.0–0.1)
Basophils Relative: 1 %
Eosinophils Absolute: 0.6 10*3/uL — ABNORMAL HIGH (ref 0.0–0.5)
Eosinophils Relative: 8 %
HCT: 40.3 % (ref 36.0–46.0)
Hemoglobin: 12.9 g/dL (ref 12.0–15.0)
Immature Granulocytes: 1 %
Lymphocytes Relative: 18 %
Lymphs Abs: 1.4 10*3/uL (ref 0.7–4.0)
MCH: 30 pg (ref 26.0–34.0)
MCHC: 32 g/dL (ref 30.0–36.0)
MCV: 93.7 fL (ref 80.0–100.0)
Monocytes Absolute: 0.7 10*3/uL (ref 0.1–1.0)
Monocytes Relative: 9 %
Neutro Abs: 5 10*3/uL (ref 1.7–7.7)
Neutrophils Relative %: 63 %
Platelet Count: 258 10*3/uL (ref 150–400)
RBC: 4.3 MIL/uL (ref 3.87–5.11)
RDW: 13.6 % (ref 11.5–15.5)
WBC Count: 7.9 10*3/uL (ref 4.0–10.5)
nRBC: 0 % (ref 0.0–0.2)

## 2020-04-28 LAB — CMP (CANCER CENTER ONLY)
ALT: 22 U/L (ref 0–44)
AST: 22 U/L (ref 15–41)
Albumin: 3.7 g/dL (ref 3.5–5.0)
Alkaline Phosphatase: 86 U/L (ref 38–126)
Anion gap: 10 (ref 5–15)
BUN: 11 mg/dL (ref 8–23)
CO2: 27 mmol/L (ref 22–32)
Calcium: 9.4 mg/dL (ref 8.9–10.3)
Chloride: 104 mmol/L (ref 98–111)
Creatinine: 0.69 mg/dL (ref 0.44–1.00)
GFR, Est AFR Am: >60 mL/min (ref >60)
GFR, Estimated: >60 mL/min (ref >60)
Glucose, Bld: 87 mg/dL (ref 70–99)
Potassium: 4.5 mmol/L (ref 3.5–5.1)
Sodium: 141 mmol/L (ref 135–145)
Total Bilirubin: 0.5 mg/dL (ref 0.3–1.2)
Total Protein: 6.4 g/dL — ABNORMAL LOW (ref 6.5–8.1)

## 2020-04-28 LAB — TSH: TSH: 2.114 u[IU]/mL (ref 0.308–3.960)

## 2020-04-28 MED ORDER — SODIUM CHLORIDE 0.9 % IV SOLN
200.0000 mg | Freq: Once | INTRAVENOUS | Status: AC
  Administered 2020-04-28: 200 mg via INTRAVENOUS
  Filled 2020-04-28: qty 8

## 2020-04-28 MED ORDER — SODIUM CHLORIDE 0.9 % IV SOLN
Freq: Once | INTRAVENOUS | Status: AC
  Filled 2020-04-28: qty 250

## 2020-04-28 NOTE — Patient Instructions (Signed)
[facility] Discharge Instructions for Patients Receiving Chemotherapy  Today you received the following chemotherapy agents: pembrolizumab.  To help prevent nausea and vomiting after your treatment, we encourage you to take your nausea medication as directed.   If you develop nausea and vomiting that is not controlled by your nausea medication, call the clinic.   BELOW ARE SYMPTOMS THAT SHOULD BE REPORTED IMMEDIATELY:  *FEVER GREATER THAN 100.5 F  *CHILLS WITH OR WITHOUT FEVER  NAUSEA AND VOMITING THAT IS NOT CONTROLLED WITH YOUR NAUSEA MEDICATION  *UNUSUAL SHORTNESS OF BREATH  *UNUSUAL BRUISING OR BLEEDING  TENDERNESS IN MOUTH AND THROAT WITH OR WITHOUT PRESENCE OF ULCERS  *URINARY PROBLEMS  *BOWEL PROBLEMS  UNUSUAL RASH Items with * indicate a potential emergency and should be followed up as soon as possible.  Feel free to call the clinic should you have any questions or concerns. The clinic phone number is [phone].  Please show the CHEMO ALERT CARD at check-in to the Emergency Department and triage nurse.   

## 2020-04-28 NOTE — Progress Notes (Signed)
Hematology and Oncology Follow Up Visit  Angela Carpenter [id number] 02-12-1941 79 y.o. 04/28/2020 10:10 AM Tisovec, Angela Carpenter, MDTisovec, Angela Him, MD   Principle Diagnosis: 79 year old woman with mucosal melanoma diagnosed in August 2020.  She was found to have limited stage IV disease arising from the anal canal with pelvic lymph node involvement.    Prior Therapy:   She is status post perianal excision in August 2020.  Current therapy: Pembrolizumab 200 mg every 3 weeks started on July 29, 2019.  She is here for cycle 15.  Interim History: [name] is here for a follow-up visit.  Since the last visit, she reports no major changes in her health.  She continues to tolerate treatment without any recent complaints.  She denies any nausea, fatigue or skin rash.  She denies any changes in her bowel habits including diarrhea or abdominal pain.  She denies any shortness of breath or respiratory complaints.  Performance status quality of life remain excellent.         Medications: Unchanged on review. Current Outpatient Medications  Medication Sig Dispense Refill  . acyclovir (ZOVIRAX) 400 MG tablet Take 400 mg by mouth 2 (two) times daily.    Marland Kitchen alendronate (FOSAMAX) 70 MG tablet Take 70 mg by mouth once a week. Take with a full glass of water on an empty stomach.    Marland Kitchen atorvastatin (LIPITOR) 10 MG tablet Take 1 tablet (10 mg total) by mouth daily. 90 tablet 3  . Cholecalciferol (VITAMIN D-3) 125 MCG (5000 UT) TABS Take 5,000 Units by mouth daily.     Marland Kitchen ELIQUIS 5 MG TABS tablet TAKE 1 TABLET TWICE A DAY 180 tablet 1  . metoprolol succinate (TOPROL-XL) 25 MG 24 hr tablet Take 1 tablet (25 mg total) by mouth 2 (two) times daily. 180 tablet 3  . Multiple Vitamins-Minerals (PRESERVISION AREDS 2+MULTI VIT) CAPS Take 1 capsule by mouth 2 (two) times daily.    . raloxifene (EVISTA) 60 MG tablet Take 60 mg by mouth daily.    . timolol (TIMOPTIC) 0.5 % ophthalmic solution Place 1 drop into  both eyes 2 (two) times daily.     Current Facility-Administered Medications  Medication Dose Route Frequency Provider Last Rate Last Admin  . 0.9 %  sodium chloride infusion  500 mL Intravenous Continuous Danis, Estill Cotta III, MD         Allergies: No Known Allergies     Physical Exam:     Blood pressure (!) 137/71, pulse 68, temperature (!) 97 F (36.1 C), temperature source Temporal, resp. rate 17, weight 144 lb 6.4 oz (65.5 kg), SpO2 100 %.          ECOG: 1   General appearance: Comfortable appearing without any discomfort Head: Normocephalic without any trauma Oropharynx: Mucous membranes are moist and pink without any thrush or ulcers. Eyes: Pupils are equal and round reactive to light. Lymph nodes: No cervical, supraclavicular, inguinal or axillary lymphadenopathy.   Heart:regular rate and rhythm.  S1 and S2 without leg edema. Lung: Clear without any rhonchi or wheezes.  No dullness to percussion. Abdomin: Soft, nontender, nondistended with good bowel sounds.  No hepatosplenomegaly. Musculoskeletal: No joint deformity or effusion.  Full range of motion noted. Neurological: No deficits noted on motor, sensory and deep tendon reflex exam. Skin: No petechial rash or dryness.  Appeared moist.                   Lab Results: Lab Results  Component Value Date   WBC 8.0 04/07/2020   HGB 13.9 04/07/2020   HCT 43.1 04/07/2020   MCV 96.0 04/07/2020   PLT 303 04/07/2020     Chemistry      Component Value Date/Time   NA 141 04/07/2020 0901   NA 138 12/05/2016 0750   K 4.5 04/07/2020 0901   CL 105 04/07/2020 0901   CO2 28 04/07/2020 0901   BUN 13 04/07/2020 0901   BUN 15 12/05/2016 0750   CREATININE 0.71 04/07/2020 0901   CREATININE 0.70 09/05/2015 0828      Component Value Date/Time   CALCIUM 8.9 04/07/2020 0901   ALKPHOS 93 04/07/2020 0901   AST 21 04/07/2020 0901   ALT 23 04/07/2020 0901   BILITOT 0.3 04/07/2020 0901           Impression and Plan:  79 year old with:  1.   Mucosal melanoma of the anal canal.  She presented with stage IV disease with pelvic nodule and labial involvement in August 2020.    She continues to tolerate pembrolizumab without any major complaints.  Risks and benefits of continuing this treatment long-term were reviewed.  The plan is to repeat imaging studies before the next visit and assess the duration of treatment at that time.  Pending her response to therapy she might require at least 1 year of therapy.  Salvage therapy with combined immune agents with ipilimumab and nivolumab would be used if she developed progression of disease.  She is agreeable to proceed with this plan.     2.  Immune mediated complications: Autoimmune complications including pneumonitis, colitis, hypothyroidism and hepatitis among others were reiterated.  He is not experiencing any at this time.   3. Dermatology surveillance: She continues to be up-to-date at this time.  4.  Antiemetics: No nausea or vomiting reported.  Compazine is available to her.  5.  IV access: Continue to have no issues with peripheral pain use.  Port-A-Cath options will be deferred.  6.  Breast cancer: No evidence of malignant recurrence currently.  She continues to be on active surveillance.  7.  Follow-up: 3 weeks for the next cycle of therapy.  30 minutes were dedicated to this visit.  The time was spent on updating her disease status, discussing complications related to her current therapy and future plan of care reviewed.    Zola Button, MD 7/23/202110:10 AM

## 2020-04-28 NOTE — Telephone Encounter (Signed)
Scheduled appointments per 7/23 los. Left message on patient voicemail with updated appointments date and time.

## 2020-05-08 DIAGNOSIS — H401131 Primary open-angle glaucoma, bilateral, mild stage: Secondary | ICD-10-CM | POA: Diagnosis not present

## 2020-05-08 DIAGNOSIS — H524 Presbyopia: Secondary | ICD-10-CM | POA: Diagnosis not present

## 2020-05-12 ENCOUNTER — Ambulatory Visit (HOSPITAL_COMMUNITY)
Admission: RE | Admit: 2020-05-12 | Discharge: 2020-05-12 | Disposition: A | Discharge status: Home / Self Care | Payer: Medicare Other | Source: Ambulatory Visit | Attending: Oncology | Admitting: Oncology

## 2020-05-12 ENCOUNTER — Other Ambulatory Visit: Payer: Self-pay

## 2020-05-12 DIAGNOSIS — C211 Malignant neoplasm of anal canal: Secondary | ICD-10-CM

## 2020-05-12 DIAGNOSIS — C50919 Malignant neoplasm of unspecified site of unspecified female breast: Secondary | ICD-10-CM

## 2020-05-12 DIAGNOSIS — C439 Malignant melanoma of skin, unspecified: Secondary | ICD-10-CM | POA: Diagnosis not present

## 2020-05-12 LAB — GLUCOSE, CAPILLARY: Glucose-Capillary: 88 mg/dL (ref 70–99)

## 2020-05-12 IMAGING — CT NM PET IMAGE RESTAGE (PS) WHOLE BODY
8 series · 25 of 25 positions shown · non-contrast
Comparison: Multiple prior PET CTs.  The most recent is [DATE]

CLINICAL DATA: Subsequent treatment strategy for metastatic
melanoma.

EXAM:
NUCLEAR MEDICINE PET WHOLE BODY
TECHNIQUE: 7.1 mCi F-18 FDG was injected intravenously. Full-ring PET imaging
was performed from the skull base to thigh after the radiotracer. CT
data was obtained and used for attenuation correction and anatomic
localization.
Fasting blood glucose: 88 mg/dl

[Series 3: pet wb ac · axial · 5.0mm · 4.07mm/px · z∈[+196,+1984]mm · 5 of 448 slices shown]
[im 1/448]
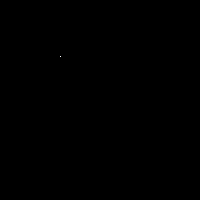
[im 112/448]
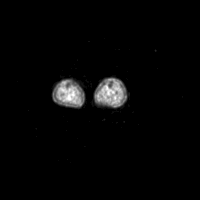
[im 224/448]
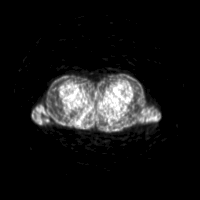
[im 336/448]
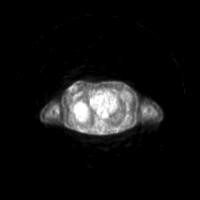
[im 448/448]
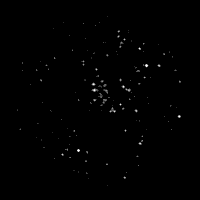

[Series 4: ct wb 5.0 hd_fov · axial · 5.0mm · 1.27mm/px · z∈[+192,+1984]mm · 5 of 449 slices shown]
[im 1/449  full-range]
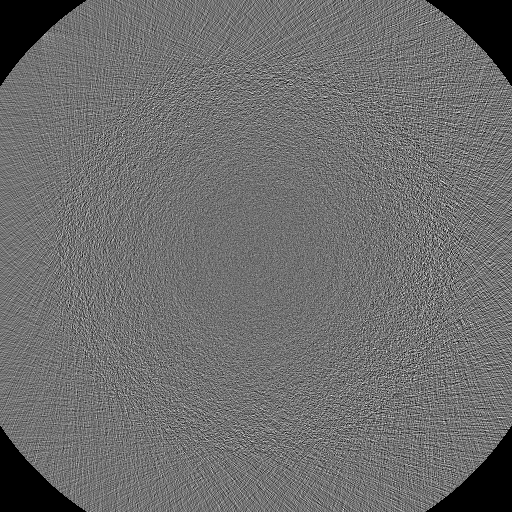
[im 113/449  soft-tissue]
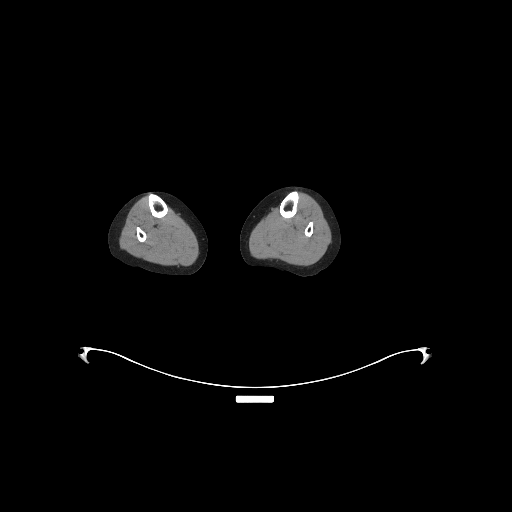
[im 225/449  soft-tissue]
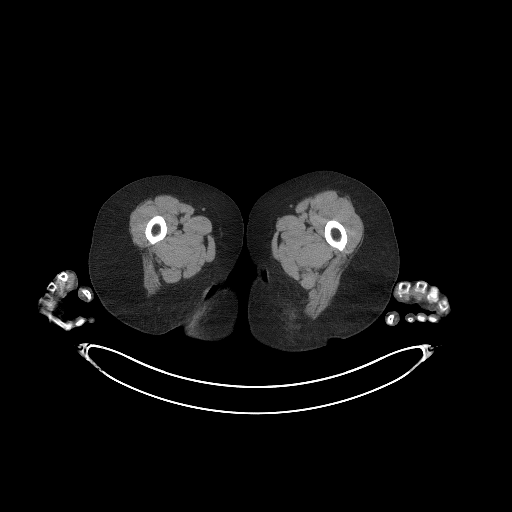
[im 337/449  soft-tissue]
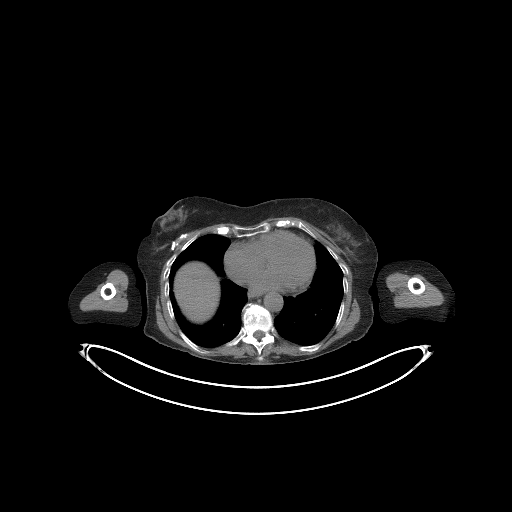
[im 449/449  soft-tissue]
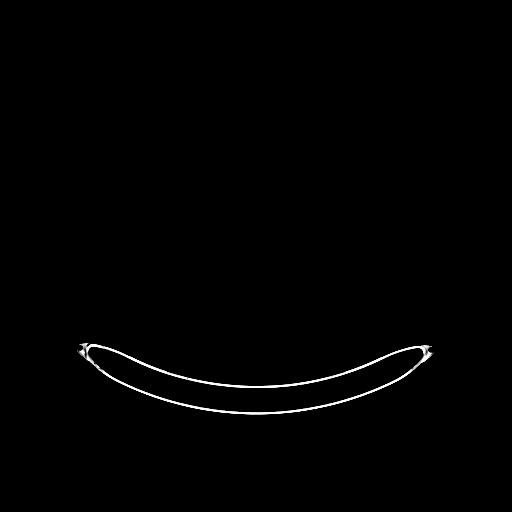

[Series 5: pet wb nac · axial · 5.0mm · 4.07mm/px · z∈[+208,+1984]mm · 6 of 445 slices shown]
[im 1/445  full-range]
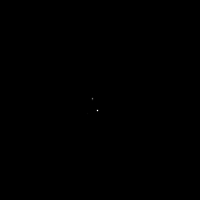
[im 89/445]
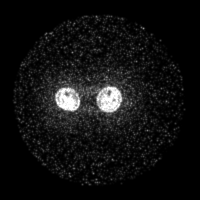
[im 178/445]
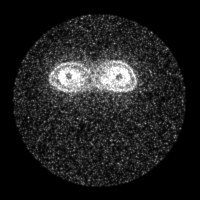
[im 267/445]
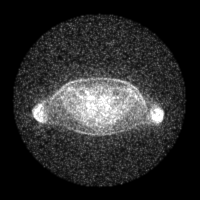
[im 356/445]
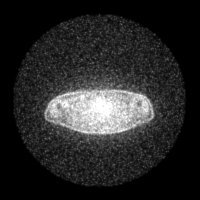
[im 445/445]
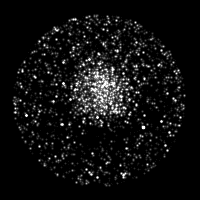

[Series 8: ct wb 5.0 b70f lung_bone · axial · 5.0mm · 0.62mm/px · 1 of 62 slices shown]
[im 1/62  lung]
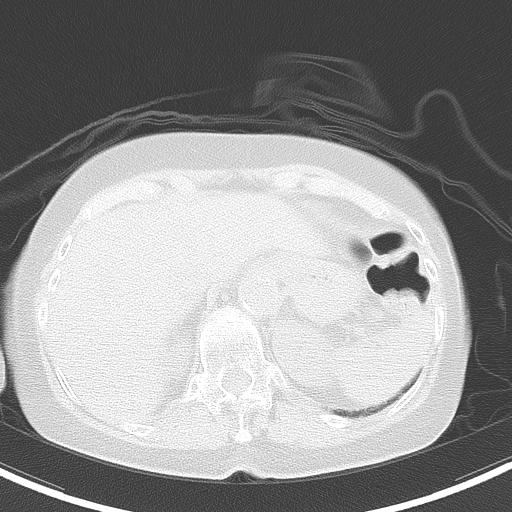

[Series 603: range-ct wb 5.0 hd_fov-cor-<alpha range> · 1 of 82 slices shown]
[im 1/82]
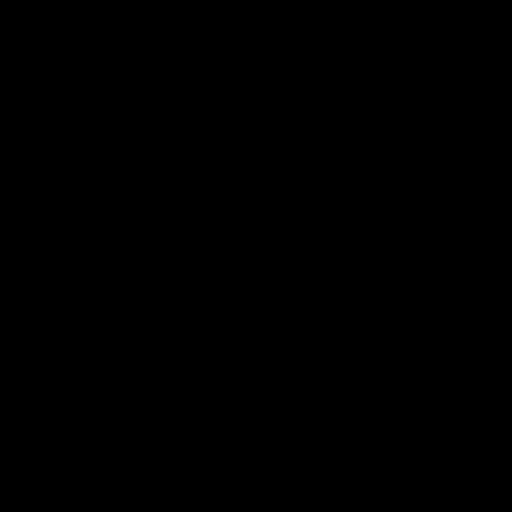

[Series 604: mip range 5 · coronal · 3.71mm/px · 1 of 32 slices shown]
[im 1/32]
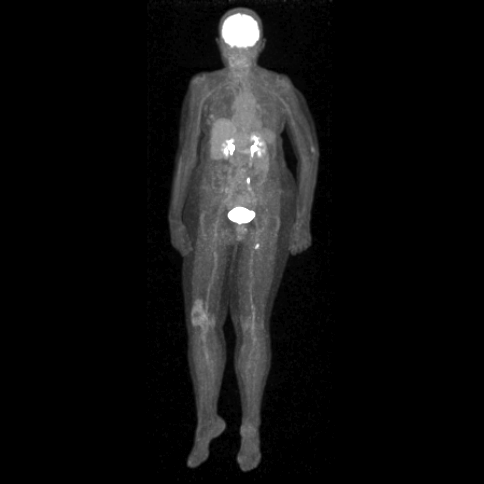

[Series 605: range-ct wb 5.0 hd_fov-tra-<alpha range> · 5 of 412 slices shown]
[im 1/412]
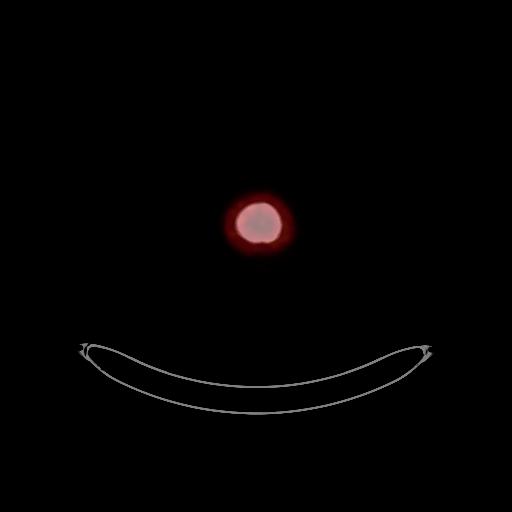
[im 103/412]
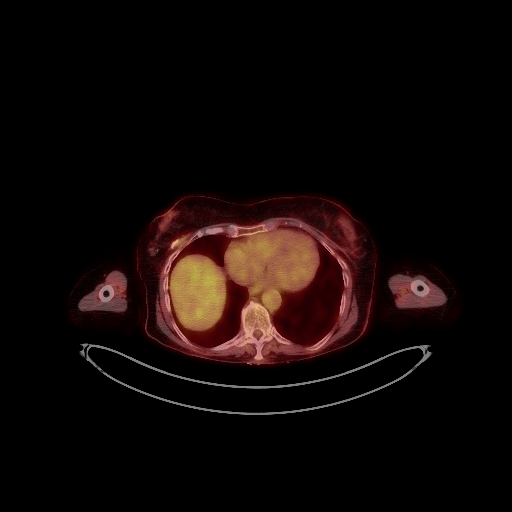
[im 206/412]
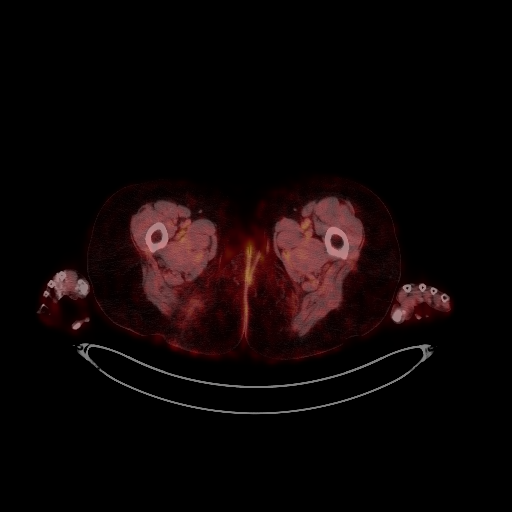
[im 309/412]
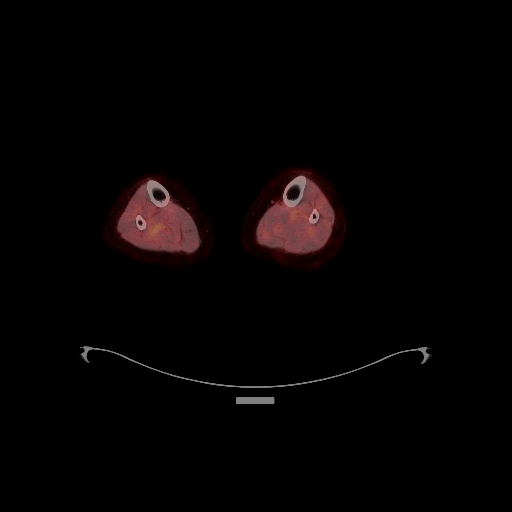
[im 412/412]
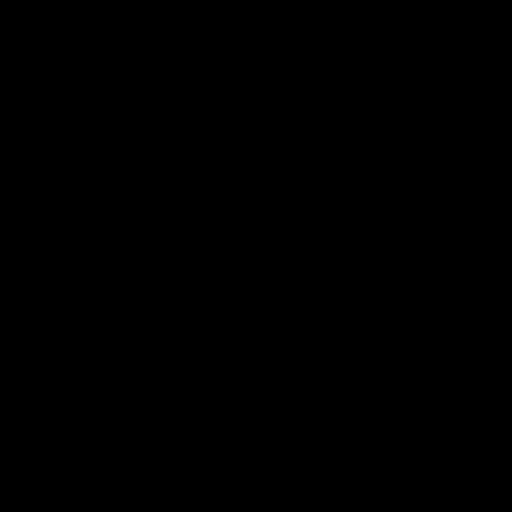

[Series 1702: results mm oncology reading · 5.0mm · 1.06mm/px · 1 of 5 slices shown]
[im 1/5]
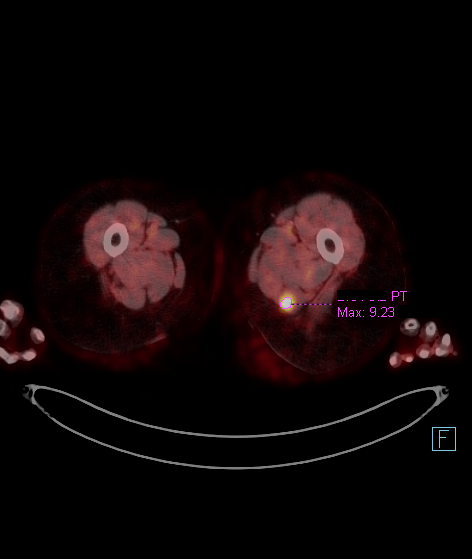

[25 of 25 positions shown; findings below may reference images not displayed]

FINDINGS: Mediastinal blood pool activity: SUV max

HEAD/NECK: No hypermetabolic activity in the scalp. No
hypermetabolic cervical lymph nodes.

Incidental CT findings: none

CHEST: Stable surgical changes involving the right breast. Small
focus of hypermetabolism adjacent to surgical clips and or
calcification. SUV max is [REDACTED] mm soft tissue density is likely
scarring change. No interval change.

No enlarged or hypermetabolic axillary or supraclavicular nodes. No
mediastinal or hilar adenopathy. No worrisome pulmonary nodules to
suggest pulmonary metastatic disease.

Incidental CT findings: none

ABDOMEN/PELVIS: No findings for abdominal/pelvic metastatic disease.
The solid abdominal organs are unremarkable. No mesenteric or
retroperitoneal mass or adenopathy.

Stable 5 mm soft tissue nodule in the left groin area located just
anterior to the pectineus muscle. SUV max is 4.2 and was previously
5.46.

No perirectal or perianal disease is identified.

Incidental CT findings: none

SKELETON: Focus of hypermetabolism is noted in the L5 spinous
process associated mild cortical thickening and sclerosis. Findings
suspicious for metastatic focus. SUV max is 3.65. This was
previously 3.46.

I do not see any other definite bone lesions.

Incidental CT findings: none

EXTREMITIES: Stable hypermetabolic lesion in the left posterior
thigh musculature consistent with a muscle metastasis. SUV max is
9.23 and was previously 8.49.

No other lower extremity disease is identified.

Incidental CT findings: none
IMPRESSION: 1. Overall stable PET-CT as detailed above.
2. No new or progressive findings are identified.

## 2020-05-12 MED ORDER — FLUDEOXYGLUCOSE F - 18 (FDG) INJECTION
7.1000 | Freq: Once | INTRAVENOUS | Status: AC | PRN
  Administered 2020-05-12: 7.1 via INTRAVENOUS

## 2020-05-18 ENCOUNTER — Ambulatory Visit: Payer: Medicare Other

## 2020-05-18 ENCOUNTER — Other Ambulatory Visit: Payer: Medicare Other

## 2020-05-19 ENCOUNTER — Inpatient Hospital Stay: Payer: Medicare Other | Attending: Oncology | Admitting: Oncology

## 2020-05-19 ENCOUNTER — Inpatient Hospital Stay: Payer: Medicare Other

## 2020-05-19 ENCOUNTER — Encounter: Payer: Self-pay | Admitting: Oncology

## 2020-05-19 ENCOUNTER — Other Ambulatory Visit: Payer: Self-pay

## 2020-05-19 VITALS — BP 138/68 | HR 75 | Temp 97.5°F | Resp 20 | Ht 65.0 in | Wt 144.4 lb

## 2020-05-19 DIAGNOSIS — Z5112 Encounter for antineoplastic immunotherapy: Secondary | ICD-10-CM | POA: Diagnosis not present

## 2020-05-19 DIAGNOSIS — Z79899 Other long term (current) drug therapy: Secondary | ICD-10-CM | POA: Insufficient documentation

## 2020-05-19 DIAGNOSIS — E039 Hypothyroidism, unspecified: Secondary | ICD-10-CM

## 2020-05-19 DIAGNOSIS — Z853 Personal history of malignant neoplasm of breast: Secondary | ICD-10-CM | POA: Insufficient documentation

## 2020-05-19 DIAGNOSIS — C211 Malignant neoplasm of anal canal: Secondary | ICD-10-CM

## 2020-05-19 DIAGNOSIS — C50919 Malignant neoplasm of unspecified site of unspecified female breast: Secondary | ICD-10-CM

## 2020-05-19 LAB — CBC WITH DIFFERENTIAL (CANCER CENTER ONLY)
Abs Immature Granulocytes: 0.06 10*3/uL (ref 0.00–0.07)
Basophils Absolute: 0.1 10*3/uL (ref 0.0–0.1)
Basophils Relative: 1 %
Eosinophils Absolute: 0.6 10*3/uL — ABNORMAL HIGH (ref 0.0–0.5)
Eosinophils Relative: 6 %
HCT: 40.8 % (ref 36.0–46.0)
Hemoglobin: 13.3 g/dL (ref 12.0–15.0)
Immature Granulocytes: 1 %
Lymphocytes Relative: 15 %
Lymphs Abs: 1.5 10*3/uL (ref 0.7–4.0)
MCH: 30.9 pg (ref 26.0–34.0)
MCHC: 32.6 g/dL (ref 30.0–36.0)
MCV: 94.7 fL (ref 80.0–100.0)
Monocytes Absolute: 0.9 10*3/uL (ref 0.1–1.0)
Monocytes Relative: 8 %
Neutro Abs: 7.3 10*3/uL (ref 1.7–7.7)
Neutrophils Relative %: 69 %
Platelet Count: 293 10*3/uL (ref 150–400)
RBC: 4.31 MIL/uL (ref 3.87–5.11)
RDW: 13.5 % (ref 11.5–15.5)
WBC Count: 10.5 10*3/uL (ref 4.0–10.5)
nRBC: 0 % (ref 0.0–0.2)

## 2020-05-19 LAB — CMP (CANCER CENTER ONLY)
ALT: 20 U/L (ref 0–44)
AST: 19 U/L (ref 15–41)
Albumin: 3.8 g/dL (ref 3.5–5.0)
Alkaline Phosphatase: 95 U/L (ref 38–126)
Anion gap: 9 (ref 5–15)
BUN: 12 mg/dL (ref 8–23)
CO2: 26 mmol/L (ref 22–32)
Calcium: 9.7 mg/dL (ref 8.9–10.3)
Chloride: 104 mmol/L (ref 98–111)
Creatinine: 0.7 mg/dL (ref 0.44–1.00)
GFR, Est AFR Am: >60 mL/min (ref >60)
GFR, Estimated: >60 mL/min (ref >60)
Glucose, Bld: 92 mg/dL (ref 70–99)
Potassium: 4.1 mmol/L (ref 3.5–5.1)
Sodium: 139 mmol/L (ref 135–145)
Total Bilirubin: 0.5 mg/dL (ref 0.3–1.2)
Total Protein: 6.7 g/dL (ref 6.5–8.1)

## 2020-05-19 MED ORDER — SODIUM CHLORIDE 0.9 % IV SOLN
200.0000 mg | Freq: Once | INTRAVENOUS | Status: AC
  Administered 2020-05-19: 200 mg via INTRAVENOUS
  Filled 2020-05-19: qty 8

## 2020-05-19 MED ORDER — SODIUM CHLORIDE 0.9 % IV SOLN
Freq: Once | INTRAVENOUS | Status: AC
  Filled 2020-05-19: qty 250

## 2020-05-19 NOTE — Progress Notes (Signed)
Hematology and Oncology Follow Up Visit  Angela Carpenter [id number] 05-Jan-1941 79 y.o. 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 Marland Kitchen    Prior Therapy:   She is status post perianal excision in August 2020.  Current therapy: Pembrolizumab 200 mg every 3 weeks started on July 29, 2019.  She is status post 14 cycles and here for cycle 15  Interim History: Angela Carpenter returns today for a follow-up evaluation.  Since the last visit, she reports no major changes in her health.  She has tolerated Pembrolizumab without any major complaints.  She denies any nausea, vomiting or abdominal pain.  She denies recent hospitalization or illnesses.  She denies any change in her bowel habits or respiratory complaints.         Medications: Reviewed without changes. Current Outpatient Medications  Medication Sig Dispense Refill  . acyclovir (ZOVIRAX) 400 MG tablet Take 400 mg by mouth 2 (two) times daily.    Marland Kitchen alendronate (FOSAMAX) 70 MG tablet Take 70 mg by mouth once a week. Take with a full glass of water on an empty stomach.    Marland Kitchen atorvastatin (LIPITOR) 10 MG tablet Take 1 tablet (10 mg total) by mouth daily. 90 tablet 3  . Cholecalciferol (VITAMIN D-3) 125 MCG (5000 UT) TABS Take 5,000 Units by mouth daily.     Marland Kitchen ELIQUIS 5 MG TABS tablet TAKE 1 TABLET TWICE A DAY 180 tablet 1  . metoprolol succinate (TOPROL-XL) 25 MG 24 hr tablet Take 1 tablet (25 mg total) by mouth 2 (two) times daily. 180 tablet 3  . Multiple Vitamins-Minerals (PRESERVISION AREDS 2+MULTI VIT) CAPS Take 1 capsule by mouth 2 (two) times daily.    . raloxifene (EVISTA) 60 MG tablet Take 60 mg by mouth daily.    . timolol (TIMOPTIC) 0.5 % ophthalmic solution Place 1 drop into both eyes 2 (two) times daily.     Current Facility-Administered Medications   Medication Dose Route Frequency Provider Last Rate Last Admin  . 0.9 %  sodium chloride infusion  500 mL Intravenous Continuous Danis, Estill Cotta III, MD         Allergies: No Known Allergies     Physical Exam:         Blood pressure 138/68, pulse 75, temperature (!) 97.5 F (36.4 C), temperature source Temporal, resp. rate 20, height 5\' 5"  (1.651 m), weight 144 lb 6.4 oz (65.5 kg), SpO2 100 %.      ECOG: 1   General appearance: Alert, awake without any distress. Head: Atraumatic without abnormalities Oropharynx: Without any thrush or ulcers. Eyes: No scleral icterus. Lymph nodes: No lymphadenopathy noted in the cervical, supraclavicular, or axillary nodes Heart:regular rate and rhythm, without any murmurs or gallops.   Lung: Clear to auscultation without any rhonchi, wheezes or dullness to percussion. Abdomin: Soft, nontender without any shifting dullness or ascites. Musculoskeletal: No clubbing or cyanosis. Neurological: No motor or sensory deficits. Skin: No rashes or lesions.                   Lab Results: Lab Results  Component Value Date   WBC 7.9 04/28/2020   HGB 12.9 04/28/2020   HCT 40.3 04/28/2020   MCV 93.7 04/28/2020   PLT 258 04/28/2020     Chemistry      Component Value Date/Time  NA 141 04/28/2020 0959   NA 138 12/05/2016 0750   K 4.5 04/28/2020 0959   CL 104 04/28/2020 0959   CO2 27 04/28/2020 0959   BUN 11 04/28/2020 0959   BUN 15 12/05/2016 0750   CREATININE 0.69 04/28/2020 0959   CREATININE 0.70 09/05/2015 0828      Component Value Date/Time   CALCIUM 9.4 04/28/2020 0959   ALKPHOS 86 04/28/2020 0959   AST 22 04/28/2020 0959   ALT 22 04/28/2020 0959   BILITOT 0.5 04/28/2020 0959       EXAM: NUCLEAR MEDICINE PET WHOLE BODY  TECHNIQUE: 7.1 mCi F-18 FDG was injected intravenously. Full-ring PET imaging was performed from the skull base to thigh after the radiotracer. CT data was obtained and used for  attenuation correction and anatomic localization.  Fasting blood glucose: 88 mg/dl  COMPARISON:  Multiple prior PET CTs.  The most recent is 02/01/2020  FINDINGS: Mediastinal blood pool activity: SUV max 2.88  HEAD/NECK: No hypermetabolic activity in the scalp. No hypermetabolic cervical lymph nodes.  Incidental CT findings: none  CHEST: Stable surgical changes involving the right breast. Small focus of hypermetabolism adjacent to surgical clips and or calcification. SUV max is 2.81. 16 mm soft tissue density is likely scarring change. No interval change.  No enlarged or hypermetabolic axillary or supraclavicular nodes. No mediastinal or hilar adenopathy. No worrisome pulmonary nodules to suggest pulmonary metastatic disease.  Incidental CT findings: none  ABDOMEN/PELVIS: No findings for abdominal/pelvic metastatic disease. The solid abdominal organs are unremarkable. No mesenteric or retroperitoneal mass or adenopathy.  Stable 5 mm soft tissue nodule in the left groin area located just anterior to the pectineus muscle. SUV max is 4.2 and was previously 5.46.  No perirectal or perianal disease is identified.  Incidental CT findings: none  SKELETON: Focus of hypermetabolism is noted in the L5 spinous process associated mild cortical thickening and sclerosis. Findings suspicious for metastatic focus. SUV max is 3.65. This was previously 3.46.  I do not see any other definite bone lesions.  Incidental CT findings: none  EXTREMITIES: Stable hypermetabolic lesion in the left posterior thigh musculature consistent with a muscle metastasis. SUV max is 9.23 and was previously 8.49.  No other lower extremity disease is identified.  Incidental CT findings: none  IMPRESSION: 1. Overall stable PET-CT as detailed above. 2. No new or progressive findings are identified.     Impression and Plan:  79 year old with:  1.   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   PET CT scan on August 6 of 2021 was personally reviewed and discussed with the patient.  Overall no dramatic changes noted with persistent findings in the left posterior thigh musculature.  There is a focus of hypermetabolism of L5 spinous process that is equivocal.  This was not described on previous reports.  Risks and benefits of continuing immunotherapy treatment were discussed at this time.  Complications include immune mediated complications, infusion related complications and others were reviewed.  He is agreeable to continue at this time.  We will repeat imaging studies next 3 to 4 months with potentially repeat dedicated skeletal imaging if at area and L5 remains of concern.     2.  Immune mediated complications: No issues reported at this time.  I continue to educate her about potential complication occluding pneumonitis, colitis and thyroid disease.   3. Dermatology surveillance: She is up-to-date on her surveillance by dermatology.  4.  Antiemetics: Compazine is available to her without any nausea vomiting.  5.  IV access: No issues reported with peripheral veins at this time.  6.  Breast cancer: No evidence of relapse based on recent imaging studies.  7.  Follow-up: She will return in 3 weeks for repeat follow-up.  30 minutes were spent on this encounter.  The time was dedicated to reviewing imaging studies, discussing differential diagnosis and management options for the future.    Zola Button, MD 8/13/202110:54 AM

## 2020-05-22 ENCOUNTER — Telehealth: Payer: Self-pay | Admitting: Oncology

## 2020-05-22 NOTE — Telephone Encounter (Signed)
Scheduled per 08/13 los, patient has been called and notified.

## 2020-06-09 ENCOUNTER — Inpatient Hospital Stay: Payer: Medicare Other

## 2020-06-09 ENCOUNTER — Inpatient Hospital Stay (HOSPITAL_BASED_OUTPATIENT_CLINIC_OR_DEPARTMENT_OTHER): Payer: Medicare Other | Admitting: Oncology

## 2020-06-09 ENCOUNTER — Other Ambulatory Visit: Payer: Self-pay

## 2020-06-09 ENCOUNTER — Inpatient Hospital Stay: Payer: Medicare Other | Attending: Oncology

## 2020-06-09 VITALS — BP 128/76 | HR 69 | Temp 97.4°F | Resp 18 | Ht 65.0 in | Wt 145.0 lb

## 2020-06-09 DIAGNOSIS — Z5112 Encounter for antineoplastic immunotherapy: Secondary | ICD-10-CM | POA: Diagnosis not present

## 2020-06-09 DIAGNOSIS — C211 Malignant neoplasm of anal canal: Secondary | ICD-10-CM

## 2020-06-09 DIAGNOSIS — E039 Hypothyroidism, unspecified: Secondary | ICD-10-CM | POA: Diagnosis not present

## 2020-06-09 DIAGNOSIS — Z79899 Other long term (current) drug therapy: Secondary | ICD-10-CM | POA: Diagnosis not present

## 2020-06-09 DIAGNOSIS — Z853 Personal history of malignant neoplasm of breast: Secondary | ICD-10-CM | POA: Diagnosis not present

## 2020-06-09 LAB — CMP (CANCER CENTER ONLY)
ALT: 20 U/L (ref 0–44)
AST: 21 U/L (ref 15–41)
Albumin: 3.9 g/dL (ref 3.5–5.0)
Alkaline Phosphatase: 82 U/L (ref 38–126)
Anion gap: 9 (ref 5–15)
BUN: 11 mg/dL (ref 8–23)
CO2: 26 mmol/L (ref 22–32)
Calcium: 9.1 mg/dL (ref 8.9–10.3)
Chloride: 103 mmol/L (ref 98–111)
Creatinine: 0.64 mg/dL (ref 0.44–1.00)
GFR, Est AFR Am: >60 mL/min (ref >60)
GFR, Estimated: >60 mL/min (ref >60)
Glucose, Bld: 87 mg/dL (ref 70–99)
Potassium: 4.4 mmol/L (ref 3.5–5.1)
Sodium: 138 mmol/L (ref 135–145)
Total Bilirubin: 0.5 mg/dL (ref 0.3–1.2)
Total Protein: 6.6 g/dL (ref 6.5–8.1)

## 2020-06-09 LAB — CBC WITH DIFFERENTIAL (CANCER CENTER ONLY)
Abs Immature Granulocytes: 0.05 10*3/uL (ref 0.00–0.07)
Basophils Absolute: 0.1 10*3/uL (ref 0.0–0.1)
Basophils Relative: 1 %
Eosinophils Absolute: 0.9 10*3/uL — ABNORMAL HIGH (ref 0.0–0.5)
Eosinophils Relative: 9 %
HCT: 41.3 % (ref 36.0–46.0)
Hemoglobin: 13.3 g/dL (ref 12.0–15.0)
Immature Granulocytes: 1 %
Lymphocytes Relative: 19 %
Lymphs Abs: 1.8 10*3/uL (ref 0.7–4.0)
MCH: 30.4 pg (ref 26.0–34.0)
MCHC: 32.2 g/dL (ref 30.0–36.0)
MCV: 94.5 fL (ref 80.0–100.0)
Monocytes Absolute: 0.8 10*3/uL (ref 0.1–1.0)
Monocytes Relative: 8 %
Neutro Abs: 5.9 10*3/uL (ref 1.7–7.7)
Neutrophils Relative %: 62 %
Platelet Count: 248 10*3/uL (ref 150–400)
RBC: 4.37 MIL/uL (ref 3.87–5.11)
RDW: 13.8 % (ref 11.5–15.5)
WBC Count: 9.5 10*3/uL (ref 4.0–10.5)
nRBC: 0 % (ref 0.0–0.2)

## 2020-06-09 LAB — TSH: TSH: 1.74 u[IU]/mL (ref 0.308–3.960)

## 2020-06-09 MED ORDER — ACETAMINOPHEN 325 MG PO TABS
ORAL_TABLET | ORAL | Status: AC
  Filled 2020-06-09: qty 2

## 2020-06-09 MED ORDER — SODIUM CHLORIDE 0.9 % IV SOLN
200.0000 mg | Freq: Once | INTRAVENOUS | Status: AC
  Administered 2020-06-09: 200 mg via INTRAVENOUS
  Filled 2020-06-09: qty 8

## 2020-06-09 MED ORDER — DIPHENHYDRAMINE HCL 25 MG PO CAPS
ORAL_CAPSULE | ORAL | Status: AC
  Filled 2020-06-09: qty 2

## 2020-06-09 MED ORDER — SODIUM CHLORIDE 0.9 % IV SOLN
Freq: Once | INTRAVENOUS | Status: AC
  Filled 2020-06-09: qty 250

## 2020-06-09 NOTE — Progress Notes (Signed)
Hematology and Oncology Follow Up Visit  Angela Carpenter [id number] November 14, 1940 79 y.o. 06/09/2020 11:29 AM Tisovec, [name], MDTisovec, [name], MD   Principle Diagnosis: 79 year old woman with mucosal melanoma of the anal canal diagnosed in August 2020.  She was found to have stage IV disease with pelvic nodule.   Prior Therapy:   She is status post perianal excision in August 2020.  Current therapy: Pembrolizumab 200 mg every 3 weeks started on July 29, 2019.  She is here for cycle 16 of therapy.  Interim History: Angela Carpenter is here for a follow-up visit.  Since the last visit, she reports no major changes in her health.  She denies any nausea vomiting abdominal pain.  She denies any excessive fatigue or tiredness.  She denies any skin rash or pruritus.  A formal status quality of life remain excellent.  She denies any skin changes new lesions.         Medications: Unchanged on review. Current Outpatient Medications  Medication Sig Dispense Refill  . acyclovir (ZOVIRAX) 400 MG tablet Take 400 mg by mouth 2 (two) times daily.    Marland Kitchen alendronate (FOSAMAX) 70 MG tablet Take 70 mg by mouth once a week. Take with a full glass of water on an empty stomach.    Marland Kitchen atorvastatin (LIPITOR) 10 MG tablet Take 1 tablet (10 mg total) by mouth daily. 90 tablet 3  . Cholecalciferol (VITAMIN D-3) 125 MCG (5000 UT) TABS Take 5,000 Units by mouth daily.     Marland Kitchen ELIQUIS 5 MG TABS tablet TAKE 1 TABLET TWICE A DAY 180 tablet 1  . metoprolol succinate (TOPROL-XL) 25 MG 24 hr tablet Take 1 tablet (25 mg total) by mouth 2 (two) times daily. 180 tablet 3  . Multiple Vitamins-Minerals (PRESERVISION AREDS 2+MULTI VIT) CAPS Take 1 capsule by mouth 2 (two) times daily.    . raloxifene (EVISTA) 60 MG tablet Take 60 mg by mouth daily.    . timolol (TIMOPTIC) 0.5 % ophthalmic solution Place 1 drop into both eyes 2 (two) times daily.     Current Facility-Administered Medications  Medication Dose Route Frequency  Provider Last Rate Last Admin  . 0.9 %  sodium chloride infusion  500 mL Intravenous Continuous Danis, Estill Cotta III, MD         Allergies: No Known Allergies     Physical Exam:       Blood pressure 128/76, pulse 69, temperature (!) 97.4 F (36.3 C), temperature source Tympanic, resp. rate 18, height 5\' 5"  (1.651 m), weight 145 lb (65.8 kg), SpO2 100 %.         ECOG: 1   General appearance: Comfortable appearing without any discomfort Head: Normocephalic without any trauma Oropharynx: Mucous membranes are moist and pink without any thrush or ulcers. Eyes: Pupils are equal and round reactive to light. Lymph nodes: No cervical, supraclavicular, inguinal or axillary lymphadenopathy.   Heart:regular rate and rhythm.  S1 and S2 without leg edema. Lung: Clear without any rhonchi or wheezes.  No dullness to percussion. Abdomin: Soft, nontender, nondistended with good bowel sounds.  No hepatosplenomegaly. Musculoskeletal: No joint deformity or effusion.  Full range of motion noted. Neurological: No deficits noted on motor, sensory and deep tendon reflex exam. Skin: No petechial rash or dryness.  Appeared moist.                     Lab Results: Lab Results  Component Value Date   WBC 10.5 05/19/2020  HGB 13.3 05/19/2020   HCT 40.8 05/19/2020   MCV 94.7 05/19/2020   PLT 293 05/19/2020     Chemistry      Component Value Date/Time   NA 139 05/19/2020 1042   NA 138 12/05/2016 0750   K 4.1 05/19/2020 1042   CL 104 05/19/2020 1042   CO2 26 05/19/2020 1042   BUN 12 05/19/2020 1042   BUN 15 12/05/2016 0750   CREATININE 0.70 05/19/2020 1042   CREATININE 0.70 09/05/2015 0828      Component Value Date/Time   CALCIUM 9.7 05/19/2020 1042   ALKPHOS 95 05/19/2020 1042   AST 19 05/19/2020 1042   ALT 20 05/19/2020 1042   BILITOT 0.5 05/19/2020 1042        IMPRESSION: 1. Overall stable PET-CT as detailed above. 2. No new or progressive findings  are identified.     Impression and Plan:  79 year old with:  1.   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  She was found to have stage IV disease that is very limited with pelvic nodule.  The natural course of her disease was reviewed.  Risks and benefits of continuing immunotherapy were discussed.  PET CT scan obtained in August 2021 showed stable disease with very little residual tumor noted.  I recommended continuing immunotherapy given her overall reasonable response and tolerance.  Goal of 2 years would be reasonable pending any further toxicities.  She is agreeable to continue.     2.  Immune mediated complications: I continue to educate her about potential complications including pneumonitis, colitis and thyroid disease.   3. Dermatology surveillance: I recommended follow-up with dermatology and she is currently up-to-date.  4.  Antiemetics: No nausea or vomiting reported.  Antiemetics are available to her.  5.  IV access: Peripheral veins are currently in use and does not require a Port-A-Cath.  6.  Breast cancer: Continues to be in remission with PET scan obtained in August did not show any abnormalities.   7.  Covid vaccination considerations: Given her melanoma cancer diagnosis and ongoing cancer treatment she would benefit from vaccination booster.  Risks and benefits of this approach were was discussed and she is agreeable to proceed in the near future.  8.  Follow-up: In 3 weeks for repeat evaluation.    30 minutes were dedicated to this visit.  The time was spent on reviewing her disease status, discussing treatment options and outlining future plan of care.   Zola Button, MD 9/3/202111:29 AM

## 2020-06-16 ENCOUNTER — Inpatient Hospital Stay: Payer: Medicare Other

## 2020-06-16 ENCOUNTER — Other Ambulatory Visit: Payer: Self-pay

## 2020-06-16 ENCOUNTER — Telehealth: Payer: Self-pay | Admitting: Oncology

## 2020-06-16 DIAGNOSIS — Z23 Encounter for immunization: Secondary | ICD-10-CM

## 2020-06-16 NOTE — Telephone Encounter (Signed)
Called patient to confirm upcoming appointments. Patient said she is aware of all upcoming appointments.

## 2020-06-16 NOTE — Progress Notes (Signed)
   Covid-19 Vaccination Clinic  Name:  Angela Carpenter    MRN: [id number] DOB: 08-Dec-1940  06/16/2020  Angela Carpenter was observed post Covid-19 immunization for 15 minutes without incident. She was provided with Vaccine Information Sheet and instruction to access the V-Safe system.   Angela Carpenter was instructed to call 911 with any severe reactions post vaccine: Marland Kitchen Difficulty breathing  . Swelling of face and throat  . A fast heartbeat  . A bad rash all over body  . Dizziness and weakness

## 2020-06-19 DIAGNOSIS — Z85828 Personal history of other malignant neoplasm of skin: Secondary | ICD-10-CM | POA: Diagnosis not present

## 2020-06-19 DIAGNOSIS — L905 Scar conditions and fibrosis of skin: Secondary | ICD-10-CM | POA: Diagnosis not present

## 2020-06-19 DIAGNOSIS — L814 Other melanin hyperpigmentation: Secondary | ICD-10-CM | POA: Diagnosis not present

## 2020-06-19 DIAGNOSIS — D485 Neoplasm of uncertain behavior of skin: Secondary | ICD-10-CM | POA: Diagnosis not present

## 2020-06-19 DIAGNOSIS — L821 Other seborrheic keratosis: Secondary | ICD-10-CM | POA: Diagnosis not present

## 2020-06-19 DIAGNOSIS — C44519 Basal cell carcinoma of skin of other part of trunk: Secondary | ICD-10-CM | POA: Diagnosis not present

## 2020-06-19 DIAGNOSIS — Z85820 Personal history of malignant melanoma of skin: Secondary | ICD-10-CM | POA: Diagnosis not present

## 2020-06-19 DIAGNOSIS — D1801 Hemangioma of skin and subcutaneous tissue: Secondary | ICD-10-CM | POA: Diagnosis not present

## 2020-06-28 NOTE — Progress Notes (Signed)
[facility] OFFICE PROGRESS NOTE  Tisovec, [name], MD [place] [address]  DIAGNOSIS: 79 year old woman with mucosal melanoma of the anal canal diagnosed in August 2020.  She was found to have stage IV disease with pelvic nodule.  PRIOR THERAPY: She is status post perianal excision in August 2020.  CURRENT THERAPY:  Pembrolizumab 200 mg every 3 weeks started on July 29, 2019.  She is here for cycle 17 of therapy.  INTERVAL HISTORY: Angela Carpenter 79 y.o. female returns to the clinic for a follow up visit. The patient is feeling well today without any concerning complaints. She received her COVID-19 booster vaccine 2 weeks ago. She also saw her dermatologist for a routine skin check. The patient continues to tolerate treatment with immunotherapy with Keytruda well without any adverse effects. Denies any fever, chills, night sweats, or weight loss. Denies any chest pain, shortness of breath, cough, or hemoptysis. Denies any nausea, vomiting, diarrhea, or constipation. Denies any headache or visual changes. Denies any rashes or skin changes. The patient is here today for evaluation prior to starting cycle # 17   MEDICAL HISTORY: Past Medical History:  Diagnosis Date  . A-fib ([place])   . Atrial fibrillation ([place])    4 years ago started  . Breast cancer ([facility]) 2006  . Colon polyps   . Glaucoma   . Hypercholesteremia   . Hypertension    patient denies   . Osteoporosis   . Personal history of chemotherapy 2006  . Personal history of radiation therapy 2006  . Stroke (cerebrum) ([facility]) 2015    ALLERGIES:  has No Known Allergies.  MEDICATIONS:  Current Outpatient Medications  Medication Sig Dispense Refill  . acyclovir (ZOVIRAX) 400 MG tablet Take 400 mg by mouth 2 (two) times daily.    Marland Kitchen alendronate (FOSAMAX) 70 MG tablet Take 70 mg by mouth once a week. Take with a full glass of water on an empty stomach.    Marland Kitchen atorvastatin (LIPITOR) 10 MG tablet Take  1 tablet (10 mg total) by mouth daily. 90 tablet 3  . Cholecalciferol (VITAMIN D-3) 125 MCG (5000 UT) TABS Take 5,000 Units by mouth daily.     Marland Kitchen ELIQUIS 5 MG TABS tablet TAKE 1 TABLET TWICE A DAY 180 tablet 1  . metoprolol succinate (TOPROL-XL) 25 MG 24 hr tablet Take 1 tablet (25 mg total) by mouth 2 (two) times daily. 180 tablet 3  . Multiple Vitamins-Minerals (PRESERVISION AREDS 2+MULTI VIT) CAPS Take 1 capsule by mouth 2 (two) times daily.    . raloxifene (EVISTA) 60 MG tablet Take 60 mg by mouth daily.    . timolol (TIMOPTIC) 0.5 % ophthalmic solution Place 1 drop into both eyes 2 (two) times daily.     Current Facility-Administered Medications  Medication Dose Route Frequency Provider Last Rate Last Admin  . 0.9 %  sodium chloride infusion  500 mL Intravenous Continuous Doran Stabler, MD        SURGICAL HISTORY:  Past Surgical History:  Procedure Laterality Date  . AUGMENTATION MAMMAPLASTY Bilateral 2006   Patient had them removed in 2008  . BREAST BIOPSY Left 2016  . BREAST LUMPECTOMY Right 2006  . COLONOSCOPY     had polyps  . EYE SURGERY Right    cataract  . MELANOMA EXCISION WITH SENTINEL LYMPH NODE BIOPSY N/A 06/02/2019   Procedure: Left lateral ANAL MELANOMA EXCISION WITH Bilateral Groin exploration and SENTINEL node mapping;  Surgeon: [name],  MD;  Location: [facility];  Service: General;  Laterality: N/A;  . TONSILLECTOMY      REVIEW OF SYSTEMS:   Review of Systems  Constitutional: Negative for appetite change, chills, fatigue, fever and unexpected weight change.  HENT: Negative for mouth sores, nosebleeds, sore throat and trouble swallowing.   Eyes: Negative for eye problems and icterus.  Respiratory: Negative for cough, hemoptysis, shortness of breath and wheezing.   Cardiovascular: Negative for chest pain and leg swelling.  Gastrointestinal: Negative for abdominal pain, constipation, diarrhea, nausea and vomiting.  Genitourinary: Negative for bladder  incontinence, difficulty urinating, dysuria, frequency and hematuria.   Musculoskeletal: Negative for back pain, gait problem, neck pain and neck stiffness.  Skin: Negative for itching and rash.  Neurological: Negative for dizziness, extremity weakness, gait problem, headaches, light-headedness and seizures.  Hematological: Negative for adenopathy. Does not bruise/bleed easily.  Psychiatric/Behavioral: Negative for confusion, depression and sleep disturbance. The patient is not nervous/anxious.     PHYSICAL EXAMINATION:  Blood pressure 136/64, pulse 71, temperature (!) 97.5 F (36.4 C), temperature source Tympanic, resp. rate 18, height 5\' 5"  (1.651 m), weight 143 lb 1.6 oz (64.9 kg), SpO2 100 %.  ECOG PERFORMANCE STATUS: 1 - Symptomatic but completely ambulatory  Physical Exam  Constitutional: Oriented to person, place, and time and well-developed, well-nourished, and in no distress.  HENT:  Head: Normocephalic and atraumatic.  Mouth/Throat: Oropharynx is clear and moist. No oropharyngeal exudate.  Eyes: Conjunctivae are normal. Right eye exhibits no discharge. Left eye exhibits no discharge. No scleral icterus.  Neck: Normal range of motion. Neck supple.  Cardiovascular: Normal rate, regular rhythm, normal heart sounds and intact distal pulses.   Pulmonary/Chest: Effort normal and breath sounds normal. No respiratory distress. No wheezes. No rales.  Abdominal: Soft. Bowel sounds are normal. Exhibits no distension and no mass. There is no tenderness.  Musculoskeletal: Normal range of motion. Exhibits no edema.  Lymphadenopathy:    No cervical adenopathy.  Neurological: Alert and oriented to person, place, and time. Exhibits normal muscle tone. Gait normal. Coordination normal.  Skin: Skin is warm and dry. No rash noted. Not diaphoretic. No erythema. No pallor.  Psychiatric: Mood, memory and judgment normal.  Vitals reviewed.  LABORATORY DATA: Lab Results  Component Value Date    WBC 8.2 06/30/2020   HGB 14.3 06/30/2020   HCT 43.3 06/30/2020   MCV 92.7 06/30/2020   PLT 286 06/30/2020      Chemistry      Component Value Date/Time   NA 140 06/30/2020 0840   NA 138 12/05/2016 0750   K 4.2 06/30/2020 0840   CL 103 06/30/2020 0840   CO2 30 06/30/2020 0840   BUN 11 06/30/2020 0840   BUN 15 12/05/2016 0750   CREATININE 0.71 06/30/2020 0840   CREATININE 0.70 09/05/2015 0828      Component Value Date/Time   CALCIUM 9.4 06/30/2020 0840   ALKPHOS 87 06/30/2020 0840   AST 22 06/30/2020 0840   ALT 25 06/30/2020 0840   BILITOT 0.5 06/30/2020 0840       RADIOGRAPHIC STUDIES:  No results found.   ASSESSMENT/PLAN:  79 year old with:  1.  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  She was found to have stage IV disease that is very limited with pelvic nodule.  Recommended continuing immunotherapy given her overall reasonable response and tolerance.  Goal of 2 years would be reasonable pending any further toxicities.  She is agreeable to continue. She will proceed  with cycle #17 today as scheduled.   2. Immune mediated complications: We continue to educate her about potential complications including pneumonitis, colitis and thyroid disease.  3. Dermatology surveillance: The patient knows that it is recommended follow-up with dermatology and she is currently up-to-date.  4. Antiemetics: No nausea or vomiting reported.  Antiemetics are available to her.  5.  IV access: Peripheral veins are currently in use and does not require a Port-A-Cath.  6.  Breast cancer: Continues to be in remission with PET scan obtained in August did not show any abnormalities.   7. Follow-up: In 3 weeks for repeat evaluation.     No orders of the defined types were placed in this encounter.    Jenisse Vullo L Shanyn Preisler, PA-C 06/30/20

## 2020-06-30 ENCOUNTER — Other Ambulatory Visit: Payer: Self-pay

## 2020-06-30 ENCOUNTER — Inpatient Hospital Stay (HOSPITAL_BASED_OUTPATIENT_CLINIC_OR_DEPARTMENT_OTHER): Payer: Medicare Other | Admitting: Physician Assistant

## 2020-06-30 ENCOUNTER — Inpatient Hospital Stay: Payer: Medicare Other

## 2020-06-30 VITALS — BP 136/64 | HR 71 | Temp 97.5°F | Resp 18 | Ht 65.0 in | Wt 143.1 lb

## 2020-06-30 DIAGNOSIS — C211 Malignant neoplasm of anal canal: Secondary | ICD-10-CM

## 2020-06-30 DIAGNOSIS — Z853 Personal history of malignant neoplasm of breast: Secondary | ICD-10-CM | POA: Diagnosis not present

## 2020-06-30 DIAGNOSIS — Z5112 Encounter for antineoplastic immunotherapy: Secondary | ICD-10-CM | POA: Diagnosis not present

## 2020-06-30 DIAGNOSIS — Z79899 Other long term (current) drug therapy: Secondary | ICD-10-CM | POA: Diagnosis not present

## 2020-06-30 DIAGNOSIS — E039 Hypothyroidism, unspecified: Secondary | ICD-10-CM | POA: Diagnosis not present

## 2020-06-30 LAB — CBC WITH DIFFERENTIAL (CANCER CENTER ONLY)
Abs Immature Granulocytes: 0.07 10*3/uL (ref 0.00–0.07)
Basophils Absolute: 0.1 10*3/uL (ref 0.0–0.1)
Basophils Relative: 1 %
Eosinophils Absolute: 0.8 10*3/uL — ABNORMAL HIGH (ref 0.0–0.5)
Eosinophils Relative: 10 %
HCT: 43.3 % (ref 36.0–46.0)
Hemoglobin: 14.3 g/dL (ref 12.0–15.0)
Immature Granulocytes: 1 %
Lymphocytes Relative: 21 %
Lymphs Abs: 1.7 10*3/uL (ref 0.7–4.0)
MCH: 30.6 pg (ref 26.0–34.0)
MCHC: 33 g/dL (ref 30.0–36.0)
MCV: 92.7 fL (ref 80.0–100.0)
Monocytes Absolute: 0.7 10*3/uL (ref 0.1–1.0)
Monocytes Relative: 9 %
Neutro Abs: 4.8 10*3/uL (ref 1.7–7.7)
Neutrophils Relative %: 58 %
Platelet Count: 286 10*3/uL (ref 150–400)
RBC: 4.67 MIL/uL (ref 3.87–5.11)
RDW: 13.7 % (ref 11.5–15.5)
WBC Count: 8.2 10*3/uL (ref 4.0–10.5)
nRBC: 0 % (ref 0.0–0.2)

## 2020-06-30 LAB — CMP (CANCER CENTER ONLY)
ALT: 25 U/L (ref 0–44)
AST: 22 U/L (ref 15–41)
Albumin: 3.9 g/dL (ref 3.5–5.0)
Alkaline Phosphatase: 87 U/L (ref 38–126)
Anion gap: 7 (ref 5–15)
BUN: 11 mg/dL (ref 8–23)
CO2: 30 mmol/L (ref 22–32)
Calcium: 9.4 mg/dL (ref 8.9–10.3)
Chloride: 103 mmol/L (ref 98–111)
Creatinine: 0.71 mg/dL (ref 0.44–1.00)
GFR, Est AFR Am: >60 mL/min (ref >60)
GFR, Estimated: >60 mL/min (ref >60)
Glucose, Bld: 104 mg/dL — ABNORMAL HIGH (ref 70–99)
Potassium: 4.2 mmol/L (ref 3.5–5.1)
Sodium: 140 mmol/L (ref 135–145)
Total Bilirubin: 0.5 mg/dL (ref 0.3–1.2)
Total Protein: 6.9 g/dL (ref 6.5–8.1)

## 2020-06-30 MED ORDER — SODIUM CHLORIDE 0.9 % IV SOLN
200.0000 mg | Freq: Once | INTRAVENOUS | Status: AC
  Administered 2020-06-30: 200 mg via INTRAVENOUS
  Filled 2020-06-30: qty 8

## 2020-06-30 MED ORDER — SODIUM CHLORIDE 0.9 % IV SOLN
Freq: Once | INTRAVENOUS | Status: AC
  Filled 2020-06-30: qty 250

## 2020-06-30 NOTE — Patient Instructions (Signed)
[facility] Discharge Instructions for Patients Receiving Chemotherapy  Today you received the following chemotherapy agents: pembrolizumab Beryle Flock).  To help prevent nausea and vomiting after your treatment, we encourage you to take your nausea medication as directed.   If you develop nausea and vomiting that is not controlled by your nausea medication, call the clinic.   BELOW ARE SYMPTOMS THAT SHOULD BE REPORTED IMMEDIATELY:  *FEVER GREATER THAN 100.5 F  *CHILLS WITH OR WITHOUT FEVER  NAUSEA AND VOMITING THAT IS NOT CONTROLLED WITH YOUR NAUSEA MEDICATION  *UNUSUAL SHORTNESS OF BREATH  *UNUSUAL BRUISING OR BLEEDING  TENDERNESS IN MOUTH AND THROAT WITH OR WITHOUT PRESENCE OF ULCERS  *URINARY PROBLEMS  *BOWEL PROBLEMS  UNUSUAL RASH Items with * indicate a potential emergency and should be followed up as soon as possible.  Feel free to call the clinic should you have any questions or concerns. The clinic phone number is [phone].  Please show the [facility] at check-in to the Emergency Department and triage nurse.

## 2020-07-15 DIAGNOSIS — Z23 Encounter for immunization: Secondary | ICD-10-CM | POA: Diagnosis not present

## 2020-07-21 ENCOUNTER — Inpatient Hospital Stay: Payer: Medicare Other

## 2020-07-21 ENCOUNTER — Inpatient Hospital Stay: Payer: Medicare Other | Attending: Oncology

## 2020-07-21 ENCOUNTER — Inpatient Hospital Stay (HOSPITAL_BASED_OUTPATIENT_CLINIC_OR_DEPARTMENT_OTHER): Payer: Medicare Other | Admitting: Oncology

## 2020-07-21 ENCOUNTER — Other Ambulatory Visit: Payer: Self-pay

## 2020-07-21 VITALS — BP 129/60 | HR 77 | Temp 98.1°F | Resp 17 | Wt 147.3 lb

## 2020-07-21 DIAGNOSIS — Z5112 Encounter for antineoplastic immunotherapy: Secondary | ICD-10-CM | POA: Diagnosis not present

## 2020-07-21 DIAGNOSIS — C211 Malignant neoplasm of anal canal: Secondary | ICD-10-CM | POA: Diagnosis not present

## 2020-07-21 DIAGNOSIS — E039 Hypothyroidism, unspecified: Secondary | ICD-10-CM

## 2020-07-21 DIAGNOSIS — Z853 Personal history of malignant neoplasm of breast: Secondary | ICD-10-CM | POA: Diagnosis not present

## 2020-07-21 DIAGNOSIS — Z79899 Other long term (current) drug therapy: Secondary | ICD-10-CM | POA: Diagnosis not present

## 2020-07-21 LAB — CMP (CANCER CENTER ONLY)
ALT: 24 U/L (ref 0–44)
AST: 23 U/L (ref 15–41)
Albumin: 4 g/dL (ref 3.5–5.0)
Alkaline Phosphatase: 80 U/L (ref 38–126)
Anion gap: 5 (ref 5–15)
BUN: 11 mg/dL (ref 8–23)
CO2: 29 mmol/L (ref 22–32)
Calcium: 9.4 mg/dL (ref 8.9–10.3)
Chloride: 101 mmol/L (ref 98–111)
Creatinine: 0.67 mg/dL (ref 0.44–1.00)
GFR, Estimated: >60 mL/min (ref >60)
Glucose, Bld: 100 mg/dL — ABNORMAL HIGH (ref 70–99)
Potassium: 4.5 mmol/L (ref 3.5–5.1)
Sodium: 135 mmol/L (ref 135–145)
Total Bilirubin: 0.5 mg/dL (ref 0.3–1.2)
Total Protein: 6.9 g/dL (ref 6.5–8.1)

## 2020-07-21 LAB — CBC WITH DIFFERENTIAL (CANCER CENTER ONLY)
Abs Immature Granulocytes: 0.05 10*3/uL (ref 0.00–0.07)
Basophils Absolute: 0.1 10*3/uL (ref 0.0–0.1)
Basophils Relative: 1 %
Eosinophils Absolute: 0.8 10*3/uL — ABNORMAL HIGH (ref 0.0–0.5)
Eosinophils Relative: 8 %
HCT: 45.1 % (ref 36.0–46.0)
Hemoglobin: 14.4 g/dL (ref 12.0–15.0)
Immature Granulocytes: 1 %
Lymphocytes Relative: 20 %
Lymphs Abs: 1.9 10*3/uL (ref 0.7–4.0)
MCH: 30.4 pg (ref 26.0–34.0)
MCHC: 31.9 g/dL (ref 30.0–36.0)
MCV: 95.3 fL (ref 80.0–100.0)
Monocytes Absolute: 0.7 10*3/uL (ref 0.1–1.0)
Monocytes Relative: 7 %
Neutro Abs: 6.2 10*3/uL (ref 1.7–7.7)
Neutrophils Relative %: 63 %
Platelet Count: 254 10*3/uL (ref 150–400)
RBC: 4.73 MIL/uL (ref 3.87–5.11)
RDW: 13.8 % (ref 11.5–15.5)
WBC Count: 9.7 10*3/uL (ref 4.0–10.5)
nRBC: 0 % (ref 0.0–0.2)

## 2020-07-21 LAB — TSH: TSH: 1.716 u[IU]/mL (ref 0.308–3.960)

## 2020-07-21 MED ORDER — SODIUM CHLORIDE 0.9 % IV SOLN
200.0000 mg | Freq: Once | INTRAVENOUS | Status: AC
  Administered 2020-07-21: 200 mg via INTRAVENOUS
  Filled 2020-07-21: qty 8

## 2020-07-21 MED ORDER — SODIUM CHLORIDE 0.9 % IV SOLN
Freq: Once | INTRAVENOUS | Status: AC
  Filled 2020-07-21: qty 250

## 2020-07-21 NOTE — Progress Notes (Signed)
Hematology and Oncology Follow Up Visit  Angela Carpenter [id number] 06/21/1941 79 y.o. 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  She was found to have pelvic nodule at the time of diagnosis.   Prior Therapy:   She is status post perianal excision in August 2020.  Current therapy: Pembrolizumab 200 mg every 3 weeks started on July 29, 2019.  She is here for cycle 18 of therapy.  Interim History: Angela Carpenter returns today for repeat evaluation.  Since the last visit, she reports no complaints.  She tolerated the last cycle of therapy without any new symptoms.  She denies excessive fatigue tiredness.  She denies any skin rash or pruritus.  She denies any pulmonary complaints including no cough, dyspnea exertion or short breath.        Medications: Unchanged on review. Current Outpatient Medications  Medication Sig Dispense Refill  . acyclovir (ZOVIRAX) 400 MG tablet Take 400 mg by mouth 2 (two) times daily.    Marland Kitchen alendronate (FOSAMAX) 70 MG tablet Take 70 mg by mouth once a week. Take with a full glass of water on an empty stomach.    Marland Kitchen atorvastatin (LIPITOR) 10 MG tablet Take 1 tablet (10 mg total) by mouth daily. 90 tablet 3  . Cholecalciferol (VITAMIN D-3) 125 MCG (5000 UT) TABS Take 5,000 Units by mouth daily.     Marland Kitchen ELIQUIS 5 MG TABS tablet TAKE 1 TABLET TWICE A DAY 180 tablet 1  . metoprolol succinate (TOPROL-XL) 25 MG 24 hr tablet Take 1 tablet (25 mg total) by mouth 2 (two) times daily. 180 tablet 3  . Multiple Vitamins-Minerals (PRESERVISION AREDS 2+MULTI VIT) CAPS Take 1 capsule by mouth 2 (two) times daily.    . raloxifene (EVISTA) 60 MG tablet Take 60 mg by mouth daily.    . timolol (TIMOPTIC) 0.5 % ophthalmic solution Place 1 drop into both eyes 2 (two) times daily.     Current Facility-Administered Medications   Medication Dose Route Frequency Provider Last Rate Last Admin  . 0.9 %  sodium chloride infusion  500 mL Intravenous Continuous Danis, Estill Cotta III, MD         Allergies: No Known Allergies     Physical Exam:         Blood pressure 129/60, pulse 77, temperature 98.1 F (36.7 C), temperature source Tympanic, resp. rate 17, weight 147 lb 4.8 oz (66.8 kg), SpO2 100 %.        ECOG: 1   General appearance: Alert, awake without any distress. Head: Atraumatic without abnormalities Oropharynx: Without any thrush or ulcers. Eyes: No scleral icterus. Lymph nodes: No lymphadenopathy noted in the cervical, supraclavicular, or axillary nodes Heart:regular rate and rhythm, without any murmurs or gallops.   Lung: Clear to auscultation without any rhonchi, wheezes or dullness to percussion. Abdomin: Soft, nontender without any shifting dullness or ascites. Musculoskeletal: No clubbing or cyanosis. Neurological: No motor or sensory deficits. Skin: No rashes or lesions.                     Lab Results: Lab Results  Component Value Date   WBC 8.2 06/30/2020   HGB 14.3 06/30/2020   HCT 43.3 06/30/2020   MCV 92.7 06/30/2020   PLT 286 06/30/2020     Chemistry      Component Value Date/Time  NA 140 06/30/2020 0840   NA 138 12/05/2016 0750   K 4.2 06/30/2020 0840   CL 103 06/30/2020 0840   CO2 30 06/30/2020 0840   BUN 11 06/30/2020 0840   BUN 15 12/05/2016 0750   CREATININE 0.71 06/30/2020 0840   CREATININE 0.70 09/05/2015 0828      Component Value Date/Time   CALCIUM 9.4 06/30/2020 0840   ALKPHOS 87 06/30/2020 0840   AST 22 06/30/2020 0840   ALT 25 06/30/2020 0840   BILITOT 0.5 06/30/2020 0840           Impression and Plan:  79 year old with:  1.   Stage IV mucosal melanoma of the anal canal presented with a pelvic nodule diagnosed in August 2020.  Marland Kitchen  She continues to tolerate therapy with Pembrolizumab without any major  complications at this time.  Risks and benefits of continuing this treatment long-term were reviewed.  Imaging studies from August 2021 were reiterated and showed overall stable disease with very little residual tumor.  Complication associated with Pembrolizumab long-term were reviewed which include pruritus and immune mediated issues.  She is agreeable to continue at this time I will repeat imaging studies every 6 months.     2.  Immune mediated complications: She has not experienced any complications including pneumonitis, colitis and thyroid disease.   3. Dermatology surveillance: Continues to be on active surveillance from dermatology which I recommended continuing for the time being.  4.  Antiemetics: Compazine is available to her without any recent nausea or vomiting.  5.  IV access: No issues reported with her peripheral veins.  No need for Port-A-Cath at this time.  6.  Breast cancer: No evidence of relapsed disease and she is up-to-date on her mammography.  7.  Covid vaccination considerations:   8.  Follow-up: She will return in 3 weeks for repeat follow-up and next cycle of therapy.  30 minutes were spent on this encounter.  The time was dedicated to reviewing her disease status, discussing treatment options and future plan of care review.Angela Button, MD 10/15/202112:30 PM

## 2020-07-21 NOTE — Patient Instructions (Signed)
[facility] Discharge Instructions for Patients Receiving Chemotherapy  Today you received the following chemotherapy agent: pembrolizumab (keytruda).  To help prevent nausea and vomiting after your treatment, we encourage you to take your nausea medication as directed.   If you develop nausea and vomiting that is not controlled by your nausea medication, call the clinic.   BELOW ARE SYMPTOMS THAT SHOULD BE REPORTED IMMEDIATELY:  *FEVER GREATER THAN 100.5 F  *CHILLS WITH OR WITHOUT FEVER  NAUSEA AND VOMITING THAT IS NOT CONTROLLED WITH YOUR NAUSEA MEDICATION  *UNUSUAL SHORTNESS OF BREATH  *UNUSUAL BRUISING OR BLEEDING  TENDERNESS IN MOUTH AND THROAT WITH OR WITHOUT PRESENCE OF ULCERS  *URINARY PROBLEMS  *BOWEL PROBLEMS  UNUSUAL RASH Items with * indicate a potential emergency and should be followed up as soon as possible.  Feel free to call the clinic should you have any questions or concerns. The clinic phone number is [phone].  Please show the CHEMO ALERT CARD at check-in to the Emergency Department and triage nurse.   

## 2020-08-10 ENCOUNTER — Telehealth: Payer: Self-pay | Admitting: Oncology

## 2020-08-10 NOTE — Telephone Encounter (Signed)
Scheduled per los, patient has been called and voicemail was left. 

## 2020-08-11 ENCOUNTER — Inpatient Hospital Stay: Payer: Medicare Other | Attending: Oncology

## 2020-08-11 ENCOUNTER — Inpatient Hospital Stay: Payer: Medicare Other

## 2020-08-11 ENCOUNTER — Inpatient Hospital Stay (HOSPITAL_BASED_OUTPATIENT_CLINIC_OR_DEPARTMENT_OTHER): Payer: Medicare Other | Admitting: Oncology

## 2020-08-11 ENCOUNTER — Other Ambulatory Visit: Payer: Self-pay

## 2020-08-11 VITALS — Temp 98.2°F

## 2020-08-11 VITALS — BP 140/71 | HR 71 | Temp 96.6°F | Resp 18 | Wt 147.9 lb

## 2020-08-11 DIAGNOSIS — Z5112 Encounter for antineoplastic immunotherapy: Secondary | ICD-10-CM | POA: Diagnosis not present

## 2020-08-11 DIAGNOSIS — Z79899 Other long term (current) drug therapy: Secondary | ICD-10-CM | POA: Insufficient documentation

## 2020-08-11 DIAGNOSIS — Z853 Personal history of malignant neoplasm of breast: Secondary | ICD-10-CM | POA: Insufficient documentation

## 2020-08-11 DIAGNOSIS — C211 Malignant neoplasm of anal canal: Secondary | ICD-10-CM | POA: Diagnosis not present

## 2020-08-11 LAB — CBC WITH DIFFERENTIAL (CANCER CENTER ONLY)
Abs Immature Granulocytes: 0.03 10*3/uL (ref 0.00–0.07)
Basophils Absolute: 0.1 10*3/uL (ref 0.0–0.1)
Basophils Relative: 1 %
Eosinophils Absolute: 0.7 10*3/uL — ABNORMAL HIGH (ref 0.0–0.5)
Eosinophils Relative: 9 %
HCT: 41.3 % (ref 36.0–46.0)
Hemoglobin: 13.5 g/dL (ref 12.0–15.0)
Immature Granulocytes: 0 %
Lymphocytes Relative: 16 %
Lymphs Abs: 1.4 10*3/uL (ref 0.7–4.0)
MCH: 30.5 pg (ref 26.0–34.0)
MCHC: 32.7 g/dL (ref 30.0–36.0)
MCV: 93.4 fL (ref 80.0–100.0)
Monocytes Absolute: 0.8 10*3/uL (ref 0.1–1.0)
Monocytes Relative: 9 %
Neutro Abs: 5.4 10*3/uL (ref 1.7–7.7)
Neutrophils Relative %: 65 %
Platelet Count: 278 10*3/uL (ref 150–400)
RBC: 4.42 MIL/uL (ref 3.87–5.11)
RDW: 13.6 % (ref 11.5–15.5)
WBC Count: 8.3 10*3/uL (ref 4.0–10.5)
nRBC: 0 % (ref 0.0–0.2)

## 2020-08-11 LAB — CMP (CANCER CENTER ONLY)
ALT: 21 U/L (ref 0–44)
AST: 21 U/L (ref 15–41)
Albumin: 3.8 g/dL (ref 3.5–5.0)
Alkaline Phosphatase: 71 U/L (ref 38–126)
Anion gap: 6 (ref 5–15)
BUN: 12 mg/dL (ref 8–23)
CO2: 29 mmol/L (ref 22–32)
Calcium: 9.1 mg/dL (ref 8.9–10.3)
Chloride: 104 mmol/L (ref 98–111)
Creatinine: 0.51 mg/dL (ref 0.44–1.00)
GFR, Estimated: >60 mL/min (ref >60)
Glucose, Bld: 87 mg/dL (ref 70–99)
Potassium: 4.2 mmol/L (ref 3.5–5.1)
Sodium: 139 mmol/L (ref 135–145)
Total Bilirubin: 0.4 mg/dL (ref 0.3–1.2)
Total Protein: 6.6 g/dL (ref 6.5–8.1)

## 2020-08-11 MED ORDER — SODIUM CHLORIDE 0.9 % IV SOLN
Freq: Once | INTRAVENOUS | Status: AC
  Filled 2020-08-11: qty 250

## 2020-08-11 MED ORDER — SODIUM CHLORIDE 0.9 % IV SOLN
200.0000 mg | Freq: Once | INTRAVENOUS | Status: AC
  Administered 2020-08-11: 200 mg via INTRAVENOUS
  Filled 2020-08-11: qty 8

## 2020-08-11 NOTE — Patient Instructions (Signed)
[facility] Discharge Instructions for Patients Receiving Chemotherapy  Today you received the following chemotherapy agent: pembrolizumab Beryle Flock).  To help prevent nausea and vomiting after your treatment, we encourage you to take your nausea medication as directed.   If you develop nausea and vomiting that is not controlled by your nausea medication, call the clinic.   BELOW ARE SYMPTOMS THAT SHOULD BE REPORTED IMMEDIATELY:  *FEVER GREATER THAN 100.5 F  *CHILLS WITH OR WITHOUT FEVER  NAUSEA AND VOMITING THAT IS NOT CONTROLLED WITH YOUR NAUSEA MEDICATION  *UNUSUAL SHORTNESS OF BREATH  *UNUSUAL BRUISING OR BLEEDING  TENDERNESS IN MOUTH AND THROAT WITH OR WITHOUT PRESENCE OF ULCERS  *URINARY PROBLEMS  *BOWEL PROBLEMS  UNUSUAL RASH Items with * indicate a potential emergency and should be followed up as soon as possible.  Feel free to call the clinic should you have any questions or concerns. The clinic phone number is [phone].  Please show the [facility] at check-in to the Emergency Department and triage nurse.

## 2020-08-11 NOTE — Progress Notes (Signed)
Hematology and Oncology Follow Up Visit  Angela Carpenter [id number] Feb 24, 1941 79 y.o. 08/11/2020 9:48 AM Tisovec, [name], MDTisovec, [name], MD   Principle Diagnosis: 79 year old woman with mucosal melanoma arising from of the anal canal presented with pelvic nodule indicating stage IV disease  in August 2020.     Prior Therapy:   She is status post perianal excision in August 2020.  Current therapy: Pembrolizumab 200 mg every 3 weeks started on July 29, 2019.  She is here for cycle 19 of therapy.  Interim History: Angela Carpenter presents today for a return evaluation.  Since last visit, she reports no major changes in her health. She continues to tolerate Pembrolizumab without any complaints. She denies any nausea, vomiting or abdominal pain. She denies any new skin rashes or lesions. She denies any hematochezia or melena.       Medications: Updated on review. Current Outpatient Medications  Medication Sig Dispense Refill  . acyclovir (ZOVIRAX) 400 MG tablet Take 400 mg by mouth 2 (two) times daily.    Marland Kitchen alendronate (FOSAMAX) 70 MG tablet Take 70 mg by mouth once a week. Take with a full glass of water on an empty stomach.    Marland Kitchen atorvastatin (LIPITOR) 10 MG tablet Take 1 tablet (10 mg total) by mouth daily. 90 tablet 3  . Cholecalciferol (VITAMIN D-3) 125 MCG (5000 UT) TABS Take 5,000 Units by mouth daily.     Marland Kitchen ELIQUIS 5 MG TABS tablet TAKE 1 TABLET TWICE A DAY 180 tablet 1  . metoprolol succinate (TOPROL-XL) 25 MG 24 hr tablet Take 1 tablet (25 mg total) by mouth 2 (two) times daily. 180 tablet 3  . Multiple Vitamins-Minerals (PRESERVISION AREDS 2+MULTI VIT) CAPS Take 1 capsule by mouth 2 (two) times daily.    . raloxifene (EVISTA) 60 MG tablet Take 60 mg by mouth daily.    . timolol (TIMOPTIC) 0.5 % ophthalmic solution Place 1 drop into both eyes 2 (two) times daily.     Current Facility-Administered Medications  Medication Dose Route Frequency Provider Last Rate Last Admin   . 0.9 %  sodium chloride infusion  500 mL Intravenous Continuous Danis, Estill Cotta III, MD         Allergies: No Known Allergies     Physical Exam:         Blood pressure 140/71, pulse 71, temperature (!) 96.6 F (35.9 C), temperature source Tympanic, resp. rate 18, weight 147 lb 14.4 oz (67.1 kg), SpO2 99 %.        ECOG: 1    General appearance: Comfortable appearing without any discomfort Head: Normocephalic without any trauma Oropharynx: Mucous membranes are moist and pink without any thrush or ulcers. Eyes: Pupils are equal and round reactive to light. Lymph nodes: No cervical, supraclavicular, inguinal or axillary lymphadenopathy.   Heart:regular rate and rhythm.  S1 and S2 without leg edema. Lung: Clear without any rhonchi or wheezes.  No dullness to percussion. Abdomin: Soft, nontender, nondistended with good bowel sounds.  No hepatosplenomegaly. Musculoskeletal: No joint deformity or effusion.  Full range of motion noted. Neurological: No deficits noted on motor, sensory and deep tendon reflex exam. Skin: No petechial rash or dryness.  Appeared moist.                       Lab Results: Lab Results  Component Value Date   WBC 9.7 07/21/2020   HGB 14.4 07/21/2020   HCT 45.1 07/21/2020   MCV  95.3 07/21/2020   PLT 254 07/21/2020     Chemistry      Component Value Date/Time   NA 135 07/21/2020 1237   NA 138 12/05/2016 0750   K 4.5 07/21/2020 1237   CL 101 07/21/2020 1237   CO2 29 07/21/2020 1237   BUN 11 07/21/2020 1237   BUN 15 12/05/2016 0750   CREATININE 0.67 07/21/2020 1237   CREATININE 0.70 09/05/2015 0828      Component Value Date/Time   CALCIUM 9.4 07/21/2020 1237   ALKPHOS 80 07/21/2020 1237   AST 23 07/21/2020 1237   ALT 24 07/21/2020 1237   BILITOT 0.5 07/21/2020 1237           Impression and Plan:  79 year old with:  1.   Melanoma of the anal canal diagnosed in August 2020.  He was found to have  stage IV disease arising from mucosal lining and pelvic nodule.    She is currently on Pembrolizumab with excellent response and tolerance.  Imaging studies from August 2021 continues to show positive response to therapy.  Risks and benefits of continuing this therapy were reviewed.  Issues such as dermatitis, autoimmune complications as well as GI toxicity were reviewed. She is agreeable to continue and will update imaging studies in February 2022.   2.  Immune mediated complications: Pneumonitis, colitis and thyroid disease were reiterated and will continue to monitor at this time.   3. Dermatology surveillance: No evidence of any other skin malignancy.  Continues to follow with dermatology regarding this issue.  4.  Antiemetics: No nausea or vomiting reported.  Compazine is available to her.  5.  IV access: Peripheral veins are currently in use and she declined Port-A-Cath.  6.  Breast cancer: She is currently on active surveillance without any evidence of relapse based on imaging studies.  7.  Covid vaccination considerations: He has completed vaccination series up-to-date.  8.  Follow-up: In 3 weeks for the next cycle of therapy.  30 minutes were dedicated to this visit.  The time was spent on reviewing her disease status, discussing treatment options and future plan of care review.   Zola Button, MD 11/5/20219:48 AM

## 2020-08-21 ENCOUNTER — Telehealth: Payer: Self-pay | Admitting: Oncology

## 2020-08-21 NOTE — Telephone Encounter (Signed)
Scheduled per los, patient has been called and notified. 

## 2020-09-01 ENCOUNTER — Ambulatory Visit: Payer: Medicare Other

## 2020-09-01 ENCOUNTER — Other Ambulatory Visit: Payer: Medicare Other

## 2020-09-01 ENCOUNTER — Ambulatory Visit: Payer: Medicare Other | Admitting: Oncology

## 2020-09-06 ENCOUNTER — Other Ambulatory Visit: Payer: Medicare Other

## 2020-09-06 ENCOUNTER — Inpatient Hospital Stay: Payer: Medicare Other

## 2020-09-06 ENCOUNTER — Other Ambulatory Visit: Payer: Self-pay

## 2020-09-06 ENCOUNTER — Inpatient Hospital Stay: Payer: Medicare Other | Attending: Oncology

## 2020-09-06 ENCOUNTER — Inpatient Hospital Stay (HOSPITAL_BASED_OUTPATIENT_CLINIC_OR_DEPARTMENT_OTHER): Payer: Medicare Other | Admitting: Oncology

## 2020-09-06 ENCOUNTER — Ambulatory Visit: Payer: Medicare Other | Admitting: Oncology

## 2020-09-06 VITALS — BP 144/76 | HR 70 | Temp 97.2°F | Resp 16 | Ht 65.0 in | Wt 150.7 lb

## 2020-09-06 DIAGNOSIS — Z5112 Encounter for antineoplastic immunotherapy: Secondary | ICD-10-CM | POA: Diagnosis not present

## 2020-09-06 DIAGNOSIS — Z853 Personal history of malignant neoplasm of breast: Secondary | ICD-10-CM | POA: Insufficient documentation

## 2020-09-06 DIAGNOSIS — E039 Hypothyroidism, unspecified: Secondary | ICD-10-CM

## 2020-09-06 DIAGNOSIS — C211 Malignant neoplasm of anal canal: Secondary | ICD-10-CM

## 2020-09-06 DIAGNOSIS — Z79899 Other long term (current) drug therapy: Secondary | ICD-10-CM | POA: Insufficient documentation

## 2020-09-06 LAB — CMP (CANCER CENTER ONLY)
ALT: 25 U/L (ref 0–44)
AST: 25 U/L (ref 15–41)
Albumin: 3.9 g/dL (ref 3.5–5.0)
Alkaline Phosphatase: 78 U/L (ref 38–126)
Anion gap: 10 (ref 5–15)
BUN: 13 mg/dL (ref 8–23)
CO2: 24 mmol/L (ref 22–32)
Calcium: 9.4 mg/dL (ref 8.9–10.3)
Chloride: 105 mmol/L (ref 98–111)
Creatinine: 0.64 mg/dL (ref 0.44–1.00)
GFR, Estimated: >60 mL/min (ref >60)
Glucose, Bld: 94 mg/dL (ref 70–99)
Potassium: 4.7 mmol/L (ref 3.5–5.1)
Sodium: 139 mmol/L (ref 135–145)
Total Bilirubin: 0.4 mg/dL (ref 0.3–1.2)
Total Protein: 6.9 g/dL (ref 6.5–8.1)

## 2020-09-06 LAB — CBC WITH DIFFERENTIAL (CANCER CENTER ONLY)
Abs Immature Granulocytes: 0.04 10*3/uL (ref 0.00–0.07)
Basophils Absolute: 0.1 10*3/uL (ref 0.0–0.1)
Basophils Relative: 2 %
Eosinophils Absolute: 0.7 10*3/uL — ABNORMAL HIGH (ref 0.0–0.5)
Eosinophils Relative: 7 %
HCT: 41.3 % (ref 36.0–46.0)
Hemoglobin: 13.4 g/dL (ref 12.0–15.0)
Immature Granulocytes: 0 %
Lymphocytes Relative: 19 %
Lymphs Abs: 1.8 10*3/uL (ref 0.7–4.0)
MCH: 31 pg (ref 26.0–34.0)
MCHC: 32.4 g/dL (ref 30.0–36.0)
MCV: 95.6 fL (ref 80.0–100.0)
Monocytes Absolute: 0.8 10*3/uL (ref 0.1–1.0)
Monocytes Relative: 8 %
Neutro Abs: 5.9 10*3/uL (ref 1.7–7.7)
Neutrophils Relative %: 64 %
Platelet Count: 276 10*3/uL (ref 150–400)
RBC: 4.32 MIL/uL (ref 3.87–5.11)
RDW: 13.5 % (ref 11.5–15.5)
WBC Count: 9.3 10*3/uL (ref 4.0–10.5)
nRBC: 0 % (ref 0.0–0.2)

## 2020-09-06 LAB — TSH: TSH: 2.108 u[IU]/mL (ref 0.308–3.960)

## 2020-09-06 MED ORDER — SODIUM CHLORIDE 0.9 % IV SOLN
Freq: Once | INTRAVENOUS | Status: DC
  Filled 2020-09-06: qty 250

## 2020-09-06 MED ORDER — SODIUM CHLORIDE 0.9 % IV SOLN
200.0000 mg | Freq: Once | INTRAVENOUS | Status: AC
  Administered 2020-09-06: 200 mg via INTRAVENOUS
  Filled 2020-09-06: qty 8

## 2020-09-06 NOTE — Progress Notes (Signed)
Hematology and Oncology Follow Up Visit  Angela Carpenter [id number] [birth year] 79 y.o. 09/06/2020 11:36 AM Tisovec, [name], MDTisovec, [name], MD   Principle Diagnosis: 79 year old woman with stage IV mucosal melanoma diagnosed in August 2020.  She developed a tumor arising from of the anal canal with pelvic nodule.   Prior Therapy:   She is status post perianal excision in August 2020.  Current therapy: Pembrolizumab 200 mg every 3 weeks started on July 29, 2019.  She is here for cycle 20 of therapy.  Interim History: Angela Carpenter is here for a follow-up visit.  Since her last visit, she reports no major changes in her health.  She continues to tolerate Pembrolizumab without any major concerns.  She denies any nausea, vomiting or abdominal pain.  He denies any respiratory complaints including cough, wheezing or hemoptysis.  Performance status quality of life remain excellent.       Medications: Unchanged on review. Current Outpatient Medications  Medication Sig Dispense Refill  . acyclovir (ZOVIRAX) 400 MG tablet Take 400 mg by mouth 2 (two) times daily.    Marland Kitchen alendronate (FOSAMAX) 70 MG tablet Take 70 mg by mouth once a week. Take with a full glass of water on an empty stomach.    Marland Kitchen atorvastatin (LIPITOR) 10 MG tablet Take 1 tablet (10 mg total) by mouth daily. 90 tablet 3  . Cholecalciferol (VITAMIN D-3) 125 MCG (5000 UT) TABS Take 5,000 Units by mouth daily.     Marland Kitchen ELIQUIS 5 MG TABS tablet TAKE 1 TABLET TWICE A DAY 180 tablet 1  . metoprolol succinate (TOPROL-XL) 25 MG 24 hr tablet Take 1 tablet (25 mg total) by mouth 2 (two) times daily. 180 tablet 3  . Multiple Vitamins-Minerals (PRESERVISION AREDS 2+MULTI VIT) CAPS Take 1 capsule by mouth 2 (two) times daily.    . raloxifene (EVISTA) 60 MG tablet Take 60 mg by mouth daily.    . timolol (TIMOPTIC) 0.5 % ophthalmic solution Place 1 drop into both eyes 2 (two) times daily.     Current Facility-Administered Medications   Medication Dose Route Frequency Provider Last Rate Last Admin  . 0.9 %  sodium chloride infusion  500 mL Intravenous Continuous Danis, Estill Cotta III, MD         Allergies: No Known Allergies     Physical Exam:          Blood pressure (!) 144/76, pulse 70, temperature (!) 97.2 F (36.2 C), temperature source Tympanic, resp. rate 16, height 5\' 5"  (1.651 m), weight 150 lb 11.2 oz (68.4 kg), SpO2 100 %.        ECOG: 1    General appearance: Alert, awake without any distress. Head: Atraumatic without abnormalities Oropharynx: Without any thrush or ulcers. Eyes: No scleral icterus. Lymph nodes: No lymphadenopathy noted in the cervical, supraclavicular, or axillary nodes Heart:regular rate and rhythm, without any murmurs or gallops.   Lung: Clear to auscultation without any rhonchi, wheezes or dullness to percussion. Abdomin: Soft, nontender without any shifting dullness or ascites. Musculoskeletal: No clubbing or cyanosis. Neurological: No motor or sensory deficits. Skin: No rashes or lesions.                       Lab Results: Lab Results  Component Value Date   WBC 8.3 08/11/2020   HGB 13.5 08/11/2020   HCT 41.3 08/11/2020   MCV 93.4 08/11/2020   PLT 278 08/11/2020     Chemistry  Component Value Date/Time   NA 139 08/11/2020 1010   NA 138 12/05/2016 0750   K 4.2 08/11/2020 1010   CL 104 08/11/2020 1010   CO2 29 08/11/2020 1010   BUN 12 08/11/2020 1010   BUN 15 12/05/2016 0750   CREATININE 0.51 08/11/2020 1010   CREATININE 0.70 09/05/2015 0828      Component Value Date/Time   CALCIUM 9.1 08/11/2020 1010   ALKPHOS 71 08/11/2020 1010   AST 21 08/11/2020 1010   ALT 21 08/11/2020 1010   BILITOT 0.4 08/11/2020 1010           Impression and Plan:  79 year old with:  1.   Stage IV mucosal melanoma of the anal canal diagnosed in August 2020.   The natural course of her disease was reviewed and treatment options  were discussed.  She continues to have excellent tolerance to Pembrolizumab and reasonable response by imaging criteria.  Risks and benefits of continuing Pembrolizumab versus discontinuation were discussed at this time.  The plan is to continue with the same dose and schedule repeat imaging studies in 2 months.  She is agreeable to proceed at this time.     2.  Immune mediated complications: I continue to educate her about potential complications including pneumonitis, colitis and thyroid disease.   3. Dermatology surveillance: Continues to follow with dermatology for surveillance purposes.  4.  Antiemetics: Compazine is available to her without any recent nausea vomiting.  5.  IV access: No need for Port-A-Cath at this time peripheral veins are currently in use.  6.  Breast cancer: No evidence of relapse and currently on active surveillance.  7.  Follow-up: She will return in 3 weeks for evaluation prior to the next cycle of therapy.  30 minutes were spent on this encounter.  The time was dedicated to reviewing her disease status, discussing treatment options and addressing complications related to her cancer and cancer therapy.   Zola Button, MD 12/1/202111:36 AM

## 2020-09-06 NOTE — Patient Instructions (Signed)
[facility] Discharge Instructions for Patients Receiving Chemotherapy  Today you received the following chemotherapy agents:  Keytruda.  To help prevent nausea and vomiting after your treatment, we encourage you to take your nausea medication as directed.   If you develop nausea and vomiting that is not controlled by your nausea medication, call the clinic.   BELOW ARE SYMPTOMS THAT SHOULD BE REPORTED IMMEDIATELY:  *FEVER GREATER THAN 100.5 F  *CHILLS WITH OR WITHOUT FEVER  NAUSEA AND VOMITING THAT IS NOT CONTROLLED WITH YOUR NAUSEA MEDICATION  *UNUSUAL SHORTNESS OF BREATH  *UNUSUAL BRUISING OR BLEEDING  TENDERNESS IN MOUTH AND THROAT WITH OR WITHOUT PRESENCE OF ULCERS  *URINARY PROBLEMS  *BOWEL PROBLEMS  UNUSUAL RASH Items with * indicate a potential emergency and should be followed up as soon as possible.  Feel free to call the clinic should you have any questions or concerns. The clinic phone number is [phone].  Please show the CHEMO ALERT CARD at check-in to the Emergency Department and triage nurse.    

## 2020-09-08 ENCOUNTER — Other Ambulatory Visit: Payer: Self-pay | Admitting: Cardiology

## 2020-09-08 DIAGNOSIS — I119 Hypertensive heart disease without heart failure: Secondary | ICD-10-CM

## 2020-09-08 DIAGNOSIS — E7849 Other hyperlipidemia: Secondary | ICD-10-CM

## 2020-09-08 NOTE — Telephone Encounter (Signed)
Prescription refill request for Eliquis received. Indication: afib Last office visit: [name] 03/23/2020 Scr: 0.64, 09/06/2020 Age: 79 yo Weight: 68.4 kg   Prescription refill sent.

## 2020-09-20 DIAGNOSIS — C4362 Malignant melanoma of left upper limb, including shoulder: Secondary | ICD-10-CM | POA: Diagnosis not present

## 2020-09-20 DIAGNOSIS — E7800 Pure hypercholesterolemia, unspecified: Secondary | ICD-10-CM | POA: Diagnosis not present

## 2020-09-20 DIAGNOSIS — M810 Age-related osteoporosis without current pathological fracture: Secondary | ICD-10-CM | POA: Diagnosis not present

## 2020-09-20 DIAGNOSIS — H409 Unspecified glaucoma: Secondary | ICD-10-CM | POA: Diagnosis not present

## 2020-09-20 DIAGNOSIS — I10 Essential (primary) hypertension: Secondary | ICD-10-CM | POA: Diagnosis not present

## 2020-09-20 DIAGNOSIS — I480 Paroxysmal atrial fibrillation: Secondary | ICD-10-CM | POA: Diagnosis not present

## 2020-09-20 DIAGNOSIS — I679 Cerebrovascular disease, unspecified: Secondary | ICD-10-CM | POA: Diagnosis not present

## 2020-09-20 DIAGNOSIS — Z7901 Long term (current) use of anticoagulants: Secondary | ICD-10-CM | POA: Diagnosis not present

## 2020-09-20 DIAGNOSIS — C211 Malignant neoplasm of anal canal: Secondary | ICD-10-CM | POA: Diagnosis not present

## 2020-09-20 DIAGNOSIS — D6869 Other thrombophilia: Secondary | ICD-10-CM | POA: Diagnosis not present

## 2020-09-26 ENCOUNTER — Other Ambulatory Visit: Payer: Self-pay | Admitting: Oncology

## 2020-09-26 DIAGNOSIS — C211 Malignant neoplasm of anal canal: Secondary | ICD-10-CM

## 2020-09-26 DIAGNOSIS — E039 Hypothyroidism, unspecified: Secondary | ICD-10-CM

## 2020-09-26 DIAGNOSIS — Z5112 Encounter for antineoplastic immunotherapy: Secondary | ICD-10-CM

## 2020-09-27 ENCOUNTER — Other Ambulatory Visit: Payer: Self-pay

## 2020-09-27 ENCOUNTER — Inpatient Hospital Stay: Payer: Medicare Other

## 2020-09-27 ENCOUNTER — Inpatient Hospital Stay (HOSPITAL_BASED_OUTPATIENT_CLINIC_OR_DEPARTMENT_OTHER): Payer: Medicare Other | Admitting: Oncology

## 2020-09-27 VITALS — BP 133/65 | HR 72 | Temp 97.8°F | Resp 18 | Ht 65.0 in | Wt 148.1 lb

## 2020-09-27 DIAGNOSIS — Z79899 Other long term (current) drug therapy: Secondary | ICD-10-CM | POA: Diagnosis not present

## 2020-09-27 DIAGNOSIS — Z5112 Encounter for antineoplastic immunotherapy: Secondary | ICD-10-CM | POA: Diagnosis not present

## 2020-09-27 DIAGNOSIS — C211 Malignant neoplasm of anal canal: Secondary | ICD-10-CM

## 2020-09-27 DIAGNOSIS — Z853 Personal history of malignant neoplasm of breast: Secondary | ICD-10-CM | POA: Diagnosis not present

## 2020-09-27 DIAGNOSIS — E039 Hypothyroidism, unspecified: Secondary | ICD-10-CM

## 2020-09-27 LAB — CBC WITH DIFFERENTIAL (CANCER CENTER ONLY)
Abs Immature Granulocytes: 0.03 10*3/uL (ref 0.00–0.07)
Basophils Absolute: 0.1 10*3/uL (ref 0.0–0.1)
Basophils Relative: 1 %
Eosinophils Absolute: 0.8 10*3/uL — ABNORMAL HIGH (ref 0.0–0.5)
Eosinophils Relative: 9 %
HCT: 41.5 % (ref 36.0–46.0)
Hemoglobin: 13.5 g/dL (ref 12.0–15.0)
Immature Granulocytes: 0 %
Lymphocytes Relative: 18 %
Lymphs Abs: 1.7 10*3/uL (ref 0.7–4.0)
MCH: 30.8 pg (ref 26.0–34.0)
MCHC: 32.5 g/dL (ref 30.0–36.0)
MCV: 94.7 fL (ref 80.0–100.0)
Monocytes Absolute: 0.9 10*3/uL (ref 0.1–1.0)
Monocytes Relative: 10 %
Neutro Abs: 5.6 10*3/uL (ref 1.7–7.7)
Neutrophils Relative %: 62 %
Platelet Count: 263 10*3/uL (ref 150–400)
RBC: 4.38 MIL/uL (ref 3.87–5.11)
RDW: 13.1 % (ref 11.5–15.5)
WBC Count: 9.1 10*3/uL (ref 4.0–10.5)
nRBC: 0 % (ref 0.0–0.2)

## 2020-09-27 LAB — CMP (CANCER CENTER ONLY)
ALT: 22 U/L (ref 0–44)
AST: 22 U/L (ref 15–41)
Albumin: 3.9 g/dL (ref 3.5–5.0)
Alkaline Phosphatase: 71 U/L (ref 38–126)
Anion gap: 7 (ref 5–15)
BUN: 16 mg/dL (ref 8–23)
CO2: 27 mmol/L (ref 22–32)
Calcium: 9.2 mg/dL (ref 8.9–10.3)
Chloride: 105 mmol/L (ref 98–111)
Creatinine: 0.71 mg/dL (ref 0.44–1.00)
GFR, Estimated: >60 mL/min (ref >60)
Glucose, Bld: 92 mg/dL (ref 70–99)
Potassium: 4.8 mmol/L (ref 3.5–5.1)
Sodium: 139 mmol/L (ref 135–145)
Total Bilirubin: 0.4 mg/dL (ref 0.3–1.2)
Total Protein: 6.7 g/dL (ref 6.5–8.1)

## 2020-09-27 LAB — TSH: TSH: 2.162 u[IU]/mL (ref 0.308–3.960)

## 2020-09-27 MED ORDER — SODIUM CHLORIDE 0.9 % IV SOLN
200.0000 mg | Freq: Once | INTRAVENOUS | Status: AC
  Administered 2020-09-27: 200 mg via INTRAVENOUS
  Filled 2020-09-27: qty 8

## 2020-09-27 MED ORDER — SODIUM CHLORIDE 0.9 % IV SOLN
Freq: Once | INTRAVENOUS | Status: AC
  Filled 2020-09-27: qty 250

## 2020-09-27 NOTE — Progress Notes (Signed)
Hematology and Oncology Follow Up Visit  Angela Carpenter [id number] Sep 29, 1941 79 y.o. 09/27/2020 9:36 AM Tisovec, [name], MDTisovec, [name], MD   Principle Diagnosis: 79 year old woman with melanoma arising from the anal canal diagnosed in August 2020. 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  Prior Therapy:   She is status post perianal excision in August 2020.  Current therapy: Pembrolizumab 200 mg every 3 weeks started on July 29, 2019.  She is here for cycle 21 of therapy.  Interim History: Angela Carpenter returns today for repeat evaluation. Since the last visit, she reports no major changes in her health.  She denies any recent hospitalization or illnesses.  She denies any nausea vomiting or abdominal pain.  She denies any cough wheezing or respiratory complaints.  She does report some skin itching but abdominally related to dry skin.  She denies any diffuse rash or new lesions.       Medications: Updated on review. Current Outpatient Medications  Medication Sig Dispense Refill  . acyclovir (ZOVIRAX) 400 MG tablet Take 400 mg by mouth 2 (two) times daily.    Marland Kitchen alendronate (FOSAMAX) 70 MG tablet Take 70 mg by mouth once a week. Take with a full glass of water on an empty stomach.    Marland Kitchen atorvastatin (LIPITOR) 10 MG tablet Take 1 tablet (10 mg total) by mouth daily. 90 tablet 3  . Cholecalciferol (VITAMIN D-3) 125 MCG (5000 UT) TABS Take 5,000 Units by mouth daily.     Marland Kitchen ELIQUIS 5 MG TABS tablet TAKE 1 TABLET TWICE A DAY 180 tablet 1  . metoprolol succinate (TOPROL-XL) 25 MG 24 hr tablet Take 1 tablet (25 mg total) by mouth 2 (two) times daily. 180 tablet 3  . Multiple Vitamins-Minerals (PRESERVISION AREDS 2+MULTI VIT) CAPS Take 1 capsule by mouth 2 (two) times daily.    . raloxifene (EVISTA) 60 MG tablet Take 60 mg by mouth daily.    . timolol (TIMOPTIC) 0.5 % ophthalmic solution Place 1 drop into both eyes 2 (two) times daily.     Current  Facility-Administered Medications  Medication Dose Route Frequency Provider Last Rate Last Admin  . 0.9 %  sodium chloride infusion  500 mL Intravenous Continuous Danis, Estill Cotta III, MD         Allergies: No Known Allergies     Physical Exam: Blood pressure 133/65, pulse 72, temperature 97.8 F (36.6 C), temperature source Tympanic, resp. rate 18, height 5\' 5"  (1.651 m), weight 148 lb 1.6 oz (67.2 kg), SpO2 100 %.   ECOG: 1     General appearance: Comfortable appearing without any discomfort Head: Normocephalic without any trauma Oropharynx: Mucous membranes are moist and pink without any thrush or ulcers. Eyes: Pupils are equal and round reactive to light. Lymph nodes: No cervical, supraclavicular, inguinal or axillary lymphadenopathy.   Heart:regular rate and rhythm.  S1 and S2 without leg edema. Lung: Clear without any rhonchi or wheezes.  No dullness to percussion. Abdomin: Soft, nontender, nondistended with good bowel sounds.  No hepatosplenomegaly. Musculoskeletal: No joint deformity or effusion.  Full range of motion noted. Neurological: No deficits noted on motor, sensory and deep tendon reflex exam. Skin: No petechial rash or dryness.  Appeared moist.                          Lab Results: Lab Results  Component Value Date   WBC 9.3 09/06/2020   HGB 13.4 09/06/2020  HCT 41.3 09/06/2020   MCV 95.6 09/06/2020   PLT 276 09/06/2020     Chemistry      Component Value Date/Time   NA 139 09/06/2020 1133   NA 138 12/05/2016 0750   K 4.7 09/06/2020 1133   CL 105 09/06/2020 1133   CO2 24 09/06/2020 1133   BUN 13 09/06/2020 1133   BUN 15 12/05/2016 0750   CREATININE 0.64 09/06/2020 1133   CREATININE 0.70 09/05/2015 0828      Component Value Date/Time   CALCIUM 9.4 09/06/2020 1133   ALKPHOS 78 09/06/2020 1133   AST 25 09/06/2020 1133   ALT 25 09/06/2020 1133   BILITOT 0.4 09/06/2020 1133           Impression and  Plan:  79 year old with:  1.  Mucosal melanoma diagnosed in August 2020. She developed stage IV disease with pelvic nodules.  She continues to tolerate Pembrolizumab without any major complaints. Risks and benefits of continuing this treatment long-term were discussed. Alternative treatment options were also reiterated. The plan is to repeat imaging studies in February 2022.  She is agreeable with this plan.     2.  Immune mediated complications: She has not experienced any complications including pneumonitis, colitis and thyroid disease. We'll continue to educate her about autoimmune issues associated with this treatment.   3. Dermatology surveillance: She is up-to-date with her dermatology surveillance which I encouraged her to continue to do so.  4.  Antiemetics: No nausea or vomiting reported at this time. Compazine is available to her.  5.  IV access: Peripheral veins currently in use without any issues.  6.  Breast cancer: She is currently on active surveillance without any evidence of disease relapse.  7.  Follow-up: In 3 weeks for repeat evaluation and next cycle of therapy.  30 minutes were dedicated to this visit. The time was spent on reviewing her disease status, reviewing treatment options and future plan of care review.   Angela Button, MD 12/22/20219:36 AM

## 2020-09-27 NOTE — Patient Instructions (Signed)
[facility] Discharge Instructions for Patients Receiving Chemotherapy  Today you received the following chemotherapy agents: pembrolizumab.  To help prevent nausea and vomiting after your treatment, we encourage you to take your nausea medication as directed.   If you develop nausea and vomiting that is not controlled by your nausea medication, call the clinic.   BELOW ARE SYMPTOMS THAT SHOULD BE REPORTED IMMEDIATELY:  *FEVER GREATER THAN 100.5 F  *CHILLS WITH OR WITHOUT FEVER  NAUSEA AND VOMITING THAT IS NOT CONTROLLED WITH YOUR NAUSEA MEDICATION  *UNUSUAL SHORTNESS OF BREATH  *UNUSUAL BRUISING OR BLEEDING  TENDERNESS IN MOUTH AND THROAT WITH OR WITHOUT PRESENCE OF ULCERS  *URINARY PROBLEMS  *BOWEL PROBLEMS  UNUSUAL RASH Items with * indicate a potential emergency and should be followed up as soon as possible.  Feel free to call the clinic should you have any questions or concerns. The clinic phone number is [phone].  Please show the CHEMO ALERT CARD at check-in to the Emergency Department and triage nurse.   

## 2020-10-17 ENCOUNTER — Telehealth: Payer: Self-pay | Admitting: Oncology

## 2020-10-17 NOTE — Telephone Encounter (Signed)
Called patient regarding upcoming appointments, patient is notified. 

## 2020-10-18 ENCOUNTER — Other Ambulatory Visit: Payer: Self-pay

## 2020-10-18 ENCOUNTER — Inpatient Hospital Stay: Payer: Medicare Other | Attending: Oncology

## 2020-10-18 ENCOUNTER — Inpatient Hospital Stay (HOSPITAL_BASED_OUTPATIENT_CLINIC_OR_DEPARTMENT_OTHER): Payer: Medicare Other | Admitting: Oncology

## 2020-10-18 ENCOUNTER — Inpatient Hospital Stay: Payer: Medicare Other

## 2020-10-18 VITALS — BP 132/64 | HR 72 | Temp 97.8°F | Resp 18 | Ht 65.0 in | Wt 147.2 lb

## 2020-10-18 DIAGNOSIS — Z79899 Other long term (current) drug therapy: Secondary | ICD-10-CM | POA: Insufficient documentation

## 2020-10-18 DIAGNOSIS — E039 Hypothyroidism, unspecified: Secondary | ICD-10-CM

## 2020-10-18 DIAGNOSIS — Z853 Personal history of malignant neoplasm of breast: Secondary | ICD-10-CM | POA: Insufficient documentation

## 2020-10-18 DIAGNOSIS — C211 Malignant neoplasm of anal canal: Secondary | ICD-10-CM | POA: Diagnosis not present

## 2020-10-18 DIAGNOSIS — Z5112 Encounter for antineoplastic immunotherapy: Secondary | ICD-10-CM | POA: Diagnosis not present

## 2020-10-18 LAB — CBC WITH DIFFERENTIAL (CANCER CENTER ONLY)
Abs Immature Granulocytes: 0.04 10*3/uL (ref 0.00–0.07)
Basophils Absolute: 0.1 10*3/uL (ref 0.0–0.1)
Basophils Relative: 1 %
Eosinophils Absolute: 0.7 10*3/uL — ABNORMAL HIGH (ref 0.0–0.5)
Eosinophils Relative: 8 %
HCT: 42.5 % (ref 36.0–46.0)
Hemoglobin: 13.8 g/dL (ref 12.0–15.0)
Immature Granulocytes: 1 %
Lymphocytes Relative: 19 %
Lymphs Abs: 1.6 10*3/uL (ref 0.7–4.0)
MCH: 30.9 pg (ref 26.0–34.0)
MCHC: 32.5 g/dL (ref 30.0–36.0)
MCV: 95.3 fL (ref 80.0–100.0)
Monocytes Absolute: 0.7 10*3/uL (ref 0.1–1.0)
Monocytes Relative: 9 %
Neutro Abs: 5.5 10*3/uL (ref 1.7–7.7)
Neutrophils Relative %: 62 %
Platelet Count: 314 10*3/uL (ref 150–400)
RBC: 4.46 MIL/uL (ref 3.87–5.11)
RDW: 13.3 % (ref 11.5–15.5)
WBC Count: 8.6 10*3/uL (ref 4.0–10.5)
nRBC: 0 % (ref 0.0–0.2)

## 2020-10-18 LAB — CMP (CANCER CENTER ONLY)
ALT: 24 U/L (ref 0–44)
AST: 22 U/L (ref 15–41)
Albumin: 3.9 g/dL (ref 3.5–5.0)
Alkaline Phosphatase: 70 U/L (ref 38–126)
Anion gap: 6 (ref 5–15)
BUN: 14 mg/dL (ref 8–23)
CO2: 28 mmol/L (ref 22–32)
Calcium: 9.2 mg/dL (ref 8.9–10.3)
Chloride: 103 mmol/L (ref 98–111)
Creatinine: 0.71 mg/dL (ref 0.44–1.00)
GFR, Estimated: >60 mL/min (ref >60)
Glucose, Bld: 95 mg/dL (ref 70–99)
Potassium: 4.5 mmol/L (ref 3.5–5.1)
Sodium: 137 mmol/L (ref 135–145)
Total Bilirubin: 0.5 mg/dL (ref 0.3–1.2)
Total Protein: 6.7 g/dL (ref 6.5–8.1)

## 2020-10-18 LAB — TSH: TSH: 1.903 u[IU]/mL (ref 0.308–3.960)

## 2020-10-18 MED ORDER — SODIUM CHLORIDE 0.9 % IV SOLN
Freq: Once | INTRAVENOUS | Status: AC
Start: 2020-10-18 — End: 2020-10-18
  Filled 2020-10-18: qty 250

## 2020-10-18 MED ORDER — PEMBROLIZUMAB CHEMO INJECTION 100 MG/4ML
200.0000 mg | Freq: Once | INTRAVENOUS | Status: AC
  Administered 2020-10-18: 200 mg via INTRAVENOUS
  Filled 2020-10-18: qty 8

## 2020-10-18 NOTE — Progress Notes (Signed)
Hematology and Oncology Follow Up Visit  Angela Carpenter [id number] 1941/02/18 80 y.o. 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   Prior Therapy:   She is status post perianal excision in August 2020.  Current therapy: Pembrolizumab 200 mg every 3 weeks started on July 29, 2019.  She is here for cycle 22 of therapy.  Interim History: [name] is here for return follow-up.  Since the last visit, she reports no major changes in her health.  She continues to tolerate Pembrolizumab without any recent concerns or complications.  She denies any nausea, fatigue or weight loss.  She denies any skin rash or pruritus.  She remains active and attends to activities of daily living.       Medications: Unchanged on review. Current Outpatient Medications  Medication Sig Dispense Refill  . acyclovir (ZOVIRAX) 400 MG tablet Take 400 mg by mouth 2 (two) times daily.    Marland Kitchen alendronate (FOSAMAX) 70 MG tablet Take 70 mg by mouth once a week. Take with a full glass of water on an empty stomach.    Marland Kitchen atorvastatin (LIPITOR) 10 MG tablet Take 1 tablet (10 mg total) by mouth daily. 90 tablet 3  . Cholecalciferol (VITAMIN D-3) 125 MCG (5000 UT) TABS Take 5,000 Units by mouth daily.     Marland Kitchen ELIQUIS 5 MG TABS tablet TAKE 1 TABLET TWICE A DAY 180 tablet 1  . metoprolol succinate (TOPROL-XL) 25 MG 24 hr tablet Take 1 tablet (25 mg total) by mouth 2 (two) times daily. 180 tablet 3  . Multiple Vitamins-Minerals (PRESERVISION AREDS 2+MULTI VIT) CAPS Take 1 capsule by mouth 2 (two) times daily.    . raloxifene (EVISTA) 60 MG tablet Take 60 mg by mouth daily.    . timolol (TIMOPTIC) 0.5 % ophthalmic solution Place 1 drop into both eyes 2 (two) times daily.     Current Facility-Administered Medications  Medication Dose Route Frequency Provider Last  Rate Last Admin  . 0.9 %  sodium chloride infusion  500 mL Intravenous Continuous Danis, Estill Cotta III, MD         Allergies: No Known Allergies     Physical Exam:  Blood pressure 132/64, pulse 72, temperature 97.8 F (36.6 C), temperature source Tympanic, resp. rate 18, height 5\' 5"  (1.651 m), weight 147 lb 3.2 oz (66.8 kg), SpO2 100 %.   ECOG: 1    General appearance: Alert, awake without any distress. Head: Atraumatic without abnormalities Oropharynx: Without any thrush or ulcers. Eyes: No scleral icterus. Lymph nodes: No lymphadenopathy noted in the cervical, supraclavicular, or axillary nodes Heart:regular rate and rhythm, without any murmurs or gallops.   Lung: Clear to auscultation without any rhonchi, wheezes or dullness to percussion. Abdomin: Soft, nontender without any shifting dullness or ascites. Musculoskeletal: No clubbing or cyanosis. Neurological: No motor or sensory deficits. Skin: No rashes or lesions.                         Lab Results: Lab Results  Component Value Date   WBC 9.1 09/27/2020   HGB 13.5 09/27/2020   HCT 41.5 09/27/2020   MCV 94.7 09/27/2020   PLT 263 09/27/2020     Chemistry      Component Value Date/Time   NA 139 09/27/2020 0924   NA 138 12/05/2016  0750   K 4.8 09/27/2020 0924   CL 105 09/27/2020 0924   CO2 27 09/27/2020 0924   BUN 16 09/27/2020 0924   BUN 15 12/05/2016 0750   CREATININE 0.71 09/27/2020 0924   CREATININE 0.70 09/05/2015 0828      Component Value Date/Time   CALCIUM 9.2 09/27/2020 0924   ALKPHOS 71 09/27/2020 0924   AST 22 09/27/2020 0924   ALT 22 09/27/2020 0924   BILITOT 0.4 09/27/2020 0924           Impression and Plan:  80 year old with:  1.   Stage IV mucosal melanoma after presenting with pelvic nodules in August 2020.   The natural course of her disease was reviewed and different salvage therapy options were discussed.  She continues to tolerate Pembrolizumab  without any complaints.  Risks and benefits of continuing this treatment including autoimmune complications as well as dermatitis among others.  The plan is to update her staging scans before the next visit and therapy discontinuation will be also discussed.  After discussion today, she is agreeable to proceed with treatment and will reevaluate after imaging studies to duration of therapy.   2.  Immune mediated complications: I continue to educate her about potential complication occluding pneumonitis colitis and thyroid disease.   3. Dermatology surveillance: No new dermatological issues noted.  She continues to follow with routine dermatology follow-up.  4.  Antiemetics: Compazine is available to her without any nausea or vomiting.  5.  IV access: No issues reported with her peripheral veins.  6.  Breast cancer: Evidence of relapse noted.  Repeat imaging studies will update.  7.  Follow-up: In 3 weeks for the next cycle of therapy.  30 minutes were spent on this encounter.  The time was dedicated to updating her disease status, addressing complications related to her cancer and cancer therapy.   Zola Button, MD 1/12/202211:23 AM

## 2020-10-18 NOTE — Patient Instructions (Signed)
[facility] Discharge Instructions for Patients Receiving Chemotherapy  Today you received the following chemotherapy agent: Pembrolizumab.  To help prevent nausea and vomiting after your treatment, we encourage you to take your nausea medication as directed.   If you develop nausea and vomiting that is not controlled by your nausea medication, call the clinic.   BELOW ARE SYMPTOMS THAT SHOULD BE REPORTED IMMEDIATELY:  *FEVER GREATER THAN 100.5 F  *CHILLS WITH OR WITHOUT FEVER  NAUSEA AND VOMITING THAT IS NOT CONTROLLED WITH YOUR NAUSEA MEDICATION  *UNUSUAL SHORTNESS OF BREATH  *UNUSUAL BRUISING OR BLEEDING  TENDERNESS IN MOUTH AND THROAT WITH OR WITHOUT PRESENCE OF ULCERS  *URINARY PROBLEMS  *BOWEL PROBLEMS  UNUSUAL RASH Items with * indicate a potential emergency and should be followed up as soon as possible.  Feel free to call the clinic should you have any questions or concerns. The clinic phone number is [phone].  Please show the CHEMO ALERT CARD at check-in to the Emergency Department and triage nurse.   

## 2020-10-31 ENCOUNTER — Other Ambulatory Visit: Payer: Self-pay

## 2020-10-31 ENCOUNTER — Ambulatory Visit (HOSPITAL_COMMUNITY)
Admission: RE | Admit: 2020-10-31 | Discharge: 2020-10-31 | Disposition: A | Discharge status: Home / Self Care | Payer: Medicare Other | Source: Ambulatory Visit | Attending: Oncology | Admitting: Oncology

## 2020-10-31 DIAGNOSIS — C211 Malignant neoplasm of anal canal: Secondary | ICD-10-CM | POA: Insufficient documentation

## 2020-10-31 DIAGNOSIS — C4351 Malignant melanoma of anal skin: Secondary | ICD-10-CM | POA: Diagnosis not present

## 2020-10-31 LAB — GLUCOSE, CAPILLARY: Glucose-Capillary: 93 mg/dL (ref 70–99)

## 2020-10-31 IMAGING — PT NM PET IMAGE RESTAGE (PS) WHOLE BODY
8 series · 25 of 25 positions shown · non-contrast
Comparison: [DATE]

CLINICAL DATA: Subsequent treatment strategy for melanoma of the
anal canal.

EXAM:
NUCLEAR MEDICINE PET WHOLE BODY
TECHNIQUE: 7.2 mCi F-18 FDG was injected intravenously. Full-ring PET imaging
was performed from the head to foot after the radiotracer. CT data
was obtained and used for attenuation correction and anatomic
localization.
Fasting blood glucose: 93 mg/dl

[Series 3: pet wb ac · axial · 5.0mm · 4.07mm/px · z∈[-398,+1398]mm · 5 of 450 slices shown]
[im 1/450]
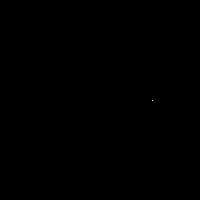
[im 113/450]
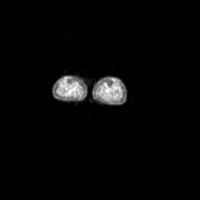
[im 225/450]
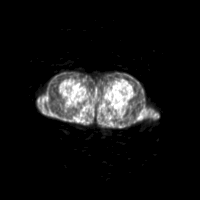
[im 337/450]
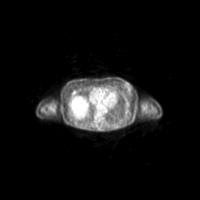
[im 450/450]
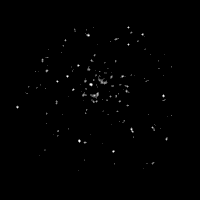

[Series 4: ct wb 5.0 hd_fov · axial · 5.0mm · 1.17mm/px · z∈[-410,+1398]mm · 5 of 453 slices shown]
[im 1/453]
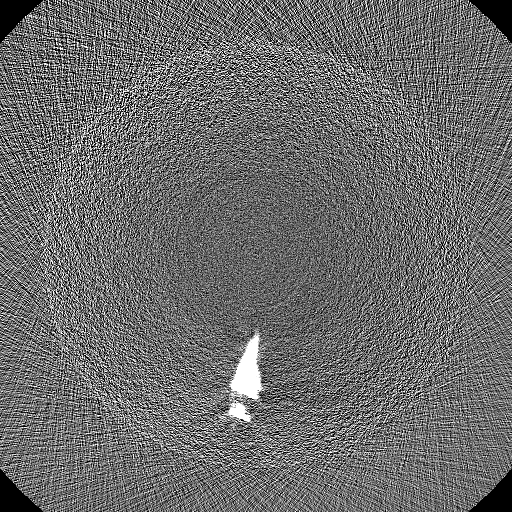
[im 114/453  soft-tissue]
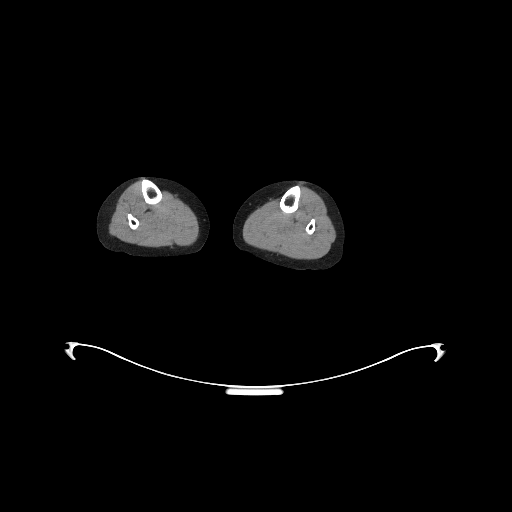
[im 227/453  soft-tissue]
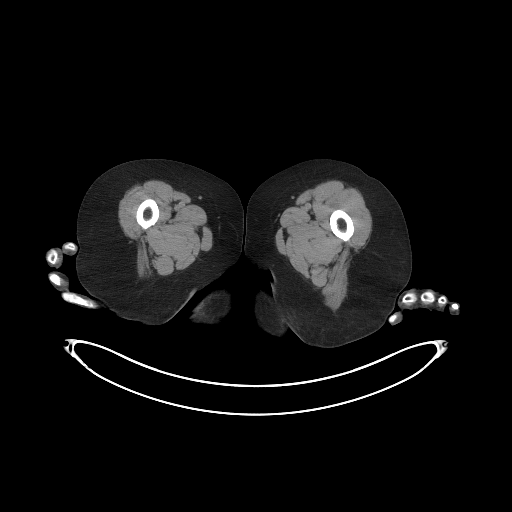
[im 340/453  soft-tissue]
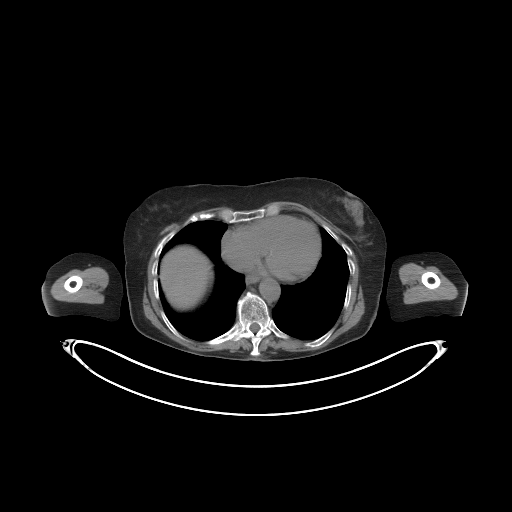
[im 453/453  soft-tissue]
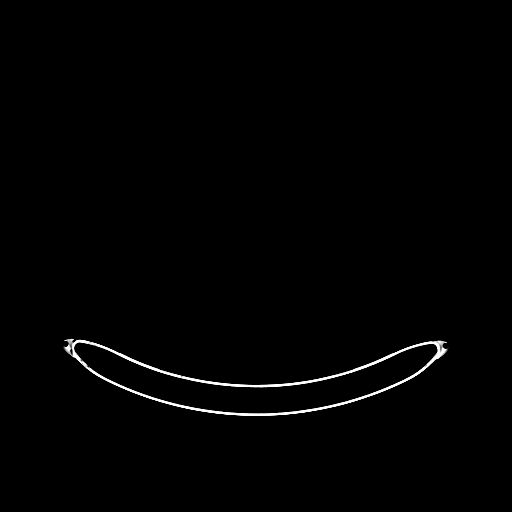

[Series 5: pet wb nac · axial · 5.0mm · 4.07mm/px · z∈[-402,+1398]mm · 6 of 451 slices shown]
[im 1/451  full-range]
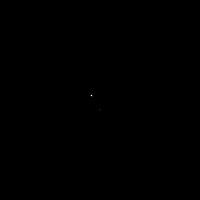
[im 91/451]
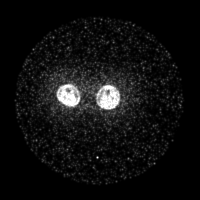
[im 181/451]
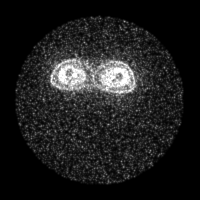
[im 271/451]
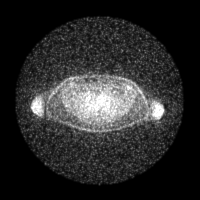
[im 361/451]
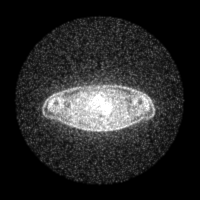
[im 451/451]
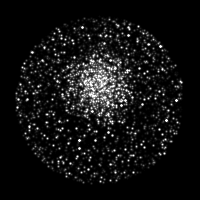

[Series 8: ct wb 5.0 br59 lung_bone · axial · 5.0mm · 0.57mm/px · 1 of 69 slices shown]
[im 1/69  lung]
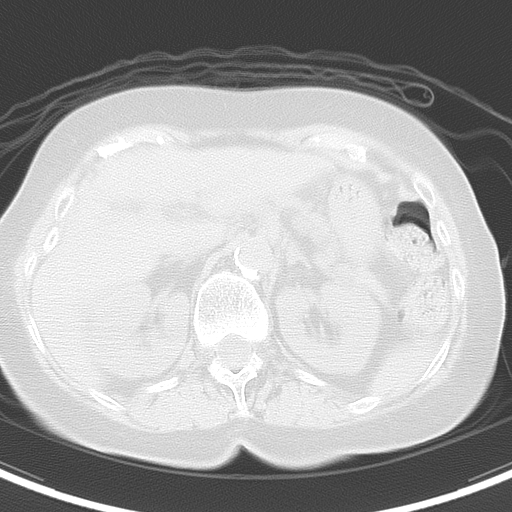

[Series 603: fused cor · 1 of 42 slices shown]
[im 1/42]
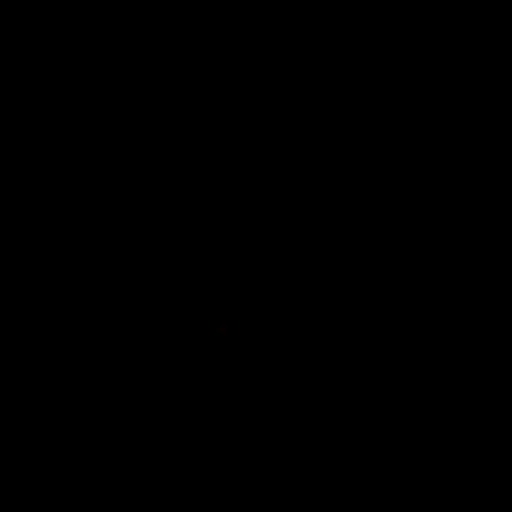

[Series 604: <mip collection> · coronal · 3.74mm/px · 1 of 32 slices shown]
[im 1/32]
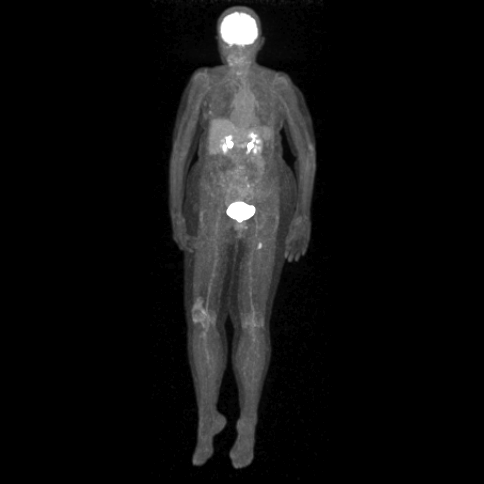

[Series 605: range-ct wb 5.0 hd_fov-tra-<alpha range> · 5 of 414 slices shown]
[im 1/414]
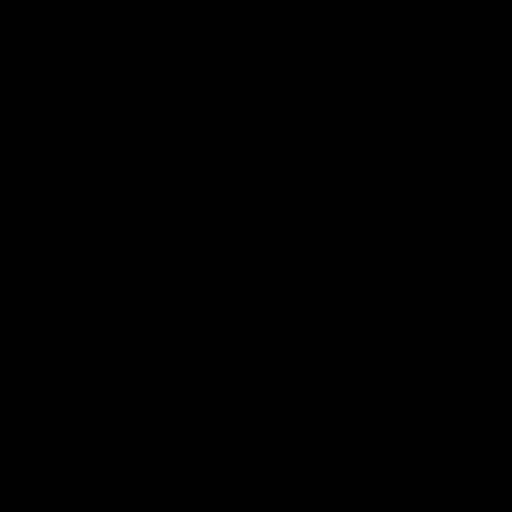
[im 104/414]
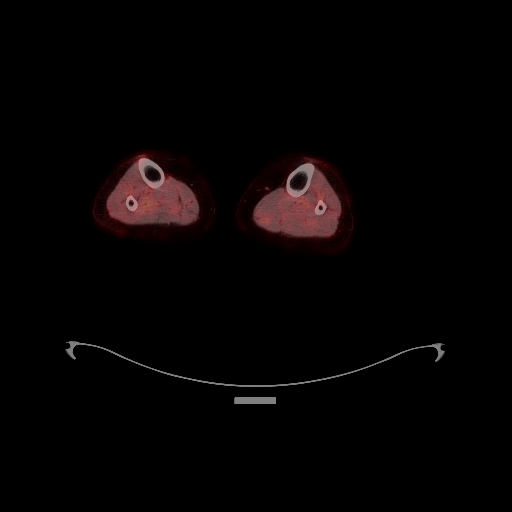
[im 207/414]
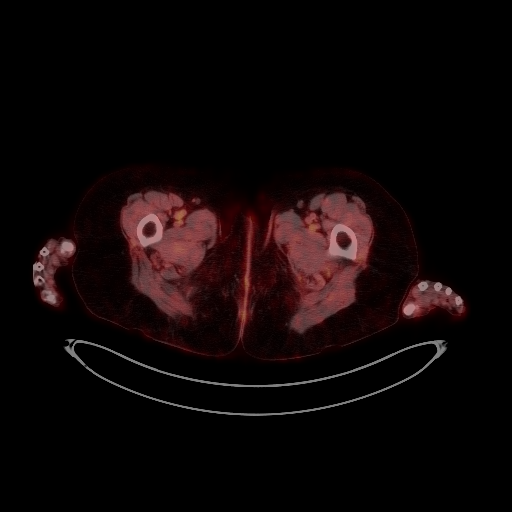
[im 310/414]
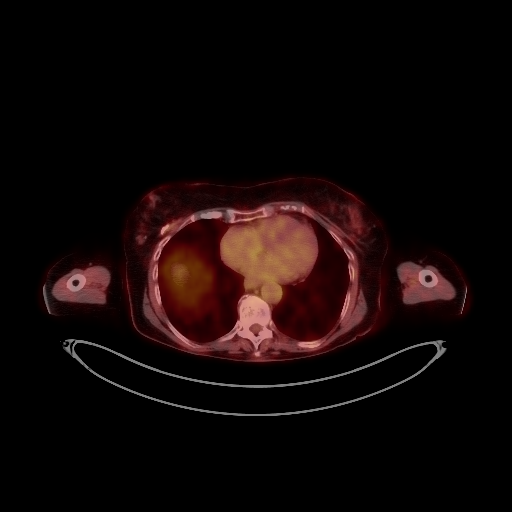
[im 414/414]
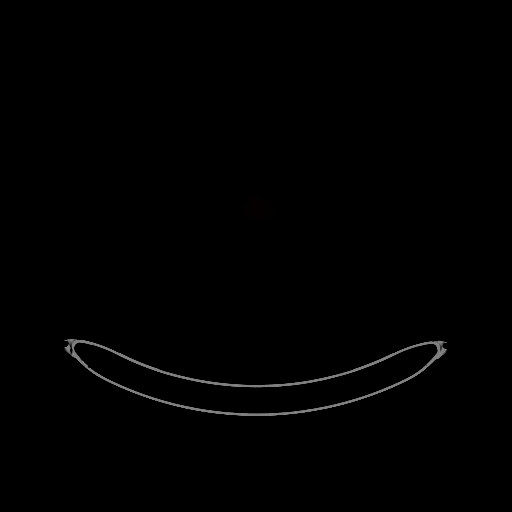

[Series 2060: results mm oncology reading · 3.0mm · 1.06mm/px · 1 of 5 slices shown]
[im 1/5]
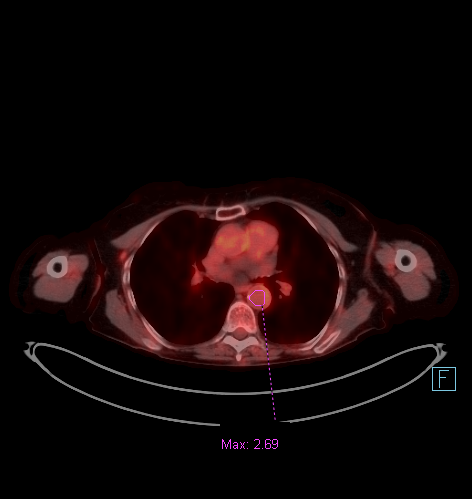

[25 of 25 positions shown; findings below may reference images not displayed]

FINDINGS: Mediastinal blood pool activity: SUV max

HEAD/NECK: No areas of abnormal hypermetabolism.

Incidental CT findings: No cervical adenopathy. Bilateral carotid
atherosclerosis. Fluid in the maxillary sinuses. Ethmoid air cell
mucosal thickening.

CHEST: Low-level hypermetabolism within the lateral right breast,
adjacent to surgical sutures. Example at a S.U.V. max of 3.0 on
106/4 versus a S.U.V. max of 2.8 on the prior. No pulmonary
parenchymal or thoracic nodal hypermetabolism.

Incidental CT findings: Aortic atherosclerosis. Radiation fibrosis
within the anterior right lung.

ABDOMEN/PELVIS: No abnormal abdominal activity. A left groin node
measures 4 mm and a S.U.V. max of 3.8 on 213/4. Compare 5 mm and a
S.U.V. max of 4.2 on the prior exam.

Incidental CT findings: Colonic stool burden suggests constipation.
Abdominal aortic atherosclerosis. Normal adrenal glands. Pelvic
floor laxity.

SKELETON: Focus of hypermetabolism about the spinous process of L5
measures a S.U.V. max of 3.9 today versus a S.U.V. max of 3.7 on the
prior. Favored to be degenerative.

Incidental CT findings: Remote lower anterior right rib fractures.

EXTREMITIES: Focus of hypermetabolism within the left posterior
thigh musculature corresponds to subtle hyperattenuation. Example
1.0 cm and a S.U.V. max of 7.0 on 231/4 versus similar in size and a
S.U.V. max of 9.2 on the prior exam.

Incidental CT findings: none
IMPRESSION: 1. Stable metastatic disease burden. Similar size and
hypermetabolism of intramuscular posterior left thigh and left groin
nodal metastasis.
2. Similar L5 spinous process hypermetabolism, favored to be
degenerative.
3. No new or progressive disease.
4. Sinus disease.

## 2020-10-31 MED ORDER — FLUDEOXYGLUCOSE F - 18 (FDG) INJECTION
7.2000 | Freq: Once | INTRAVENOUS | Status: AC | PRN
  Administered 2020-10-31: 7.2 via INTRAVENOUS

## 2020-11-08 ENCOUNTER — Inpatient Hospital Stay: Payer: Medicare Other | Attending: Oncology

## 2020-11-08 ENCOUNTER — Inpatient Hospital Stay (HOSPITAL_BASED_OUTPATIENT_CLINIC_OR_DEPARTMENT_OTHER): Payer: Medicare Other | Admitting: Oncology

## 2020-11-08 ENCOUNTER — Inpatient Hospital Stay: Payer: Medicare Other

## 2020-11-08 ENCOUNTER — Other Ambulatory Visit: Payer: Self-pay

## 2020-11-08 VITALS — BP 120/77 | HR 72 | Temp 97.0°F | Resp 18 | Ht 65.0 in | Wt 147.5 lb

## 2020-11-08 DIAGNOSIS — Z853 Personal history of malignant neoplasm of breast: Secondary | ICD-10-CM | POA: Insufficient documentation

## 2020-11-08 DIAGNOSIS — C211 Malignant neoplasm of anal canal: Secondary | ICD-10-CM

## 2020-11-08 DIAGNOSIS — Z79899 Other long term (current) drug therapy: Secondary | ICD-10-CM | POA: Diagnosis not present

## 2020-11-08 DIAGNOSIS — E039 Hypothyroidism, unspecified: Secondary | ICD-10-CM

## 2020-11-08 DIAGNOSIS — Z5112 Encounter for antineoplastic immunotherapy: Secondary | ICD-10-CM | POA: Insufficient documentation

## 2020-11-08 LAB — CBC WITH DIFFERENTIAL (CANCER CENTER ONLY)
Abs Immature Granulocytes: 0.03 10*3/uL (ref 0.00–0.07)
Basophils Absolute: 0.1 10*3/uL (ref 0.0–0.1)
Basophils Relative: 1 %
Eosinophils Absolute: 0.7 10*3/uL — ABNORMAL HIGH (ref 0.0–0.5)
Eosinophils Relative: 8 %
HCT: 39.7 % (ref 36.0–46.0)
Hemoglobin: 13.3 g/dL (ref 12.0–15.0)
Immature Granulocytes: 0 %
Lymphocytes Relative: 19 %
Lymphs Abs: 1.7 10*3/uL (ref 0.7–4.0)
MCH: 31.4 pg (ref 26.0–34.0)
MCHC: 33.5 g/dL (ref 30.0–36.0)
MCV: 93.6 fL (ref 80.0–100.0)
Monocytes Absolute: 0.8 10*3/uL (ref 0.1–1.0)
Monocytes Relative: 9 %
Neutro Abs: 5.2 10*3/uL (ref 1.7–7.7)
Neutrophils Relative %: 63 %
Platelet Count: 269 10*3/uL (ref 150–400)
RBC: 4.24 MIL/uL (ref 3.87–5.11)
RDW: 13.3 % (ref 11.5–15.5)
WBC Count: 8.5 10*3/uL (ref 4.0–10.5)
nRBC: 0 % (ref 0.0–0.2)

## 2020-11-08 LAB — CMP (CANCER CENTER ONLY)
ALT: 20 U/L (ref 0–44)
AST: 20 U/L (ref 15–41)
Albumin: 4 g/dL (ref 3.5–5.0)
Alkaline Phosphatase: 66 U/L (ref 38–126)
Anion gap: 4 — ABNORMAL LOW (ref 5–15)
BUN: 15 mg/dL (ref 8–23)
CO2: 29 mmol/L (ref 22–32)
Calcium: 9 mg/dL (ref 8.9–10.3)
Chloride: 105 mmol/L (ref 98–111)
Creatinine: 0.69 mg/dL (ref 0.44–1.00)
GFR, Estimated: >60 mL/min (ref >60)
Glucose, Bld: 93 mg/dL (ref 70–99)
Potassium: 4.8 mmol/L (ref 3.5–5.1)
Sodium: 138 mmol/L (ref 135–145)
Total Bilirubin: 0.4 mg/dL (ref 0.3–1.2)
Total Protein: 6.6 g/dL (ref 6.5–8.1)

## 2020-11-08 LAB — TSH: TSH: 2.08 u[IU]/mL (ref 0.308–3.960)

## 2020-11-08 MED ORDER — PEMBROLIZUMAB CHEMO INJECTION 100 MG/4ML
200.0000 mg | Freq: Once | INTRAVENOUS | Status: AC
  Administered 2020-11-08: 200 mg via INTRAVENOUS
  Filled 2020-11-08: qty 8

## 2020-11-08 MED ORDER — SODIUM CHLORIDE 0.9 % IV SOLN
Freq: Once | INTRAVENOUS | Status: AC
  Filled 2020-11-08: qty 250

## 2020-11-08 NOTE — Patient Instructions (Signed)
[facility] Discharge Instructions for Patients Receiving Chemotherapy  Today you received the following chemotherapy agent: Pembrolizumab.  To help prevent nausea and vomiting after your treatment, we encourage you to take your nausea medication as directed.   If you develop nausea and vomiting that is not controlled by your nausea medication, call the clinic.   BELOW ARE SYMPTOMS THAT SHOULD BE REPORTED IMMEDIATELY:  *FEVER GREATER THAN 100.5 F  *CHILLS WITH OR WITHOUT FEVER  NAUSEA AND VOMITING THAT IS NOT CONTROLLED WITH YOUR NAUSEA MEDICATION  *UNUSUAL SHORTNESS OF BREATH  *UNUSUAL BRUISING OR BLEEDING  TENDERNESS IN MOUTH AND THROAT WITH OR WITHOUT PRESENCE OF ULCERS  *URINARY PROBLEMS  *BOWEL PROBLEMS  UNUSUAL RASH Items with * indicate a potential emergency and should be followed up as soon as possible.  Feel free to call the clinic should you have any questions or concerns. The clinic phone number is [phone].  Please show the CHEMO ALERT CARD at check-in to the Emergency Department and triage nurse.   

## 2020-11-08 NOTE — Progress Notes (Signed)
Hematology and Oncology Follow Up Visit  Angela Carpenter [id number] January 10, 1941 80 y.o. 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  She was found to have stage IV disease with pelvic nodules.  Prior Therapy:   She is status post perianal excision in August 2020.  Current therapy: Pembrolizumab 200 mg every 3 weeks started on July 29, 2019.  She is here for the next cycle of therapy.  Interim History: [name] is here for return evaluation.  Since the last visit, she reports no major changes in her health.  She denies any recent hospitalizations or illnesses.  She denies any complications related to Pembrolizumab.  He denies any skin rashes or lesions.  She denies any excessive fatigue or tiredness.      Medications: Updated on review. Current Outpatient Medications  Medication Sig Dispense Refill  . acyclovir (ZOVIRAX) 400 MG tablet Take 400 mg by mouth 2 (two) times daily.    Marland Kitchen alendronate (FOSAMAX) 70 MG tablet Take 70 mg by mouth once a week. Take with a full glass of water on an empty stomach.    Marland Kitchen atorvastatin (LIPITOR) 10 MG tablet Take 1 tablet (10 mg total) by mouth daily. 90 tablet 3  . Cholecalciferol (VITAMIN D-3) 125 MCG (5000 UT) TABS Take 5,000 Units by mouth daily.     Marland Kitchen ELIQUIS 5 MG TABS tablet TAKE 1 TABLET TWICE A DAY 180 tablet 1  . metoprolol succinate (TOPROL-XL) 25 MG 24 hr tablet Take 1 tablet (25 mg total) by mouth 2 (two) times daily. 180 tablet 3  . Multiple Vitamins-Minerals (PRESERVISION AREDS 2+MULTI VIT) CAPS Take 1 capsule by mouth 2 (two) times daily.    . raloxifene (EVISTA) 60 MG tablet Take 60 mg by mouth daily.    . timolol (TIMOPTIC) 0.5 % ophthalmic solution Place 1 drop into both eyes 2 (two) times daily.     Current Facility-Administered Medications  Medication Dose Route Frequency Provider Last Rate Last Admin  . 0.9 %  sodium  chloride infusion  500 mL Intravenous Continuous Danis, Estill Cotta III, MD         Allergies: No Known Allergies     Physical Exam:  Blood pressure 120/77, pulse 72, temperature (!) 97 F (36.1 C), temperature source Tympanic, resp. rate 18, height 5\' 5"  (1.651 m), weight 147 lb 8 oz (66.9 kg), SpO2 100 %.    ECOG: 1    General appearance: Comfortable appearing without any discomfort Head: Normocephalic without any trauma Oropharynx: Mucous membranes are moist and pink without any thrush or ulcers. Eyes: Pupils are equal and round reactive to light. Lymph nodes: No cervical, supraclavicular, inguinal or axillary lymphadenopathy.   Heart:regular rate and rhythm.  S1 and S2 without leg edema. Lung: Clear without any rhonchi or wheezes.  No dullness to percussion. Abdomin: Soft, nontender, nondistended with good bowel sounds.  No hepatosplenomegaly. Musculoskeletal: No joint deformity or effusion.  Full range of motion noted. Neurological: No deficits noted on motor, sensory and deep tendon reflex exam. Skin: No petechial rash or dryness.  Appeared moist.                           Lab Results: Lab Results  Component Value Date   WBC 8.6 10/18/2020   HGB 13.8 10/18/2020   HCT 42.5 10/18/2020   MCV 95.3 10/18/2020   PLT  314 10/18/2020     Chemistry      Component Value Date/Time   NA 137 10/18/2020 1116   NA 138 12/05/2016 0750   K 4.5 10/18/2020 1116   CL 103 10/18/2020 1116   CO2 28 10/18/2020 1116   BUN 14 10/18/2020 1116   BUN 15 12/05/2016 0750   CREATININE 0.71 10/18/2020 1116   CREATININE 0.70 09/05/2015 0828      Component Value Date/Time   CALCIUM 9.2 10/18/2020 1116   ALKPHOS 70 10/18/2020 1116   AST 22 10/18/2020 1116   ALT 24 10/18/2020 1116   BILITOT 0.5 10/18/2020 1116       FINDINGS: Mediastinal blood pool activity: SUV max 2.7  HEAD/NECK: No areas of abnormal hypermetabolism.  Incidental CT findings: No cervical  adenopathy. Bilateral carotid atherosclerosis. Fluid in the maxillary sinuses. Ethmoid air cell mucosal thickening.  CHEST: Low-level hypermetabolism within the lateral right breast, adjacent to surgical sutures. Example at a S.U.V. max of 3.0 on 106/4 versus a S.U.V. max of 2.8 on the prior. No pulmonary parenchymal or thoracic nodal hypermetabolism.  Incidental CT findings: Aortic atherosclerosis. Radiation fibrosis within the anterior right lung.  ABDOMEN/PELVIS: No abnormal abdominal activity. A left groin node measures 4 mm and a S.U.V. max of 3.8 on 213/4. Compare 5 mm and a S.U.V. max of 4.2 on the prior exam.  Incidental CT findings: Colonic stool burden suggests constipation. Abdominal aortic atherosclerosis. Normal adrenal glands. Pelvic floor laxity.  SKELETON: Focus of hypermetabolism about the spinous process of L5 measures a S.U.V. max of 3.9 today versus a S.U.V. max of 3.7 on the prior. Favored to be degenerative.  Incidental CT findings: Remote lower anterior right rib fractures.  EXTREMITIES: Focus of hypermetabolism within the left posterior thigh musculature corresponds to subtle hyperattenuation. Example 1.0 cm and a S.U.V. max of 7.0 on 231/4 versus similar in size and a S.U.V. max of 9.2 on the prior exam.  Incidental CT findings: none  IMPRESSION: 1. Stable metastatic disease burden. Similar size and hypermetabolism of intramuscular posterior left thigh and left groin nodal metastasis. 2. Similar L5 spinous process hypermetabolism, favored to be degenerative. 3. No new or progressive disease. 4. Sinus disease.     Impression and Plan:  80 year old with:  1.   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  She was found to have stage IV with pelvic nodules at that time.  She is currently on Pembrolizumab without any complications.  CT scan obtained on October 26, 2020 was personally reviewed and showed very little  disease burden at this time but overall stable size and PET activity.  Treatment options moving forward were discussed which include continuing Pembrolizumab the same dose and schedule, transitioning to every 6-week schedule or a period of observation off treatment.  Risks and benefits of all these approaches were reviewed at this time.  Given her overall very low disease burden and the likelihood of a benign etiology were discussed.  Period of observation off treatment is decided on at this time and she is agreeable.   2.  Immune mediated complications: No issues reported at this time.  Complication clinic pneumonitis, colitis and thyroid disease were reviewed.   3. Dermatology surveillance: I recommended regular surveillance by dermatology.  He is up-to-date at this time.  4.  Antiemetics: No nausea or vomiting reported at this time.  Compazine is available to her.  5.  IV access: No issues with her peripheral veins noted at this  time.  6.  Breast cancer: No evidence of relapse based on her pet imaging.  We will continue to monitor.  She is up-to-date on her mammography.  7.  Follow-up: She will return in 6 weeks for clinical evaluation in 3 months for repeat imaging.  30 minutes were dedicated to this visit.  Time was spent on reviewing imaging studies, disease status update and future plan of care treatment options.   Zola Button, MD 2/2/202211:34 AM

## 2020-11-30 ENCOUNTER — Ambulatory Visit: Payer: Medicare Other | Admitting: Oncology

## 2020-11-30 ENCOUNTER — Ambulatory Visit: Payer: Medicare Other

## 2020-11-30 ENCOUNTER — Other Ambulatory Visit: Payer: Medicare Other

## 2020-12-05 DIAGNOSIS — C719 Malignant neoplasm of brain, unspecified: Secondary | ICD-10-CM

## 2020-12-05 HISTORY — DX: Malignant neoplasm of brain, unspecified: C71.9

## 2020-12-11 ENCOUNTER — Emergency Department (HOSPITAL_COMMUNITY): Payer: Medicare Other

## 2020-12-11 ENCOUNTER — Observation Stay (HOSPITAL_COMMUNITY)
Admission: EM | Admit: 2020-12-11 | Discharge: 2020-12-12 | Disposition: A | Discharge status: Home / Self Care | Payer: Medicare Other | Attending: Neurology | Admitting: Neurology

## 2020-12-11 ENCOUNTER — Inpatient Hospital Stay (HOSPITAL_COMMUNITY): Payer: Medicare Other

## 2020-12-11 ENCOUNTER — Other Ambulatory Visit: Payer: Self-pay

## 2020-12-11 DIAGNOSIS — I10 Essential (primary) hypertension: Secondary | ICD-10-CM | POA: Diagnosis not present

## 2020-12-11 DIAGNOSIS — R569 Unspecified convulsions: Secondary | ICD-10-CM

## 2020-12-11 DIAGNOSIS — G40109 Localization-related (focal) (partial) symptomatic epilepsy and epileptic syndromes with simple partial seizures, not intractable, without status epilepticus: Secondary | ICD-10-CM

## 2020-12-11 DIAGNOSIS — Z85048 Personal history of other malignant neoplasm of rectum, rectosigmoid junction, and anus: Secondary | ICD-10-CM | POA: Diagnosis not present

## 2020-12-11 DIAGNOSIS — I480 Paroxysmal atrial fibrillation: Secondary | ICD-10-CM | POA: Diagnosis not present

## 2020-12-11 DIAGNOSIS — R29810 Facial weakness: Secondary | ICD-10-CM | POA: Diagnosis not present

## 2020-12-11 DIAGNOSIS — R000 Tachycardia, unspecified: Secondary | ICD-10-CM | POA: Diagnosis not present

## 2020-12-11 DIAGNOSIS — C7931 Secondary malignant neoplasm of brain: Principal | ICD-10-CM

## 2020-12-11 DIAGNOSIS — R4020 Unspecified coma: Secondary | ICD-10-CM | POA: Diagnosis not present

## 2020-12-11 DIAGNOSIS — R55 Syncope and collapse: Secondary | ICD-10-CM | POA: Diagnosis present

## 2020-12-11 DIAGNOSIS — Z7901 Long term (current) use of anticoagulants: Secondary | ICD-10-CM | POA: Diagnosis not present

## 2020-12-11 DIAGNOSIS — Z20822 Contact with and (suspected) exposure to covid-19: Secondary | ICD-10-CM | POA: Insufficient documentation

## 2020-12-11 DIAGNOSIS — R2681 Unsteadiness on feet: Secondary | ICD-10-CM | POA: Diagnosis not present

## 2020-12-11 DIAGNOSIS — Z7722 Contact with and (suspected) exposure to environmental tobacco smoke (acute) (chronic): Secondary | ICD-10-CM | POA: Insufficient documentation

## 2020-12-11 DIAGNOSIS — I611 Nontraumatic intracerebral hemorrhage in hemisphere, cortical: Secondary | ICD-10-CM | POA: Diagnosis not present

## 2020-12-11 DIAGNOSIS — R58 Hemorrhage, not elsewhere classified: Secondary | ICD-10-CM | POA: Diagnosis not present

## 2020-12-11 DIAGNOSIS — Z853 Personal history of malignant neoplasm of breast: Secondary | ICD-10-CM | POA: Diagnosis not present

## 2020-12-11 DIAGNOSIS — I619 Nontraumatic intracerebral hemorrhage, unspecified: Secondary | ICD-10-CM | POA: Diagnosis not present

## 2020-12-11 DIAGNOSIS — Z79899 Other long term (current) drug therapy: Secondary | ICD-10-CM | POA: Diagnosis not present

## 2020-12-11 DIAGNOSIS — I6381 Other cerebral infarction due to occlusion or stenosis of small artery: Secondary | ICD-10-CM | POA: Diagnosis not present

## 2020-12-11 DIAGNOSIS — S06360A Traumatic hemorrhage of cerebrum, unspecified, without loss of consciousness, initial encounter: Secondary | ICD-10-CM | POA: Diagnosis not present

## 2020-12-11 DIAGNOSIS — R29898 Other symptoms and signs involving the musculoskeletal system: Secondary | ICD-10-CM | POA: Diagnosis not present

## 2020-12-11 DIAGNOSIS — R4781 Slurred speech: Secondary | ICD-10-CM | POA: Diagnosis not present

## 2020-12-11 LAB — CBC
HCT: 44.2 % (ref 36.0–46.0)
Hemoglobin: 14.3 g/dL (ref 12.0–15.0)
MCH: 31 pg (ref 26.0–34.0)
MCHC: 32.4 g/dL (ref 30.0–36.0)
MCV: 95.9 fL (ref 80.0–100.0)
Platelets: 334 10*3/uL (ref 150–400)
RBC: 4.61 MIL/uL (ref 3.87–5.11)
RDW: 13.3 % (ref 11.5–15.5)
WBC: 12.9 10*3/uL — ABNORMAL HIGH (ref 4.0–10.5)
nRBC: 0 % (ref 0.0–0.2)

## 2020-12-11 LAB — PROTIME-INR
INR: 1.1 (ref 0.8–1.2)
Prothrombin Time: 13.5 seconds (ref 11.4–15.2)

## 2020-12-11 LAB — I-STAT CHEM 8, ED
BUN: 20 mg/dL (ref 8–23)
Calcium, Ion: 1.02 mmol/L — ABNORMAL LOW (ref 1.15–1.40)
Chloride: 101 mmol/L (ref 98–111)
Creatinine, Ser: 0.7 mg/dL (ref 0.44–1.00)
Glucose, Bld: 118 mg/dL — ABNORMAL HIGH (ref 70–99)
HCT: 44 % (ref 36.0–46.0)
Hemoglobin: 15 g/dL (ref 12.0–15.0)
Potassium: 4.2 mmol/L (ref 3.5–5.1)
Sodium: 135 mmol/L (ref 135–145)
TCO2: 19 mmol/L — ABNORMAL LOW (ref 22–32)

## 2020-12-11 LAB — COMPREHENSIVE METABOLIC PANEL
ALT: 27 U/L (ref 0–44)
AST: 34 U/L (ref 15–41)
Albumin: 3.8 g/dL (ref 3.5–5.0)
Alkaline Phosphatase: 74 U/L (ref 38–126)
Anion gap: 16 — ABNORMAL HIGH (ref 5–15)
BUN: 17 mg/dL (ref 8–23)
CO2: 18 mmol/L — ABNORMAL LOW (ref 22–32)
Calcium: 9.4 mg/dL (ref 8.9–10.3)
Chloride: 98 mmol/L (ref 98–111)
Creatinine, Ser: 0.89 mg/dL (ref 0.44–1.00)
GFR, Estimated: >60 mL/min (ref >60)
Glucose, Bld: 119 mg/dL — ABNORMAL HIGH (ref 70–99)
Potassium: 4.3 mmol/L (ref 3.5–5.1)
Sodium: 132 mmol/L — ABNORMAL LOW (ref 135–145)
Total Bilirubin: 0.5 mg/dL (ref 0.3–1.2)
Total Protein: 6.6 g/dL (ref 6.5–8.1)

## 2020-12-11 LAB — DIFFERENTIAL
Abs Immature Granulocytes: 0.07 10*3/uL (ref 0.00–0.07)
Basophils Absolute: 0.1 10*3/uL (ref 0.0–0.1)
Basophils Relative: 1 %
Eosinophils Absolute: 0.7 10*3/uL — ABNORMAL HIGH (ref 0.0–0.5)
Eosinophils Relative: 6 %
Immature Granulocytes: 1 %
Lymphocytes Relative: 27 %
Lymphs Abs: 3.6 10*3/uL (ref 0.7–4.0)
Monocytes Absolute: 1.5 10*3/uL — ABNORMAL HIGH (ref 0.1–1.0)
Monocytes Relative: 11 %
Neutro Abs: 7 10*3/uL (ref 1.7–7.7)
Neutrophils Relative %: 54 %

## 2020-12-11 LAB — GLUCOSE, CAPILLARY: Glucose-Capillary: 138 mg/dL — ABNORMAL HIGH (ref 70–99)

## 2020-12-11 LAB — HEMOGLOBIN A1C
Hgb A1c MFr Bld: 6.1 % — ABNORMAL HIGH (ref 4.8–5.6)
Mean Plasma Glucose: 128.37 mg/dL

## 2020-12-11 LAB — APTT: aPTT: 32 seconds (ref 24–36)

## 2020-12-11 LAB — ETHANOL: Alcohol, Ethyl (B): <10 mg/dL (ref <10)

## 2020-12-11 LAB — RESP PANEL BY RT-PCR (FLU A&B, COVID) ARPGX2
Influenza A by PCR: NEGATIVE
Influenza B by PCR: NEGATIVE
SARS Coronavirus 2 by RT PCR: NEGATIVE

## 2020-12-11 LAB — CBG MONITORING, ED: Glucose-Capillary: 124 mg/dL — ABNORMAL HIGH (ref 70–99)

## 2020-12-11 IMAGING — MR MR HEAD WO/W CM
14 of 16 series · 42 of 48 positions shown · IV contrast (gadavist)
Comparison: Head CT [DATE]

CLINICAL DATA: Stroke follow-up

EXAM:
MRI HEAD WITHOUT AND WITH CONTRAST
MRA HEAD WITHOUT CONTRAST
TECHNIQUE: Multiplanar, multiecho pulse sequences of the brain and surrounding
structures were obtained without and with intravenous contrast.
Angiographic images of the head were obtained using MRA technique
without contrast.
CONTRAST:  7.5mL GADAVIST GADOBUTROL 1 MMOL/ML IV SOLN

[Series 5: DWI · axial · 3.0mm · 0.88mm/px · z∈[-132,+18]mm · 8 of 104 slices shown (1 of 4)]
[im 1/104]
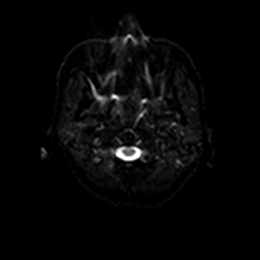
[im 15/104]
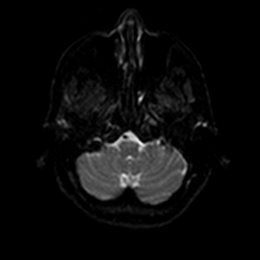
[im 30/104]
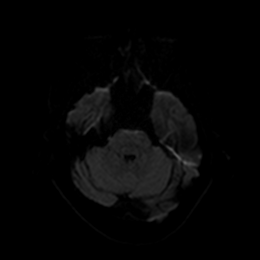
[im 45/104]
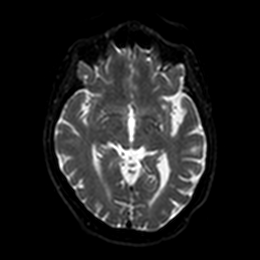
[im 59/104]
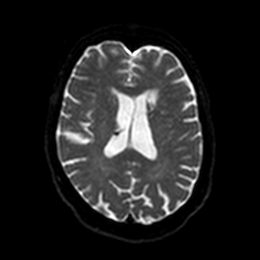
[im 74/104]
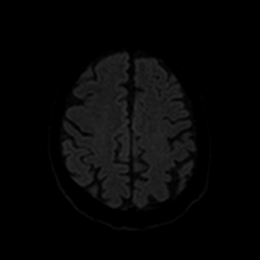
[im 89/104]
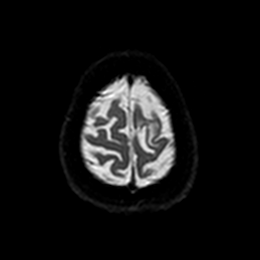
[im 104/104]
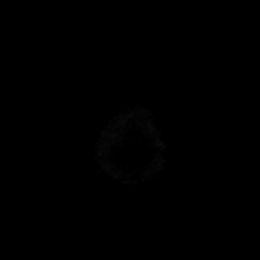

[Series 6: DWI · axial · 3.0mm · 0.88mm/px · z∈[-132,+18]mm · 3 of 52 slices shown (2 of 4)]
[im 1/52]
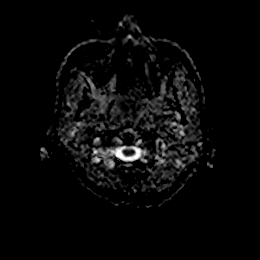
[im 26/52]
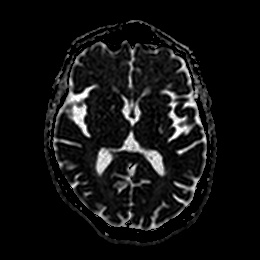
[im 52/52]
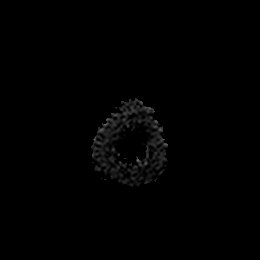

[Series 7: DWI · coronal · 4.0mm · 0.88mm/px · 5 of 68 slices shown (3 of 4)]
[im 1/68]
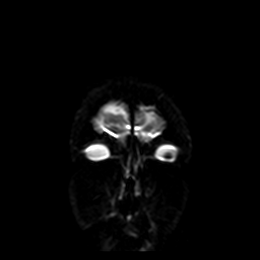
[im 17/68]
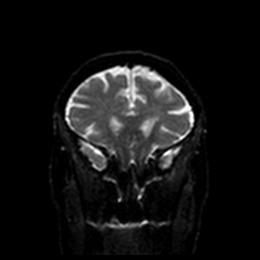
[im 34/68]
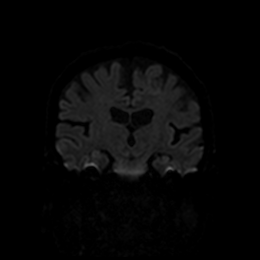
[im 51/68]
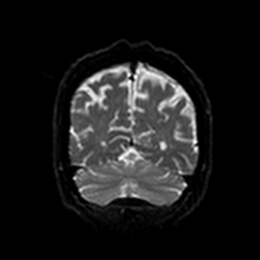
[im 68/68]
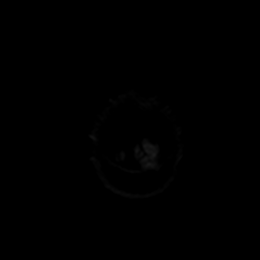

[Series 8: DWI · coronal · 4.0mm · 0.88mm/px · 2 of 34 slices shown (4 of 4)]
[im 1/34]
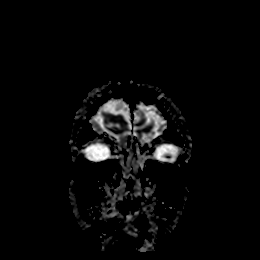
[im 34/34]
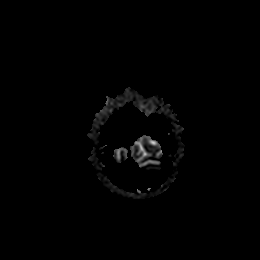

[Series 9: T1 · sagittal · 5.0mm · 0.75mm/px · 2 of 23 slices shown]
[im 1/23]
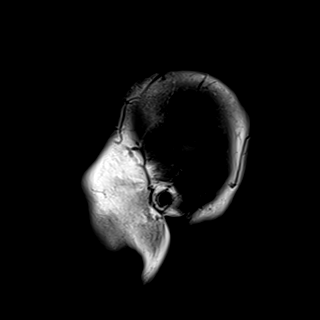
[im 23/23]
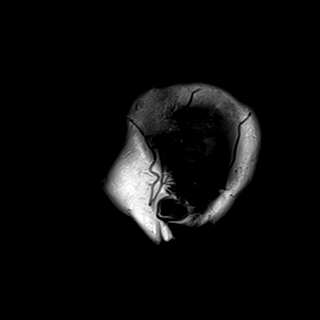

[Series 10: T2 · axial · 5.0mm · 0.72mm/px · z∈[-133,+19]mm · 2 of 27 slices shown]
[im 1/27]
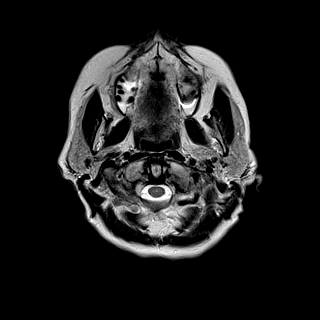
[im 27/27]
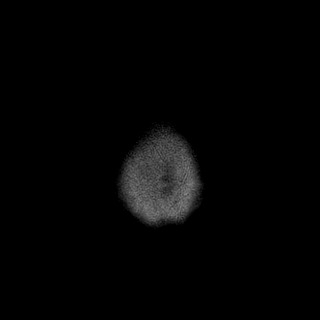

[Series 11: FLAIR · axial · 5.0mm · 0.45mm/px · z∈[-132,+20]mm · 2 of 27 slices shown]
[im 1/27]
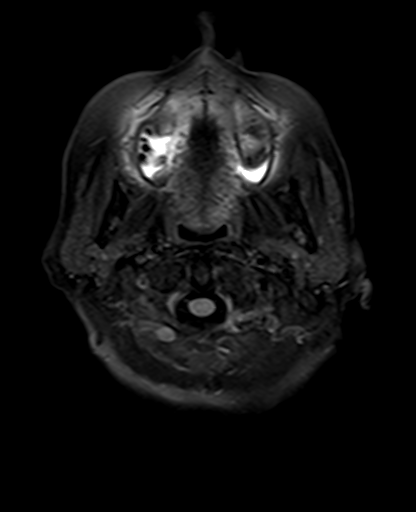
[im 27/27]
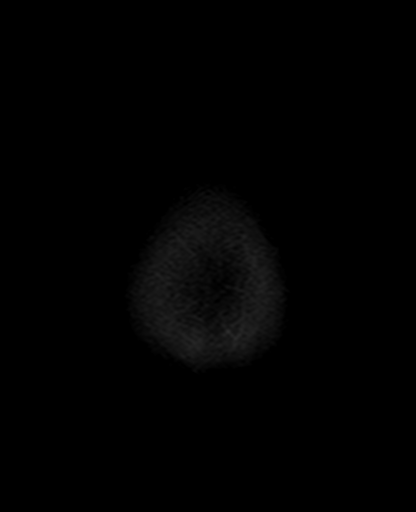

[Series 12: mag_images · axial · 3.0mm · 0.90mm/px · z∈[-131,+19]mm · 3 of 52 slices shown]
[im 1/52]
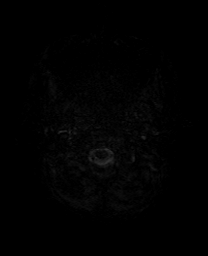
[im 26/52]
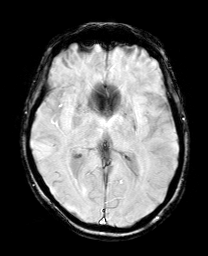
[im 52/52]
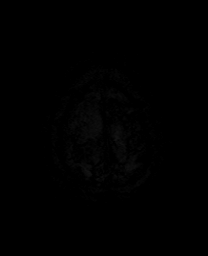

[Series 13: pha_images · axial · 3.0mm · 0.90mm/px · z∈[-131,+19]mm · 3 of 52 slices shown]
[im 1/52]
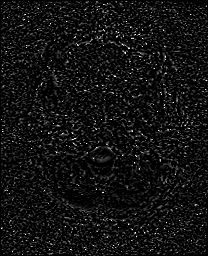
[im 26/52]
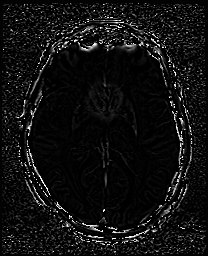
[im 52/52]
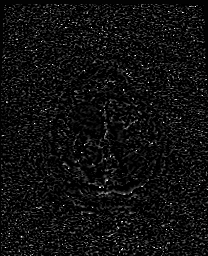

[Series 14: swi_images · axial · 3.0mm · 0.90mm/px · z∈[-131,+19]mm · 3 of 52 slices shown]
[im 1/52]
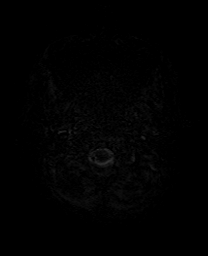
[im 26/52]
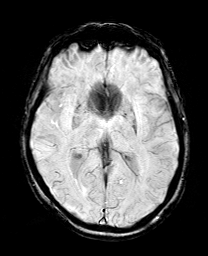
[im 52/52]
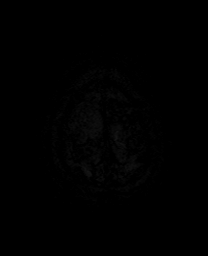

[Series 15: mip_images(sw) · axial · 24.0mm · 0.90mm/px · z∈[-120,+8]mm · 3 of 45 slices shown]
[im 1/45]
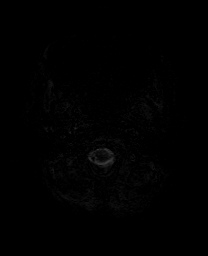
[im 23/45]
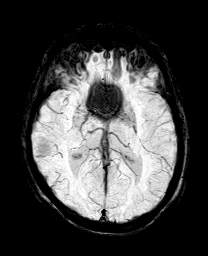
[im 45/45]
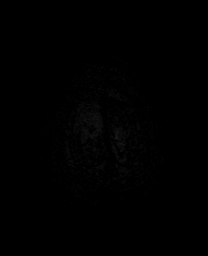

[Series 17: T2 post-contrast · coronal · 5.0mm · 0.72mm/px · 2 of 30 slices shown]
[im 1/30]
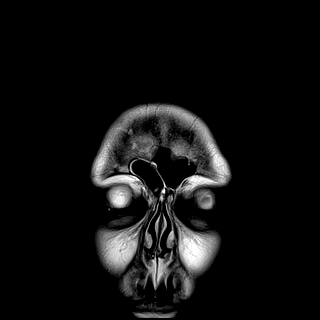
[im 30/30]
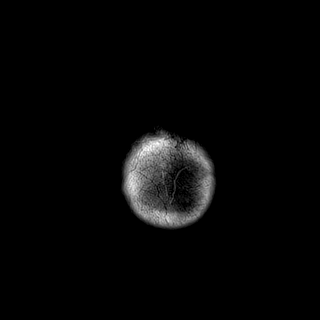

[Series 19: T1 post-contrast · coronal · 5.0mm · 0.34mm/px · 2 of 30 slices shown (1 of 2)]
[im 1/30]
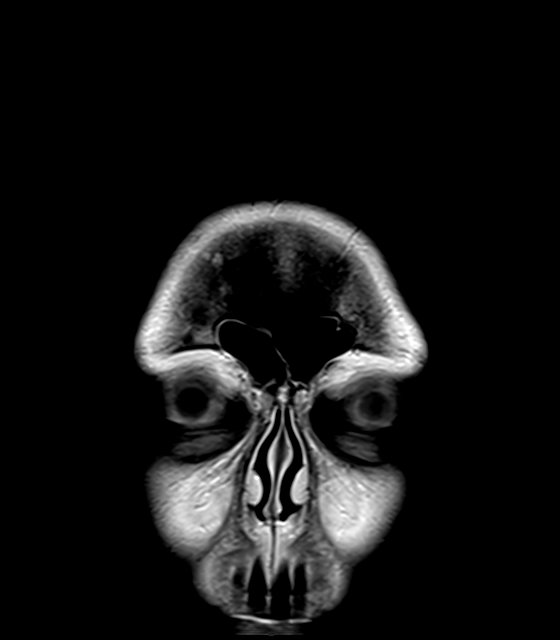
[im 30/30]
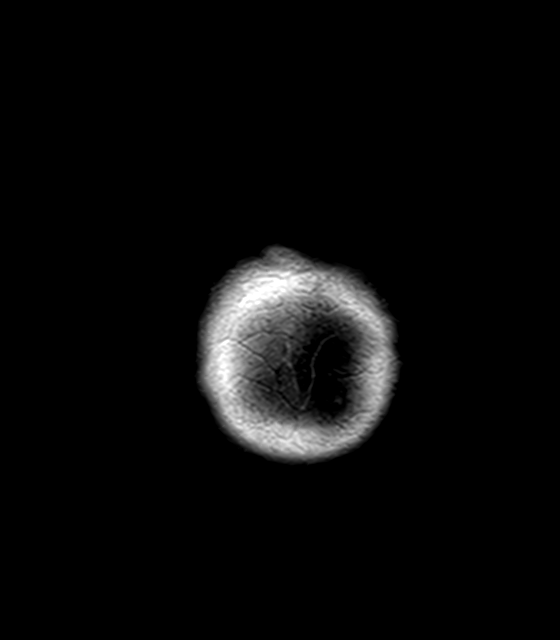

[Series 20: T1 post-contrast · sagittal · 5.0mm · 0.72mm/px · 2 of 23 slices shown (2 of 2)]
[im 1/23]
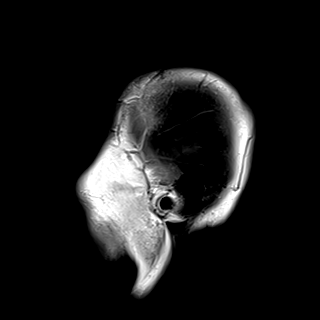
[im 23/23]
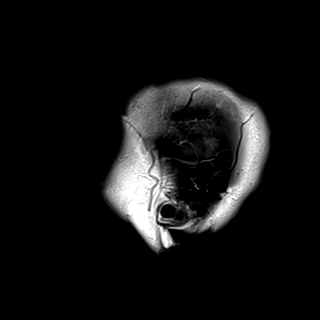

[42 of 48 positions shown; findings below may reference images not displayed]

FINDINGS: MRI HEAD FINDINGS

Brain: Unchanged area of hemorrhage in the left frontal lobe with
surrounding edema. No abnormal diffusion restriction. 19 mm contrast
enhancing lesion at the site, which also has some elements of
intrinsic hyperintense T1-weighted signal. There is multifocal
hyperintense T2-weighted signal within the white matter. Generalized
volume loss without a clear lobar predilection.

Vascular: Major flow voids are preserved.

Skull and upper cervical spine: Normal calvarium and skull base.
Visualized upper cervical spine and soft tissues are normal.

Sinuses/Orbits:No paranasal sinus fluid levels or advanced mucosal
thickening. No mastoid or middle ear effusion. Normal orbits.

MRA HEAD FINDINGS

POSTERIOR CIRCULATION:

--Vertebral arteries: Normal

--Inferior cerebellar arteries: Normal.

--Basilar artery: Normal.

--Superior cerebellar arteries: Normal.

--Posterior cerebral arteries: Normal.

ANTERIOR CIRCULATION:

--Intracranial internal carotid arteries: Normal.

--Anterior cerebral arteries (ACA): Normal.

--Middle cerebral arteries (MCA): Chronic left MCA occlusion. Normal
right.

ANATOMIC VARIANTS: Fetal origin of the right PCA. Shared origin of
the left PCA.
IMPRESSION: 1. Hemorrhagic metastasis in the left frontal lobe with surrounding
edema.
2. No acute intracranial abnormality.
3. Chronic left MCA occlusion.

## 2020-12-11 IMAGING — MR MR MRA HEAD W/O CM
1 series · 19 of 48 positions shown · IV contrast (gadavist)
Comparison: Head CT [DATE]

CLINICAL DATA: Stroke follow-up

EXAM:
MRI HEAD WITHOUT AND WITH CONTRAST
MRA HEAD WITHOUT CONTRAST
TECHNIQUE: Multiplanar, multiecho pulse sequences of the brain and surrounding
structures were obtained without and with intravenous contrast.
Angiographic images of the head were obtained using MRA technique
without contrast.
CONTRAST:  7.5mL GADAVIST GADOBUTROL 1 MMOL/ML IV SOLN

[Series 9: 3d cow · axial · 0.5mm · 0.41mm/px · z∈[-125,-45]mm · 19 of 172 slices shown]
[im 1/172]
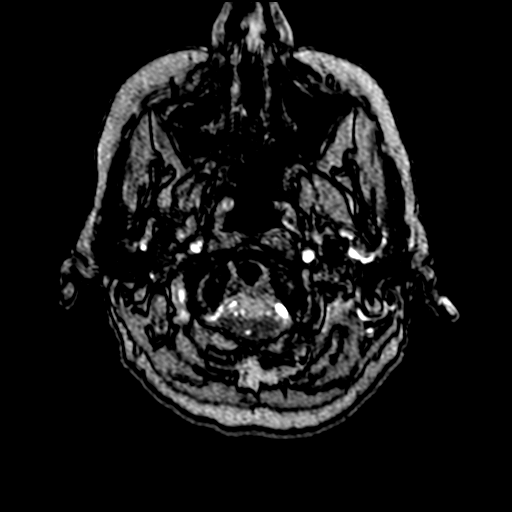
[im 4/172]
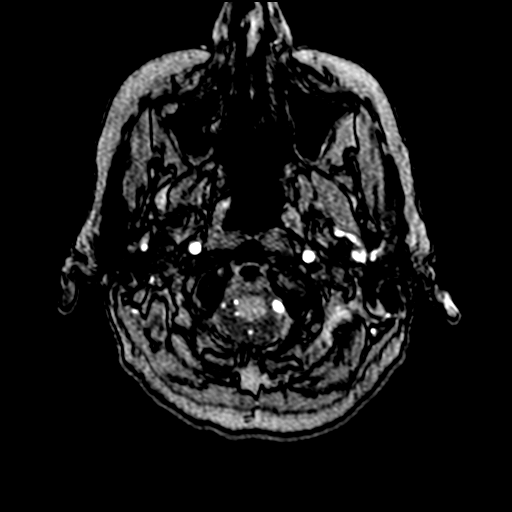
[im 8/172]
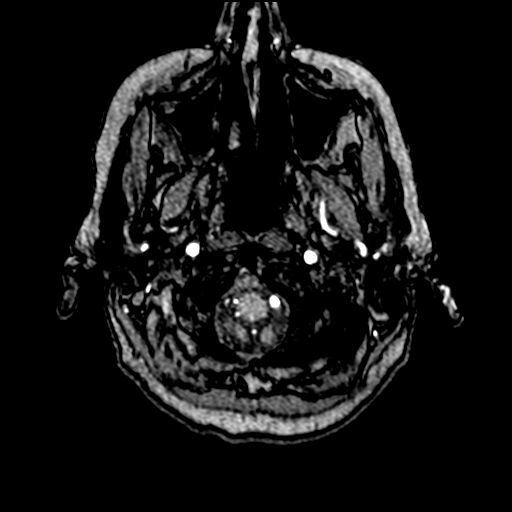
[im 11/172]
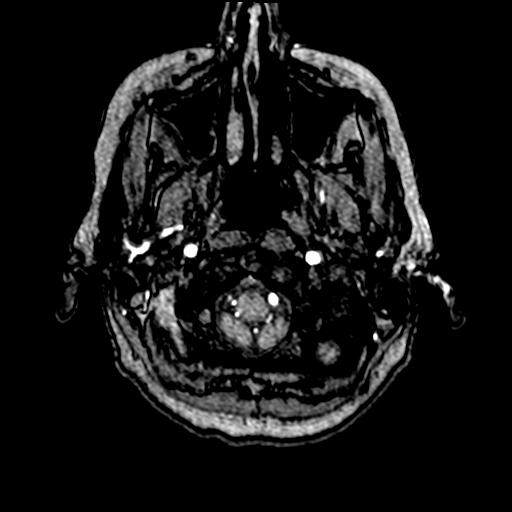
[im 15/172]
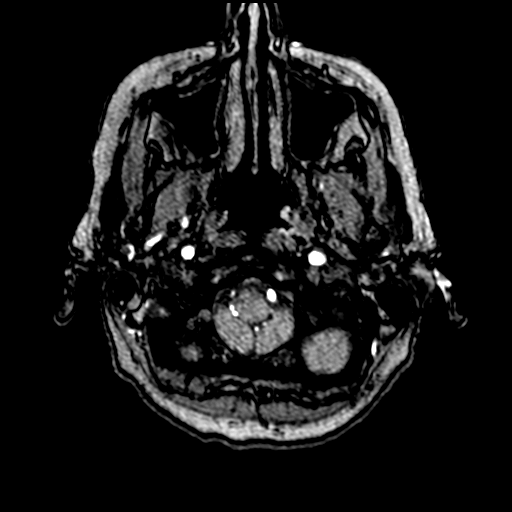
[im 19/172]
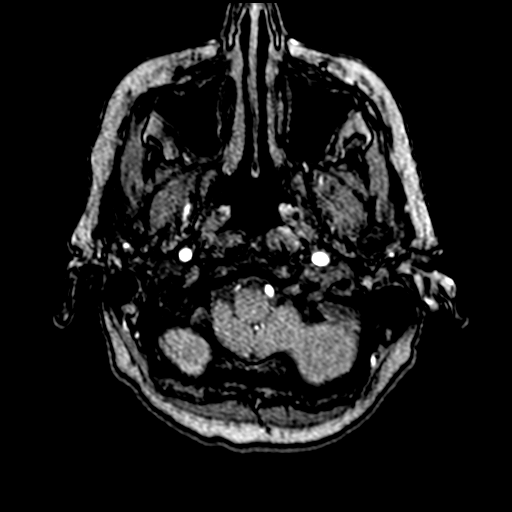
[im 22/172]
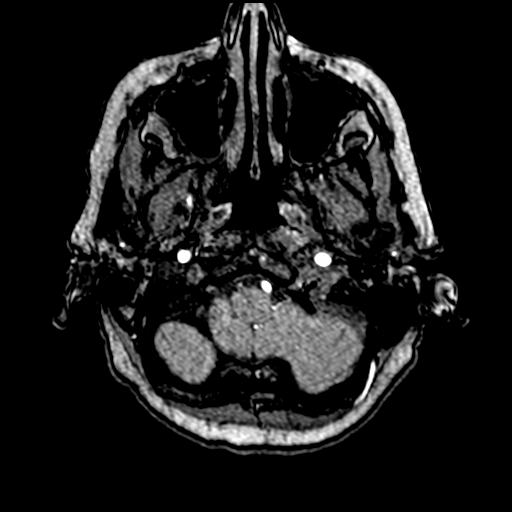
[im 26/172]
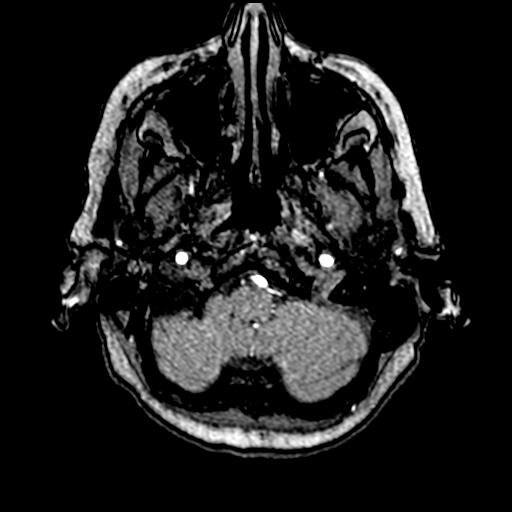
[im 30/172]
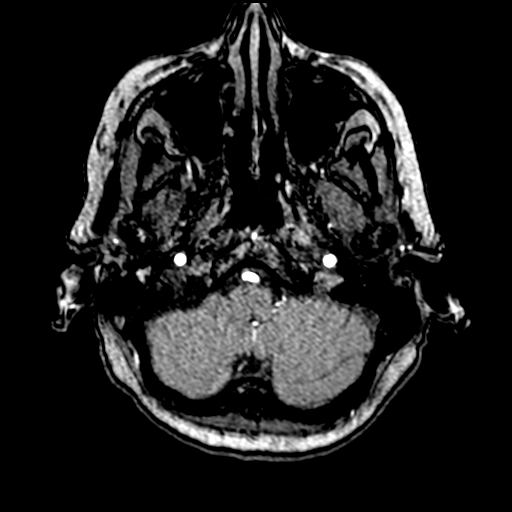
[im 33/172]
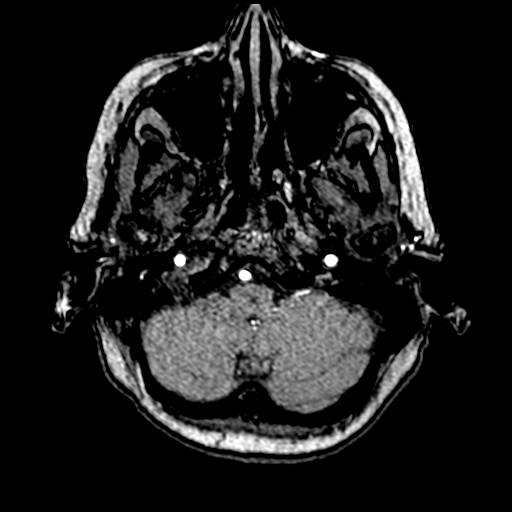
[im 37/172]
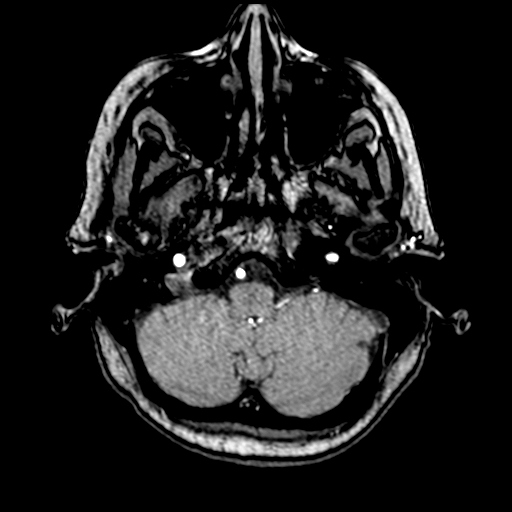
[im 55/172]
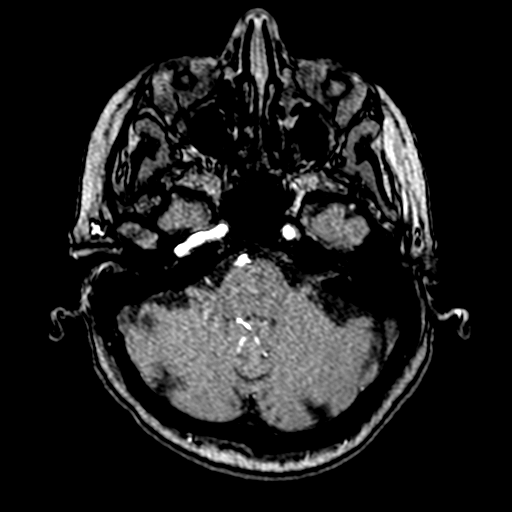
[im 77/172]
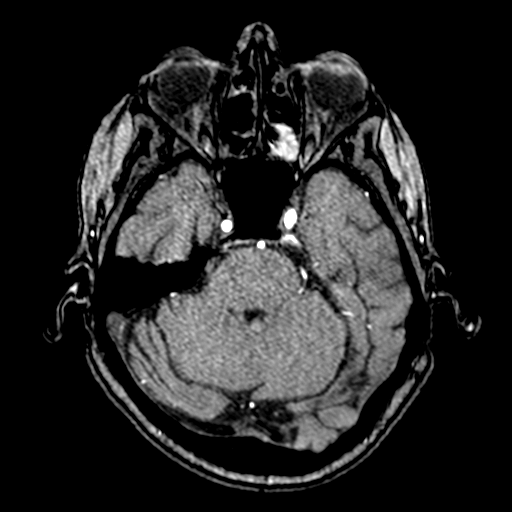
[im 88/172]
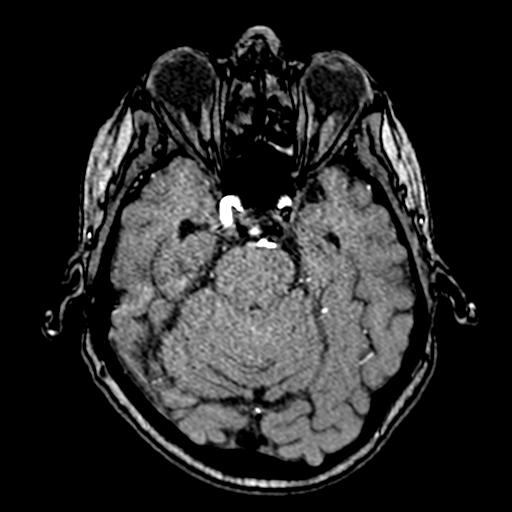
[im 99/172]
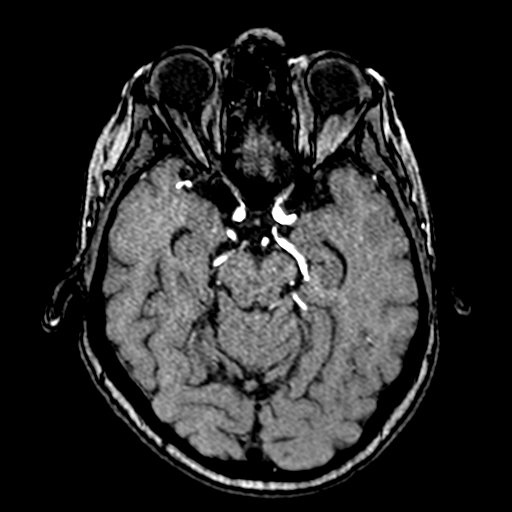
[im 121/172]
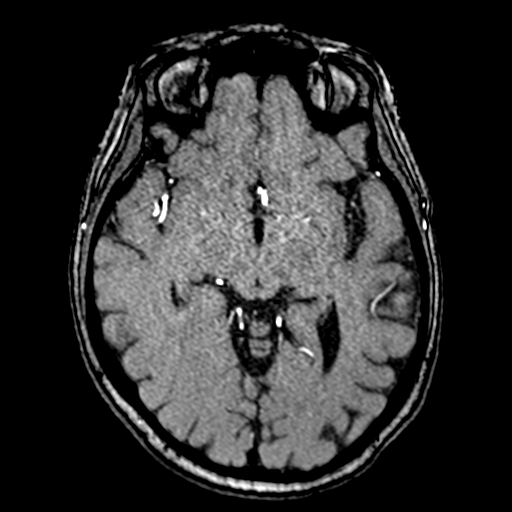
[im 142/172]
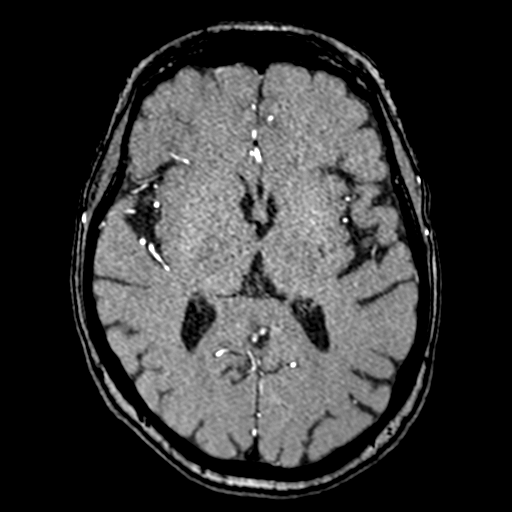
[im 146/172]
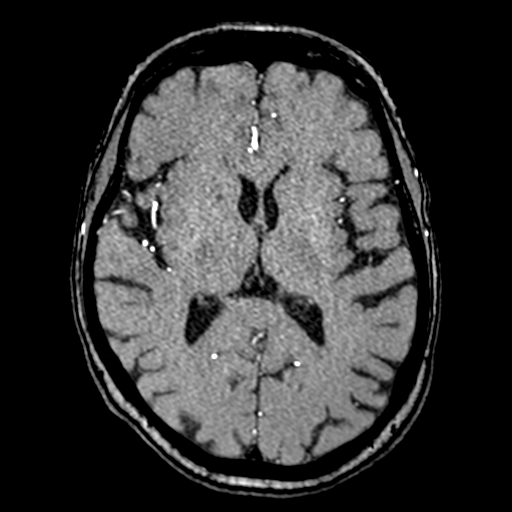
[im 164/172]
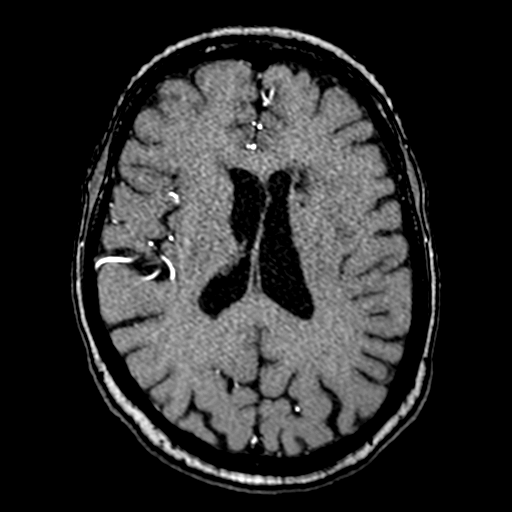

[19 of 48 positions shown; findings below may reference images not displayed]

FINDINGS: MRI HEAD FINDINGS

Brain: Unchanged area of hemorrhage in the left frontal lobe with
surrounding edema. No abnormal diffusion restriction. 19 mm contrast
enhancing lesion at the site, which also has some elements of
intrinsic hyperintense T1-weighted signal. There is multifocal
hyperintense T2-weighted signal within the white matter. Generalized
volume loss without a clear lobar predilection.

Vascular: Major flow voids are preserved.

Skull and upper cervical spine: Normal calvarium and skull base.
Visualized upper cervical spine and soft tissues are normal.

Sinuses/Orbits:No paranasal sinus fluid levels or advanced mucosal
thickening. No mastoid or middle ear effusion. Normal orbits.

MRA HEAD FINDINGS

POSTERIOR CIRCULATION:

--Vertebral arteries: Normal

--Inferior cerebellar arteries: Normal.

--Basilar artery: Normal.

--Superior cerebellar arteries: Normal.

--Posterior cerebral arteries: Normal.

ANTERIOR CIRCULATION:

--Intracranial internal carotid arteries: Normal.

--Anterior cerebral arteries (ACA): Normal.

--Middle cerebral arteries (MCA): Chronic left MCA occlusion. Normal
right.

ANATOMIC VARIANTS: Fetal origin of the right PCA. Shared origin of
the left PCA.
IMPRESSION: 1. Hemorrhagic metastasis in the left frontal lobe with surrounding
edema.
2. No acute intracranial abnormality.
3. Chronic left MCA occlusion.

## 2020-12-11 IMAGING — CT CT HEAD CODE STROKE
4 series · 16 of 47 positions shown, 18 images · non-contrast
Comparison: [DATE]

CLINICAL DATA: Code stroke. Left-sided weakness and slurred speech.
History of melanoma.

EXAM:
CT HEAD WITHOUT CONTRAST
TECHNIQUE: Contiguous axial images were obtained from the base of the skull
through the vertex without intravenous contrast.

[Series 3: head 5.0 st · axial · 0.42mm/px · z∈[-186,-66]mm · 7 of 33 slices shown, 9 images]
[im 5/33  brain]
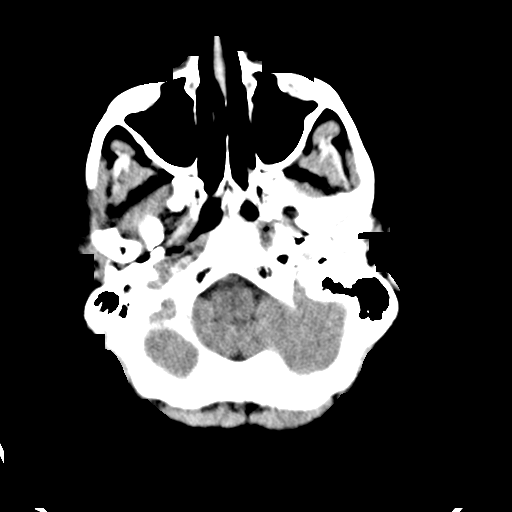
[im 5/33  bone]
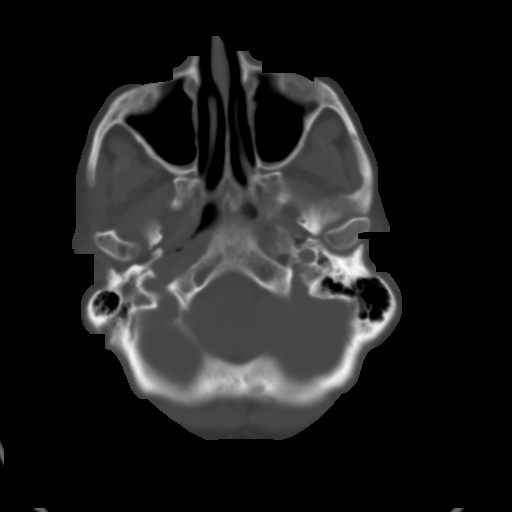
[im 9/33  brain]
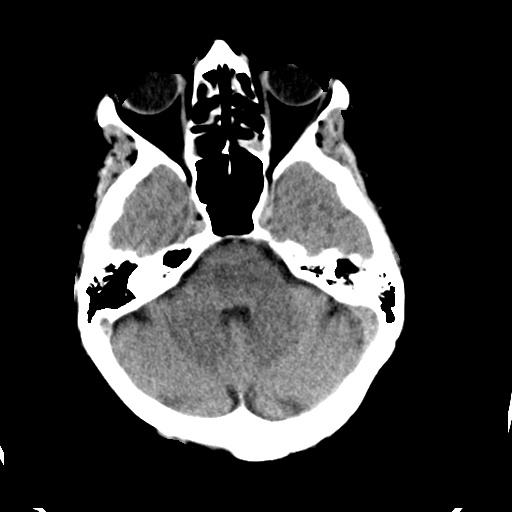
[im 13/33  brain]
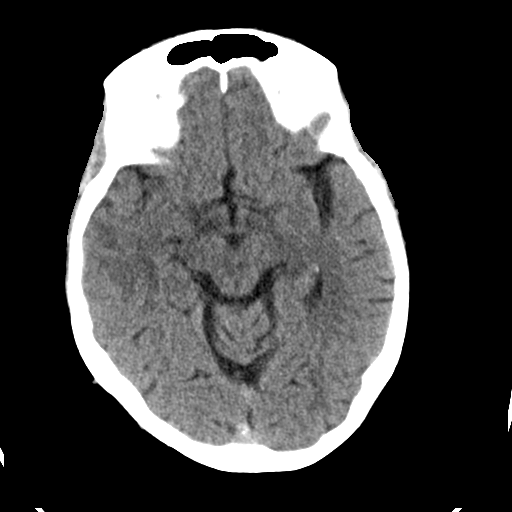
[im 17/33  brain]
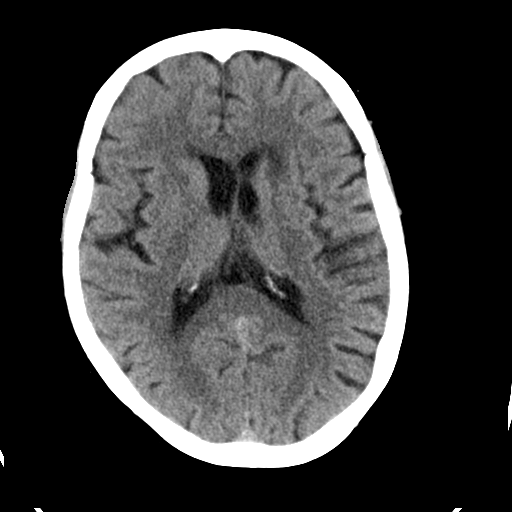
[im 21/33  brain]
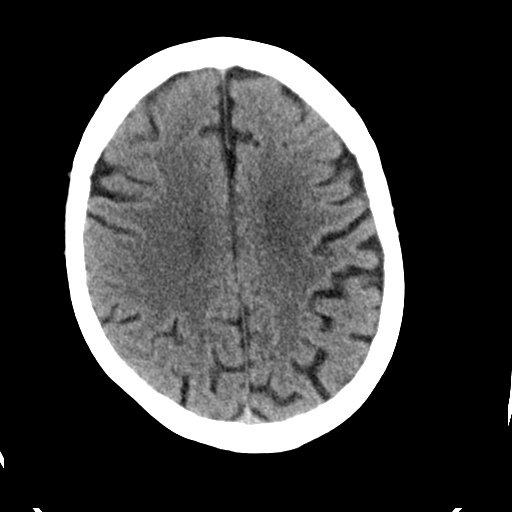
[im 21/33  bone]
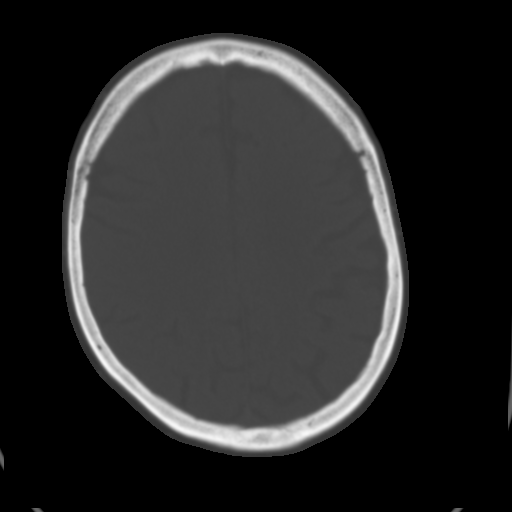
[im 25/33  brain]
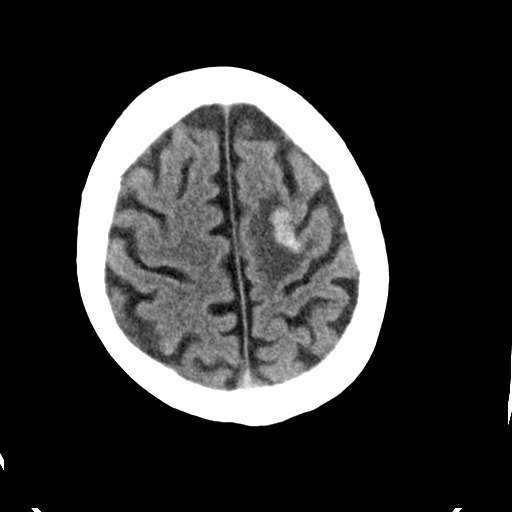
[im 29/33  brain]
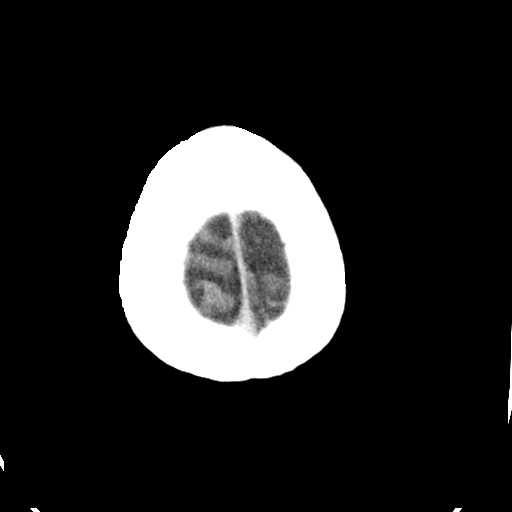

[Series 4: head 2.0 bone · axial · 0.42mm/px · z∈[-190,-158]mm · 3 of 82 slices shown]
[im 9/82  bone]
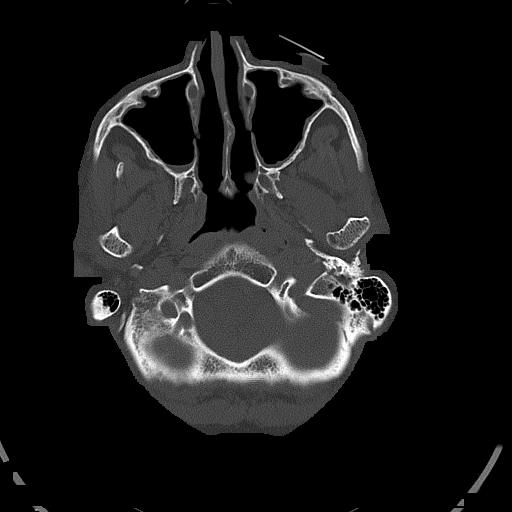
[im 17/82  bone]
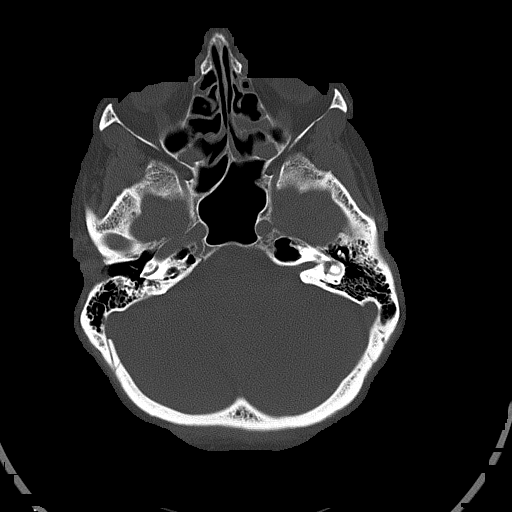
[im 25/82  bone]
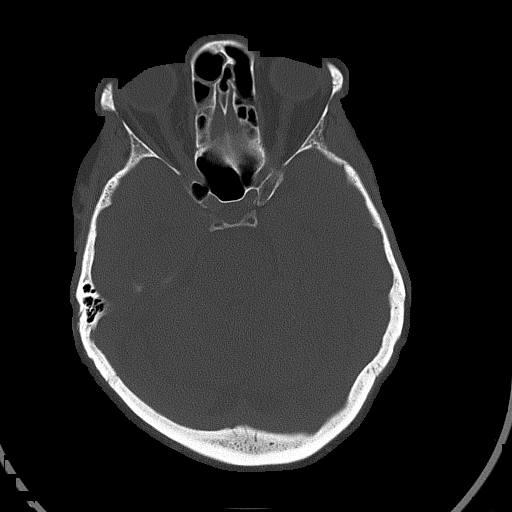

[Series 5: head 3.0 cor st · coronal · 0.32mm/px · 3 of 70 slices shown]
[im 24/70  brain]
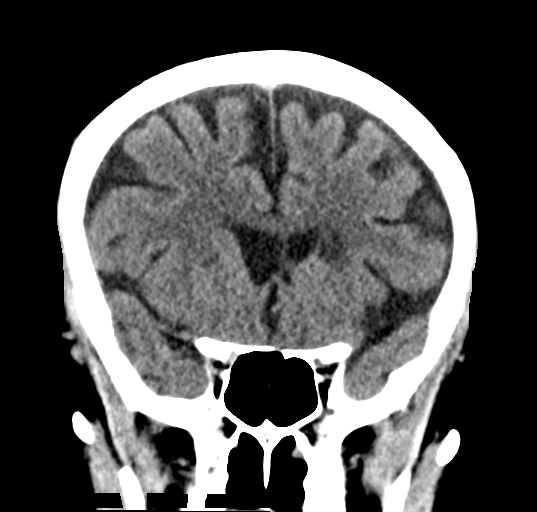
[im 31/70  brain]
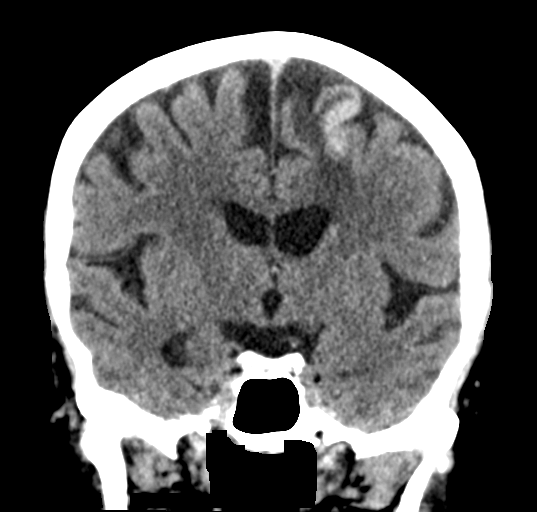
[im 39/70  brain]
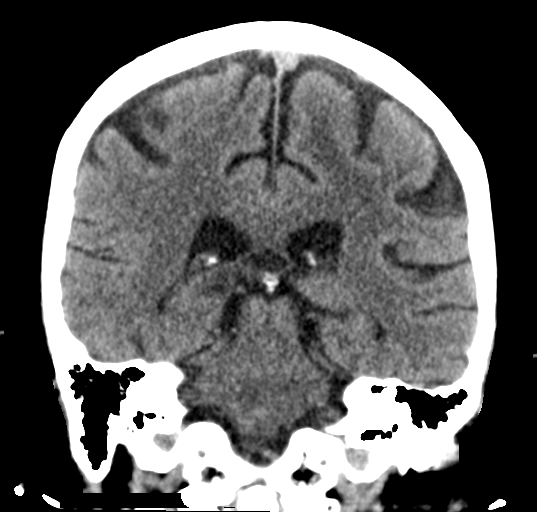

[Series 6: head 3.0 sag st · sagittal · 0.32mm/px · 3 of 58 slices shown]
[im 20/58  brain]
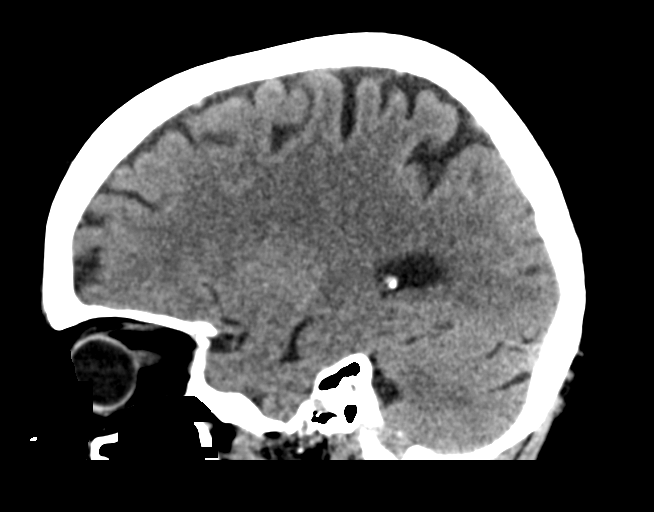
[im 29/58  brain]
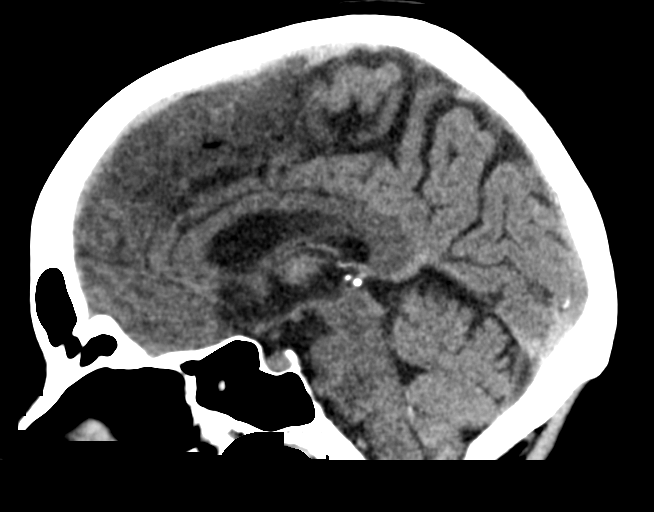
[im 39/58  brain]
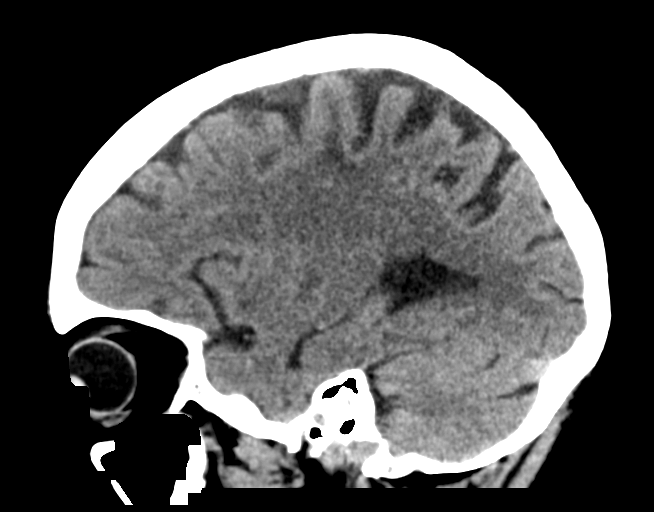

[16 of 47 positions shown; findings below may reference images not displayed]

FINDINGS: Brain: There is a 2.0 x 1.4 cm hyperdense lesion involving
cortex/gray-white junction in the high posterior left frontal lobe
most suspicious for a hemorrhagic mass with this clinical history.
Low density in the surrounding white matter is consistent with
mild-to-moderate vasogenic edema. No acute cortically based infarct
is identified separate from this lesion. Chronic lacunar infarcts
involving the left basal ganglia and periventricular white matter
are unchanged. There is no midline shift or extra-axial fluid
collection.

Vascular: Calcified atherosclerosis at the skull base. No hyperdense
vessel.

Skull: No fracture or suspicious osseous lesion.

Sinuses/Orbits: Moderate bilateral ethmoid and mild bilateral
maxillary sinus mucosal thickening. Clear mastoid air cells. Right
cataract extraction.

Other: None.

ASPECTS ([place] Stroke Program Early CT Score)

Not scored given the hemorrhagic mass.
IMPRESSION: 1. 2 cm lesion in the posterior left frontal lobe likely reflecting
a hemorrhagic melanoma metastasis. Mild-to-moderate vasogenic edema
without mass effect.
2. Chronic lacunar infarcts in the left basal ganglia and
periventricular white matter.

These results were communicated to Dr. TILAVER at [DATE] on [DATE]
by text page via the [employer] messaging system.

## 2020-12-11 MED ORDER — ATORVASTATIN CALCIUM 10 MG PO TABS: 10.0000 mg | ORAL_TABLET | Freq: Every day | ORAL | Status: DC

## 2020-12-11 MED ORDER — STROKE: EARLY STAGES OF RECOVERY BOOK
Freq: Once | Status: AC
  Filled 2020-12-11: qty 1

## 2020-12-11 MED ORDER — LEVETIRACETAM IN NACL 500 MG/100ML IV SOLN
500.0000 mg | Freq: Two times a day (BID) | INTRAVENOUS | Status: DC
  Administered 2020-12-11 – 2020-12-12 (×2): 500 mg via INTRAVENOUS
  Filled 2020-12-11 (×3): qty 100

## 2020-12-11 MED ORDER — ACETAMINOPHEN 160 MG/5ML PO SOLN: 650.0000 mg | ORAL | Status: DC | PRN

## 2020-12-11 MED ORDER — LABETALOL HCL 5 MG/ML IV SOLN: 10.0000 mg | INTRAVENOUS | Status: DC | PRN

## 2020-12-11 MED ORDER — METOPROLOL SUCCINATE ER 25 MG PO TB24
25.0000 mg | ORAL_TABLET | Freq: Two times a day (BID) | ORAL | Status: DC
  Administered 2020-12-12: 25 mg via ORAL
  Filled 2020-12-11: qty 1

## 2020-12-11 MED ORDER — EMPTY CONTAINERS FLEXIBLE MISC
900.0000 mg | Freq: Once | Status: AC
  Administered 2020-12-11: 900 mg via INTRAVENOUS
  Filled 2020-12-11: qty 90

## 2020-12-11 MED ORDER — ACETAMINOPHEN 325 MG PO TABS: 650.0000 mg | ORAL_TABLET | ORAL | Status: DC | PRN

## 2020-12-11 MED ORDER — ACETAMINOPHEN 650 MG RE SUPP: 650.0000 mg | RECTAL | Status: DC | PRN

## 2020-12-11 MED ORDER — SODIUM CHLORIDE 0.9 % IV SOLN: INTRAVENOUS | Status: AC

## 2020-12-11 MED ORDER — INSULIN ASPART 100 UNIT/ML ~~LOC~~ SOLN: 0.0000 [IU] | SUBCUTANEOUS | Status: DC

## 2020-12-11 MED ORDER — SENNOSIDES-DOCUSATE SODIUM 8.6-50 MG PO TABS
1.0000 | ORAL_TABLET | Freq: Two times a day (BID) | ORAL | Status: DC
  Filled 2020-12-11: qty 1

## 2020-12-11 MED ORDER — PROSIGHT PO TABS
1.0000 | ORAL_TABLET | Freq: Two times a day (BID) | ORAL | Status: DC
  Administered 2020-12-12: 1 via ORAL
  Filled 2020-12-11 (×2): qty 1

## 2020-12-11 MED ORDER — VITAMIN D 25 MCG (1000 UNIT) PO TABS
5000.0000 [IU] | ORAL_TABLET | Freq: Every day | ORAL | Status: DC
  Administered 2020-12-12: 5000 [IU] via ORAL
  Filled 2020-12-11: qty 5

## 2020-12-11 MED ORDER — RALOXIFENE HCL 60 MG PO TABS
60.0000 mg | ORAL_TABLET | Freq: Every day | ORAL | Status: DC
  Administered 2020-12-12: 60 mg via ORAL
  Filled 2020-12-11: qty 1

## 2020-12-11 MED ORDER — ACYCLOVIR 400 MG PO TABS
400.0000 mg | ORAL_TABLET | Freq: Two times a day (BID) | ORAL | Status: DC
  Administered 2020-12-12: 400 mg via ORAL
  Filled 2020-12-11 (×3): qty 1

## 2020-12-11 MED ORDER — PANTOPRAZOLE SODIUM 40 MG IV SOLR
40.0000 mg | Freq: Every day | INTRAVENOUS | Status: DC
  Administered 2020-12-11: 40 mg via INTRAVENOUS
  Filled 2020-12-11: qty 40

## 2020-12-11 MED ORDER — TIMOLOL MALEATE 0.5 % OP SOLN
1.0000 [drp] | Freq: Two times a day (BID) | OPHTHALMIC | Status: DC
  Filled 2020-12-11: qty 5

## 2020-12-11 MED ORDER — ACETAMINOPHEN-CODEINE #3 300-30 MG PO TABS: 1.0000 | ORAL_TABLET | ORAL | Status: DC | PRN

## 2020-12-11 NOTE — ED Notes (Signed)
4N called [phone]; unable to call report at this time,

## 2020-12-11 NOTE — Code Documentation (Signed)
Stroke Response Nurse Documentation Code Documentation  Angela Carpenter is a 80 y.o. female arriving to [place] ED via [facility] on 12/11/2020 with past medical hx of CVA, AFib and CA, . Code stroke was activated by EMS. Patient from home where she was LKW at 1620 and now complaining of slurred speech and right side weakness . On Eliquis (apixaban) daily, last taken this am. Stroke team at the bedside on patient arrival. Labs drawn and patient cleared for CT by Dr. . Patient to CT with team. NIHSS 2, see documentation for details and code stroke times. Patient with right facial droop and right leg weakness on exam. The following imaging was completed: CT. Patient is not a candidate for tPA due to taking eliquis and hemorrhage Care/Plan. Bedside handoff with ED RN William Hamburger.    Raliegh Ip  Stroke Response RN

## 2020-12-11 NOTE — Progress Notes (Signed)
EEG complete - results pending 

## 2020-12-11 NOTE — Progress Notes (Signed)
RN has undertaken a second review of Code Status with patient, who clarifies that she prefers currently to still be partial DNR, but wants all measures EXCEPT intubation.   Code Status order has been revised accordingly.   Electronically signed: Dr. Kerney Elbe

## 2020-12-11 NOTE — ED Triage Notes (Signed)
Arrived via EMS; c/o syncope about 30 mins prior to arrival (1620) witnessed by neighbors. Upon EMS arrival; noted right sided weakness + slurred speech. Hx of TIA in 2014, reported on Eliquis d/t A fib. Patient A&Ox4 on arrival to unit,

## 2020-12-11 NOTE — ED Provider Notes (Signed)
[facility] EMERGENCY DEPARTMENT Provider Note   CSN: [id number] Arrival date & time: 12/11/20  1649  LEVEL 5 CAVEAT - ACUITY OF CONDITION History Chief Complaint  Patient presents with  . Code Stroke    Angela Carpenter is a 80 y.o. female.  HPI 80 year old female presents as a code stroke. The patient was walking 2 miles when she got back to her mailbox she all of a sudden had difficulty moving her right arm. She had a syncopal episode and fell to the ground in the grass. No trauma. Right-sided weakness seems to be improving by the time she is here.   Past Medical History:  Diagnosis Date  . A-fib ([place])   . Atrial fibrillation ([place])    4 years ago started  . Breast cancer ([place]) 2006  . Colon polyps   . Glaucoma   . Hypercholesteremia   . Hypertension    patient denies   . Osteoporosis   . Personal history of chemotherapy 2006  . Personal history of radiation therapy 2006  . Stroke (cerebrum) [facility]) 2015    Patient Active Problem List   Diagnosis Date Noted  . ICH (intracerebral hemorrhage) ([place]) 12/11/2020  . Encounter for antineoplastic immunotherapy 06/30/2020  . Malignant melanoma of anal canal ([place]) 07/21/2019  . Hypertensive heart disease 12/04/2016  . HX: anticoagulation 12/04/2016  . PAF (paroxysmal atrial fibrillation) ([place]) 08/29/2015  . Hyperlipidemia 08/29/2015  . Atrial fibrillation ([place]) 08/28/2015    Past Surgical History:  Procedure Laterality Date  . AUGMENTATION MAMMAPLASTY Bilateral 2006   Patient had them removed in 2008  . BREAST BIOPSY Left 2016  . BREAST LUMPECTOMY Right 2006  . COLONOSCOPY     had polyps  . EYE SURGERY Right    cataract  . MELANOMA EXCISION WITH SENTINEL LYMPH NODE BIOPSY N/A 06/02/2019   Procedure: Left lateral ANAL MELANOMA EXCISION WITH Bilateral Groin exploration and SENTINEL node mapping;  Surgeon: [name], MD;  Location: [facility];  Service: General;  Laterality: N/A;  . TONSILLECTOMY        OB History   No obstetric history on file.     Family History  Problem Relation Age of Onset  . Lung cancer Mother   . Hypotension Mother   . Pancreatic cancer Father   . Hypertension Sister   . Kidney cancer Brother   . Cirrhosis Maternal Grandfather   . Breast cancer Paternal Grandmother   . Cirrhosis Paternal Grandfather   . Breast cancer Paternal Aunt     Social History   Tobacco Use  . Smoking status: Passive Smoke Exposure - Never Smoker  . Smokeless tobacco: Never Used  . Tobacco comment: smoked in college but didn't inhale  Vaping Use  . Vaping Use: Never used  Substance Use Topics  . Alcohol use: No    Alcohol/week: 0.0 standard drinks  . Drug use: No    Home Medications Prior to Admission medications   Medication Sig Start Date End Date Taking? Authorizing Provider  acyclovir (ZOVIRAX) 400 MG tablet Take 400 mg by mouth 2 (two) times daily.    [provider]  alendronate (FOSAMAX) 70 MG tablet Take 70 mg by mouth once a week. Take with a full glass of water on an empty stomach.    [provider]  atorvastatin (LIPITOR) 10 MG tablet Take 1 tablet (10 mg total) by mouth daily. 02/22/20   [name], MD  Cholecalciferol (VITAMIN D-3) 125 MCG (5000 UT) TABS  Take 5,000 Units by mouth daily.     [provider]  ELIQUIS 5 MG TABS tablet TAKE 1 TABLET TWICE A DAY 09/08/20   [name], MD  metoprolol succinate (TOPROL-XL) 25 MG 24 hr tablet Take 1 tablet (25 mg total) by mouth 2 (two) times daily. 02/22/20   [name], MD  Multiple Vitamins-Minerals (PRESERVISION AREDS 2+MULTI VIT) CAPS Take 1 capsule by mouth 2 (two) times daily.    [provider]  raloxifene (EVISTA) 60 MG tablet Take 60 mg by mouth daily.    [provider]  timolol (TIMOPTIC) 0.5 % ophthalmic solution Place 1 drop into both eyes 2 (two) times daily. 11/19/16   [provider]    Allergies    Patient has no known  allergies.  Review of Systems   Review of Systems  Unable to perform ROS: Acuity of condition    Physical Exam Updated Vital Signs BP 110/82   Pulse (!) 108   Resp (!) 24   Ht 5\' 5"  (1.651 m)   Wt 69.1 kg   SpO2 95%   BMI 25.35 kg/m   Physical Exam Vitals and nursing note reviewed.  Constitutional:      Appearance: She is well-developed and well-nourished.  HENT:     Head: Normocephalic and atraumatic.     Right Ear: External ear normal.     Left Ear: External ear normal.     Nose: Nose normal.  Eyes:     General:        Right eye: No discharge.        Left eye: No discharge.     Extraocular Movements: Extraocular movements intact.     Pupils: Pupils are equal, round, and reactive to light.  Cardiovascular:     Rate and Rhythm: Regular rhythm. Tachycardia present.     Heart sounds: Normal heart sounds.  Pulmonary:     Effort: Pulmonary effort is normal.     Breath sounds: Normal breath sounds.  Abdominal:     Palpations: Abdomen is soft.     Tenderness: There is no abdominal tenderness.  Skin:    General: Skin is warm and dry.  Neurological:     Mental Status: She is alert and oriented to person, place, and time.     Comments: CN 3-12 grossly intact. 5/5 strength in all 4 extremities. Grossly normal sensation.  Psychiatric:        Mood and Affect: Mood is not anxious.     ED Results / Procedures / Treatments   Labs (all labs ordered are listed, but only abnormal results are displayed) Labs Reviewed  CBC - Abnormal; Notable for the following components:      Result Value   WBC 12.9 (*)    All other components within normal limits  DIFFERENTIAL - Abnormal; Notable for the following components:   Monocytes Absolute 1.5 (*)    Eosinophils Absolute 0.7 (*)    All other components within normal limits  CBG MONITORING, ED - Abnormal; Notable for the following components:   Glucose-Capillary 124 (*)    All other components within normal limits  I-STAT CHEM  8, ED - Abnormal; Notable for the following components:   Glucose, Bld 118 (*)    Calcium, Ion 1.02 (*)    TCO2 19 (*)    All other components within normal limits  RESP PANEL BY RT-PCR (FLU A&B, COVID) ARPGX2  ETHANOL  PROTIME-INR  APTT  COMPREHENSIVE METABOLIC PANEL  RAPID URINE DRUG SCREEN, HOSP PERFORMED  URINALYSIS, ROUTINE W REFLEX MICROSCOPIC    EKG EKG Interpretation  Date/Time:  Monday December 11 2020 17:12:53 EST Ventricular Rate:  110 PR Interval:    QRS Duration: 83 QT Interval:  350 QTC Calculation: 474 R Axis:   72 Text Interpretation: Sinus tachycardia Probable left atrial enlargement RSR' in V1 or V2, probably normal variant Confirmed by Sherwood Gambler [phone]) on 12/11/2020 5:37:11 PM   Radiology CT HEAD CODE STROKE WO CONTRAST  Result Date: 12/11/2020 CLINICAL DATA:  Code stroke. Left-sided weakness and slurred speech. History of melanoma. EXAM: CT HEAD WITHOUT CONTRAST TECHNIQUE: Contiguous axial images were obtained from the base of the skull through the vertex without intravenous contrast. COMPARISON:  03/03/2016 FINDINGS: Brain: There is a 2.0 x 1.4 cm hyperdense lesion involving cortex/gray-white junction in the high posterior left frontal lobe most suspicious for a hemorrhagic mass with this clinical history. Low density in the surrounding white matter is consistent with mild-to-moderate vasogenic edema. No acute cortically based infarct is identified separate from this lesion. Chronic lacunar infarcts involving the left basal ganglia and periventricular white matter are unchanged. There is no midline shift or extra-axial fluid collection. Vascular: Calcified atherosclerosis at the skull base. No hyperdense vessel. Skull: No fracture or suspicious osseous lesion. Sinuses/Orbits: Moderate bilateral ethmoid and mild bilateral maxillary sinus mucosal thickening. Clear mastoid air cells. Right cataract extraction. Other: None. ASPECTS [facility] Stroke Program Early CT  Score) Not scored given the hemorrhagic mass. IMPRESSION: 1. 2 cm lesion in the posterior left frontal lobe likely reflecting a hemorrhagic melanoma metastasis. Mild-to-moderate vasogenic edema without mass effect. 2. Chronic lacunar infarcts in the left basal ganglia and periventricular white matter. These results were communicated to Dr. Curly Shores at 5:20 pm on 12/11/2020 by text page via the [facility] messaging system. Electronically Signed   By: Logan Bores M.D.   On: 12/11/2020 17:21    Procedures .Critical Care Performed by: Sherwood Gambler, MD Authorized by: Sherwood Gambler, MD   Critical care provider statement:    Critical care time (minutes):  30   Critical care was time spent personally by me on the following activities:  Discussions with consultants, evaluation of patient's response to treatment, examination of patient, ordering and performing treatments and interventions, ordering and review of laboratory studies, ordering and review of radiographic studies, pulse oximetry, re-evaluation of patient's condition, obtaining history from patient or surrogate and review of old charts     Medications Ordered in ED Medications  coag fact Xa recombinant (ANDEXXA) low dose infusion 900 mg (has no administration in time range)    ED Course  I have reviewed the triage vital signs and the nursing notes.  Pertinent labs & imaging results that were available during my care of the patient were reviewed by me and considered in my medical decision making (see chart for details).    MDM Rules/Calculators/A&P                          Patient presents as a code stroke. Symptoms seem to be resolved. Head CT unfortunately shows head bleed as above. This is concerning for a lesion causing this bleeding. Neuro suspect she probably had a seizure. At this point, her Eliquis will be reversed and she will be admitted to the neuro ICU. No hypertension. Final Clinical Impression(s) / ED Diagnoses Final  diagnoses:  Intraparenchymal hemorrhage of brain ([place])    Rx / DC Orders ED  Discharge Orders    None       Sherwood Gambler, MD 12/11/20 1739

## 2020-12-11 NOTE — H&P (Addendum)
Neurology H&P Reason for Consult: Code stroke Requesting Physician: Leighton Roach  History is obtained from: Patient and chart review   HPI: Angela Carpenter is a 80 y.o. female with a past medical history significant for melanoma on pembrolizumab, breast cancer in remission, s/p chemotherapy and radiation, atrial fibrillation on Eliquis (last dose this morning), hypertension, hypercholesterolemia, osteoporosis, colon polyps, prior stroke  She reports she was completely in her normal state of health today.  She went to the dentist and had a crown placed which took about 3 hours.  After that she had a mild headache which she attributed to the procedure.  She went walking around her neighborhood in the afternoon and when she got back to her mailbox she had some difficulty controlling her right arm when trying to take out the mail.  Her right arm was shaking and this began to spread to involve more of her arm.  She subsequently lost consciousness.  Neighbors saw her and activated EMS.  Regarding her prior stroke, this was in 2015.  Records are not available for review but she reports she had symptoms of sudden onset difficulty in speaking and difficulty moving one of the fingers on her right hand while she was talking to her son.  She is unsure if she was treated with TPA at the time or other advanced intervention, but she has no residual deficits.  She reports at baseline she lives alone, manages not only her own finances but also does her brothers and sisters taxes and has absolutely no functional or cognitive impairments.  Regarding her malignancy history, notably her pembrolizumab last dose was on 11/08/2020.  Due to stability of her disease on her most recent PET scan decision was made to take a pembrolizumab holiday, where she had previously been receiving it every 3 weeks.  LKW: 4:00 PM tPA given?: No due to [place] Premorbid modified rankin scale:      0 - No symptoms.    ICH Score: 1  Time  performed: 17:00  GCS: 13-15 is 0 points Infratentorial: No.. If yes, 1 point --0 Volume: <30cc is 0 points  Age: 80 y.o.. >80 is 1 point Intraventricular extension is 1 point -- 0   A Score of 1 points has a 30 day mortality of 13%. Stroke. 2001 Apr;32(4):891-7.  ROS: All other review of systems was negative except as noted in the HPI.   Past Medical History:  Diagnosis Date  . A-fib ([place])   . Atrial fibrillation ([place])    4 years ago started  . Breast cancer ([place]) 2006  . Colon polyps   . Glaucoma   . Hypercholesteremia   . Hypertension    patient denies   . Osteoporosis   . Personal history of chemotherapy 2006  . Personal history of radiation therapy 2006  . Stroke (cerebrum) ([place]) 2015    Family History  Problem Relation Age of Onset  . Lung cancer Mother   . Hypotension Mother   . Pancreatic cancer Father   . Hypertension Sister   . Kidney cancer Brother   . Cirrhosis Maternal Grandfather   . Breast cancer Paternal Grandmother   . Cirrhosis Paternal Grandfather   . Breast cancer Paternal Aunt      Social History:  reports that she is a non-smoker but has been exposed to tobacco smoke. She has never used smokeless tobacco. She reports that she does not drink alcohol and does not use drugs.   Exam: Current vital signs: There  were no vitals taken for this visit. Vital signs in last 24 hours:     Physical Exam  Constitutional: Appears well-developed and well-nourished.  Psych: Affect appropriate to situation, calm and somewhat flat Eyes: No scleral injection HENT: No oropharyngeal obstruction.  MSK: no joint deformities.  Cardiovascular: Normal rate and regular rhythm.  Respiratory: Effort normal, non-labored breathing GI: Soft.  No distension. There is no tenderness.  Skin: Warm dry and intact visible skin  Neuro: Mental Status: Patient is awake, alert, oriented to person, place, month, year, and situation. Patient is able to give a clear and  coherent history. No signs of aphasia or neglect Cranial Nerves: II: Visual Fields are full. Pupils are equal, round, and reactive to light.   III,IV, VI: EOMI without ptosis or diploplia.  V: Facial sensation is symmetric to light touch VII: Facial movement is notable for a mild right facial droop initially which resolved quickly.  VIII: hearing is intact to voice X: Uvula elevates symmetrically XI: Shoulder shrug is symmetric. XII: tongue is midline without atrophy or fasciculations.  Motor: On initial examination, she had some right upper extremity pronation without drift.  No drift of the left upper extremity.  Slight drift of the right lower extremity without hitting the bed (reports some baseline knee issues but noted this is more profound weakness than typical), left lower extremity is easily antigravity 4/5 seconds Sensory: Sensation is symmetric to light touch in the arms and legs without double sided stimulus extinction Cerebellar: FNF is intact bilaterally  NIHSS total 2 Score breakdown: One-point right facial droop, 1 point right leg drift    I have reviewed labs in epic and the results pertinent to this consultation are: Creatinine 0.89, sodium 132, white blood cell count 12.9  I have reviewed the images obtained: Head CT with acute intracranial hemorrhage and edema concerning for possible metastasis    Impression: This is a 80 year old woman who I believe had a focal seizure with secondary generalization secondary to her bleed/possible metastasis.  Given her age with ICH score of 1 and use of anticoagulation she was reversed with Andexxa, particularly given she is DNR/DNI and therefore there are more limited options for management of worsening bleed.   Assessment:    Plan: Cortical ICH, nontraumatic  Acuity: Acute Laterality: Left Current suspected etiology: Underlying mass, less likely hemorrhagic conversion of an ischemic stroke Treatment: -Admit to:  ICU -BP control goal SYS<140 -No indication for EVD at this time -PT/OT/ST  -neuromonitoring  CNS Cerebral edema Compression of brain -s/p Andexxa  -Hyperosmolar therapy not currently indicated, goal normal natremia at this time -Close neuro monitoring, every hour neurochecks -MRI brain with without contrast in 6 hours for stability scan and to evaluate for underlying mass -NPO until tomorrow, and until cleared by speech   Likely focal seizure with secondary generalization -Keppra 500 mg twice daily -Routine EEG  RESP Currently protecting her airway DNR/DNI CODE STATUS  CV -Aggressive BP control, goal SBP less than 140 which she has been meeting on her own -Titrate oral agents, labetalol IV as needed  Paroxysmal atrial -fibrillation -Rate control -Continue BB -Hold anticoagulation at this time  GI/GU -Gentle hydration, normal saline at 75 cc/h for 20 hours  HEME Known melanoma and breast cancer -MRI brain with without contrast as above to evaluate for metastases -Reach out to oncology in the morning  ENDO -every 4 hours glucose checks while n.p.o.  Fluid/Electrolyte Disorders Mild hyponatremia -Trend and IVF as above   ID  Leukocytosis on admission likely reactive -Follow-up UA -Monitor fever curve -Chest x-ray -Monitor white blood cell count  Nutrition No active issues -diet consult  Prophylaxis DVT: SCDs GI: Pantoprazole Bowel: Miralax PRN  Dispo: IP Rehab SNF LTAC Home with 24 hour supervision  Diet: NPO until cleared by speech  Code Status: DNR/DNI CODE STATUS was discussed with the patient.  She reported that given her malignancy she would not want intubation/CPR and that she has paperwork to this effect.  She does identify her son is her decision-maker but notably has not yet told him about her melanoma history that she feels he has been dealing with a lot already secondary to the pandemic.  Her sister and her brother are aware of her melanoma  and she plans to have a discussion in the next few hours with her son with her siblings present for support to update him on her history so that he can continue to be her primary healthcare decision maker should she become incapacitated.  Visitor exception should be granted for this discussion given it is a CODE STATUS discussion in a critically ill patient  THE FOLLOWING WERE PRESENT ON ADMISSION: CNS -  Seizures, Cerebral Edema, ICH, Loss of Consciousness,  Renal -  Hyponatremia,  Heme-  Coagulopathy secondary apixaban Cancer -metastatic melanoma, breast cancer DNI/DNR   Lesleigh Noe MD-PhD [facility] [phone]  Total critical care time: 72 minutes   Critical care time was exclusive of separately billable procedures and treating other patients.   Critical care was necessary to treat or prevent imminent or life-threatening deterioration.   Critical care was time spent personally by me on the following activities: development of treatment plan with patient and/or surrogate as well as nursing, discussions with consultants/primary team, evaluation of patient's response to treatment, examination of patient, obtaining history from patient or surrogate, ordering and performing treatments and interventions, ordering and review of laboratory studies, ordering and review of radiographic studies, and re-evaluation of patient's condition as needed, as documented above.

## 2020-12-12 ENCOUNTER — Other Ambulatory Visit: Payer: Self-pay | Admitting: Radiation Therapy

## 2020-12-12 DIAGNOSIS — I639 Cerebral infarction, unspecified: Secondary | ICD-10-CM | POA: Diagnosis not present

## 2020-12-12 DIAGNOSIS — C7931 Secondary malignant neoplasm of brain: Secondary | ICD-10-CM

## 2020-12-12 DIAGNOSIS — I611 Nontraumatic intracerebral hemorrhage in hemisphere, cortical: Secondary | ICD-10-CM | POA: Diagnosis not present

## 2020-12-12 DIAGNOSIS — I619 Nontraumatic intracerebral hemorrhage, unspecified: Secondary | ICD-10-CM | POA: Diagnosis not present

## 2020-12-12 DIAGNOSIS — G40109 Localization-related (focal) (partial) symptomatic epilepsy and epileptic syndromes with simple partial seizures, not intractable, without status epilepticus: Secondary | ICD-10-CM

## 2020-12-12 LAB — CBC WITH DIFFERENTIAL/PLATELET
Abs Immature Granulocytes: 0.07 10*3/uL (ref 0.00–0.07)
Basophils Absolute: 0.1 10*3/uL (ref 0.0–0.1)
Basophils Relative: 1 %
Eosinophils Absolute: 0 10*3/uL (ref 0.0–0.5)
Eosinophils Relative: 0 %
HCT: 38 % (ref 36.0–46.0)
Hemoglobin: 12.9 g/dL (ref 12.0–15.0)
Immature Granulocytes: 1 %
Lymphocytes Relative: 12 %
Lymphs Abs: 1.8 10*3/uL (ref 0.7–4.0)
MCH: 31.8 pg (ref 26.0–34.0)
MCHC: 33.9 g/dL (ref 30.0–36.0)
MCV: 93.6 fL (ref 80.0–100.0)
Monocytes Absolute: 1.1 10*3/uL — ABNORMAL HIGH (ref 0.1–1.0)
Monocytes Relative: 7 %
Neutro Abs: 11.7 10*3/uL — ABNORMAL HIGH (ref 1.7–7.7)
Neutrophils Relative %: 79 %
Platelets: 259 10*3/uL (ref 150–400)
RBC: 4.06 MIL/uL (ref 3.87–5.11)
RDW: 13.5 % (ref 11.5–15.5)
WBC: 14.8 10*3/uL — ABNORMAL HIGH (ref 4.0–10.5)
nRBC: 0 % (ref 0.0–0.2)

## 2020-12-12 LAB — BASIC METABOLIC PANEL
Anion gap: 10 (ref 5–15)
BUN: 22 mg/dL (ref 8–23)
CO2: 21 mmol/L — ABNORMAL LOW (ref 22–32)
Calcium: 8.6 mg/dL — ABNORMAL LOW (ref 8.9–10.3)
Chloride: 102 mmol/L (ref 98–111)
Creatinine, Ser: 0.65 mg/dL (ref 0.44–1.00)
GFR, Estimated: >60 mL/min (ref >60)
Glucose, Bld: 111 mg/dL — ABNORMAL HIGH (ref 70–99)
Potassium: 4 mmol/L (ref 3.5–5.1)
Sodium: 133 mmol/L — ABNORMAL LOW (ref 135–145)

## 2020-12-12 LAB — GLUCOSE, CAPILLARY
Glucose-Capillary: 122 mg/dL — ABNORMAL HIGH (ref 70–99)
Glucose-Capillary: 87 mg/dL (ref 70–99)

## 2020-12-12 LAB — MRSA PCR SCREENING: MRSA by PCR: NEGATIVE

## 2020-12-12 MED ORDER — GADOBUTROL 1 MMOL/ML IV SOLN
7.5000 mL | Freq: Once | INTRAVENOUS | Status: AC | PRN
  Administered 2020-12-12: 7.5 mL via INTRAVENOUS

## 2020-12-12 MED ORDER — LEVETIRACETAM 500 MG PO TABS: 500.0000 mg | ORAL_TABLET | Freq: Two times a day (BID) | ORAL | 0 refills | Status: DC

## 2020-12-12 MED ORDER — CHLORHEXIDINE GLUCONATE CLOTH 2 % EX PADS: 6.0000 | MEDICATED_PAD | Freq: Every day | CUTANEOUS | Status: DC

## 2020-12-12 NOTE — Evaluation (Signed)
Physical Therapy Evaluation Patient Details Name: GIANNINA BARTOLOME MRN: [id number] DOB: December 24, 1940 Today's Date: 12/12/2020   History of Present Illness  pt is a 80 y/o female admitted after losing consciousness at her mailbox post suspect seizure with loss of control of her right arm.  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  PMH:  Melanoma, breast CA in remission, s/p chemo/radiation, afib, HTN, osteoporosis, stroke.  Clinical Impression  Pt is at or close to baseline functioning and should be safe at home alone. There are no further acute PT needs.  Will sign off at this time.     Follow Up Recommendations No PT follow up    Equipment Recommendations  None recommended by PT    Recommendations for Other Services       Precautions / Restrictions Precautions Precautions: None Restrictions Weight Bearing Restrictions: No      Mobility  Bed Mobility Overal bed mobility: Independent                  Transfers Overall transfer level: Independent                  Ambulation/Gait Ambulation/Gait assistance: Independent Gait Distance (Feet): 300 Feet Assistive device: None Gait Pattern/deviations: Step-through pattern   Gait velocity interpretation: >2.62 ft/sec, indicative of community ambulatory General Gait Details: steady at age appropriate speeds.  Pt able to scan and change directions abruptly.  Stairs Stairs: Yes Stairs assistance: Modified independent (Device/Increase time) Stair Management: One rail Left;Alternating pattern;Forwards Number of Stairs: 6 General stair comments: steady with rails  Wheelchair Mobility    Modified Rankin (Stroke Patients Only) Modified Rankin (Stroke Patients Only) Pre-Morbid Rankin Score: No symptoms Modified Rankin: No symptoms     Balance Overall balance assessment: Independent                                           Pertinent Vitals/Pain Pain  Assessment: No/denies pain    Home Living Family/patient expects to be discharged to:: Private residence Living Arrangements: Alone Available Help at Discharge: Family;Available PRN/intermittently (Son comes and stays certain nights) Type of Home: House Home Access: Stairs to enter Entrance Stairs-Rails: Left;Right;Can reach both Entrance Stairs-Number of Steps: 4 Home Layout: Two level Home Equipment: Grab bars - tub/shower;Shower seat      Prior Function Level of Independence: Independent         Comments: ADLs, IADLs, driving     Hand Dominance   Dominant Hand: Right    Extremity/Trunk Assessment   Upper Extremity Assessment Upper Extremity Assessment: Overall WFL for tasks assessed    Lower Extremity Assessment Lower Extremity Assessment: Overall WFL for tasks assessed    Cervical / Trunk Assessment Cervical / Trunk Assessment: Normal  Communication   Communication: No difficulties  Cognition Arousal/Alertness: Awake/alert Behavior During Therapy: WFL for tasks assessed/performed Overall Cognitive Status: Within Functional Limits for tasks assessed                                        General Comments General comments (skin integrity, edema, etc.): HR 101, RR 16, SpO2 99%, BP 91/58    Exercises     Assessment/Plan    PT Assessment Patent does not need any further PT services  PT  Problem List         PT Treatment Interventions      PT Goals (Current goals can be found in the Care Plan section)  Acute Rehab PT Goals Patient Stated Goal: Go home please PT Goal Formulation: All assessment and education complete, DC therapy    Frequency     Barriers to discharge        Co-evaluation               AM-PAC PT "6 Clicks" Mobility  Outcome Measure Help needed turning from your back to your side while in a flat bed without using bedrails?: None Help needed moving from lying on your back to sitting on the side of a flat bed  without using bedrails?: None Help needed moving to and from a bed to a chair (including a wheelchair)?: None Help needed standing up from a chair using your arms (e.g., wheelchair or bedside chair)?: None Help needed to walk in hospital room?: None Help needed climbing 3-5 steps with a railing? : None 6 Click Score: 24    End of Session   Activity Tolerance: Patient tolerated treatment well Patient left: in bed;with call bell/phone within reach;with family/visitor present Nurse Communication: Mobility status PT Visit Diagnosis: Other abnormalities of gait and mobility (R26.89)    Time: 6073-7106 PT Time Calculation (min) (ACUTE ONLY): 20 min   Charges:   PT Evaluation $PT Eval Low Complexity: 1 Low          12/12/2020  Ginger Carne., PT Acute Rehabilitation Services [phone]  (pager) [phone]  (office)  Tessie Fass [place] 12/12/2020, 11:40 AM

## 2020-12-12 NOTE — Discharge Summary (Addendum)
Discharge Summary  Angela Carpenter JWJ:[id number] DOB: 1941/01/31  PCP: Haywood Pao, MD  Admit date: 12/11/2020 Discharge date: 12/12/2020   Time spent: 35 minutes  Admitted From: ED Disposition:  Home  Recommendations for Outpatient Follow-up:  Follow up with PCP in 2-3 weeks Follow up with oncologist (scheduled for 3/16, currently) and Dr. Mickeal Skinner (neurooncology)    Discharge Diagnoses:  Active Hospital Problems   Diagnosis Date Noted   ICH (intracerebral hemorrhage) ([place]) 12/11/2020   Brain metastasis ([place]) 12/12/2020   Seizure disorder, focal motor ([place]) 12/12/2020    Resolved Hospital Problems  No resolved problems to display.    Discharge Condition: Stable   CODE STATUS: DNI Diet recommendation: Heart healthy diet   Vitals:   12/12/20 0810 12/12/20 1000  BP: 92/64 (!) 91/58  Pulse: 77 87  Resp: (!) 21 (!) 21  Temp:    SpO2: 100% 99%    History of present illness:   Angela Carpenter is a 80 y.o. female with a past medical history significant for melanoma on pembrolizumab, breast cancer in remission, s/p chemotherapy and radiation, atrial fibrillation on Eliquis (last dose this morning), hypertension, hypercholesterolemia, osteoporosis, colon polyps, prior stroke who presented on 12/11/2020 with right-sided weakness that rapidly resolved.  She was found to have a left cortical metastasis, likely melanoma, which had had hemorrhagic conversion.  Given the propensity for melanoma to have significant hemorrhage and the fact that she was on Eliquis that she was reversed with Andexxa and admitted to the ICU for close neuro checks overnight.  The history she and EMS provided (right hand shaking, followed by loss of consciousness, followed by postevent confusion) was felt to represent a focal seizure with secondary generalization, with seizure focus felt to be the metastases identified on CT and confirmed on MRI brain.  MRI brain did not show any other concerning lesions, just  a solitary metastasis.  EEG was normal but given high clinical index of suspicion for seizure, patient was discharged on Keppra 500 mg twice daily.  Discussed case with Dr. Mickeal Skinner who will discuss the patient in tumor board as well as Dr. Alen Blew, outpatient oncologist who will follow the patient closely as well.  She remained clinically stable, with no further seizures and was tolerating Keppra very well.  She was instructed to hold Eliquis, with discussion of when/if it may be safe to resume deferred to Dr. Mickeal Skinner   Procedures/Studies:  MR ANGIO HEAD WO CONTRAST  Result Date: 12/12/2020 CLINICAL DATA:  Stroke follow-up EXAM: MRI HEAD WITHOUT AND WITH CONTRAST MRA HEAD WITHOUT CONTRAST TECHNIQUE: Multiplanar, multiecho pulse sequences of the brain and surrounding structures were obtained without and with intravenous contrast. Angiographic images of the head were obtained using MRA technique without contrast. CONTRAST:  7.44mL GADAVIST GADOBUTROL 1 MMOL/ML IV SOLN COMPARISON:  Head CT 12/11/2020 FINDINGS: MRI HEAD FINDINGS Brain: Unchanged area of hemorrhage in the left frontal lobe with surrounding edema. No abnormal diffusion restriction. 19 mm contrast enhancing lesion at the site, which also has some elements of intrinsic hyperintense T1-weighted signal. There is multifocal hyperintense T2-weighted signal within the white matter. Generalized volume loss without a clear lobar predilection. Vascular: Major flow voids are preserved. Skull and upper cervical spine: Normal calvarium and skull base. Visualized upper cervical spine and soft tissues are normal. Sinuses/Orbits:No paranasal sinus fluid levels or advanced mucosal thickening. No mastoid or middle ear effusion. Normal orbits. MRA HEAD FINDINGS POSTERIOR CIRCULATION: --Vertebral arteries: Normal --Inferior cerebellar arteries: Normal. --Basilar artery: Normal. --  Superior cerebellar arteries: Normal. --Posterior cerebral arteries: Normal. ANTERIOR  CIRCULATION: --Intracranial internal carotid arteries: Normal. --Anterior cerebral arteries (ACA): Normal. --Middle cerebral arteries (MCA): Chronic left MCA occlusion. Normal right. ANATOMIC VARIANTS: Fetal origin of the right PCA. Shared origin of the left PCA. IMPRESSION: 1. Hemorrhagic metastasis in the left frontal lobe with surrounding edema. 2. No acute intracranial abnormality. 3. Chronic left MCA occlusion. Electronically Signed   By: Ulyses Jarred M.D.   On: 12/12/2020 01:00   MR BRAIN W WO CONTRAST  Result Date: 12/12/2020 CLINICAL DATA:  Stroke follow-up EXAM: MRI HEAD WITHOUT AND WITH CONTRAST MRA HEAD WITHOUT CONTRAST TECHNIQUE: Multiplanar, multiecho pulse sequences of the brain and surrounding structures were obtained without and with intravenous contrast. Angiographic images of the head were obtained using MRA technique without contrast. CONTRAST:  7.47mL GADAVIST GADOBUTROL 1 MMOL/ML IV SOLN COMPARISON:  Head CT 12/11/2020 FINDINGS: MRI HEAD FINDINGS Brain: Unchanged area of hemorrhage in the left frontal lobe with surrounding edema. No abnormal diffusion restriction. 19 mm contrast enhancing lesion at the site, which also has some elements of intrinsic hyperintense T1-weighted signal. There is multifocal hyperintense T2-weighted signal within the white matter. Generalized volume loss without a clear lobar predilection. Vascular: Major flow voids are preserved. Skull and upper cervical spine: Normal calvarium and skull base. Visualized upper cervical spine and soft tissues are normal. Sinuses/Orbits:No paranasal sinus fluid levels or advanced mucosal thickening. No mastoid or middle ear effusion. Normal orbits. MRA HEAD FINDINGS POSTERIOR CIRCULATION: --Vertebral arteries: Normal --Inferior cerebellar arteries: Normal. --Basilar artery: Normal. --Superior cerebellar arteries: Normal. --Posterior cerebral arteries: Normal. ANTERIOR CIRCULATION: --Intracranial internal carotid arteries: Normal.  --Anterior cerebral arteries (ACA): Normal. --Middle cerebral arteries (MCA): Chronic left MCA occlusion. Normal right. ANATOMIC VARIANTS: Fetal origin of the right PCA. Shared origin of the left PCA. IMPRESSION: 1. Hemorrhagic metastasis in the left frontal lobe with surrounding edema. 2. No acute intracranial abnormality. 3. Chronic left MCA occlusion. Electronically Signed   By: Ulyses Jarred M.D.   On: 12/12/2020 01:00   EEG adult  Result Date: 12/12/2020 Lora Havens, MD     12/12/2020  8:42 AM Patient Name: Angela Carpenter MRN: [id number] Epilepsy Attending: Lora Havens Referring Physician/Provider: Dr Lesleigh Noe Date: 12/12/2020 Duration: 21.08 mins Patient history: 80yo F with right arm shaking. EEG to evaluate for seizure Level of alertness: Awake, drowsy, sleep, comatose, lethargic AEDs during EEG study: LEV Technical aspects: This EEG study was done with scalp electrodes positioned according to the 10-20 International system of electrode placement. Electrical activity was acquired at a sampling rate of 500Hz  and reviewed with a high frequency filter of 70Hz  and a low frequency filter of 1Hz . EEG data were recorded continuously and digitally stored. Description: The posterior dominant rhythm consists of 9 Hz activity of moderate voltage (25-35 uV) seen predominantly in posterior head regions, symmetric and reactive to eye opening and eye closing. Hyperventilation and photic stimulation were not performed.   IMPRESSION: This study is within normal limits. No seizures or epileptiform discharges were seen throughout the recording. Lora Havens   CT HEAD CODE STROKE WO CONTRAST  Result Date: 12/11/2020 CLINICAL DATA:  Code stroke. Left-sided weakness and slurred speech. History of melanoma. EXAM: CT HEAD WITHOUT CONTRAST TECHNIQUE: Contiguous axial images were obtained from the base of the skull through the vertex without intravenous contrast. COMPARISON:  03/03/2016 FINDINGS: Brain: There is  a 2.0 x 1.4 cm hyperdense lesion involving cortex/gray-white junction in the high posterior left frontal  lobe most suspicious for a hemorrhagic mass with this clinical history. Low density in the surrounding white matter is consistent with mild-to-moderate vasogenic edema. No acute cortically based infarct is identified separate from this lesion. Chronic lacunar infarcts involving the left basal ganglia and periventricular white matter are unchanged. There is no midline shift or extra-axial fluid collection. Vascular: Calcified atherosclerosis at the skull base. No hyperdense vessel. Skull: No fracture or suspicious osseous lesion. Sinuses/Orbits: Moderate bilateral ethmoid and mild bilateral maxillary sinus mucosal thickening. Clear mastoid air cells. Right cataract extraction. Other: None. ASPECTS [facility] Stroke Program Early CT Score) Not scored given the hemorrhagic mass. IMPRESSION: 1. 2 cm lesion in the posterior left frontal lobe likely reflecting a hemorrhagic melanoma metastasis. Mild-to-moderate vasogenic edema without mass effect. 2. Chronic lacunar infarcts in the left basal ganglia and periventricular white matter. These results were communicated to Dr. Curly Shores at 5:20 pm on 12/11/2020 by text page via the [facility] messaging system. Electronically Signed   By: Logan Bores M.D.   On: 12/11/2020 17:21     Discharge Exam: BP (!) 91/58   Pulse 87   Temp 99.5 F (37.5 C) (Oral)   Resp (!) 21   Ht 5\' 5"  (1.651 m)   Wt 69.1 kg   SpO2 99%   BMI 25.35 kg/m   Physical Exam  Constitutional: Appears well-developed and well-nourished.  Psych: Affect appropriate to situation Eyes: No scleral injection HENT: No OP obstrucion MSK: no joint deformities.  Cardiovascular: Normal rate and regular rhythm.  Respiratory: Effort normal, non-labored breathing GI: Soft.  No distension. There is no tenderness.  Skin: WDI  Neuro: Mental Status: Patient is awake, alert, oriented to person, place, month,  year, and situation. Patient is able to give a clear and coherent history. No signs of aphasia or neglect Cranial Nerves: II: Visual Fields are full. Pupils are equal, round, and reactive to light.   III,IV, VI: EOMI without ptosis or diploplia.  V: Facial sensation is symmetric to temperature VII: Facial movement is symmetric.  VIII: hearing is intact to voice X: Uvula elevates symmetrically XI: Shoulder shrug is symmetric. XII: tongue is midline without atrophy or fasciculations.  Motor: Tone is normal. Bulk is normal.  Very slight pronation of the right upper extremity without drift Sensory: Sensation is symmetric to light touch and temperature in the arms and legs. Cerebellar: FNF and HKS are intact bilaterally Gait: Casual gait is normal  NIHSS total 0  Discharge Instructions Follow up: Please make an appointment to see your primary physician for follow up within 7 days of hospital discharge.  At that appointment:  -We routinely change or add medications that can affect your baseline labs and fluid status; therefore, you may require repeat blood work or tests during your next visit with your PCP.  Your PCP may decide not to get them or may add new tests based on their clinical decision.  -Please get all medicines reviewed and adjusted.  -Please request that your primary physician go over all hospital tests and procedure/radiological results at the follow up.  Please get all hospital records sent to your physician by signing a hospital release before you go home.  Activity: As tolerated with fall precautions; use walker/cane & assistance as needed.  Disposition: Home    Diet:   Heart Healthy.  For Heart failure patients - Check your weight at the same time daily.  If you gain over 2 pounds, develop leg swelling, or experience more shortness of breath/chest pain,  call your Primary MD immediately. Follow cardiac low salt diet with no more than 1.5 liters/day of fluid.  For all  patients - If you experience worsening of your admission symptoms or develop shortness of breath, life threatening emergency, suicidal or homicidal thoughts you must seek medical attention immediately by calling 911 or calling your MD immediately.  Read complete instructions along with all the possible side effects for all the medicines you take and that have been prescribed to you. Take any new medicines after you have completely understood and accept all the possible adverse reactions/side effects.   Do not drive, operate heavy machinery, perform activities at heights, swimming or participation in water activities or provide baby sitting services if your were admitted for syncope/seizures until you have seen by Primary MD/Neurologist and advised to do so.  Do not drive when taking pain medications.    Do not take more than prescribed pain, sleep and anxiety medications.  Special Instructions: If you have smoked or chewed Tobacco  in the last 2 yrs please stop smoking; also stop any regular Alcohol and/or any Recreational drug use including marijuana.  Wear Seat belts while driving.   Please note:  You were cared for by a hospitalist during your hospital stay. If you have any questions about your discharge medications or the care you received while you were in the hospital, you can call the unit and asked to speak with the hospitalist on call. Once you are discharged, your primary care physician will handle any further medical issues. Please note that NO REFILLS for any discharge medications will be authorized, as it is imperative that you return to your primary care physician (or establish a relationship with a primary care physician if you do not have one) for your aftercare needs so that they can reassess your need for medications and monitor your lab values.  Discharge Instructions     Call MD for:   Complete by: As directed    Further seizures, For any sudden neurological change, activate  EMS   Call MD for:  temperature >100.4   Complete by: As directed    Diet - low sodium heart healthy   Complete by: As directed    Discharge instructions   Complete by: As directed    Please DO NOT take your elqiuis at this time. You will discuss when/if you can safely restart with Dr. Mickeal Skinner, neuro-oncology   Increase activity slowly   Complete by: As directed       Allergies as of 12/12/2020   No Known Allergies      Medication List     STOP taking these medications    Eliquis 5 MG Tabs tablet Generic drug: apixaban       TAKE these medications    acyclovir 400 MG tablet Commonly known as: ZOVIRAX Take 400 mg by mouth 2 (two) times daily.   alendronate 70 MG tablet Commonly known as: FOSAMAX Take 70 mg by mouth once a week. Take with a full glass of water on an empty stomach.   atorvastatin 10 MG tablet Commonly known as: LIPITOR Take 1 tablet (10 mg total) by mouth daily. What changed: when to take this   levETIRAcetam 500 MG tablet Commonly known as: Keppra Take 1 tablet (500 mg total) by mouth 2 (two) times daily.   metoprolol succinate 25 MG 24 hr tablet Commonly known as: TOPROL-XL Take 1 tablet (25 mg total) by mouth 2 (two) times daily.   PreserVision AREDS 2+Multi  Vit Caps Take 1 capsule by mouth 2 (two) times daily.   raloxifene 60 MG tablet Commonly known as: EVISTA Take 60 mg by mouth in the morning.   timolol 0.5 % ophthalmic solution Commonly known as: TIMOPTIC Place 1 drop into both eyes 2 (two) times daily.   Vitamin D-3 125 MCG (5000 UT) Tabs Take 5,000 Units by mouth daily.       No Known Allergies     The results of significant diagnostics from this hospitalization (including imaging, microbiology, ancillary and laboratory) are listed below for reference.    Significant Diagnostic Studies: MR ANGIO HEAD WO CONTRAST  Result Date: 12/12/2020 CLINICAL DATA:  Stroke follow-up EXAM: MRI HEAD WITHOUT AND WITH CONTRAST MRA HEAD  WITHOUT CONTRAST TECHNIQUE: Multiplanar, multiecho pulse sequences of the brain and surrounding structures were obtained without and with intravenous contrast. Angiographic images of the head were obtained using MRA technique without contrast. CONTRAST:  7.60mL GADAVIST GADOBUTROL 1 MMOL/ML IV SOLN COMPARISON:  Head CT 12/11/2020 FINDINGS: MRI HEAD FINDINGS Brain: Unchanged area of hemorrhage in the left frontal lobe with surrounding edema. No abnormal diffusion restriction. 19 mm contrast enhancing lesion at the site, which also has some elements of intrinsic hyperintense T1-weighted signal. There is multifocal hyperintense T2-weighted signal within the white matter. Generalized volume loss without a clear lobar predilection. Vascular: Major flow voids are preserved. Skull and upper cervical spine: Normal calvarium and skull base. Visualized upper cervical spine and soft tissues are normal. Sinuses/Orbits:No paranasal sinus fluid levels or advanced mucosal thickening. No mastoid or middle ear effusion. Normal orbits. MRA HEAD FINDINGS POSTERIOR CIRCULATION: --Vertebral arteries: Normal --Inferior cerebellar arteries: Normal. --Basilar artery: Normal. --Superior cerebellar arteries: Normal. --Posterior cerebral arteries: Normal. ANTERIOR CIRCULATION: --Intracranial internal carotid arteries: Normal. --Anterior cerebral arteries (ACA): Normal. --Middle cerebral arteries (MCA): Chronic left MCA occlusion. Normal right. ANATOMIC VARIANTS: Fetal origin of the right PCA. Shared origin of the left PCA. IMPRESSION: 1. Hemorrhagic metastasis in the left frontal lobe with surrounding edema. 2. No acute intracranial abnormality. 3. Chronic left MCA occlusion. Electronically Signed   By: Ulyses Jarred M.D.   On: 12/12/2020 01:00   MR BRAIN W WO CONTRAST  Result Date: 12/12/2020 CLINICAL DATA:  Stroke follow-up EXAM: MRI HEAD WITHOUT AND WITH CONTRAST MRA HEAD WITHOUT CONTRAST TECHNIQUE: Multiplanar, multiecho pulse sequences  of the brain and surrounding structures were obtained without and with intravenous contrast. Angiographic images of the head were obtained using MRA technique without contrast. CONTRAST:  7.66mL GADAVIST GADOBUTROL 1 MMOL/ML IV SOLN COMPARISON:  Head CT 12/11/2020 FINDINGS: MRI HEAD FINDINGS Brain: Unchanged area of hemorrhage in the left frontal lobe with surrounding edema. No abnormal diffusion restriction. 19 mm contrast enhancing lesion at the site, which also has some elements of intrinsic hyperintense T1-weighted signal. There is multifocal hyperintense T2-weighted signal within the white matter. Generalized volume loss without a clear lobar predilection. Vascular: Major flow voids are preserved. Skull and upper cervical spine: Normal calvarium and skull base. Visualized upper cervical spine and soft tissues are normal. Sinuses/Orbits:No paranasal sinus fluid levels or advanced mucosal thickening. No mastoid or middle ear effusion. Normal orbits. MRA HEAD FINDINGS POSTERIOR CIRCULATION: --Vertebral arteries: Normal --Inferior cerebellar arteries: Normal. --Basilar artery: Normal. --Superior cerebellar arteries: Normal. --Posterior cerebral arteries: Normal. ANTERIOR CIRCULATION: --Intracranial internal carotid arteries: Normal. --Anterior cerebral arteries (ACA): Normal. --Middle cerebral arteries (MCA): Chronic left MCA occlusion. Normal right. ANATOMIC VARIANTS: Fetal origin of the right PCA. Shared origin of the left PCA. IMPRESSION: 1. Hemorrhagic  metastasis in the left frontal lobe with surrounding edema. 2. No acute intracranial abnormality. 3. Chronic left MCA occlusion. Electronically Signed   By: Ulyses Jarred M.D.   On: 12/12/2020 01:00   EEG adult  Result Date: 12/12/2020 Lora Havens, MD     12/12/2020  8:42 AM Patient Name: Angela Carpenter MRN: [id number] Epilepsy Attending: Lora Havens Referring Physician/Provider: Dr Lesleigh Noe Date: 12/12/2020 Duration: 21.08 mins Patient history: 80yo  F with right arm shaking. EEG to evaluate for seizure Level of alertness: Awake, drowsy, sleep, comatose, lethargic AEDs during EEG study: LEV Technical aspects: This EEG study was done with scalp electrodes positioned according to the 10-20 International system of electrode placement. Electrical activity was acquired at a sampling rate of 500Hz  and reviewed with a high frequency filter of 70Hz  and a low frequency filter of 1Hz . EEG data were recorded continuously and digitally stored. Description: The posterior dominant rhythm consists of 9 Hz activity of moderate voltage (25-35 uV) seen predominantly in posterior head regions, symmetric and reactive to eye opening and eye closing. Hyperventilation and photic stimulation were not performed.   IMPRESSION: This study is within normal limits. No seizures or epileptiform discharges were seen throughout the recording. Lora Havens   CT HEAD CODE STROKE WO CONTRAST  Result Date: 12/11/2020 CLINICAL DATA:  Code stroke. Left-sided weakness and slurred speech. History of melanoma. EXAM: CT HEAD WITHOUT CONTRAST TECHNIQUE: Contiguous axial images were obtained from the base of the skull through the vertex without intravenous contrast. COMPARISON:  03/03/2016 FINDINGS: Brain: There is a 2.0 x 1.4 cm hyperdense lesion involving cortex/gray-white junction in the high posterior left frontal lobe most suspicious for a hemorrhagic mass with this clinical history. Low density in the surrounding white matter is consistent with mild-to-moderate vasogenic edema. No acute cortically based infarct is identified separate from this lesion. Chronic lacunar infarcts involving the left basal ganglia and periventricular white matter are unchanged. There is no midline shift or extra-axial fluid collection. Vascular: Calcified atherosclerosis at the skull base. No hyperdense vessel. Skull: No fracture or suspicious osseous lesion. Sinuses/Orbits: Moderate bilateral ethmoid and mild  bilateral maxillary sinus mucosal thickening. Clear mastoid air cells. Right cataract extraction. Other: None. ASPECTS [facility] Stroke Program Early CT Score) Not scored given the hemorrhagic mass. IMPRESSION: 1. 2 cm lesion in the posterior left frontal lobe likely reflecting a hemorrhagic melanoma metastasis. Mild-to-moderate vasogenic edema without mass effect. 2. Chronic lacunar infarcts in the left basal ganglia and periventricular white matter. These results were communicated to Dr. Curly Shores at 5:20 pm on 12/11/2020 by text page via the [employer] messaging system. Electronically Signed   By: Logan Bores M.D.   On: 12/11/2020 17:21     Microbiology: Recent Results (from the past 240 hour(s))  MRSA PCR Screening     Status: None   Collection Time: 12/11/20  1:37 AM   Specimen: Nasal Mucosa; Nasopharyngeal  Result Value Ref Range Status   MRSA by PCR NEGATIVE NEGATIVE Final    Comment:        The GeneXpert MRSA Assay (FDA approved for NASAL specimens only), is one component of a comprehensive MRSA colonization surveillance program. It is not intended to diagnose MRSA infection nor to guide or monitor treatment for MRSA infections. Performed at [facility], [place] [address], [place], [place] [address]   Resp Panel by RT-PCR (Flu A&B, Covid) Nasopharyngeal Swab     Status: None   Collection Time: 12/11/20  5:32  PM   Specimen: Nasopharyngeal Swab; Nasopharyngeal(NP) swabs in vial transport medium  Result Value Ref Range Status   SARS Coronavirus 2 by RT PCR NEGATIVE NEGATIVE Final    Comment: (NOTE) SARS-CoV-2 target nucleic acids are NOT DETECTED.  The SARS-CoV-2 RNA is generally detectable in upper respiratory specimens during the acute phase of infection. The lowest concentration of SARS-CoV-2 viral copies this assay can detect is 138 copies/mL. A negative result does not preclude SARS-Cov-2 infection and should not be used as the sole basis for treatment or other patient  management decisions. A negative result may occur with  improper specimen collection/handling, submission of specimen other than nasopharyngeal swab, presence of viral mutation(s) within the areas targeted by this assay, and inadequate number of viral copies(<138 copies/mL). A negative result must be combined with clinical observations, patient history, and epidemiological information. The expected result is Negative.  Fact Sheet for Patients:  EntrepreneurPulse.com.au  Fact Sheet for Healthcare Providers:  IncredibleEmployment.be  This test is no t yet approved or cleared by the [place] FDA and  has been authorized for detection and/or diagnosis of SARS-CoV-2 by FDA under an Emergency Use Authorization (EUA). This EUA will remain  in effect (meaning this test can be used) for the duration of the COVID-19 declaration under Section 564(b)(1) of the Act, 21 U.S.C.section 360bbb-3(b)(1), unless the authorization is terminated  or revoked sooner.       Influenza A by PCR NEGATIVE NEGATIVE Final   Influenza B by PCR NEGATIVE NEGATIVE Final    Comment: (NOTE) The Xpert Xpress SARS-CoV-2/FLU/RSV plus assay is intended as an aid in the diagnosis of influenza from Nasopharyngeal swab specimens and should not be used as a sole basis for treatment. Nasal washings and aspirates are unacceptable for Xpert Xpress SARS-CoV-2/FLU/RSV testing.  Fact Sheet for Patients: EntrepreneurPulse.com.au  Fact Sheet for Healthcare Providers: IncredibleEmployment.be  This test is not yet approved or cleared by the [place] FDA and has been authorized for detection and/or diagnosis of SARS-CoV-2 by FDA under an Emergency Use Authorization (EUA). This EUA will remain in effect (meaning this test can be used) for the duration of the COVID-19 declaration under Section 564(b)(1) of the Act, 21 U.S.C. section 360bbb-3(b)(1),  unless the authorization is terminated or revoked.  Performed at [facility], [place] [address], [place], [place] [address]      Labs: Basic Metabolic Panel: Recent Labs  Lab 12/11/20 1701 12/11/20 1703 12/12/20 0419  NA 135 132* 133*  K 4.2 4.3 4.0  CL 101 98 102  CO2  --  18* 21*  GLUCOSE 118* 119* 111*  BUN 20 17 22   CREATININE 0.70 0.89 0.65  CALCIUM  --  9.4 8.6*   Liver Function Tests: Recent Labs  Lab 12/11/20 1703  AST 34  ALT 27  ALKPHOS 74  BILITOT 0.5  PROT 6.6  ALBUMIN 3.8   No results for input(s): LIPASE, AMYLASE in the last 168 hours. No results for input(s): AMMONIA in the last 168 hours. CBC: Recent Labs  Lab 12/11/20 1701 12/11/20 1703 12/12/20 0419  WBC  --  12.9* 14.8*  NEUTROABS  --  7.0 11.7*  HGB 15.0 14.3 12.9  HCT 44.0 44.2 38.0  MCV  --  95.9 93.6  PLT  --  334 259   Cardiac Enzymes: No results for input(s): CKTOTAL, CKMB, CKMBINDEX, TROPONINI in the last 168 hours. BNP: BNP (last 3 results) No results for input(s): BNP in the last 8760 hours.  ProBNP (last 3 results) No results for input(s): PROBNP in the last 8760 hours.  CBG: Recent Labs  Lab 12/11/20 1652 12/11/20 2000 12/12/20 0348 12/12/20 0815  GLUCAP 124* 138* 122* 87       Signed:  50 minutes were spent in coordinating this patient's discharge today including discussion with outpatient providers, reviewing instructions with patient and performing a discharge examination Lorenza Chick, MD-PhD [facility] 12/12/2020, 7:47 PM

## 2020-12-12 NOTE — Care Management CC44 (Signed)
Condition Code 44 Documentation Completed  Patient Details  Name: Angela Carpenter MRN: [id number] Date of Birth: [birth year]   Condition Code 44 given:  Yes Patient signature on Condition Code 44 notice:  Yes Documentation of 2 MD's agreement:  Yes Code 44 added to claim:  Yes    Ella Bodo, RN 12/12/2020, 11:58 AM

## 2020-12-12 NOTE — Care Management Obs Status (Signed)
[place] NOTIFICATION   Patient Details  Name: Angela Carpenter MRN: [id number] Date of Birth: 03/26/1941   Medicare Observation Status Notification Given:  Yes    Ella Bodo, RN 12/12/2020, 11:58 AM

## 2020-12-12 NOTE — Progress Notes (Addendum)
OT Cancellation Note  Patient Details Name: Angela Carpenter MRN: [id number] DOB: July 18, 1941   Cancelled Treatment:    Reason Eval/Treat Not Completed: Active bedrest order. Will return as schedule allows.  Golf, OTR/L Acute Rehab Pager: [phone] Office: [phone] 12/12/2020, 7:20 AM

## 2020-12-12 NOTE — Procedures (Signed)
Patient Name: Angela Carpenter  MRN: [id number]  Epilepsy Attending: Lora Havens  Referring Physician/Provider: Dr Lesleigh Noe Date: 12/12/2020 Duration: 21.08 mins  Patient history: 80yo F with right arm shaking. EEG to evaluate for seizure  Level of alertness: Awake, drowsy, sleep, comatose, lethargic  AEDs during EEG study: LEV  Technical aspects: This EEG study was done with scalp electrodes positioned according to the 10-20 International system of electrode placement. Electrical activity was acquired at a sampling rate of 500Hz  and reviewed with a high frequency filter of 70Hz  and a low frequency filter of 1Hz . EEG data were recorded continuously and digitally stored.   Description: The posterior dominant rhythm consists of 9 Hz activity of moderate voltage (25-35 uV) seen predominantly in posterior head regions, symmetric and reactive to eye opening and eye closing. Hyperventilation and photic stimulation were not performed.     IMPRESSION: This study is within normal limits. No seizures or epileptiform discharges were seen throughout the recording.  Ylonda Storr Barbra Sarks

## 2020-12-12 NOTE — Discharge Instructions (Signed)
Managing Non-Epileptic Seizures, Adult A non-epileptic seizure is an event that can cause abnormal movements or a loss of consciousness. It is not caused by abnormal electrical and chemical activity in the brain (epileptic seizure). Non-epileptic seizures are caused by an underlying problem with body function (physiologic) or a mental health disorder (psychogenic). If you have been diagnosed with non-epileptic seizures, you can do things to manage your symptoms. You may have to try different things to see what works best for you. Your health care provider may also give you specific instructions. How does this condition affect me? If your seizures are physiologic, your health care provider will treat the cause. These seizures are not likely to return, and they do not need further treatment. If you have psychogenic non-epileptic seizures (PNES), it is very important to understand that your PNES is a real illness. Your seizures are not fake. They just have a different cause than other seizures. PNES can be treated. Work with your mental health provider to find a treatment that works for you. Talk with your health care provider about what activities are safe to do until your seizures are controlled. What actions can I take to manage the condition? Create a plan for how to deal with your seizures.  After a seizure, make notes about what was associated with the stress that led to the seizure. Then, create a plan to manage this stress.  Think of ways to change the stresses you cannot avoid. How to manage lifestyle changes Managing stress  Certain types of counseling can be very helpful for managing stress. A mental health provider can assess what other treatments may also help you, such as: ? Talk therapy (cognitive behavioral therapy, or CBT). Through CBT, you will learn to identify and manage the psychological distress that leads to seizures. ? Medicine to treat depression or anxiety. ? Biofeedback.  This uses signals from your body (physiologic responses) to help you learn to regulate anxiety.  Consider self-care strategies to lower stress levels, such as: ? Doing breathing exercises, yoga, or meditation. ? Listening to music. ? Doing recreational therapy or organized exercise. ? Giving yourself calming messages. ? Calling a friend to talk about your stress.   Relationships You may benefit from talking with a trusted friend or family member about your thoughts and feelings.   General instructions Learn as much as you can about your non-epileptic seizures.  Consider educating the people in your life about your condition. These should be trusted family members and others who spend time with you at work, school, or home.  Ask for the emotional support you would like.  Keep all follow-up visits. This is important. Follow these instructions at home: Medicines Medicines may be prescribed to treat depression or anxiety that causes non-epileptic seizures. Avoid using alcohol and other substances that may prevent your medicines from working properly (may interact). It is also important to:  Talk with your pharmacist or health care provider about all the medicines that you take, their possible side effects, and what medicines are safe to take together.  Make it your goal to take part in all treatment decisions (shared decision-making). Ask about possible side effects of medicines that your health care provider recommends, and tell him or her how you feel about having those side effects. It is best if shared decision-making with your health care provider is part of your total treatment plan.  Take over-the-counter and prescription medicines only as told by your health care provider. General instructions    Ask your health care provider if it is safe for you to drive.  Return to your normal activities as told by your health care provider. Ask your health care provider what activities are safe  for you. These include working and playing sports.  Follow all instructions from your health care provider. These may include ways to prevent seizures and what to do if you have a seizure.  Eat a balanced diet.  Make sure you get full nights of sleep and regular daily exercise.  Keep all follow-up visits. This is important. Where to find support You can get support for managing non-epileptic seizures from support groups, either online or in-person. Your health care provider may be able to recommend a support group in your area. Where to find more information  Epilepsy Foundation: epilepsy.com  American Epilepsy Society: aesnet.org Contact a health care provider if:  Your seizures change or happen more often.  You continue to have seizures after treatment.  You show signs of depression or anxiety.  You have trouble doing your normal daily routine or work schedule. Get help right away if:  You injure yourself during a seizure.  You have one seizure after another.  You have trouble recovering from a seizure.  You have chest pain or trouble breathing.  A seizure lasts longer than 5 minutes.  You think about harming yourself or others. These symptoms may represent a serious problem that is an emergency. Do not wait to see if the symptoms will go away. Get medical help right away. Call your local emergency services (911 in the [place] Do not drive yourself to the hospital. If you ever feel like you may hurt yourself or others, or have thoughts about taking your own life, get help right away. Go to your nearest emergency department or:  Call your local emergency services (911 in the [place]  Call a suicide crisis helpline, such as the [place] at 262-760-0762. This is open 24 hours a day in the [place]  Text the Crisis Text Line at 817-446-5765 (in the [place].). Summary  The two types of non-epileptic seizures are physiologic non-epileptic seizures and  psychogenic non-epileptic seizures.  Work with your mental health provider to find a treatment that works for you.  Learning to recognize and avoid stress is an important part of preventing non-epileptic seizures. This information is not intended to replace advice given to you by your health care provider. Make sure you discuss any questions you have with your health care provider. Document Revised: 04/04/2020 Document Reviewed: 04/04/2020 Elsevier Patient Education  2021 [employer]. Seizure, Adult A seizure is a sudden burst of abnormal electrical and chemical activity in the brain. Seizures usually last from 30 seconds to 2 minutes.  What are the causes? Common causes of this condition include:  Fever or infection.  Problems that affect the brain. These may include: ? A brain or head injury. ? Bleeding in the brain. ? A brain tumor.  Low levels of blood sugar or salt.  Kidney problems or liver problems.  Conditions that are passed from parent to child (are inherited).  Problems with a substance, such as: ? Having a reaction to a drug or a medicine. ? Stopping the use of a substance all of a sudden (withdrawal).  A stroke.  Disorders that affect how you develop. Sometimes, the cause may not be known.  What increases the risk?  Having someone in your family who has epilepsy. In this condition, seizures happen again  and again over time. They have no clear cause.  Having had a tonic-clonic seizure before. This type of seizure causes you to: ? Tighten the muscles of the whole body. ? Lose consciousness.  Having had a head injury or strokes before.  Having had a lack of oxygen at birth. What are the signs or symptoms? There are many types of seizures. The symptoms vary depending on the type of seizure you have. Symptoms during a seizure  Shaking that you cannot control (convulsions) with fast, jerky movements of muscles.  Stiffness of the body.  Breathing  problems.  Feeling mixed up (confused).  Staring or not responding to sound or touch.  Head nodding.  Eyes that blink, flutter, or move fast.  Drooling, grunting, or making clicking sounds with your mouth  Losing control of when you pee or poop. Symptoms before a seizure  Feeling afraid, nervous, or worried.  Feeling like you may vomit.  Feeling like: ? You are moving when you are not. ? Things around you are moving when they are not.  Feeling like you saw or heard something before (dj vu).  Odd tastes or smells.  Changes in how you see. You may see flashing lights or spots. Symptoms after a seizure  Feeling confused.  Feeling sleepy.  Headache.  Sore muscles. How is this treated? If your seizure stops on its own, you will not need treatment. If your seizure lasts longer than 5 minutes, you will normally need treatment. Treatment may include:  Medicines given through an IV tube.  Avoiding things, such as medicines, that are known to cause your seizures.  Medicines to prevent seizures.  A device to prevent or control seizures.  Surgery.  A diet low in carbohydrates and high in fat (ketogenic diet). Follow these instructions at home: Medicines  Take over-the-counter and prescription medicines only as told by your doctor.  Avoid foods or drinks that may keep your medicine from working, such as alcohol. Activity  Follow instructions about driving, swimming, or doing things that would be dangerous if you had another seizure. Wait until your doctor says it is safe for you to do these things.  If you live in the [place] ask your local department of motor vehicles when you can drive.  Get a lot of rest. Teaching others  Teach friends and family what to do when you have a seizure. They should: ? Help you get down to the ground. ? Protect your head and body. ? Loosen any clothing around your neck. ? Turn you on your side. ? Know whether or not you need  emergency care. ? Stay with you until you are better.  Also, tell them what not to do if you have a seizure. Tell them: ? They should not hold you down. ? They should not put anything in your mouth.   General instructions  Avoid anything that gives you seizures.  Keep a seizure diary. Write down: ? What you remember about each seizure. ? What you think caused each seizure.  Keep all follow-up visits. Contact a doctor if:  You have another seizure or seizures. Call the doctor each time you have a seizure.  The pattern of your seizures changes.  You keep having seizures with treatment.  You have symptoms of being sick or having an infection.  You are not able to take your medicine. Get help right away if:  You have any of these problems: ? A seizure that lasts longer than 5 minutes. ?  Many seizures in a row and you do not feel better between seizures. ? A seizure that makes it harder to breathe. ? A seizure and you can no longer speak or use part of your body.  You do not wake up right after a seizure.  You get hurt during a seizure.  You feel confused or have pain right after a seizure. These symptoms may be an emergency. Get help right away. Call your local emergency services (911 in the [place]  Do not wait to see if the symptoms will go away.  Do not drive yourself to the hospital. Summary  A seizure is a sudden burst of abnormal electrical and chemical activity in the brain. Seizures normally last from 30 seconds to 2 minutes.  Causes of seizures include illness, injury to the head, low levels of blood sugar or salt, and certain conditions.  Most seizures will stop on their own in less than 5 minutes. Seizures that last longer than 5 minutes are a medical emergency and need treatment right away.  Many medicines are used to treat seizures. Take over-the-counter and prescription medicines only as told by your doctor. This information is not intended to replace  advice given to you by your health care provider. Make sure you discuss any questions you have with your health care provider. Document Revised: 03/31/2020 Document Reviewed: 03/31/2020 Elsevier Patient Education  [place].

## 2020-12-12 NOTE — Evaluation (Signed)
Occupational Therapy Evaluation Patient Details Name: Angela Carpenter MRN: [id number] DOB: 01/13/1941 Today's Date: 12/12/2020    History of Present Illness pt is a 79 y/o female admitted after losing consciousness at her mailbox post suspect seizure with loss of control of her right arm.  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  PMH:  Melanoma, breast CA in remission, s/p chemo/radiation, afib, HTN, osteoporosis, stroke.   Clinical Impression   PTA, pt was living alone and was independent. Pt currently performing ADLs and functional mobility at Mod I-Independent level. Pt presenting WFL for balance, strength, and activity tolerance. Pt presenting with slight deficits in executive functioning compared to baseline as seen during simple money management question and trail making. However, pt performing asks safety and adjusting when given cues. Recommend dc to home once medically stable per physician. All acute OT needs met and will sign off.     Follow Up Recommendations  No OT follow up    Equipment Recommendations  None recommended by OT    Recommendations for Other Services PT consult     Precautions / Restrictions Precautions Precautions: None Restrictions Weight Bearing Restrictions: No      Mobility Bed Mobility Overal bed mobility: Independent                  Transfers Overall transfer level: Independent                    Balance Overall balance assessment: Independent                                         ADL either performed or assessed with clinical judgement   ADL Overall ADL's : Modified independent                                       General ADL Comments: Pt performing ADLs and functional mobility with slight increase in time     Vision Baseline Vision/History: Wears glasses Wears Glasses: At all times Patient Visual Report: No change from baseline Vision  Assessment?: No apparent visual deficits     Perception     Praxis      Pertinent Vitals/Pain Pain Assessment: No/denies pain     Hand Dominance Right   Extremity/Trunk Assessment Upper Extremity Assessment Upper Extremity Assessment: Overall WFL for tasks assessed   Lower Extremity Assessment Lower Extremity Assessment: Overall WFL for tasks assessed   Cervical / Trunk Assessment Cervical / Trunk Assessment: Normal   Communication Communication Communication: No difficulties   Cognition Arousal/Alertness: Awake/alert Behavior During Therapy: WFL for tasks assessed/performed Overall Cognitive Status: Within Functional Limits for tasks assessed                                  Pt able to name three animals that start with F. Requiring increased time and cues for simple money management question. Able to perform trail making task but poor executive functioning and using of external cues. Pt reporting she normally uses pen and paper for calculating simple math; provided pend and paper and pt requiring two attempts to answer simple math question (10.00 - 3.27).    General Comments  HR 101, RR 16,   SpO2 99%, BP 91/58    Exercises     Shoulder Instructions      Home Living Family/patient expects to be discharged to:: Private residence Living Arrangements: Alone Available Help at Discharge: Family;Available PRN/intermittently (Son comes and stays certain nights) Type of Home: House Home Access: Stairs to enter Entrance Stairs-Number of Steps: 4 Entrance Stairs-Rails: Left;Right;Can reach both Home Layout: Two level Alternate Level Stairs-Number of Steps: flight   Bathroom Shower/Tub: Tub/shower unit;Walk-in shower (tub upstairs)   Bathroom Toilet: Standard     Home Equipment: Grab bars - tub/shower;Shower seat          Prior Functioning/Environment Level of Independence: Independent        Comments: ADLs, IADLs, driving        OT Problem  List: Impaired balance (sitting and/or standing);Decreased cognition;Decreased knowledge of precautions      OT Treatment/Interventions:      OT Goals(Current goals can be found in the care plan section) Acute Rehab OT Goals Patient Stated Goal: Go home please OT Goal Formulation: All assessment and education complete, DC therapy  OT Frequency:     Barriers to D/C:            Co-evaluation              AM-PAC OT "6 Clicks" Daily Activity     Outcome Measure Help from another person eating meals?: None Help from another person taking care of personal grooming?: None Help from another person toileting, which includes using toliet, bedpan, or urinal?: None Help from another person bathing (including washing, rinsing, drying)?: None Help from another person to put on and taking off regular upper body clothing?: None Help from another person to put on and taking off regular lower body clothing?: None 6 Click Score: 24   End of Session Nurse Communication: Mobility status  Activity Tolerance: Patient tolerated treatment well Patient left: in bed;with call bell/phone within reach;with nursing/sitter in room;with family/visitor present  OT Visit Diagnosis: Unsteadiness on feet (R26.81);Other abnormalities of gait and mobility (R26.89);Muscle weakness (generalized) (M62.81)                Time: 1015-1035 OT Time Calculation (min): 20 min Charges:  OT General Charges $OT Visit: 1 Visit OT Evaluation $OT Eval Low Complexity: 1 Low  [name] MSOT, OTR/L Acute Rehab Pager: [phone] Office: [phone]  [name] 12/12/2020, 10:47 AM 

## 2020-12-12 NOTE — Progress Notes (Signed)
Pt d/c to home by car with family. Assessment stable. D/C instructions reviewed, prescription given. All questions answered.

## 2020-12-13 NOTE — Progress Notes (Signed)
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  She is status post perianal excision in August 2020.  Patient presented to the ER after a syncopal episode witnessed by her neighbors.  Patient states her arm was shaking uncontrollably while trying to get the mail and she passed out.   She is currently in remission from breast cancer.  She is under surveillance for melanoma, on a holiday from treatment.  She is followed by Dr. Alen Blew.  Treatment plan Pembrolizumab 200 mg every 3 weeks.  3T MRI Brain 12/20/2020:  MRI Brain 12/11/2020: Unchanged area of hemorrhage in the left frontal lobe with surrounding edema. No abnormal diffusion restriction. 19 mm contrast enhancing lesion at the site, which also has some elements of intrinsic hyperintense T1-weighted signal. There is multifocal hyperintense T2-weighted signal within the white matter. Generalized volume loss without a clear lobar predilection.  CT Head 12/11/2020: 2 cm lesion in the posterior left frontal lobe likely reflecting a hemorrhagic melanoma metastasis. Mild-to-moderate vasogenic edema without mass effect.  Past or anticipated interventions, if any, per neurosurgery:   Past or anticipated interventions, if any, per medical oncology:  Dr. Alen Blew 12/20/2020  Dose of Decadron, if applicable: None  Recent neurologic symptoms, if any:   Seizures: No  Headaches: No  Nausea: No  Dizziness/ataxia: None at this time.  Difficulty with hand coordination: No   Focal numbness/weakness: No  Visual deficits/changes: No  Confusion/Memory deficits: No    SAFETY ISSUES:  Prior radiation? 2006 Breast, thinks she had about 6 weeks, [place], [facility]  Pacemaker/ICD? No  Possible current pregnancy? Postmenopausal  Is the patient on methotrexate? No  Additional Complaints / other details:

## 2020-12-14 ENCOUNTER — Other Ambulatory Visit: Payer: Self-pay

## 2020-12-14 ENCOUNTER — Encounter: Payer: Self-pay | Admitting: Radiation Oncology

## 2020-12-14 ENCOUNTER — Ambulatory Visit
Admission: RE | Admit: 2020-12-14 | Discharge: 2020-12-14 | Disposition: A | Discharge status: Home / Self Care | Payer: Medicare Other | Source: Ambulatory Visit | Attending: Radiation Oncology | Admitting: Radiation Oncology

## 2020-12-14 VITALS — BP 127/76 | HR 92 | Temp 96.8°F | Resp 18 | Ht 65.0 in | Wt 148.1 lb

## 2020-12-14 DIAGNOSIS — J3489 Other specified disorders of nose and nasal sinuses: Secondary | ICD-10-CM | POA: Diagnosis not present

## 2020-12-14 DIAGNOSIS — Z9221 Personal history of antineoplastic chemotherapy: Secondary | ICD-10-CM | POA: Insufficient documentation

## 2020-12-14 DIAGNOSIS — M810 Age-related osteoporosis without current pathological fracture: Secondary | ICD-10-CM | POA: Insufficient documentation

## 2020-12-14 DIAGNOSIS — I6381 Other cerebral infarction due to occlusion or stenosis of small artery: Secondary | ICD-10-CM | POA: Insufficient documentation

## 2020-12-14 DIAGNOSIS — Z8673 Personal history of transient ischemic attack (TIA), and cerebral infarction without residual deficits: Secondary | ICD-10-CM | POA: Diagnosis not present

## 2020-12-14 DIAGNOSIS — C7931 Secondary malignant neoplasm of brain: Secondary | ICD-10-CM | POA: Diagnosis not present

## 2020-12-14 DIAGNOSIS — Z853 Personal history of malignant neoplasm of breast: Secondary | ICD-10-CM | POA: Diagnosis not present

## 2020-12-14 DIAGNOSIS — Z923 Personal history of irradiation: Secondary | ICD-10-CM | POA: Diagnosis not present

## 2020-12-14 DIAGNOSIS — Z79899 Other long term (current) drug therapy: Secondary | ICD-10-CM | POA: Insufficient documentation

## 2020-12-14 DIAGNOSIS — Z803 Family history of malignant neoplasm of breast: Secondary | ICD-10-CM | POA: Diagnosis not present

## 2020-12-14 DIAGNOSIS — I4891 Unspecified atrial fibrillation: Secondary | ICD-10-CM | POA: Diagnosis not present

## 2020-12-14 DIAGNOSIS — Z801 Family history of malignant neoplasm of trachea, bronchus and lung: Secondary | ICD-10-CM | POA: Insufficient documentation

## 2020-12-14 DIAGNOSIS — E7800 Pure hypercholesterolemia, unspecified: Secondary | ICD-10-CM | POA: Insufficient documentation

## 2020-12-14 DIAGNOSIS — C211 Malignant neoplasm of anal canal: Secondary | ICD-10-CM | POA: Insufficient documentation

## 2020-12-14 NOTE — Progress Notes (Signed)
Radiation Oncology         [phone]) [phone] ________________________________  Name: Angela Carpenter        MRN: [id number]  Date of Service: 12/14/2020 DOB: October 21, 1940  XB:[id number], [name], MD  Mickeal Skinner Acey Lav, MD     REFERRING PHYSICIAN: [name], MD   DIAGNOSIS: The primary encounter diagnosis was Malignant melanoma of anal canal ([place]). A diagnosis of Brain metastasis ([place]) was also pertinent to this visit.   HISTORY OF PRESENT ILLNESS: Angela Carpenter is a 80 y.o. female seen at the request of Dr. 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  The patient has been under the care of Dr. Alen Blew and has been receiving immunotherapy with pembrolizumab.  Though she has had low disease burden systemically and elected to take a break from immunotherapy she presented approximately a month after her last visit with Dr. Alen Blew to the emergency department on 12/11/2020 complaining of a headache that progressed to loss of control of her right arm and subsequent loss of consciousness.  EMS brought the patient in for a code stroke.  She had a prior stroke in 2015 but had no residual deficits.  She also has a remote history of breast cancer that was treated with right lumpectomy chemotherapy in the adjuvant setting and adjuvant radiotherapy.  She has been without recurrent disease for more than 15 years.  When she was evaluated in the emergency department, a CT without contrast of the brain was performed which revealed concern for a 2 cm hyperdense lesion in the left frontal lobe suspicious for a hemorrhagic mass.  Chronic lacunar infarcts of the left basal ganglia and periventricular white matter were also identified.  An MRI brain with and without contrast on 12/11/2020 showed hemorrhagic metastatic lesion consistent with her CT scan that was measured at 19 mm surrounding edema was also identified, she did also have an MRI with angiography  which showed no acute intracranial abnormality and chronic left MCA occlusion.  She does have an anatomic variance of fetal origin of the right PCA and shared origin of the left PCA.  Due to insurance she was only in the hospital under observational stay and was discharged home with Keppra.  It does not appear that she was sent home with any steroids.  She is seen today to discuss options of stereotactic radiosurgery, her case will be discussed in multidisciplinary brain oncology conference, and she is also scheduled next week on 12/20/2020 for 3T MRI scan.     PREVIOUS RADIATION THERAPY: Yes   2006: The patient received adjuvant radiotherapy to the right breast following lumpectomy in [place], [place]. Details are unavailable.   PAST MEDICAL HISTORY:  Past Medical History:  Diagnosis Date   A-fib [facility])    Atrial fibrillation ([place])    4 years ago started   Brain cancer ([place]) 12/2020   Breast cancer ([place]) 2006   Colon polyps    Glaucoma    Hypercholesteremia    Hypertension    patient denies    Osteoporosis    Personal history of chemotherapy 2006   Personal history of radiation therapy 2006   Stroke (cerebrum) [facility]) 2015       PAST SURGICAL HISTORY: Past Surgical History:  Procedure Laterality Date   AUGMENTATION MAMMAPLASTY Bilateral 2006   Patient had them removed in 2008   BREAST BIOPSY Left 2016   BREAST LUMPECTOMY Right 2006  COLONOSCOPY     had polyps   EYE SURGERY Right    cataract   MELANOMA EXCISION WITH SENTINEL LYMPH NODE BIOPSY N/A 06/02/2019   Procedure: Left lateral ANAL MELANOMA EXCISION WITH Bilateral Groin exploration and SENTINEL node mapping;  Surgeon: [name], MD;  Location: [place];  Service: General;  Laterality: N/A;   TONSILLECTOMY       FAMILY HISTORY:  Family History  Problem Relation Age of Onset   Lung cancer Mother    Hypotension Mother    Pancreatic cancer Father    Hypertension Sister    Kidney cancer Brother     Cirrhosis Maternal Grandfather    Breast cancer Paternal Grandmother    Cirrhosis Paternal Grandfather    Breast cancer Paternal Aunt      SOCIAL HISTORY:  reports that she is a non-smoker but has been exposed to tobacco smoke. She has never used smokeless tobacco. She reports that she does not drink alcohol and does not use drugs.  The patient is widowed and lives in [place]. Her son accompanies her today.   ALLERGIES: Patient has no known allergies.   MEDICATIONS:  Current Outpatient Medications  Medication Sig Dispense Refill   acyclovir (ZOVIRAX) 400 MG tablet Take 400 mg by mouth 2 (two) times daily.     alendronate (FOSAMAX) 70 MG tablet Take 70 mg by mouth once a week. Take with a full glass of water on an empty stomach.     atorvastatin (LIPITOR) 10 MG tablet Take 1 tablet (10 mg total) by mouth daily. (Patient taking differently: Take 10 mg by mouth every evening.) 90 tablet 3   Cholecalciferol (VITAMIN D-3) 125 MCG (5000 UT) TABS Take 5,000 Units by mouth daily.      levETIRAcetam (KEPPRA) 500 MG tablet Take 1 tablet (500 mg total) by mouth 2 (two) times daily. 60 tablet 0   metoprolol succinate (TOPROL-XL) 25 MG 24 hr tablet Take 1 tablet (25 mg total) by mouth 2 (two) times daily. 180 tablet 3   Multiple Vitamins-Minerals (PRESERVISION AREDS 2+MULTI VIT) CAPS Take 1 capsule by mouth 2 (two) times daily.     raloxifene (EVISTA) 60 MG tablet Take 60 mg by mouth in the morning.     timolol (TIMOPTIC) 0.5 % ophthalmic solution Place 1 drop into both eyes 2 (two) times daily.     apixaban (ELIQUIS) 5 MG TABS tablet  (Patient not taking: Reported on 12/14/2020)     Current Facility-Administered Medications  Medication Dose Route Frequency Provider Last Rate Last Admin   0.9 %  sodium chloride infusion  500 mL Intravenous Continuous Danis, Estill Cotta III, MD         REVIEW OF SYSTEMS: On review of systems, the patient reports that she is doing well overall. She  denies headaches, visual, speech, or movement concerns. She has not had any recurrence of seizure activity. She denies any chest pain, shortness of breath, cough, fevers, chills, night sweats, unintended weight changes. She denies any bowel or bladder disturbances, and denies abdominal pain, nausea or vomiting. She denies any new musculoskeletal or joint aches or pains. A complete review of systems is obtained and is otherwise negative.     PHYSICAL EXAM:  Wt Readings from Last 3 Encounters:  12/14/20 148 lb 2 oz (67.2 kg)  12/11/20 152 lb 5.4 oz (69.1 kg)  11/08/20 147 lb 8 oz (66.9 kg)   Temp Readings from Last 3 Encounters:  12/14/20 (!) 96.8 F (36 C) (Temporal)  12/12/20 99.5 F (37.5 C) (Oral)  11/08/20 (!) 97 F (36.1 C) (Tympanic)   BP Readings from Last 3 Encounters:  12/14/20 127/76  12/12/20 (!) 91/58  11/08/20 120/77   Pulse Readings from Last 3 Encounters:  12/14/20 92  12/12/20 87  11/08/20 72   Pain Assessment Pain Score: 0-No pain/10  In general this is a well appearing Caucasian female in no acute distress.  She's alert and oriented x4 and appropriate throughout the examination. Cardiopulmonary assessment is negative for acute distress and she exhibits normal effort.     ECOG = 0  0 - Asymptomatic (Fully active, able to carry on all predisease activities without restriction)  1 - Symptomatic but completely ambulatory (Restricted in physically strenuous activity but ambulatory and able to carry out work of a light or sedentary nature. For example, light housework, office work)  2 - Symptomatic, <50% in bed during the day (Ambulatory and capable of all self care but unable to carry out any work activities. Up and about more than 50% of waking hours)  3 - Symptomatic, >50% in bed, but not bedbound (Capable of only limited self-care, confined to bed or chair 50% or more of waking hours)  4 - Bedbound (Completely disabled. Cannot carry on any self-care.  Totally confined to bed or chair)  5 - Death   Angela Carpenter MM, Creech RH, Tormey DC, et al. [phone]). "Toxicity and response criteria of the [facility]". [place] Oncol. 5 (6): 649-55    LABORATORY DATA:  Lab Results  Component Value Date   WBC 14.8 (H) 12/12/2020   HGB 12.9 12/12/2020   HCT 38.0 12/12/2020   MCV 93.6 12/12/2020   PLT 259 12/12/2020   Lab Results  Component Value Date   NA 133 (L) 12/12/2020   K 4.0 12/12/2020   CL 102 12/12/2020   CO2 21 (L) 12/12/2020   Lab Results  Component Value Date   ALT 27 12/11/2020   AST 34 12/11/2020   ALKPHOS 74 12/11/2020   BILITOT 0.5 12/11/2020      RADIOGRAPHY: MR ANGIO HEAD WO CONTRAST  Result Date: 12/12/2020 CLINICAL DATA:  Stroke follow-up EXAM: MRI HEAD WITHOUT AND WITH CONTRAST MRA HEAD WITHOUT CONTRAST TECHNIQUE: Multiplanar, multiecho pulse sequences of the brain and surrounding structures were obtained without and with intravenous contrast. Angiographic images of the head were obtained using MRA technique without contrast. CONTRAST:  7.64mL GADAVIST GADOBUTROL 1 MMOL/ML IV SOLN COMPARISON:  Head CT 12/11/2020 FINDINGS: MRI HEAD FINDINGS Brain: Unchanged area of hemorrhage in the left frontal lobe with surrounding edema. No abnormal diffusion restriction. 19 mm contrast enhancing lesion at the site, which also has some elements of intrinsic hyperintense T1-weighted signal. There is multifocal hyperintense T2-weighted signal within the white matter. Generalized volume loss without a clear lobar predilection. Vascular: Major flow voids are preserved. Skull and upper cervical spine: Normal calvarium and skull base. Visualized upper cervical spine and soft tissues are normal. Sinuses/Orbits:No paranasal sinus fluid levels or advanced mucosal thickening. No mastoid or middle ear effusion. Normal orbits. MRA HEAD FINDINGS POSTERIOR CIRCULATION: --Vertebral arteries: Normal --Inferior cerebellar arteries: Normal.  --Basilar artery: Normal. --Superior cerebellar arteries: Normal. --Posterior cerebral arteries: Normal. ANTERIOR CIRCULATION: --Intracranial internal carotid arteries: Normal. --Anterior cerebral arteries (ACA): Normal. --Middle cerebral arteries (MCA): Chronic left MCA occlusion. Normal right. ANATOMIC VARIANTS: Fetal origin of the right PCA. Shared origin of the left PCA. IMPRESSION: 1. Hemorrhagic metastasis in the left frontal lobe with surrounding edema.  2. No acute intracranial abnormality. 3. Chronic left MCA occlusion. Electronically Signed   By: Ulyses Jarred M.D.   On: 12/12/2020 01:00   MR BRAIN W WO CONTRAST  Result Date: 12/12/2020 CLINICAL DATA:  Stroke follow-up EXAM: MRI HEAD WITHOUT AND WITH CONTRAST MRA HEAD WITHOUT CONTRAST TECHNIQUE: Multiplanar, multiecho pulse sequences of the brain and surrounding structures were obtained without and with intravenous contrast. Angiographic images of the head were obtained using MRA technique without contrast. CONTRAST:  7.56mL GADAVIST GADOBUTROL 1 MMOL/ML IV SOLN COMPARISON:  Head CT 12/11/2020 FINDINGS: MRI HEAD FINDINGS Brain: Unchanged area of hemorrhage in the left frontal lobe with surrounding edema. No abnormal diffusion restriction. 19 mm contrast enhancing lesion at the site, which also has some elements of intrinsic hyperintense T1-weighted signal. There is multifocal hyperintense T2-weighted signal within the white matter. Generalized volume loss without a clear lobar predilection. Vascular: Major flow voids are preserved. Skull and upper cervical spine: Normal calvarium and skull base. Visualized upper cervical spine and soft tissues are normal. Sinuses/Orbits:No paranasal sinus fluid levels or advanced mucosal thickening. No mastoid or middle ear effusion. Normal orbits. MRA HEAD FINDINGS POSTERIOR CIRCULATION: --Vertebral arteries: Normal --Inferior cerebellar arteries: Normal. --Basilar artery: Normal. --Superior cerebellar arteries: Normal.  --Posterior cerebral arteries: Normal. ANTERIOR CIRCULATION: --Intracranial internal carotid arteries: Normal. --Anterior cerebral arteries (ACA): Normal. --Middle cerebral arteries (MCA): Chronic left MCA occlusion. Normal right. ANATOMIC VARIANTS: Fetal origin of the right PCA. Shared origin of the left PCA. IMPRESSION: 1. Hemorrhagic metastasis in the left frontal lobe with surrounding edema. 2. No acute intracranial abnormality. 3. Chronic left MCA occlusion. Electronically Signed   By: Ulyses Jarred M.D.   On: 12/12/2020 01:00   EEG adult  Result Date: 12/12/2020 Lora Havens, MD     12/12/2020  8:42 AM Patient Name: CINDE EBERT MRN: [id number] Epilepsy Attending: Lora Havens Referring Physician/Provider: Dr Lesleigh Noe Date: 12/12/2020 Duration: 21.08 mins Patient history: 80yo F with right arm shaking. EEG to evaluate for seizure Level of alertness: Awake, drowsy, sleep, comatose, lethargic AEDs during EEG study: LEV Technical aspects: This EEG study was done with scalp electrodes positioned according to the 10-20 International system of electrode placement. Electrical activity was acquired at a sampling rate of 500Hz  and reviewed with a high frequency filter of 70Hz  and a low frequency filter of 1Hz . EEG data were recorded continuously and digitally stored. Description: The posterior dominant rhythm consists of 9 Hz activity of moderate voltage (25-35 uV) seen predominantly in posterior head regions, symmetric and reactive to eye opening and eye closing. Hyperventilation and photic stimulation were not performed.   IMPRESSION: This study is within normal limits. No seizures or epileptiform discharges were seen throughout the recording. Lora Havens   CT HEAD CODE STROKE WO CONTRAST  Result Date: 12/11/2020 CLINICAL DATA:  Code stroke. Left-sided weakness and slurred speech. History of melanoma. EXAM: CT HEAD WITHOUT CONTRAST TECHNIQUE: Contiguous axial images were obtained from the base  of the skull through the vertex without intravenous contrast. COMPARISON:  03/03/2016 FINDINGS: Brain: There is a 2.0 x 1.4 cm hyperdense lesion involving cortex/gray-white junction in the high posterior left frontal lobe most suspicious for a hemorrhagic mass with this clinical history. Low density in the surrounding white matter is consistent with mild-to-moderate vasogenic edema. No acute cortically based infarct is identified separate from this lesion. Chronic lacunar infarcts involving the left basal ganglia and periventricular white matter are unchanged. There is no midline shift or extra-axial fluid  collection. Vascular: Calcified atherosclerosis at the skull base. No hyperdense vessel. Skull: No fracture or suspicious osseous lesion. Sinuses/Orbits: Moderate bilateral ethmoid and mild bilateral maxillary sinus mucosal thickening. Clear mastoid air cells. Right cataract extraction. Other: None. ASPECTS [facility] Stroke Program Early CT Score) Not scored given the hemorrhagic mass. IMPRESSION: 1. 2 cm lesion in the posterior left frontal lobe likely reflecting a hemorrhagic melanoma metastasis. Mild-to-moderate vasogenic edema without mass effect. 2. Chronic lacunar infarcts in the left basal ganglia and periventricular white matter. These results were communicated to Dr. Curly Shores at 5:20 pm on 12/11/2020 by text page via the [facility] messaging system. Electronically Signed   By: Logan Bores M.D.   On: 12/11/2020 17:21       IMPRESSION/PLAN: 1. Stage IV mucosal melanoma arising in the anal canal now with brain metastasis. Dr. Lisbeth Renshaw discusses the patient's history and course today.  He reviews her recent imaging both CT and MRI based images and discusses the rationale for stereotacic radiosurgery  as a treatment for her disease in the brain.  We would recommend a 3T MRI scan for planning and additional characteristics for her treatment.  Dr. Kathyrn Sheriff in neurosurgery will also weigh in about her case. We do not  anticipate surgical resection but she is aware of some cases where preoperative SRS is utilized prior to surgical resection.  We discussed the risks, benefits, short, and long term effects of radiotherapy, as well as the curative intent, and the patient is interested in proceeding. Dr. Lisbeth Renshaw discusses the delivery and logistics of radiotherapy and anticipates a course of a single fraction of radiosurgery [facility]). Written consent is obtained and placed in the chart, a copy was provided to the patient.  Once we have her imaging, she will also simulate with MRI fusion on 12/21/2020 and received treatment on 12/28/2020.  She will also meet with Dr. Kathyrn Sheriff next week.  2. History of seizure and prior stroke. The patient is off eliquis and is curious about restarting this. Given these concerns as well as seizure at presentation, we will introduce her to Dr. Mickeal Skinner. He will see her tomorrow morning.  In a visit lasting 60 minutes, greater than 50% of the time was spent face to face discussing the patient's condition, in preparation for the discussion, and coordinating the patient's care.   The above documentation reflects my direct findings during this shared patient visit. Please see the separate note by Dr. Lisbeth Renshaw on this date for the remainder of the patient's plan of care.    Carola Rhine, [facility]   **Disclaimer: This note was dictated with voice recognition software. Similar sounding words can inadvertently be transcribed and this note may contain transcription errors which may not have been corrected upon publication of note.**

## 2020-12-15 ENCOUNTER — Other Ambulatory Visit: Payer: Self-pay

## 2020-12-15 ENCOUNTER — Encounter: Payer: Self-pay | Admitting: Internal Medicine

## 2020-12-15 ENCOUNTER — Inpatient Hospital Stay: Payer: Medicare Other | Attending: Oncology | Admitting: Internal Medicine

## 2020-12-15 VITALS — BP 118/70 | HR 74 | Temp 97.9°F | Resp 18 | Wt 149.0 lb

## 2020-12-15 DIAGNOSIS — Z801 Family history of malignant neoplasm of trachea, bronchus and lung: Secondary | ICD-10-CM | POA: Diagnosis not present

## 2020-12-15 DIAGNOSIS — C211 Malignant neoplasm of anal canal: Secondary | ICD-10-CM | POA: Insufficient documentation

## 2020-12-15 DIAGNOSIS — Z7952 Long term (current) use of systemic steroids: Secondary | ICD-10-CM | POA: Insufficient documentation

## 2020-12-15 DIAGNOSIS — Z5112 Encounter for antineoplastic immunotherapy: Secondary | ICD-10-CM | POA: Insufficient documentation

## 2020-12-15 DIAGNOSIS — Z853 Personal history of malignant neoplasm of breast: Secondary | ICD-10-CM | POA: Insufficient documentation

## 2020-12-15 DIAGNOSIS — Z803 Family history of malignant neoplasm of breast: Secondary | ICD-10-CM | POA: Insufficient documentation

## 2020-12-15 DIAGNOSIS — Z79899 Other long term (current) drug therapy: Secondary | ICD-10-CM | POA: Insufficient documentation

## 2020-12-15 DIAGNOSIS — C7931 Secondary malignant neoplasm of brain: Secondary | ICD-10-CM | POA: Diagnosis not present

## 2020-12-15 DIAGNOSIS — Z800 Family history of malignant neoplasm of digestive organs: Secondary | ICD-10-CM | POA: Insufficient documentation

## 2020-12-15 DIAGNOSIS — G40109 Localization-related (focal) (partial) symptomatic epilepsy and epileptic syndromes with simple partial seizures, not intractable, without status epilepticus: Secondary | ICD-10-CM

## 2020-12-15 NOTE — Progress Notes (Signed)
[facility] [address], [place] [address] [phone]   New Angela Evaluation  Date of Service: 12/15/20 Angela Name: Angela Carpenter Angela MRN: [id number] Angela DOB: 12-21-1940 Provider: [name], MD  Identifying Statement:  Angela Carpenter is a 80 y.o. female with Brain metastasis ([place]) [C79.31] who presents for initial consultation and evaluation regarding cancer associated neurologic deficits.    Referring Provider: Haywood Pao, [address] [place],  [place] [address]  Primary Cancer:  Oncologic History: Oncology History  Malignant melanoma of anal canal ([place])  07/21/2019 Initial Diagnosis   Malignant melanoma of anal canal ([place])   07/29/2019 -  Chemotherapy      Angela is on Antibody Plan: HEAD/NECK PEMBROLIZUMAB Q21D      History of Present Illness: The Angela's records from the referring physician were obtained and reviewed and the Angela interviewed to confirm this HPI.  Angela Carpenter presented to medical attention last week with new onset seizure, described as "right arm shaking, followed by loss of consciousness and whole body shaking".  CNS imaging demonstrated an enhancing and hemorrhagic left frontal mass, c/w melanoma met.  She was started on Keppra and discharged to home after return to baseline.  At this time, she is concerned about her Eliquis, which was stopped while in the hospital.  She has no other complaints today, no recurrence of seizures since discharge.  Planning radiosurgery for the metastasis.  Medications: Current Outpatient Medications on File Prior to Visit  Medication Sig Dispense Refill  . acyclovir (ZOVIRAX) 400 MG tablet Take 400 mg by mouth 2 (two) times daily.    Marland Kitchen alendronate (FOSAMAX) 70 MG tablet Take 70 mg by mouth once a week. Take with a full glass of water on an empty stomach.    Marland Kitchen atorvastatin (LIPITOR) 10 MG tablet Take 1 tablet (10 mg total) by mouth  daily. (Angela taking differently: Take 10 mg by mouth every evening.) 90 tablet 3  . Cholecalciferol (VITAMIN D-3) 125 MCG (5000 UT) TABS Take 5,000 Units by mouth daily.     Marland Kitchen levETIRAcetam (KEPPRA) 500 MG tablet Take 1 tablet (500 mg total) by mouth 2 (two) times daily. 60 tablet 0  . metoprolol succinate (TOPROL-XL) 25 MG 24 hr tablet Take 1 tablet (25 mg total) by mouth 2 (two) times daily. 180 tablet 3  . Multiple Vitamins-Minerals (PRESERVISION AREDS 2+MULTI VIT) CAPS Take 1 capsule by mouth 2 (two) times daily.    . raloxifene (EVISTA) 60 MG tablet Take 60 mg by mouth in the morning.    . timolol (TIMOPTIC) 0.5 % ophthalmic solution Place 1 drop into both eyes 2 (two) times daily.    Marland Kitchen apixaban (ELIQUIS) 5 MG TABS tablet  (Angela not taking: No sig reported)     Current Facility-Administered Medications on File Prior to Visit  Medication Dose Route Frequency Provider Last Rate Last Admin  . 0.9 %  sodium chloride infusion  500 mL Intravenous Continuous Danis, Kirke Corin, MD        Allergies: No Known Allergies Past Medical History:  Past Medical History:  Diagnosis Date  . A-fib ([place])   . Atrial fibrillation ([place])    4 years ago started  . Brain cancer ([place]) 12/2020  . Breast cancer ([place]) 2006  . Colon polyps   . Glaucoma   . Hypercholesteremia   . Hypertension    Angela denies   . Osteoporosis   .  Personal history of chemotherapy 2006  . Personal history of radiation therapy 2006  . Stroke (cerebrum) ([place]) 2015   Past Surgical History:  Past Surgical History:  Procedure Laterality Date  . AUGMENTATION MAMMAPLASTY Bilateral 2006   Angela had them removed in 2008  . BREAST BIOPSY Left 2016  . BREAST LUMPECTOMY Right 2006  . COLONOSCOPY     had polyps  . EYE SURGERY Right    cataract  . MELANOMA EXCISION WITH SENTINEL LYMPH NODE BIOPSY N/A 06/02/2019   Procedure: Left lateral ANAL MELANOMA EXCISION WITH Bilateral Groin exploration and SENTINEL node mapping;   Surgeon: [name], MD;  Location: [facility];  Service: General;  Laterality: N/A;  . TONSILLECTOMY     Social History:  Social History   Socioeconomic History  . Marital status: Widowed    Spouse name: Not on file  . Number of children: 1  . Years of education: Not on file  . Highest education level: Not on file  Occupational History  . Occupation: retired  Tobacco Use  . Smoking status: Passive Smoke Exposure - Never Smoker  . Smokeless tobacco: Never Used  . Tobacco comment: smoked in college but didn't inhale  Vaping Use  . Vaping Use: Never used  Substance and Sexual Activity  . Alcohol use: No    Alcohol/week: 0.0 standard drinks  . Drug use: No  . Sexual activity: Not on file  Other Topics Concern  . Not on file  Social History Narrative  . Not on file   Social Determinants of Health   Financial Resource Strain: Not on file  Food Insecurity: Not on file  Transportation Needs: Not on file  Physical Activity: Not on file  Stress: Not on file  Social Connections: Not on file  Intimate Partner Violence: Not on file   Family History:  Family History  Problem Relation Age of Onset  . Lung cancer Mother   . Hypotension Mother   . Pancreatic cancer Father   . Hypertension Sister   . Kidney cancer Brother   . Cirrhosis Maternal Grandfather   . Breast cancer Paternal Grandmother   . Cirrhosis Paternal Grandfather   . Breast cancer Paternal Aunt     Review of Systems: Constitutional: Doesn't report fevers, chills or abnormal weight loss Eyes: Doesn't report blurriness of vision Ears, nose, mouth, throat, and face: Doesn't report sore throat Respiratory: Doesn't report cough, dyspnea or wheezes Cardiovascular: Doesn't report palpitation, chest discomfort  Gastrointestinal:  Doesn't report nausea, constipation, diarrhea GU: Doesn't report incontinence Skin: Doesn't report skin rashes Neurological: Per HPI Musculoskeletal: Doesn't report joint  pain Behavioral/Psych: Doesn't report anxiety  Physical Exam: Vitals:   12/15/20 1100  BP: 118/70  Pulse: 74  Resp: 18  Temp: 97.9 F (36.6 C)  SpO2: 100%   KPS: 90. General: Alert, cooperative, pleasant, in no acute distress Head: Normal EENT: No conjunctival injection or scleral icterus.  Lungs: Resp effort normal Cardiac: Regular rate Abdomen: Non-distended abdomen Skin: No rashes cyanosis or petechiae. Extremities: No clubbing or edema  Neurologic Exam: Mental Status: Awake, alert, attentive to examiner. Oriented to self and environment. Language is fluent with intact comprehension.  Cranial Nerves: Visual acuity is grossly normal. Visual fields are full. Extra-ocular movements intact. No ptosis. Face is symmetric Motor: Tone and bulk are normal. Power is full in both arms and legs. Reflexes are symmetric, no pathologic reflexes present.  Sensory: Intact to light touch Gait: Normal.   Labs: I have reviewed the data  as listed    Component Value Date/Time   NA 133 (L) 12/12/2020 0419   NA 138 12/05/2016 0750   K 4.0 12/12/2020 0419   CL 102 12/12/2020 0419   CO2 21 (L) 12/12/2020 0419   GLUCOSE 111 (H) 12/12/2020 0419   BUN 22 12/12/2020 0419   BUN 15 12/05/2016 0750   CREATININE 0.65 12/12/2020 0419   CREATININE 0.69 11/08/2020 1130   CREATININE 0.70 09/05/2015 0828   CALCIUM 8.6 (L) 12/12/2020 0419   PROT 6.6 12/11/2020 1703   PROT 6.6 12/05/2016 0750   ALBUMIN 3.8 12/11/2020 1703   ALBUMIN 4.4 12/05/2016 0750   AST 34 12/11/2020 1703   AST 20 11/08/2020 1130   ALT 27 12/11/2020 1703   ALT 20 11/08/2020 1130   ALKPHOS 74 12/11/2020 1703   BILITOT 0.5 12/11/2020 1703   BILITOT 0.4 11/08/2020 1130   GFRNONAA >60 12/12/2020 0419   GFRNONAA >60 11/08/2020 1130   GFRAA >60 06/30/2020 0840   Lab Results  Component Value Date   WBC 14.8 (H) 12/12/2020   NEUTROABS 11.7 (H) 12/12/2020   HGB 12.9 12/12/2020   HCT 38.0 12/12/2020   MCV 93.6 12/12/2020    PLT 259 12/12/2020    Imaging:  MR ANGIO HEAD WO CONTRAST  Result Date: 12/12/2020 CLINICAL DATA:  Stroke follow-up EXAM: MRI HEAD WITHOUT AND WITH CONTRAST MRA HEAD WITHOUT CONTRAST TECHNIQUE: Multiplanar, multiecho pulse sequences of the brain and surrounding structures were obtained without and with intravenous contrast. Angiographic images of the head were obtained using MRA technique without contrast. CONTRAST:  7.34m GADAVIST GADOBUTROL 1 MMOL/ML IV SOLN COMPARISON:  Head CT 12/11/2020 FINDINGS: MRI HEAD FINDINGS Brain: Unchanged area of hemorrhage in the left frontal lobe with surrounding edema. No abnormal diffusion restriction. 19 mm contrast enhancing lesion at the site, which also has some elements of intrinsic hyperintense T1-weighted signal. There is multifocal hyperintense T2-weighted signal within the white matter. Generalized volume loss without a clear lobar predilection. Vascular: Major flow voids are preserved. Skull and upper cervical spine: Normal calvarium and skull base. Visualized upper cervical spine and soft tissues are normal. Sinuses/Orbits:No paranasal sinus fluid levels or advanced mucosal thickening. No mastoid or middle ear effusion. Normal orbits. MRA HEAD FINDINGS POSTERIOR CIRCULATION: --Vertebral arteries: Normal --Inferior cerebellar arteries: Normal. --Basilar artery: Normal. --Superior cerebellar arteries: Normal. --Posterior cerebral arteries: Normal. ANTERIOR CIRCULATION: --Intracranial internal carotid arteries: Normal. --Anterior cerebral arteries (ACA): Normal. --Middle cerebral arteries (MCA): Chronic left MCA occlusion. Normal right. ANATOMIC VARIANTS: Fetal origin of the right PCA. Shared origin of the left PCA. IMPRESSION: 1. Hemorrhagic metastasis in the left frontal lobe with surrounding edema. 2. No acute intracranial abnormality. 3. Chronic left MCA occlusion. Electronically Signed   By: KUlyses JarredM.D.   On: 12/12/2020 01:00   MR BRAIN W WO  CONTRAST  Result Date: 12/12/2020 CLINICAL DATA:  Stroke follow-up EXAM: MRI HEAD WITHOUT AND WITH CONTRAST MRA HEAD WITHOUT CONTRAST TECHNIQUE: Multiplanar, multiecho pulse sequences of the brain and surrounding structures were obtained without and with intravenous contrast. Angiographic images of the head were obtained using MRA technique without contrast. CONTRAST:  7.590mGADAVIST GADOBUTROL 1 MMOL/ML IV SOLN COMPARISON:  Head CT 12/11/2020 FINDINGS: MRI HEAD FINDINGS Brain: Unchanged area of hemorrhage in the left frontal lobe with surrounding edema. No abnormal diffusion restriction. 19 mm contrast enhancing lesion at the site, which also has some elements of intrinsic hyperintense T1-weighted signal. There is multifocal hyperintense T2-weighted signal within the white matter. Generalized volume  loss without a clear lobar predilection. Vascular: Major flow voids are preserved. Skull and upper cervical spine: Normal calvarium and skull base. Visualized upper cervical spine and soft tissues are normal. Sinuses/Orbits:No paranasal sinus fluid levels or advanced mucosal thickening. No mastoid or middle ear effusion. Normal orbits. MRA HEAD FINDINGS POSTERIOR CIRCULATION: --Vertebral arteries: Normal --Inferior cerebellar arteries: Normal. --Basilar artery: Normal. --Superior cerebellar arteries: Normal. --Posterior cerebral arteries: Normal. ANTERIOR CIRCULATION: --Intracranial internal carotid arteries: Normal. --Anterior cerebral arteries (ACA): Normal. --Middle cerebral arteries (MCA): Chronic left MCA occlusion. Normal right. ANATOMIC VARIANTS: Fetal origin of the right PCA. Shared origin of the left PCA. IMPRESSION: 1. Hemorrhagic metastasis in the left frontal lobe with surrounding edema. 2. No acute intracranial abnormality. 3. Chronic left MCA occlusion. Electronically Signed   By: Ulyses Jarred M.D.   On: 12/12/2020 01:00   EEG adult  Result Date: 12/12/2020 Lora Havens, MD     12/12/2020  8:42 AM  Angela Carpenter MRN: [id number] Epilepsy Attending: Lora Havens Referring Physician/Provider: Dr Lesleigh Noe Date: 12/12/2020 Duration: 21.08 mins Angela history: 80yo F with right arm shaking. EEG to evaluate for seizure Level of alertness: Awake, drowsy, sleep, comatose, lethargic AEDs during EEG study: LEV Technical aspects: This EEG study was done with scalp electrodes positioned according to the 10-20 International system of electrode placement. Electrical activity was acquired at a sampling rate of '500Hz'  and reviewed with a high frequency filter of '70Hz'  and a low frequency filter of '1Hz' . EEG data were recorded continuously and digitally stored. Description: The posterior dominant rhythm consists of 9 Hz activity of moderate voltage (25-35 uV) seen predominantly in posterior head regions, symmetric and reactive to eye opening and eye closing. Hyperventilation and photic stimulation were not performed.   IMPRESSION: This study is within normal limits. No seizures or epileptiform discharges were seen throughout the recording. Lora Havens   CT HEAD CODE STROKE WO CONTRAST  Result Date: 12/11/2020 CLINICAL DATA:  Code stroke. Left-sided weakness and slurred speech. History of melanoma. EXAM: CT HEAD WITHOUT CONTRAST TECHNIQUE: Contiguous axial images were obtained from the base of the skull through the vertex without intravenous contrast. COMPARISON:  03/03/2016 FINDINGS: Brain: There is a 2.0 x 1.4 cm hyperdense lesion involving cortex/gray-white junction in the high posterior left frontal lobe most suspicious for a hemorrhagic mass with this clinical history. Low density in the surrounding white matter is consistent with mild-to-moderate vasogenic edema. No acute cortically based infarct is identified separate from this lesion. Chronic lacunar infarcts involving the left basal ganglia and periventricular white matter are unchanged. There is no midline shift or extra-axial fluid  collection. Vascular: Calcified atherosclerosis at the skull base. No hyperdense vessel. Skull: No fracture or suspicious osseous lesion. Sinuses/Orbits: Moderate bilateral ethmoid and mild bilateral maxillary sinus mucosal thickening. Clear mastoid air cells. Right cataract extraction. Other: None. ASPECTS [facility] Stroke Program Early CT Score) Not scored given the hemorrhagic mass. IMPRESSION: 1. 2 cm lesion in the posterior left frontal lobe likely reflecting a hemorrhagic melanoma metastasis. Mild-to-moderate vasogenic edema without mass effect. 2. Chronic lacunar infarcts in the left basal ganglia and periventricular white matter. These results were communicated to Dr. Curly Shores at 5:20 pm on 12/11/2020 by text page via the [facility] messaging system. Electronically Signed   By: Logan Bores M.D.   On: 12/11/2020 17:21     Assessment/Plan Brain metastasis ([place]) [C79.31]  Angela Carpenter presents today with clinical syndrome consistent with focal epilepsy, secondary to left  frontal metastasis from melanoma.  We discussed the role of Eliquis in stroke prevention now that she has hemorrhagic lesion.  She was found to have paroxysmal atrial fibrillation in 2015 after LINQ placement... now that she is rate controlled recent ECGs have not demonstrated afib.  Her risk for hemorrhage is high at this time, given tendencies of melanoma and acute products seen on both CT and SWI MRI sequences.  We recommended continuing to hold Eliquis due to ongoing CNS hemorrhage risk, good control of atrial fibrillation, and transitioning to daily Aspirin 36m for secondary stroke prevention.   Will otherwise continue Keppra 5029mBID, counseled her and her son regarding seizure precautions.  We ask that Angela VANASTENeturn to clinic in 3 months following post-SRS brain MRI, or sooner as needed.  We spent twenty additional minutes teaching regarding the natural history, biology, and historical experience in the treatment of  neurologic complications of cancer.   We appreciate the opportunity to participate in the care of Angela Carpenter  All questions were answered. The Angela knows to call the clinic with any problems, questions or concerns. No barriers to learning were detected.  The total time spent in the encounter was 40 minutes and more than 50% was on counseling and review of test results   ZaVentura SellersMD Medical Director of Neuro-Oncology [facility]Rock[facility] We[facility]3/11/22 12:11 PM

## 2020-12-18 ENCOUNTER — Telehealth: Payer: Self-pay | Admitting: Licensed Clinical Social Worker

## 2020-12-18 ENCOUNTER — Inpatient Hospital Stay: Payer: Medicare Other

## 2020-12-18 DIAGNOSIS — C44719 Basal cell carcinoma of skin of left lower limb, including hip: Secondary | ICD-10-CM | POA: Diagnosis not present

## 2020-12-18 DIAGNOSIS — Z85820 Personal history of malignant melanoma of skin: Secondary | ICD-10-CM | POA: Diagnosis not present

## 2020-12-18 DIAGNOSIS — L814 Other melanin hyperpigmentation: Secondary | ICD-10-CM | POA: Diagnosis not present

## 2020-12-18 DIAGNOSIS — D692 Other nonthrombocytopenic purpura: Secondary | ICD-10-CM | POA: Diagnosis not present

## 2020-12-18 DIAGNOSIS — L565 Disseminated superficial actinic porokeratosis (DSAP): Secondary | ICD-10-CM | POA: Diagnosis not present

## 2020-12-18 DIAGNOSIS — L821 Other seborrheic keratosis: Secondary | ICD-10-CM | POA: Diagnosis not present

## 2020-12-18 DIAGNOSIS — D1801 Hemangioma of skin and subcutaneous tissue: Secondary | ICD-10-CM | POA: Diagnosis not present

## 2020-12-18 DIAGNOSIS — Z85828 Personal history of other malignant neoplasm of skin: Secondary | ICD-10-CM | POA: Diagnosis not present

## 2020-12-18 NOTE — Telephone Encounter (Signed)
CSW called patient back to follow-up on distress screen.  Patient reports doing okay at this time. Most difficult adjustment is not being able to drive and go somewhere whenever she desires. She does have family support (brother and sister-in-law in [place], son in [place] and he comes out on weekends) who help with appointments, going to the grocery store, etc. CSW shared information on [facility] transportation and [facility] [place] should patient want to access those in the future. Patient also agreed to be added to mailing list for support services calendars. No other needs at this time.   Christeen Douglas, LCSW

## 2020-12-18 NOTE — Telephone Encounter (Signed)
[place] Psychosocial Distress Screening Clinical Social Work  Clinical Social Work was referred by distress screening protocol.  The patient scored a 5 on the Psychosocial Distress Thermometer which indicates moderate distress. Clinical Social Worker contacted patient by phone to assess for distress and other psychosocial needs.  Patient was on her way to another appointment, so unable to speak at this time. Requested CSW call back this afternoon.  ONCBCN DISTRESS SCREENING 12/14/2020  Screening Type Initial Screening  Distress experienced in past week (1-10) 5  Emotional problem type Adjusting to illness;Isolation/feeling alone   Clinical Social Worker follow up needed: Yes.    If yes, follow up plan: CSW will attempt to call patient again this afternoon   [place], LCSW

## 2020-12-19 ENCOUNTER — Telehealth: Payer: Self-pay | Admitting: Internal Medicine

## 2020-12-19 NOTE — Telephone Encounter (Signed)
Scheduled per los. Called and left msg. Mailed printout  °

## 2020-12-20 ENCOUNTER — Ambulatory Visit
Admission: RE | Admit: 2020-12-20 | Discharge: 2020-12-20 | Disposition: A | Discharge status: Home / Self Care | Payer: Medicare Other | Source: Ambulatory Visit | Attending: Radiation Oncology | Admitting: Radiation Oncology

## 2020-12-20 ENCOUNTER — Inpatient Hospital Stay (HOSPITAL_BASED_OUTPATIENT_CLINIC_OR_DEPARTMENT_OTHER): Payer: Medicare Other | Admitting: Oncology

## 2020-12-20 ENCOUNTER — Other Ambulatory Visit: Payer: Self-pay

## 2020-12-20 ENCOUNTER — Inpatient Hospital Stay: Payer: Medicare Other

## 2020-12-20 VITALS — BP 146/76 | HR 86 | Temp 97.6°F | Resp 17 | Ht 65.0 in | Wt 149.2 lb

## 2020-12-20 DIAGNOSIS — C211 Malignant neoplasm of anal canal: Secondary | ICD-10-CM

## 2020-12-20 DIAGNOSIS — E039 Hypothyroidism, unspecified: Secondary | ICD-10-CM

## 2020-12-20 DIAGNOSIS — C7949 Secondary malignant neoplasm of other parts of nervous system: Secondary | ICD-10-CM

## 2020-12-20 DIAGNOSIS — Z7952 Long term (current) use of systemic steroids: Secondary | ICD-10-CM | POA: Diagnosis not present

## 2020-12-20 DIAGNOSIS — C7931 Secondary malignant neoplasm of brain: Secondary | ICD-10-CM

## 2020-12-20 DIAGNOSIS — C439 Malignant melanoma of skin, unspecified: Secondary | ICD-10-CM | POA: Diagnosis not present

## 2020-12-20 DIAGNOSIS — Z853 Personal history of malignant neoplasm of breast: Secondary | ICD-10-CM | POA: Diagnosis not present

## 2020-12-20 DIAGNOSIS — Z79899 Other long term (current) drug therapy: Secondary | ICD-10-CM | POA: Diagnosis not present

## 2020-12-20 DIAGNOSIS — Z5112 Encounter for antineoplastic immunotherapy: Secondary | ICD-10-CM | POA: Diagnosis not present

## 2020-12-20 DIAGNOSIS — G9389 Other specified disorders of brain: Secondary | ICD-10-CM | POA: Diagnosis not present

## 2020-12-20 LAB — CMP (CANCER CENTER ONLY)
ALT: 39 U/L (ref 0–44)
AST: 24 U/L (ref 15–41)
Albumin: 4 g/dL (ref 3.5–5.0)
Alkaline Phosphatase: 84 U/L (ref 38–126)
Anion gap: 6 (ref 5–15)
BUN: 13 mg/dL (ref 8–23)
CO2: 28 mmol/L (ref 22–32)
Calcium: 9.3 mg/dL (ref 8.9–10.3)
Chloride: 105 mmol/L (ref 98–111)
Creatinine: 0.71 mg/dL (ref 0.44–1.00)
GFR, Estimated: >60 mL/min (ref >60)
Glucose, Bld: 98 mg/dL (ref 70–99)
Potassium: 4.6 mmol/L (ref 3.5–5.1)
Sodium: 139 mmol/L (ref 135–145)
Total Bilirubin: 0.4 mg/dL (ref 0.3–1.2)
Total Protein: 6.6 g/dL (ref 6.5–8.1)

## 2020-12-20 LAB — CBC WITH DIFFERENTIAL (CANCER CENTER ONLY)
Abs Immature Granulocytes: 0.04 10*3/uL (ref 0.00–0.07)
Basophils Absolute: 0.1 10*3/uL (ref 0.0–0.1)
Basophils Relative: 1 %
Eosinophils Absolute: 0.5 10*3/uL (ref 0.0–0.5)
Eosinophils Relative: 5 %
HCT: 41.9 % (ref 36.0–46.0)
Hemoglobin: 13.6 g/dL (ref 12.0–15.0)
Immature Granulocytes: 0 %
Lymphocytes Relative: 18 %
Lymphs Abs: 1.7 10*3/uL (ref 0.7–4.0)
MCH: 31.2 pg (ref 26.0–34.0)
MCHC: 32.5 g/dL (ref 30.0–36.0)
MCV: 96.1 fL (ref 80.0–100.0)
Monocytes Absolute: 0.8 10*3/uL (ref 0.1–1.0)
Monocytes Relative: 9 %
Neutro Abs: 6.3 10*3/uL (ref 1.7–7.7)
Neutrophils Relative %: 67 %
Platelet Count: 254 10*3/uL (ref 150–400)
RBC: 4.36 MIL/uL (ref 3.87–5.11)
RDW: 13.7 % (ref 11.5–15.5)
WBC Count: 9.4 10*3/uL (ref 4.0–10.5)
nRBC: 0 % (ref 0.0–0.2)

## 2020-12-20 IMAGING — MR MR HEAD WO/W CM
12 series · 48 of 48 positions shown · IV contrast (multihance)
Comparison: [DATE] noncontrast study

CLINICAL DATA: Melanoma, breast cancer with brain metastasis;
treatment planning

EXAM:
MRI HEAD WITHOUT AND WITH CONTRAST
TECHNIQUE: Multiplanar, multiecho pulse sequences of the brain and surrounding
structures were obtained without and with intravenous contrast.
CONTRAST:  13mL MULTIHANCE GADOBENATE DIMEGLUMINE 529 MG/ML IV SOLN

[Series 2: FLAIR · sagittal · 3.0mm · 0.75mm/px · 3 of 39 slices shown (1 of 2)]
[im 1/39]
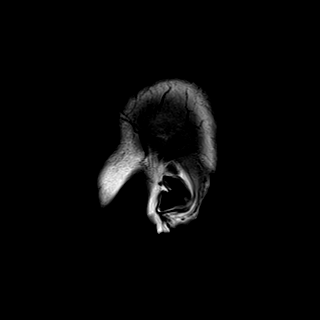
[im 20/39]
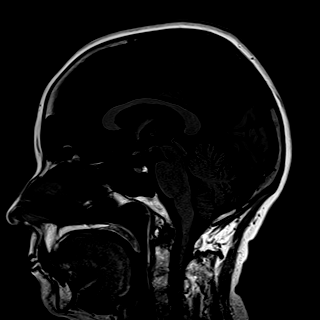
[im 39/39]
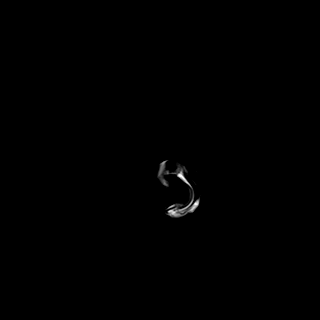

[Series 3: DWI · axial · 3.0mm · 1.50mm/px · z∈[-39,+124]mm · 4 of 86 slices shown (1 of 2)]
[im 1/86]
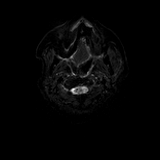
[im 29/86]
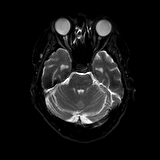
[im 57/86]
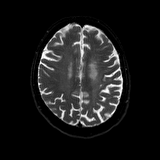
[im 86/86]
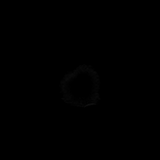

[Series 4: DWI · axial · 3.0mm · 1.50mm/px · z∈[-39,+124]mm · 2 of 43 slices shown (2 of 2)]
[im 1/43]
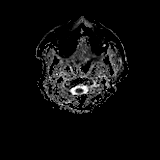
[im 43/43]
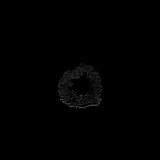

[Series 5: T2 · axial · 5.0mm · 0.57mm/px · 1 of 27 slices shown (1 of 2)]
[im 1/27]
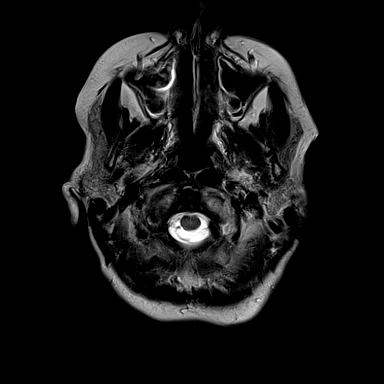

[Series 7: swi_images · axial · 1.5mm · 0.90mm/px · z∈[-33,+122]mm · 5 of 104 slices shown]
[im 1/104]
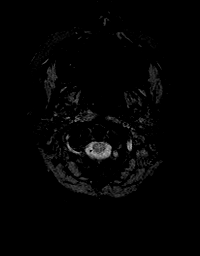
[im 26/104]
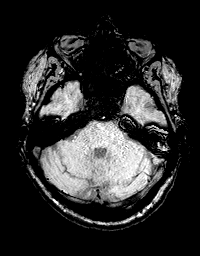
[im 52/104]
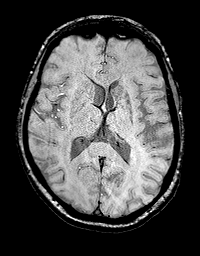
[im 78/104]
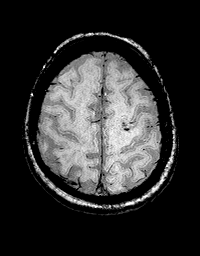
[im 104/104]
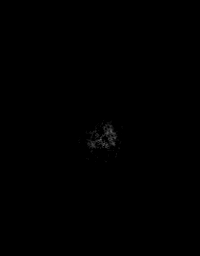

[Series 8: FLAIR · axial · 3.0mm · 0.86mm/px · z∈[-47,+115]mm · 3 of 55 slices shown (2 of 2)]
[im 1/55]
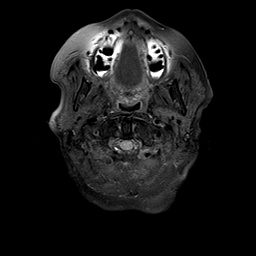
[im 28/55]
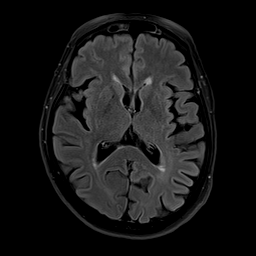
[im 55/55]
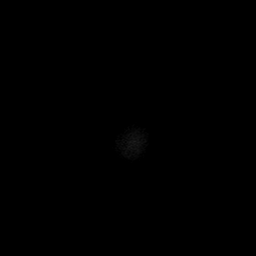

[Series 9: T2 · axial · non-contrast · 1.0mm · 0.86mm/px · z∈[-43,+113]mm · 8 of 160 slices shown (2 of 2)]
[im 1/160]
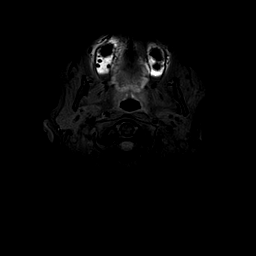
[im 23/160]
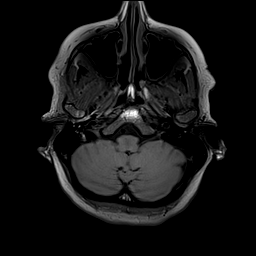
[im 46/160]
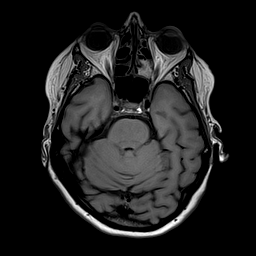
[im 69/160]
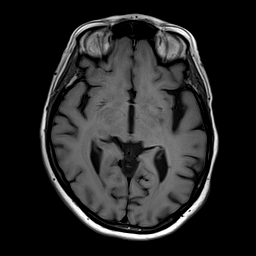
[im 91/160]
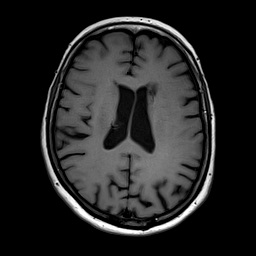
[im 114/160]
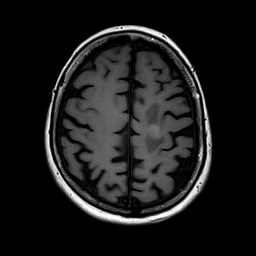
[im 137/160]
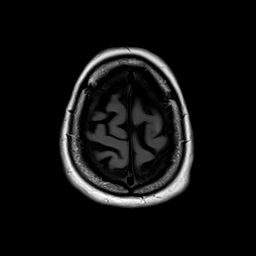
[im 160/160]
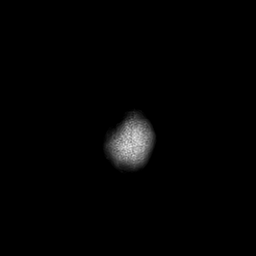

[Series 10: T2 post-contrast · coronal · 3.0mm · 0.57mm/px · 2 of 47 slices shown (1 of 2)]
[im 1/47]
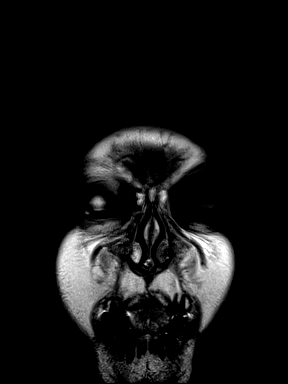
[im 47/47]
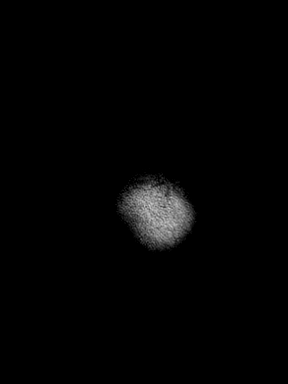

[Series 11: T2 post-contrast · axial · 1.0mm · 0.86mm/px · z∈[-43,+113]mm · 8 of 160 slices shown (2 of 2)]
[im 1/160]
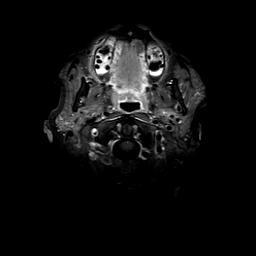
[im 23/160]
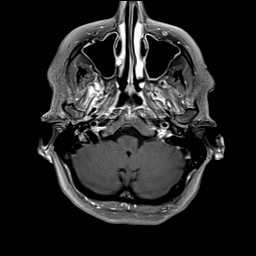
[im 46/160]
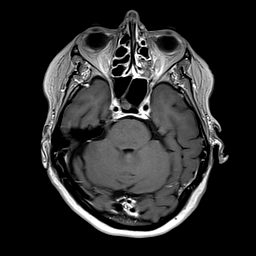
[im 69/160]
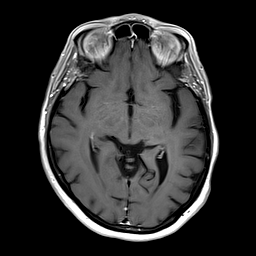
[im 91/160]
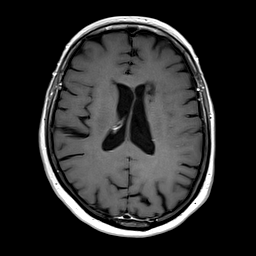
[im 114/160]
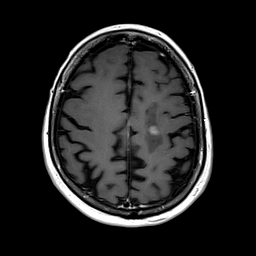
[im 137/160]
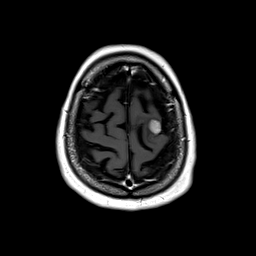
[im 160/160]
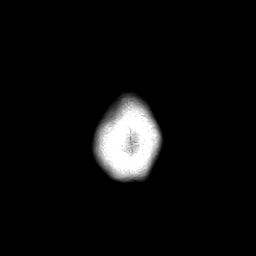

[Series 12: T1 post-contrast · axial · 1.0mm · 0.75mm/px · z∈[-44,+115]mm · 8 of 160 slices shown (1 of 2)]
[im 1/160]
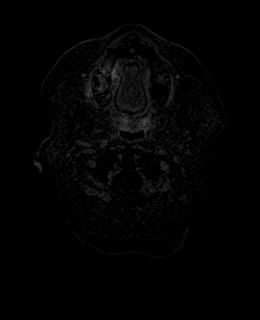
[im 23/160]
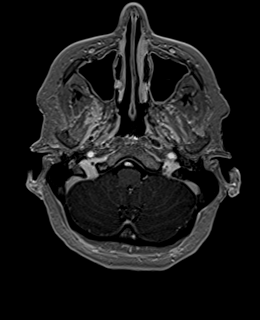
[im 46/160]
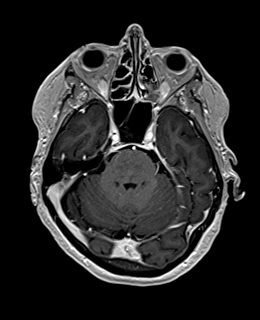
[im 69/160]
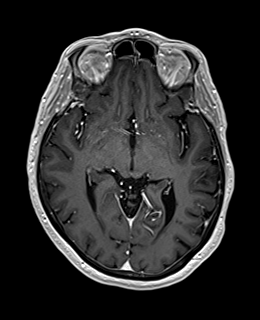
[im 91/160]
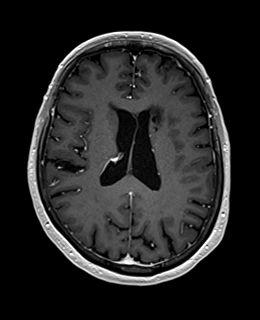
[im 114/160]
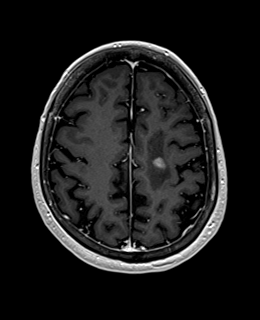
[im 137/160]
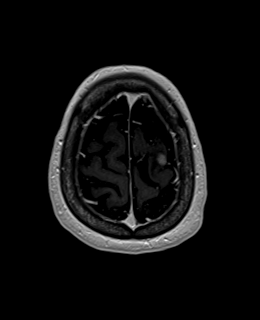
[im 160/160]
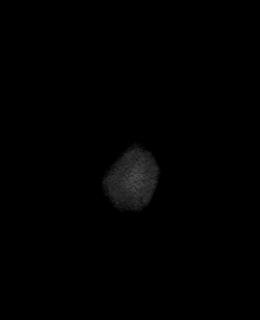

[Series 13: T1 post-contrast · coronal · 3.0mm · 0.57mm/px · 2 of 47 slices shown (2 of 2)]
[im 1/47]
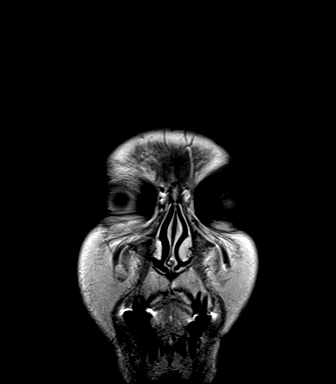
[im 47/47]
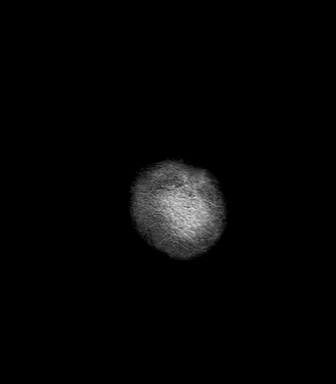

[Series 14: FLAIR post-contrast · sagittal · 3.0mm · 0.75mm/px · 2 of 41 slices shown]
[im 1/41]
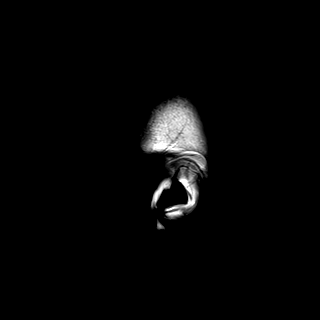
[im 41/41]
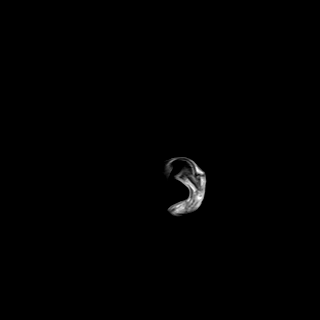

[48 of 48 positions shown; findings below may reference images not displayed]

FINDINGS: Brain: There is a is 2 x 1.4 x 2.4 cm enhancing lesion of the left
frontal lobe involving superior frontal and precentral gyri.
Associated T1 shortening and susceptibility. Surrounding edema is
present without significant mass effect. No additional mass or
abnormal enhancement.

Patchy foci of T2 hyperintensity in the supratentorial white matter
nonspecific but may reflect mild chronic microvascular ischemic
changes. There are chronic small vessel infarcts of the left corona
radiata and basal ganglia. There is no hydrocephalus or extra-axial
fluid collection.

Vascular: Major vessel flow voids at the skull base are preserved.

Skull and upper cervical spine: Normal marrow signal is preserved.

Sinuses/Orbits: Mild to moderate mucosal thickening. No significant
orbital abnormality.

Other: Sella is unremarkable.  Mastoid air cells are clear.
IMPRESSION: Single left frontal metastasis. Edema is present without significant
mass effect.

## 2020-12-20 MED ORDER — GADOBENATE DIMEGLUMINE 529 MG/ML IV SOLN
13.0000 mL | Freq: Once | INTRAVENOUS | Status: AC | PRN
  Administered 2020-12-20: 13 mL via INTRAVENOUS

## 2020-12-20 NOTE — Progress Notes (Signed)
Has armband been applied?  Yes  Does patient have an allergy to IV contrast dye?: No   Has patient ever received premedication for IV contrast dye?: n/a  Does patient take metformin?: No  If patient does take metformin when was the last dose: n/a  Date of lab work: 12/12/2020 BUN: 22 CR: 0.65 eGFR: >60  IV site: Left Forearm  Has IV site been added to flowsheet?  Yes  BP 140/68 (BP Location: Left Arm, Patient Position: Sitting)   Pulse 86   Temp (!) 97 F (36.1 C) (Temporal)   Resp 18   Ht '5\' 5"'  (1.651 m)   Wt 149 lb 2 oz (67.6 kg)   SpO2 100%   BMI 24.82 kg/m    Edmund Rick M. Leonie Green, BSN

## 2020-12-20 NOTE — Progress Notes (Signed)
Hematology and Oncology Follow Up Visit  Angela Carpenter [id number] [birth year] 80 y.o. 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  She presented with pelvic nodules on developed CNS disease in March 2022.   Prior Therapy:   She is status post perianal excision in August 2020.  Under evaluation for radiation SRS to the brain to be completed in the next week.  Pembrolizumab 200 mg every 3 weeks started on July 29, 2019.  Last treatment given on November 08, 2020.  Current therapy: She is currently on treatment break.  Interim History: Angela Carpenter presents today for a return follow-up.  Since the last visit, she was hospitalized between March 7 and December 12, 2020 after presenting with seizure activity and loss of consciousness.  Her evaluation at that time revealed hemorrhagic metastatic lesion in the left frontal lobe with surrounding edema.  Eliquis was discontinued and was started on Keppra after evaluation by Dr. Mickeal Skinner.     Clinically, she reports feeling well without any other complaints.  She denies any headaches, blurry vision or weakness.  She denies any other seizure activity.  She has resumed all activities of daily living.      Medications: Unchanged on review. Current Outpatient Medications  Medication Sig Dispense Refill  . acyclovir (ZOVIRAX) 400 MG tablet Take 400 mg by mouth 2 (two) times daily.    Marland Kitchen alendronate (FOSAMAX) 70 MG tablet Take 70 mg by mouth once a week. Take with a full glass of water on an empty stomach.    Marland Kitchen apixaban (ELIQUIS) 5 MG TABS tablet  (Patient not taking: No sig reported)    . atorvastatin (LIPITOR) 10 MG tablet Take 1 tablet (10 mg total) by mouth daily. (Patient taking differently: Take 10 mg by mouth every evening.) 90 tablet 3  . Cholecalciferol (VITAMIN D-3) 125 MCG (5000 UT) TABS Take 5,000 Units by mouth daily.     Marland Kitchen  levETIRAcetam (KEPPRA) 500 MG tablet Take 1 tablet (500 mg total) by mouth 2 (two) times daily. 60 tablet 0  . metoprolol succinate (TOPROL-XL) 25 MG 24 hr tablet Take 1 tablet (25 mg total) by mouth 2 (two) times daily. 180 tablet 3  . Multiple Vitamins-Minerals (PRESERVISION AREDS 2+MULTI VIT) CAPS Take 1 capsule by mouth 2 (two) times daily.    . raloxifene (EVISTA) 60 MG tablet Take 60 mg by mouth in the morning.    . timolol (TIMOPTIC) 0.5 % ophthalmic solution Place 1 drop into both eyes 2 (two) times daily.     Current Facility-Administered Medications  Medication Dose Route Frequency Provider Last Rate Last Admin  . 0.9 %  sodium chloride infusion  500 mL Intravenous Continuous Danis, Estill Cotta III, MD         Allergies: No Known Allergies     Physical Exam:  Blood pressure (!) 146/76, pulse 86, temperature 97.6 F (36.4 C), temperature source Tympanic, resp. rate 17, height 5\' 5"  (1.651 m), weight 149 lb 3.2 oz (67.7 kg), SpO2 98 %.     ECOG: 1   General appearance: Alert, awake without any distress. Head: Atraumatic without abnormalities Oropharynx: Without any thrush or ulcers. Eyes: No scleral icterus. Lymph nodes: No lymphadenopathy noted in the cervical, supraclavicular, or axillary nodes Heart:regular rate and rhythm, without any murmurs or gallops.   Lung: Clear to auscultation without any rhonchi, wheezes or dullness to percussion. Abdomin: Soft,  nontender without any shifting dullness or ascites. Musculoskeletal: No clubbing or cyanosis. Neurological: No motor or sensory deficits. Skin: No rashes or lesions.                           Lab Results: Lab Results  Component Value Date   WBC 14.8 (H) 12/12/2020   HGB 12.9 12/12/2020   HCT 38.0 12/12/2020   MCV 93.6 12/12/2020   PLT 259 12/12/2020     Chemistry      Component Value Date/Time   NA 133 (L) 12/12/2020 0419   NA 138 12/05/2016 0750   K 4.0 12/12/2020 0419   CL 102  12/12/2020 0419   CO2 21 (L) 12/12/2020 0419   BUN 22 12/12/2020 0419   BUN 15 12/05/2016 0750   CREATININE 0.65 12/12/2020 0419   CREATININE 0.69 11/08/2020 1130   CREATININE 0.70 09/05/2015 0828      Component Value Date/Time   CALCIUM 8.6 (L) 12/12/2020 0419   ALKPHOS 74 12/11/2020 1703   AST 34 12/11/2020 1703   AST 20 11/08/2020 1130   ALT 27 12/11/2020 1703   ALT 20 11/08/2020 1130   BILITOT 0.5 12/11/2020 1703   BILITOT 0.4 11/08/2020 1130         Impression and Plan:  80 year old with:  1.   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    Her disease status was updated at this time and treatment options were reviewed.  He continues to receive Pembrolizumab without any major complications of the left February 2022 and has been on treatment breaks for short period of time.  Risks and benefits of resuming this treatment versus switching to a different salvage therapy were reviewed.  Alternative options including combination immunotherapy with ipilimumab and nivolumab.   After discussion today, I recommended resuming Pembrolizumab single agent for the time being for combination unless he has more systemic disease.  2.  Immune mediated complications: She has not experienced any complications at this time.  Complication clinic pneumonitis, colitis and thyroid disease were reviewed.   3. Dermatology surveillance: She continues to be up-to-date on her dermatology screening.  4.  Antiemetics: Compazine is available to her without any issues.  5.  CNS metastasis: She has isolated lesion and will undergo SRS treatment in the near future.  I agree with holding Eliquis at this time.  6.  Breast cancer: Status post treatment previously and remains in remission.  7.  Follow-up: Will be in the near future to start Pembrolizumab.  30 minutes were spent on this visit.  The time was dedicated to reviewing  imaging studies, disease status update and treatment options for the future.   Angela Button, MD 3/16/20222:56 PM

## 2020-12-21 ENCOUNTER — Ambulatory Visit
Admission: RE | Admit: 2020-12-21 | Discharge: 2020-12-21 | Disposition: A | Discharge status: Home / Self Care | Payer: Medicare Other | Source: Ambulatory Visit | Attending: Radiation Oncology | Admitting: Radiation Oncology

## 2020-12-21 VITALS — BP 140/68 | HR 86 | Temp 97.0°F | Resp 18 | Ht 65.0 in | Wt 149.1 lb

## 2020-12-21 DIAGNOSIS — C211 Malignant neoplasm of anal canal: Secondary | ICD-10-CM | POA: Diagnosis not present

## 2020-12-21 DIAGNOSIS — C7931 Secondary malignant neoplasm of brain: Secondary | ICD-10-CM | POA: Insufficient documentation

## 2020-12-21 LAB — TSH: TSH: 3.081 u[IU]/mL (ref 0.308–3.960)

## 2020-12-21 MED ORDER — SODIUM CHLORIDE 0.9% FLUSH
10.0000 mL | Freq: Once | INTRAVENOUS | Status: AC
  Administered 2020-12-21: 10 mL via INTRAVENOUS

## 2020-12-22 ENCOUNTER — Telehealth: Payer: Self-pay | Admitting: Oncology

## 2020-12-22 DIAGNOSIS — C7931 Secondary malignant neoplasm of brain: Secondary | ICD-10-CM | POA: Diagnosis not present

## 2020-12-22 DIAGNOSIS — C211 Malignant neoplasm of anal canal: Secondary | ICD-10-CM | POA: Diagnosis not present

## 2020-12-22 DIAGNOSIS — I611 Nontraumatic intracerebral hemorrhage in hemisphere, cortical: Secondary | ICD-10-CM | POA: Diagnosis not present

## 2020-12-22 DIAGNOSIS — R569 Unspecified convulsions: Secondary | ICD-10-CM | POA: Diagnosis not present

## 2020-12-22 DIAGNOSIS — I480 Paroxysmal atrial fibrillation: Secondary | ICD-10-CM | POA: Diagnosis not present

## 2020-12-22 DIAGNOSIS — C4362 Malignant melanoma of left upper limb, including shoulder: Secondary | ICD-10-CM | POA: Diagnosis not present

## 2020-12-22 NOTE — Telephone Encounter (Signed)
Left message with follow-up appointments per 3/16 los. Gave option to call back to reschedule if needed. 

## 2020-12-27 DIAGNOSIS — C7931 Secondary malignant neoplasm of brain: Secondary | ICD-10-CM | POA: Insufficient documentation

## 2020-12-28 ENCOUNTER — Ambulatory Visit
Admission: RE | Admit: 2020-12-28 | Discharge: 2020-12-28 | Disposition: A | Discharge status: Home / Self Care | Payer: Medicare Other | Source: Ambulatory Visit | Attending: Radiation Oncology | Admitting: Radiation Oncology

## 2020-12-28 ENCOUNTER — Other Ambulatory Visit: Payer: Self-pay

## 2020-12-28 ENCOUNTER — Encounter: Payer: Self-pay | Admitting: Radiation Oncology

## 2020-12-28 DIAGNOSIS — C211 Malignant neoplasm of anal canal: Secondary | ICD-10-CM | POA: Diagnosis not present

## 2020-12-28 DIAGNOSIS — C7931 Secondary malignant neoplasm of brain: Secondary | ICD-10-CM | POA: Diagnosis not present

## 2020-12-28 NOTE — Progress Notes (Signed)
Mrs. Lueck rested with [place] for 30 minutes following her [place] treatment.  Patient denies headache, dizziness, nausea, diplopia or ringing in the ears. Denies fatigue. Patient without complaints. Understands to avoid strenuous activity for the next 24 hours and call [phone] with needs.   BP (!) 144/69   Pulse 75   Temp (!) 96.7 F (35.9 C)   Resp 18   SpO2 100%    Mendi Constable M. Leonie Green, BSN

## 2020-12-31 ENCOUNTER — Emergency Department (HOSPITAL_COMMUNITY): Payer: Medicare Other

## 2020-12-31 ENCOUNTER — Emergency Department (HOSPITAL_COMMUNITY)
Admission: EM | Admit: 2020-12-31 | Discharge: 2020-12-31 | Disposition: A | Discharge status: Home / Self Care | Payer: Medicare Other | Attending: Emergency Medicine | Admitting: Emergency Medicine

## 2020-12-31 ENCOUNTER — Other Ambulatory Visit: Payer: Self-pay

## 2020-12-31 ENCOUNTER — Encounter (HOSPITAL_COMMUNITY): Payer: Self-pay | Admitting: Emergency Medicine

## 2020-12-31 DIAGNOSIS — Z7722 Contact with and (suspected) exposure to environmental tobacco smoke (acute) (chronic): Secondary | ICD-10-CM | POA: Diagnosis not present

## 2020-12-31 DIAGNOSIS — Z853 Personal history of malignant neoplasm of breast: Secondary | ICD-10-CM | POA: Insufficient documentation

## 2020-12-31 DIAGNOSIS — Z79899 Other long term (current) drug therapy: Secondary | ICD-10-CM | POA: Diagnosis not present

## 2020-12-31 DIAGNOSIS — Z85841 Personal history of malignant neoplasm of brain: Secondary | ICD-10-CM | POA: Insufficient documentation

## 2020-12-31 DIAGNOSIS — Z85820 Personal history of malignant melanoma of skin: Secondary | ICD-10-CM | POA: Diagnosis not present

## 2020-12-31 DIAGNOSIS — G936 Cerebral edema: Secondary | ICD-10-CM | POA: Insufficient documentation

## 2020-12-31 DIAGNOSIS — Z7982 Long term (current) use of aspirin: Secondary | ICD-10-CM | POA: Insufficient documentation

## 2020-12-31 DIAGNOSIS — I619 Nontraumatic intracerebral hemorrhage, unspecified: Secondary | ICD-10-CM | POA: Diagnosis not present

## 2020-12-31 DIAGNOSIS — I10 Essential (primary) hypertension: Secondary | ICD-10-CM | POA: Diagnosis not present

## 2020-12-31 DIAGNOSIS — R531 Weakness: Secondary | ICD-10-CM | POA: Diagnosis not present

## 2020-12-31 LAB — CBC
HCT: 45.7 % (ref 36.0–46.0)
Hemoglobin: 15.3 g/dL — ABNORMAL HIGH (ref 12.0–15.0)
MCH: 32.1 pg (ref 26.0–34.0)
MCHC: 33.5 g/dL (ref 30.0–36.0)
MCV: 95.8 fL (ref 80.0–100.0)
Platelets: 278 10*3/uL (ref 150–400)
RBC: 4.77 MIL/uL (ref 3.87–5.11)
RDW: 13.5 % (ref 11.5–15.5)
WBC: 11.6 10*3/uL — ABNORMAL HIGH (ref 4.0–10.5)
nRBC: 0 % (ref 0.0–0.2)

## 2020-12-31 LAB — COMPREHENSIVE METABOLIC PANEL
ALT: 22 U/L (ref 0–44)
AST: 25 U/L (ref 15–41)
Albumin: 3.9 g/dL (ref 3.5–5.0)
Alkaline Phosphatase: 62 U/L (ref 38–126)
Anion gap: 7 (ref 5–15)
BUN: 11 mg/dL (ref 8–23)
CO2: 28 mmol/L (ref 22–32)
Calcium: 9.5 mg/dL (ref 8.9–10.3)
Chloride: 102 mmol/L (ref 98–111)
Creatinine, Ser: 0.69 mg/dL (ref 0.44–1.00)
GFR, Estimated: >60 mL/min (ref >60)
Glucose, Bld: 116 mg/dL — ABNORMAL HIGH (ref 70–99)
Potassium: 4 mmol/L (ref 3.5–5.1)
Sodium: 137 mmol/L (ref 135–145)
Total Bilirubin: 0.6 mg/dL (ref 0.3–1.2)
Total Protein: 6.7 g/dL (ref 6.5–8.1)

## 2020-12-31 LAB — BASIC METABOLIC PANEL
Anion gap: 8 (ref 5–15)
BUN: 12 mg/dL (ref 8–23)
CO2: 27 mmol/L (ref 22–32)
Calcium: 9.4 mg/dL (ref 8.9–10.3)
Chloride: 102 mmol/L (ref 98–111)
Creatinine, Ser: 0.7 mg/dL (ref 0.44–1.00)
GFR, Estimated: >60 mL/min (ref >60)
Glucose, Bld: 120 mg/dL — ABNORMAL HIGH (ref 70–99)
Potassium: 3.9 mmol/L (ref 3.5–5.1)
Sodium: 137 mmol/L (ref 135–145)

## 2020-12-31 LAB — CBG MONITORING, ED: Glucose-Capillary: 113 mg/dL — ABNORMAL HIGH (ref 70–99)

## 2020-12-31 IMAGING — CT CT HEAD W/O CM
4 series · 16 of 47 positions shown, 18 images · non-contrast
Comparison: [DATE] MR and [DATE]

CLINICAL DATA: 79-year-old male with weakness. History of melanoma
and brain metastases.

EXAM:
CT HEAD WITHOUT CONTRAST
TECHNIQUE: Contiguous axial images were obtained from the base of the skull
through the vertex without intravenous contrast.

[Series 3: head without · axial · non-contrast · 0.43mm/px · z∈[-113,+7]mm · 7 of 34 slices shown, 9 images]
[im 5/34  brain]
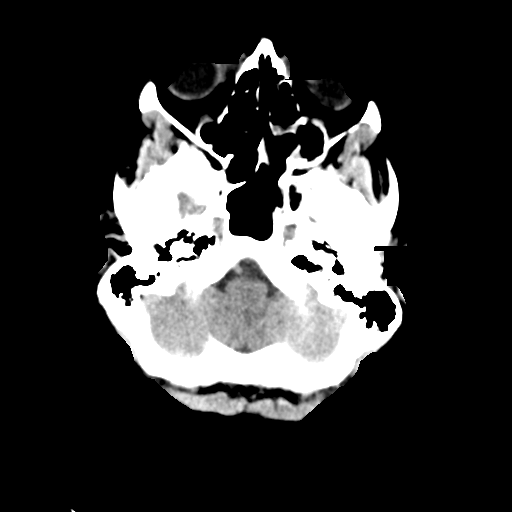
[im 5/34  bone]
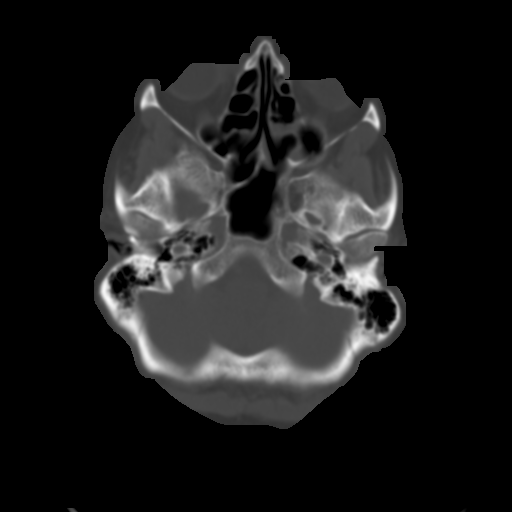
[im 9/34  brain]
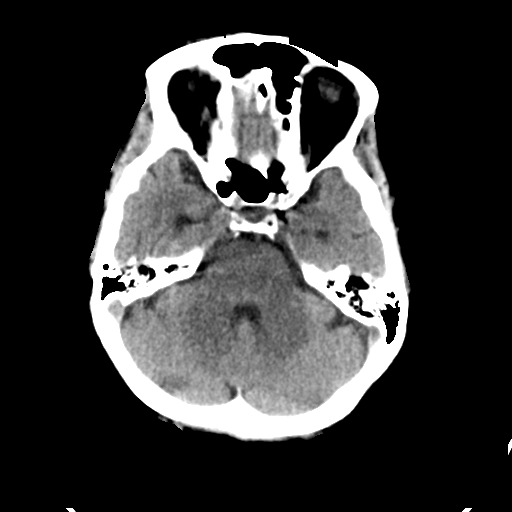
[im 13/34  brain]
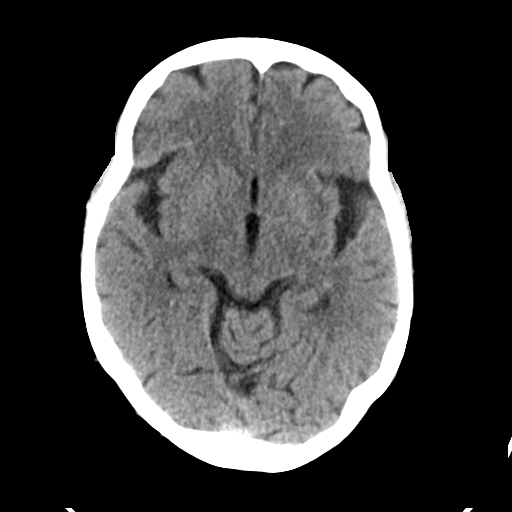
[im 17/34  brain]
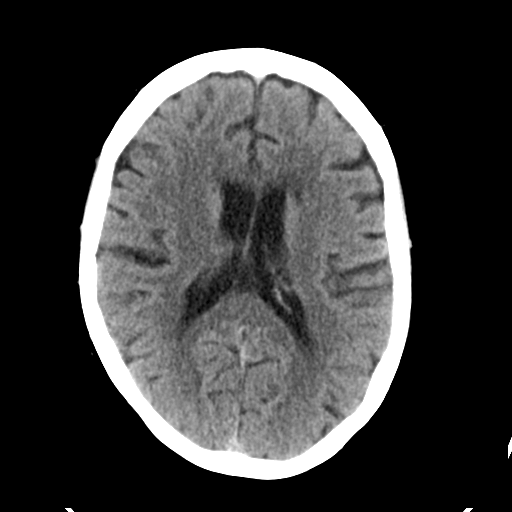
[im 21/34  brain]
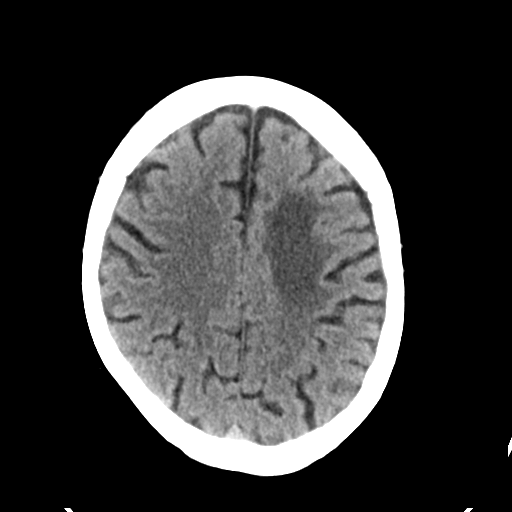
[im 21/34  bone]
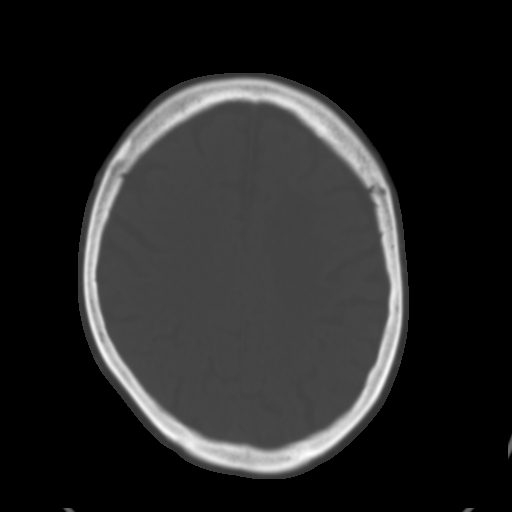
[im 25/34  brain]
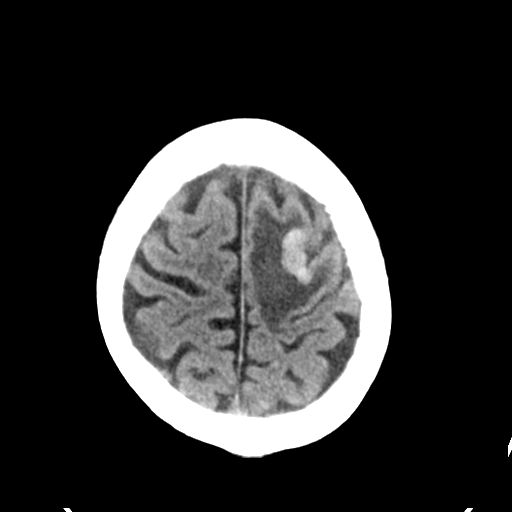
[im 29/34  brain]
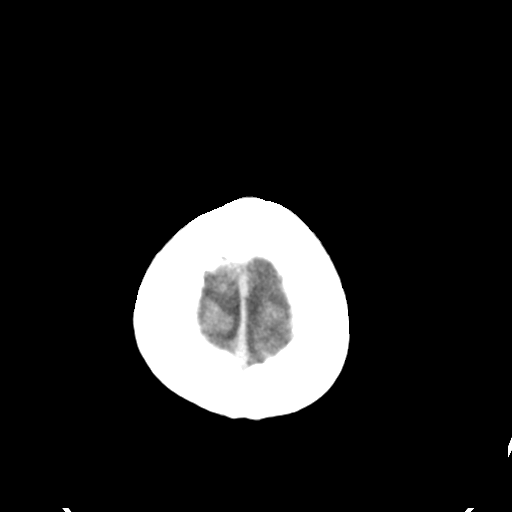

[Series 4: head bone · axial · 0.43mm/px · z∈[-117,-85]mm · 3 of 83 slices shown]
[im 9/83  bone]
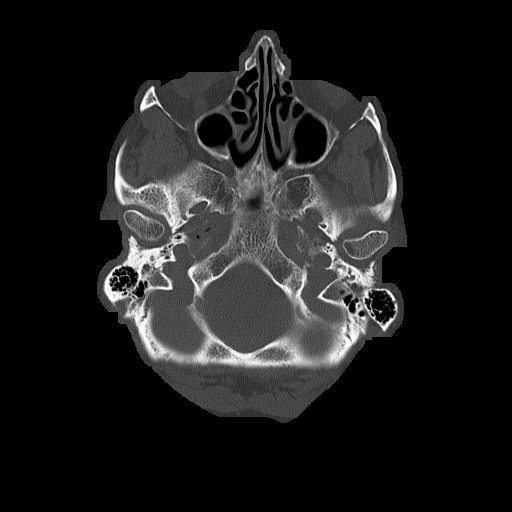
[im 17/83  bone]
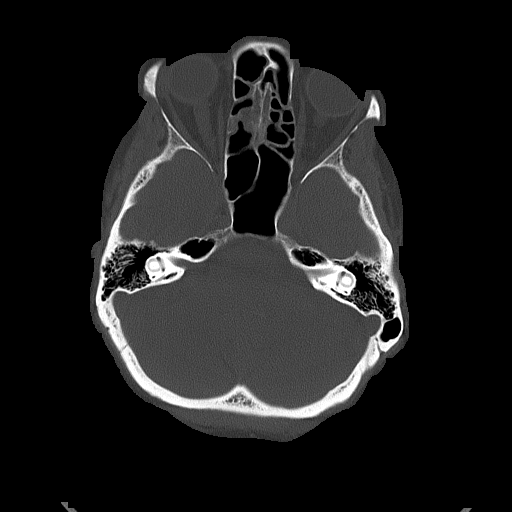
[im 25/83  bone]
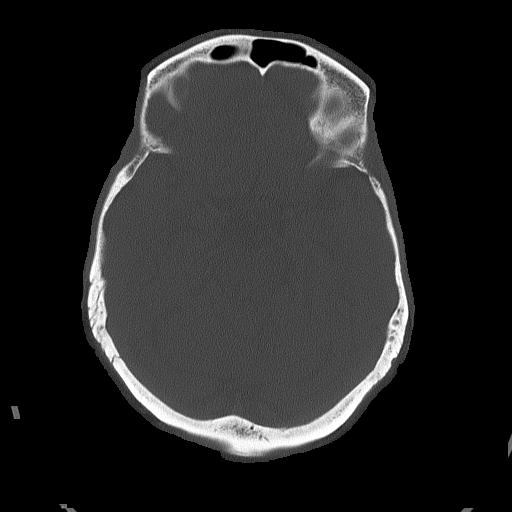

[Series 5: head without cor · coronal · non-contrast · 0.32mm/px · 3 of 72 slices shown]
[im 24/72  brain]
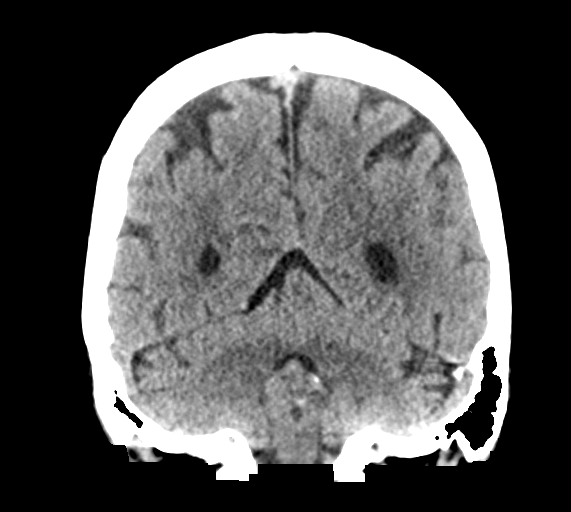
[im 32/72  brain]
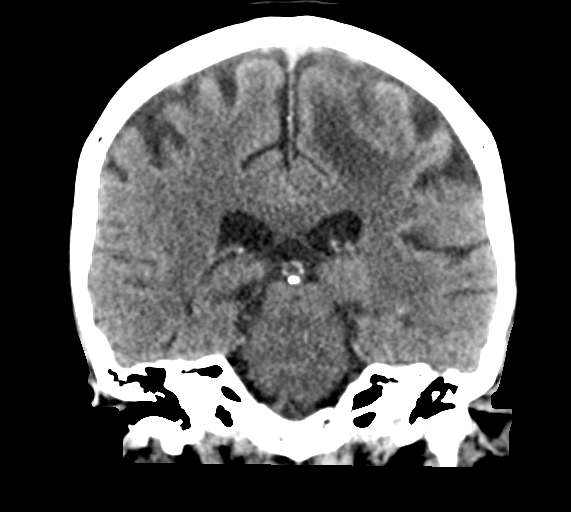
[im 40/72  brain]
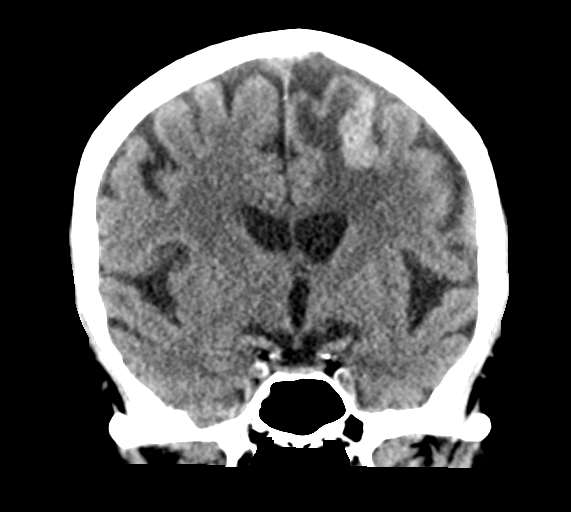

[Series 6: head without sag · sagittal · non-contrast · 0.32mm/px · 3 of 67 slices shown]
[im 23/67  brain]
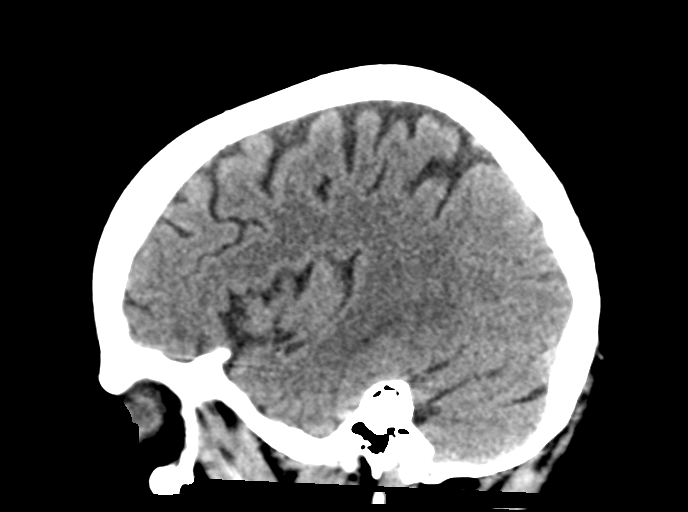
[im 34/67  brain]
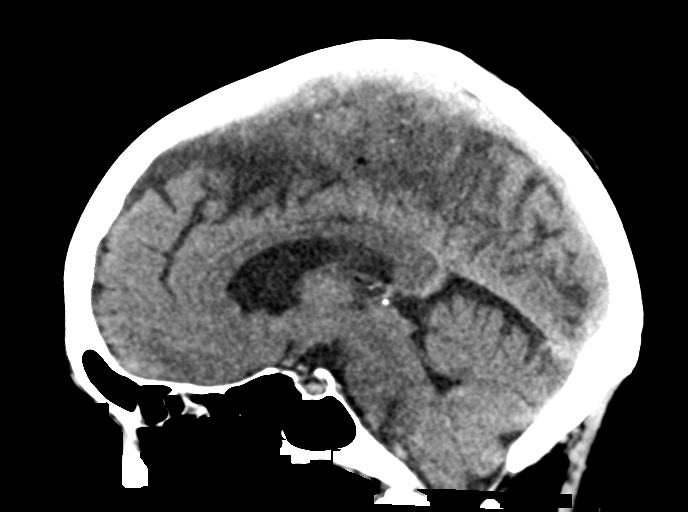
[im 45/67  brain]
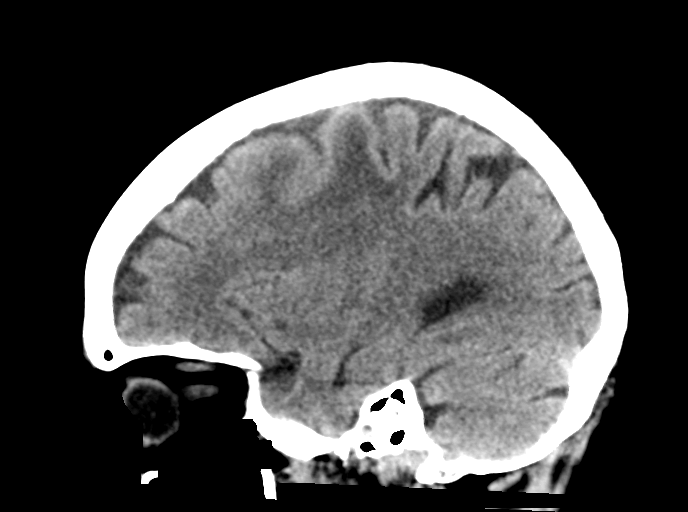

[16 of 47 positions shown; findings below may reference images not displayed]

FINDINGS: Brain: A 2.4 x 1.2 x 2.7 cm (AP x transverse x CC) hemorrhagic LEFT
frontal mass appears is unchanged from [DATE] but increased
adjacent edema noted.

No midline shift, hydrocephalus, new hemorrhage, extra-axial
collection or new mass.

Mild atrophy and mild chronic small-vessel white matter ischemic
changes are identified.

Vascular: No hyperdense vessel or unexpected calcification.

Skull: Normal. Negative for fracture or focal lesion.

Sinuses/Orbits: No acute finding. Mucosal thickening within
scattered ethmoid air cells noted.

Other: None
IMPRESSION: 1. Hemorrhagic LEFT frontal mass/metastasis with increased adjacent
vasogenic edema. No midline shift or hydrocephalus.
2. Mild atrophy and chronic small-vessel white matter ischemic
changes.

## 2020-12-31 MED ORDER — DEXAMETHASONE SODIUM PHOSPHATE 10 MG/ML IJ SOLN
10.0000 mg | Freq: Once | INTRAMUSCULAR | Status: AC
  Administered 2020-12-31: 10 mg via INTRAVENOUS
  Filled 2020-12-31: qty 1

## 2020-12-31 MED ORDER — DEXAMETHASONE 4 MG PO TABS: 4.0000 mg | ORAL_TABLET | Freq: Two times a day (BID) | ORAL | 0 refills | Status: DC

## 2020-12-31 NOTE — ED Notes (Signed)
Patient transported to CT 

## 2020-12-31 NOTE — ED Triage Notes (Signed)
Pt. Stated,I had a treatment for my tumor in my brain on Thursday, on Friday I had weakness in my rt. Arm . Its still there. My Dr. Michela Pitcher to come here.  Bilateral =grips, NO arm drift. symmetrical smile.

## 2020-12-31 NOTE — ED Notes (Signed)
Patient discharge instructions reviewed with the patient. The patient verbalized understanding of instructions. Patient discharged. 

## 2020-12-31 NOTE — ED Provider Notes (Signed)
[facility] EMERGENCY DEPARTMENT Provider Note   CSN: [id number] Arrival date & time: 12/31/20  0900     History Chief Complaint  Patient presents with  . Weakness    Angela Carpenter is a 80 y.o. female.  Pt presents to the ED today with right arm heaviness and weakness.  Pt has a hx of metastatic melanoma to the brain.  She had radiation treatment on 3/24.  She developed some "heaviness" to the right arm the next day and it has not improved.  Pt said she really notices it when she tries to write (she is right handed) or text or type.  Pt denies any other new neurologic symptoms.        Past Medical History:  Diagnosis Date  . A-fib ([place])   . Atrial fibrillation ([place])    4 years ago started  . Brain cancer ([place]) 12/2020  . Breast cancer ([place]) 2006  . Colon polyps   . Glaucoma   . Hypercholesteremia   . Hypertension    patient denies   . Osteoporosis   . Personal history of chemotherapy 2006  . Personal history of radiation therapy 2006  . Stroke (cerebrum) [facility]) 2015    Patient Active Problem List   Diagnosis Date Noted  . Brain metastasis ([place]) 12/12/2020  . Seizure disorder, focal motor ([place]) 12/12/2020  . ICH (intracerebral hemorrhage) ([place]) 12/11/2020  . Encounter for antineoplastic immunotherapy 06/30/2020  . Malignant melanoma of anal canal ([place]) 07/21/2019  . Hypertensive heart disease 12/04/2016  . HX: anticoagulation 12/04/2016  . PAF (paroxysmal atrial fibrillation) ([place]) 08/29/2015  . Hyperlipidemia 08/29/2015  . Atrial fibrillation ([place]) 08/28/2015    Past Surgical History:  Procedure Laterality Date  . AUGMENTATION MAMMAPLASTY Bilateral 2006   Patient had them removed in 2008  . BREAST BIOPSY Left 2016  . BREAST LUMPECTOMY Right 2006  . COLONOSCOPY     had polyps  . EYE SURGERY Right    cataract  . MELANOMA EXCISION WITH SENTINEL LYMPH NODE BIOPSY N/A 06/02/2019   Procedure: Left lateral ANAL MELANOMA EXCISION WITH  Bilateral Groin exploration and SENTINEL node mapping;  Surgeon: [name], MD;  Location: [facility];  Service: General;  Laterality: N/A;  . TONSILLECTOMY       OB History   No obstetric history on file.     Family History  Problem Relation Age of Onset  . Lung cancer Mother   . Hypotension Mother   . Pancreatic cancer Father   . Hypertension Sister   . Kidney cancer Brother   . Cirrhosis Maternal Grandfather   . Breast cancer Paternal Grandmother   . Cirrhosis Paternal Grandfather   . Breast cancer Paternal Aunt     Social History   Tobacco Use  . Smoking status: Passive Smoke Exposure - Never Smoker  . Smokeless tobacco: Never Used  . Tobacco comment: smoked in college but didn't inhale  Vaping Use  . Vaping Use: Never used  Substance Use Topics  . Alcohol use: No    Alcohol/week: 0.0 standard drinks  . Drug use: No    Home Medications Prior to Admission medications   Medication Sig Start Date End Date Taking? Authorizing Provider  dexamethasone (DECADRON) 4 MG tablet Take 1 tablet (4 mg total) by mouth 2 (two) times daily. 12/31/20  Yes Isla Pence, MD  acyclovir (ZOVIRAX) 400 MG tablet Take 400 mg by mouth 2 (two) times daily.    [provider]  alendronate (  FOSAMAX) 70 MG tablet Take 70 mg by mouth once a week. Take with a full glass of water on an empty stomach.    [provider]  apixaban (ELIQUIS) 5 MG TABS tablet  09/16/19   [provider]  aspirin EC 81 MG tablet Take 81 mg by mouth daily. Swallow whole.    [provider]  atorvastatin (LIPITOR) 10 MG tablet Take 1 tablet (10 mg total) by mouth daily. Patient taking differently: Take 10 mg by mouth every evening. 02/22/20   [name], MD  Cholecalciferol (VITAMIN D-3) 125 MCG (5000 UT) TABS Take 5,000 Units by mouth daily.     [provider]  levETIRAcetam (KEPPRA) 500 MG tablet Take 1 tablet (500 mg total) by mouth 2 (two) times daily. 12/12/20  01/11/21  Bhagat, Dutch Quint, MD  metoprolol succinate (TOPROL-XL) 25 MG 24 hr tablet Take 1 tablet (25 mg total) by mouth 2 (two) times daily. 02/22/20   [name], MD  Multiple Vitamins-Minerals (PRESERVISION AREDS 2+MULTI VIT) CAPS Take 1 capsule by mouth 2 (two) times daily.    [provider]  raloxifene (EVISTA) 60 MG tablet Take 60 mg by mouth in the morning.    [provider]  timolol (TIMOPTIC) 0.5 % ophthalmic solution Place 1 drop into both eyes 2 (two) times daily. 11/19/16   [provider]    Allergies    Patient has no known allergies.  Review of Systems   Review of Systems  Neurological:       Right arm weakness  All other systems reviewed and are negative.   Physical Exam Updated Vital Signs BP 125/69   Pulse 77   Temp 98.1 F (36.7 C) (Oral)   Resp 16   Ht 5\' 5"  (1.651 m)   Wt 67.5 kg   SpO2 100%   BMI 24.76 kg/m   Physical Exam Vitals and nursing note reviewed.  Constitutional:      Appearance: Normal appearance.  HENT:     Head: Normocephalic and atraumatic.     Right Ear: External ear normal.     Left Ear: External ear normal.     Nose: Nose normal.     Mouth/Throat:     Mouth: Mucous membranes are moist.     Pharynx: Oropharynx is clear.  Eyes:     Extraocular Movements: Extraocular movements intact.     Conjunctiva/sclera: Conjunctivae normal.     Pupils: Pupils are equal, round, and reactive to light.  Cardiovascular:     Rate and Rhythm: Normal rate and regular rhythm.     Pulses: Normal pulses.     Heart sounds: Normal heart sounds.  Pulmonary:     Effort: Pulmonary effort is normal.     Breath sounds: Normal breath sounds.  Abdominal:     General: Abdomen is flat. Bowel sounds are normal.     Palpations: Abdomen is soft.  Musculoskeletal:        General: Normal range of motion.     Cervical back: Normal range of motion and neck supple.  Skin:    General: Skin is warm.     Capillary Refill:  Capillary refill takes less than 2 seconds.  Neurological:     Mental Status: She is alert and oriented to person, place, and time.     Comments: No obvious gross weakness on exam, but she does have some fine motor deficits.  Psychiatric:        Mood and  Affect: Mood normal.        Behavior: Behavior normal.     ED Results / Procedures / Treatments   Labs (all labs ordered are listed, but only abnormal results are displayed) Labs Reviewed  BASIC METABOLIC PANEL - Abnormal; Notable for the following components:      Result Value   Glucose, Bld 120 (*)    All other components within normal limits  CBC - Abnormal; Notable for the following components:   WBC 11.6 (*)    Hemoglobin 15.3 (*)    All other components within normal limits  COMPREHENSIVE METABOLIC PANEL - Abnormal; Notable for the following components:   Glucose, Bld 116 (*)    All other components within normal limits  CBG MONITORING, ED - Abnormal; Notable for the following components:   Glucose-Capillary 113 (*)    All other components within normal limits  URINALYSIS, ROUTINE W REFLEX MICROSCOPIC    EKG EKG Interpretation  Date/Time:  Sunday December 31 2020 09:06:01 EDT Ventricular Rate:  87 PR Interval:  148 QRS Duration: 74 QT Interval:  394 QTC Calculation: 474 R Axis:   92 Text Interpretation: Normal sinus rhythm Rightward axis ST & T wave abnormality, consider inferolateral ischemia Prolonged QT Abnormal ECG t wave inversions are new Confirmed by Isla Pence [phone]) on 12/31/2020 10:44:27 AM   Radiology CT Head Wo Contrast  Result Date: 12/31/2020 CLINICAL DATA:  80 year old female with weakness. History of melanoma and brain metastases. EXAM: CT HEAD WITHOUT CONTRAST TECHNIQUE: Contiguous axial images were obtained from the base of the skull through the vertex without intravenous contrast. COMPARISON:  12/20/2020 MR and 12/11/2020 FINDINGS: Brain: A 2.4 x 1.2 x 2.7 cm (AP x transverse x CC) hemorrhagic  LEFT frontal mass appears is unchanged from 12/20/2020 but increased adjacent edema noted. No midline shift, hydrocephalus, new hemorrhage, extra-axial collection or new mass. Mild atrophy and mild chronic small-vessel white matter ischemic changes are identified. Vascular: No hyperdense vessel or unexpected calcification. Skull: Normal. Negative for fracture or focal lesion. Sinuses/Orbits: No acute finding. Mucosal thickening within scattered ethmoid air cells noted. Other: None IMPRESSION: 1. Hemorrhagic LEFT frontal mass/metastasis with increased adjacent vasogenic edema. No midline shift or hydrocephalus. 2. Mild atrophy and chronic small-vessel white matter ischemic changes. Electronically Signed   By: Margarette Canada M.D.   On: 12/31/2020 10:50    Procedures Procedures   Medications Ordered in ED Medications  dexamethasone (DECADRON) injection 10 mg (has no administration in time range)    ED Course  I have reviewed the triage vital signs and the nursing notes.  Pertinent labs & imaging results that were available during my care of the patient were reviewed by me and considered in my medical decision making (see chart for details).    MDM Rules/Calculators/A&P                          Pt has increasing vasogenic edema on CT scan.  I spoke with Dr. Mickeal Skinner who recommended 10 mg decadron IV now and d/c home on 4 mg bid.  His office will call for close f/u.  Pt is very glad to be able to go home.  Pt is stable for d/c.  Return if worse.  Final Clinical Impression(s) / ED Diagnoses Final diagnoses:  Vasogenic edema ([place])    Rx / DC Orders ED Discharge Orders         Ordered    dexamethasone (DECADRON) 4 MG  tablet  2 times daily        12/31/20 1125           Isla Pence, MD 12/31/20 1128

## 2020-12-31 NOTE — ED Notes (Signed)
Pt not transported to CT.

## 2021-01-01 ENCOUNTER — Telehealth: Payer: Self-pay | Admitting: Radiation Oncology

## 2021-01-01 ENCOUNTER — Telehealth: Payer: Self-pay | Admitting: Internal Medicine

## 2021-01-01 NOTE — Progress Notes (Signed)
  Patient Name: Angela Carpenter MRN: [id number] DOB: 1941-03-01 Referring Physician: Domenick Gong (Profile Not Attached) Date of Service: 12/28/2020 [facility] [place], [place]                                                        End Of Treatment Note  Diagnoses: C79.31-Secondary malignant neoplasm of brain  Cancer Staging: Stage IV mucosal melanoma arising in the anal canal now with brain metastasis.  Intent: Palliative  Radiation Treatment Dates: 12/28/2020 through 12/28/2020 Site Technique Total Dose (Gy) Dose per Fx (Gy) Completed Fx Beam Energies  Brain: Brain IMRT 18/18 18 1/1 [id number]   Narrative: The patient tolerated radiation therapy relatively well without untoward side effects at the completion of treatment  Plan: The patient will receive a call in about one month from the radiation oncology department. She will continue follow up with Dr. Alen Blew as well.   ________________________________________________    Carola Rhine, [facility]

## 2021-01-01 NOTE — Telephone Encounter (Signed)
Scheduled appt per 3/28 sch msg. Pt aware.  

## 2021-01-01 NOTE — Telephone Encounter (Signed)
I called the patient and overall she is feeling a bit better, still does feel that she has limited strength and use of her right arm and fingertips. She has some right lower extremity foot drop that is worse than at baseline, but overall she feels better than she did the last three days since treatment was given. She is taking dexamethasone and will follow up with Dr. Mickeal Skinner on Thursday. I encouraged her to call back if her symptoms were progressive and she is in agreement with this plan.    Carola Rhine, PAC

## 2021-01-04 ENCOUNTER — Inpatient Hospital Stay: Payer: Medicare Other

## 2021-01-04 ENCOUNTER — Inpatient Hospital Stay (HOSPITAL_BASED_OUTPATIENT_CLINIC_OR_DEPARTMENT_OTHER): Payer: Medicare Other | Admitting: Internal Medicine

## 2021-01-04 ENCOUNTER — Other Ambulatory Visit: Payer: Self-pay

## 2021-01-04 VITALS — BP 133/71 | HR 81 | Temp 97.5°F | Resp 18 | Ht 65.0 in | Wt 148.8 lb

## 2021-01-04 DIAGNOSIS — C7931 Secondary malignant neoplasm of brain: Secondary | ICD-10-CM

## 2021-01-04 DIAGNOSIS — G40109 Localization-related (focal) (partial) symptomatic epilepsy and epileptic syndromes with simple partial seizures, not intractable, without status epilepticus: Secondary | ICD-10-CM | POA: Diagnosis not present

## 2021-01-04 DIAGNOSIS — C211 Malignant neoplasm of anal canal: Secondary | ICD-10-CM

## 2021-01-04 DIAGNOSIS — Z5112 Encounter for antineoplastic immunotherapy: Secondary | ICD-10-CM | POA: Diagnosis not present

## 2021-01-04 DIAGNOSIS — Z7952 Long term (current) use of systemic steroids: Secondary | ICD-10-CM | POA: Diagnosis not present

## 2021-01-04 DIAGNOSIS — E039 Hypothyroidism, unspecified: Secondary | ICD-10-CM

## 2021-01-04 DIAGNOSIS — Z853 Personal history of malignant neoplasm of breast: Secondary | ICD-10-CM | POA: Diagnosis not present

## 2021-01-04 DIAGNOSIS — Z79899 Other long term (current) drug therapy: Secondary | ICD-10-CM | POA: Diagnosis not present

## 2021-01-04 LAB — CMP (CANCER CENTER ONLY)
ALT: 25 U/L (ref 0–44)
AST: 18 U/L (ref 15–41)
Albumin: 4.1 g/dL (ref 3.5–5.0)
Alkaline Phosphatase: 77 U/L (ref 38–126)
Anion gap: 12 (ref 5–15)
BUN: 15 mg/dL (ref 8–23)
CO2: 25 mmol/L (ref 22–32)
Calcium: 9 mg/dL (ref 8.9–10.3)
Chloride: 99 mmol/L (ref 98–111)
Creatinine: 0.74 mg/dL (ref 0.44–1.00)
GFR, Estimated: >60 mL/min (ref >60)
Glucose, Bld: 137 mg/dL — ABNORMAL HIGH (ref 70–99)
Potassium: 4.3 mmol/L (ref 3.5–5.1)
Sodium: 136 mmol/L (ref 135–145)
Total Bilirubin: 0.4 mg/dL (ref 0.3–1.2)
Total Protein: 6.9 g/dL (ref 6.5–8.1)

## 2021-01-04 LAB — CBC WITH DIFFERENTIAL (CANCER CENTER ONLY)
Abs Immature Granulocytes: 0.27 10*3/uL — ABNORMAL HIGH (ref 0.00–0.07)
Basophils Absolute: 0 10*3/uL (ref 0.0–0.1)
Basophils Relative: 0 %
Eosinophils Absolute: 0 10*3/uL (ref 0.0–0.5)
Eosinophils Relative: 0 %
HCT: 42.4 % (ref 36.0–46.0)
Hemoglobin: 14 g/dL (ref 12.0–15.0)
Immature Granulocytes: 2 %
Lymphocytes Relative: 8 %
Lymphs Abs: 1.3 10*3/uL (ref 0.7–4.0)
MCH: 31 pg (ref 26.0–34.0)
MCHC: 33 g/dL (ref 30.0–36.0)
MCV: 93.8 fL (ref 80.0–100.0)
Monocytes Absolute: 0.7 10*3/uL (ref 0.1–1.0)
Monocytes Relative: 4 %
Neutro Abs: 14.9 10*3/uL — ABNORMAL HIGH (ref 1.7–7.7)
Neutrophils Relative %: 86 %
Platelet Count: 306 10*3/uL (ref 150–400)
RBC: 4.52 MIL/uL (ref 3.87–5.11)
RDW: 13.6 % (ref 11.5–15.5)
WBC Count: 17.3 10*3/uL — ABNORMAL HIGH (ref 4.0–10.5)
nRBC: 0 % (ref 0.0–0.2)

## 2021-01-04 LAB — TSH: TSH: 1.734 u[IU]/mL (ref 0.308–3.960)

## 2021-01-04 MED ORDER — DEXAMETHASONE 1 MG PO TABS: 1.0000 mg | ORAL_TABLET | Freq: Every day | ORAL | 1 refills | Status: DC

## 2021-01-04 MED ORDER — SODIUM CHLORIDE 0.9 % IV SOLN
Freq: Once | INTRAVENOUS | Status: AC
  Filled 2021-01-04: qty 250

## 2021-01-04 MED ORDER — SODIUM CHLORIDE 0.9 % IV SOLN
200.0000 mg | Freq: Once | INTRAVENOUS | Status: AC
  Administered 2021-01-04: 200 mg via INTRAVENOUS
  Filled 2021-01-04: qty 8

## 2021-01-04 MED ORDER — HEPARIN SOD (PORK) LOCK FLUSH 100 UNIT/ML IV SOLN
500.0000 [IU] | Freq: Once | INTRAVENOUS | Status: DC | PRN
  Filled 2021-01-04: qty 5

## 2021-01-04 MED ORDER — SODIUM CHLORIDE 0.9% FLUSH
10.0000 mL | INTRAVENOUS | Status: DC | PRN
  Filled 2021-01-04: qty 10

## 2021-01-04 NOTE — Patient Instructions (Signed)
[facility] Discharge Instructions for Patients Receiving Chemotherapy  Today you received the following chemotherapy agent: Pembrolizumab.  To help prevent nausea and vomiting after your treatment, we encourage you to take your nausea medication as directed.   If you develop nausea and vomiting that is not controlled by your nausea medication, call the clinic.   BELOW ARE SYMPTOMS THAT SHOULD BE REPORTED IMMEDIATELY:  *FEVER GREATER THAN 100.5 F  *CHILLS WITH OR WITHOUT FEVER  NAUSEA AND VOMITING THAT IS NOT CONTROLLED WITH YOUR NAUSEA MEDICATION  *UNUSUAL SHORTNESS OF BREATH  *UNUSUAL BRUISING OR BLEEDING  TENDERNESS IN MOUTH AND THROAT WITH OR WITHOUT PRESENCE OF ULCERS  *URINARY PROBLEMS  *BOWEL PROBLEMS  UNUSUAL RASH Items with * indicate a potential emergency and should be followed up as soon as possible.  Feel free to call the clinic should you have any questions or concerns. The clinic phone number is [phone].  Please show the CHEMO ALERT CARD at check-in to the Emergency Department and triage nurse.   

## 2021-01-04 NOTE — Progress Notes (Signed)
[employer] at [place], [place] [address] [phone]   Interval Evaluation  Date of Service: 01/04/21 Patient Name: Angela Carpenter Patient MRN: [id number] Patient DOB: 03-19-1941 Provider: [name], MD  Identifying Statement:  Angela Carpenter is a 80 y.o. female with Brain metastasis ([place]) [C79.31] w  Primary Cancer:  Oncologic History: Oncology History  Malignant melanoma of anal canal ([place])  07/21/2019 Initial Diagnosis   Malignant melanoma of anal canal ([place])   07/29/2019 -  Chemotherapy      Patient is on Antibody Plan: HEAD/NECK PEMBROLIZUMAB Q21D     CNS Oncologic History 12/28/20: SRS to L frontal metastasis  Interval History:  Angela Carpenter for follow up after recent ED visit for right sided weakness.  She describes 2 days history of progressive right arm weakness, immediately following radiosurgery on 3/24.  She obtained a CT head in the ED.  She was started on decadron 50m twice per day, which has returned her back very nearly to normal function.  Complains of very slight weakness only.  No seizure described in this time period.  Otherwise no other complaints, no new deficits since the weekend.  H+P (12/15/20) Patient presented to medical attention last week with new onset seizure, described as "right arm shaking, followed by loss of consciousness and whole body shaking".  CNS imaging demonstrated an enhancing and hemorrhagic left frontal mass, c/w melanoma met.  She was started on Keppra and discharged to home after return to baseline.  At this time, she is concerned about her Eliquis, which was stopped while in the hospital.  She has no other complaints Carpenter, no recurrence of seizures since discharge.  Planning radiosurgery for the metastasis.  Medications: Current Outpatient Medications on File Prior to Visit  Medication Sig Dispense Refill  . acyclovir (ZOVIRAX) 400 MG tablet Take 400 mg by  mouth 2 (two) times daily.    .Marland Kitchenalendronate (FOSAMAX) 70 MG tablet Take 70 mg by mouth once a week. Take with a full glass of water on an empty stomach.    .Marland Kitchenapixaban (ELIQUIS) 5 MG TABS tablet  (Patient not taking: No sig reported)    . aspirin EC 81 MG tablet Take 81 mg by mouth daily. Swallow whole.    .Marland Kitchenatorvastatin (LIPITOR) 10 MG tablet Take 1 tablet (10 mg total) by mouth daily. (Patient taking differently: Take 10 mg by mouth every evening.) 90 tablet 3  . Cholecalciferol (VITAMIN D-3) 125 MCG (5000 UT) TABS Take 5,000 Units by mouth daily.     .Marland Kitchendexamethasone (DECADRON) 4 MG tablet Take 1 tablet (4 mg total) by mouth 2 (two) times daily. 14 tablet 0  . levETIRAcetam (KEPPRA) 500 MG tablet Take 1 tablet (500 mg total) by mouth 2 (two) times daily. 60 tablet 0  . metoprolol succinate (TOPROL-XL) 25 MG 24 hr tablet Take 1 tablet (25 mg total) by mouth 2 (two) times daily. 180 tablet 3  . Multiple Vitamins-Minerals (PRESERVISION AREDS 2+MULTI VIT) CAPS Take 1 capsule by mouth 2 (two) times daily.    . raloxifene (EVISTA) 60 MG tablet Take 60 mg by mouth in the morning.    . timolol (TIMOPTIC) 0.5 % ophthalmic solution Place 1 drop into both eyes 2 (two) times daily.     Current Facility-Administered Medications on File Prior to Visit  Medication Dose Route Frequency Provider Last Rate Last Admin  . 0.9 %  sodium chloride  infusion  500 mL Intravenous Continuous Danis, Kirke Corin, MD        Allergies: No Known Allergies Past Medical History:  Past Medical History:  Diagnosis Date  . A-fib ([place])   . Atrial fibrillation ([place])    4 years ago started  . Brain cancer ([name]) 12/2020  . Breast cancer ([place]) 2006  . Colon polyps   . Glaucoma   . Hypercholesteremia   . Hypertension    patient denies   . Osteoporosis   . Personal history of chemotherapy 2006  . Personal history of radiation therapy 2006  . Stroke (cerebrum) ([place]) 2015   Past Surgical History:  Past Surgical History:   Procedure Laterality Date  . AUGMENTATION MAMMAPLASTY Bilateral 2006   Patient had them removed in 2008  . BREAST BIOPSY Left 2016  . BREAST LUMPECTOMY Right 2006  . COLONOSCOPY     had polyps  . EYE SURGERY Right    cataract  . MELANOMA EXCISION WITH SENTINEL LYMPH NODE BIOPSY N/A 06/02/2019   Procedure: Left lateral ANAL MELANOMA EXCISION WITH Bilateral Groin exploration and SENTINEL node mapping;  Surgeon: [name], MD;  Location: [facility];  Service: General;  Laterality: N/A;  . TONSILLECTOMY     Social History:  Social History   Socioeconomic History  . Marital status: Widowed    Spouse name: Not on file  . Number of children: 1  . Years of education: Not on file  . Highest education level: Not on file  Occupational History  . Occupation: retired  Tobacco Use  . Smoking status: Passive Smoke Exposure - Never Smoker  . Smokeless tobacco: Never Used  . Tobacco comment: smoked in college but didn't inhale  Vaping Use  . Vaping Use: Never used  Substance and Sexual Activity  . Alcohol use: No    Alcohol/week: 0.0 standard drinks  . Drug use: No  . Sexual activity: Not on file  Other Topics Concern  . Not on file  Social History Narrative  . Not on file   Social Determinants of Health   Financial Resource Strain: Not on file  Food Insecurity: Not on file  Transportation Needs: Not on file  Physical Activity: Not on file  Stress: Not on file  Social Connections: Not on file  Intimate Partner Violence: Not on file   Family History:  Family History  Problem Relation Age of Onset  . Lung cancer Mother   . Hypotension Mother   . Pancreatic cancer Father   . Hypertension Sister   . Kidney cancer Brother   . Cirrhosis Maternal Grandfather   . Breast cancer Paternal Grandmother   . Cirrhosis Paternal Grandfather   . Breast cancer Paternal Aunt     Review of Systems: Constitutional: Doesn't report fevers, chills or abnormal weight loss Eyes: Doesn't report  blurriness of vision Ears, nose, mouth, throat, and face: Doesn't report sore throat Respiratory: Doesn't report cough, dyspnea or wheezes Cardiovascular: Doesn't report palpitation, chest discomfort  Gastrointestinal:  Doesn't report nausea, constipation, diarrhea GU: Doesn't report incontinence Skin: Doesn't report skin rashes Neurological: Per HPI Musculoskeletal: Doesn't report joint pain Behavioral/Psych: Doesn't report anxiety  Physical Exam: Vitals:   01/04/21 1226  BP: 133/71  Pulse: 81  Resp: 18  Temp: (!) 97.5 F (36.4 C)  SpO2: 100%   KPS: 90. General: Alert, cooperative, pleasant, in no acute distress Head: Normal EENT: No conjunctival injection or scleral icterus.  Lungs: Resp effort normal Cardiac: Regular rate Abdomen: Non-distended  abdomen Skin: No rashes cyanosis or petechiae. Extremities: No clubbing or edema  Neurologic Exam: Mental Status: Awake, alert, attentive to examiner. Oriented to self and environment. Language is fluent with intact comprehension.  Cranial Nerves: Visual acuity is grossly normal. Visual fields are full. Extra-ocular movements intact. No ptosis. Face is symmetric Motor: Tone and bulk are normal. Subtle pronator drift on right, some fine motor impairment R hand. Reflexes are symmetric, no pathologic reflexes present.  Sensory: Intact to light touch Gait: Normal.   Labs: I have reviewed the data as listed    Component Value Date/Time   NA 137 12/31/2020 0948   NA 138 12/05/2016 0750   K 4.0 12/31/2020 0948   CL 102 12/31/2020 0948   CO2 28 12/31/2020 0948   GLUCOSE 116 (H) 12/31/2020 0948   BUN 11 12/31/2020 0948   BUN 15 12/05/2016 0750   CREATININE 0.69 12/31/2020 0948   CREATININE 0.71 12/20/2020 1503   CREATININE 0.70 09/05/2015 0828   CALCIUM 9.5 12/31/2020 0948   PROT 6.7 12/31/2020 0948   PROT 6.6 12/05/2016 0750   ALBUMIN 3.9 12/31/2020 0948   ALBUMIN 4.4 12/05/2016 0750   AST 25 12/31/2020 0948   AST 24  12/20/2020 1503   ALT 22 12/31/2020 0948   ALT 39 12/20/2020 1503   ALKPHOS 62 12/31/2020 0948   BILITOT 0.6 12/31/2020 0948   BILITOT 0.4 12/20/2020 1503   GFRNONAA >60 12/31/2020 0948   GFRNONAA >60 12/20/2020 1503   GFRAA >60 06/30/2020 0840   Lab Results  Component Value Date   WBC 11.6 (H) 12/31/2020   NEUTROABS 6.3 12/20/2020   HGB 15.3 (H) 12/31/2020   HCT 45.7 12/31/2020   MCV 95.8 12/31/2020   PLT 278 12/31/2020    Imaging:  CT Head Wo Contrast  Result Date: 12/31/2020 CLINICAL DATA:  80 year old female with weakness. History of melanoma and brain metastases. EXAM: CT HEAD WITHOUT CONTRAST TECHNIQUE: Contiguous axial images were obtained from the base of the skull through the vertex without intravenous contrast. COMPARISON:  12/20/2020 MR and 12/11/2020 FINDINGS: Brain: A 2.4 x 1.2 x 2.7 cm (AP x transverse x CC) hemorrhagic LEFT frontal mass appears is unchanged from 12/20/2020 but increased adjacent edema noted. No midline shift, hydrocephalus, new hemorrhage, extra-axial collection or new mass. Mild atrophy and mild chronic small-vessel white matter ischemic changes are identified. Vascular: No hyperdense vessel or unexpected calcification. Skull: Normal. Negative for fracture or focal lesion. Sinuses/Orbits: No acute finding. Mucosal thickening within scattered ethmoid air cells noted. Other: None IMPRESSION: 1. Hemorrhagic LEFT frontal mass/metastasis with increased adjacent vasogenic edema. No midline shift or hydrocephalus. 2. Mild atrophy and chronic small-vessel white matter ischemic changes. Electronically Signed   By: Margarette Canada M.D.   On: 12/31/2020 10:50   MR ANGIO HEAD WO CONTRAST  Result Date: 12/12/2020 CLINICAL DATA:  Stroke follow-up EXAM: MRI HEAD WITHOUT AND WITH CONTRAST MRA HEAD WITHOUT CONTRAST TECHNIQUE: Multiplanar, multiecho pulse sequences of the brain and surrounding structures were obtained without and with intravenous contrast. Angiographic images of  the head were obtained using MRA technique without contrast. CONTRAST:  7.61m GADAVIST GADOBUTROL 1 MMOL/ML IV SOLN COMPARISON:  Head CT 12/11/2020 FINDINGS: MRI HEAD FINDINGS Brain: Unchanged area of hemorrhage in the left frontal lobe with surrounding edema. No abnormal diffusion restriction. 19 mm contrast enhancing lesion at the site, which also has some elements of intrinsic hyperintense T1-weighted signal. There is multifocal hyperintense T2-weighted signal within the white matter. Generalized volume loss without a  clear lobar predilection. Vascular: Major flow voids are preserved. Skull and upper cervical spine: Normal calvarium and skull base. Visualized upper cervical spine and soft tissues are normal. Sinuses/Orbits:No paranasal sinus fluid levels or advanced mucosal thickening. No mastoid or middle ear effusion. Normal orbits. MRA HEAD FINDINGS POSTERIOR CIRCULATION: --Vertebral arteries: Normal --Inferior cerebellar arteries: Normal. --Basilar artery: Normal. --Superior cerebellar arteries: Normal. --Posterior cerebral arteries: Normal. ANTERIOR CIRCULATION: --Intracranial internal carotid arteries: Normal. --Anterior cerebral arteries (ACA): Normal. --Middle cerebral arteries (MCA): Chronic left MCA occlusion. Normal right. ANATOMIC VARIANTS: Fetal origin of the right PCA. Shared origin of the left PCA. IMPRESSION: 1. Hemorrhagic metastasis in the left frontal lobe with surrounding edema. 2. No acute intracranial abnormality. 3. Chronic left MCA occlusion. Electronically Signed   By: Ulyses Jarred M.D.   On: 12/12/2020 01:00   MR Brain W Wo Contrast  Result Date: 12/21/2020 CLINICAL DATA:  Melanoma, breast cancer with brain metastasis; treatment planning EXAM: MRI HEAD WITHOUT AND WITH CONTRAST TECHNIQUE: Multiplanar, multiecho pulse sequences of the brain and surrounding structures were obtained without and with intravenous contrast. CONTRAST:  76m MULTIHANCE GADOBENATE DIMEGLUMINE 529 MG/ML IV  SOLN COMPARISON:  12/12/2020 noncontrast study FINDINGS: Brain: There is a is 2 x 1.4 x 2.4 cm enhancing lesion of the left frontal lobe involving superior frontal and precentral gyri. Associated T1 shortening and susceptibility. Surrounding edema is present without significant mass effect. No additional mass or abnormal enhancement. Patchy foci of T2 hyperintensity in the supratentorial white matter nonspecific but may reflect mild chronic microvascular ischemic changes. There are chronic small vessel infarcts of the left corona radiata and basal ganglia. There is no hydrocephalus or extra-axial fluid collection. Vascular: Major vessel flow voids at the skull base are preserved. Skull and upper cervical spine: Normal marrow signal is preserved. Sinuses/Orbits: Mild to moderate mucosal thickening. No significant orbital abnormality. Other: Sella is unremarkable.  Mastoid air cells are clear. IMPRESSION: Single left frontal metastasis. Edema is present without significant mass effect. Electronically Signed   By: PMacy MisM.D.   On: 12/21/2020 08:18   MR BRAIN W WO CONTRAST  Result Date: 12/12/2020 CLINICAL DATA:  Stroke follow-up EXAM: MRI HEAD WITHOUT AND WITH CONTRAST MRA HEAD WITHOUT CONTRAST TECHNIQUE: Multiplanar, multiecho pulse sequences of the brain and surrounding structures were obtained without and with intravenous contrast. Angiographic images of the head were obtained using MRA technique without contrast. CONTRAST:  7.534mGADAVIST GADOBUTROL 1 MMOL/ML IV SOLN COMPARISON:  Head CT 12/11/2020 FINDINGS: MRI HEAD FINDINGS Brain: Unchanged area of hemorrhage in the left frontal lobe with surrounding edema. No abnormal diffusion restriction. 19 mm contrast enhancing lesion at the site, which also has some elements of intrinsic hyperintense T1-weighted signal. There is multifocal hyperintense T2-weighted signal within the white matter. Generalized volume loss without a clear lobar predilection.  Vascular: Major flow voids are preserved. Skull and upper cervical spine: Normal calvarium and skull base. Visualized upper cervical spine and soft tissues are normal. Sinuses/Orbits:No paranasal sinus fluid levels or advanced mucosal thickening. No mastoid or middle ear effusion. Normal orbits. MRA HEAD FINDINGS POSTERIOR CIRCULATION: --Vertebral arteries: Normal --Inferior cerebellar arteries: Normal. --Basilar artery: Normal. --Superior cerebellar arteries: Normal. --Posterior cerebral arteries: Normal. ANTERIOR CIRCULATION: --Intracranial internal carotid arteries: Normal. --Anterior cerebral arteries (ACA): Normal. --Middle cerebral arteries (MCA): Chronic left MCA occlusion. Normal right. ANATOMIC VARIANTS: Fetal origin of the right PCA. Shared origin of the left PCA. IMPRESSION: 1. Hemorrhagic metastasis in the left frontal lobe with surrounding edema. 2. No acute  intracranial abnormality. 3. Chronic left MCA occlusion. Electronically Signed   By: Ulyses Jarred M.D.   On: 12/12/2020 01:00   EEG adult  Result Date: 12/12/2020 Lora Havens, MD     12/12/2020  8:42 AM Patient Name: SILENA WYSS MRN: [id number] Epilepsy Attending: Lora Havens Referring Physician/Provider: Dr Lesleigh Noe Date: 12/12/2020 Duration: 21.08 mins Patient history: 80yo F with right arm shaking. EEG to evaluate for seizure Level of alertness: Awake, drowsy, sleep, comatose, lethargic AEDs during EEG study: LEV Technical aspects: This EEG study was done with scalp electrodes positioned according to the 10-20 International system of electrode placement. Electrical activity was acquired at a sampling rate of '500Hz'  and reviewed with a high frequency filter of '70Hz'  and a low frequency filter of '1Hz' . EEG data were recorded continuously and digitally stored. Description: The posterior dominant rhythm consists of 9 Hz activity of moderate voltage (25-35 uV) seen predominantly in posterior head regions, symmetric and reactive to eye  opening and eye closing. Hyperventilation and photic stimulation were not performed.   IMPRESSION: This study is within normal limits. No seizures or epileptiform discharges were seen throughout the recording. Lora Havens   CT HEAD CODE STROKE WO CONTRAST  Result Date: 12/11/2020 CLINICAL DATA:  Code stroke. Left-sided weakness and slurred speech. History of melanoma. EXAM: CT HEAD WITHOUT CONTRAST TECHNIQUE: Contiguous axial images were obtained from the base of the skull through the vertex without intravenous contrast. COMPARISON:  03/03/2016 FINDINGS: Brain: There is a 2.0 x 1.4 cm hyperdense lesion involving cortex/gray-white junction in the high posterior left frontal lobe most suspicious for a hemorrhagic mass with this clinical history. Low density in the surrounding white matter is consistent with mild-to-moderate vasogenic edema. No acute cortically based infarct is identified separate from this lesion. Chronic lacunar infarcts involving the left basal ganglia and periventricular white matter are unchanged. There is no midline shift or extra-axial fluid collection. Vascular: Calcified atherosclerosis at the skull base. No hyperdense vessel. Skull: No fracture or suspicious osseous lesion. Sinuses/Orbits: Moderate bilateral ethmoid and mild bilateral maxillary sinus mucosal thickening. Clear mastoid air cells. Right cataract extraction. Other: None. ASPECTS [address] [facility] Stroke Program Early CT Score) Not scored given the hemorrhagic mass. IMPRESSION: 1. 2 cm lesion in the posterior left frontal lobe likely reflecting a hemorrhagic melanoma metastasis. Mild-to-moderate vasogenic edema without mass effect. 2. Chronic lacunar infarcts in the left basal ganglia and periventricular white matter. These results were communicated to Dr. Curly Shores at 5:20 pm on 12/11/2020 by text page via the [facility] messaging system. Electronically Signed   By: Logan Bores M.D.   On: 12/11/2020 17:21     Assessment/Plan Brain  metastasis ([place]) [C79.31]  MARYAM FEELY presents Carpenter with clinical syndrome consistent with left frontal lobe dysfunction, secondary to acute post-SRS inflammatory reaction.  This is demonstrated on CT head in addition to clinical presentation.  We recommended beginning de-escalation of corticosteroids as follows: -Reduce to 29m daily starting Carpenter, for 5 days -Then reduce to 254mdaily x5, days, then 83m52maily x5 days -Then STOP  If right sided weakness recurs during the taper, we will bump up to the lowest "effective" dose level, as discussed.  We recommended continuing to hold Eliquis due to ongoing CNS hemorrhage risk, good control of atrial fibrillation, and transitioning to daily Aspirin [id number] secondary stroke prevention.   Will otherwise continue Keppra 500mg56m.  We ask that SusanLORILYN LAITINENrn to clinic in 3 months following post-SRS brain  MRI, or sooner as needed.  We spent twenty additional minutes teaching regarding the natural history, biology, and historical experience in the treatment of neurologic complications of cancer.   We appreciate the opportunity to participate in the care of KAYANNA MCKILLOP.   All questions were answered. The patient knows to call the clinic with any problems, questions or concerns. No barriers to learning were detected.  The total time spent in the encounter was 30 minutes and more than 50% was on counseling and review of test results   [name], MD Medical Director of Neuro-Oncology [facility] at [place] 01/04/21 12:25 PM

## 2021-01-05 ENCOUNTER — Other Ambulatory Visit: Payer: Self-pay | Admitting: Radiation Therapy

## 2021-01-18 NOTE — Op Note (Signed)
Name: Angela Carpenter    MRN: [id number]   Date: 12/28/2020    DOB: 1940-11-13   STEREOTACTIC RADIOSURGERY OPERATIVE NOTE  PRE-OPERATIVE DIAGNOSIS:  Metastatic melanoma to brain  POST-OPERATIVE DIAGNOSIS:  Same  PROCEDURE:  Stereotactic Radiosurgery  SURGEON:  Consuella Lose, MD  RADIATION ONCOLOGIST: Dr. Kyung Rudd, MD  TECHNIQUE:  The patient underwent a radiation treatment planning session in the radiation oncology simulation suite under the care of the radiation oncology physician and physicist.  I participated closely in the radiation treatment planning afterwards. The patient underwent planning CT which was fused to 3T high resolution MRI with 1 mm axial slices.  These images were fused on the planning system.  We contoured the gross target volumes and subsequently expanded this to yield the Planning Target Volume. I actively participated in the planning process.  I helped to define and review the target contours and also the contours of the optic pathway, eyes, brainstem and selected nearby organs at risk.  All the dose constraints for critical structures were reviewed and compared to AAPM Task Group 101.  The prescription dose conformity was reviewed.  I approved the plan electronically.    Accordingly, Angela Carpenter  was brought to the TrueBeam stereotactic radiation treatment linac and placed in the custom immobilization mask.  The patient was aligned according to the IR fiducial markers with BrainLab Exactrac, then orthogonal x-rays were used in ExacTrac with the 6DOF robotic table and the shifts were made to align the patient  Angela Carpenter received stereotactic radiosurgery to a prescription dose of 18Gy to the left frontal lesion in a single fraction uneventfully.    The detailed description of the procedure is recorded in the radiation oncology procedure note.  I was present for the duration of the procedure.  DISPOSITION:   Following delivery, the patient was transported to  nursing in stable condition and monitored for possible acute effects to be discharged to home in stable condition with follow-up in one month.  Consuella Lose, MD [facility]

## 2021-01-25 ENCOUNTER — Other Ambulatory Visit: Payer: Self-pay

## 2021-01-25 ENCOUNTER — Inpatient Hospital Stay: Payer: Medicare Other | Attending: Oncology

## 2021-01-25 ENCOUNTER — Inpatient Hospital Stay: Payer: Medicare Other

## 2021-01-25 ENCOUNTER — Inpatient Hospital Stay (HOSPITAL_BASED_OUTPATIENT_CLINIC_OR_DEPARTMENT_OTHER): Payer: Medicare Other | Admitting: Oncology

## 2021-01-25 VITALS — BP 138/74 | HR 84 | Temp 97.8°F | Resp 18 | Wt 151.4 lb

## 2021-01-25 DIAGNOSIS — Z5112 Encounter for antineoplastic immunotherapy: Secondary | ICD-10-CM | POA: Insufficient documentation

## 2021-01-25 DIAGNOSIS — C211 Malignant neoplasm of anal canal: Secondary | ICD-10-CM

## 2021-01-25 DIAGNOSIS — C7931 Secondary malignant neoplasm of brain: Secondary | ICD-10-CM | POA: Insufficient documentation

## 2021-01-25 DIAGNOSIS — Z853 Personal history of malignant neoplasm of breast: Secondary | ICD-10-CM | POA: Insufficient documentation

## 2021-01-25 DIAGNOSIS — Z79899 Other long term (current) drug therapy: Secondary | ICD-10-CM | POA: Insufficient documentation

## 2021-01-25 DIAGNOSIS — Z800 Family history of malignant neoplasm of digestive organs: Secondary | ICD-10-CM | POA: Diagnosis not present

## 2021-01-25 DIAGNOSIS — Z801 Family history of malignant neoplasm of trachea, bronchus and lung: Secondary | ICD-10-CM | POA: Diagnosis not present

## 2021-01-25 DIAGNOSIS — Z803 Family history of malignant neoplasm of breast: Secondary | ICD-10-CM | POA: Diagnosis not present

## 2021-01-25 DIAGNOSIS — Z7952 Long term (current) use of systemic steroids: Secondary | ICD-10-CM | POA: Insufficient documentation

## 2021-01-25 DIAGNOSIS — E039 Hypothyroidism, unspecified: Secondary | ICD-10-CM

## 2021-01-25 LAB — CMP (CANCER CENTER ONLY)
ALT: 22 U/L (ref 0–44)
AST: 20 U/L (ref 15–41)
Albumin: 3.8 g/dL (ref 3.5–5.0)
Alkaline Phosphatase: 71 U/L (ref 38–126)
Anion gap: 9 (ref 5–15)
BUN: 17 mg/dL (ref 8–23)
CO2: 28 mmol/L (ref 22–32)
Calcium: 9 mg/dL (ref 8.9–10.3)
Chloride: 103 mmol/L (ref 98–111)
Creatinine: 0.7 mg/dL (ref 0.44–1.00)
GFR, Estimated: >60 mL/min (ref >60)
Glucose, Bld: 100 mg/dL — ABNORMAL HIGH (ref 70–99)
Potassium: 4.3 mmol/L (ref 3.5–5.1)
Sodium: 140 mmol/L (ref 135–145)
Total Bilirubin: 0.4 mg/dL (ref 0.3–1.2)
Total Protein: 6.6 g/dL (ref 6.5–8.1)

## 2021-01-25 LAB — CBC WITH DIFFERENTIAL (CANCER CENTER ONLY)
Abs Immature Granulocytes: 0.06 10*3/uL (ref 0.00–0.07)
Basophils Absolute: 0.1 10*3/uL (ref 0.0–0.1)
Basophils Relative: 1 %
Eosinophils Absolute: 0.6 10*3/uL — ABNORMAL HIGH (ref 0.0–0.5)
Eosinophils Relative: 7 %
HCT: 41 % (ref 36.0–46.0)
Hemoglobin: 13.2 g/dL (ref 12.0–15.0)
Immature Granulocytes: 1 %
Lymphocytes Relative: 18 %
Lymphs Abs: 1.6 10*3/uL (ref 0.7–4.0)
MCH: 31.2 pg (ref 26.0–34.0)
MCHC: 32.2 g/dL (ref 30.0–36.0)
MCV: 96.9 fL (ref 80.0–100.0)
Monocytes Absolute: 0.6 10*3/uL (ref 0.1–1.0)
Monocytes Relative: 7 %
Neutro Abs: 6.1 10*3/uL (ref 1.7–7.7)
Neutrophils Relative %: 66 %
Platelet Count: 259 10*3/uL (ref 150–400)
RBC: 4.23 MIL/uL (ref 3.87–5.11)
RDW: 14.2 % (ref 11.5–15.5)
WBC Count: 9.2 10*3/uL (ref 4.0–10.5)
nRBC: 0 % (ref 0.0–0.2)

## 2021-01-25 LAB — TSH: TSH: 1.372 u[IU]/mL (ref 0.308–3.960)

## 2021-01-25 MED ORDER — SODIUM CHLORIDE 0.9 % IV SOLN
Freq: Once | INTRAVENOUS | Status: AC
  Filled 2021-01-25: qty 250

## 2021-01-25 MED ORDER — SODIUM CHLORIDE 0.9 % IV SOLN
200.0000 mg | Freq: Once | INTRAVENOUS | Status: AC
  Administered 2021-01-25: 200 mg via INTRAVENOUS
  Filled 2021-01-25: qty 8

## 2021-01-25 NOTE — Patient Instructions (Signed)
[facility] Discharge Instructions for Patients Receiving Chemotherapy  Today you received the following chemotherapy agents:  Keytruda.  To help prevent nausea and vomiting after your treatment, we encourage you to take your nausea medication as directed.   If you develop nausea and vomiting that is not controlled by your nausea medication, call the clinic.   BELOW ARE SYMPTOMS THAT SHOULD BE REPORTED IMMEDIATELY:  *FEVER GREATER THAN 100.5 F  *CHILLS WITH OR WITHOUT FEVER  NAUSEA AND VOMITING THAT IS NOT CONTROLLED WITH YOUR NAUSEA MEDICATION  *UNUSUAL SHORTNESS OF BREATH  *UNUSUAL BRUISING OR BLEEDING  TENDERNESS IN MOUTH AND THROAT WITH OR WITHOUT PRESENCE OF ULCERS  *URINARY PROBLEMS  *BOWEL PROBLEMS  UNUSUAL RASH Items with * indicate a potential emergency and should be followed up as soon as possible.  Feel free to call the clinic should you have any questions or concerns. The clinic phone number is [phone].  Please show the CHEMO ALERT CARD at check-in to the Emergency Department and triage nurse.    

## 2021-01-25 NOTE — Progress Notes (Signed)
Hematology and Oncology Follow Up Visit  Angela Carpenter [id number] Aug 17, 1941 80 y.o. 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  She developed stage IV with pelvic nodules on developed CNS disease in March 2022.   Prior Therapy:   She is status post perianal excision in August 2020.  Stereotactic radiosurgery completed on December 28, 2020.  He received 18 Gray in 1 fraction.  Pembrolizumab 200 mg every 3 weeks started on July 29, 2019.  Last treatment given on November 08, 2020.  Current therapy: Pembrolizumab 200 mg every 2 weeks and restarted on January 04, 2021.  Interim History: Angela Carpenter returns today for repeat evaluation.  Since the last visit, he reports feeling well at this time and had she has been tapered off dexamethasone completely.  She did have an episode of weakness at the end of March required to increase her dexamethasone dose temporarily.  She is currently on Keppra and off Eliquis completely.  She has resumed most activities of daily living at this time.  She is active continues to ambulate without any difficulty.  She denies any seizure or weakness.     Medications: Updated on review. Current Outpatient Medications  Medication Sig Dispense Refill  . acyclovir (ZOVIRAX) 400 MG tablet Take 400 mg by mouth 2 (two) times daily.    Marland Kitchen alendronate (FOSAMAX) 70 MG tablet Take 70 mg by mouth once a week. Take with a full glass of water on an empty stomach.    Marland Kitchen aspirin EC 81 MG tablet Take 81 mg by mouth daily. Swallow whole.    Marland Kitchen atorvastatin (LIPITOR) 10 MG tablet Take 1 tablet (10 mg total) by mouth daily. (Patient taking differently: Take 10 mg by mouth every evening.) 90 tablet 3  . Cholecalciferol (VITAMIN D-3) 125 MCG (5000 UT) TABS Take 5,000 Units by mouth daily.     . metoprolol succinate (TOPROL-XL) 25 MG 24 hr tablet Take 1 tablet (25 mg  total) by mouth 2 (two) times daily. 180 tablet 3  . Multiple Vitamins-Minerals (PRESERVISION AREDS 2+MULTI VIT) CAPS Take 1 capsule by mouth 2 (two) times daily.    . raloxifene (EVISTA) 60 MG tablet Take 60 mg by mouth in the morning.    . timolol (TIMOPTIC) 0.5 % ophthalmic solution Place 1 drop into both eyes 2 (two) times daily.    Marland Kitchen apixaban (ELIQUIS) 5 MG TABS tablet  (Patient not taking: No sig reported)    . dexamethasone (DECADRON) 1 MG tablet Take 1 tablet (1 mg total) by mouth daily. (Patient not taking: Reported on 01/25/2021) 60 tablet 1  . levETIRAcetam (KEPPRA) 500 MG tablet Take 1 tablet (500 mg total) by mouth 2 (two) times daily. 60 tablet 0   Current Facility-Administered Medications  Medication Dose Route Frequency Provider Last Rate Last Admin  . 0.9 %  sodium chloride infusion  500 mL Intravenous Continuous Danis, Estill Cotta III, MD         Allergies: No Known Allergies     Physical Exam:  Blood pressure 138/74, pulse 84, temperature 97.8 F (36.6 C), temperature source Tympanic, resp. rate 18, weight 151 lb 6.4 oz (68.7 kg), SpO2 100 %.     ECOG: 1   General appearance: Comfortable appearing without any discomfort Head: Normocephalic without any trauma Oropharynx: Mucous membranes are moist and pink without any thrush or ulcers. Eyes: Pupils are equal  and round reactive to light. Lymph nodes: No cervical, supraclavicular, inguinal or axillary lymphadenopathy.   Heart:regular rate and rhythm.  S1 and S2 without leg edema. Lung: Clear without any rhonchi or wheezes.  No dullness to percussion. Abdomin: Soft, nontender, nondistended with good bowel sounds.  No hepatosplenomegaly. Musculoskeletal: No joint deformity or effusion.  Full range of motion noted. Neurological: No deficits noted on motor, sensory and deep tendon reflex exam. Skin: No petechial rash or dryness.  Appeared moist.                             Lab Results: Lab  Results  Component Value Date   WBC 9.2 01/25/2021   HGB 13.2 01/25/2021   HCT 41.0 01/25/2021   MCV 96.9 01/25/2021   PLT 259 01/25/2021     Chemistry      Component Value Date/Time   NA 136 01/04/2021 1313   NA 138 12/05/2016 0750   K 4.3 01/04/2021 1313   CL 99 01/04/2021 1313   CO2 25 01/04/2021 1313   BUN 15 01/04/2021 1313   BUN 15 12/05/2016 0750   CREATININE 0.74 01/04/2021 1313   CREATININE 0.70 09/05/2015 0828      Component Value Date/Time   CALCIUM 9.0 01/04/2021 1313   ALKPHOS 77 01/04/2021 1313   AST 18 01/04/2021 1313   ALT 25 01/04/2021 1313   BILITOT 0.4 01/04/2021 1313         Impression and Plan:  80 year old with:  1.   Stage IV melanoma diagnosed in August 2020 and subsequently developed CNS disease in March 2022.    She has resumed Pembrolizumab at this time without any complications.  Risks and benefits of continuing this treatment were discussed at this time.  Her last PET scan was obtained in January 2022 and will be updated in July 2022.  She is agreeable to continue at this time.  Combination immunotherapy could be also used as a salvage option.  We discussed the option of switching to every 6-week schedule of Pembrolizumab for convenience and she is agreeable.  2.  Immune mediated complications: I continue to educate her about potential complications including pneumonitis, colitis and thyroid disease.   3. Dermatology surveillance: I continue to advise her to follow-up with dermatology regarding surveillance.  4.  Antiemetics: No nausea or vomiting reported at this time.  5.  CNS metastasis: She will have a repeat MRI in 2 months.  She is status post radiation therapy.  6.  Breast cancer: No evidence of relapse noted at this time.  7.  Follow-up: In 3 weeks for repeat evaluation.  30 minutes were dedicated to this visit.  The time was spent on updating disease status, complications and therapy and future plan of care  review.   Zola Button, MD 4/21/202212:03 PM

## 2021-01-29 ENCOUNTER — Ambulatory Visit
Admission: RE | Admit: 2021-01-29 | Discharge: 2021-01-29 | Disposition: A | Discharge status: Home / Self Care | Payer: Medicare Other | Source: Ambulatory Visit | Attending: Radiation Oncology | Admitting: Radiation Oncology

## 2021-01-29 ENCOUNTER — Other Ambulatory Visit: Payer: Self-pay

## 2021-01-29 DIAGNOSIS — C7931 Secondary malignant neoplasm of brain: Secondary | ICD-10-CM

## 2021-02-02 ENCOUNTER — Other Ambulatory Visit: Payer: Self-pay | Admitting: Internal Medicine

## 2021-02-02 DIAGNOSIS — Z1231 Encounter for screening mammogram for malignant neoplasm of breast: Secondary | ICD-10-CM

## 2021-02-06 NOTE — Progress Notes (Addendum)
  Radiation Oncology         [phone] ________________________________  Name: Angela Carpenter MRN: [id number]  Date of Service: 02/06/21  DOB: 1941-09-13  Post Treatment Telephone Note  Diagnosis:  Stage IV melanoma of the anal canal with brain metastasis  Interval Since Last Radiation:  6 weeks   12/28/20 PTV 1 - Lt Parietal 22mm target was treated with 18 Gy in a single fraction.  Rapid Arc - 4 fields  Narrative:  The patient was contacted today for routine follow-up. After the [facility] treatment she did have weakness in her Rt arm and was given a short course of steroids to help with the inflammation. She has since fully recovered and is back to mowing the lawn and feeling herself again.    Impression/Plan: 1.Overall Angela Carpenter has been doing well since completion of stereotactic radiosurgery [facility]). We discussed that we would recommend repeat imaging with an MRI three months after her treatment. This scan has already been ordered by Dr. Mickeal Skinner and a follow-up scheduled for June.  The patient's imaging will be reviewed in multidisciplinary brain and spine oncology conference.  She will also continue to follow up with Dr. Alen Blew in medical oncology for long term care.     Angela Carpenter, R.T.(R)(T)

## 2021-02-06 NOTE — Addendum Note (Signed)
Encounter addended by: Pincus Large on: 02/06/2021 10:57 AM  Actions taken: Clinical Note Signed

## 2021-02-12 DIAGNOSIS — H401131 Primary open-angle glaucoma, bilateral, mild stage: Secondary | ICD-10-CM | POA: Diagnosis not present

## 2021-02-15 ENCOUNTER — Other Ambulatory Visit: Payer: Self-pay

## 2021-02-15 ENCOUNTER — Inpatient Hospital Stay: Payer: Medicare Other | Attending: Oncology

## 2021-02-15 ENCOUNTER — Inpatient Hospital Stay: Payer: Medicare Other

## 2021-02-15 ENCOUNTER — Inpatient Hospital Stay (HOSPITAL_BASED_OUTPATIENT_CLINIC_OR_DEPARTMENT_OTHER): Payer: Medicare Other | Admitting: Oncology

## 2021-02-15 VITALS — BP 126/58 | HR 74 | Temp 97.6°F | Resp 17 | Ht 65.0 in | Wt 150.1 lb

## 2021-02-15 DIAGNOSIS — Z800 Family history of malignant neoplasm of digestive organs: Secondary | ICD-10-CM | POA: Insufficient documentation

## 2021-02-15 DIAGNOSIS — C7931 Secondary malignant neoplasm of brain: Secondary | ICD-10-CM | POA: Insufficient documentation

## 2021-02-15 DIAGNOSIS — Z923 Personal history of irradiation: Secondary | ICD-10-CM | POA: Diagnosis not present

## 2021-02-15 DIAGNOSIS — Z801 Family history of malignant neoplasm of trachea, bronchus and lung: Secondary | ICD-10-CM | POA: Diagnosis not present

## 2021-02-15 DIAGNOSIS — Z803 Family history of malignant neoplasm of breast: Secondary | ICD-10-CM | POA: Diagnosis not present

## 2021-02-15 DIAGNOSIS — Z853 Personal history of malignant neoplasm of breast: Secondary | ICD-10-CM | POA: Insufficient documentation

## 2021-02-15 DIAGNOSIS — C211 Malignant neoplasm of anal canal: Secondary | ICD-10-CM | POA: Insufficient documentation

## 2021-02-15 DIAGNOSIS — Z79899 Other long term (current) drug therapy: Secondary | ICD-10-CM | POA: Insufficient documentation

## 2021-02-15 DIAGNOSIS — Z7952 Long term (current) use of systemic steroids: Secondary | ICD-10-CM | POA: Insufficient documentation

## 2021-02-15 DIAGNOSIS — E039 Hypothyroidism, unspecified: Secondary | ICD-10-CM

## 2021-02-15 DIAGNOSIS — Z5112 Encounter for antineoplastic immunotherapy: Secondary | ICD-10-CM | POA: Diagnosis not present

## 2021-02-15 LAB — CMP (CANCER CENTER ONLY)
ALT: 15 U/L (ref 0–44)
AST: 20 U/L (ref 15–41)
Albumin: 3.8 g/dL (ref 3.5–5.0)
Alkaline Phosphatase: 72 U/L (ref 38–126)
Anion gap: 10 (ref 5–15)
BUN: 14 mg/dL (ref 8–23)
CO2: 28 mmol/L (ref 22–32)
Calcium: 9.3 mg/dL (ref 8.9–10.3)
Chloride: 102 mmol/L (ref 98–111)
Creatinine: 0.74 mg/dL (ref 0.44–1.00)
GFR, Estimated: >60 mL/min (ref >60)
Glucose, Bld: 89 mg/dL (ref 70–99)
Potassium: 4.6 mmol/L (ref 3.5–5.1)
Sodium: 140 mmol/L (ref 135–145)
Total Bilirubin: 0.5 mg/dL (ref 0.3–1.2)
Total Protein: 6.6 g/dL (ref 6.5–8.1)

## 2021-02-15 LAB — CBC WITH DIFFERENTIAL (CANCER CENTER ONLY)
Abs Immature Granulocytes: 0.08 10*3/uL — ABNORMAL HIGH (ref 0.00–0.07)
Basophils Absolute: 0.2 10*3/uL — ABNORMAL HIGH (ref 0.0–0.1)
Basophils Relative: 1 %
Eosinophils Absolute: 0.6 10*3/uL — ABNORMAL HIGH (ref 0.0–0.5)
Eosinophils Relative: 6 %
HCT: 41.4 % (ref 36.0–46.0)
Hemoglobin: 13.4 g/dL (ref 12.0–15.0)
Immature Granulocytes: 1 %
Lymphocytes Relative: 20 %
Lymphs Abs: 2.1 10*3/uL (ref 0.7–4.0)
MCH: 31.1 pg (ref 26.0–34.0)
MCHC: 32.4 g/dL (ref 30.0–36.0)
MCV: 96.1 fL (ref 80.0–100.0)
Monocytes Absolute: 1 10*3/uL (ref 0.1–1.0)
Monocytes Relative: 10 %
Neutro Abs: 6.5 10*3/uL (ref 1.7–7.7)
Neutrophils Relative %: 62 %
Platelet Count: 247 10*3/uL (ref 150–400)
RBC: 4.31 MIL/uL (ref 3.87–5.11)
RDW: 13.7 % (ref 11.5–15.5)
WBC Count: 10.4 10*3/uL (ref 4.0–10.5)
nRBC: 0 % (ref 0.0–0.2)

## 2021-02-15 LAB — TSH: TSH: 2.254 u[IU]/mL (ref 0.308–3.960)

## 2021-02-15 MED ORDER — SODIUM CHLORIDE 0.9 % IV SOLN
Freq: Once | INTRAVENOUS | Status: AC
Start: 2021-02-15 — End: 2021-02-15
  Filled 2021-02-15: qty 250

## 2021-02-15 MED ORDER — SODIUM CHLORIDE 0.9 % IV SOLN
400.0000 mg | Freq: Once | INTRAVENOUS | Status: AC
  Administered 2021-02-15: 400 mg via INTRAVENOUS
  Filled 2021-02-15: qty 16

## 2021-02-15 NOTE — Progress Notes (Signed)
Hematology and Oncology Follow Up Visit  NAVPREET SZCZYGIEL [id number] Aug 06, 1941 80 y.o. 02/15/2021 11:46 AM Tisovec, [name], MDTisovec, [name], MD   Principle Diagnosis: 80 year old woman with stage IV mucosal melanoma of the anal canal diagnosed in 2020.  She presented with pelvic nodules on developed CNS disease in March 2022.   Prior Therapy:   She is status post perianal excision in August 2020.  Stereotactic radiosurgery completed on December 28, 2020.  He received 18 Gray in 1 fraction.  Pembrolizumab 200 mg every 3 weeks started on July 29, 2019.  Last treatment given on November 08, 2020.  Current therapy: Pembrolizumab 200 mg every 3 weeks and restarted on January 04, 2021.  She would receive 400 mg every 6 weeks starting with today's treatment.  Interim History: Ms. Soltau  is here for a follow-up visit.  Since the last visit, she reports no major changes in health.  She continues to recover reasonably well from her radiation and CNS metastasis.  She has resumed most activities of daily living although not driving.  She denies any fevers, chills, headaches.  She denies any confusion or weakness.  She does report pruritus associated with Pembrolizumab but no other complaints.     Medications: Unchanged on review. Current Outpatient Medications  Medication Sig Dispense Refill  . acyclovir (ZOVIRAX) 400 MG tablet Take 400 mg by mouth 2 (two) times daily.    Marland Kitchen alendronate (FOSAMAX) 70 MG tablet Take 70 mg by mouth once a week. Take with a full glass of water on an empty stomach.    Marland Kitchen apixaban (ELIQUIS) 5 MG TABS tablet  (Patient not taking: No sig reported)    . aspirin EC 81 MG tablet Take 81 mg by mouth daily. Swallow whole.    Marland Kitchen atorvastatin (LIPITOR) 10 MG tablet Take 1 tablet (10 mg total) by mouth daily. (Patient taking differently: Take 10 mg by mouth every evening.) 90 tablet 3  . Cholecalciferol (VITAMIN D-3) 125 MCG (5000 UT) TABS Take 5,000 Units by mouth daily.     Marland Kitchen  dexamethasone (DECADRON) 1 MG tablet Take 1 tablet (1 mg total) by mouth daily. (Patient not taking: Reported on 01/25/2021) 60 tablet 1  . levETIRAcetam (KEPPRA) 500 MG tablet Take 1 tablet (500 mg total) by mouth 2 (two) times daily. 60 tablet 0  . metoprolol succinate (TOPROL-XL) 25 MG 24 hr tablet Take 1 tablet (25 mg total) by mouth 2 (two) times daily. 180 tablet 3  . Multiple Vitamins-Minerals (PRESERVISION AREDS 2+MULTI VIT) CAPS Take 1 capsule by mouth 2 (two) times daily.    . raloxifene (EVISTA) 60 MG tablet Take 60 mg by mouth in the morning.    . timolol (TIMOPTIC) 0.5 % ophthalmic solution Place 1 drop into both eyes 2 (two) times daily.     Current Facility-Administered Medications  Medication Dose Route Frequency Provider Last Rate Last Admin  . 0.9 %  sodium chloride infusion  500 mL Intravenous Continuous Danis, Estill Cotta III, MD         Allergies: No Known Allergies     Physical Exam:   Blood pressure (!) 126/58, pulse 74, temperature 97.6 F (36.4 C), temperature source Tympanic, resp. rate 17, height 5\' 5"  (1.651 m), weight 150 lb 1.6 oz (68.1 kg), SpO2 100 %.    ECOG: 1    General appearance: Alert, awake without any distress. Head: Atraumatic without abnormalities Oropharynx: Without any thrush or ulcers. Eyes: No scleral icterus. Lymph nodes: No  lymphadenopathy noted in the cervical, supraclavicular, or axillary nodes Heart:regular rate and rhythm, without any murmurs or gallops.   Lung: Clear to auscultation without any rhonchi, wheezes or dullness to percussion. Abdomin: Soft, nontender without any shifting dullness or ascites. Musculoskeletal: No clubbing or cyanosis. Neurological: No motor or sensory deficits. Skin: No rashes or lesions.                             Lab Results: Lab Results  Component Value Date   WBC 10.4 02/15/2021   HGB 13.4 02/15/2021   HCT 41.4 02/15/2021   MCV 96.1 02/15/2021   PLT 247  02/15/2021     Chemistry      Component Value Date/Time   NA 140 01/25/2021 1130   NA 138 12/05/2016 0750   K 4.3 01/25/2021 1130   CL 103 01/25/2021 1130   CO2 28 01/25/2021 1130   BUN 17 01/25/2021 1130   BUN 15 12/05/2016 0750   CREATININE 0.70 01/25/2021 1130   CREATININE 0.70 09/05/2015 0828      Component Value Date/Time   CALCIUM 9.0 01/25/2021 1130   ALKPHOS 71 01/25/2021 1130   AST 20 01/25/2021 1130   ALT 22 01/25/2021 1130   BILITOT 0.4 01/25/2021 1130         Impression and Plan:  80 year old with:  1.   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  She developed stage IV disease with CNS involvement subsequently.  Her disease status was updated at this time and risks and benefits of continuing this treatment were discussed.  Complications including autoimmune considerations, GI toxicity and dermatological toxicity were reviewed.  She is agreeable to proceed and the plan to update her staging scans in July 2022.  2.  Immune mediated complications: Complications such as pneumonitis, colitis and thyroid disease were reiterated.  She is not experiencing any at this time.   3. Dermatology surveillance: She is up-to-date on dermatology surveillance.  4.  Antiemetics: Compazine is available to her without any nausea or vomiting.  5.  CNS metastasis: She completed radiation therapy and currently on active surveillance and will have repeat MRI in the future.  6.  Breast cancer: She is up-to-date on her mammography which will be repeated in the near future.  7.  Follow-up: In 6 weeks for repeat evaluation.  30 minutes were dedicated to this visit.  The time was spent on updating disease status, complications and therapy and future plan of care review.   Zola Button, MD 5/12/202211:46 AM

## 2021-02-15 NOTE — Patient Instructions (Signed)
[facility] MEDICAL [facility]COLOGY  Discharge Instructions: Thank you for choosing [facility] to provide your oncology and hematology care.   If you have a lab appointment with the Cancer Center, please go directly to the Cancer Center and check in at the registration area.   Wear comfortable clothing and clothing appropriate for easy access to any Portacath or PICC line.   We strive to give you quality time with your provider. You may need to reschedule your appointment if you arrive late (15 or more minutes).  Arriving late affects you and other patients whose appointments are after yours.  Also, if you miss three or more appointments without notifying the office, you may be dismissed from the clinic at the provider's discretion.      For prescription refill requests, have your pharmacy contact our office and allow 72 hours for refills to be completed.    Today you received the following chemotherapy and/or immunotherapy agents: keytruda      To help prevent nausea and vomiting after your treatment, we encourage you to take your nausea medication as directed.  BELOW ARE SYMPTOMS THAT SHOULD BE REPORTED IMMEDIATELY: *FEVER GREATER THAN 100.4 F (38 C) OR HIGHER *CHILLS OR SWEATING *NAUSEA AND VOMITING THAT IS NOT CONTROLLED WITH YOUR NAUSEA MEDICATION *UNUSUAL SHORTNESS OF BREATH *UNUSUAL BRUISING OR BLEEDING *URINARY PROBLEMS (pain or burning when urinating, or frequent urination) *BOWEL PROBLEMS (unusual diarrhea, constipation, pain near the anus) TENDERNESS IN MOUTH AND THROAT WITH OR WITHOUT PRESENCE OF ULCERS (sore throat, sores in mouth, or a toothache) UNUSUAL RASH, SWELLING OR PAIN  UNUSUAL VAGINAL DISCHARGE OR ITCHING   Items with * indicate a potential emergency and should be followed up as soon as possible or go to the Emergency Department if any problems should occur.  Please show the CHEMOTHERAPY ALERT CARD or IMMUNOTHERAPY ALERT CARD at check-in to  the Emergency Department and triage nurse.  Should you have questions after your visit or need to cancel or reschedule your appointment, please contact [facility]  Dept: [phone]  and follow the prompts.  Office hours are 8:00 a.m. to 4:30 p.m. Monday - Friday. Please note that voicemails left after 4:00 p.m. may not be returned until the following business day.  We are closed weekends and major holidays. You have access to a nurse at all times for urgent questions. Please call the main number to the clinic Dept: [phone] and follow the prompts.   For any non-urgent questions, you may also contact your provider using MyChart. We now offer e-Visits for anyone 18 and older to request care online for non-urgent symptoms. For details visit [email or web address].   Also download the MyChart app! Go to the app store, search "MyChart", open the app, select , and log in with your MyChart username and password.  Due to Covid, a mask is required upon entering the hospital/clinic. If you do not have a mask, one will be given to you upon arrival. For doctor visits, patients may have 1 support person aged 18 or older with them. For treatment visits, patients cannot have anyone with them due to current Covid guidelines and our immunocompromised population.   

## 2021-03-07 ENCOUNTER — Telehealth: Payer: Self-pay | Admitting: Radiation Therapy

## 2021-03-07 NOTE — Telephone Encounter (Addendum)
Received a call from Ms. Angela Carpenter about a recent change. On Friday 5/27 she noticed a heaviness in her Rt. Arm with difficulty to lift it, similar to her initial presentation at diagnosis. She also has been " a little shaky when walking", more tired than usual in the mornings and having to take a nap, which is not typical for her.   Ms. Angela Carpenter is questioning if she should resume steroids, and also mentioned that prior to the seizure she had been taking Eliquis. She is questioning if she should resume this medication and concerned about the possible risk of stroke without it.   I will share this information to Dr. Mickeal Skinner and her Rad Onc team for follow-up and recommendations.   Mont Dutton R.T.(R)(T) Radiation Special Procedures Navigator     UPDATE: Recommendations from Angela Simpson PA-C given to Ms. Angela Carpenter at 1:38pm.  Front load with 8 mg dexamethasone (2 tabs) now, then 4mg  (one tab) this evening, then just 4 mg TID until Dr. Mickeal Skinner can weigh in, and if symptoms progress go to ED to rule out stroke   Ms. Angela Carpenter only has six 4 mg tablets remaining, she will need updated instructions/prescription 6/2.   Mont Dutton R.T.(R)(T)

## 2021-03-08 ENCOUNTER — Other Ambulatory Visit: Payer: Self-pay

## 2021-03-08 ENCOUNTER — Other Ambulatory Visit: Payer: Self-pay | Admitting: Internal Medicine

## 2021-03-08 DIAGNOSIS — I119 Hypertensive heart disease without heart failure: Secondary | ICD-10-CM

## 2021-03-08 DIAGNOSIS — Z9229 Personal history of other drug therapy: Secondary | ICD-10-CM

## 2021-03-08 DIAGNOSIS — E7849 Other hyperlipidemia: Secondary | ICD-10-CM

## 2021-03-08 DIAGNOSIS — I480 Paroxysmal atrial fibrillation: Secondary | ICD-10-CM

## 2021-03-08 MED ORDER — ATORVASTATIN CALCIUM 10 MG PO TABS: 10.0000 mg | ORAL_TABLET | Freq: Every day | ORAL | 0 refills | Status: DC

## 2021-03-08 MED ORDER — DEXAMETHASONE 4 MG PO TABS: 4.0000 mg | ORAL_TABLET | Freq: Every day | ORAL | 0 refills | Status: DC

## 2021-03-08 MED ORDER — METOPROLOL SUCCINATE ER 25 MG PO TB24: 25.0000 mg | ORAL_TABLET | Freq: Two times a day (BID) | ORAL | 0 refills | Status: DC

## 2021-03-08 NOTE — Progress Notes (Signed)
Patient describes return to baseline after dosing decadron 12mg  yesterday.   We recommended continuing 4mg  daily until MRI scan scheduled and follow up scheduled for later this month.    She will call if symptoms recur or evolve further.  Angela Sellers, MD

## 2021-03-19 ENCOUNTER — Other Ambulatory Visit: Payer: Self-pay

## 2021-03-19 ENCOUNTER — Inpatient Hospital Stay: Payer: Medicare Other | Attending: Oncology | Admitting: Internal Medicine

## 2021-03-19 VITALS — BP 143/74 | HR 75 | Temp 97.0°F | Resp 18 | Wt 147.8 lb

## 2021-03-19 DIAGNOSIS — Z923 Personal history of irradiation: Secondary | ICD-10-CM | POA: Diagnosis not present

## 2021-03-19 DIAGNOSIS — Z800 Family history of malignant neoplasm of digestive organs: Secondary | ICD-10-CM | POA: Insufficient documentation

## 2021-03-19 DIAGNOSIS — Z853 Personal history of malignant neoplasm of breast: Secondary | ICD-10-CM | POA: Diagnosis not present

## 2021-03-19 DIAGNOSIS — C7931 Secondary malignant neoplasm of brain: Secondary | ICD-10-CM | POA: Diagnosis not present

## 2021-03-19 DIAGNOSIS — Z803 Family history of malignant neoplasm of breast: Secondary | ICD-10-CM | POA: Diagnosis not present

## 2021-03-19 DIAGNOSIS — Z801 Family history of malignant neoplasm of trachea, bronchus and lung: Secondary | ICD-10-CM | POA: Diagnosis not present

## 2021-03-19 DIAGNOSIS — C211 Malignant neoplasm of anal canal: Secondary | ICD-10-CM | POA: Diagnosis not present

## 2021-03-19 DIAGNOSIS — Z79899 Other long term (current) drug therapy: Secondary | ICD-10-CM | POA: Diagnosis not present

## 2021-03-19 DIAGNOSIS — Z5112 Encounter for antineoplastic immunotherapy: Secondary | ICD-10-CM | POA: Insufficient documentation

## 2021-03-19 DIAGNOSIS — Z7952 Long term (current) use of systemic steroids: Secondary | ICD-10-CM | POA: Insufficient documentation

## 2021-03-19 NOTE — Progress Notes (Signed)
[facility] at [place], [place] [address] [phone]   Interval Evaluation  Date of Service: 03/19/21 Patient Name: Angela Carpenter Patient MRN: [id number] Patient DOB: 1941/08/18 Provider: [name], MD  Identifying Statement:  Angela Carpenter is a 80 y.o. female with Brain metastasis ([place]) [C79.31] w  Primary Cancer:  Oncologic History: Oncology History  Malignant melanoma of anal canal ([place])  07/21/2019 Initial Diagnosis   Malignant melanoma of anal canal ([place])    07/29/2019 -  Chemotherapy      Patient is on Antibody Plan: HEAD/NECK PEMBROLIZUMAB Q21D      CNS Oncologic History 12/28/20: SRS to L frontal metastasis  Interval History:  Angela Carpenter presents today for follow up given recent clinical complaints.   She describes increase in fatigue overall in the past two weeks, although has felt somewhat better since the weekend.  Doesn't feel her left sided weakness is any worse over that time. No seizure described in this time period.  Otherwise no other complaints or progressive symptoms.  Decadron 01/04/21: 68m, taper 01/24/21: Dc'd 03/07/21: 163mx1, then 48m39maily  H+P (12/15/20) Patient presented to medical attention last week with new onset seizure, described as "right arm shaking, followed by loss of consciousness and whole body shaking".  CNS imaging demonstrated an enhancing and hemorrhagic left frontal mass, c/w melanoma met.  She was started on Keppra and discharged to home after return to baseline.  At this time, she is concerned about her Eliquis, which was stopped while in the hospital.  She has no other complaints today, no recurrence of seizures since discharge.  Planning radiosurgery for the metastasis.  Medications: Current Outpatient Medications on File Prior to Visit  Medication Sig Dispense Refill   acyclovir (ZOVIRAX) 400 MG tablet Take 400 mg by mouth 2 (two) times daily.     alendronate  (FOSAMAX) 70 MG tablet Take 70 mg by mouth once a week. Take with a full glass of water on an empty stomach.     apixaban (ELIQUIS) 5 MG TABS tablet  (Patient not taking: No sig reported)     aspirin EC 81 MG tablet Take 81 mg by mouth daily. Swallow whole.     atorvastatin (LIPITOR) 10 MG tablet Take 1 tablet (10 mg total) by mouth daily. 90 tablet 0   Cholecalciferol (VITAMIN D-3) 125 MCG (5000 UT) TABS Take 5,000 Units by mouth daily.      dexamethasone (DECADRON) 4 MG tablet Take 1 tablet (4 mg total) by mouth daily. 30 tablet 0   levETIRAcetam (KEPPRA) 500 MG tablet Take 1 tablet (500 mg total) by mouth 2 (two) times daily. 60 tablet 0   metoprolol succinate (TOPROL-XL) 25 MG 24 hr tablet Take 1 tablet (25 mg total) by mouth 2 (two) times daily. 180 tablet 0   Multiple Vitamins-Minerals (PRESERVISION AREDS 2+MULTI VIT) CAPS Take 1 capsule by mouth 2 (two) times daily.     raloxifene (EVISTA) 60 MG tablet Take 60 mg by mouth in the morning.     timolol (TIMOPTIC) 0.5 % ophthalmic solution Place 1 drop into both eyes 2 (two) times daily.     Current Facility-Administered Medications on File Prior to Visit  Medication Dose Route Frequency Provider Last Rate Last Admin   0.9 %  sodium chloride infusion  500 mL Intravenous Continuous Danis, HenKirke CorinD        Allergies: No Known Allergies Past Medical  History:  Past Medical History:  Diagnosis Date   A-fib [facility])    Atrial fibrillation ([place])    4 years ago started   Brain cancer ([place]) 12/2020   Breast cancer ([place]) 2006   Colon polyps    Glaucoma    Hypercholesteremia    Hypertension    patient denies    Osteoporosis    Personal history of chemotherapy 2006   Personal history of radiation therapy 2006   Stroke (cerebrum) ([place]) 2015   Past Surgical History:  Past Surgical History:  Procedure Laterality Date   AUGMENTATION MAMMAPLASTY Bilateral 2006   Patient had them removed in 2008   BREAST BIOPSY Left 2016   BREAST  LUMPECTOMY Right 2006   COLONOSCOPY     had polyps   EYE SURGERY Right    cataract   MELANOMA EXCISION WITH SENTINEL LYMPH NODE BIOPSY N/A 06/02/2019   Procedure: Left lateral ANAL MELANOMA EXCISION WITH Bilateral Groin exploration and SENTINEL node mapping;  Surgeon: [name], MD;  Location: [facility];  Service: General;  Laterality: N/A;   TONSILLECTOMY     Social History:  Social History   Socioeconomic History   Marital status: Widowed    Spouse name: Not on file   Number of children: 1   Years of education: Not on file   Highest education level: Not on file  Occupational History   Occupation: retired  Tobacco Use   Smoking status: Passive Smoke Exposure - Never Smoker   Smokeless tobacco: Never   Tobacco comments:    smoked in college but didn't inhale  Vaping Use   Vaping Use: Never used  Substance and Sexual Activity   Alcohol use: No    Alcohol/week: 0.0 standard drinks   Drug use: No   Sexual activity: Not on file  Other Topics Concern   Not on file  Social History Narrative   Not on file   Social Determinants of Health   Financial Resource Strain: Not on file  Food Insecurity: Not on file  Transportation Needs: Not on file  Physical Activity: Not on file  Stress: Not on file  Social Connections: Not on file  Intimate Partner Violence: Not on file   Family History:  Family History  Problem Relation Age of Onset   Lung cancer Mother    Hypotension Mother    Pancreatic cancer Father    Hypertension Sister    Kidney cancer Brother    Cirrhosis Maternal Grandfather    Breast cancer Paternal Grandmother    Cirrhosis Paternal Grandfather    Breast cancer Paternal Aunt     Review of Systems: Constitutional: Doesn't report fevers, chills or abnormal weight loss Eyes: Doesn't report blurriness of vision Ears, nose, mouth, throat, and face: Doesn't report sore throat Respiratory: Doesn't report cough, dyspnea or wheezes Cardiovascular: Doesn't report  palpitation, chest discomfort  Gastrointestinal:  Doesn't report nausea, constipation, diarrhea GU: Doesn't report incontinence Skin: Doesn't report skin rashes Neurological: Per HPI Musculoskeletal: Doesn't report joint pain Behavioral/Psych: Doesn't report anxiety  Physical Exam: Vitals:   03/19/21 0935  BP: (!) 143/74  Pulse: 75  Resp: 18  Temp: (!) 97 F (36.1 C)  SpO2: 99%    KPS: 90. General: Alert, cooperative, pleasant, in no acute distress Head: Normal EENT: No conjunctival injection or scleral icterus.  Lungs: Resp effort normal Cardiac: Regular rate Abdomen: Non-distended abdomen Skin: No rashes cyanosis or petechiae. Extremities: No clubbing or edema  Neurologic Exam: Mental Status: Awake, alert, attentive  to examiner. Oriented to self and environment. Language is fluent with intact comprehension.  Cranial Nerves: Visual acuity is grossly normal. Visual fields are full. Extra-ocular movements intact. No ptosis. Face is symmetric Motor: Tone and bulk are normal. Subtle pronator drift on right, some fine motor impairment R hand. Reflexes are symmetric, no pathologic reflexes present.  Sensory: Intact to light touch Gait: Normal.   Labs: I have reviewed the data as listed    Component Value Date/Time   NA 140 02/15/2021 1132   NA 138 12/05/2016 0750   K 4.6 02/15/2021 1132   CL 102 02/15/2021 1132   CO2 28 02/15/2021 1132   GLUCOSE 89 02/15/2021 1132   BUN 14 02/15/2021 1132   BUN 15 12/05/2016 0750   CREATININE 0.74 02/15/2021 1132   CREATININE 0.70 09/05/2015 0828   CALCIUM 9.3 02/15/2021 1132   PROT 6.6 02/15/2021 1132   PROT 6.6 12/05/2016 0750   ALBUMIN 3.8 02/15/2021 1132   ALBUMIN 4.4 12/05/2016 0750   AST 20 02/15/2021 1132   ALT 15 02/15/2021 1132   ALKPHOS 72 02/15/2021 1132   BILITOT 0.5 02/15/2021 1132   GFRNONAA >60 02/15/2021 1132   GFRAA >60 06/30/2020 0840   Lab Results  Component Value Date   WBC 10.4 02/15/2021   NEUTROABS  6.5 02/15/2021   HGB 13.4 02/15/2021   HCT 41.4 02/15/2021   MCV 96.1 02/15/2021   PLT 247 02/15/2021     Assessment/Plan Brain metastasis ([place]) [C79.31]  [name] presents today with clinical syndrome consistent with left frontal lobe dysfunction, secondary to delayed post-SRS inflammatory reaction.    Dexamethasone has been helpful.  It does not appear as if hemorrhagic conversion is likely given the clinical scenario.  Should continue on dex 57m daily for now, will follow up again next week.  Should continue to hold Eliquis due to ongoing CNS hemorrhage risk, good control of atrial fibrillation.    Will otherwise continue Keppra 5024mBID.  We ask that Angela SESAYeturn to clinic in next week following MRI brain.  We spent twenty additional minutes teaching regarding the natural history, biology, and historical experience in the treatment of neurologic complications of cancer.   We appreciate the opportunity to participate in the care of SuLAURAJEAN Carpenter  All questions were answered. The patient knows to call the clinic with any problems, questions or concerns. No barriers to learning were detected.  The total time spent in the encounter was 30 minutes and more than 50% was on counseling and review of test results   ZaVentura SellersMD Medical Director of Neuro-Oncology [facility]6/13/22 9:38 AM

## 2021-03-23 ENCOUNTER — Ambulatory Visit
Admission: RE | Admit: 2021-03-23 | Discharge: 2021-03-23 | Disposition: A | Discharge status: Home / Self Care | Payer: Medicare Other | Source: Ambulatory Visit | Attending: Internal Medicine | Admitting: Internal Medicine

## 2021-03-23 DIAGNOSIS — C7931 Secondary malignant neoplasm of brain: Secondary | ICD-10-CM | POA: Diagnosis not present

## 2021-03-23 DIAGNOSIS — G936 Cerebral edema: Secondary | ICD-10-CM | POA: Diagnosis not present

## 2021-03-23 IMAGING — MR MR HEAD WO/W CM
12 series · 48 of 48 positions shown · IV contrast (13ML MULTI)
Comparison: [DATE]

CLINICAL DATA: Intracranial metastatic disease status post
stereotactic radio surgery.

EXAM:
MRI HEAD WITHOUT AND WITH CONTRAST
TECHNIQUE: Multiplanar, multiecho pulse sequences of the brain and surrounding
structures were obtained without and with intravenous contrast.
CONTRAST:  15mL MULTIHANCE GADOBENATE DIMEGLUMINE 529 MG/ML IV SOLN

[Series 2: FLAIR · sagittal · 3.0mm · 0.75mm/px · 3 of 39 slices shown (1 of 2)]
[im 1/39]
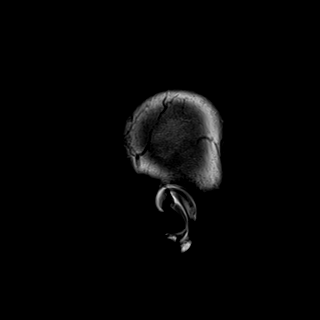
[im 20/39]
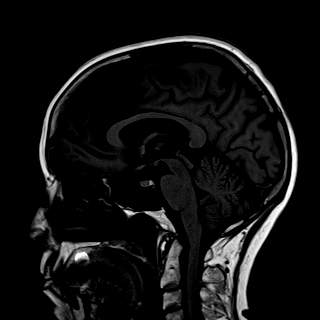
[im 39/39]
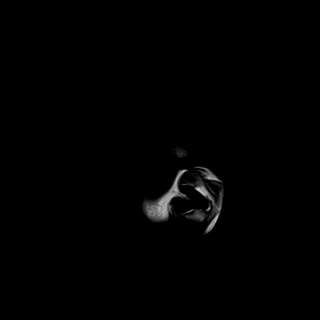

[Series 3: DWI · axial · 3.0mm · 1.50mm/px · z∈[-48,+111]mm · 4 of 84 slices shown (1 of 2)]
[im 1/84]
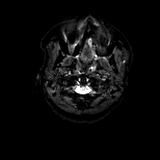
[im 28/84]
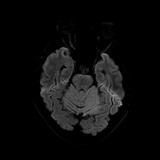
[im 56/84]
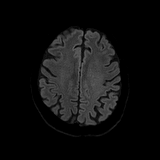
[im 84/84]
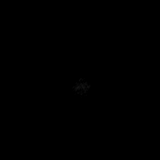

[Series 4: DWI · axial · 3.0mm · 1.50mm/px · z∈[-48,+111]mm · 2 of 42 slices shown (2 of 2)]
[im 1/42]
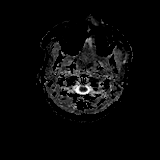
[im 42/42]
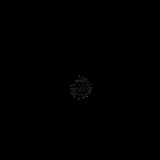

[Series 5: T2 · axial · 5.0mm · 0.57mm/px · 1 of 30 slices shown (1 of 2)]
[im 1/30]
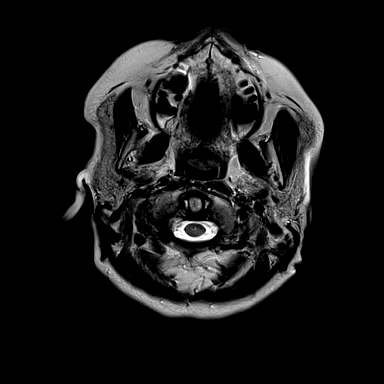

[Series 6: swi_images · axial · 1.5mm · 0.90mm/px · z∈[-47,+119]mm · 5 of 112 slices shown]
[im 1/112]
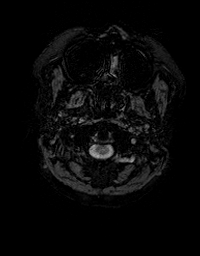
[im 28/112]
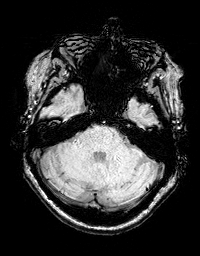
[im 56/112]
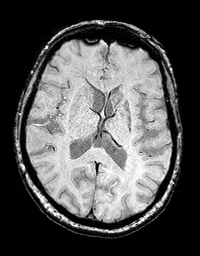
[im 84/112]
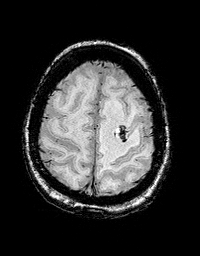
[im 112/112]
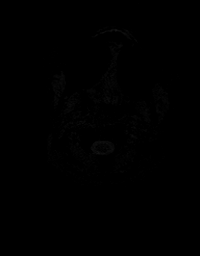

[Series 8: FLAIR · axial · 3.0mm · 0.86mm/px · z∈[-61,+128]mm · 3 of 60 slices shown (2 of 2)]
[im 1/60]
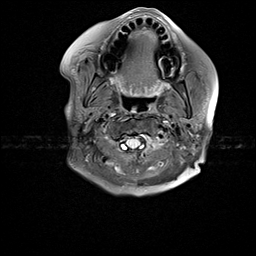
[im 30/60]
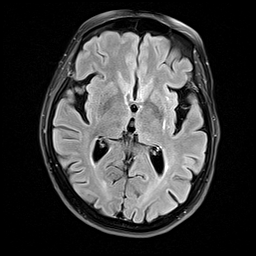
[im 60/60]
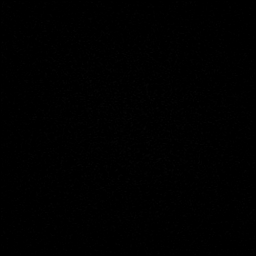

[Series 9: T2 · axial · non-contrast · 1.0mm · 0.86mm/px · z∈[-49,+123]mm · 8 of 176 slices shown (2 of 2)]
[im 1/176]
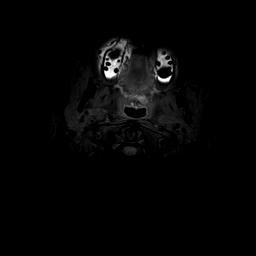
[im 26/176]
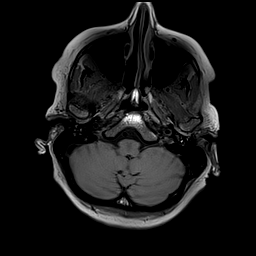
[im 51/176]
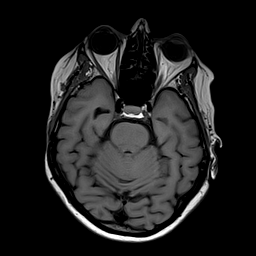
[im 76/176]
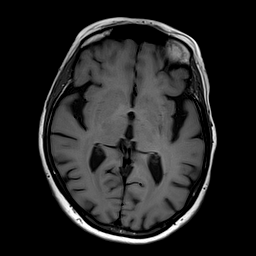
[im 101/176]
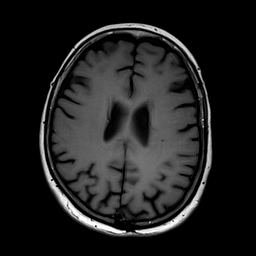
[im 126/176]
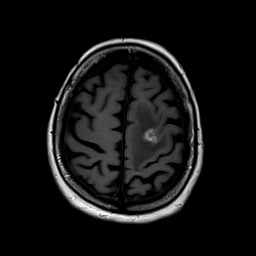
[im 151/176]
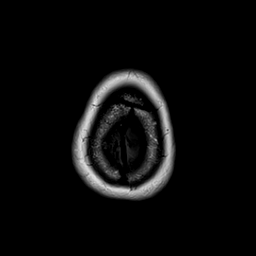
[im 176/176]
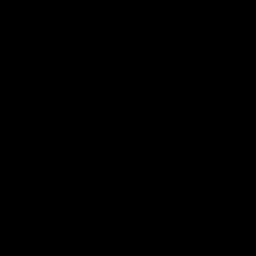

[Series 10: T2 post-contrast · coronal · 3.0mm · 0.57mm/px · 2 of 50 slices shown (1 of 2)]
[im 1/50]
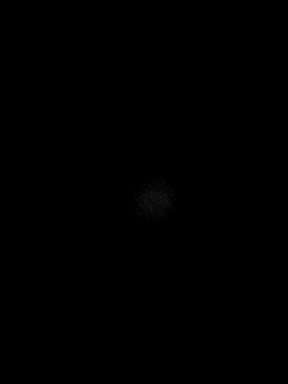
[im 50/50]
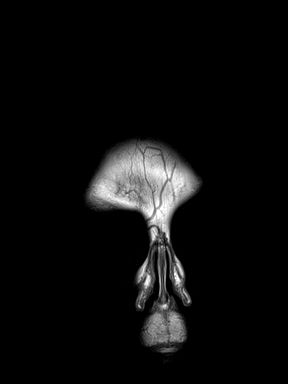

[Series 11: T2 post-contrast · axial · 1.0mm · 0.86mm/px · z∈[-49,+123]mm · 8 of 176 slices shown (2 of 2)]
[im 1/176]
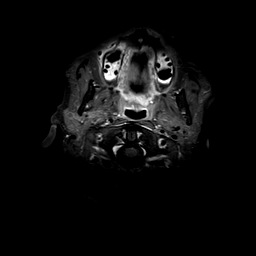
[im 26/176]
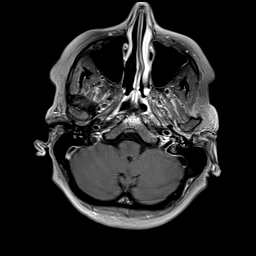
[im 51/176]
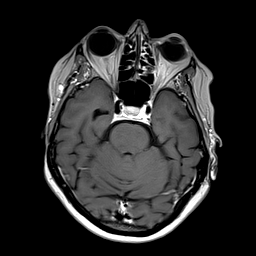
[im 76/176]
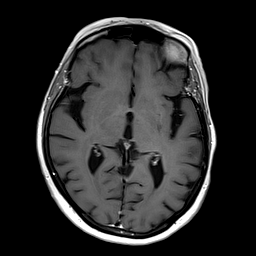
[im 101/176]
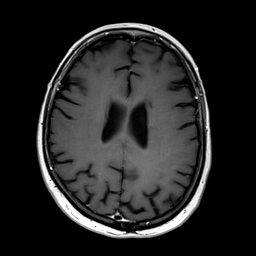
[im 126/176]
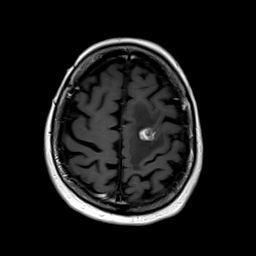
[im 151/176]
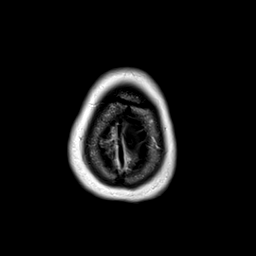
[im 176/176]
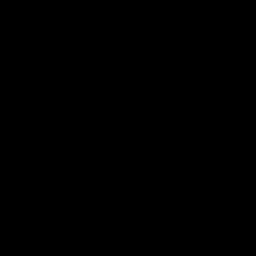

[Series 12: T1 post-contrast · axial · 1.0mm · 0.75mm/px · z∈[-43,+116]mm · 8 of 160 slices shown (1 of 2)]
[im 1/160]
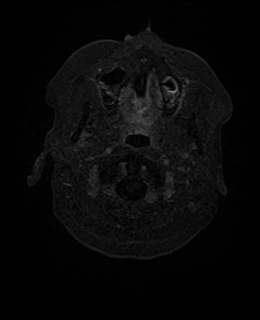
[im 23/160]
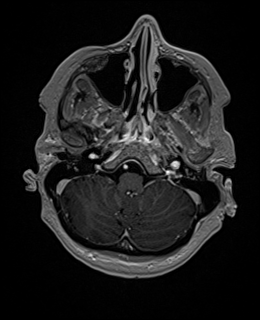
[im 46/160]
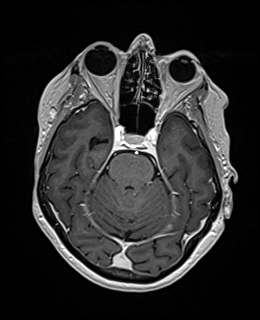
[im 69/160]
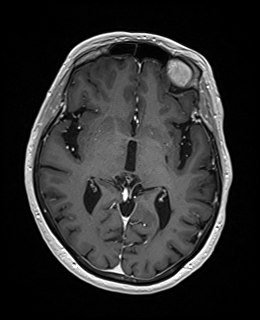
[im 91/160]
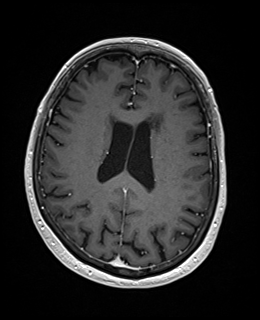
[im 114/160]
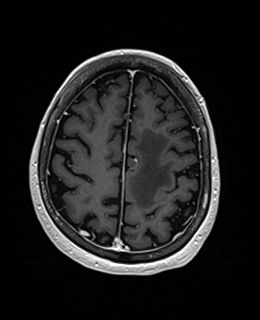
[im 137/160]
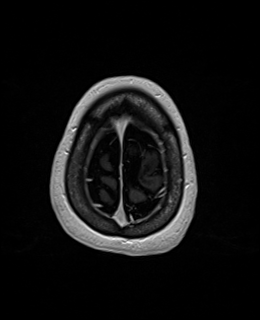
[im 160/160]
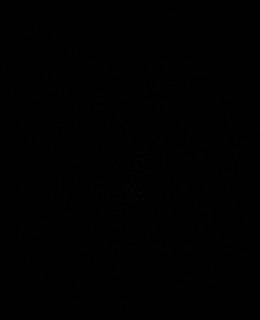

[Series 13: T1 post-contrast · coronal · 3.0mm · 0.57mm/px · 2 of 50 slices shown (2 of 2)]
[im 1/50]
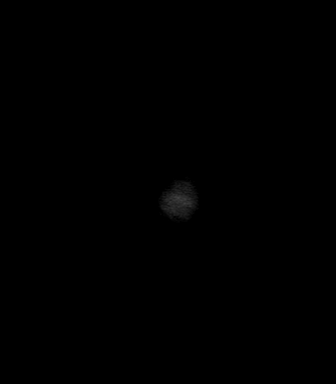
[im 50/50]
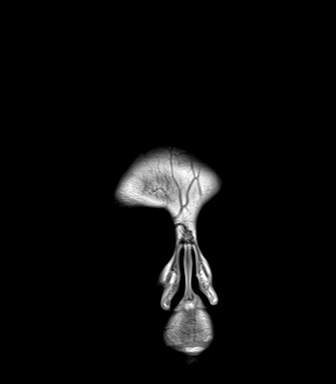

[Series 14: FLAIR post-contrast · sagittal · 3.0mm · 0.75mm/px · 2 of 39 slices shown]
[im 1/39]
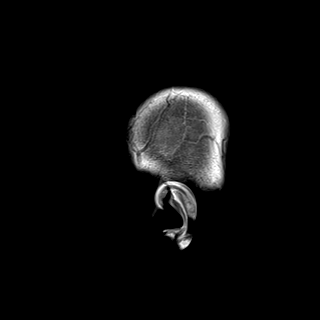
[im 39/39]
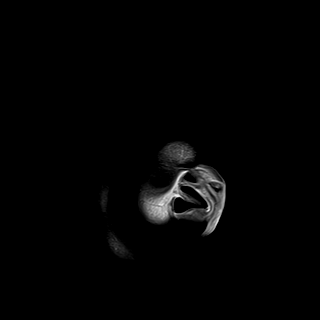

[48 of 48 positions shown; findings below may reference images not displayed]

FINDINGS: Brain: No acute infarct. Superior left frontal intraparenchymal
metastasis now measures 1.9 x 1.6 x 1.4 cm, previously 2.6 x 1.9 x
1.2 cm. Surrounding vasogenic edema has worsened. There is stippled
contrast enhancement inferior to the lesion. Otherwise, no abnormal
enhancement. Slightly increased amount of blood within the lesion.
No acute or chronic hemorrhage. Normal white matter signal,
parenchymal volume and CSF spaces. The midline structures are
normal.

Vascular: Major flow voids are preserved.

Skull and upper cervical spine: Normal calvarium and skull base.
Visualized upper cervical spine and soft tissues are normal.

Sinuses/Orbits:No paranasal sinus fluid levels or advanced mucosal
thickening. No mastoid or middle ear effusion. Normal orbits.
IMPRESSION: Decreased size of superior left frontal intraparenchymal metastasis,
now measuring 1.9 x 1.4 cm, previously 2.6 x 1.9 cm. Worsening
surrounding vasogenic edema and slightly increased internal
hemorrhage may be a post treatment effect.

## 2021-03-23 MED ORDER — GADOBENATE DIMEGLUMINE 529 MG/ML IV SOLN
15.0000 mL | Freq: Once | INTRAVENOUS | Status: AC | PRN
  Administered 2021-03-23: 15 mL via INTRAVENOUS

## 2021-03-27 ENCOUNTER — Other Ambulatory Visit: Payer: Self-pay

## 2021-03-27 ENCOUNTER — Inpatient Hospital Stay (HOSPITAL_BASED_OUTPATIENT_CLINIC_OR_DEPARTMENT_OTHER): Payer: Medicare Other | Admitting: Internal Medicine

## 2021-03-27 VITALS — BP 139/74 | HR 67 | Temp 97.8°F | Resp 17 | Ht 65.0 in | Wt 148.8 lb

## 2021-03-27 DIAGNOSIS — Z5112 Encounter for antineoplastic immunotherapy: Secondary | ICD-10-CM | POA: Diagnosis not present

## 2021-03-27 DIAGNOSIS — Z79899 Other long term (current) drug therapy: Secondary | ICD-10-CM | POA: Diagnosis not present

## 2021-03-27 DIAGNOSIS — C7931 Secondary malignant neoplasm of brain: Secondary | ICD-10-CM | POA: Diagnosis not present

## 2021-03-27 DIAGNOSIS — C211 Malignant neoplasm of anal canal: Secondary | ICD-10-CM | POA: Diagnosis not present

## 2021-03-27 DIAGNOSIS — G40109 Localization-related (focal) (partial) symptomatic epilepsy and epileptic syndromes with simple partial seizures, not intractable, without status epilepticus: Secondary | ICD-10-CM | POA: Diagnosis not present

## 2021-03-27 DIAGNOSIS — Z853 Personal history of malignant neoplasm of breast: Secondary | ICD-10-CM | POA: Diagnosis not present

## 2021-03-27 DIAGNOSIS — Z7952 Long term (current) use of systemic steroids: Secondary | ICD-10-CM | POA: Diagnosis not present

## 2021-03-27 MED ORDER — DEXAMETHASONE 1 MG PO TABS: 3.0000 mg | ORAL_TABLET | Freq: Every day | ORAL | 1 refills | Status: DC

## 2021-03-27 NOTE — Progress Notes (Signed)
[name], [name] [address] [phone]   Interval Evaluation  Date of Service: 03/27/21 Patient Name: Angela Carpenter Patient MRN: [id number] Patient DOB: July 22, 1941 Provider: [name], MD  Identifying Statement:  Angela Carpenter is a 80 y.o. female with Brain metastasis ([place]) [C79.31]   Primary Cancer:  Oncologic History: Oncology History  Malignant melanoma of anal canal ([place])  07/21/2019 Initial Diagnosis   Malignant melanoma of anal canal ([place])    07/29/2019 -  Chemotherapy      Patient is on Antibody Plan: HEAD/NECK PEMBROLIZUMAB Q21D      CNS Oncologic History 12/28/20: SRS to L frontal metastasis  Interval History:  Angela Carpenter presents today for follow up after recent MRI brain.  In terms of fatigue, energy, she continues to feel improved on decadron 29m daily, following up on our visit from last week. No seizure described in this time period.  Otherwise no other complaints or progressive symptoms.    Decadron 01/04/21: 461m taper 01/24/21: Dc'd 03/07/21: 1224m1, then 4mg90mily 03/27/21: 3mg,36mper  H+P (12/15/20) Patient presented to medical attention last week with new onset seizure, described as "right arm shaking, followed by loss of consciousness and whole body shaking".  CNS imaging demonstrated an enhancing and hemorrhagic left frontal mass, c/w melanoma met.  She was started on Keppra and discharged to home after return to baseline.  At this time, she is concerned about her Eliquis, which was stopped while in the hospital.  She has no other complaints today, no recurrence of seizures since discharge.  Planning radiosurgery for the metastasis.  Medications: Current Outpatient Medications on File Prior to Visit  Medication Sig Dispense Refill   acyclovir (ZOVIRAX) 400 MG tablet Take 400 mg by mouth 2 (two) times daily.     alendronate (FOSAMAX) 70 MG tablet Take 70 mg by mouth once  a week. Take with a full glass of water on an empty stomach.     apixaban (ELIQUIS) 5 MG TABS tablet  (Patient not taking: No sig reported)     aspirin EC 81 MG tablet Take 81 mg by mouth daily. Swallow whole.     atorvastatin (LIPITOR) 10 MG tablet Take 1 tablet (10 mg total) by mouth daily. 90 tablet 0   Cholecalciferol (VITAMIN D-3) 125 MCG (5000 UT) TABS Take 5,000 Units by mouth daily.      dexamethasone (DECADRON) 4 MG tablet Take 1 tablet (4 mg total) by mouth daily. 30 tablet 0   levETIRAcetam (KEPPRA) 500 MG tablet Take 1 tablet (500 mg total) by mouth 2 (two) times daily. 60 tablet 0   metoprolol succinate (TOPROL-XL) 25 MG 24 hr tablet Take 1 tablet (25 mg total) by mouth 2 (two) times daily. 180 tablet 0   Multiple Vitamins-Minerals (PRESERVISION AREDS 2+MULTI VIT) CAPS Take 1 capsule by mouth 2 (two) times daily.     raloxifene (EVISTA) 60 MG tablet Take 60 mg by mouth in the morning.     timolol (TIMOPTIC) 0.5 % ophthalmic solution Place 1 drop into both eyes 2 (two) times daily.     Current Facility-Administered Medications on File Prior to Visit  Medication Dose Route Frequency Provider Last Rate Last Admin   0.9 %  sodium chloride infusion  500 mL Intravenous Continuous Danis, HenryKirke Corin       Allergies: No Known Allergies Past Medical History:  Past Medical History:  Diagnosis Date   A-fib [facility])    Atrial fibrillation ([place])    4 years ago started   Brain cancer ([place]) 12/2020   Breast cancer ([place]) 2006   Colon polyps    Glaucoma    Hypercholesteremia    Hypertension    patient denies    Osteoporosis    Personal history of chemotherapy 2006   Personal history of radiation therapy 2006   Stroke (cerebrum) ([place]) 2015   Past Surgical History:  Past Surgical History:  Procedure Laterality Date   AUGMENTATION MAMMAPLASTY Bilateral 2006   Patient had them removed in 2008   BREAST BIOPSY Left 2016   BREAST LUMPECTOMY Right 2006   COLONOSCOPY     had polyps    EYE SURGERY Right    cataract   MELANOMA EXCISION WITH SENTINEL LYMPH NODE BIOPSY N/A 06/02/2019   Procedure: Left lateral ANAL MELANOMA EXCISION WITH Bilateral Groin exploration and SENTINEL node mapping;  Surgeon: [name], MD;  Location: [facility];  Service: General;  Laterality: N/A;   TONSILLECTOMY     Social History:  Social History   Socioeconomic History   Marital status: Widowed    Spouse name: Not on file   Number of children: 1   Years of education: Not on file   Highest education level: Not on file  Occupational History   Occupation: retired  Tobacco Use   Smoking status: Passive Smoke Exposure - Never Smoker   Smokeless tobacco: Never   Tobacco comments:    smoked in college but didn't inhale  Vaping Use   Vaping Use: Never used  Substance and Sexual Activity   Alcohol use: No    Alcohol/week: 0.0 standard drinks   Drug use: No   Sexual activity: Not on file  Other Topics Concern   Not on file  Social History Narrative   Not on file   Social Determinants of Health   Financial Resource Strain: Not on file  Food Insecurity: Not on file  Transportation Needs: Not on file  Physical Activity: Not on file  Stress: Not on file  Social Connections: Not on file  Intimate Partner Violence: Not on file   Family History:  Family History  Problem Relation Age of Onset   Lung cancer Mother    Hypotension Mother    Pancreatic cancer Father    Hypertension Sister    Kidney cancer Brother    Cirrhosis Maternal Grandfather    Breast cancer Paternal Grandmother    Cirrhosis Paternal Grandfather    Breast cancer Paternal Aunt     Review of Systems: Constitutional: Doesn't report fevers, chills or abnormal weight loss Eyes: Doesn't report blurriness of vision Ears, nose, mouth, throat, and face: Doesn't report sore throat Respiratory: Doesn't report cough, dyspnea or wheezes Cardiovascular: Doesn't report palpitation, chest discomfort  Gastrointestinal:   Doesn't report nausea, constipation, diarrhea GU: Doesn't report incontinence Skin: Doesn't report skin rashes Neurological: Per HPI Musculoskeletal: Doesn't report joint pain Behavioral/Psych: Doesn't report anxiety  Physical Exam: Vitals:   03/27/21 1009  BP: 139/74  Pulse: 67  Resp: 17  Temp: 97.8 F (36.6 C)  SpO2: 100%   KPS: 90. General: Alert, cooperative, pleasant, in no acute distress Head: Normal EENT: No conjunctival injection or scleral icterus.  Lungs: Resp effort normal Cardiac: Regular rate Abdomen: Non-distended abdomen Skin: No rashes cyanosis or petechiae. Extremities: No clubbing or edema  Neurologic Exam: Mental Status: Awake, alert, attentive to examiner. Oriented to self and environment. Language is  fluent with intact comprehension.  Cranial Nerves: Visual acuity is grossly normal. Visual fields are full. Extra-ocular movements intact. No ptosis. Face is symmetric Motor: Tone and bulk are normal. Subtle pronator drift on right, some fine motor impairment R hand. Reflexes are symmetric, no pathologic reflexes present.  Sensory: Intact to light touch Gait: Normal.   Labs: I have reviewed the data as listed    Component Value Date/Time   NA 140 02/15/2021 1132   NA 138 12/05/2016 0750   K 4.6 02/15/2021 1132   CL 102 02/15/2021 1132   CO2 28 02/15/2021 1132   GLUCOSE 89 02/15/2021 1132   BUN 14 02/15/2021 1132   BUN 15 12/05/2016 0750   CREATININE 0.74 02/15/2021 1132   CREATININE 0.70 09/05/2015 0828   CALCIUM 9.3 02/15/2021 1132   PROT 6.6 02/15/2021 1132   PROT 6.6 12/05/2016 0750   ALBUMIN 3.8 02/15/2021 1132   ALBUMIN 4.4 12/05/2016 0750   AST 20 02/15/2021 1132   ALT 15 02/15/2021 1132   ALKPHOS 72 02/15/2021 1132   BILITOT 0.5 02/15/2021 1132   GFRNONAA >60 02/15/2021 1132   GFRAA >60 06/30/2020 0840   Lab Results  Component Value Date   WBC 10.4 02/15/2021   NEUTROABS 6.5 02/15/2021   HGB 13.4 02/15/2021   HCT 41.4  02/15/2021   MCV 96.1 02/15/2021   PLT 247 02/15/2021    Imaging:  [facility] Clinician Interpretation: I have personally reviewed the CNS images as listed.  My interpretation, in the context of the patient's clinical presentation, is stable disease  MR BRAIN W WO CONTRAST  Result Date: 03/23/2021 CLINICAL DATA:  Intracranial metastatic disease status post stereotactic radio surgery. EXAM: MRI HEAD WITHOUT AND WITH CONTRAST TECHNIQUE: Multiplanar, multiecho pulse sequences of the brain and surrounding structures were obtained without and with intravenous contrast. CONTRAST:  75m MULTIHANCE GADOBENATE DIMEGLUMINE 529 MG/ML IV SOLN COMPARISON:  12/20/2020 FINDINGS: Brain: No acute infarct. Superior left frontal intraparenchymal metastasis now measures 1.9 x 1.6 x 1.4 cm, previously 2.6 x 1.9 x 1.2 cm. Surrounding vasogenic edema has worsened. There is stippled contrast enhancement inferior to the lesion. Otherwise, no abnormal enhancement. Slightly increased amount of blood within the lesion. No acute or chronic hemorrhage. Normal white matter signal, parenchymal volume and CSF spaces. The midline structures are normal. Vascular: Major flow voids are preserved. Skull and upper cervical spine: Normal calvarium and skull base. Visualized upper cervical spine and soft tissues are normal. Sinuses/Orbits:No paranasal sinus fluid levels or advanced mucosal thickening. No mastoid or middle ear effusion. Normal orbits. IMPRESSION: Decreased size of superior left frontal intraparenchymal metastasis, now measuring 1.9 x 1.4 cm, previously 2.6 x 1.9 cm. Worsening surrounding vasogenic edema and slightly increased internal hemorrhage may be a post treatment effect. Electronically Signed   By: KUlyses JarredM.D.   On: 03/23/2021 21:20    Assessment/Plan Brain [place] [C79.31]  Angela VARIis clinically stable today.  MRI demonstrates stable enhancing focus of treated disease, with increased inflammatory  burden.  Recommend decreasing decadron to 313mdaily x7 days, then 32m47maily x7 days, then 1mg532mily x7 days, then stopping.  Should continue to hold Eliquis due to ongoing CNS hemorrhage risk, good control of atrial fibrillation.    Will otherwise continue Keppra 500mg40m.  We ask that SusanCHARVI GAMMAGErn to clinic in 3 months following next MRI brain.  We spent twenty additional minutes teaching regarding the natural history, biology, and historical experience in the treatment  of neurologic complications of cancer.   We appreciate the opportunity to participate in the care of Angela Carpenter.   All questions were answered. The patient knows to call the clinic with any problems, questions or concerns. No barriers to learning were detected.  The total time spent in the encounter was 30 minutes and more than 50% was on counseling and review of test results   [name], MD Medical Director of Neuro-Oncology [facility] [place] 03/27/21 9:59 AM

## 2021-03-28 ENCOUNTER — Ambulatory Visit
Admission: RE | Admit: 2021-03-28 | Discharge: 2021-03-28 | Disposition: A | Discharge status: Home / Self Care | Payer: Medicare Other | Source: Ambulatory Visit | Attending: Internal Medicine | Admitting: Internal Medicine

## 2021-03-28 DIAGNOSIS — Z1231 Encounter for screening mammogram for malignant neoplasm of breast: Secondary | ICD-10-CM

## 2021-03-28 IMAGING — MG MM DIGITAL SCREENING BILAT W/ TOMO AND CAD
6 of 12 series · 6 of 36 positions shown · non-contrast
Comparison: Previous exam(s).

CLINICAL DATA: Screening.

EXAM:
DIGITAL SCREENING BILATERAL MAMMOGRAM WITH TOMOSYNTHESIS AND CAD
TECHNIQUE: Bilateral screening digital craniocaudal and mediolateral oblique
mammograms were obtained. Bilateral screening digital breast
tomosynthesis was performed. The images were evaluated with
computer-aided detection.

[L MLO synth-2D]
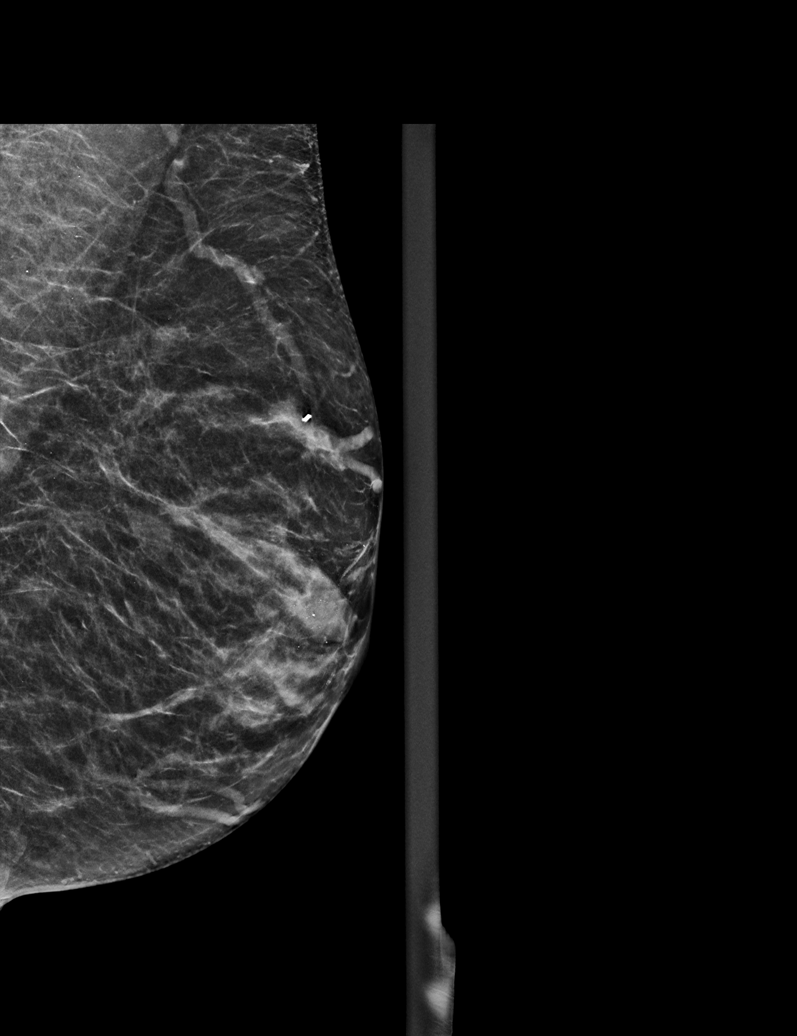

[R CC synth-2D (1 of 2)]
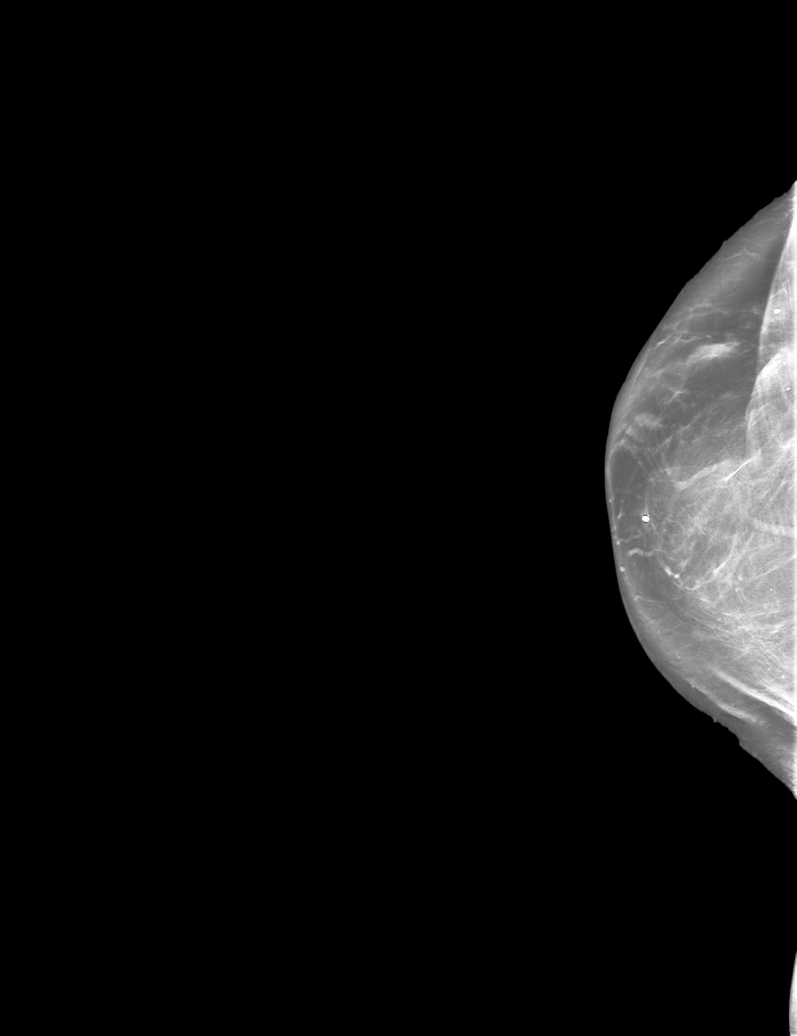

[L CC synth-2D]
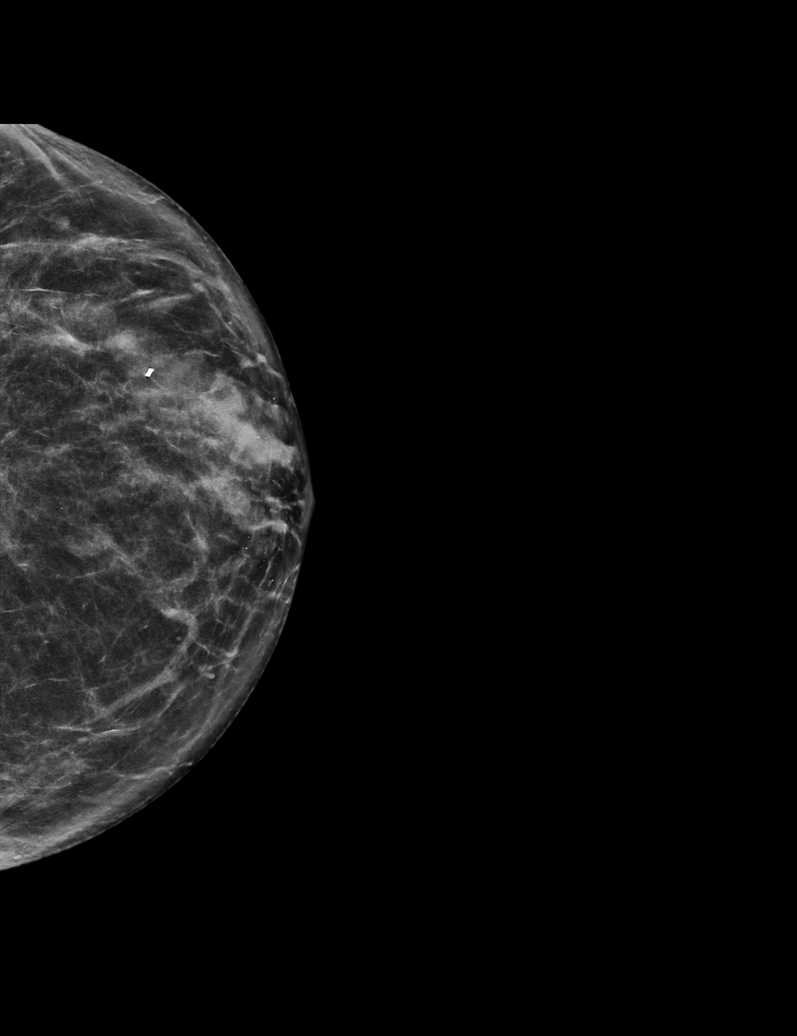

[R CC synth-2D (2 of 2)]
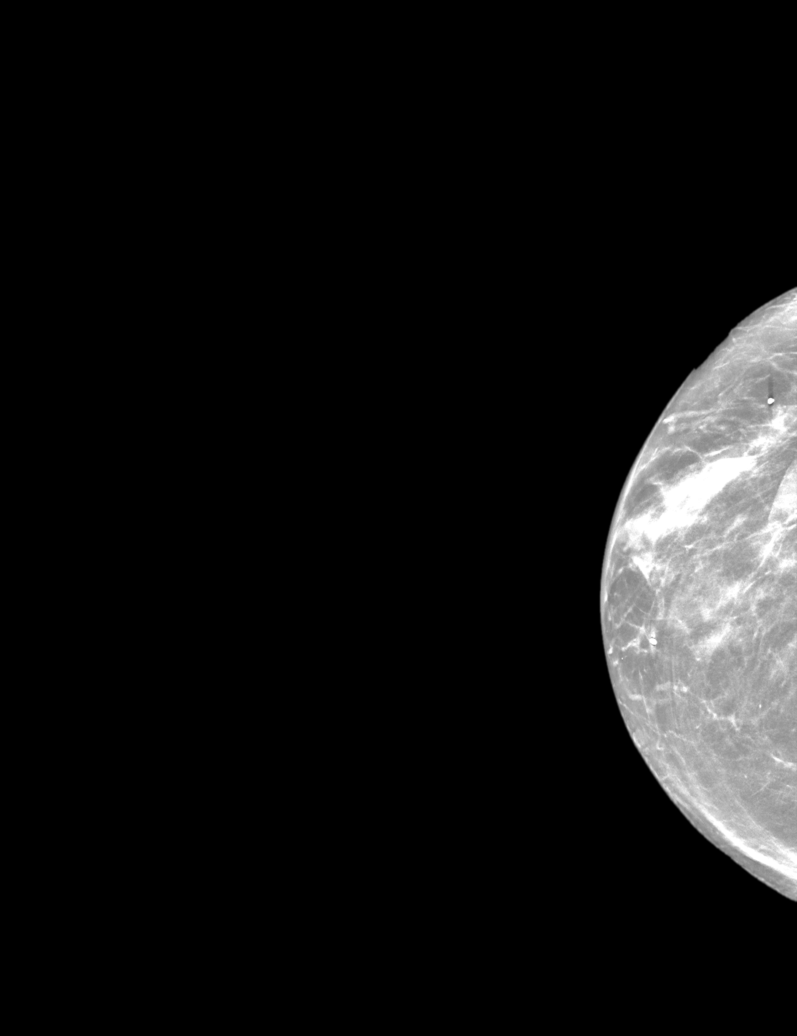

[R MLO synth-2D (1 of 2)]
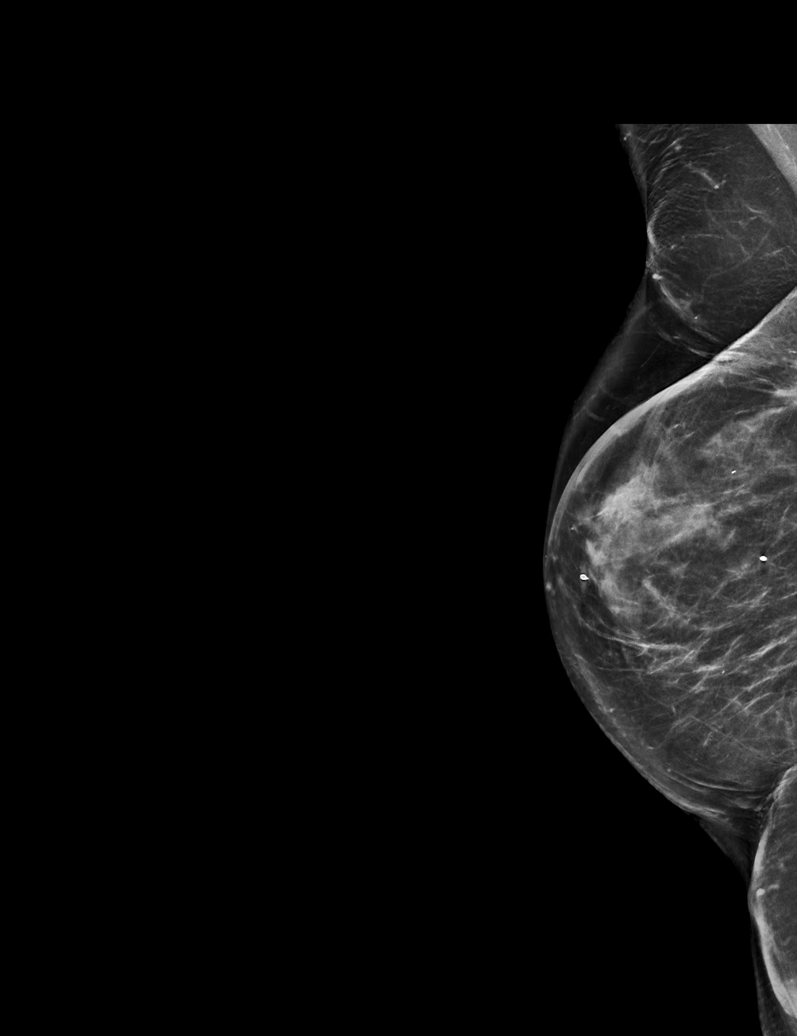

[R MLO synth-2D (2 of 2)]
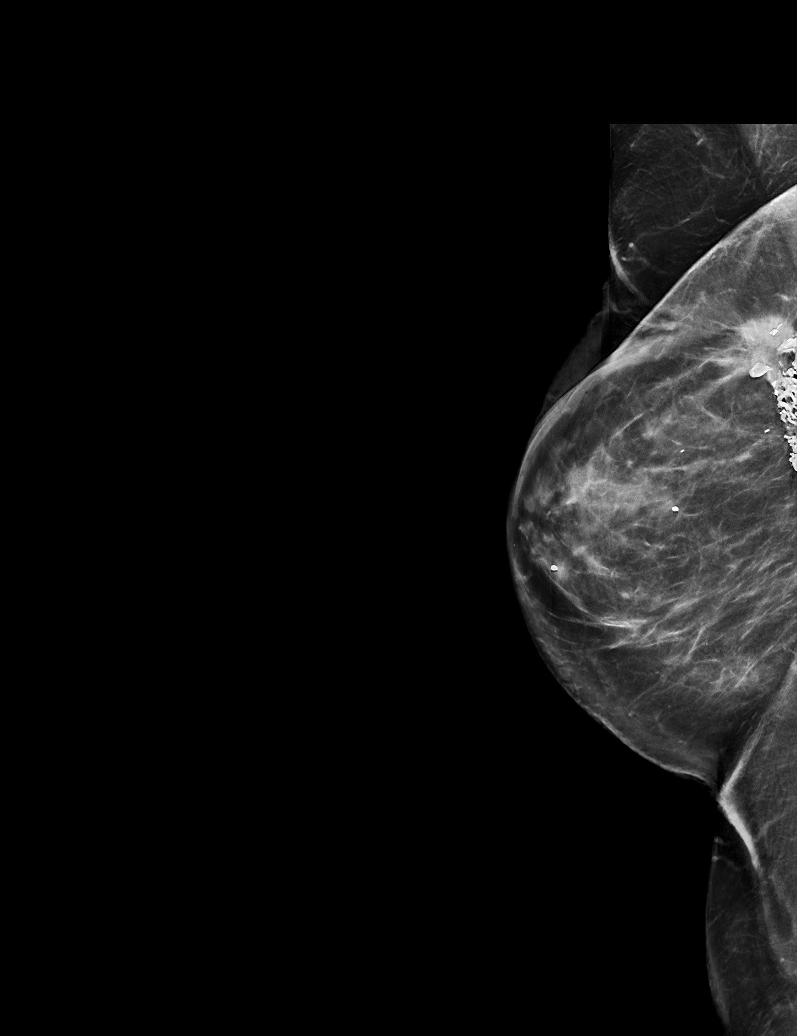

[6 of 36 positions shown; findings below may reference images not displayed]

ACR Breast Density Category c: The breast tissue is heterogeneously
dense, which may obscure small masses.
FINDINGS: There are no findings suspicious for malignancy.
IMPRESSION: No mammographic evidence of malignancy. A result letter of this
screening mammogram will be mailed directly to the patient.

RECOMMENDATION:
Screening mammogram in one year. (Code:[V2])

BI-RADS CATEGORY  1: Negative.

## 2021-03-29 ENCOUNTER — Other Ambulatory Visit: Payer: Self-pay

## 2021-03-29 ENCOUNTER — Inpatient Hospital Stay: Payer: Medicare Other

## 2021-03-29 ENCOUNTER — Inpatient Hospital Stay (HOSPITAL_BASED_OUTPATIENT_CLINIC_OR_DEPARTMENT_OTHER): Payer: Medicare Other | Admitting: Oncology

## 2021-03-29 VITALS — BP 151/85 | HR 75 | Temp 97.5°F | Resp 17 | Ht 65.0 in | Wt 149.8 lb

## 2021-03-29 DIAGNOSIS — Z5112 Encounter for antineoplastic immunotherapy: Secondary | ICD-10-CM | POA: Diagnosis not present

## 2021-03-29 DIAGNOSIS — C211 Malignant neoplasm of anal canal: Secondary | ICD-10-CM

## 2021-03-29 DIAGNOSIS — Z7952 Long term (current) use of systemic steroids: Secondary | ICD-10-CM | POA: Diagnosis not present

## 2021-03-29 DIAGNOSIS — C7931 Secondary malignant neoplasm of brain: Secondary | ICD-10-CM | POA: Diagnosis not present

## 2021-03-29 DIAGNOSIS — E039 Hypothyroidism, unspecified: Secondary | ICD-10-CM

## 2021-03-29 DIAGNOSIS — Z79899 Other long term (current) drug therapy: Secondary | ICD-10-CM | POA: Diagnosis not present

## 2021-03-29 DIAGNOSIS — Z853 Personal history of malignant neoplasm of breast: Secondary | ICD-10-CM | POA: Diagnosis not present

## 2021-03-29 LAB — TSH: TSH: 2.572 u[IU]/mL (ref 0.308–3.960)

## 2021-03-29 LAB — CMP (CANCER CENTER ONLY)
ALT: 35 U/L (ref 0–44)
AST: 19 U/L (ref 15–41)
Albumin: 3.7 g/dL (ref 3.5–5.0)
Alkaline Phosphatase: 59 U/L (ref 38–126)
Anion gap: 10 (ref 5–15)
BUN: 14 mg/dL (ref 8–23)
CO2: 25 mmol/L (ref 22–32)
Calcium: 8.9 mg/dL (ref 8.9–10.3)
Chloride: 101 mmol/L (ref 98–111)
Creatinine: 0.68 mg/dL (ref 0.44–1.00)
GFR, Estimated: >60 mL/min (ref >60)
Glucose, Bld: 95 mg/dL (ref 70–99)
Potassium: 4.3 mmol/L (ref 3.5–5.1)
Sodium: 136 mmol/L (ref 135–145)
Total Bilirubin: 0.5 mg/dL (ref 0.3–1.2)
Total Protein: 6.4 g/dL — ABNORMAL LOW (ref 6.5–8.1)

## 2021-03-29 LAB — CBC WITH DIFFERENTIAL (CANCER CENTER ONLY)
Abs Immature Granulocytes: 0.23 10*3/uL — ABNORMAL HIGH (ref 0.00–0.07)
Basophils Absolute: 0.1 10*3/uL (ref 0.0–0.1)
Basophils Relative: 0 %
Eosinophils Absolute: 0.1 10*3/uL (ref 0.0–0.5)
Eosinophils Relative: 0 %
HCT: 41.6 % (ref 36.0–46.0)
Hemoglobin: 13.8 g/dL (ref 12.0–15.0)
Immature Granulocytes: 1 %
Lymphocytes Relative: 8 %
Lymphs Abs: 1.3 10*3/uL (ref 0.7–4.0)
MCH: 31.3 pg (ref 26.0–34.0)
MCHC: 33.2 g/dL (ref 30.0–36.0)
MCV: 94.3 fL (ref 80.0–100.0)
Monocytes Absolute: 0.7 10*3/uL (ref 0.1–1.0)
Monocytes Relative: 4 %
Neutro Abs: 13.5 10*3/uL — ABNORMAL HIGH (ref 1.7–7.7)
Neutrophils Relative %: 87 %
Platelet Count: 277 10*3/uL (ref 150–400)
RBC: 4.41 MIL/uL (ref 3.87–5.11)
RDW: 14 % (ref 11.5–15.5)
WBC Count: 15.9 10*3/uL — ABNORMAL HIGH (ref 4.0–10.5)
nRBC: 0 % (ref 0.0–0.2)

## 2021-03-29 MED ORDER — SODIUM CHLORIDE 0.9 % IV SOLN
400.0000 mg | Freq: Once | INTRAVENOUS | Status: AC
  Administered 2021-03-29: 400 mg via INTRAVENOUS
  Filled 2021-03-29: qty 16

## 2021-03-29 MED ORDER — SODIUM CHLORIDE 0.9 % IV SOLN
Freq: Once | INTRAVENOUS | Status: AC
  Filled 2021-03-29: qty 250

## 2021-03-29 NOTE — Patient Instructions (Signed)
[facility]  Discharge Instructions: °Thank you for choosing [facility] to provide your oncology and hematology care.  ° °If you have a lab appointment with the Cancer Center, please go directly to the Cancer Center and check in at the registration area. °  °Wear comfortable clothing and clothing appropriate for easy access to any Portacath or PICC line.  ° °We strive to give you quality time with your provider. You may need to reschedule your appointment if you arrive late (15 or more minutes).  Arriving late affects you and other patients whose appointments are after yours.  Also, if you miss three or more appointments without notifying the office, you may be dismissed from the clinic at the provider’s discretion.    °  °For prescription refill requests, have your pharmacy contact our office and allow 72 hours for refills to be completed.   ° °Today you received the following chemotherapy and/or immunotherapy agent: Pembrolizumab (Keytruda) °  °To help prevent nausea and vomiting after your treatment, we encourage you to take your nausea medication as directed. ° °BELOW ARE SYMPTOMS THAT SHOULD BE REPORTED IMMEDIATELY: °*FEVER GREATER THAN 100.4 F (38 °C) OR HIGHER °*CHILLS OR SWEATING °*NAUSEA AND VOMITING THAT IS NOT CONTROLLED WITH YOUR NAUSEA MEDICATION °*UNUSUAL SHORTNESS OF BREATH °*UNUSUAL BRUISING OR BLEEDING °*URINARY PROBLEMS (pain or burning when urinating, or frequent urination) °*BOWEL PROBLEMS (unusual diarrhea, constipation, pain near the anus) °TENDERNESS IN MOUTH AND THROAT WITH OR WITHOUT PRESENCE OF ULCERS (sore throat, sores in mouth, or a toothache) °UNUSUAL RASH, SWELLING OR PAIN  °UNUSUAL VAGINAL DISCHARGE OR ITCHING  ° °Items with * indicate a potential emergency and should be followed up as soon as possible or go to the Emergency Department if any problems should occur. ° °Please show the CHEMOTHERAPY ALERT CARD or IMMUNOTHERAPY ALERT CARD at  check-in to the Emergency Department and triage nurse. ° °Should you have questions after your visit or need to cancel or reschedule your appointment, please contact [facility]  Dept: [phone]  and follow the prompts.  Office hours are 8:00 a.m. to 4:30 p.m. Monday - Friday. Please note that voicemails left after 4:00 p.m. may not be returned until the following business day.  We are closed weekends and major holidays. You have access to a nurse at all times for urgent questions. Please call the main number to the clinic Dept: [phone] and follow the prompts. ° ° °For any non-urgent questions, you may also contact your provider using MyChart. We now offer e-Visits for anyone 18 and older to request care online for non-urgent symptoms. For details visit [email or web address]. °  °Also download the MyChart app! Go to the app store, search "MyChart", open the app, select [facility], and log in with your MyChart username and password. ° °Due to Covid, a mask is required upon entering the hospital/clinic. If you do not have a mask, one will be given to you upon arrival. For doctor visits, patients may have 1 support person aged 18 or older with them. For treatment visits, patients cannot have anyone with them due to current Covid guidelines and our immunocompromised population.  ° °

## 2021-03-29 NOTE — Progress Notes (Signed)
Hematology and Oncology Follow Up Visit  Angela Carpenter [id number] 29-May-1941 80 y.o. 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  She developed stage IV disease with pelvic involvement subsequently CNS disease.  Prior Therapy:   She is status post perianal excision in August 2020.  Stereotactic radiosurgery completed on December 28, 2020.  He received 18 Gray in 1 fraction.  Pembrolizumab 200 mg every 3 weeks started on July 29, 2019.  Last treatment given on November 08, 2020.  Current therapy: Pembrolizumab 200 mg every 3 weeks and restarted on January 04, 2021.  She is currently on 400 mg every 6 weeks started on Feb 15, 2021.  Interim History: Angela Carpenter returns today for repeat evaluation.  Since her last visit, she reports feeling reasonably well without any major complaints.  She has reported some fatigue and tiredness and weakness on the left side.  MRI obtained under the care of Dr. Mickeal Skinner which showed vasogenic edema around the treatment site.  She received tapering doses of dexamethasone with improvement in her fatigue and weakness.  She remains ambulatory without any recent falls or syncope.  She denies any seizures or lethargy.  She has tolerated Pembrolizumab at the current dose without any issues.     Medications: Updated on review. Current Outpatient Medications  Medication Sig Dispense Refill   acyclovir (ZOVIRAX) 400 MG tablet Take 400 mg by mouth 2 (two) times daily.     alendronate (FOSAMAX) 70 MG tablet Take 70 mg by mouth once a week. Take with a full glass of water on an empty stomach.     apixaban (ELIQUIS) 5 MG TABS tablet  (Patient not taking: No sig reported)     aspirin EC 81 MG tablet Take 81 mg by mouth daily. Swallow whole.     atorvastatin (LIPITOR) 10 MG tablet Take 1 tablet (10 mg total) by mouth daily. 90 tablet 0   Cholecalciferol (VITAMIN D-3) 125  MCG (5000 UT) TABS Take 5,000 Units by mouth daily.      dexamethasone (DECADRON) 1 MG tablet Take 3 tablets (3 mg total) by mouth daily. 90 tablet 1   levETIRAcetam (KEPPRA) 500 MG tablet Take 1 tablet (500 mg total) by mouth 2 (two) times daily. 60 tablet 0   metoprolol succinate (TOPROL-XL) 25 MG 24 hr tablet Take 1 tablet (25 mg total) by mouth 2 (two) times daily. 180 tablet 0   Multiple Vitamins-Minerals (PRESERVISION AREDS 2+MULTI VIT) CAPS Take 1 capsule by mouth 2 (two) times daily.     raloxifene (EVISTA) 60 MG tablet Take 60 mg by mouth in the morning.     timolol (TIMOPTIC) 0.5 % ophthalmic solution Place 1 drop into both eyes 2 (two) times daily.     Current Facility-Administered Medications  Medication Dose Route Frequency Provider Last Rate Last Admin   0.9 %  sodium chloride infusion  500 mL Intravenous Continuous Danis, Estill Cotta III, MD         Allergies: No Known Allergies     Physical Exam:    Blood pressure (!) 151/85, pulse 75, temperature (!) 97.5 F (36.4 C), temperature source Tympanic, resp. rate 17, height 5\' 5"  (1.651 m), weight 149 lb 12.8 oz (67.9 kg), SpO2 100 %.    ECOG: 1   General appearance: Comfortable appearing without any discomfort Head: Normocephalic without any trauma Oropharynx: Mucous membranes are moist and pink without any thrush  or ulcers. Eyes: Pupils are equal and round reactive to light. Lymph nodes: No cervical, supraclavicular, inguinal or axillary lymphadenopathy.   Heart:regular rate and rhythm.  S1 and S2 without leg edema. Lung: Clear without any rhonchi or wheezes.  No dullness to percussion. Abdomin: Soft, nontender, nondistended with good bowel sounds.  No hepatosplenomegaly. Musculoskeletal: No joint deformity or effusion.  Full range of motion noted. Neurological: No deficits noted on motor, sensory and deep tendon reflex exam. Skin: No petechial rash or dryness.  Appeared moist.                                Lab Results: Lab Results  Component Value Date   WBC 10.4 02/15/2021   HGB 13.4 02/15/2021   HCT 41.4 02/15/2021   MCV 96.1 02/15/2021   PLT 247 02/15/2021     Chemistry      Component Value Date/Time   NA 140 02/15/2021 1132   NA 138 12/05/2016 0750   K 4.6 02/15/2021 1132   CL 102 02/15/2021 1132   CO2 28 02/15/2021 1132   BUN 14 02/15/2021 1132   BUN 15 12/05/2016 0750   CREATININE 0.74 02/15/2021 1132   CREATININE 0.70 09/05/2015 0828      Component Value Date/Time   CALCIUM 9.3 02/15/2021 1132   ALKPHOS 72 02/15/2021 1132   AST 20 02/15/2021 1132   ALT 15 02/15/2021 1132   BILITOT 0.5 02/15/2021 1132            Impression and Plan:  80 year old with:   1.   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  The natural course of her disease was reviewed at this time and risks and benefits of continuing this treatment were discussed.  She continues to tolerate Pembrolizumab without any concerns or complications.  Complications that include immune mediated issues, GI toxicity were discussed.  I will plan to update her staging scans before the next visit.  Salvage therapy with combination ipilimumab and nivolumab would be used as a salvage option.  She will have a PET scan scheduled at the end of July 2022.  We will discuss these results via phone prior to her scheduled infusion treatment in August 2022.  2.  Immune mediated complications: I continue to educate her about potential complication clinic pneumonitis, colitis and thyroid disease.  He is not experiencing any at this time.   3.  Dermatology surveillance: I recommended continued active surveillance at this time.   4.  Antiemetics: No nausea or vomiting reported at this time.  Compazine is available to her.  5.  CNS metastasis: MRI obtained March 23, 2021 and did not show any evidence of  disease progression.  She continues to follow with Dr. Mickeal Skinner.  6.  Breast cancer: Mammogram did not show any evidence of relapsed disease.  7.   Follow-up: She will return in 6 weeks for a follow-up visit.  30 minutes were spent on this encounter.  Time was dedicated to reviewing laboratory data, disease status update and outlining future plan of care.   Zola Button, MD 6/23/202211:36 AM

## 2021-04-04 ENCOUNTER — Other Ambulatory Visit: Payer: Self-pay | Admitting: Radiation Therapy

## 2021-04-05 ENCOUNTER — Other Ambulatory Visit: Payer: Self-pay | Admitting: Radiation Therapy

## 2021-04-06 ENCOUNTER — Telehealth: Payer: Self-pay | Admitting: Internal Medicine

## 2021-04-06 NOTE — Telephone Encounter (Signed)
Scheduled appt per 6/30 sch msg. Pt aware.  

## 2021-04-10 ENCOUNTER — Telehealth: Payer: Self-pay | Admitting: Radiation Therapy

## 2021-04-10 NOTE — Telephone Encounter (Addendum)
Angela Carpenter called with complaints of extreme weakness in her Rt arm/side. She was given a steroid Rx with taper instructions on 6/21. The instructions were to take tid x one week and then decrease to bid x one week. After taking med bid for four days she noticed worsening of her symptoms, so she resumed taking the medication tid. She left a message requesting instruction and recommendation since there has been no improvements in her Rt sided weakness.  I have routed her message to Dr. Mickeal Skinner and his nurse, Rhae Hammock RN requesting a call back to the patient.   Mont Dutton R.T.(R)(T) Radiation Special Procedures Navigator

## 2021-04-11 ENCOUNTER — Telehealth: Payer: Self-pay | Admitting: *Deleted

## 2021-04-11 NOTE — Telephone Encounter (Signed)
Received message from Angela Carpenter that pt is concerned about returning symptoms of weakness in right arm and right leg. Pt has been on a steroid taper and noted symptoms to be returning when she got to 2mg  dexamethasone daily.  She went back to 3mg  daily without much improvement. Per Dr. Mickeal Skinner note-he recommended "to go back to 4mg  TID"  Pt states she has not ever takne 4mg  three x a day. She is willing to do 4 mg daily and then we can clarify with Dr. Mickeal Skinner whether he wants her to take more often.  Dr. Mickeal Skinner also requested phone visit on Friday-though he is at [facility] on Friday-this nurse cannot see his schedule @ [facility].  Please clarify Dexamethasone doage and frquency and time of phone visit. Pt is home all the time and can take call at any time

## 2021-04-12 ENCOUNTER — Encounter: Payer: Self-pay | Admitting: Oncology

## 2021-04-12 ENCOUNTER — Telehealth: Payer: Self-pay | Admitting: *Deleted

## 2021-04-12 NOTE — Telephone Encounter (Signed)
Spoke with patient.  She stated she increased her Decadron dose to once a day and saw some improvement however, she is still having to hold onto everything in the house while getting around.    I advised that Dr Mickeal Skinner wanted her to increase to TID and she feels she has enough medication on hand to accomplish that for a little bit.  Phone visit to  follow tomorrow.

## 2021-04-13 ENCOUNTER — Inpatient Hospital Stay: Payer: Medicare Other | Attending: Internal Medicine | Admitting: Internal Medicine

## 2021-04-13 ENCOUNTER — Encounter: Payer: Self-pay | Admitting: Internal Medicine

## 2021-04-13 DIAGNOSIS — C7931 Secondary malignant neoplasm of brain: Secondary | ICD-10-CM | POA: Diagnosis not present

## 2021-04-13 NOTE — Progress Notes (Signed)
I connected with Angela Carpenter on 04/13/21 at 10:00 AM EDT by telephone visit and verified that I am speaking with the correct person using two identifiers.  I discussed the limitations, risks, security and privacy concerns of performing an evaluation and management service by telemedicine and the availability of in-person appointments. I also discussed with the patient that there may be a patient responsible charge related to this service. The patient expressed understanding and agreed to proceed.  Other persons participating in the visit and their role in the encounter:  n/a  Patient's location:  Home  Provider's location:  Office  Chief Complaint:  Brain metastasis ([place])  History of Present Ilness: Angela Carpenter describes significant improvement in right sided weakness after recent pulse of higher dose steroids.  Earlier in the week, after tapering down from 2mg  to 1mg , she experienced significant recurrence of weakness.  She was unable to walk or even lift her right arm.  Though she is not quite at her baseline and still experiences some dysfunction, it is considerably improved from Monday/tuesday Observations: Language and cognition at baseline Assessment and Plan: Brain metastasis ([place]) Focal deficits secondary to post-RT inflammatory process within L frontal lobe.  Recommended decreasing decadron to 4mg  daily, then lowering by 1mg  each 5 days until at 2mg  daily. Follow Up Instructions: RTC as scheduled following next MRI brain in 2 months, or sooner if needed  I discussed the assessment and treatment plan with the patient.  The patient was provided an opportunity to ask questions and all were answered.  The patient agreed with the plan and demonstrated understanding of the instructions.    The patient was advised to call back or seek an in-person evaluation if the symptoms worsen or if the condition fails to improve as anticipated.  I provided 5-10 minutes of non-face-to-face time during this  enocunter.  [name], MD   I provided 15 minutes of non face-to-face telephone visit time during this encounter, and > 50% was spent counseling as documented under my assessment & plan.

## 2021-04-18 NOTE — Progress Notes (Deleted)
Cardiology Office Note:    Date:  04/18/2021   ID:  Angela Carpenter, DOB 1941/09/23, MRN [id number]  PCP:  Haywood Pao, MD   [facility] Providers Cardiologist:  [name], MD (Inactive) { Click to update primary MD,subspecialty MD or APP then REFRESH:1}    Referring MD: Tisovec, [name], MD   No chief complaint on file. ***  History of Present Illness:    Angela Carpenter is a 80 y.o. female with a hx of ***  Past Medical History:  Diagnosis Date   A-fib [facility])    Atrial fibrillation ([place])    4 years ago started   Brain cancer ([place]) 12/2020   Breast cancer ([place]) 2006   Colon polyps    Glaucoma    Hypercholesteremia    Hypertension    patient denies    Osteoporosis    Personal history of chemotherapy 2006   Personal history of radiation therapy 2006   Stroke (cerebrum) ([place]) 2015    Past Surgical History:  Procedure Laterality Date   AUGMENTATION MAMMAPLASTY Bilateral 2006   Patient had them removed in 2008   BREAST BIOPSY Left 2016   BREAST LUMPECTOMY Right 2006   COLONOSCOPY     had polyps   EYE SURGERY Right    cataract   MELANOMA EXCISION WITH SENTINEL LYMPH NODE BIOPSY N/A 06/02/2019   Procedure: Left lateral ANAL MELANOMA EXCISION WITH Bilateral Groin exploration and SENTINEL node mapping;  Surgeon: [name], MD;  Location: [place];  Service: General;  Laterality: N/A;   TONSILLECTOMY      Current Medications: No outpatient medications have been marked as taking for the 04/23/21 encounter (Appointment) with Freada Bergeron, MD.   Current Facility-Administered Medications for the 04/23/21 encounter (Appointment) with Freada Bergeron, MD  Medication   0.9 %  sodium chloride infusion     Allergies:   Patient has no known allergies.   Social History   Socioeconomic History   Marital status: Widowed    Spouse name: Not on file   Number of children: 1   Years of education: Not on file   Highest education level: Not on file   Occupational History   Occupation: retired  Tobacco Use   Smoking status: Passive Smoke Exposure - Never Smoker   Smokeless tobacco: Never   Tobacco comments:    smoked in college but didn't inhale  Vaping Use   Vaping Use: Never used  Substance and Sexual Activity   Alcohol use: No    Alcohol/week: 0.0 standard drinks   Drug use: No   Sexual activity: Not on file  Other Topics Concern   Not on file  Social History Narrative   Not on file   Social Determinants of Health   Financial Resource Strain: Not on file  Food Insecurity: Not on file  Transportation Needs: Not on file  Physical Activity: Not on file  Stress: Not on file  Social Connections: Not on file     Family History: The patient's ***family history includes Breast cancer in her paternal aunt and paternal grandmother; Cirrhosis in her maternal grandfather and paternal grandfather; Hypertension in her sister; Hypotension in her mother; Kidney cancer in her brother; Lung cancer in her mother; Pancreatic cancer in her father.  ROS:   Please see the history of present illness.    *** All other systems reviewed and are negative.  EKGs/Labs/Other Studies Reviewed:    The following studies were reviewed today: ***  EKG:  EKG is *** ordered today.  The ekg ordered today demonstrates ***  Recent Labs: 03/29/2021: ALT 35; BUN 14; Creatinine 0.68; Hemoglobin 13.8; Platelet Count 277; Potassium 4.3; Sodium 136; TSH 2.572  Recent Lipid Panel    Component Value Date/Time   CHOL 151 12/05/2016 0750   TRIG 69 12/05/2016 0750   HDL 72 12/05/2016 0750   CHOLHDL 2.1 12/05/2016 0750   CHOLHDL 1.8 09/05/2015 0828   VLDL 18 09/05/2015 0828   LDLCALC 65 12/05/2016 0750     Risk Assessment/Calculations:   {Does this patient have ATRIAL FIBRILLATION?:[phone]}       Physical Exam:    VS:  There were no vitals taken for this visit.    Wt Readings from Last 3 Encounters:  03/29/21 149 lb 12.8 oz (67.9 kg)   03/27/21 148 lb 12.8 oz (67.5 kg)  03/19/21 147 lb 12.8 oz (67 kg)     GEN: *** Well nourished, well developed in no acute distress HEENT: Normal NECK: No JVD; No carotid bruits LYMPHATICS: No lymphadenopathy CARDIAC: ***RRR, no murmurs, rubs, gallops RESPIRATORY:  Clear to auscultation without rales, wheezing or rhonchi  ABDOMEN: Soft, non-tender, non-distended MUSCULOSKELETAL:  No edema; No deformity  SKIN: Warm and dry NEUROLOGIC:  Alert and oriented x 3 PSYCHIATRIC:  Normal affect   ASSESSMENT:    No diagnosis found. PLAN:    In order of problems listed above:  ***   {Are you ordering a CV Procedure (e.g. stress test, cath, DCCV, TEE, etc)?   Press F2        :[id number]}    Medication Adjustments/Labs and Tests Ordered: Current medicines are reviewed at length with the patient today.  Concerns regarding medicines are outlined above.  No orders of the defined types were placed in this encounter.  No orders of the defined types were placed in this encounter.   There are no Patient Instructions on file for this visit.   Signed, Freada Bergeron, MD  04/18/2021 8:38 PM    [place]

## 2021-04-23 ENCOUNTER — Other Ambulatory Visit: Payer: Self-pay

## 2021-04-23 ENCOUNTER — Encounter: Payer: Self-pay | Admitting: Oncology

## 2021-04-23 ENCOUNTER — Ambulatory Visit: Payer: Medicare Other | Admitting: Cardiology

## 2021-04-23 ENCOUNTER — Emergency Department (HOSPITAL_COMMUNITY): Payer: Medicare Other

## 2021-04-23 ENCOUNTER — Encounter (HOSPITAL_COMMUNITY): Payer: Self-pay | Admitting: *Deleted

## 2021-04-23 ENCOUNTER — Inpatient Hospital Stay (HOSPITAL_COMMUNITY)
Admission: EM | Admit: 2021-04-23 | Discharge: 2021-05-03 | DRG: 025 | Disposition: A | Discharge status: Rehabilitation Facility | Payer: Medicare Other | Attending: Internal Medicine | Admitting: Internal Medicine

## 2021-04-23 ENCOUNTER — Telehealth: Payer: Self-pay | Admitting: *Deleted

## 2021-04-23 ENCOUNTER — Inpatient Hospital Stay (HOSPITAL_COMMUNITY): Payer: Medicare Other

## 2021-04-23 ENCOUNTER — Encounter: Payer: Self-pay | Admitting: Radiation Oncology

## 2021-04-23 DIAGNOSIS — R29810 Facial weakness: Secondary | ICD-10-CM | POA: Diagnosis present

## 2021-04-23 DIAGNOSIS — Z79899 Other long term (current) drug therapy: Secondary | ICD-10-CM | POA: Diagnosis not present

## 2021-04-23 DIAGNOSIS — I82409 Acute embolism and thrombosis of unspecified deep veins of unspecified lower extremity: Secondary | ICD-10-CM | POA: Diagnosis not present

## 2021-04-23 DIAGNOSIS — R609 Edema, unspecified: Secondary | ICD-10-CM | POA: Diagnosis not present

## 2021-04-23 DIAGNOSIS — Z923 Personal history of irradiation: Secondary | ICD-10-CM | POA: Diagnosis not present

## 2021-04-23 DIAGNOSIS — R262 Difficulty in walking, not elsewhere classified: Secondary | ICD-10-CM | POA: Diagnosis present

## 2021-04-23 DIAGNOSIS — Z9221 Personal history of antineoplastic chemotherapy: Secondary | ICD-10-CM | POA: Diagnosis not present

## 2021-04-23 DIAGNOSIS — Z8249 Family history of ischemic heart disease and other diseases of the circulatory system: Secondary | ICD-10-CM

## 2021-04-23 DIAGNOSIS — Z8673 Personal history of transient ischemic attack (TIA), and cerebral infarction without residual deficits: Secondary | ICD-10-CM

## 2021-04-23 DIAGNOSIS — Z20822 Contact with and (suspected) exposure to covid-19: Secondary | ICD-10-CM | POA: Diagnosis present

## 2021-04-23 DIAGNOSIS — D72828 Other elevated white blood cell count: Secondary | ICD-10-CM | POA: Diagnosis not present

## 2021-04-23 DIAGNOSIS — H409 Unspecified glaucoma: Secondary | ICD-10-CM | POA: Diagnosis present

## 2021-04-23 DIAGNOSIS — Z66 Do not resuscitate: Secondary | ICD-10-CM | POA: Diagnosis not present

## 2021-04-23 DIAGNOSIS — E871 Hypo-osmolality and hyponatremia: Secondary | ICD-10-CM | POA: Diagnosis not present

## 2021-04-23 DIAGNOSIS — G936 Cerebral edema: Secondary | ICD-10-CM | POA: Diagnosis not present

## 2021-04-23 DIAGNOSIS — G8190 Hemiplegia, unspecified affecting unspecified side: Secondary | ICD-10-CM | POA: Diagnosis not present

## 2021-04-23 DIAGNOSIS — D72829 Elevated white blood cell count, unspecified: Secondary | ICD-10-CM | POA: Diagnosis present

## 2021-04-23 DIAGNOSIS — G40109 Localization-related (focal) (partial) symptomatic epilepsy and epileptic syndromes with simple partial seizures, not intractable, without status epilepticus: Secondary | ICD-10-CM | POA: Diagnosis not present

## 2021-04-23 DIAGNOSIS — I10 Essential (primary) hypertension: Secondary | ICD-10-CM | POA: Diagnosis present

## 2021-04-23 DIAGNOSIS — I629 Nontraumatic intracranial hemorrhage, unspecified: Secondary | ICD-10-CM | POA: Diagnosis not present

## 2021-04-23 DIAGNOSIS — A499 Bacterial infection, unspecified: Secondary | ICD-10-CM | POA: Diagnosis not present

## 2021-04-23 DIAGNOSIS — T380X5A Adverse effect of glucocorticoids and synthetic analogues, initial encounter: Secondary | ICD-10-CM | POA: Diagnosis present

## 2021-04-23 DIAGNOSIS — R339 Retention of urine, unspecified: Secondary | ICD-10-CM | POA: Diagnosis not present

## 2021-04-23 DIAGNOSIS — I611 Nontraumatic intracerebral hemorrhage in hemisphere, cortical: Secondary | ICD-10-CM | POA: Diagnosis not present

## 2021-04-23 DIAGNOSIS — R220 Localized swelling, mass and lump, head: Secondary | ICD-10-CM | POA: Diagnosis not present

## 2021-04-23 DIAGNOSIS — R569 Unspecified convulsions: Secondary | ICD-10-CM | POA: Diagnosis not present

## 2021-04-23 DIAGNOSIS — K5901 Slow transit constipation: Secondary | ICD-10-CM | POA: Diagnosis not present

## 2021-04-23 DIAGNOSIS — C4351 Malignant melanoma of anal skin: Secondary | ICD-10-CM | POA: Diagnosis not present

## 2021-04-23 DIAGNOSIS — I619 Nontraumatic intracerebral hemorrhage, unspecified: Secondary | ICD-10-CM | POA: Diagnosis present

## 2021-04-23 DIAGNOSIS — Z85820 Personal history of malignant melanoma of skin: Secondary | ICD-10-CM | POA: Diagnosis not present

## 2021-04-23 DIAGNOSIS — M810 Age-related osteoporosis without current pathological fracture: Secondary | ICD-10-CM | POA: Diagnosis present

## 2021-04-23 DIAGNOSIS — I110 Hypertensive heart disease with heart failure: Secondary | ICD-10-CM | POA: Diagnosis not present

## 2021-04-23 DIAGNOSIS — G8191 Hemiplegia, unspecified affecting right dominant side: Secondary | ICD-10-CM | POA: Diagnosis present

## 2021-04-23 DIAGNOSIS — I480 Paroxysmal atrial fibrillation: Secondary | ICD-10-CM | POA: Diagnosis present

## 2021-04-23 DIAGNOSIS — R4781 Slurred speech: Secondary | ICD-10-CM | POA: Diagnosis present

## 2021-04-23 DIAGNOSIS — Z803 Family history of malignant neoplasm of breast: Secondary | ICD-10-CM | POA: Diagnosis not present

## 2021-04-23 DIAGNOSIS — C7931 Secondary malignant neoplasm of brain: Principal | ICD-10-CM

## 2021-04-23 DIAGNOSIS — C439 Malignant melanoma of skin, unspecified: Secondary | ICD-10-CM | POA: Diagnosis not present

## 2021-04-23 DIAGNOSIS — E7800 Pure hypercholesterolemia, unspecified: Secondary | ICD-10-CM | POA: Diagnosis not present

## 2021-04-23 DIAGNOSIS — I6381 Other cerebral infarction due to occlusion or stenosis of small artery: Secondary | ICD-10-CM | POA: Diagnosis not present

## 2021-04-23 DIAGNOSIS — C211 Malignant neoplasm of anal canal: Secondary | ICD-10-CM | POA: Diagnosis not present

## 2021-04-23 DIAGNOSIS — N390 Urinary tract infection, site not specified: Secondary | ICD-10-CM | POA: Diagnosis not present

## 2021-04-23 DIAGNOSIS — C801 Malignant (primary) neoplasm, unspecified: Secondary | ICD-10-CM | POA: Diagnosis not present

## 2021-04-23 DIAGNOSIS — I5030 Unspecified diastolic (congestive) heart failure: Secondary | ICD-10-CM | POA: Diagnosis not present

## 2021-04-23 DIAGNOSIS — R531 Weakness: Secondary | ICD-10-CM | POA: Diagnosis not present

## 2021-04-23 DIAGNOSIS — G9389 Other specified disorders of brain: Secondary | ICD-10-CM | POA: Diagnosis not present

## 2021-04-23 DIAGNOSIS — R0902 Hypoxemia: Secondary | ICD-10-CM | POA: Diagnosis not present

## 2021-04-23 DIAGNOSIS — Z87891 Personal history of nicotine dependence: Secondary | ICD-10-CM

## 2021-04-23 DIAGNOSIS — D62 Acute posthemorrhagic anemia: Secondary | ICD-10-CM | POA: Diagnosis not present

## 2021-04-23 DIAGNOSIS — Z853 Personal history of malignant neoplasm of breast: Secondary | ICD-10-CM

## 2021-04-23 LAB — RESP PANEL BY RT-PCR (FLU A&B, COVID) ARPGX2
Influenza A by PCR: NEGATIVE
Influenza B by PCR: NEGATIVE
SARS Coronavirus 2 by RT PCR: NEGATIVE

## 2021-04-23 LAB — COMPREHENSIVE METABOLIC PANEL
ALT: 42 U/L (ref 0–44)
AST: 24 U/L (ref 15–41)
Albumin: 3.8 g/dL (ref 3.5–5.0)
Alkaline Phosphatase: 61 U/L (ref 38–126)
Anion gap: 9 (ref 5–15)
BUN: 25 mg/dL — ABNORMAL HIGH (ref 8–23)
CO2: 28 mmol/L (ref 22–32)
Calcium: 9 mg/dL (ref 8.9–10.3)
Chloride: 98 mmol/L (ref 98–111)
Creatinine, Ser: 0.62 mg/dL (ref 0.44–1.00)
GFR, Estimated: >60 mL/min (ref >60)
Glucose, Bld: 105 mg/dL — ABNORMAL HIGH (ref 70–99)
Potassium: 3.4 mmol/L — ABNORMAL LOW (ref 3.5–5.1)
Sodium: 135 mmol/L (ref 135–145)
Total Bilirubin: 1 mg/dL (ref 0.3–1.2)
Total Protein: 6.5 g/dL (ref 6.5–8.1)

## 2021-04-23 LAB — CBC WITH DIFFERENTIAL/PLATELET
Abs Immature Granulocytes: 0.57 10*3/uL — ABNORMAL HIGH (ref 0.00–0.07)
Basophils Absolute: 0.1 10*3/uL (ref 0.0–0.1)
Basophils Relative: 1 %
Eosinophils Absolute: 0.1 10*3/uL (ref 0.0–0.5)
Eosinophils Relative: 0 %
HCT: 47.3 % — ABNORMAL HIGH (ref 36.0–46.0)
Hemoglobin: 15.4 g/dL — ABNORMAL HIGH (ref 12.0–15.0)
Immature Granulocytes: 3 %
Lymphocytes Relative: 12 %
Lymphs Abs: 2.6 10*3/uL (ref 0.7–4.0)
MCH: 31.7 pg (ref 26.0–34.0)
MCHC: 32.6 g/dL (ref 30.0–36.0)
MCV: 97.3 fL (ref 80.0–100.0)
Monocytes Absolute: 1.5 10*3/uL — ABNORMAL HIGH (ref 0.1–1.0)
Monocytes Relative: 7 %
Neutro Abs: 17.3 10*3/uL — ABNORMAL HIGH (ref 1.7–7.7)
Neutrophils Relative %: 77 %
Platelets: 282 10*3/uL (ref 150–400)
RBC: 4.86 MIL/uL (ref 3.87–5.11)
RDW: 14.4 % (ref 11.5–15.5)
WBC: 22.1 10*3/uL — ABNORMAL HIGH (ref 4.0–10.5)
nRBC: 0 % (ref 0.0–0.2)

## 2021-04-23 LAB — PROTIME-INR
INR: 1 (ref 0.8–1.2)
Prothrombin Time: 12.7 seconds (ref 11.4–15.2)

## 2021-04-23 LAB — PHOSPHORUS: Phosphorus: 3.3 mg/dL (ref 2.5–4.6)

## 2021-04-23 LAB — MAGNESIUM: Magnesium: 2.4 mg/dL (ref 1.7–2.4)

## 2021-04-23 IMAGING — MR MR HEAD WO/W CM
14 of 16 series · 40 of 48 positions shown · IV contrast (Gadavist)
Comparison: MRI [DATE].

CLINICAL DATA: Brain/CNS surveillance.

EXAM:
MRI HEAD WITHOUT AND WITH CONTRAST
TECHNIQUE: Multiplanar, multiecho pulse sequences of the brain and surrounding
structures were obtained without and with intravenous contrast.
CONTRAST:  6mL GADAVIST GADOBUTROL 1 MMOL/ML IV SOLN

[Series 5: DWI · axial · 3.0mm · 0.88mm/px · z∈[-121,+15]mm · 6 of 96 slices shown (1 of 4)]
[im 1/96]
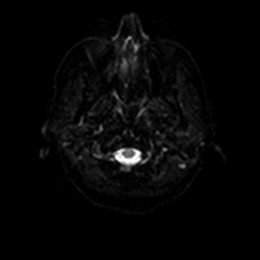
[im 20/96]
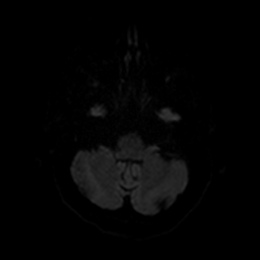
[im 39/96]
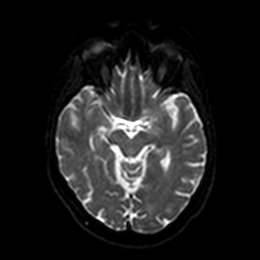
[im 58/96]
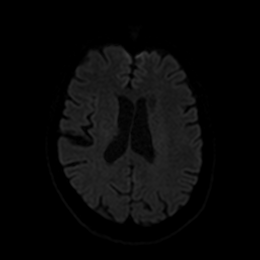
[im 77/96]
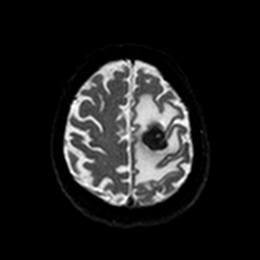
[im 96/96]
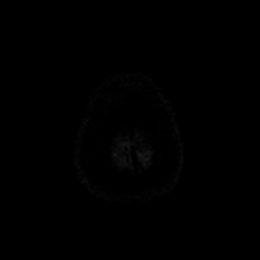

[Series 6: DWI · axial · 3.0mm · 0.88mm/px · z∈[-121,+15]mm · 3 of 48 slices shown (2 of 4)]
[im 1/48]
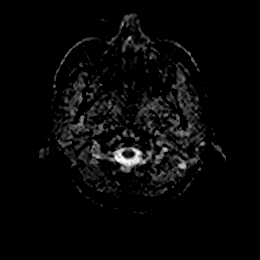
[im 24/48]
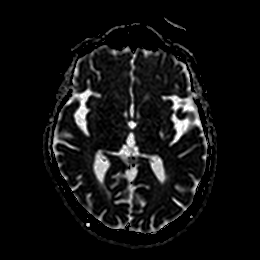
[im 48/48]
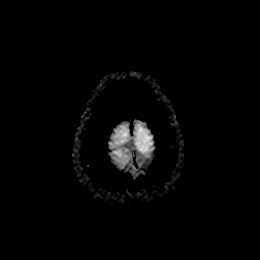

[Series 7: DWI · coronal · 4.0mm · 0.88mm/px · 4 of 64 slices shown (3 of 4)]
[im 1/64]
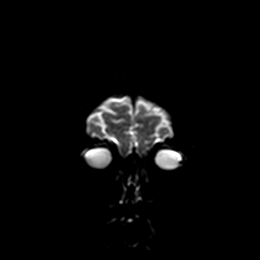
[im 22/64]
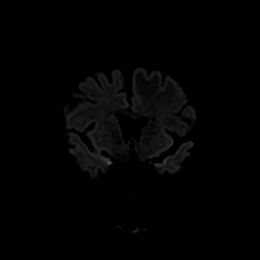
[im 43/64]
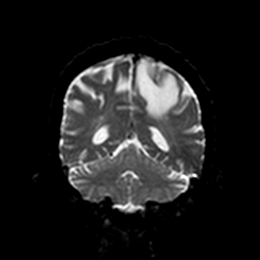
[im 64/64]
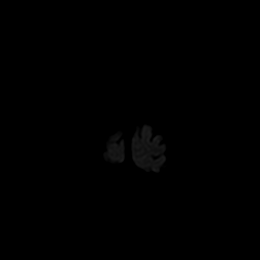

[Series 8: DWI · coronal · 4.0mm · 0.88mm/px · 2 of 32 slices shown (4 of 4)]
[im 1/32]
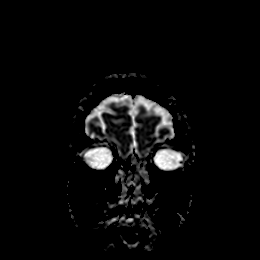
[im 32/32]
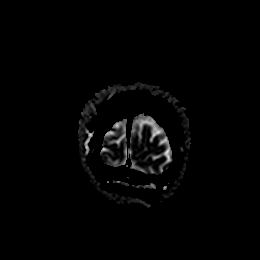

[Series 9: T1 · sagittal · 5.0mm · 0.75mm/px · 1 of 23 slices shown]
[im 1/23]
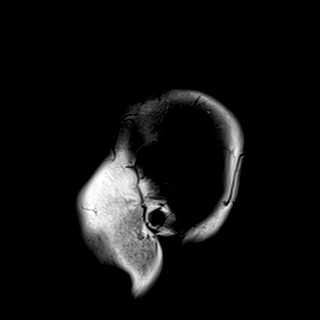

[Series 10: T2 · axial · 5.0mm · 0.72mm/px · z∈[-121,+18]mm · 2 of 25 slices shown]
[im 1/25]
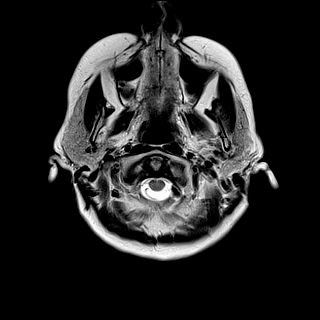
[im 25/25]
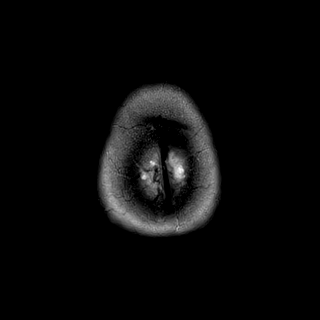

[Series 11: FLAIR · axial · 5.0mm · 0.45mm/px · z∈[-120,+19]mm · 2 of 25 slices shown]
[im 1/25]
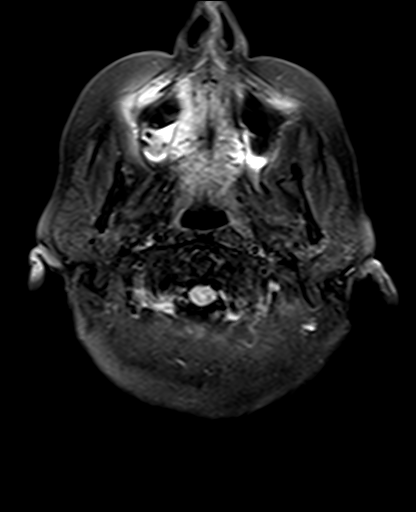
[im 25/25]
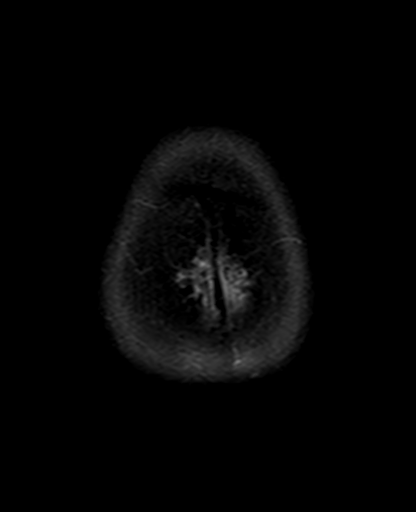

[Series 12: mag_images · axial · 3.0mm · 0.90mm/px · z∈[-131,+40]mm · 4 of 60 slices shown]
[im 1/60]
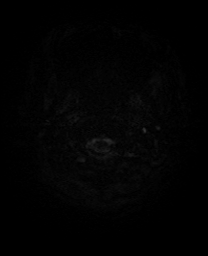
[im 20/60]
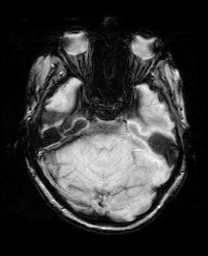
[im 40/60]
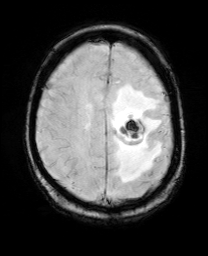
[im 60/60]
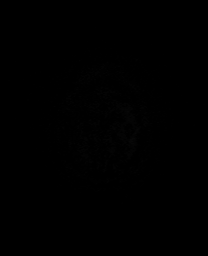

[Series 13: pha_images · axial · 3.0mm · 0.90mm/px · z∈[-128,+40]mm · 4 of 58 slices shown]
[im 1/58]
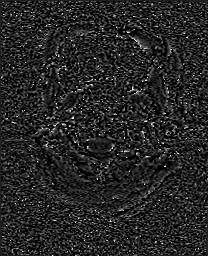
[im 20/58]
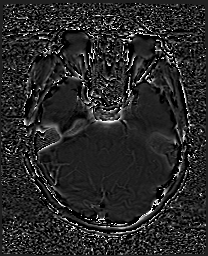
[im 39/58]
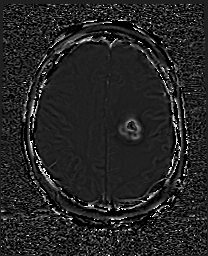
[im 58/58]
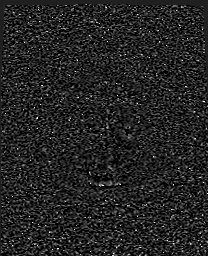

[Series 14: swi_images · axial · 3.0mm · 0.90mm/px · z∈[-131,+40]mm · 4 of 60 slices shown]
[im 1/60]
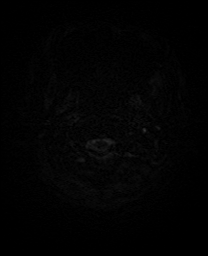
[im 20/60]
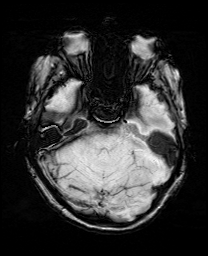
[im 40/60]
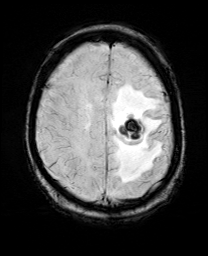
[im 60/60]
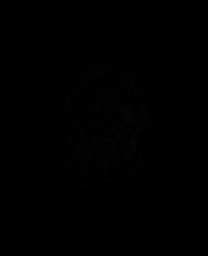

[Series 15: mip_images(sw) · axial · 24.0mm · 0.90mm/px · z∈[-121,+29]mm · 3 of 53 slices shown]
[im 1/53]
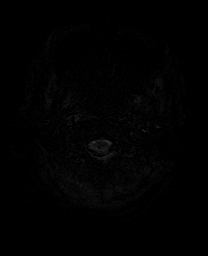
[im 27/53]
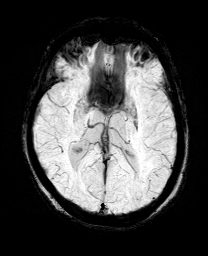
[im 53/53]
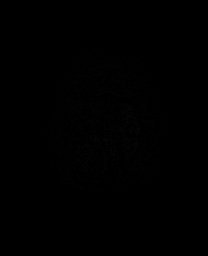

[Series 17: T2 post-contrast · coronal · 5.0mm · 0.72mm/px · 2 of 28 slices shown]
[im 1/28]
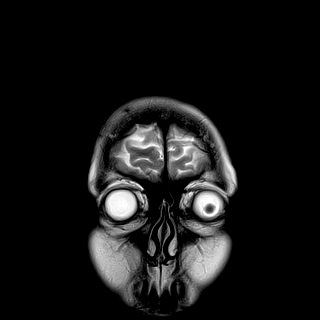
[im 28/28]
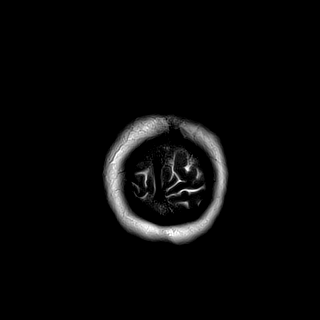

[Series 19: T1 post-contrast · coronal · 5.0mm · 0.34mm/px · 2 of 28 slices shown (1 of 2)]
[im 1/28]
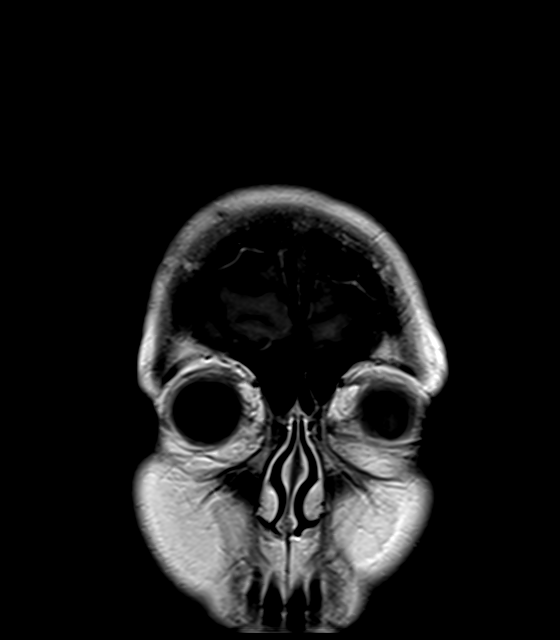
[im 28/28]
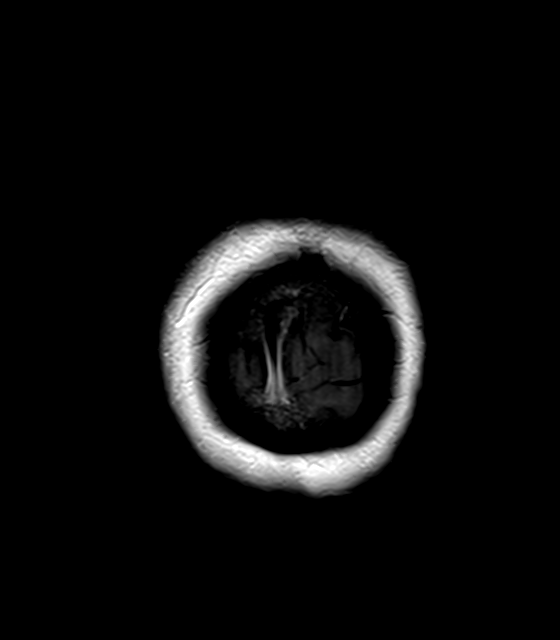

[Series 20: T1 post-contrast · sagittal · 5.0mm · 0.72mm/px · 1 of 23 slices shown (2 of 2)]
[im 1/23]
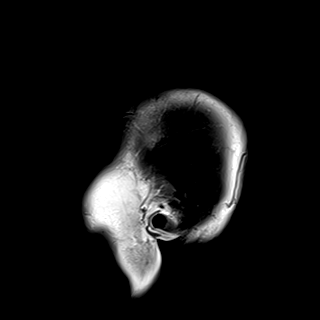

[40 of 48 positions shown; findings below may reference images not displayed]

FINDINGS: Brain: Significant interval increase in size of the superior left
frontal intraparenchymal metastasis, which now measures 3.1 x 2.6 x
3.0 cm (previously 1.5 x 1.4 x 1.8 cm when remeasured). A portion of
the lesion is intrinsically T1 hyperintense, compatible with
hemorrhage. The lesion demonstrates predominantly peripheral
enhancement. Increased surrounding vasogenic edema in the frontal
and parietal lobes with associated regional mass effect. No midline
shift. Basal cisterns are patent. No acute infarct. No
hydrocephalus. No extra-axial fluid collection. No acute hemorrhage
outside of the metastatic lesion.

Vascular: Major arterial flow voids are maintained at the skull
base.

Skull and upper cervical spine: Normal marrow signal.

Sinuses/Orbits: Mild ethmoid air cell mucosal thickening.
Unremarkable orbits.

Other: No sizable mastoid effusions.
IMPRESSION: 1. Significant interval increase in the size of a hemorrhagic
superior left frontal lobe intraparenchymal metastasis that measures
3.1 x 2.6 x 3.0 cm (previously 1.5 x 1.4 x 1.8 cm when remeasured).
Increased surrounding vasogenic edema with local mass effect. No
midline shift.
2. No new lesions identified.

## 2021-04-23 IMAGING — CT CT HEAD W/O CM
3 series · 15 of 47 positions shown, 18 images · non-contrast
Comparison: Multiple exams, including CT head [DATE] and MRI
brain [DATE].

CLINICAL DATA: Right-sided weakness progressively worsening over
the last 2 weeks. Personal history of melanoma metastatic to the
brain.

EXAM:
CT HEAD WITHOUT CONTRAST
TECHNIQUE: Contiguous axial images were obtained from the base of the skull
through the vertex without intravenous contrast.

[Series 4: head 5.0 h30s · axial · 0.46mm/px · z∈[-101,+34]mm · 9 of 33 slices shown, 12 images]
[im 3/33  brain]
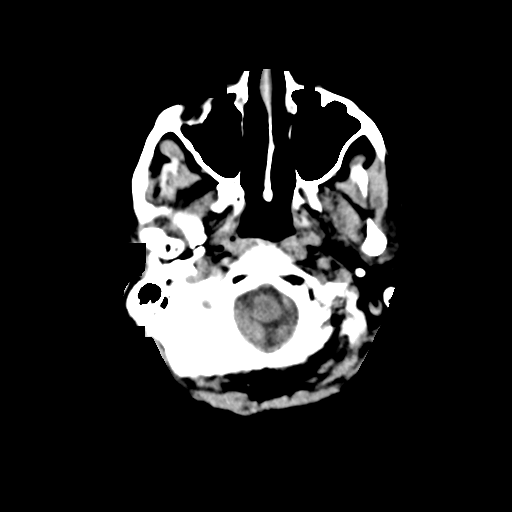
[im 3/33  bone]
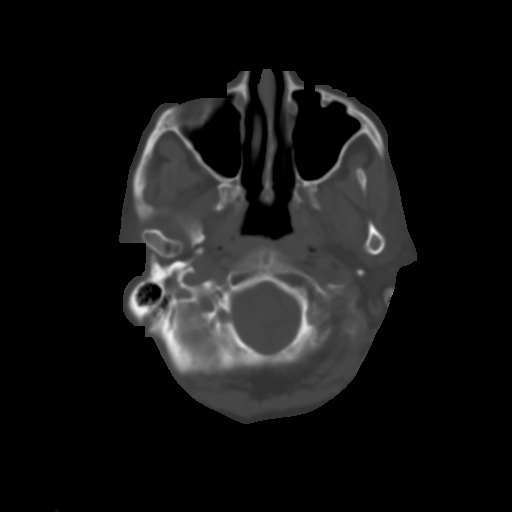
[im 6/33  brain]
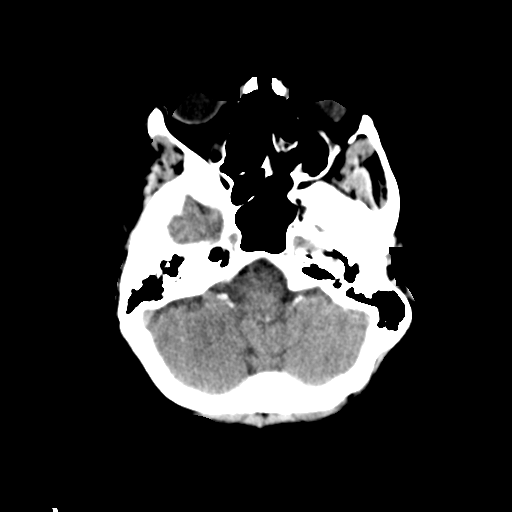
[im 9/33  brain]
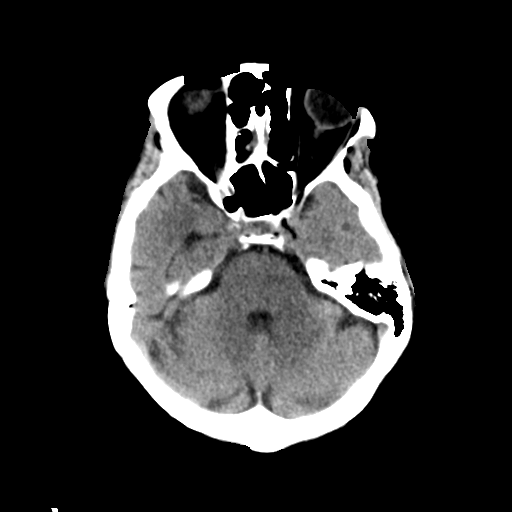
[im 13/33  brain]
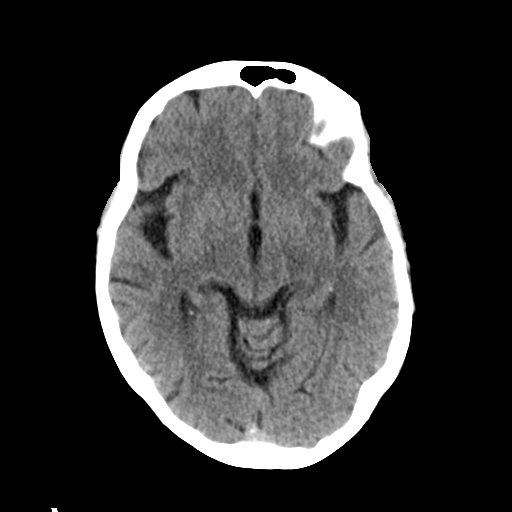
[im 17/33  brain]
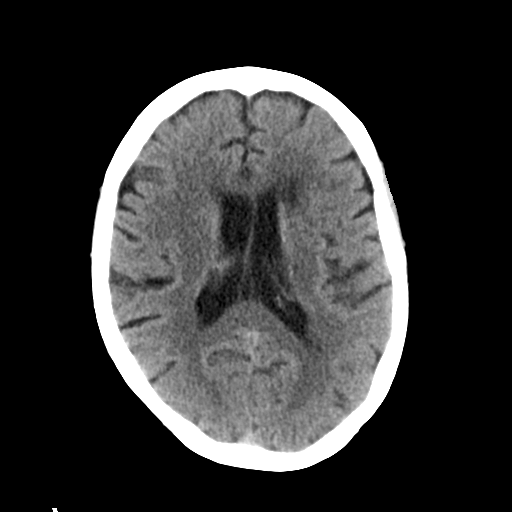
[im 17/33  bone]
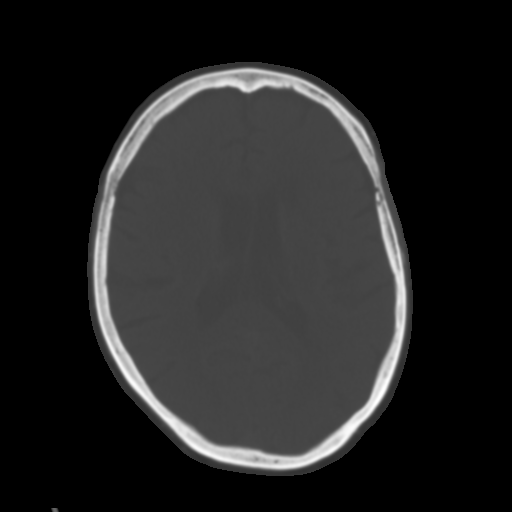
[im 20/33  brain]
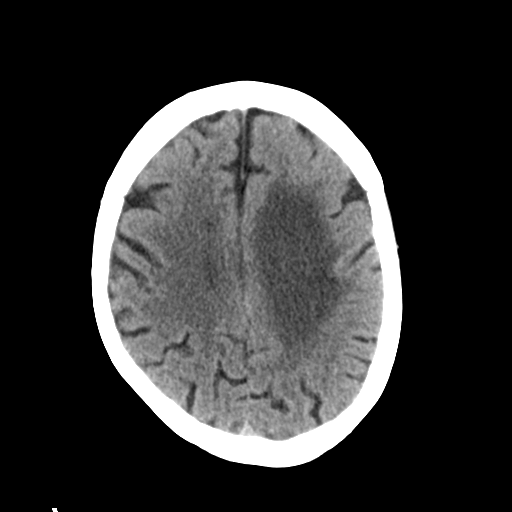
[im 24/33  brain]
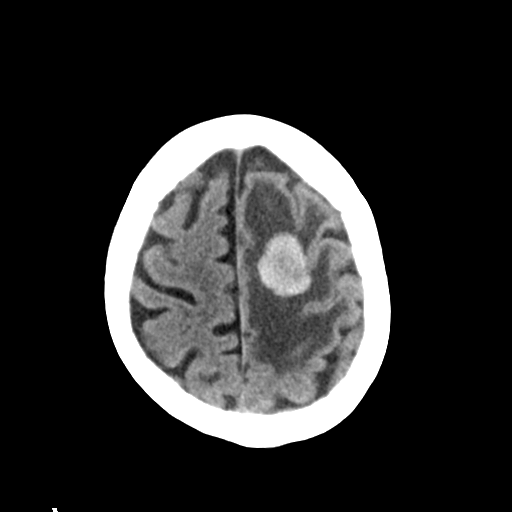
[im 27/33  brain]
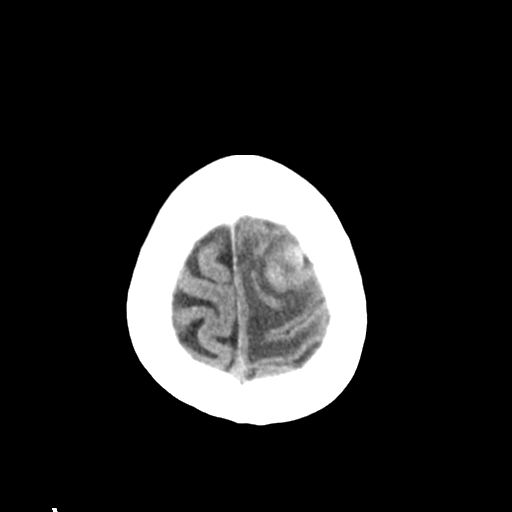
[im 30/33  brain]
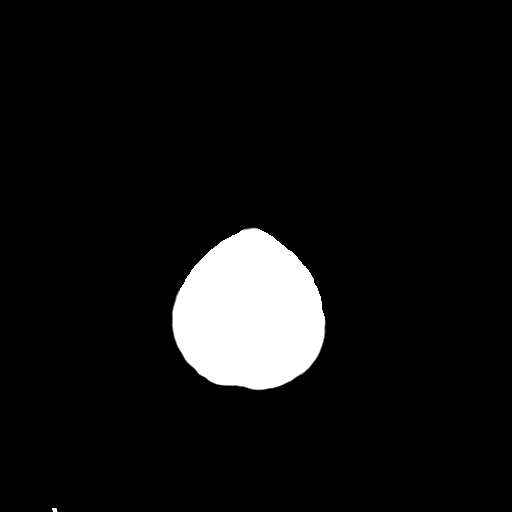
[im 30/33  bone]
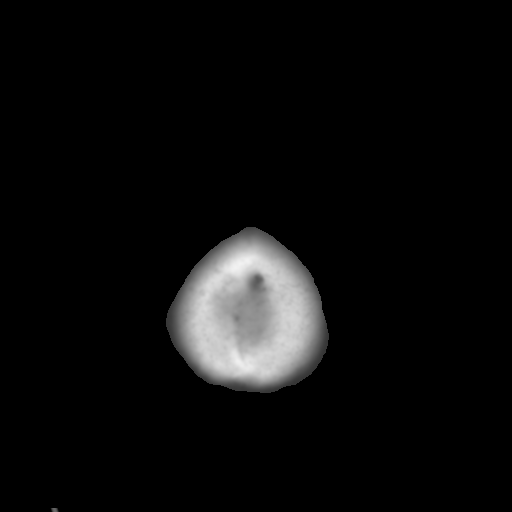

[Series 5: head 3.0 mpr cor · coronal · 0.31mm/px · 3 of 67 slices shown]
[im 23/67  brain]
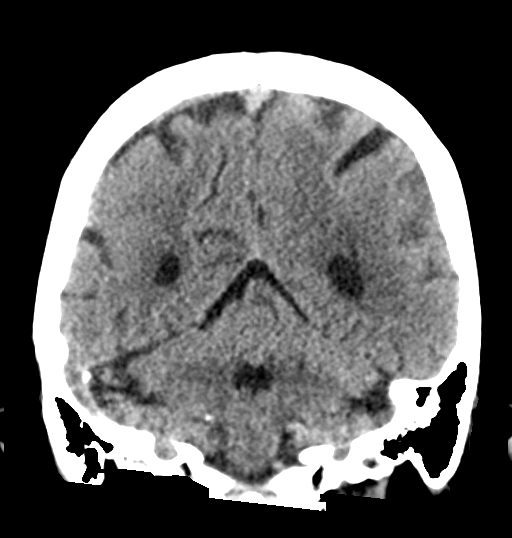
[im 30/67  brain]
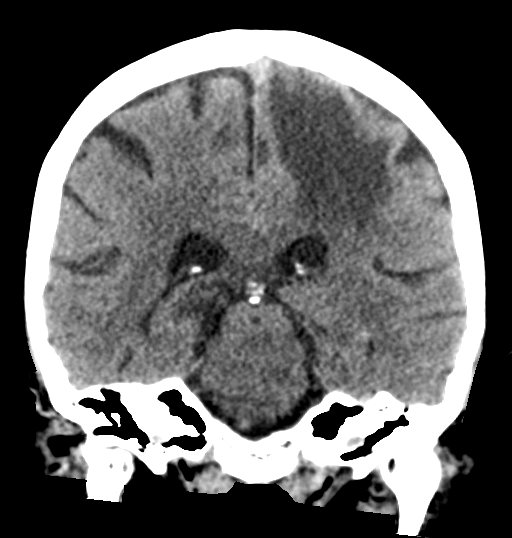
[im 37/67  brain]
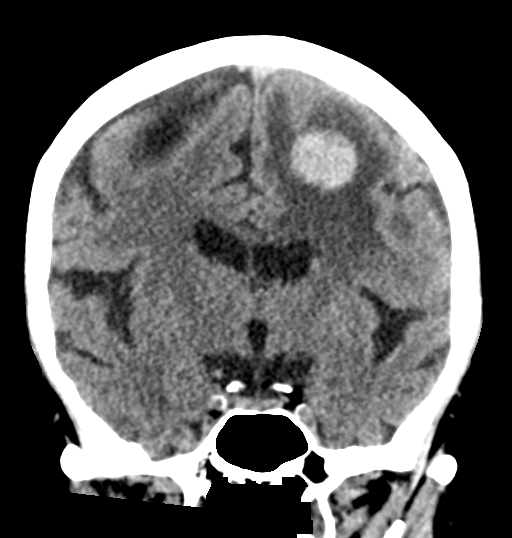

[Series 6: head 3.0 mpr sag · sagittal · 0.33mm/px · 3 of 55 slices shown]
[im 20/55  brain]
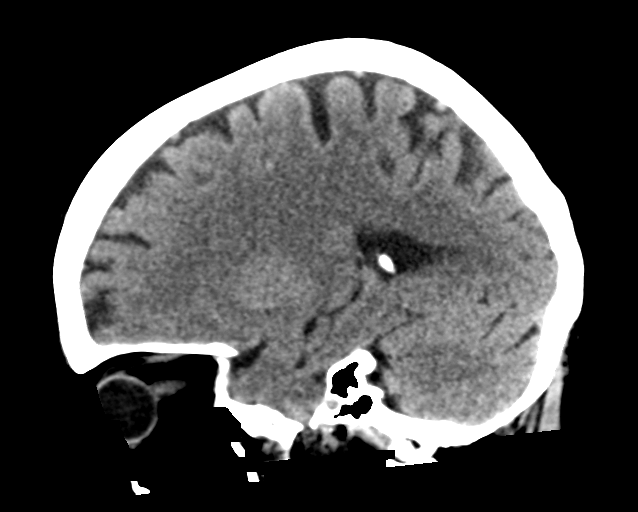
[im 28/55  brain]
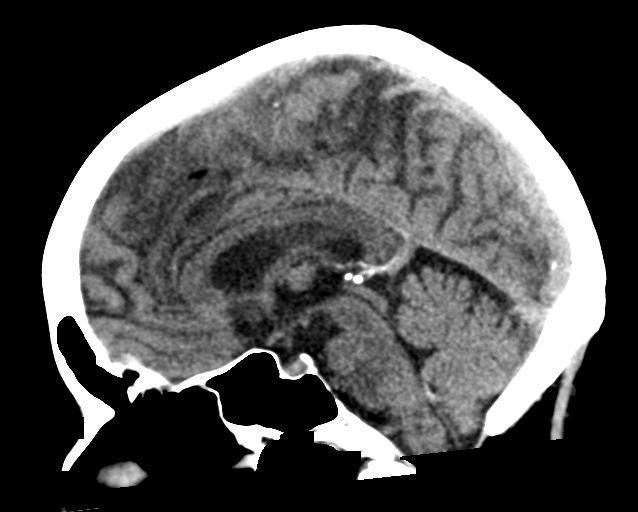
[im 36/55  brain]
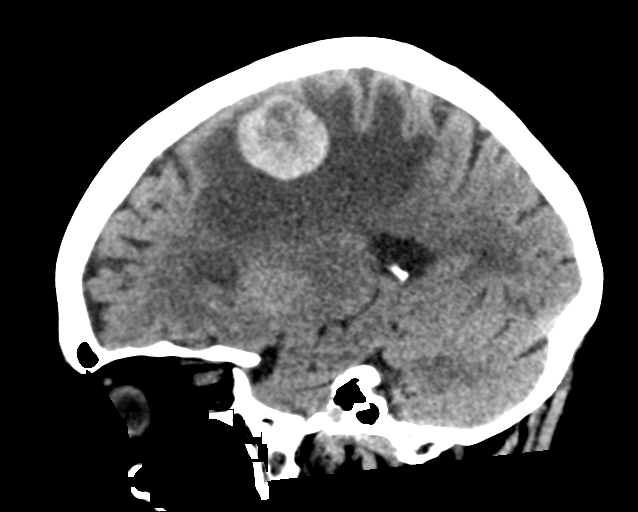

[15 of 47 positions shown; findings below may reference images not displayed]

FINDINGS: Brain: Left frontal lobe metastatic lesion near the vertex measures
2.9 by 2.3 cm on image 25 series 4, previously 2.4 by 1.2 cm on
[DATE] and previously 1.6 by 1.6 cm on [DATE]. The lesion
continues to be high in density, as on [DATE], probably
reflecting some associated petechial hemorrhage. Substantial
surrounding vasogenic edema. Mild effacement of adjacent sulci.

Small remote infarct in the left frontal periventricular white
matter. Periventricular white matter and corona radiata
hypodensities favor chronic ischemic microvascular white matter
disease. Dilated perivascular space or remote small lacunar infarct
posteriorly along the left lentiform nucleus margin. I not see any
new metastatic lesions.

Vascular: Unremarkable

Skull: Unremarkable

Sinuses/Orbits: Chronic sinusitis posteriorly in the left ethmoid
air cells.

Other: No supplemental non-categorized findings.
IMPRESSION: 1. Enlarging dense left frontal lobe mass at the vertex, currently
2.9 by 2.3 cm, most compatible with enlarging metastatic lesion.
Worsening surrounding vasogenic edema.
2. Mild chronic left ethmoid sinusitis.
3. Periventricular white matter and corona radiata hypodensities
favor chronic ischemic microvascular white matter disease. Small
remote lacunar infarcts in the left periventricular white matter and
along the posterior margin of the left lentiform nucleus.

## 2021-04-23 IMAGING — CT CT HEAD W/O CM
3 series · 14 of 47 positions shown, 16 images · non-contrast
Comparison: Head CT and brain MRI earlier today.

CLINICAL DATA: Follow-up parenchymal hemorrhage.

EXAM:
CT HEAD WITHOUT CONTRAST
TECHNIQUE: Contiguous axial images were obtained from the base of the skull
through the vertex without intravenous contrast.

[Series 3: head 5.0 h30s · axial · 0.50mm/px · z∈[+1081,+1221]mm · 8 of 34 slices shown, 10 images]
[im 3/34  brain]
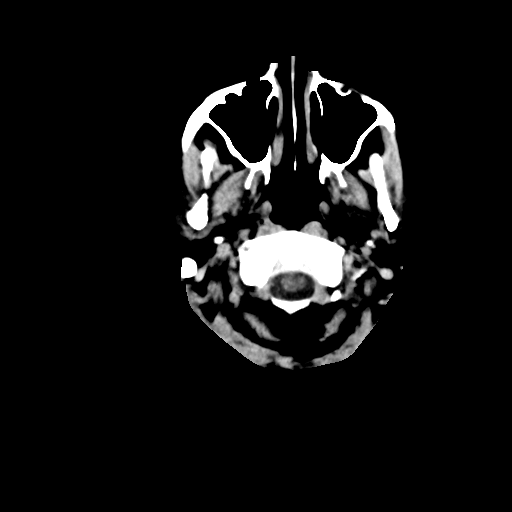
[im 3/34  bone]
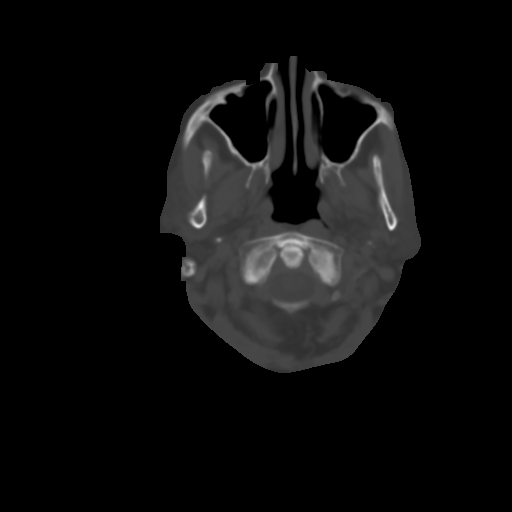
[im 7/34  brain]
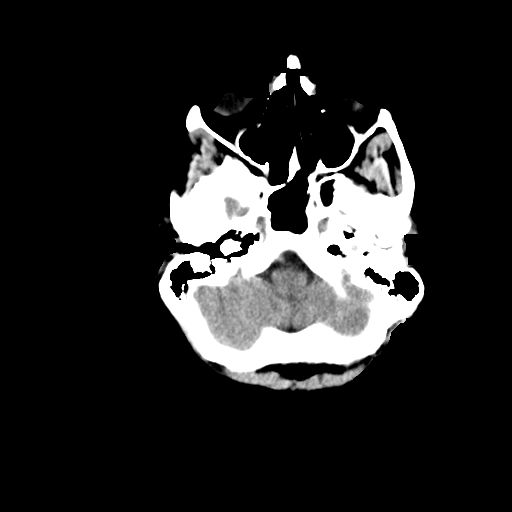
[im 11/34  brain]
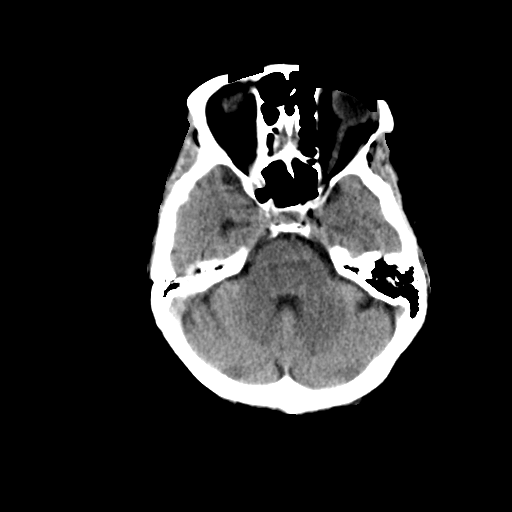
[im 15/34  brain]
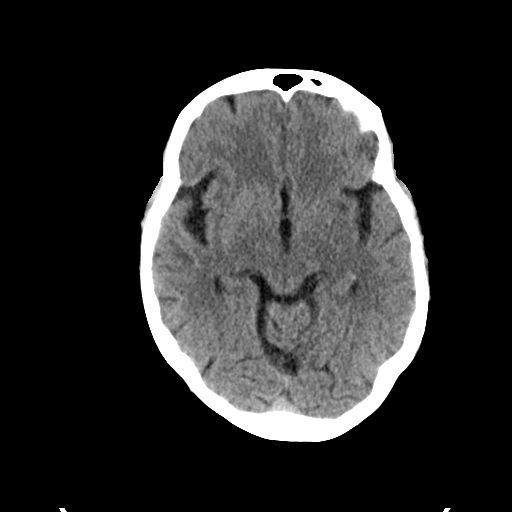
[im 19/34  brain]
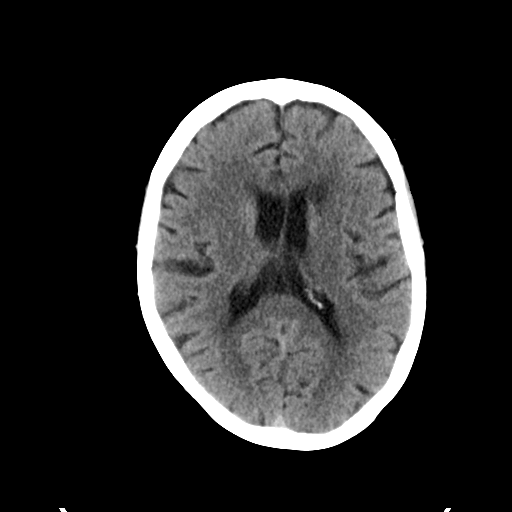
[im 19/34  bone]
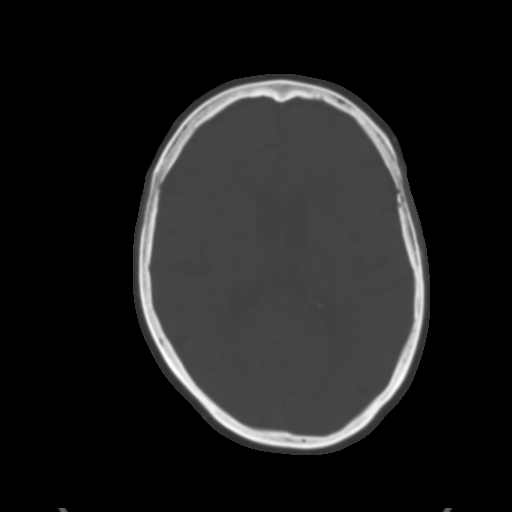
[im 23/34  brain]
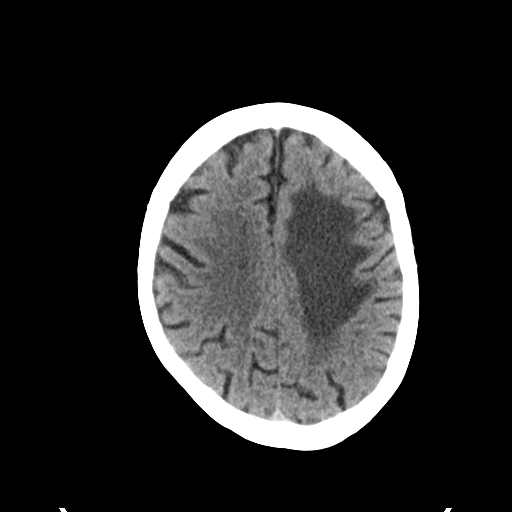
[im 27/34  brain]
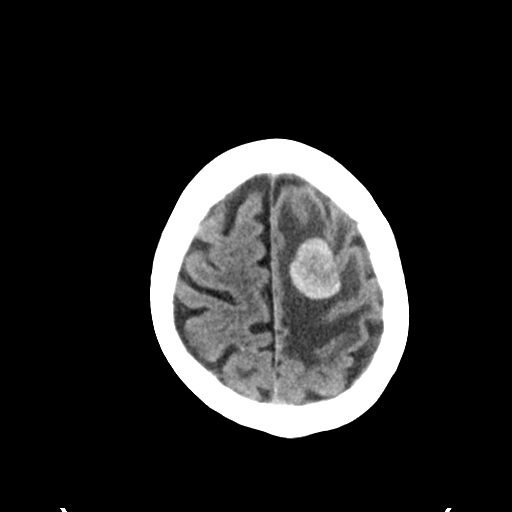
[im 31/34  brain]
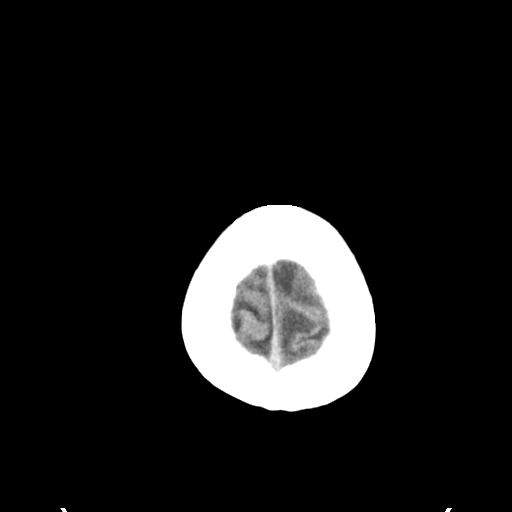

[Series 5: head 3.0 mpr cor · coronal · 0.31mm/px · 3 of 71 slices shown]
[im 24/71  brain]
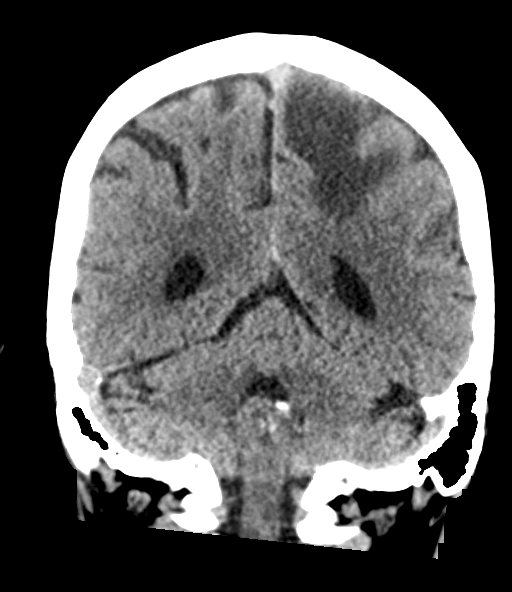
[im 32/71  brain]
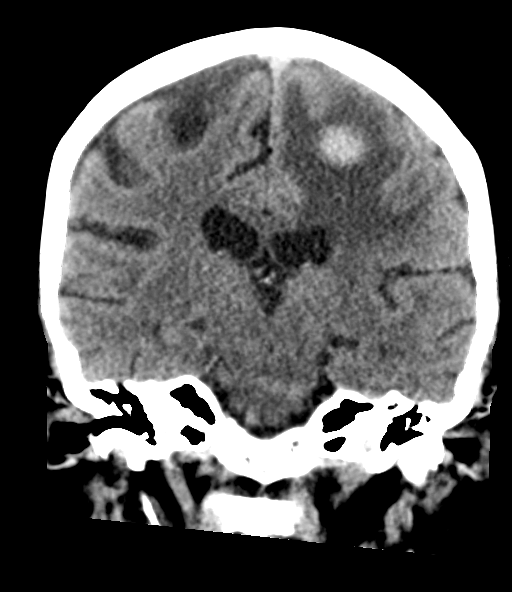
[im 39/71  brain]
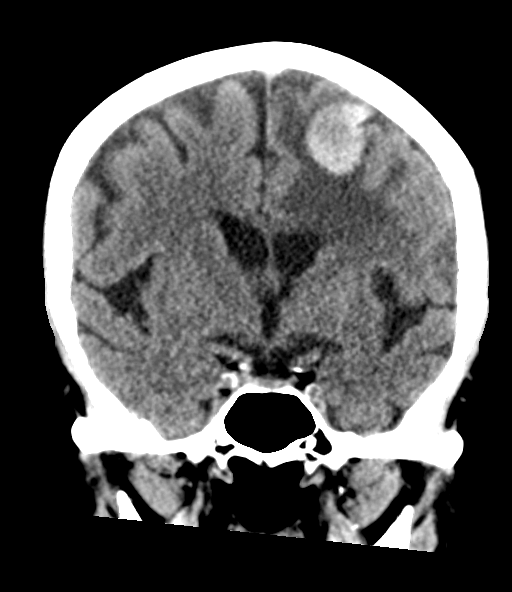

[Series 6: head 3.0 mpr sag · sagittal · 0.35mm/px · 3 of 67 slices shown]
[im 23/67  brain]
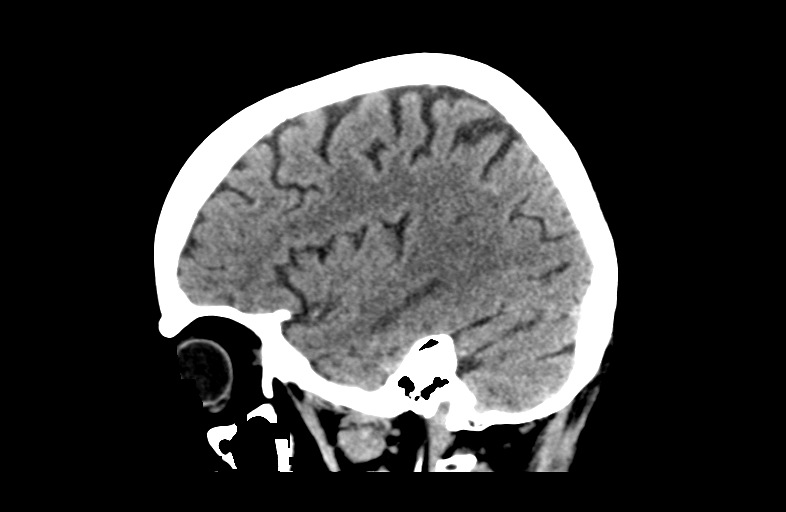
[im 34/67  brain]
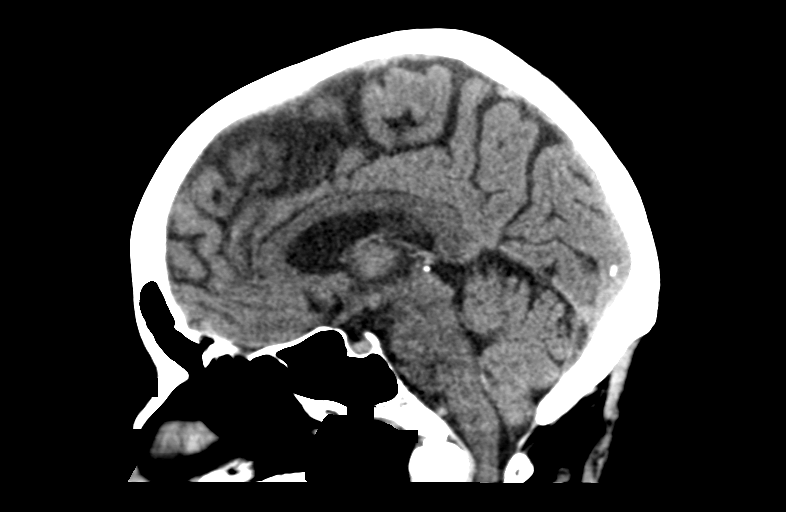
[im 44/67  brain]
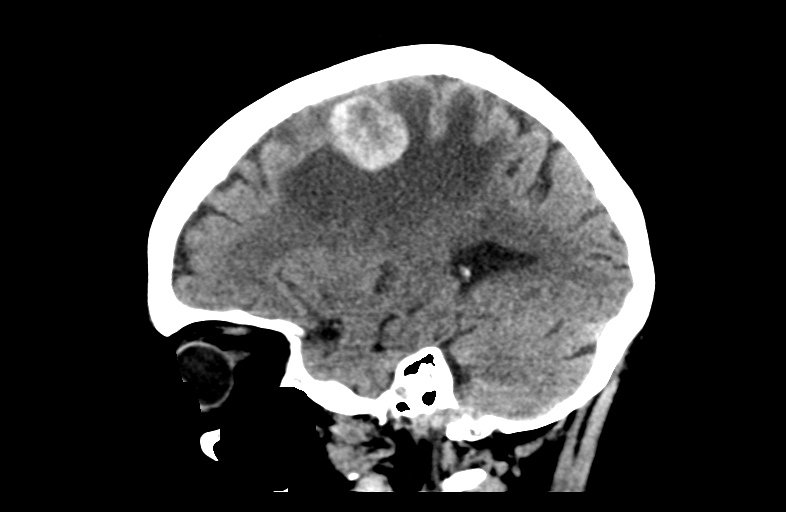

[14 of 47 positions shown; findings below may reference images not displayed]

FINDINGS: Brain: No significant change in the hemorrhagic left frontal lobe
lesion measuring 2.9 x 2.3 cm. Stable degree of surrounding
vasogenic edema with localized mass effect, including the frontal
horn of the left lateral ventricle. There is otherwise no
significant change from exams earlier today. No evidence of new
lesion or hemorrhage. No subdural collection. No hydrocephalus.

Vascular: No hyperdense vessel.

Skull: No fracture or focal lesion.

Sinuses/Orbits: Stable lung mucosal thickening and opacification of
scattered left ethmoid air cells. Right cataract resection.

Other: No mastoid effusion.
IMPRESSION: 1. No significant change in hemorrhagic left frontal lobe lesion
with surrounding vasogenic edema and localized mass effect.
2. No new abnormality from exams earlier today.

## 2021-04-23 MED ORDER — ATORVASTATIN CALCIUM 10 MG PO TABS
10.0000 mg | ORAL_TABLET | Freq: Every day | ORAL | Status: DC
  Administered 2021-04-23 – 2021-05-03 (×11): 10 mg via ORAL
  Filled 2021-04-23 (×12): qty 1

## 2021-04-23 MED ORDER — METOPROLOL SUCCINATE ER 25 MG PO TB24
25.0000 mg | ORAL_TABLET | Freq: Two times a day (BID) | ORAL | Status: DC
  Administered 2021-04-23 – 2021-05-03 (×21): 25 mg via ORAL
  Filled 2021-04-23 (×21): qty 1

## 2021-04-23 MED ORDER — LEVETIRACETAM 500 MG PO TABS
500.0000 mg | ORAL_TABLET | Freq: Two times a day (BID) | ORAL | Status: DC
  Administered 2021-04-23 – 2021-04-30 (×14): 500 mg via ORAL
  Filled 2021-04-23 (×17): qty 1

## 2021-04-23 MED ORDER — GADOBUTROL 1 MMOL/ML IV SOLN
6.0000 mL | Freq: Once | INTRAVENOUS | Status: AC | PRN
  Administered 2021-04-23: 6 mL via INTRAVENOUS

## 2021-04-23 MED ORDER — SENNOSIDES-DOCUSATE SODIUM 8.6-50 MG PO TABS
1.0000 | ORAL_TABLET | Freq: Two times a day (BID) | ORAL | Status: DC
  Administered 2021-04-23 – 2021-05-03 (×15): 1 via ORAL
  Filled 2021-04-23 (×21): qty 1

## 2021-04-23 MED ORDER — ACETAMINOPHEN 160 MG/5ML PO SOLN: 650.0000 mg | ORAL | Status: DC | PRN

## 2021-04-23 MED ORDER — CLEVIDIPINE BUTYRATE 0.5 MG/ML IV EMUL: 0.0000 mg/h | INTRAVENOUS | Status: DC

## 2021-04-23 MED ORDER — RALOXIFENE HCL 60 MG PO TABS
60.0000 mg | ORAL_TABLET | Freq: Every morning | ORAL | Status: DC
  Administered 2021-04-24 – 2021-04-27 (×4): 60 mg via ORAL
  Filled 2021-04-23 (×6): qty 1

## 2021-04-23 MED ORDER — STROKE: EARLY STAGES OF RECOVERY BOOK
Freq: Once | Status: DC
  Filled 2021-04-23: qty 1

## 2021-04-23 MED ORDER — ACETAMINOPHEN 650 MG RE SUPP: 650.0000 mg | RECTAL | Status: DC | PRN

## 2021-04-23 MED ORDER — DEXAMETHASONE SODIUM PHOSPHATE 10 MG/ML IJ SOLN
10.0000 mg | Freq: Once | INTRAMUSCULAR | Status: AC
  Administered 2021-04-23: 10 mg via INTRAVENOUS
  Filled 2021-04-23: qty 1

## 2021-04-23 MED ORDER — ACETAMINOPHEN 325 MG PO TABS
650.0000 mg | ORAL_TABLET | ORAL | Status: DC | PRN
  Administered 2021-05-02 – 2021-05-03 (×2): 650 mg via ORAL
  Filled 2021-04-23 (×2): qty 2

## 2021-04-23 MED ORDER — PANTOPRAZOLE SODIUM 40 MG IV SOLR
40.0000 mg | Freq: Every day | INTRAVENOUS | Status: DC
  Administered 2021-04-24 – 2021-04-25 (×2): 40 mg via INTRAVENOUS
  Filled 2021-04-23 (×3): qty 40

## 2021-04-23 MED ORDER — DEXAMETHASONE 4 MG PO TABS
8.0000 mg | ORAL_TABLET | Freq: Two times a day (BID) | ORAL | Status: DC
  Administered 2021-04-23 – 2021-05-03 (×20): 8 mg via ORAL
  Filled 2021-04-23 (×23): qty 2

## 2021-04-23 NOTE — ED Notes (Signed)
No changes, alert, NAD, calm, interactive, passed swallow screen, pt taken to MRI. Son at [facility].

## 2021-04-23 NOTE — Progress Notes (Signed)
Neurology Brief Note  Patient seen by me in ED for acute worsening of R sided weakness over the weekend. 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 She was on decadron taper but does not remember her dose or when she last decreased it. She is f/b Dr. Mickeal Skinner and Dr. Alen Blew as o/p. CT head today showed marked enlargement of hemorrhagic met and surrounding vasogenic edema. She has always had hyperintensity on head CT 2/2 petechial hemorrhage there but given her rapid worsening over the weekend and appearance on personal review of the imaging I am concerned that her worsening is due at least in part to further hemorrhage into the met.  Interim recommendations:  - Admit to ICU under Dr. Quinn Axe - Clevidipine for goal SBP <160 - SCDs for DVT prophylaxis - MRI wwo contrast brain - Head CT 6 hrs after MRI assess stability of hemorrhage - 63m IV decadron now - No AC or ASA, SCDs for DVT prophylaxis - Continue home prophylactic keppra - HOB elevated 30 degrees - Bedrest x24 hrs - I will consult oncology as well. I [place] in given potential worsening of hemorrhage in setting of [place]  Full H&P to follow.  CSu Monks MD [facility] [phone] If 7pm- 7am, please page neurology on call as listed in [place]

## 2021-04-23 NOTE — ED Notes (Signed)
Pt to CT, alert, NAD, calm, no changes. 

## 2021-04-23 NOTE — Telephone Encounter (Signed)
Patient called to report being unable to move entire right side all weekend long.  She reports difficulty expressing her thoughts verbally and unable to complete a sentence.  She was on a decadron taper and she feels like before she dropped down from 4mg  daily to 3 mg daily she noticed a subtle change.  After having difficult weekend she increased back to 4 mg from 3 mg but didn't notice much improvement.  Her brother is with her currently helping to faciliate the call.  Reviewed concerns with dr Mickeal Skinner and he was agreeable to increase back to Decadron 4 mg daily but advised that she needs to go to the ED for scans.    Explained to patient.  No further questions at this time.

## 2021-04-23 NOTE — ED Triage Notes (Signed)
BIB [facility] from home, here for R sided weakness gradually progressively worse over last 2 weeks while weaning steroid taken for brain cancer. Flaccid RUE/RLE this am. Some trouble swallowing. Had Sz 12/11/20, stopped eliquis same day.A&Ox4.

## 2021-04-23 NOTE — ED Notes (Signed)
EDP at [facility] 

## 2021-04-23 NOTE — Progress Notes (Signed)
  Radiation Oncology         [phone]) [phone] ________________________________  Name: Angela Carpenter MRN: [id number]  Date: 04/23/2021  DOB: April 11, 1941  Chart Note:  I was notified by neurology that this patient is being admitted to the hospital based on this CT scan.  Patient is weak on the right side with pronounced edema surrounding this left brain lesion with potential hemorrhage.  Neurology is going to increase dexamethasone and hold Keytruda, pending oncology input.  I have asked that neurosurgery review imaging to assess for whether there may be a role for surgery.  MRI of the brain is pending.  Radiographic Findings: CT Head Wo Contrast  Result Date: 04/23/2021 CLINICAL DATA:  Right-sided weakness progressively worsening over the last 2 weeks. Personal history of melanoma metastatic to the brain. EXAM: CT HEAD WITHOUT CONTRAST TECHNIQUE: Contiguous axial images were obtained from the base of the skull through the vertex without intravenous contrast. COMPARISON:  Multiple exams, including CT head 12/31/2020 and MRI brain 03/23/2021. FINDINGS: Brain: Left frontal lobe metastatic lesion near the vertex measures 2.9 by 2.3 cm on image 25 series 4, previously 2.4 by 1.2 cm on 12/31/2020 and previously 1.6 by 1.6 cm on 03/23/2021. The lesion continues to be high in density, as on 12/31/2020, probably reflecting some associated petechial hemorrhage. Substantial surrounding vasogenic edema. Mild effacement of adjacent sulci. Small remote infarct in the left frontal periventricular white matter. Periventricular white matter and corona radiata hypodensities favor chronic ischemic microvascular white matter disease. Dilated perivascular space or remote small lacunar infarct posteriorly along the left lentiform nucleus margin. I not see any new metastatic lesions. Vascular: Unremarkable Skull: Unremarkable Sinuses/Orbits: Chronic sinusitis posteriorly in the left ethmoid air cells. Other: No supplemental  non-categorized findings. IMPRESSION: 1. Enlarging dense left frontal lobe mass at the vertex, currently 2.9 by 2.3 cm, most compatible with enlarging metastatic lesion. Worsening surrounding vasogenic edema. 2. Mild chronic left ethmoid sinusitis. 3. Periventricular white matter and corona radiata hypodensities favor chronic ischemic microvascular white matter disease. Small remote lacunar infarcts in the left periventricular white matter and along the posterior margin of the left lentiform nucleus. Electronically Signed   By: Van Clines M.D.   On: 04/23/2021 12:15   MM 3D SCREEN BREAST BILATERAL  Result Date: 03/28/2021 CLINICAL DATA:  Screening. EXAM: DIGITAL SCREENING BILATERAL MAMMOGRAM WITH TOMOSYNTHESIS AND CAD TECHNIQUE: Bilateral screening digital craniocaudal and mediolateral oblique mammograms were obtained. Bilateral screening digital breast tomosynthesis was performed. The images were evaluated with computer-aided detection. COMPARISON:  Previous exam(s). ACR Breast Density Category c: The breast tissue is heterogeneously dense, which may obscure small masses. FINDINGS: There are no findings suspicious for malignancy. IMPRESSION: No mammographic evidence of malignancy. A result letter of this screening mammogram will be mailed directly to the patient. RECOMMENDATION: Screening mammogram in one year. (Code:SM-B-01Y) BI-RADS CATEGORY  1: Negative. Electronically Signed   By: Nolon Nations M.D.   On: 03/28/2021 15:14    ________________________________  Sheral Apley Tammi Klippel, M.D.

## 2021-04-23 NOTE — H&P (Addendum)
NEUROLOGY H&P   Date of service: April 23, 2021 Patient Name: Angela Carpenter MRN:  [id number] DOB:  September 20, 1941   History of Present Illness   80 yo patient with hx metastatic melanoma to the brain w/ known hemorrhagic met L frontal lobe s/p SRS, breast cancer, a fib not on anticoagulation or ASA 2/2 hemorrhagic met admitted with worsening R sided weakness in setting of marked expansion of LF hemorrhagic met.  She is f/b Dr. Mickeal Skinner and Dr. Alen Blew as o/p. She was on decadron taper o/p and had decreased last week to 76m bid. Over the past 4-5 days she developed marked worsening of her baseline R sided weakness and is now completely flaccid in RUE and RLE. Last week she was able to walk (with some difficulty) and move her RUE some. She is also having more difficulty with speech. CT head today showed marked enlargement of hemorrhagic met and surrounding vasogenic edema.  MRI brain wwo thias afternoon showed significant interval increase in  size of a hemorrhagic superior left frontal lobe intraparenchymal metastasis that measures 3.1 x 2.6 x 3.0 cm (previously 1.5 x 1.4 x 1.8 cm when remeasured on MRI brain 03/23/21). Significant blood products. Increased surrounding vasogenic edema with local mass effect. No midline shift. CNS imaging personally reviewed.  She is also on [place].    ROS   Per HPI; all other systems reviewed and were negative  Past History   Past Medical History:  Diagnosis Date   A-fib ([facility]    Atrial fibrillation ([facility]    4 years ago started   Brain cancer [place] 12[place]   [place]   Colon polyps    Glaucoma    Hypercholesteremia    Hypertension    patient denies    Osteoporosis    Personal history of chemotherapy 2006   Personal history of radiation therapy 2006   [place]   Past Surgical History:  Procedure Laterality Date   AUGMENTATION MAMMAPLASTY Bilateral 2006   Patient had them removed in 2008   BREAST BIOPSY Left  2016   BREAST LUMPECTOMY Right 2006   COLONOSCOPY     had polyps   EYE SURGERY Right    cataract   MELANOMA EXCISION WITH SENTINEL LYMPH NODE BIOPSY N/A 06/02/2019   Procedure: Left lateral ANAL MELANOMA EXCISION WITH Bilateral Groin exploration and SENTINEL node mapping;  Surgeon: TLeighton Ruff MD;  [facility]  Service: General;  Laterality: N/A;   TONSILLECTOMY     Family History  Problem Relation Age of Onset   Lung cancer Mother    Hypotension Mother    Pancreatic cancer Father    Hypertension Sister    Kidney cancer Brother    Cirrhosis Maternal Grandfather    Breast cancer Paternal Grandmother    Cirrhosis Paternal Grandfather    Breast cancer Paternal Aunt    Social History   Socioeconomic History   Marital status: Widowed    Spouse name: Not on file   Number of children: 1   Years of education: Not on file   Highest education level: Not on file  Occupational History   Occupation: retired  Tobacco Use   Smoking status: Passive Smoke Exposure - Never Smoker   Smokeless tobacco: Never   Tobacco comments:    smoked in college but didn't inhale  Vaping Use   Vaping Use: Never used  Substance and Sexual Activity   Alcohol use: No    Alcohol/week:  0.0 standard drinks   Drug use: No   Sexual activity: Not on file  Other Topics Concern   Not on file  Social History Narrative   Not on file   Social Determinants of Health   Financial Resource Strain: Not on file  Food Insecurity: Not on file  Transportation Needs: Not on file  Physical Activity: Not on file  Stress: Not on file  Social Connections: Not on file   No Known Allergies  Medications    Current Facility-Administered Medications:     stroke: mapping our early stages of recovery book, , Does not apply, Once, Derek Jack, MD   0.9 %  sodium chloride infusion, 500 mL, Intravenous, Continuous, Danis, Estill Cotta III, MD   acetaminophen (TYLENOL) tablet 650 mg, 650 mg, Oral, Q4H PRN **OR**  acetaminophen (TYLENOL) 160 MG/5ML solution 650 mg, 650 mg, Per Tube, Q4H PRN **OR** acetaminophen (TYLENOL) suppository 650 mg, 650 mg, Rectal, Q4H PRN, Derek Jack, MD   atorvastatin (LIPITOR) tablet 10 mg, 10 mg, Oral, Daily, Su Monks M, MD, 10 mg at 04/23/21 1536   clevidipine (CLEVIPREX) infusion 0.5 mg/mL, 0-21 mg/hr, Intravenous, Continuous, Derek Jack, MD   dexamethasone (DECADRON) tablet 8 mg, 8 mg, Oral, Q12H, Derek Jack, MD   levETIRAcetam (KEPPRA) tablet 500 mg, 500 mg, Oral, BID, Derek Jack, MD   metoprolol succinate (TOPROL-XL) 24 hr tablet 25 mg, 25 mg, Oral, BID, Su Monks M, MD, 25 mg at 04/23/21 1536   pantoprazole (PROTONIX) injection 40 mg, 40 mg, Intravenous, QHS, Derek Jack, MD   [START ON 04/24/2021] raloxifene (EVISTA) tablet 60 mg, 60 mg, Oral, q AM, Derek Jack, MD   senna-docusate (Senokot-S) tablet 1 tablet, 1 tablet, Oral, BID, Derek Jack, MD, 1 tablet at 04/23/21 1536  Current Outpatient Medications:    dexamethasone (DECADRON) 1 MG tablet, Take 3 tablets (3 mg total) by mouth daily. (Patient taking differently: Take by mouth See admin instructions.), Disp: 90 tablet, Rfl: 1   levETIRAcetam (KEPPRA) 500 MG tablet, Take 1 tablet (500 mg total) by mouth 2 (two) times daily., Disp: 60 tablet, Rfl: 0   acyclovir (ZOVIRAX) 400 MG tablet, Take 400 mg by mouth 2 (two) times daily., Disp: , Rfl:    alendronate (FOSAMAX) 70 MG tablet, Take 70 mg by mouth once a week. Take with a full glass of water on an empty stomach., Disp: , Rfl:    apixaban (ELIQUIS) 5 MG TABS tablet, , Disp: , Rfl:    aspirin EC 81 MG tablet, Take 81 mg by mouth daily. Swallow whole., Disp: , Rfl:    atorvastatin (LIPITOR) 10 MG tablet, Take 1 tablet (10 mg total) by mouth daily., Disp: 90 tablet, Rfl: 0   Cholecalciferol (VITAMIN D-3) 125 MCG (5000 UT) TABS, Take 5,000 Units by mouth daily. , Disp: , Rfl:    metoprolol succinate (TOPROL-XL) 25 MG 24 hr  tablet, Take 1 tablet (25 mg total) by mouth 2 (two) times daily., Disp: 180 tablet, Rfl: 0   Multiple Vitamins-Minerals (PRESERVISION AREDS 2+MULTI VIT) CAPS, Take 1 capsule by mouth 2 (two) times daily., Disp: , Rfl:    raloxifene (EVISTA) 60 MG tablet, Take 60 mg by mouth in the morning., Disp: , Rfl:    timolol (TIMOPTIC) 0.5 % ophthalmic solution, Place 1 drop into both eyes 2 (two) times daily., Disp: , Rfl:      Vitals   Vitals:   04/23/21 1630 04/23/21 1730 04/23/21  1800 04/23/21 1830  BP: 116/69 111/68 124/72 128/75  Pulse: 68 65 73 78  Resp: '17 17 18 19  ' Temp:      TempSrc:      SpO2: 100% 98% 98% 98%  Weight:      Height:         Body mass index is 24.96 kg/m.  Physical Exam   Physical Exam Gen: A&O x4, NAD HEENT: Atraumatic, normocephalic;mucous membranes moist; oropharynx clear, tongue without atrophy or fasciculations. Neck: Supple, trachea midline. Resp: CTAB, no w/r/r CV: RRR, no m/g/r; nml S1 and S2. 2+ symmetric peripheral pulses. Abd: soft/NT/ND; nabs x 4 quad Extrem: Nml bulk; no cyanosis, clubbing, or edema.  Neuro: *MS: A&O x4. Follows multi-step commands.  *Speech: fluid, nondysarthric, mild WFD but able to name and repeat *CN:    I: Deferred   II,III: PERRLA, VFF by confrontation, optic discs not visualized 2/2 pupillary constriction   III,IV,VI: EOMI w/o nystagmus, no ptosis   V: Sensation intact from V1 to V3 to LT   VII: Eyelid closure was full.  R UMN facial droop   VIII: Hearing intact to voice   IX,X: Voice normal, palate elevates symmetrically    XI: SCM/trap 5/5 bilat   XII: Tongue protrudes midline, no atrophy or fasciculations  *Motor:   Normal bulk.  No tremor, rigidity or bradykinesia. Flaccid paralysis of RUE and RLE. L side full strength throughout. *Sensory: Intact to light touch, pinprick, temperature vibration throughout. Symmetric. Propioception intact bilat.  No double-simultaneous extinction.  *Coordination:  FNF intact on  L. *Reflexes:  2+ more brisk on R, toes mute LUE upgoing R *Gait: UTA 2/2 weakness   Premorbid mRS = 3  ICH score = 0   Labs   CBC:  Recent Labs  Lab 04/23/21 1315  WBC 22.1*  NEUTROABS 17.3*  HGB 15.4*  HCT 47.3*  MCV 97.3  PLT 329    Basic Metabolic Panel:  Lab Results  Component Value Date   NA 135 04/23/2021   K 3.4 (L) 04/23/2021   CO2 28 04/23/2021   GLUCOSE 105 (H) 04/23/2021   BUN 25 (H) 04/23/2021   CREATININE 0.62 04/23/2021   CALCIUM 9.0 04/23/2021   GFRNONAA >60 04/23/2021   GFRAA >60 06/30/2020   Lipid Panel:  Lab Results  Component Value Date   LDLCALC 65 12/05/2016   HgbA1c:  Lab Results  Component Value Date   HGBA1C 6.1 (H) 12/11/2020   Urine Drug Screen: No results found for: LABOPIA, COCAINSCRNUR, LABBENZ, AMPHETMU, THCU, LABBARB  Alcohol Level     Component Value Date/Time   ETH <10 12/11/2020 1703    Impression   80 yo patient with hx metastatic melanoma to the brain w/ known hemorrhagic met L frontal lobe s/p SRS, breast cancer, a fib not on anticoagulation or ASA 2/2 hemorrhagic met admitted with worsening R sided weakness in setting of marked expansion of LF hemorrhagic met.  She has always had hyperintensity on head CT 2/2 petechial hemorrhage there but given her rapid worsening over the weekend and appearance on personal review of the imaging I am concerned that her worsening is due at least in part to further hemorrhage into the met.  Recommendations   # Significant expansion of L frontal hemorrhagic metastasis (melanoma) with marked vasogenic edema - Admit to ICU under Dr. Quinn Axe - Clevidipine for goal SBP <160 - Head CT @ 2030 and 0230 assess stability - 70m IV decadron now f/b [id number] hrs - No  AC or ASA. SCDs for DVT prophylaxis - Continue home prophylactic keppra - HOB elevated 30 degrees - Bedrest x24 hrs - Neuro-onc consulted; case d/w Dr. Tammi Klippel by phone. He will see patient as well as d/w o/p oncologist and also  [facility] to see if any role for surgical intervention. Appreciate assistance. - I will hold Beryle Flock until they weigh in given potential worsening of hemorrhage in setting of [place].  # A fib - No AC or ASA. SCDs for DVT prophylaxis ______________________________________________________________________  This patient is critically ill and at significant risk of neurological worsening, death and care requires constant monitoring of vital signs, hemodynamics,respiratory and cardiac monitoring, neurological assessment, discussion with family, other specialists and medical decision making of high complexity. I spent 60 minutes of neurocritical care time  in the care of  this patient. This was time spent independent of any time provided by nurse practitioner or PA.  Su Monks, MD [facility] [phone]  If 7pm- 7am, please page neurology on call as listed in [place].   Signed,  Su Monks, MD [facility] [phone] If 7pm- 7am, please page neurology on call as listed in [place].

## 2021-04-23 NOTE — ED Provider Notes (Signed)
[facility] EMERGENCY DEPARTMENT Provider Note   CSN: [id number] Arrival date & time: 04/23/21  1027     History Chief Complaint  Patient presents with   Weakness    Gradual increasing R sided weakness over the last two weeks as she has weaned steroid taken for brain cancer    Angela Carpenter is a 80 y.o. female.  HPI Patient has known metastatic brain cancer with prior history of anal melanoma.  Patient had been recommended to taper her Decadron therapy starting 6\21\2022.  She is holding Eliquis with known history of atrial fibrillation.  Patient reports that she noticed a subtle change in degree of right sided weakness and problems before starting the Decadron taper but Tuesday or Wednesday last week, she started to get a noticeable decrease in function of her right upper and lower extremity.  By this weekend, she was getting some significant impairment and having difficulty with walking and moving the right arm.  Since yesterday, she reports she cannot move the right arm at all and can bear some weight on the right leg but cannot move it to walk.  She denies she has developed any headache.  She reports she is having some difficulty with word finding.  No other symptoms of constitutional illness.  Patient denies she has had fever, nausea, vomiting or general malaise.    Past Medical History:  Diagnosis Date   A-fib [facility])    Atrial fibrillation ([place])    4 years ago started   Brain cancer ([place]) 12/2020   Breast cancer ([place]) 2006   Colon polyps    Glaucoma    Hypercholesteremia    Hypertension    patient denies    Osteoporosis    Personal history of chemotherapy 2006   Personal history of radiation therapy 2006   Stroke (cerebrum) [facility]) 2015    Patient Active Problem List   Diagnosis Date Noted   Brain metastasis ([place]) 12/12/2020   Seizure disorder, focal motor ([place]) 12/12/2020   ICH (intracerebral hemorrhage) ([place]) 12/11/2020   Encounter for antineoplastic  immunotherapy 06/30/2020   Malignant melanoma of anal canal ([place]) 07/21/2019   Hypertensive heart disease 12/04/2016   HX: anticoagulation 12/04/2016   PAF (paroxysmal atrial fibrillation) ([place]) 08/29/2015   Hyperlipidemia 08/29/2015   Atrial fibrillation ([place]) 08/28/2015    Past Surgical History:  Procedure Laterality Date   AUGMENTATION MAMMAPLASTY Bilateral 2006   Patient had them removed in 2008   BREAST BIOPSY Left 2016   BREAST LUMPECTOMY Right 2006   COLONOSCOPY     had polyps   EYE SURGERY Right    cataract   MELANOMA EXCISION WITH SENTINEL LYMPH NODE BIOPSY N/A 06/02/2019   Procedure: Left lateral ANAL MELANOMA EXCISION WITH Bilateral Groin exploration and SENTINEL node mapping;  Surgeon: [name], MD;  Location: [facility];  Service: General;  Laterality: N/A;   TONSILLECTOMY       OB History   No obstetric history on file.     Family History  Problem Relation Age of Onset   Lung cancer Mother    Hypotension Mother    Pancreatic cancer Father    Hypertension Sister    Kidney cancer Brother    Cirrhosis Maternal Grandfather    Breast cancer Paternal Grandmother    Cirrhosis Paternal Grandfather    Breast cancer Paternal Aunt     Social History   Tobacco Use   Smoking status: Passive Smoke Exposure - Never Smoker   Smokeless tobacco:  Never   Tobacco comments:    smoked in college but didn't inhale  Vaping Use   Vaping Use: Never used  Substance Use Topics   Alcohol use: No    Alcohol/week: 0.0 standard drinks   Drug use: No    Home Medications Prior to Admission medications   Medication Sig Start Date End Date Taking? Authorizing Provider  acyclovir (ZOVIRAX) 400 MG tablet Take 400 mg by mouth 2 (two) times daily.    [provider]  alendronate (FOSAMAX) 70 MG tablet Take 70 mg by mouth once a week. Take with a full glass of water on an empty stomach.    [provider]  apixaban (ELIQUIS) 5 MG TABS tablet  09/16/19   [provider]  aspirin EC 81 MG tablet Take 81 mg by mouth daily. Swallow whole.    [provider]  atorvastatin (LIPITOR) 10 MG tablet Take 1 tablet (10 mg total) by mouth daily. 03/08/21   Freada Bergeron, MD  Cholecalciferol (VITAMIN D-3) 125 MCG (5000 UT) TABS Take 5,000 Units by mouth daily.     [provider]  dexamethasone (DECADRON) 1 MG tablet Take 3 tablets (3 mg total) by mouth daily. 03/27/21   [name], MD  levETIRAcetam (KEPPRA) 500 MG tablet Take 1 tablet (500 mg total) by mouth 2 (two) times daily. 12/12/20 04/13/22  Bhagat, Dutch Quint, MD  metoprolol succinate (TOPROL-XL) 25 MG 24 hr tablet Take 1 tablet (25 mg total) by mouth 2 (two) times daily. 03/08/21   Freada Bergeron, MD  Multiple Vitamins-Minerals (PRESERVISION AREDS 2+MULTI VIT) CAPS Take 1 capsule by mouth 2 (two) times daily.    [provider]  raloxifene (EVISTA) 60 MG tablet Take 60 mg by mouth in the morning.    [provider]  timolol (TIMOPTIC) 0.5 % ophthalmic solution Place 1 drop into both eyes 2 (two) times daily. 11/19/16   [provider]    Allergies    Patient has no known allergies.  Review of Systems   Review of Systems 10 systems reviewed and negative except as per HPI Physical Exam Updated Vital Signs BP 127/70 (BP Location: Right Arm)   Pulse 79   Temp 98 F (36.7 C) (Oral)   Resp 16   Ht 5\' 5"  (1.651 m)   Wt 68 kg   SpO2 99%   BMI 24.96 kg/m   Physical Exam Constitutional:      Comments: Alert.  Speech is clear.  No respiratory distress.  Well-nourished well-developed.  HENT:     Head: Normocephalic and atraumatic.     Mouth/Throat:     Mouth: Mucous membranes are moist.     Pharynx: Oropharynx is clear.  Eyes:     Extraocular Movements: Extraocular movements intact.     Conjunctiva/sclera: Conjunctivae normal.     Pupils: Pupils are equal, round, and reactive to light.  Cardiovascular:     Rate and Rhythm: Normal rate  and regular rhythm.  Pulmonary:     Effort: Pulmonary effort is normal.     Breath sounds: Normal breath sounds.  Abdominal:     General: There is no distension.     Palpations: Abdomen is soft.     Tenderness: There is no abdominal tenderness. There is no guarding.  Musculoskeletal:     Right lower leg: No edema.     Left lower leg: No edema.  Skin:    General: Skin is warm and dry.  Neurological:     Comments: Patient is alert and oriented.  Does not have dense expressive or receptive aphasia.  Subtle word finding difficulty.  Near flaccid paralysis of right upper extremity.  Patient can slightly move fingers to attempt grip strength.  Normal grip on the left.  Right lower extremity near flaccid paralysis.  Patient can just barely make her toes wiggle.  Left lower extremity patient can easily elevate and move without difficulty.  Psychiatric:        Mood and Affect: Mood normal.    ED Results / Procedures / Treatments   Labs (all labs ordered are listed, but only abnormal results are displayed) Labs Reviewed  RESP PANEL BY RT-PCR (FLU A&B, COVID) [id number]  COMPREHENSIVE METABOLIC PANEL  CBC WITH DIFFERENTIAL/PLATELET  PROTIME-INR  URINALYSIS, ROUTINE W REFLEX MICROSCOPIC  MAGNESIUM  PHOSPHORUS    EKG EKG Interpretation  Date/Time:  Monday April 23 2021 10:34:39 EDT Ventricular Rate:  79 PR Interval:  135 QRS Duration: 88 QT Interval:  401 QTC Calculation: 460 R Axis:   62 Text Interpretation: Sinus rhythm RSR' in V1 or V2, probably normal variant dynamic, normalized t waves since last tracing, abnormal Confirmed by Charlesetta Shanks [phone]) on 04/23/2021 1:19:50 PM  Radiology CT Head Wo Contrast  Result Date: 04/23/2021 CLINICAL DATA:  Right-sided weakness progressively worsening over the last 2 weeks. Personal history of melanoma metastatic to the brain. EXAM: CT HEAD WITHOUT CONTRAST TECHNIQUE: Contiguous axial images were obtained from the base of the skull through the  vertex without intravenous contrast. COMPARISON:  Multiple exams, including CT head 12/31/2020 and MRI brain 03/23/2021. FINDINGS: Brain: Left frontal lobe metastatic lesion near the vertex measures 2.9 by 2.3 cm on image 25 series 4, previously 2.4 by 1.2 cm on 12/31/2020 and previously 1.6 by 1.6 cm on 03/23/2021. The lesion continues to be high in density, as on 12/31/2020, probably reflecting some associated petechial hemorrhage. Substantial surrounding vasogenic edema. Mild effacement of adjacent sulci. Small remote infarct in the left frontal periventricular white matter. Periventricular white matter and corona radiata hypodensities favor chronic ischemic microvascular white matter disease. Dilated perivascular space or remote small lacunar infarct posteriorly along the left lentiform nucleus margin. I not see any new metastatic lesions. Vascular: Unremarkable Skull: Unremarkable Sinuses/Orbits: Chronic sinusitis posteriorly in the left ethmoid air cells. Other: No supplemental non-categorized findings. IMPRESSION: 1. Enlarging dense left frontal lobe mass at the vertex, currently 2.9 by 2.3 cm, most compatible with enlarging metastatic lesion. Worsening surrounding vasogenic edema. 2. Mild chronic left ethmoid sinusitis. 3. Periventricular white matter and corona radiata hypodensities favor chronic ischemic microvascular white matter disease. Small remote lacunar infarcts in the left periventricular white matter and along the posterior margin of the left lentiform nucleus. Electronically Signed   By: Van Clines M.D.   On: 04/23/2021 12:15    Procedures Procedures   Medications Ordered in ED Medications  atorvastatin (LIPITOR) tablet 10 mg (has no administration in time range)  levETIRAcetam (KEPPRA) tablet 500 mg (has no administration in time range)  metoprolol succinate (TOPROL-XL) 24 hr tablet 25 mg (has no administration in time range)  raloxifene (EVISTA) tablet 60 mg (has no  administration in time range)  dexamethasone (DECADRON) injection 10 mg (has no administration in time range)    ED Course  I have reviewed the triage vital signs and the nursing notes.  Pertinent labs & imaging results that were available during my care of the patient were reviewed by me and considered in  my medical decision making (see chart for details).    MDM Rules/Calculators/A&P                          Patient presents as outlined.  She does have known metastatic melanoma to the brain.  Symptoms of unilateral weakness have progressed rapidly over the past 2 to 4 days.  Patient shows no signs of toxicity.  Vital signs are normal.  No signs of intercurrent infection.  We will proceed with CT scan and neurology consultation.  Anticipate MRI and admission.  CT identifies increased hemorrhage and growth into pre-existing metastatic brain lesion.   Consultation: Dr. Quinn Axe, neurology has seen the patient and will admit to neuro ICU for further management. Final Clinical Impression(s) / ED Diagnoses Final diagnoses:  Metastasis to brain ([place])  Hemiparesis, unspecified hemiparesis etiology, unspecified laterality ([place])    Rx / DC Orders ED Discharge Orders     None        Charlesetta Shanks, MD 04/23/21 1321

## 2021-04-24 ENCOUNTER — Encounter: Payer: Self-pay | Admitting: Oncology

## 2021-04-24 ENCOUNTER — Inpatient Hospital Stay (HOSPITAL_COMMUNITY): Payer: Medicare Other

## 2021-04-24 DIAGNOSIS — C7931 Secondary malignant neoplasm of brain: Secondary | ICD-10-CM | POA: Diagnosis not present

## 2021-04-24 DIAGNOSIS — C211 Malignant neoplasm of anal canal: Secondary | ICD-10-CM

## 2021-04-24 DIAGNOSIS — G8190 Hemiplegia, unspecified affecting unspecified side: Secondary | ICD-10-CM | POA: Diagnosis not present

## 2021-04-24 DIAGNOSIS — I619 Nontraumatic intracerebral hemorrhage, unspecified: Secondary | ICD-10-CM

## 2021-04-24 DIAGNOSIS — I611 Nontraumatic intracerebral hemorrhage in hemisphere, cortical: Secondary | ICD-10-CM | POA: Diagnosis not present

## 2021-04-24 LAB — GLUCOSE, CAPILLARY: Glucose-Capillary: 148 mg/dL — ABNORMAL HIGH (ref 70–99)

## 2021-04-24 LAB — BASIC METABOLIC PANEL
Anion gap: 11 (ref 5–15)
BUN: 22 mg/dL (ref 8–23)
CO2: 24 mmol/L (ref 22–32)
Calcium: 9.1 mg/dL (ref 8.9–10.3)
Chloride: 101 mmol/L (ref 98–111)
Creatinine, Ser: 0.59 mg/dL (ref 0.44–1.00)
GFR, Estimated: >60 mL/min (ref >60)
Glucose, Bld: 172 mg/dL — ABNORMAL HIGH (ref 70–99)
Potassium: 3.9 mmol/L (ref 3.5–5.1)
Sodium: 136 mmol/L (ref 135–145)

## 2021-04-24 LAB — URINALYSIS, ROUTINE W REFLEX MICROSCOPIC
Bilirubin Urine: NEGATIVE
Glucose, UA: NEGATIVE mg/dL
Hgb urine dipstick: NEGATIVE
Ketones, ur: NEGATIVE mg/dL
Leukocytes,Ua: NEGATIVE
Nitrite: NEGATIVE
Protein, ur: NEGATIVE mg/dL
Specific Gravity, Urine: 1.016 (ref 1.005–1.030)
pH: 6 (ref 5.0–8.0)

## 2021-04-24 LAB — MRSA NEXT GEN BY PCR, NASAL: MRSA by PCR Next Gen: NOT DETECTED

## 2021-04-24 IMAGING — CT CT HEAD W/O CM
4 series · 16 of 47 positions shown, 18 images · non-contrast
Comparison: [DATE]

CLINICAL DATA: Hemorrhage follow-up

EXAM:
CT HEAD WITHOUT CONTRAST
TECHNIQUE: Contiguous axial images were obtained from the base of the skull
through the vertex without intravenous contrast.

[Series 3: head without · axial · non-contrast · 0.41mm/px · z∈[+1554,+1670]mm · 7 of 31 slices shown, 9 images]
[im 4/31  brain]
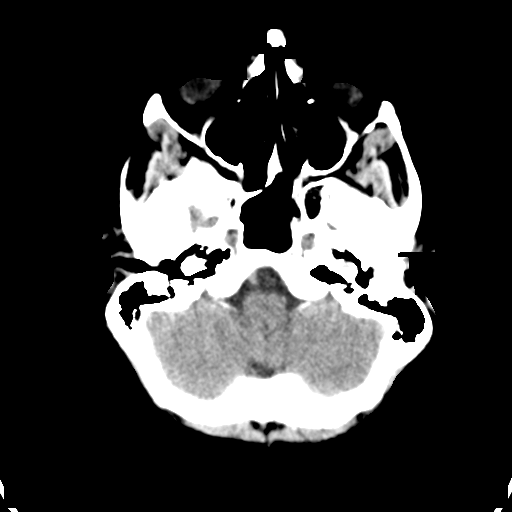
[im 4/31  bone]
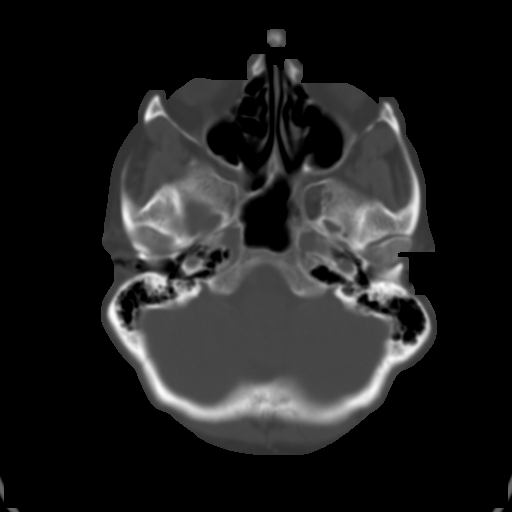
[im 8/31  brain]
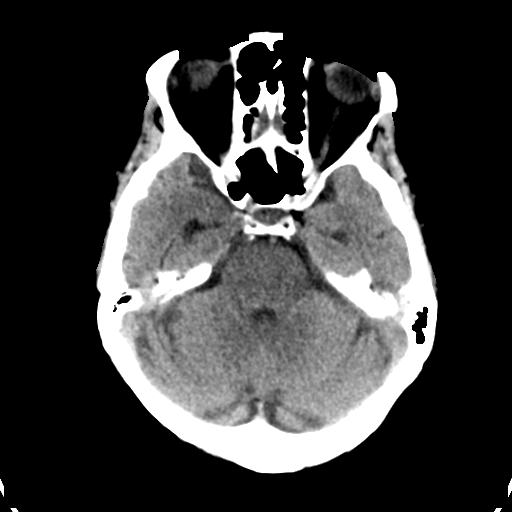
[im 12/31  brain]
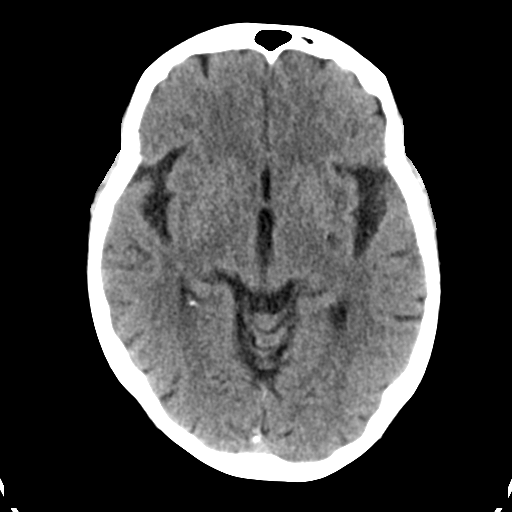
[im 16/31  brain]
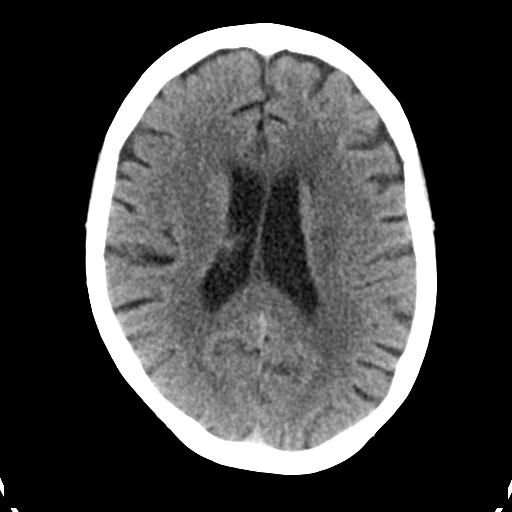
[im 19/31  brain]
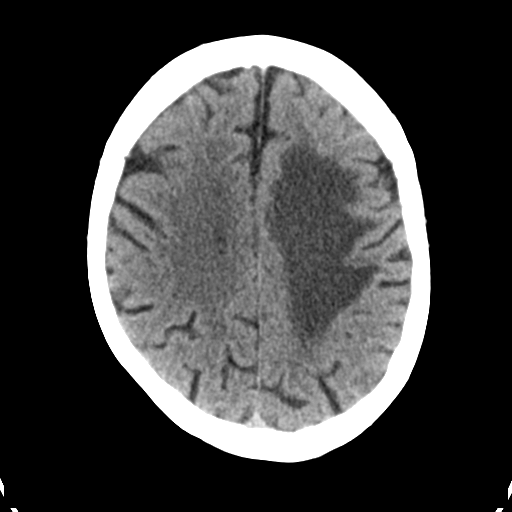
[im 19/31  bone]
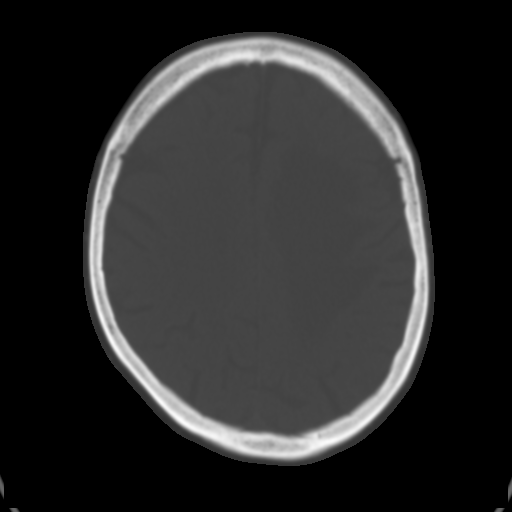
[im 23/31  brain]
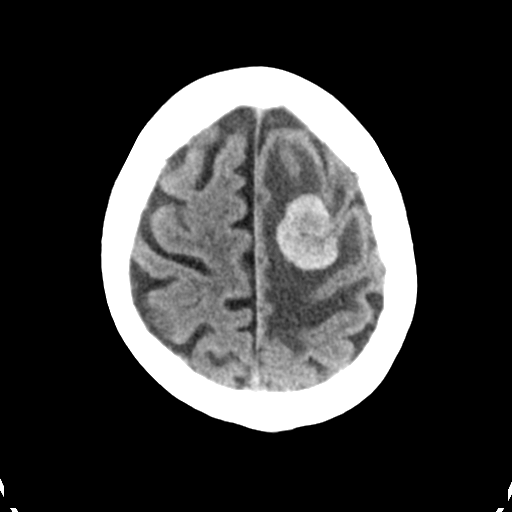
[im 27/31  brain]
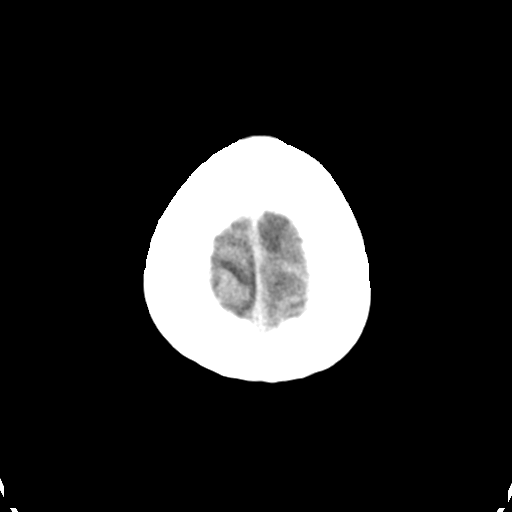

[Series 4: head bone · axial · 0.41mm/px · z∈[+1554,+1584]mm · 3 of 77 slices shown]
[im 8/77  bone]
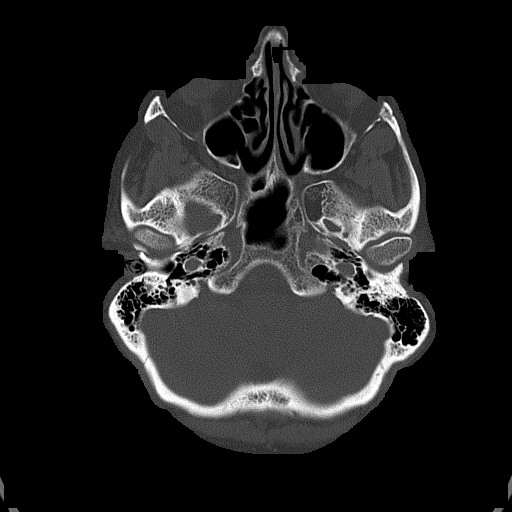
[im 16/77  bone]
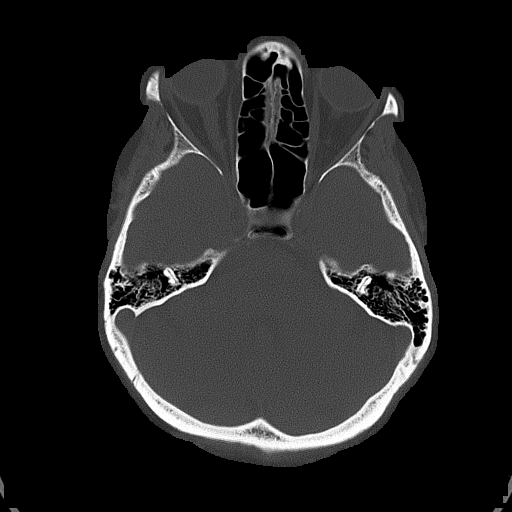
[im 23/77  bone]
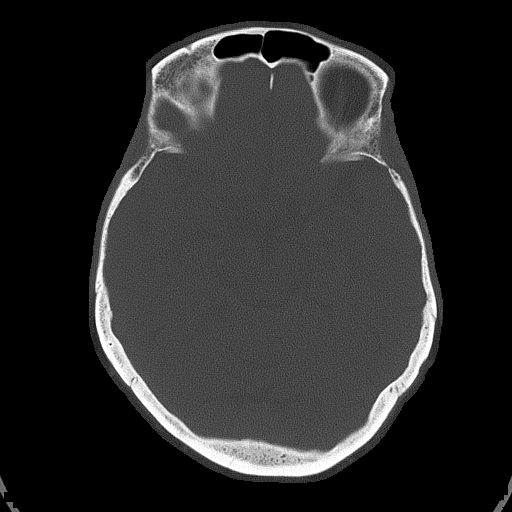

[Series 5: head without cor · coronal · non-contrast · 0.30mm/px · 3 of 67 slices shown]
[im 23/67  brain]
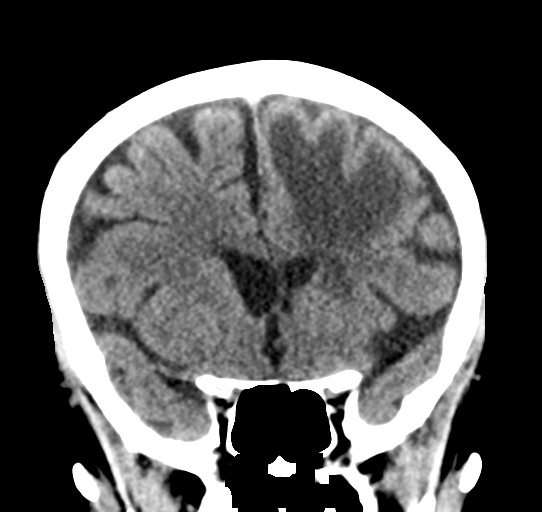
[im 30/67  brain]
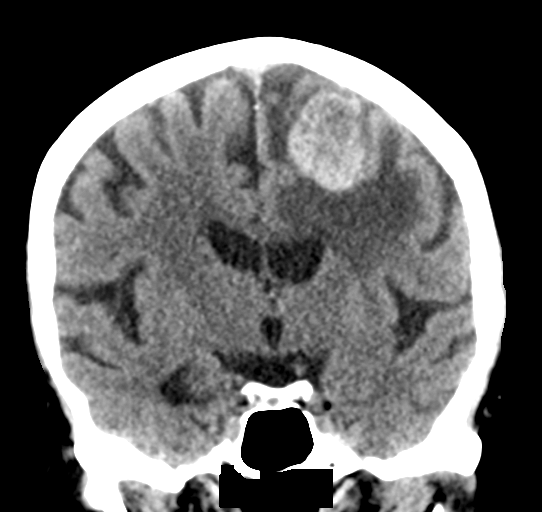
[im 37/67  brain]
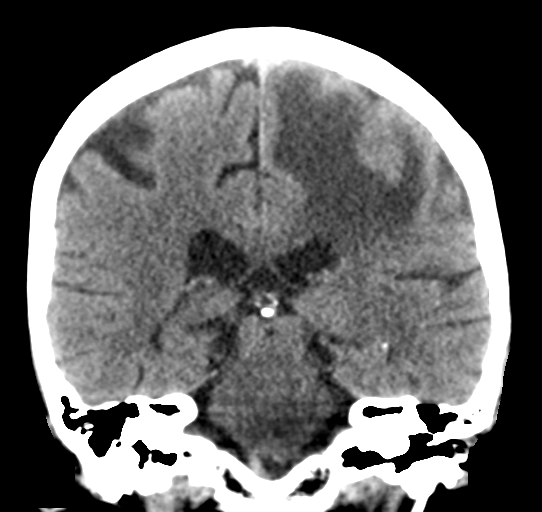

[Series 6: head without sag · sagittal · non-contrast · 0.30mm/px · 3 of 55 slices shown]
[im 19/55  brain]
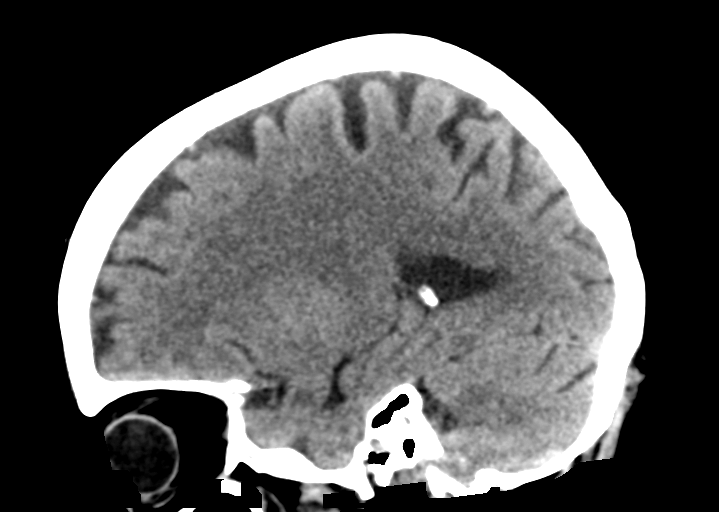
[im 28/55  brain]
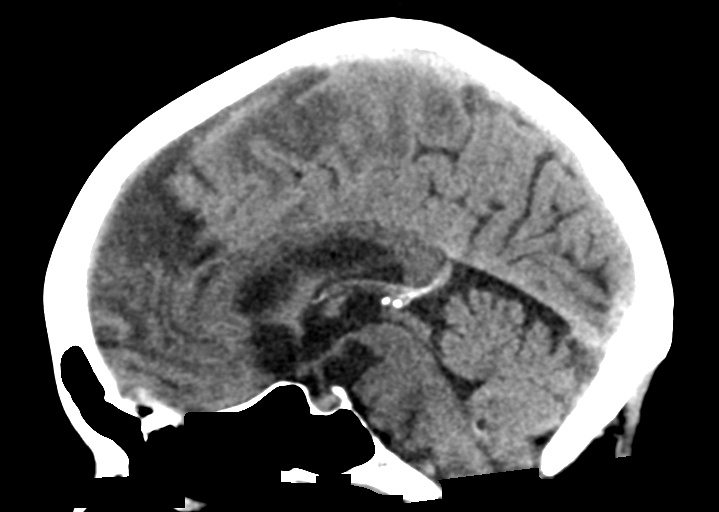
[im 37/55  brain]
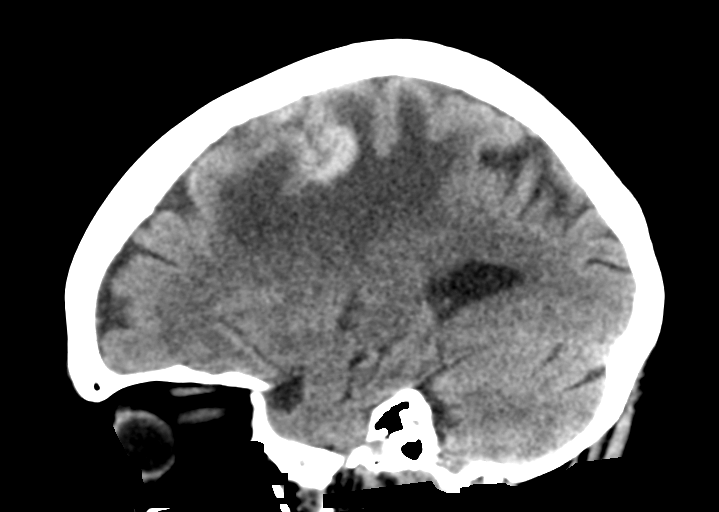

[16 of 47 positions shown; findings below may reference images not displayed]

FINDINGS: Brain: Unchanged size of intraparenchymal hematoma centered in the
superior left frontal lobe with surrounding vasogenic edema. No
midline shift or other mass effect. Old infarct of the left caudate
head.

Vascular: No abnormal hyperdensity of the major intracranial
arteries or dural venous sinuses. No intracranial atherosclerosis.

Skull: The visualized skull base, calvarium and extracranial soft
tissues are normal.

Sinuses/Orbits: No fluid levels or advanced mucosal thickening of
the visualized paranasal sinuses. No mastoid or middle ear effusion.
The orbits are normal.
IMPRESSION: Unchanged size of intraparenchymal hematoma centered in the superior
left frontal lobe with surrounding vasogenic edema.

## 2021-04-24 MED ORDER — CHLORHEXIDINE GLUCONATE CLOTH 2 % EX PADS
6.0000 | MEDICATED_PAD | Freq: Every day | CUTANEOUS | Status: DC
Start: 2021-04-24 — End: 2021-05-03
  Administered 2021-04-24 – 2021-05-03 (×9): 6 via TOPICAL

## 2021-04-24 MED ORDER — CHLORHEXIDINE GLUCONATE CLOTH 2 % EX PADS
6.0000 | MEDICATED_PAD | Freq: Every day | CUTANEOUS | Status: DC
  Administered 2021-04-24: 6 via TOPICAL

## 2021-04-24 MED ORDER — ACYCLOVIR 400 MG PO TABS
400.0000 mg | ORAL_TABLET | Freq: Two times a day (BID) | ORAL | Status: DC
  Administered 2021-04-24 – 2021-05-03 (×19): 400 mg via ORAL
  Filled 2021-04-24 (×22): qty 1

## 2021-04-24 NOTE — Progress Notes (Signed)
IP PROGRESS NOTE  Subjective:   Patient reports feeling reasonably well this afternoon.  She denies any specific complaints.  She denies any headaches or blurry vision.  She does report weakness on the right side upper and lower extremity.  Her speech is improved per her report.  Objective:  Vital signs in last 24 hours: Temp:  [98.5 F (36.9 C)-98.6 F (37 C)] 98.5 F (36.9 C) (07/19 0700) Pulse Rate:  [63-96] 72 (07/19 1600) Resp:  [15-27] 19 (07/19 1600) BP: (102-143)/(61-87) 119/78 (07/19 1600) SpO2:  [95 %-99 %] 96 % (07/19 1600) Weight:  [148 lb 5.9 oz (67.3 kg)] 148 lb 5.9 oz (67.3 kg) (07/19 0604) Weight change:  Last BM Date:  (PTA)  Intake/Output from previous day:  General: Alert, awake without distress. Head: Normocephalic atraumatic. Mouth: mucous membranes moist, pharynx normal without lesions Eyes: No scleral icterus.  Pupils are equal and round reactive to light. Resp: clear to auscultation bilaterally without rhonchi or wheezes or dullness to percussion. Cardio: regular rate and rhythm, S1, S2 normal, no murmur, click, rub or gallop GI: soft, non-tender; bowel sounds normal; no masses,  no organomegaly Musculoskeletal: No joint deformity or effusion. Neurological: Right-sided weakness upper and lower extremity. Skin: No rashes or lesions.   Lab Results: Recent Labs    04/23/21 1315  WBC 22.1*  HGB 15.4*  HCT 47.3*  PLT 282    BMET Recent Labs    04/23/21 1315 04/24/21 0824  NA 135 136  K 3.4* 3.9  CL 98 101  CO2 28 24  GLUCOSE 105* 172*  BUN 25* 22  CREATININE 0.62 0.59  CALCIUM 9.0 9.1    Studies/Results: CT HEAD WO CONTRAST  Result Date: 04/24/2021 CLINICAL DATA:  Hemorrhage follow-up EXAM: CT HEAD WITHOUT CONTRAST TECHNIQUE: Contiguous axial images were obtained from the base of the skull through the vertex without intravenous contrast. COMPARISON:  04/23/2021 FINDINGS: Brain: Unchanged size of intraparenchymal hematoma centered in  the superior left frontal lobe with surrounding vasogenic edema. No midline shift or other mass effect. Old infarct of the left caudate head. Vascular: No abnormal hyperdensity of the major intracranial arteries or dural venous sinuses. No intracranial atherosclerosis. Skull: The visualized skull base, calvarium and extracranial soft tissues are normal. Sinuses/Orbits: No fluid levels or advanced mucosal thickening of the visualized paranasal sinuses. No mastoid or middle ear effusion. The orbits are normal. IMPRESSION: Unchanged size of intraparenchymal hematoma centered in the superior left frontal lobe with surrounding vasogenic edema. Electronically Signed   By: Ulyses Jarred M.D.   On: 04/24/2021 02:59   CT HEAD WO CONTRAST  Result Date: 04/23/2021 CLINICAL DATA:  Follow-up parenchymal hemorrhage. EXAM: CT HEAD WITHOUT CONTRAST TECHNIQUE: Contiguous axial images were obtained from the base of the skull through the vertex without intravenous contrast. COMPARISON:  Head CT and brain MRI earlier today. FINDINGS: Brain: No significant change in the hemorrhagic left frontal lobe lesion measuring 2.9 x 2.3 cm. Stable degree of surrounding vasogenic edema with localized mass effect, including the frontal horn of the left lateral ventricle. There is otherwise no significant change from exams earlier today. No evidence of new lesion or hemorrhage. No subdural collection. No hydrocephalus. Vascular: No hyperdense vessel. Skull: No fracture or focal lesion. Sinuses/Orbits: Stable lung mucosal thickening and opacification of scattered left ethmoid air cells. Right cataract resection. Other: No mastoid effusion. IMPRESSION: 1. No significant change in hemorrhagic left frontal lobe lesion with surrounding vasogenic edema and localized mass effect. 2. No new abnormality  from exams earlier today. Electronically Signed   By: Keith Rake M.D.   On: 04/23/2021 19:17   CT Head Wo Contrast  Result Date:  04/23/2021 CLINICAL DATA:  Right-sided weakness progressively worsening over the last 2 weeks. Personal history of melanoma metastatic to the brain. EXAM: CT HEAD WITHOUT CONTRAST TECHNIQUE: Contiguous axial images were obtained from the base of the skull through the vertex without intravenous contrast. COMPARISON:  Multiple exams, including CT head 12/31/2020 and MRI brain 03/23/2021. FINDINGS: Brain: Left frontal lobe metastatic lesion near the vertex measures 2.9 by 2.3 cm on image 25 series 4, previously 2.4 by 1.2 cm on 12/31/2020 and previously 1.6 by 1.6 cm on 03/23/2021. The lesion continues to be high in density, as on 12/31/2020, probably reflecting some associated petechial hemorrhage. Substantial surrounding vasogenic edema. Mild effacement of adjacent sulci. Small remote infarct in the left frontal periventricular white matter. Periventricular white matter and corona radiata hypodensities favor chronic ischemic microvascular white matter disease. Dilated perivascular space or remote small lacunar infarct posteriorly along the left lentiform nucleus margin. I not see any new metastatic lesions. Vascular: Unremarkable Skull: Unremarkable Sinuses/Orbits: Chronic sinusitis posteriorly in the left ethmoid air cells. Other: No supplemental non-categorized findings. IMPRESSION: 1. Enlarging dense left frontal lobe mass at the vertex, currently 2.9 by 2.3 cm, most compatible with enlarging metastatic lesion. Worsening surrounding vasogenic edema. 2. Mild chronic left ethmoid sinusitis. 3. Periventricular white matter and corona radiata hypodensities favor chronic ischemic microvascular white matter disease. Small remote lacunar infarcts in the left periventricular white matter and along the posterior margin of the left lentiform nucleus. Electronically Signed   By: Van Clines M.D.   On: 04/23/2021 12:15   MR BRAIN W WO CONTRAST  Result Date: 04/23/2021 CLINICAL DATA:  Brain/CNS surveillance. EXAM:  MRI HEAD WITHOUT AND WITH CONTRAST TECHNIQUE: Multiplanar, multiecho pulse sequences of the brain and surrounding structures were obtained without and with intravenous contrast. CONTRAST:  52mL GADAVIST GADOBUTROL 1 MMOL/ML IV SOLN COMPARISON:  MRI March 23, 2021. FINDINGS: Brain: Significant interval increase in size of the superior left frontal intraparenchymal metastasis, which now measures 3.1 x 2.6 x 3.0 cm (previously 1.5 x 1.4 x 1.8 cm when remeasured). A portion of the lesion is intrinsically T1 hyperintense, compatible with hemorrhage. The lesion demonstrates predominantly peripheral enhancement. Increased surrounding vasogenic edema in the frontal and parietal lobes with associated regional mass effect. No midline shift. Basal cisterns are patent. No acute infarct. No hydrocephalus. No extra-axial fluid collection. No acute hemorrhage outside of the metastatic lesion. Vascular: Major arterial flow voids are maintained at the skull base. Skull and upper cervical spine: Normal marrow signal. Sinuses/Orbits: Mild ethmoid air cell mucosal thickening. Unremarkable orbits. Other: No sizable mastoid effusions. IMPRESSION: 1. Significant interval increase in the size of a hemorrhagic superior left frontal lobe intraparenchymal metastasis that measures 3.1 x 2.6 x 3.0 cm (previously 1.5 x 1.4 x 1.8 cm when remeasured). Increased surrounding vasogenic edema with local mass effect. No midline shift. 2. No new lesions identified. Electronically Signed   By: Margaretha Sheffield MD   On: 04/23/2021 15:24    Medications: I have reviewed the patient's current medications.  Assessment/Plan:  80 year old with:  1.  Intracranial metastasis, primary melanoma presented with frontal lobe fall hemorrhage and vasogenic edema.  She has right-sided weakness with flaccid paralysis in her right upper and lower extremity.  Imaging studies were reviewed and discussed with the patient today.  These were also discussed with her son  via phone.  The management of the intracranial tumor will require multidisciplinary management with discussion with radiation oncology and neurosurgery.  Surgical resection might be the most definitive approach at this time.  Neurosurgery evaluation is currently pending.  I do not believe that Beryle Flock has contributed to to her intracranial hemorrhage.  In any case therapy will be on hold for the time being.   2.  Mucosal melanoma: Systemic management at this time has been deferred given her CNS progression.  We have discussed the role for combined immunotherapy with ipilimumab and nivolumab given her high risk disease at this time.  This will be deferred to an outpatient discussion when she is recovered.  3.  Follow-up: We will continue to follow her progress while she is hospitalized. Will arrange follow-up upon her discharge.   35  minutes were dedicated to this visit. The time was spent on reviewing laboratory data, imaging studies, discussing treatment options, discussing differential diagnosis and reviewing our future plan of care with the patient and her son via phone.    LOS: 1 day   Zola Button 04/24/2021, 4:38 PM

## 2021-04-24 NOTE — Evaluation (Signed)
Occupational Therapy Evaluation Patient Details Name: Angela Carpenter MRN: [id number] DOB: 1941-06-30 Today's Date: 04/24/2021    History of Present Illness pt is a 80 y/o female presenting to ED with worsening R sided weakness in setting of marked expansion of L frotnal hemorrhagic met with surrounding vasogenic edema. Patient now completely flaccid in RUE/RLE with dysarthria. 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   Clinical Impression   PTA patient was living alone in a private residence with several STE and was grossly I with ADLs/IADLs without AD. Patient reports household and community ambulation without AD but reports use of RW several days PTA. Patient currently functioning below baseline requiring Mod A to Max A for bed mobility and +2 assist for sit to stand and functional transfers. Patient also limited by deficits listed below including R-sided hemiplegia RUE>RLE and decreased static/dynamic sitting/standing balance and would benefit from continued acute OT services in prep for safe d/c to next level of care with recommendation for CIR.     Follow Up Recommendations  CIR    Equipment Recommendations  Other (comment) (Defer to next level of care.)    Recommendations for Other Services Rehab consult     Precautions / Restrictions Precautions Precautions: Fall Precaution Comments: R hemiplegia Restrictions Weight Bearing Restrictions: No      Mobility Bed Mobility Overal bed mobility: Needs Assistance Bed Mobility: Supine to Sit     Supine to sit: Mod assist     General bed mobility comments: Assist to advance RLE toward EOB; able to elevate trunk +rail. Min A with use of chuck pad for anterior scoot toward EOB.    Transfers Overall transfer level: Needs assistance Equipment used: 2 person  hand held assist Transfers: Sit to/from [facility] Sit to Stand: Min assist;+2 physical assistance;+2 safety/equipment Stand pivot transfers: Mod assist;+2 physical assistance;+2 safety/equipment       General transfer comment: Min A +2 for sit to stand from EOB with R knee block. Mod A +2 for stand-pivot to recliner in R with R knee block and external assist to advance RLE.    Balance Overall balance assessment: Needs assistance Sitting-balance support: Single extremity supported;Feet supported Sitting balance-Leahy Scale: Fair Sitting balance - Comments: Min gaurd to supervision A with unilateral UE support on bed surface to maintain static sitting balance.   Standing balance support: Bilateral upper extremity supported;During functional activity Standing balance-Leahy Scale: Poor Standing balance comment: Reliant on R knee block and +2 external assist to maintain static standing balance.                           ADL either performed or assessed with clinical judgement   ADL Overall ADL's : Needs assistance/impaired                     Lower Body Dressing: Maximal assistance;Bed level Lower Body Dressing Details (indicate cue type and reason): Max A to don footwear in supine. Toilet Transfer: Moderate assistance;+2 for physical assistance;+2 for safety/equipment Toilet Transfer Details (indicate cue type and reason): Simulated with stand-pivot to recliner on R with R knee block and assist to advance RLE.           General ADL Comments: Patient greatly limited by R-sided flaccidity RUE>RLE and decrased sitting/standing balance.  Vision Baseline Vision/History: Wears glasses Wears Glasses:  (Reports using glasses for watching tv or using computer only.) Patient Visual Report: No change from baseline Vision Assessment?: No apparent visual deficits     Perception Perception Perception Tested?: Yes Comments: Appears WNL   Praxis  Praxis Praxis tested?: Within functional limits    Pertinent Vitals/Pain Pain Assessment: No/denies pain     Hand Dominance Right   Extremity/Trunk Assessment Upper Extremity Assessment Upper Extremity Assessment: RUE deficits/detail RUE Deficits / Details: Flaccid paralysis; no spontaneous movement noted RUE Sensation: WNL RUE Coordination: decreased fine motor;decreased gross motor   Lower Extremity Assessment Lower Extremity Assessment: Defer to PT evaluation   Cervical / Trunk Assessment Cervical / Trunk Assessment: Normal   Communication Communication Communication: Expressive difficulties;Other (comment) (Mild word finding deficits noted)   Cognition Arousal/Alertness: Awake/alert Behavior During Therapy: WFL for tasks assessed/performed Overall Cognitive Status: Within Functional Limits for tasks assessed                                 General Comments: A&Ox4; follows 1-2 step verbal commands with good accuracy; able to provide home set-up and PLOF.   General Comments  VSS on RA.    Exercises     Shoulder Instructions      Home Living Family/patient expects to be discharged to:: Private residence Living Arrangements: Alone Available Help at Discharge: Family;Available PRN/intermittently;Other (Comment) (Son comes and stays some nights) Type of Home: House Home Access: Stairs to enter [employer] of Steps: 4 Entrance Stairs-Rails: Left;Right;Can reach both Home Layout: Two level;Able to live on main level with bedroom/bathroom Alternate Level Stairs-Number of Steps: flight   Bathroom Shower/Tub: Occupational psychologist: Standard     Home Equipment: Grab bars - tub/shower;Shower seat;Cane - single point;Walker - 2 wheels;Transport chair          Prior Functioning/Environment Level of Independence: Independent        Comments: I with ADLs/IADLs; recent stopped driving secondary to seizure        OT Problem  List: Decreased strength;Decreased range of motion;Decreased activity tolerance;Impaired balance (sitting and/or standing);Decreased coordination;Decreased knowledge of use of DME or AE;Impaired tone;Impaired UE functional use;Pain;Increased edema      OT Treatment/Interventions: Self-care/ADL training;Therapeutic exercise;Neuromuscular education;DME and/or AE instruction;Therapeutic activities;Patient/family education;Balance training    OT Goals(Current goals can be found in the care plan section) Acute Rehab OT Goals Patient Stated Goal: To go to CIR OT Goal Formulation: With patient Time For Goal Achievement: 05/08/21 Potential to Achieve Goals: Good ADL Goals Pt Will Perform Eating: Independently;sitting Pt Will Perform Grooming: with set-up;sitting Pt Will Perform Upper Body Dressing: with set-up Pt Will Perform Lower Body Dressing: with min assist;sit to/from stand;sitting/lateral leans Pt Will Transfer to Toilet: with min assist;bedside commode;ambulating Pt Will Perform Toileting - Clothing Manipulation and hygiene: with min assist;sit to/from stand Additional ADL Goal #1: Patient will complete sit to stand transfers with Min A and use AE PRN. Additional ADL Goal #2: Patient will complete stand-pivot transfers with Min A and AD PRN in prep for ADLs.  OT Frequency: Min 2X/week   Barriers to D/C: Inaccessible home environment;Decreased caregiver support          Co-evaluation              AM-PAC OT "6 Clicks" Daily Activity     Outcome Measure Help from another person eating meals?: A Lot Help from another  person taking care of personal grooming?: A Lot Help from another person toileting, which includes using toliet, bedpan, or urinal?: A Lot Help from another person bathing (including washing, rinsing, drying)?: A Lot Help from another person to put on and taking off regular upper body clothing?: A Lot Help from another person to put on and taking off regular lower  body clothing?: A Lot 6 Click Score: 12   End of Session Equipment Utilized During Treatment: Gait belt Nurse Communication: Mobility status;Other (comment) (Response to treatment.)  Activity Tolerance: Patient tolerated treatment well Patient left: in chair;with call bell/phone within reach;with chair alarm set  OT Visit Diagnosis: Unsteadiness on feet (R26.81);Other abnormalities of gait and mobility (R26.89);Muscle weakness (generalized) (M62.81);Hemiplegia and hemiparesis Hemiplegia - Right/Left: Right Hemiplegia - dominant/non-dominant: Dominant Hemiplegia - caused by: Other Nontraumatic intracranial hemorrhage                Time: 0199-2415 OT Time Calculation (min): 24 min Charges:  OT General Charges $OT Visit: 1 Visit OT Evaluation $OT Eval Moderate Complexity: 1 Mod OT Treatments $Therapeutic Activity: 8-22 mins  Yarelis Ambrosino H. OTR/L Supplemental OT, Department of rehab services [phone]  Thales Knipple R H. 04/24/2021, 12:46 PM

## 2021-04-24 NOTE — Progress Notes (Signed)
Events noted.  Patient known to me with advanced melanoma currently on Keytruda.  Patient presented with right-sided weakness and found to have hemorrhagic metastasis based on a CT scan which revealed left frontal lobe mass measuring 2.0 x 2.3 cm compatible with enlarging metastatic lesion.  MRI obtained on April 23, 2021 showed the left frontal lobe lesion measuring 3.1 x 2.6 x 3.0 cm with vasogenic edema.  She was started on Decadron 10 mg IV with a follow-up CT scan without contrast earlier this morning did not show any expansion of her intraparenchymal hematoma.  From a management standpoint, I agree with holding systemic treatment with Keytruda at this time.  Management of her overall isolated frontal lobe lesion will require multidisciplinary approach.  We will await input from neurosurgery, Dr. Mickeal Skinner as well as Dr. Tammi Klippel.  She does not have widespread systemic disease and holding systemic therapy for the time being is reasonable.   Will continue to follow her progress during hospitalization and outpatient follow-up.

## 2021-04-24 NOTE — Progress Notes (Signed)
Physical Therapy Treatment Patient Details Name: Angela Carpenter MRN: [id number] DOB: 1941-09-22 Today's Date: 04/24/2021    History of Present Illness pt is a 80 y/o female presenting to ED with worsening R sided weakness in setting of marked expansion of L frotnal hemorrhagic met with surrounding vasogenic edema. Patient now completely flaccid in RUE/RLE with dysarthria. 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    PT Comments    Pt admitted with above diagnosis. Pt was able to stand and pivot to chair with mod assist of 2.  Pt very motivated and should progress well with CIR stay prior to d/c home.  Will follow acutely.  Pt currently with functional limitations due to the deficits listed below (see PT Problem List). Pt will benefit from skilled PT to increase their independence and safety with mobility to allow discharge to the venue listed below.      Follow Up Recommendations  CIR     Equipment Recommendations  Other (comment) (TBA)    Recommendations for Other Services Rehab consult     Precautions / Restrictions Precautions Precautions: Fall Precaution Comments: R hemiplegia Restrictions Weight Bearing Restrictions: No    Mobility  Bed Mobility Overal bed mobility: Needs Assistance Bed Mobility: Supine to Sit     Supine to sit: Mod assist     General bed mobility comments: Assist to advance RLE toward EOB; able to elevate trunk +rail. Min A with use of chuck pad for anterior scoot toward EOB.    Transfers Overall transfer level: Needs assistance Equipment used: 2 person hand held assist Transfers: Sit to/from [facility] Sit to Stand: Min assist;+2 physical assistance;+2 safety/equipment Stand pivot transfers: Mod assist;+2 physical assistance;+2 safety/equipment        General transfer comment: Min A +2 for sit to stand from EOB with R knee block. Mod A +2 for stand-pivot to recliner in R with R knee block and external assist to advance RLE.  Ambulation/Gait                 Stairs             Wheelchair Mobility    Modified Rankin (Stroke Patients Only) Modified Rankin (Stroke Patients Only) Pre-Morbid Rankin Score: Slight disability Modified Rankin: Severe disability     Balance Overall balance assessment: Needs assistance Sitting-balance support: Single extremity supported;Feet supported Sitting balance-Leahy Scale: Fair Sitting balance - Comments: Min gaurd to supervision A with unilateral UE support on bed surface to maintain static sitting balance.   Standing balance support: Bilateral upper extremity supported;During functional activity Standing balance-Leahy Scale: Poor Standing balance comment: Reliant on R knee block and +2 external assist to maintain static standing balance.                            Cognition Arousal/Alertness: Awake/alert Behavior During Therapy: WFL for tasks assessed/performed Overall Cognitive Status: Within Functional Limits for tasks assessed                                 General Comments: A&Ox4; follows 1-2 step verbal commands with good accuracy; able to provide home set-up and PLOF.      Exercises      General Comments General  comments (skin integrity, edema, etc.): VSS on RA      Pertinent Vitals/Pain Pain Assessment: No/denies pain    Home Living Family/patient expects to be discharged to:: Private residence Living Arrangements: Alone Available Help at Discharge: Family;Available PRN/intermittently;Other (Comment) (Son comes and stays some nights) Type of Home: House Home Access: Stairs to enter Entrance Stairs-Rails: Left;Right;Can reach both Home Layout: Two level;Able to live on main level with bedroom/bathroom Home Equipment: Grab bars -  tub/shower;Shower seat;Cane - single point;Walker - 2 wheels;Transport chair      Prior Function Level of Independence: Independent      Comments: I with ADLs/IADLs; recent stopped driving secondary to seizure   PT Goals (current goals can now be found in the care plan section) Acute Rehab PT Goals Patient Stated Goal: To go to CIR PT Goal Formulation: With patient Time For Goal Achievement: 05/08/21 Potential to Achieve Goals: Good    Frequency    Min 4X/week      PT Plan      Co-evaluation PT/OT/SLP Co-Evaluation/Treatment: Yes Reason for Co-Treatment: Complexity of the patient's impairments (multi-system involvement);For patient/therapist safety PT goals addressed during session: Mobility/safety with mobility        AM-PAC PT "6 Clicks" Mobility   Outcome Measure  Help needed turning from your back to your side while in a flat bed without using bedrails?: A Little Help needed moving from lying on your back to sitting on the side of a flat bed without using bedrails?: A Little Help needed moving to and from a bed to a chair (including a wheelchair)?: Total Help needed standing up from a chair using your arms (e.g., wheelchair or bedside chair)?: Total Help needed to walk in hospital room?: Total Help needed climbing 3-5 steps with a railing? : Total 6 Click Score: 10    End of Session Equipment Utilized During Treatment: Gait belt Activity Tolerance: Patient limited by fatigue Patient left: in chair;with call bell/phone within reach;with chair alarm set Nurse Communication: Mobility status PT Visit Diagnosis: Unsteadiness on feet (R26.81);Other abnormalities of gait and mobility (R26.89)     Time: 0240-9735 PT Time Calculation (min) (ACUTE ONLY): 26 min  Charges:                        [facility] M,PT Acute [place] [phone] (pager)    Alvira Philips 04/24/2021, 1:58 PM

## 2021-04-24 NOTE — Progress Notes (Signed)
Inpatient Rehab Admissions Coordinator Note:   Per PT/OT recommendations, pt was screened for CIR candidacy by Shann Medal, PT, DPT.  At this time we are recommending a CIR consult and I will place an order per our protocol.  Please contact me with questions.   Shann Medal, PT, DPT [phone] 04/24/21 3:03 PM

## 2021-04-24 NOTE — Progress Notes (Signed)
OT Cancellation Note  Patient Details Name: Angela Carpenter MRN: [id number] DOB: Sep 16, 1941   Cancelled Treatment:    Reason Eval/Treat Not Completed: Active bedrest order.  Gloris [place] OTR/L Supplemental OT, Department of rehab services [phone]  Kelcy Baeten R H. 04/24/2021, 6:55 AM

## 2021-04-24 NOTE — Progress Notes (Signed)
Neurology Progress Note  Patient ID: 80 yo patient with hx metastatic melanoma to the brain w/ known hemorrhagic met L frontal lobe s/p SRS, breast cancer, a fib not on anticoagulation or ASA 2/2 hemorrhagic met admitted with worsening R sided weakness in setting of marked expansion of LF hemorrhagic met.  Subjective: - Patient admitted to ICU yesterday for close monitoring r/o active hemorrhage - CT scans stable o/n - Neurologic exam today is unchanged - No new complaints  Exam: Vitals:   04/24/21 1100 04/24/21 1200  BP: 135/68 (!) 143/79  Pulse: 84 90  Resp: (!) 21 16  Temp:    SpO2: 96% 97%   Physical Exam Gen: A&O x4, NAD HEENT: Atraumatic, normocephalic;mucous membranes moist; oropharynx clear, tongue without atrophy or fasciculations. Neck: Supple, trachea midline. Resp: CTAB, no w/r/r CV: RRR, no m/g/r; nml S1 and S2. 2+ symmetric peripheral pulses. Abd: soft/NT/ND; nabs x 4 quad Extrem: Nml bulk; no cyanosis, clubbing, or edema.   Neuro: *MS: A&O x4. Follows multi-step commands. *Speech: fluid, nondysarthric, mild WFD but able to name and repeat *CN:   I: Deferred   II,III: PERRLA, VFF by confrontation, optic discs not visualized 2/2 pupillary constriction   III,IV,VI: EOMI w/o nystagmus, no ptosis   V: Sensation intact from V1 to V3 to LT   VII: Eyelid closure was full.  R UMN facial droop   VIII: Hearing intact to voice   IX,X: Voice normal, palate elevates symmetrically   XI: SCM/trap 5/5 bilat   XII: Tongue protrudes midline, no atrophy or fasciculations *Motor:   Normal bulk.  No tremor, rigidity or bradykinesia. Flaccid paralysis of RUE and RLE. L side full strength throughout. *Sensory: Intact to light touch, pinprick, temperature vibration throughout. Symmetric. Propioception intact bilat.  No double-simultaneous extinction. *Coordination:  FNF intact on L. *Reflexes:  2+ more brisk on R, toes mute LUE upgoing R *Gait: UTA 2/2 weakness     Premorbid mRS  = 3   ICH score = 0  Impression: 80 yo patient with hx metastatic melanoma to the brain w/ known hemorrhagic met L frontal lobe s/p SRS, breast cancer, a fib not on anticoagulation or ASA 2/2 hemorrhagic met admitted with worsening R sided weakness in setting of marked expansion of LF hemorrhagic met.  She has always had hyperintensity on head CT 2/2 petechial hemorrhage there but given her rapid worsening over the weekend and appearance on personal review of the imaging I was concerned that her worsening was due at least in part to further hemorrhage into the met. Fortunately her scans were completely stable overnight and she does not appear to be actively hemorrhaging at this time.  Recommendations:  # Significant expansion of L frontal hemorrhagic metastasis (melanoma) with marked vasogenic edema - Plan to transfer from MICU to progressive unit today - BP under control without prns. Goal systolic <782, prn hydralazine if needed - In the interest of avoiding unnecessary radiation to the met will hold further scheduled CT scans and follow her clinically - STAT head CT for any change in mental status or neurologic exam - Decadron 80m q12 hrs - No AC or ASA. SCDs for DVT prophylaxis - Continue home prophylactic keppra - PT/OT - Neuro-onc consulted; case d/w Dr. MTammi Klippelby phone. He will see patient as well as d/w o/p oncologist and also NSU to see if any role for surgical intervention. Appreciate assistance. - I will hold kBeryle Flockuntil they weigh in given potential worsening of hemorrhage in setting of  Beryle Flock.   # A fib - No AC or ASA. SCDs for DVT prophylaxis  Su Monks, MD [facility] [phone]  If 7pm- 7am, please page neurology on call as listed in [place].

## 2021-04-24 NOTE — Progress Notes (Signed)
SLP Cancellation Note  Patient Details Name: Angela Carpenter MRN: [id number] DOB: [birth year]   Cancelled treatment:       Reason Eval/Treat Not Completed: Other (comment) (Pt currently working with PT, will follow up for completion of SLE)  [place], [place]   04/24/2021, 11:55 AM

## 2021-04-24 NOTE — ED Notes (Signed)
Report given to [facility] Nurse

## 2021-04-25 ENCOUNTER — Other Ambulatory Visit: Payer: Self-pay | Admitting: Radiation Therapy

## 2021-04-25 DIAGNOSIS — I611 Nontraumatic intracerebral hemorrhage in hemisphere, cortical: Secondary | ICD-10-CM | POA: Diagnosis not present

## 2021-04-25 DIAGNOSIS — I10 Essential (primary) hypertension: Secondary | ICD-10-CM

## 2021-04-25 DIAGNOSIS — G8190 Hemiplegia, unspecified affecting unspecified side: Secondary | ICD-10-CM | POA: Diagnosis not present

## 2021-04-25 DIAGNOSIS — C7931 Secondary malignant neoplasm of brain: Secondary | ICD-10-CM | POA: Diagnosis not present

## 2021-04-25 LAB — URINE CULTURE: Culture: NO GROWTH

## 2021-04-25 NOTE — Progress Notes (Signed)
Inpatient Rehabilitation Admissions Coordinator   Inpatient rehab consult received, I met at bedside with patient for rehab assessment and discussions. We await neurosurgery consult. Rehab venue options can not be finalized until plan from neurosurgery determined. We will follow at a distance for now.  Barbara Boyette, RN, MSN Rehab Admissions Coordinator [phone] 04/25/2021 4:39 PM  

## 2021-04-25 NOTE — Progress Notes (Signed)
Physical Therapy Treatment Patient Details Name: Angela Carpenter MRN: [id number] DOB: October 29, 1940 Today's Date: 04/25/2021    History of Present Illness pt is a 79 y/o female presenting to ED with worsening R sided weakness in setting of marked expansion of L frotnal hemorrhagic met with surrounding vasogenic edema. Patient now completely flaccid in RUE/RLE with dysarthria. 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    PT Comments    Pt tolerated treatment well today. Pt demonstrated significant posterior lean in standing and difficulty with weight bearing on RLE, contributing to decrease in function with transfers. Pt would benefit from continuation of PT to improve bed mobility, transfers, and function. Recommend progressing to weight shifting in standing position to allow for greater weight bearing on R.  Follow Up Recommendations  CIR     Equipment Recommendations  Other (comment) (TBA)    Recommendations for Other Services Rehab consult     Precautions / Restrictions Precautions Precautions: Fall Precaution Comments: R hemiplegia Restrictions Weight Bearing Restrictions: No RLE Weight Bearing: Weight bearing as tolerated    Mobility  Bed Mobility Overal bed mobility: Needs Assistance Bed Mobility: Supine to Sit     Supine to sit: Mod assist     General bed mobility comments: Pt in bed upon arrival. mod A to advance RLE toward EOB and scoot toward EOB. Pt able to elevate trunk and push up to sitting position.    Transfers Overall transfer level: Needs assistance Equipment used: Quad cane Transfers: Sit to/from [facility] Sit to Stand: Mod assist;+2 physical assistance;+2 safety/equipment Stand pivot transfers: Mod assist;+2 physical assistance;+2 safety/equipment        General transfer comment: Mod A +2 for sit to stand with R knee block and use of quad cane. Pt demonstrated posterior lean in standing. Mod A +2 for stand pivot to recliner with R knee block and external assist to advance RLE and use of quad cane. Pt favors LLE, unable to perform weight shift toward R. Verbal cueing provided for transfers  Ambulation/Gait                 Stairs             Wheelchair Mobility    Modified Rankin (Stroke Patients Only)       Balance Overall balance assessment: Needs assistance Sitting-balance support: Feet supported;Single extremity supported Sitting balance-Leahy Scale: Fair Sitting balance - Comments: Min guard for safety with UE support on L to maintain sitting balance Postural control: Posterior lean Standing balance support: Single extremity supported Standing balance-Leahy Scale: Poor Standing balance comment: Pt demonstrated a posterior lean with mod A +2  to maintain upright position in standing and R knee block.                            Cognition Arousal/Alertness: Awake/alert Behavior During Therapy: WFL for tasks assessed/performed Overall Cognitive Status: Within Functional Limits for tasks assessed                                 General Comments: Pt accurately followed commands. Having frustration prior to today's sessions, which greatly affected her mood.      Exercises General Exercises - Lower Extremity Long Arc Quad:  AAROM;Right;Seated (Trace contraction w/ 2 reps)    General Comments General comments (skin integrity, edema, etc.): VSS      Pertinent Vitals/Pain  VSS    Home Living                      Prior Function            PT Goals (current goals can now be found in the care plan section) Acute Rehab PT Goals Patient Stated Goal: To go to CIR PT Goal Formulation: With patient Time For Goal Achievement: 05/08/21 Potential to Achieve Goals: Good Progress  towards PT goals: Progressing toward goals    Frequency    Min 4X/week      PT Plan Current plan remains appropriate    Co-evaluation              AM-PAC PT "6 Clicks" Mobility   Outcome Measure  Help needed turning from your back to your side while in a flat bed without using bedrails?: A Little Help needed moving from lying on your back to sitting on the side of a flat bed without using bedrails?: A Little Help needed moving to and from a bed to a chair (including a wheelchair)?: Total Help needed standing up from a chair using your arms (e.g., wheelchair or bedside chair)?: Total Help needed to walk in hospital room?: Total Help needed climbing 3-5 steps with a railing? : Total 6 Click Score: 10    End of Session Equipment Utilized During Treatment: Gait belt Activity Tolerance: Patient tolerated treatment well Patient left: in chair;with call bell/phone within reach;with chair alarm set Nurse Communication: Mobility status PT Visit Diagnosis: Unsteadiness on feet (R26.81);Other abnormalities of gait and mobility (R26.89)     Time: 3953-2023 PT Time Calculation (min) (ACUTE ONLY): 27 min  Charges:  $Therapeutic Activity: 23-37 mins                     Louie Casa, SPT Acute Rehab: [phone]    Domingo Dimes 04/25/2021, 11:20 AM

## 2021-04-25 NOTE — Progress Notes (Signed)
PROGRESS NOTE    Angela Carpenter  CWC:[id number] DOB: Oct 01, 1941 DOA: 04/23/2021 PCP: Haywood Pao, MD   Brief Narrative:  80 yo patient with hx metastatic melanoma to the brain w/ known hemorrhagic met L frontal lobe s/p SRS, breast cancer, a fib not on anticoagulation or ASA 2/2 hemorrhagic met admitted with worsening R sided weakness in setting of marked expansion of LF hemorrhagic met.  Patient was transferred under [place] on 04/25/2021.  Assessment & Plan:   Active Problems:   ICH (intracerebral hemorrhage) (HCC)  Intracranial hemorrhagic metastasis, primary mucosal melanoma/fall: Presented with right-sided weakness.  Still has flaccid paralysis in right upper and lower extremity.  Initially admitted under neurology, neurology had consulted neurooncology as well as neurosurgery and transferred under hospitalist service.  Awaiting further recommendations from neurosurgery.  Further management by neuro-oncology and neurology as well.  Continue dexamethasone and Keppra.  Hyperlipidemia: Continue atorvastatin.  Hypertension: Controlled.  Continue Toprol-XL.  DVT prophylaxis: SCD's Start: 04/23/21 1323   Code Status: DNR  Family Communication:  None present at bedside.  Plan of care discussed with patient in length and he verbalized understanding and agreed with it.  Status is: Inpatient  Remains inpatient appropriate because:Ongoing diagnostic testing needed not appropriate for outpatient work up  Dispo: The patient is from: Home              Anticipated d/c is to: Home              Patient currently is not medically stable to d/c.   Difficult to place patient No        Estimated body mass index is 24.69 kg/m as calculated from the following:   Height as of this encounter: 5' 5" (1.651 m).   Weight as of this encounter: 67.3 kg.      Nutritional status:               Consultants:  Neurology Neuro-oncology Oncology Neurosurgery  Procedures:   None  Antimicrobials:  Anti-infectives (From admission, onward)    Start     Dose/Rate Route Frequency Ordered Stop   04/24/21 1330  acyclovir (ZOVIRAX) tablet 400 mg        400 mg Oral 2 times daily 04/24/21 1233            Subjective: Seen and examined this morning.  She has no new complaint.  Continues to have flaccid paralysis in the right upper and lower extremity.  Objective: Vitals:   04/25/21 1200 04/25/21 1300 04/25/21 1400 04/25/21 1500  BP: 127/76 123/71 113/70 103/68  Pulse: 72 76 77 71  Resp: 18 19 (!) 26 18  Temp:      TempSrc:      SpO2: 98% 98% 97% 97%  Weight:      Height:        Intake/Output Summary (Last 24 hours) at 04/25/2021 1606 Last data filed at 04/25/2021 0900 Gross per 24 hour  Intake 450 ml  Output 1700 ml  Net -1250 ml   Filed Weights   04/23/21 1038 04/24/21 0604  Weight: 68 kg 67.3 kg    Examination:  General exam: Appears calm and comfortable  Respiratory system: Clear to auscultation. Respiratory effort normal. Cardiovascular system: S1 & S2 heard, RRR. No JVD, murmurs, rubs, gallops or clicks. No pedal edema. Gastrointestinal system: Abdomen is nondistended, soft and nontender. No organomegaly or masses felt. Normal bowel sounds heard. Central nervous system: Alert and oriented.  Flaccid paralysis right upper and lower  extremity. Skin: No rashes, lesions or ulcers Psychiatry: Judgement and insight appear normal. Mood & affect appropriate.    Data Reviewed: I have personally reviewed following labs and imaging studies  CBC: Recent Labs  Lab 04/23/21 1315  WBC 22.1*  NEUTROABS 17.3*  HGB 15.4*  HCT 47.3*  MCV 97.3  PLT 778   Basic Metabolic Panel: Recent Labs  Lab 04/23/21 1315 04/24/21 0824  NA 135 136  K 3.4* 3.9  CL 98 101  CO2 28 24  GLUCOSE 105* 172*  BUN 25* 22  CREATININE 0.62 0.59  CALCIUM 9.0 9.1  MG 2.4  --   PHOS 3.3  --    GFR: Estimated Creatinine Clearance: 53 mL/min (by C-G formula based  on SCr of 0.59 mg/dL). Liver Function Tests: Recent Labs  Lab 04/23/21 1315  AST 24  ALT 42  ALKPHOS 61  BILITOT 1.0  PROT 6.5  ALBUMIN 3.8   No results for input(s): LIPASE, AMYLASE in the last 168 hours. No results for input(s): AMMONIA in the last 168 hours. Coagulation Profile: Recent Labs  Lab 04/23/21 1315  INR 1.0   Cardiac Enzymes: No results for input(s): CKTOTAL, CKMB, CKMBINDEX, TROPONINI in the last 168 hours. BNP (last 3 results) No results for input(s): PROBNP in the last 8760 hours. HbA1C: No results for input(s): HGBA1C in the last 72 hours. CBG: Recent Labs  Lab 04/24/21 0603  GLUCAP 148*   Lipid Profile: No results for input(s): CHOL, HDL, LDLCALC, TRIG, CHOLHDL, LDLDIRECT in the last 72 hours. Thyroid Function Tests: No results for input(s): TSH, T4TOTAL, FREET4, T3FREE, THYROIDAB in the last 72 hours. Anemia Panel: No results for input(s): VITAMINB12, FOLATE, FERRITIN, TIBC, IRON, RETICCTPCT in the last 72 hours. Sepsis Labs: No results for input(s): PROCALCITON, LATICACIDVEN in the last 168 hours.  Recent Results (from the past 240 hour(s))  Resp Panel by RT-PCR (Flu A&B, Covid) Nasopharyngeal Swab     Status: None   Collection Time: 04/23/21  1:28 PM   Specimen: Nasopharyngeal Swab; Nasopharyngeal(NP) swabs in vial transport medium  Result Value Ref Range Status   SARS Coronavirus 2 by RT PCR NEGATIVE NEGATIVE Final    Comment: (NOTE) SARS-CoV-2 target nucleic acids are NOT DETECTED.  The SARS-CoV-2 RNA is generally detectable in upper respiratory specimens during the acute phase of infection. The lowest concentration of SARS-CoV-2 viral copies this assay can detect is 138 copies/mL. A negative result does not preclude SARS-Cov-2 infection and should not be used as the sole basis for treatment or other patient management decisions. A negative result may occur with  improper specimen collection/handling, submission of specimen other than  nasopharyngeal swab, presence of viral mutation(s) within the areas targeted by this assay, and inadequate number of viral copies(<138 copies/mL). A negative result must be combined with clinical observations, patient history, and epidemiological information. The expected result is Negative.  Fact Sheet for Patients:  EntrepreneurPulse.com.au  Fact Sheet for Healthcare Providers:  IncredibleEmployment.be  This test is no t yet approved or cleared by the [place] FDA and  has been authorized for detection and/or diagnosis of SARS-CoV-2 by FDA under an Emergency Use Authorization (EUA). This EUA will remain  in effect (meaning this test can be used) for the duration of the COVID-19 declaration under Section 564(b)(1) of the Act, 21 U.S.C.section 360bbb-3(b)(1), unless the authorization is terminated  or revoked sooner.       Influenza A by PCR NEGATIVE NEGATIVE Final   Influenza B by PCR NEGATIVE  NEGATIVE Final    Comment: (NOTE) The Xpert Xpress SARS-CoV-2/FLU/RSV plus assay is intended as an aid in the diagnosis of influenza from Nasopharyngeal swab specimens and should not be used as a sole basis for treatment. Nasal washings and aspirates are unacceptable for Xpert Xpress SARS-CoV-2/FLU/RSV testing.  Fact Sheet for Patients: EntrepreneurPulse.com.au  Fact Sheet for Healthcare Providers: IncredibleEmployment.be  This test is not yet approved or cleared by the [place] FDA and has been authorized for detection and/or diagnosis of SARS-CoV-2 by FDA under an Emergency Use Authorization (EUA). This EUA will remain in effect (meaning this test can be used) for the duration of the COVID-19 declaration under Section 564(b)(1) of the Act, 21 U.S.C. section 360bbb-3(b)(1), unless the authorization is terminated or revoked.  Performed at [facility], [place] [address] [place], [place] [address]    MRSA Next Gen by PCR, Nasal     Status: None   Collection Time: 04/24/21  6:06 AM   Specimen: Nasal Mucosa; Nasal Swab  Result Value Ref Range Status   MRSA by PCR Next Gen NOT DETECTED NOT DETECTED Final    Comment: (NOTE) The GeneXpert MRSA Assay (FDA approved for NASAL specimens only), is one component of a comprehensive MRSA colonization surveillance program. It is not intended to diagnose MRSA infection nor to guide or monitor treatment for MRSA infections. Test performance is not FDA approved in patients less than 47 years old. Performed at [facility], [place] [address] [place], [place] [address]   Urine Culture     Status: None   Collection Time: 04/24/21  8:53 AM   Specimen: Urine, Clean Catch  Result Value Ref Range Status   Specimen Description URINE, CLEAN CATCH  Final   Special Requests NONE  Final   Culture   Final    NO GROWTH Performed at [facility], [place] [address], [place], [place] [address]    Report Status 04/25/2021 FINAL  Final      Radiology Studies: CT HEAD WO CONTRAST  Result Date: 04/24/2021 CLINICAL DATA:  Hemorrhage follow-up EXAM: CT HEAD WITHOUT CONTRAST TECHNIQUE: Contiguous axial images were obtained from the base of the skull through the vertex without intravenous contrast. COMPARISON:  04/23/2021 FINDINGS: Brain: Unchanged size of intraparenchymal hematoma centered in the superior left frontal lobe with surrounding vasogenic edema. No midline shift or other mass effect. Old infarct of the left caudate head. Vascular: No abnormal hyperdensity of the major intracranial arteries or dural venous sinuses. No intracranial atherosclerosis. Skull: The visualized skull base, calvarium and extracranial soft tissues are normal. Sinuses/Orbits: No fluid levels or advanced mucosal thickening of the visualized paranasal sinuses. No mastoid or middle ear effusion. The orbits are normal. IMPRESSION: Unchanged size of intraparenchymal hematoma centered in  the superior left frontal lobe with surrounding vasogenic edema. Electronically Signed   By: Ulyses Jarred M.D.   On: 04/24/2021 02:59   CT HEAD WO CONTRAST  Result Date: 04/23/2021 CLINICAL DATA:  Follow-up parenchymal hemorrhage. EXAM: CT HEAD WITHOUT CONTRAST TECHNIQUE: Contiguous axial images were obtained from the base of the skull through the vertex without intravenous contrast. COMPARISON:  Head CT and brain MRI earlier today. FINDINGS: Brain: No significant change in the hemorrhagic left frontal lobe lesion measuring 2.9 x 2.3 cm. Stable degree of surrounding vasogenic edema with localized mass effect, including the frontal horn of the left lateral ventricle. There is otherwise no significant change from exams earlier today. No evidence of new lesion or  hemorrhage. No subdural collection. No hydrocephalus. Vascular: No hyperdense vessel. Skull: No fracture or focal lesion. Sinuses/Orbits: Stable lung mucosal thickening and opacification of scattered left ethmoid air cells. Right cataract resection. Other: No mastoid effusion. IMPRESSION: 1. No significant change in hemorrhagic left frontal lobe lesion with surrounding vasogenic edema and localized mass effect. 2. No new abnormality from exams earlier today. Electronically Signed   By: Keith Rake M.D.   On: 04/23/2021 19:17    Scheduled Meds:   stroke: mapping our early stages of recovery book   Does not apply Once   acyclovir  400 mg Oral BID   atorvastatin  10 mg Oral Daily   Chlorhexidine Gluconate Cloth  6 each Topical Daily   dexamethasone  8 mg Oral Q12H   levETIRAcetam  500 mg Oral BID   metoprolol succinate  25 mg Oral BID   pantoprazole (PROTONIX) IV  40 mg Intravenous QHS   raloxifene  60 mg Oral q AM   senna-docusate  1 tablet Oral BID   Continuous Infusions:  clevidipine       LOS: 2 days   Time spent: 34 minutes   Darliss Cheney, MD [facility]  04/25/2021, 4:06 PM   How to contact the [facility] Attending or  Consulting provider [place] or covering provider during after hours [place], for this patient?  Check the care team in [facility] and look for a) attending/consulting [facility] provider listed and b) the [facility] team listed. Page or secure chat [phone]. Log into [email or web address] and use [place]'s universal password to access. If you do not have the password, please contact the hospital operator. Locate the [facility] provider you are looking for under [facility] and page to a number that you can be directly reached. If you still have difficulty reaching the provider, please page the [facility] (Director on Call) for the Hospitalists listed on amion for assistance.

## 2021-04-25 NOTE — Progress Notes (Signed)
Neurology Progress Note  Patient ID: 80 yo patient with hx metastatic melanoma to the brain w/ known hemorrhagic met L frontal lobe s/p SRS, breast cancer, a fib not on anticoagulation or ASA 2/2 hemorrhagic met admitted with worsening R sided weakness in setting of marked expansion of LF hemorrhagic met.  Subjective: - CT scans stable first 24 hrs, now following clinically - Neurologic exam today is unchanged - No new complaints  Interim events: - Neurosurgery saw patient this AM and recommended surgical intervention. She has discussed this with her son and would like to proceed with surgery.  Exam: Vitals:   04/25/21 1700 04/25/21 1800  BP: 107/64 110/64  Pulse:  70  Resp: 18 (!) 29  Temp:    SpO2:  98%   Physical Exam Gen: A&O x4, NAD HEENT: Atraumatic, normocephalic;mucous membranes moist; oropharynx clear, tongue without atrophy or fasciculations. Neck: Supple, trachea midline. Resp: CTAB, no w/r/r CV: RRR, no m/g/r; nml S1 and S2. 2+ symmetric peripheral pulses. Abd: soft/NT/ND; nabs x 4 quad Extrem: Nml bulk; no cyanosis, clubbing, or edema.   Neuro: *MS: A&O x4. Follows multi-step commands. *Speech: fluid, nondysarthric, mild WFD but able to name and repeat *CN:   I: Deferred   II,III: PERRLA, VFF by confrontation, optic discs not visualized 2/2 pupillary constriction   III,IV,VI: EOMI w/o nystagmus, no ptosis   V: Sensation intact from V1 to V3 to LT   VII: Eyelid closure was full.  R UMN facial droop   VIII: Hearing intact to voice   IX,X: Voice normal, palate elevates symmetrically   XI: SCM/trap 5/5 bilat   XII: Tongue protrudes midline, no atrophy or fasciculations *Motor:   Normal bulk.  No tremor, rigidity or bradykinesia. Flaccid paralysis of RUE and RLE. L side full strength throughout. *Sensory: Intact to light touch, pinprick, temperature vibration throughout. Symmetric. Propioception intact bilat.  No double-simultaneous extinction. *Coordination:  FNF  intact on L. *Reflexes:  2+ more brisk on R, toes mute LUE upgoing R *Gait: UTA 2/2 weakness     Premorbid mRS = 3   ICH score = 0  Impression: 80 yo patient with hx metastatic melanoma to the brain w/ known hemorrhagic met L frontal lobe s/p SRS, breast cancer, a fib not on anticoagulation or ASA 2/2 hemorrhagic met admitted with worsening R sided weakness in setting of marked expansion of LF hemorrhagic met.  She has always had hyperintensity on head CT 2/2 petechial hemorrhage there but given her rapid worsening over the weekend and appearance on personal review of the imaging I was concerned that her worsening was due at least in part to further hemorrhage into the met. Fortunately her scans were completely stable overnight and she does not appear to be actively hemorrhaging at this time. Neurosurgery has recommended surgical intervention, patient will discuss with her son tonight and decide.  Recommendations:  # Significant expansion of L frontal hemorrhagic metastasis (melanoma) with marked vasogenic edema - BP under control without prns. Goal systolic <354, prn hydralazine if needed - In the interest of avoiding unnecessary radiation to the met will hold further scheduled CT scans and follow her clinically - STAT head CT for any change in mental status or neurologic exam - Decadron 88m q12 hrs - No AC or ASA. SCDs for DVT prophylaxis - Continue home prophylactic keppra - PT/OT - Neuro-onc and neurosurgery are consulting.  - Neurosurgery saw patient this AM and recommended surgical intervention. She has discussed this with her son and would  like to proceed with surgery. They will await further information on neurosurgery regarding when this can be scheduled. - Holding [place]   # A fib - No AC or ASA. SCDs for DVT prophylaxis  Su Monks, MD [facility] [phone]  If 7pm- 7am, please page neurology on call as listed in Agency.

## 2021-04-25 NOTE — Plan of Care (Signed)
  Problem: Education: Goal: Knowledge of disease or condition will improve Outcome: Progressing Goal: Knowledge of secondary prevention will improve Outcome: Progressing Goal: Knowledge of patient specific risk factors addressed and post discharge goals established will improve Outcome: Progressing Goal: Individualized Educational Video(s) Outcome: Progressing   Problem: Nutrition: Goal: Dietary intake will improve 04/25/2021 1519 by Donnita Falls, RN Outcome: Progressing 04/25/2021 1518 by Donnita Falls, RN Outcome: Progressing   Problem: Education: Goal: Knowledge of disease or condition will improve Outcome: Progressing Goal: Knowledge of secondary prevention will improve Outcome: Progressing Goal: Knowledge of patient specific risk factors addressed and post discharge goals established will improve Outcome: Progressing Goal: Individualized Educational Video(s) Outcome: Progressing   Problem: Education: Goal: Knowledge of General Education information will improve Description: Including pain rating scale, medication(s)/side effects and non-pharmacologic comfort measures Outcome: Progressing   Problem: Health Behavior/Discharge Planning: Goal: Ability to manage health-related needs will improve Outcome: Progressing   Problem: Clinical Measurements: Goal: Ability to maintain clinical measurements within normal limits will improve Outcome: Progressing Goal: Will remain free from infection Outcome: Progressing Goal: Cardiovascular complication will be avoided Outcome: Progressing   Problem: Activity: Goal: Risk for activity intolerance will decrease Outcome: Progressing   Problem: Nutrition: Goal: Adequate nutrition will be maintained Outcome: Progressing

## 2021-04-26 ENCOUNTER — Encounter: Payer: Self-pay | Admitting: Radiation Therapy

## 2021-04-26 ENCOUNTER — Other Ambulatory Visit: Payer: Self-pay | Admitting: Oncology

## 2021-04-26 ENCOUNTER — Telehealth: Payer: Self-pay | Admitting: Oncology

## 2021-04-26 ENCOUNTER — Encounter: Payer: Self-pay | Admitting: *Deleted

## 2021-04-26 ENCOUNTER — Other Ambulatory Visit: Payer: Self-pay | Admitting: Neurosurgery

## 2021-04-26 DIAGNOSIS — I10 Essential (primary) hypertension: Secondary | ICD-10-CM | POA: Diagnosis not present

## 2021-04-26 LAB — CBC WITH DIFFERENTIAL/PLATELET
Abs Immature Granulocytes: 0.47 10*3/uL — ABNORMAL HIGH (ref 0.00–0.07)
Basophils Absolute: 0.1 10*3/uL (ref 0.0–0.1)
Basophils Relative: 0 %
Eosinophils Absolute: 0 10*3/uL (ref 0.0–0.5)
Eosinophils Relative: 0 %
HCT: 44.7 % (ref 36.0–46.0)
Hemoglobin: 14.8 g/dL (ref 12.0–15.0)
Immature Granulocytes: 2 %
Lymphocytes Relative: 6 %
Lymphs Abs: 1.2 10*3/uL (ref 0.7–4.0)
MCH: 31.6 pg (ref 26.0–34.0)
MCHC: 33.1 g/dL (ref 30.0–36.0)
MCV: 95.3 fL (ref 80.0–100.0)
Monocytes Absolute: 0.8 10*3/uL (ref 0.1–1.0)
Monocytes Relative: 4 %
Neutro Abs: 16.9 10*3/uL — ABNORMAL HIGH (ref 1.7–7.7)
Neutrophils Relative %: 88 %
Platelets: 266 10*3/uL (ref 150–400)
RBC: 4.69 MIL/uL (ref 3.87–5.11)
RDW: 14.2 % (ref 11.5–15.5)
WBC: 19.4 10*3/uL — ABNORMAL HIGH (ref 4.0–10.5)
nRBC: 0 % (ref 0.0–0.2)

## 2021-04-26 LAB — BASIC METABOLIC PANEL
Anion gap: 10 (ref 5–15)
BUN: 25 mg/dL — ABNORMAL HIGH (ref 8–23)
CO2: 25 mmol/L (ref 22–32)
Calcium: 9.1 mg/dL (ref 8.9–10.3)
Chloride: 101 mmol/L (ref 98–111)
Creatinine, Ser: 0.63 mg/dL (ref 0.44–1.00)
GFR, Estimated: >60 mL/min (ref >60)
Glucose, Bld: 115 mg/dL — ABNORMAL HIGH (ref 70–99)
Potassium: 3.9 mmol/L (ref 3.5–5.1)
Sodium: 136 mmol/L (ref 135–145)

## 2021-04-26 LAB — MAGNESIUM: Magnesium: 2.3 mg/dL (ref 1.7–2.4)

## 2021-04-26 MED ORDER — PANTOPRAZOLE SODIUM 40 MG PO TBEC
40.0000 mg | DELAYED_RELEASE_TABLET | Freq: Every day | ORAL | Status: DC
  Administered 2021-04-26 – 2021-05-02 (×7): 40 mg via ORAL
  Filled 2021-04-26 (×7): qty 1

## 2021-04-26 NOTE — Progress Notes (Signed)
  NEUROSURGERY PROGRESS NOTE   Pt known to me from previous [place] treatment to left frontal melanoma metastasis. She has had mild right-sided weakness over the last approximately 1 to 2 months however rapid exacerbation to the point of essentially right hemiplegia over the last several days.  She was therefore admitted to the hospital where follow-up imaging has demonstrated hemorrhagic transformation of the left frontal metastasis with some enlargement.  Patient does also complain of some mild word finding difficulties.  EXAM:  BP 122/73   Pulse 76   Temp 97.8 F (36.6 C) (Oral)   Resp 16   Ht 5\' 5"  (1.651 m)   Wt 67.3 kg   SpO2 97%   BMI 24.69 kg/m   Awake, alert, oriented  Speech fluent, appropriate  CN grossly intact  5/5 LUE/LLE 0/5 RUE/RLE  IMAGING: MRI of the brain with and without contrast was personally reviewed.  This demonstrates the left posterior frontal metastasis with significant hemorrhagic transformation.  There is peripheral enhancement.  The lesion now measures approximately 3 cm in maximal dimension.  There is a significant amount of edema surrounding the lesion.  The lesion appears to be centered just anterior to the precentral gyrus likely in the supplemental motor area.  IMPRESSION:  80 y.o. female with metastatic melanoma and SRS treatment to a posterior left frontal lesion with enlargement and hemorrhagic transformation and associated right hemiplegia.  I have reviewed the situation with the patient as well as her oncologist, Dr. Alen Blew and radiation oncology Dr. Tammi Klippel.  We are all in agreement that the patient has largely well-controlled systemic disease, was relatively functional prior to the symptomatic nature of this lesion, and is otherwise reasonably healthy.  As this lesion has already been treated with SRS I do believe that surgical resection offers the best chance for control of this lesion.  PLAN: -We will plan on proceeding with stereotactic left  frontal craniotomy for resection of the metastatic tumor. Surgery scheduled on 05/01/21 approximately 1p.  I have reviewed the above with the patient and her sister-in-law at bedside.  Patient indicates to me that she has spoken with her son and that she too is in agreement that surgery is her best option.  All her questions were answered today.   Consuella Lose, MD [facility]

## 2021-04-26 NOTE — Progress Notes (Signed)
Events in the last few days noted.  Her case was discussed extensively with radiation oncology as well as neurosurgery and the general consensus is to proceed with surgical resection for better disease control.  Based on these findings, I will suspend immunotherapy for the time being till her surgery is complete and she has recovered.  Her PET scan scheduled for July 29 will be postponed for the time being.  This was communicated with her son [name] and all questions were answered.

## 2021-04-26 NOTE — Progress Notes (Signed)
Physical Therapy Treatment Patient Details Name: Angela Carpenter MRN: [id number] DOB: 02/18/1941 Today's Date: 04/26/2021    History of Present Illness pt is a 79 y/o female presenting to ED with worsening R sided weakness in setting of marked expansion of L frotnal hemorrhagic met with surrounding vasogenic edema. Patient now completely flaccid in RUE/RLE with dysarthria. Neurosurgery following for surgical intervention. 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    PT Comments    Patient up in chair at beginning and end of session. Session focused on recruiting RLE in weight-bearing positions--pt able to progress to doing mini-squats with bil LEs, assisted wt-shift onto RLE with advancing LLE, and even advancing RLE with assist (pt not able to pull RLE back after advancing it). Patient with difficulty understanding how to use the hemiwalker during standing activities as she initially was pulling on it (vs pushing down) and pulling it too close into her side, thus blocking her LLE from being able to advance.     Follow Up Recommendations  CIR     Equipment Recommendations  Other (comment) (TBA)    Recommendations for Other Services Rehab consult     Precautions / Restrictions Precautions Precautions: Fall Precaution Comments: R hemiplegia Restrictions Weight Bearing Restrictions: No    Mobility  Bed Mobility Overal bed mobility: Needs Assistance Bed Mobility: Supine to Sit     Supine to sit: Mod assist     General bed mobility comments: Pt in bed upon arrival. mod A to advance RLE toward EOB and scoot toward EOB. Pt able to elevate trunk and push up to sitting position with use of LUE.    Transfers Overall transfer level: Needs assistance Equipment used: Hemi-walker Transfers: Sit to/from Stand Sit  to Stand: Mod assist;+2 physical assistance;+2 safety/equipment Stand pivot transfers: Mod assist;+2 physical assistance;+2 safety/equipment       General transfer comment: Mod A +2 for sit to stand with R knee block and facilitating anterior wt-shift over her BOS; required cues each trial (x 3) for proper sequencing and especially with moving left hand from armrest to hemiwalker  Ambulation/Gait             General Gait Details: pt initially with posterior bias, eventually able to maintain upright/midline with minguard assist (including support at knee--pt maintaining knee extension (not hyperextension) with guarding at knee; progressed to mini-squats with bil LEs with good Rt knee control (x 10 reps); progressed to pre-gait weight-shifting onto RLE and stepping forward/backward with LLE--pt requires mod assist to wt-shift over her RLE prior to advancing LLE   Stairs             Wheelchair Mobility    Modified Rankin (Stroke Patients Only) Modified Rankin (Stroke Patients Only) Pre-Morbid Rankin Score: Slight disability Modified Rankin: Severe disability     Balance Overall balance assessment: Needs assistance Sitting-balance support: Feet supported;Single extremity supported Sitting balance-Leahy Scale: Fair Sitting balance - Comments: Min guard to supervision A to maintain static sitting balance at EOB with unilateral UE support on bed surface. Postural control: Posterior lean Standing balance support: Single extremity supported Standing balance-Leahy Scale: Poor Standing balance comment: Reliant on +2 assist to maintain static standing balance.                              Cognition Arousal/Alertness: Awake/alert Behavior During Therapy: WFL for tasks assessed/performed Overall Cognitive Status: Within Functional Limits for tasks assessed                                 General Comments: some difficulty following instructions for  wt-shifting and stepping (even with visual demonstration)      Exercises Other Exercises Other Exercises: Self-ROM with use of LUE x10 reps each.    General Comments General comments (skin integrity, edema, etc.): pt was confused by use of RLE or LLE; also confused by "weak leg vs strong leg"      Pertinent Vitals/Pain Pain Assessment: No/denies pain    Home Living     Available Help at Discharge: Family Type of Home: House              Prior Function            PT Goals (current goals can now be found in the care plan section) Acute Rehab PT Goals Patient Stated Goal: To go to CIR Time For Goal Achievement: 05/08/21 Potential to Achieve Goals: Good Progress towards PT goals: Progressing toward goals    Frequency    Min 4X/week      PT Plan Current plan remains appropriate    Co-evaluation              AM-PAC PT "6 Clicks" Mobility   Outcome Measure  Help needed turning from your back to your side while in a flat bed without using bedrails?: A Little Help needed moving from lying on your back to sitting on the side of a flat bed without using bedrails?: A Little Help needed moving to and from a bed to a chair (including a wheelchair)?: Total Help needed standing up from a chair using your arms (e.g., wheelchair or bedside chair)?: Total Help needed to walk in hospital room?: Total Help needed climbing 3-5 steps with a railing? : Total 6 Click Score: 10    End of Session Equipment Utilized During Treatment: Gait belt Activity Tolerance: Patient tolerated treatment well Patient left: in chair;with call bell/phone within reach;with chair alarm set Nurse Communication: Mobility status PT Visit Diagnosis: Unsteadiness on feet (R26.81);Other abnormalities of gait and mobility (R26.89)     Time: 1329-1356 PT Time Calculation (min) (ACUTE ONLY): 27 min  Charges:  $Neuromuscular Re-education: 23-37 mins                      Lynn S, PT Pager  [phone]    Lynn P Sasser 04/26/2021, 2:27 PM 

## 2021-04-26 NOTE — Progress Notes (Signed)
Occupational Therapy Treatment Patient Details Name: Angela Carpenter MRN: [id number] DOB: July 08, 1941 Today's Date: 04/26/2021    History of present illness pt is a 80 y/o female presenting to ED with worsening R sided weakness in setting of marked expansion of L frotnal hemorrhagic met with surrounding vasogenic edema. Patient now completely flaccid in RUE/RLE with dysarthria. Neurosurgery following for surgical intervention. 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   OT comments  OT treatment session with focus on RUE NMR, ADL transfers and self-care re-education. Patient demonstrates good insight into current deficits with continued motivation to maximize independence. Patient progressed from bed to chair with +2 assist and use of hemi walker. Continued RUE flaccidity noted with mild tightness at elbow and shoulder. Education provided on position and self-ROM with handout to be provided at time of next treatment session. OT will continue to follow acutely. Will assess need for splinting at time of next treatment session.    Follow Up Recommendations  CIR    Equipment Recommendations  Other (comment) (Defer to next level of care)    Recommendations for Other Services Rehab consult    Precautions / Restrictions Precautions Precautions: Fall Precaution Comments: R hemiplegia Restrictions Weight Bearing Restrictions: No       Mobility Bed Mobility Overal bed mobility: Needs Assistance Bed Mobility: Supine to Sit     Supine to sit: Mod assist     General bed mobility comments: Pt in bed upon arrival. mod A to advance RLE toward EOB and scoot toward EOB. Pt able to elevate trunk and push up to sitting position with use of LUE.    Transfers Overall transfer level: Needs assistance Equipment used: Quad  cane Transfers: Sit to/from [facility] Sit to Stand: Mod assist;+2 physical assistance;+2 safety/equipment Stand pivot transfers: Mod assist;+2 physical assistance;+2 safety/equipment       General transfer comment: Mod A +2 for sit to stand with R knee block and use of quad cane. Mod A +2 for stand pivot to recliner with R knee block and external assist to advance RLE. Cues and external assist required for weight shift.    Balance Overall balance assessment: Needs assistance Sitting-balance support: Feet supported;Single extremity supported Sitting balance-Leahy Scale: Fair Sitting balance - Comments: Min guard to supervision A to maintain static sitting balance at EOB with unilateral UE support on bed surface. Postural control: Posterior lean Standing balance support: Single extremity supported Standing balance-Leahy Scale: Poor Standing balance comment: Reliant on +2 assist to maintain static standing balance.                           ADL either performed or assessed with clinical judgement   ADL                                         General ADL Comments: Patient greatly limited by R-sided flaccidity RUE>RLE and decrased sitting/standing balance. Reports compensating with use of LUE for some ADLs PTA.     Vision       Perception     Praxis      Cognition Arousal/Alertness: Awake/alert Behavior During Therapy: WFL for tasks assessed/performed Overall Cognitive Status: Within Functional Limits for tasks  assessed                                 General Comments: Follows verbal commands with 100% accuracy. Continued expressive aphasia.        Exercises Exercises: Other exercises Other Exercises Other Exercises: Self-ROM with use of LUE x10 reps each.   Shoulder Instructions       General Comments VSS on RA.    Pertinent Vitals/ Pain          Home Living                                           Prior Functioning/Environment              Frequency  Min 2X/week        Progress Toward Goals  OT Goals(current goals can now be found in the care plan section)  Progress towards OT goals: Progressing toward goals  Acute Rehab OT Goals Patient Stated Goal: To go to CIR OT Goal Formulation: With patient Time For Goal Achievement: 05/08/21 Potential to Achieve Goals: Good ADL Goals Pt Will Perform Eating: Independently;sitting Pt Will Perform Grooming: with set-up;sitting Pt Will Perform Upper Body Dressing: with set-up Pt Will Perform Lower Body Dressing: with min assist;sit to/from stand;sitting/lateral leans Pt Will Transfer to Toilet: with min assist;bedside commode;ambulating Pt Will Perform Toileting - Clothing Manipulation and hygiene: with min assist;sit to/from stand Additional ADL Goal #1: Patient will complete sit to stand transfers with Min A and use AE PRN. Additional ADL Goal #2: Patient will complete stand-pivot transfers with Min A and AD PRN in prep for ADLs.  Plan Discharge plan remains appropriate;Frequency remains appropriate    Co-evaluation                 AM-PAC OT "6 Clicks" Daily Activity     Outcome Measure   Help from another person eating meals?: A Little Help from another person taking care of personal grooming?: A Lot Help from another person toileting, which includes using toliet, bedpan, or urinal?: A Lot Help from another person bathing (including washing, rinsing, drying)?: A Lot Help from another person to put on and taking off regular upper body clothing?: A Lot Help from another person to put on and taking off regular lower body clothing?: A Lot 6 Click Score: 13    End of Session Equipment Utilized During Treatment: Gait belt;Other (comment) (Quad cane)  OT Visit Diagnosis: Unsteadiness on feet (R26.81);Other abnormalities of gait and mobility (R26.89);Muscle weakness (generalized) (M62.81);Hemiplegia and  hemiparesis Hemiplegia - Right/Left: Right Hemiplegia - dominant/non-dominant: Dominant Hemiplegia - caused by: Other Nontraumatic intracranial hemorrhage   Activity Tolerance Patient tolerated treatment well   Patient Left in chair;with call bell/phone within reach;with chair alarm set   Nurse Communication Mobility status;Other (comment) (Response to treatment)        Time: 3570-1779 OT Time Calculation (min): 26 min  Charges: OT General Charges $OT Visit: 1 Visit OT Treatments $Therapeutic Activity: 8-22 mins $Neuromuscular Re-education: 8-22 mins  Rehanna Oloughlin H. OTR/L Supplemental OT, Department of rehab services [phone]   Isabellah Sobocinski R H. 04/26/2021, 12:39 PM

## 2021-04-26 NOTE — Evaluation (Signed)
Speech Language Pathology Evaluation Patient Details Name: Angela Carpenter MRN: [id number] DOB: 02-24-1941 Today's Date: 04/26/2021 Time: 0920-0940 SLP Time Calculation (min) (ACUTE ONLY): 20 min  Problem List:  Patient Active Problem List   Diagnosis Date Noted   Hemiparesis ([name])    Metastasis to brain ([place]) 12/12/2020   Seizure disorder, focal motor ([place]) 12/12/2020   ICH (intracerebral hemorrhage) ([place]) 12/11/2020   Encounter for antineoplastic immunotherapy 06/30/2020   Malignant melanoma of anal canal ([place]) 07/21/2019   Hypertensive heart disease 12/04/2016   HX: anticoagulation 12/04/2016   PAF (paroxysmal atrial fibrillation) ([place]) 08/29/2015   Hyperlipidemia 08/29/2015   Atrial fibrillation ([place]) 08/28/2015   Past Medical History:  Past Medical History:  Diagnosis Date   A-fib [facility])    Atrial fibrillation ([place])    4 years ago started   Brain cancer ([place]) 12/2020   Breast cancer ([place]) 2006   Colon polyps    Glaucoma    Hypercholesteremia    Hypertension    patient denies    Osteoporosis    Personal history of chemotherapy 2006   Personal history of radiation therapy 2006   Stroke (cerebrum) ([place]) 2015   Past Surgical History:  Past Surgical History:  Procedure Laterality Date   AUGMENTATION MAMMAPLASTY Bilateral 2006   Patient had them removed in 2008   BREAST BIOPSY Left 2016   BREAST LUMPECTOMY Right 2006   COLONOSCOPY     had polyps   EYE SURGERY Right    cataract   MELANOMA EXCISION WITH SENTINEL LYMPH NODE BIOPSY N/A 06/02/2019   Procedure: Left lateral ANAL MELANOMA EXCISION WITH Bilateral Groin exploration and SENTINEL node mapping;  Surgeon: [name], MD;  Location: [facility];  Service: General;  Laterality: N/A;   TONSILLECTOMY     HPI:  80 yo patient with hx metastatic melanoma to the brain w/ known hemorrhagic met L frontal lobe s/p SRS, breast cancer, a fib not on anticoagulation or ASA 2/2 hemorrhagic met admitted with worsening R sided  weakness in setting of marked expansion of LF hemorrhagic met.   Assessment / Plan / Recommendation Clinical Impression  Pt demonstrates mild expressive impairment, only observable in conversation level speech. WAB-Bedside administered with a score of 95 on the Bedside aphasia score. Impairment seems to be a combination of mild word finding with complex language and occasional phonemic paraphasias with predominantly initial sound miscues impacting adequate fluency at times. Pt recognizes and corrects all errors and benefits from a slower rate. Increased pressure to speak and internal pressure for accuracy seems to worsen function. comprehension, cognition and pragmatics are all WNL. Pt does not need any interventions acutely until after surgery when reassessment is recommended.    SLP Assessment       Follow Up Recommendations  Inpatient Rehab    Frequency and Duration min 1 x/week  1 week      SLP Evaluation Cognition  Overall Cognitive Status: Within Functional Limits for tasks assessed       Comprehension  Auditory Comprehension Overall Auditory Comprehension: Appears within functional limits for tasks assessed Reading Comprehension Reading Status: Within funtional limits    Expression Verbal Expression Overall Verbal Expression: Impaired Initiation: No impairment Automatic Speech: Name;Social Response Level of Generative/Spontaneous Verbalization: Conversation Repetition: No impairment Naming: No impairment Pragmatics: No impairment   Oral / Motor  Oral Motor/Sensory Function Overall Oral Motor/Sensory Function: Within functional limits Motor Speech Overall Motor Speech: Impaired Respiration: Within functional limits Phonation: Normal Resonance: Within functional limits Articulation: Within  functional limitis Intelligibility: Intelligible Motor Planning: Impaired 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

## 2021-04-26 NOTE — Progress Notes (Addendum)
Dr. Kathyrn Sheriff has discussed surgical options with Angela Carpenter. After reviewing her options with her son, she has decided to go forward with surgical resection. Per the [facility] surgical scheduler, Nicki, Angela Carpenter will remain an IP until after her surgery takes place. This has been communicated to the patient and routed to her treatment team/providers.    Surgery has been moved up to 05/01/21   Mont Dutton R.T.(R)(T) Radiation Special Procedures Navigator

## 2021-04-26 NOTE — Telephone Encounter (Signed)
Scheduled appointment per 07/21 sch msg. Patient is aware. 

## 2021-04-26 NOTE — Plan of Care (Signed)
  Problem: Education: Goal: Knowledge of disease or condition will improve Outcome: Progressing Goal: Knowledge of secondary prevention will improve Outcome: Progressing Goal: Knowledge of patient specific risk factors addressed and post discharge goals established will improve Outcome: Progressing Goal: Individualized Educational Video(s) Outcome: Progressing   Problem: Nutrition: Goal: Dietary intake will improve Outcome: Progressing   Problem: Education: Goal: Knowledge of disease or condition will improve Outcome: Progressing Goal: Knowledge of secondary prevention will improve Outcome: Progressing Goal: Knowledge of patient specific risk factors addressed and post discharge goals established will improve Outcome: Progressing Goal: Individualized Educational Video(s) Outcome: Progressing   Problem: Nutrition: Goal: Dietary intake will improve Outcome: Progressing   Problem: Education: Goal: Knowledge of General Education information will improve Description: Including pain rating scale, medication(s)/side effects and non-pharmacologic comfort measures Outcome: Progressing   Problem: Health Behavior/Discharge Planning: Goal: Ability to manage health-related needs will improve Outcome: Progressing   Problem: Clinical Measurements: Goal: Ability to maintain clinical measurements within normal limits will improve Outcome: Progressing Goal: Will remain free from infection Outcome: Progressing Goal: Diagnostic test results will improve Outcome: Progressing Goal: Cardiovascular complication will be avoided Outcome: Progressing   Problem: Activity: Goal: Risk for activity intolerance will decrease Outcome: Progressing   Problem: Nutrition: Goal: Adequate nutrition will be maintained Outcome: Progressing   Problem: Coping: Goal: Level of anxiety will decrease Outcome: Progressing   Problem: Elimination: Goal: Will not experience complications related to bowel  motility Outcome: Progressing Goal: Will not experience complications related to urinary retention Outcome: Progressing   Problem: Pain Managment: Goal: General experience of comfort will improve Outcome: Progressing   Problem: Safety: Goal: Ability to remain free from injury will improve Outcome: Progressing   Problem: Skin Integrity: Goal: Risk for impaired skin integrity will decrease Outcome: Progressing   Problem: Clinical Measurements: Goal: Respiratory complications will improve Outcome: Progressing

## 2021-04-26 NOTE — Progress Notes (Signed)
PROGRESS NOTE    Angela Carpenter  NOI:[id number] DOB: [birth year] DOA: 04/23/2021 PCP: Haywood Pao, MD   Brief Narrative:  80 yo patient with hx metastatic melanoma to the brain w/ known hemorrhagic met L frontal lobe s/p SRS, breast cancer, a fib not on anticoagulation or ASA 2/2 hemorrhagic met admitted with worsening R sided weakness in setting of marked expansion of LF hemorrhagic met.  Patient was transferred under [place] on 04/25/2021.  Assessment & Plan:   Active Problems:   ICH (intracerebral hemorrhage) (HCC)   Metastasis to brain ([place])   Hemiparesis ([place])  Intracranial hemorrhagic metastasis, primary mucosal melanoma/fall: Presented with right-sided weakness.  Still has flaccid paralysis in right upper and lower extremity.  Initially admitted under neurology who had consulted neurooncology as well as neurosurgery and transferred under hospitalist service. Continue dexamethasone and Keppra.  Neurosurgery saw this patient.  They offered surgery.  Patient agreeable to proceed.  Per neurosurgery, she is scheduled to have surgery on 05/03/2021 and they recommend patient to remain inpatient until then.   Hyperlipidemia: Continue atorvastatin.  Hypertension: Controlled.  Continue Toprol-XL.  DVT prophylaxis: SCD's Start: 04/23/21 1323   Code Status: DNR  Family Communication:  None present at bedside.  Plan of care discussed with patient in length and he verbalized understanding and agreed with it.  Status is: Inpatient  Remains inpatient appropriate because: Pending surgical procedure scheduled on 05/03/2021.  Dispo: The patient is from: Home              Anticipated d/c is to: Home              Patient currently is not medically stable to d/c.   Difficult to place patient No        Estimated body mass index is 24.69 kg/m as calculated from the following:   Height as of this encounter: $RemoveBeforeD'5\' 5"'loPJHqBePbAbCb$  (1.651 m).   Weight as of this encounter: 67.3 kg.      Nutritional  status:               Consultants:  Neurology Neuro-oncology Oncology Neurosurgery  Procedures:  None  Antimicrobials:  Anti-infectives (From admission, onward)    Start     Dose/Rate Route Frequency Ordered Stop   04/24/21 1330  acyclovir (ZOVIRAX) tablet 400 mg        400 mg Oral 2 times daily 04/24/21 1233            Subjective: Seen and examined.  No complaints.  Wants to proceed with the surgery.  Objective: Vitals:   04/26/21 0740 04/26/21 0800 04/26/21 1028 04/26/21 1119  BP:  118/64 122/73   Pulse:  69 76   Resp:  16    Temp: 98.4 F (36.9 C)   97.6 F (36.4 C)  TempSrc: Oral   Oral  SpO2:  97%    Weight:      Height:        Intake/Output Summary (Last 24 hours) at 04/26/2021 1308 Last data filed at 04/26/2021 6945 Gross per 24 hour  Intake --  Output 825 ml  Net -825 ml    Filed Weights   04/23/21 1038 04/24/21 0604  Weight: 68 kg 67.3 kg    Examination:  General exam: Appears calm and comfortable  Respiratory system: Clear to auscultation. Respiratory effort normal. Cardiovascular system: S1 & S2 heard, RRR. No JVD, murmurs, rubs, gallops or clicks. No pedal edema. Gastrointestinal system: Abdomen is nondistended, soft and nontender. No organomegaly  or masses felt. Normal bowel sounds heard. Central nervous system: Alert and oriented.  Flaccid paralysis right upper and lower extremity. Extremities: Symmetric 5 x 5 power. Skin: No rashes, lesions or ulcers.  Psychiatry: Judgement and insight appear normal. Mood & affect appropriate.    Data Reviewed: I have personally reviewed following labs and imaging studies  CBC: Recent Labs  Lab 04/23/21 1315 04/26/21 0752  WBC 22.1* 19.4*  NEUTROABS 17.3* 16.9*  HGB 15.4* 14.8  HCT 47.3* 44.7  MCV 97.3 95.3  PLT 282 419    Basic Metabolic Panel: Recent Labs  Lab 04/23/21 1315 04/24/21 0824 04/26/21 0752  NA 135 136 136  K 3.4* 3.9 3.9  CL 98 101 101  CO2 $Re'28 24 25   'GYv$ GLUCOSE 105* 172* 115*  BUN 25* 22 25*  CREATININE 0.62 0.59 0.63  CALCIUM 9.0 9.1 9.1  MG 2.4  --  2.3  PHOS 3.3  --   --     GFR: Estimated Creatinine Clearance: 53 mL/min (by C-G formula based on SCr of 0.63 mg/dL). Liver Function Tests: Recent Labs  Lab 04/23/21 1315  AST 24  ALT 42  ALKPHOS 61  BILITOT 1.0  PROT 6.5  ALBUMIN 3.8    No results for input(s): LIPASE, AMYLASE in the last 168 hours. No results for input(s): AMMONIA in the last 168 hours. Coagulation Profile: Recent Labs  Lab 04/23/21 1315  INR 1.0    Cardiac Enzymes: No results for input(s): CKTOTAL, CKMB, CKMBINDEX, TROPONINI in the last 168 hours. BNP (last 3 results) No results for input(s): PROBNP in the last 8760 hours. HbA1C: No results for input(s): HGBA1C in the last 72 hours. CBG: Recent Labs  Lab 04/24/21 0603  GLUCAP 148*    Lipid Profile: No results for input(s): CHOL, HDL, LDLCALC, TRIG, CHOLHDL, LDLDIRECT in the last 72 hours. Thyroid Function Tests: No results for input(s): TSH, T4TOTAL, FREET4, T3FREE, THYROIDAB in the last 72 hours. Anemia Panel: No results for input(s): VITAMINB12, FOLATE, FERRITIN, TIBC, IRON, RETICCTPCT in the last 72 hours. Sepsis Labs: No results for input(s): PROCALCITON, LATICACIDVEN in the last 168 hours.  Recent Results (from the past 240 hour(s))  Resp Panel by RT-PCR (Flu A&B, Covid) Nasopharyngeal Swab     Status: None   Collection Time: 04/23/21  1:28 PM   Specimen: Nasopharyngeal Swab; Nasopharyngeal(NP) swabs in vial transport medium  Result Value Ref Range Status   SARS Coronavirus 2 by RT PCR NEGATIVE NEGATIVE Final    Comment: (NOTE) SARS-CoV-2 target nucleic acids are NOT DETECTED.  The SARS-CoV-2 RNA is generally detectable in upper respiratory specimens during the acute phase of infection. The lowest concentration of SARS-CoV-2 viral copies this assay can detect is 138 copies/mL. A negative result does not preclude  SARS-Cov-2 infection and should not be used as the sole basis for treatment or other patient management decisions. A negative result may occur with  improper specimen collection/handling, submission of specimen other than nasopharyngeal swab, presence of viral mutation(s) within the areas targeted by this assay, and inadequate number of viral copies(<138 copies/mL). A negative result must be combined with clinical observations, patient history, and epidemiological information. The expected result is Negative.  Fact Sheet for Patients:  EntrepreneurPulse.com.au  Fact Sheet for Healthcare Providers:  IncredibleEmployment.be  This test is no t yet approved or cleared by the [place] FDA and  has been authorized for detection and/or diagnosis of SARS-CoV-2 by FDA under an Emergency Use Authorization (EUA). This EUA will remain  in effect (meaning this test can be used) for the duration of the COVID-19 declaration under Section 564(b)(1) of the Act, 21 U.S.C.section 360bbb-3(b)(1), unless the authorization is terminated  or revoked sooner.       Influenza A by PCR NEGATIVE NEGATIVE Final   Influenza B by PCR NEGATIVE NEGATIVE Final    Comment: (NOTE) The Xpert Xpress SARS-CoV-2/FLU/RSV plus assay is intended as an aid in the diagnosis of influenza from Nasopharyngeal swab specimens and should not be used as a sole basis for treatment. Nasal washings and aspirates are unacceptable for Xpert Xpress SARS-CoV-2/FLU/RSV testing.  Fact Sheet for Patients: EntrepreneurPulse.com.au  Fact Sheet for Healthcare Providers: IncredibleEmployment.be  This test is not yet approved or cleared by the [place] FDA and has been authorized for detection and/or diagnosis of SARS-CoV-2 by FDA under an Emergency Use Authorization (EUA). This EUA will remain in effect (meaning this test can be used) for the duration of  the COVID-19 declaration under Section 564(b)(1) of the Act, 21 U.S.C. section 360bbb-3(b)(1), unless the authorization is terminated or revoked.  Performed at [facility], [place] [address], [place], [place] [address]   MRSA Next Gen by PCR, Nasal     Status: None   Collection Time: 04/24/21  6:06 AM   Specimen: Nasal Mucosa; Nasal Swab  Result Value Ref Range Status   MRSA by PCR Next Gen NOT DETECTED NOT DETECTED Final    Comment: (NOTE) The GeneXpert MRSA Assay (FDA approved for NASAL specimens only), is one component of a comprehensive MRSA colonization surveillance program. It is not intended to diagnose MRSA infection nor to guide or monitor treatment for MRSA infections. Test performance is not FDA approved in patients less than 63 years old. Performed at [facility], [place] [address], [place], [place] [address]   Urine Culture     Status: None   Collection Time: 04/24/21  8:53 AM   Specimen: Urine, Clean Catch  Result Value Ref Range Status   Specimen Description URINE, CLEAN CATCH  Final   Special Requests NONE  Final   Culture   Final    NO GROWTH Performed at [facility], [place] [address] [place], [place] [address]    Report Status 04/25/2021 FINAL  Final       Radiology Studies: No results found.  Scheduled Meds:   stroke: mapping our early stages of recovery book   Does not apply Once   acyclovir  400 mg Oral BID   atorvastatin  10 mg Oral Daily   Chlorhexidine Gluconate Cloth  6 each Topical Daily   dexamethasone  8 mg Oral Q12H   levETIRAcetam  500 mg Oral BID   metoprolol succinate  25 mg Oral BID   pantoprazole  40 mg Oral QHS   raloxifene  60 mg Oral q AM   senna-docusate  1 tablet Oral BID   Continuous Infusions:  clevidipine       LOS: 3 days   Time spent: 30 minutes   Darliss Cheney, MD [facility]  04/26/2021, 1:08 PM   How to contact the [facility] Attending or Consulting provider [place] or covering provider during  after hours [place], for this patient?  Check the care team in [facility] and look for a) attending/consulting TRH provider listed and b) the [facility] team listed. Page or secure chat [phone]. Log into [email or web address] and use [place]'s universal password to access. If you do not have the password,  please contact the hospital operator. Locate the [facility] provider you are looking for under [facility] and page to a number that you can be directly reached. If you still have difficulty reaching the provider, please page the [facility] (Director on Call) for the Hospitalists listed on amion for assistance.

## 2021-04-27 DIAGNOSIS — I611 Nontraumatic intracerebral hemorrhage in hemisphere, cortical: Secondary | ICD-10-CM | POA: Diagnosis not present

## 2021-04-27 DIAGNOSIS — G8191 Hemiplegia, unspecified affecting right dominant side: Secondary | ICD-10-CM | POA: Diagnosis not present

## 2021-04-27 DIAGNOSIS — I10 Essential (primary) hypertension: Secondary | ICD-10-CM | POA: Diagnosis not present

## 2021-04-27 DIAGNOSIS — C7931 Secondary malignant neoplasm of brain: Secondary | ICD-10-CM | POA: Diagnosis not present

## 2021-04-27 NOTE — Progress Notes (Signed)
PROGRESS NOTE    Angela Carpenter  IHK:[id number] DOB: [birth year] DOA: 04/23/2021 PCP: Haywood Pao, MD   Brief Narrative:  80 yo patient with hx metastatic melanoma to the brain w/ known hemorrhagic met L frontal lobe s/p SRS, breast cancer, a fib not on anticoagulation or ASA 2/2 hemorrhagic met admitted with worsening R sided weakness in setting of marked expansion of LF hemorrhagic met.  Patient was transferred under [place] on 04/25/2021.  Patient waiting for her surgery scheduled on 05/01/2021.  Assessment & Plan:   Active Problems:   ICH (intracerebral hemorrhage) (HCC)   Metastasis to brain ([place])   Hemiparesis ([place])  Intracranial hemorrhagic metastasis, primary mucosal melanoma/fall: Presented with right-sided weakness.  Still has flaccid paralysis in right upper and lower extremity.  Initially admitted under neurology who had consulted neurooncology as well as neurosurgery and transferred under hospitalist service. Continue dexamethasone and Keppra.  Neurosurgery saw this patient.  They offered surgery.  Patient agreeable to proceed.  Per neurosurgery, she is scheduled to have surgery on 7/262022 and they recommend patient to remain inpatient until then.   Hyperlipidemia: Continue atorvastatin.  Hypertension: Controlled.  Continue Toprol-XL.  DVT prophylaxis: SCD's Start: 04/23/21 1323   Code Status: DNR  Family Communication:  None present at bedside.  Plan of care discussed with patient in length and he verbalized understanding and agreed with it.  Status is: Inpatient  Remains inpatient appropriate because: Pending surgical procedure scheduled on 05/03/2021.  Dispo: The patient is from: Home              Anticipated d/c is to: Home              Patient currently is not medically stable to d/c.   Difficult to place patient No        Estimated body mass index is 24.69 kg/m as calculated from the following:   Height as of this encounter: $RemoveBeforeD'5\' 5"'OVoVLFKGCWKvSX$  (1.651 m).   Weight as  of this encounter: 67.3 kg.      Nutritional status:               Consultants:  Neurology Neuro-oncology Oncology Neurosurgery  Procedures:  None  Antimicrobials:  Anti-infectives (From admission, onward)    Start     Dose/Rate Route Frequency Ordered Stop   04/24/21 1330  acyclovir (ZOVIRAX) tablet 400 mg        400 mg Oral 2 times daily 04/24/21 1233            Subjective: Seen and examined.  No new complaints and no change.  Objective: Vitals:   04/27/21 0500 04/27/21 0600 04/27/21 0700 04/27/21 0715  BP: 127/71 138/69 136/67   Pulse: 62 65 73   Resp: $Remo'17 19 18   'KbGOG$ Temp:    97.7 F (36.5 C)  TempSrc:    Oral  SpO2: 96% 95% 95%   Weight:      Height:        Intake/Output Summary (Last 24 hours) at 04/27/2021 1116 Last data filed at 04/27/2021 0600 Gross per 24 hour  Intake 480 ml  Output 1750 ml  Net -1270 ml    Filed Weights   04/23/21 1038 04/24/21 0604  Weight: 68 kg 67.3 kg    Examination:  General exam: Appears calm and comfortable  Respiratory system: Clear to auscultation. Respiratory effort normal. Cardiovascular system: S1 & S2 heard, RRR. No JVD, murmurs, rubs, gallops or clicks. No pedal edema. Gastrointestinal system: Abdomen is nondistended, soft  and nontender. No organomegaly or masses felt. Normal bowel sounds heard. Central nervous system: Alert and oriented.  Flaccid paralysis right upper and lower extremity. Extremities: Symmetric 5 x 5 power. Skin: No rashes, lesions or ulcers.  Psychiatry: Judgement and insight appear normal. Mood & affect appropriate.    Data Reviewed: I have personally reviewed following labs and imaging studies  CBC: Recent Labs  Lab 04/23/21 1315 04/26/21 0752  WBC 22.1* 19.4*  NEUTROABS 17.3* 16.9*  HGB 15.4* 14.8  HCT 47.3* 44.7  MCV 97.3 95.3  PLT 282 716    Basic Metabolic Panel: Recent Labs  Lab 04/23/21 1315 04/24/21 0824 04/26/21 0752  NA 135 136 136  K 3.4* 3.9 3.9  CL  98 101 101  CO2 $Re'28 24 25  'afv$ GLUCOSE 105* 172* 115*  BUN 25* 22 25*  CREATININE 0.62 0.59 0.63  CALCIUM 9.0 9.1 9.1  MG 2.4  --  2.3  PHOS 3.3  --   --     GFR: Estimated Creatinine Clearance: 53 mL/min (by C-G formula based on SCr of 0.63 mg/dL). Liver Function Tests: Recent Labs  Lab 04/23/21 1315  AST 24  ALT 42  ALKPHOS 61  BILITOT 1.0  PROT 6.5  ALBUMIN 3.8    No results for input(s): LIPASE, AMYLASE in the last 168 hours. No results for input(s): AMMONIA in the last 168 hours. Coagulation Profile: Recent Labs  Lab 04/23/21 1315  INR 1.0    Cardiac Enzymes: No results for input(s): CKTOTAL, CKMB, CKMBINDEX, TROPONINI in the last 168 hours. BNP (last 3 results) No results for input(s): PROBNP in the last 8760 hours. HbA1C: No results for input(s): HGBA1C in the last 72 hours. CBG: Recent Labs  Lab 04/24/21 0603  GLUCAP 148*    Lipid Profile: No results for input(s): CHOL, HDL, LDLCALC, TRIG, CHOLHDL, LDLDIRECT in the last 72 hours. Thyroid Function Tests: No results for input(s): TSH, T4TOTAL, FREET4, T3FREE, THYROIDAB in the last 72 hours. Anemia Panel: No results for input(s): VITAMINB12, FOLATE, FERRITIN, TIBC, IRON, RETICCTPCT in the last 72 hours. Sepsis Labs: No results for input(s): PROCALCITON, LATICACIDVEN in the last 168 hours.  Recent Results (from the past 240 hour(s))  Resp Panel by RT-PCR (Flu A&B, Covid) Nasopharyngeal Swab     Status: None   Collection Time: 04/23/21  1:28 PM   Specimen: Nasopharyngeal Swab; Nasopharyngeal(NP) swabs in vial transport medium  Result Value Ref Range Status   SARS Coronavirus 2 by RT PCR NEGATIVE NEGATIVE Final    Comment: (NOTE) SARS-CoV-2 target nucleic acids are NOT DETECTED.  The SARS-CoV-2 RNA is generally detectable in upper respiratory specimens during the acute phase of infection. The lowest concentration of SARS-CoV-2 viral copies this assay can detect is 138 copies/mL. A negative result does  not preclude SARS-Cov-2 infection and should not be used as the sole basis for treatment or other patient management decisions. A negative result may occur with  improper specimen collection/handling, submission of specimen other than nasopharyngeal swab, presence of viral mutation(s) within the areas targeted by this assay, and inadequate number of viral copies(<138 copies/mL). A negative result must be combined with clinical observations, patient history, and epidemiological information. The expected result is Negative.  Fact Sheet for Patients:  EntrepreneurPulse.com.au  Fact Sheet for Healthcare Providers:  IncredibleEmployment.be  This test is no t yet approved or cleared by the [place] FDA and  has been authorized for detection and/or diagnosis of SARS-CoV-2 by FDA under an Emergency Use Authorization (EUA).  This EUA will remain  in effect (meaning this test can be used) for the duration of the COVID-19 declaration under Section 564(b)(1) of the Act, 21 U.S.C.section 360bbb-3(b)(1), unless the authorization is terminated  or revoked sooner.       Influenza A by PCR NEGATIVE NEGATIVE Final   Influenza B by PCR NEGATIVE NEGATIVE Final    Comment: (NOTE) The Xpert Xpress SARS-CoV-2/FLU/RSV plus assay is intended as an aid in the diagnosis of influenza from Nasopharyngeal swab specimens and should not be used as a sole basis for treatment. Nasal washings and aspirates are unacceptable for Xpert Xpress SARS-CoV-2/FLU/RSV testing.  Fact Sheet for Patients: EntrepreneurPulse.com.au  Fact Sheet for Healthcare Providers: IncredibleEmployment.be  This test is not yet approved or cleared by the [place] FDA and has been authorized for detection and/or diagnosis of SARS-CoV-2 by FDA under an Emergency Use Authorization (EUA). This EUA will remain in effect (meaning this test can be used) for the  duration of the COVID-19 declaration under Section 564(b)(1) of the Act, 21 U.S.C. section 360bbb-3(b)(1), unless the authorization is terminated or revoked.  Performed at [facility], [place] [address] [place], [place] [address]   MRSA Next Gen by PCR, Nasal     Status: None   Collection Time: 04/24/21  6:06 AM   Specimen: Nasal Mucosa; Nasal Swab  Result Value Ref Range Status   MRSA by PCR Next Gen NOT DETECTED NOT DETECTED Final    Comment: (NOTE) The GeneXpert MRSA Assay (FDA approved for NASAL specimens only), is one component of a comprehensive MRSA colonization surveillance program. It is not intended to diagnose MRSA infection nor to guide or monitor treatment for MRSA infections. Test performance is not FDA approved in patients less than 72 years old. Performed at [facility], [place] [address], [place], [place] [address]   Urine Culture     Status: None   Collection Time: 04/24/21  8:53 AM   Specimen: Urine, Clean Catch  Result Value Ref Range Status   Specimen Description URINE, CLEAN CATCH  Final   Special Requests NONE  Final   Culture   Final    NO GROWTH Performed at [facility], [place] [address], [place], [place] [address]    Report Status 04/25/2021 FINAL  Final       Radiology Studies: No results found.  Scheduled Meds:   stroke: mapping our early stages of recovery book   Does not apply Once   acyclovir  400 mg Oral BID   atorvastatin  10 mg Oral Daily   Chlorhexidine Gluconate Cloth  6 each Topical Daily   dexamethasone  8 mg Oral Q12H   levETIRAcetam  500 mg Oral BID   metoprolol succinate  25 mg Oral BID   pantoprazole  40 mg Oral QHS   raloxifene  60 mg Oral q AM   senna-docusate  1 tablet Oral BID   Continuous Infusions:  clevidipine       LOS: 4 days   Time spent: 26 minutes   Darliss Cheney, MD [facility]  04/27/2021, 11:16 AM   How to contact the [facility] Attending or Consulting provider [place] or covering  provider during after hours [place], for this patient?  Check the care team in [facility] and look for a) attending/consulting TRH provider listed and b) the [facility] team listed. Page or secure chat [phone]. Log into [email or web address] and use [place]'s universal password to access. If you  do not have the password, please contact the hospital operator. Locate the TRH provider you are looking for under [facility] and page to a number that you can be directly reached. If you still have difficulty reaching the provider, please page the [facility] (Director on Call) for the Hospitalists listed on amion for assistance.  

## 2021-04-27 NOTE — Progress Notes (Signed)
Neurology Progress Note  Patient ID: 80 yo patient with hx metastatic melanoma to the brain w/ known hemorrhagic met L frontal lobe s/p SRS, breast cancer, a fib not on anticoagulation or ASA 2/2 hemorrhagic met admitted with worsening R sided weakness in setting of marked expansion of LF hemorrhagic met.  Subjective: - Slight improvement in aphasia, R sided weakness is unchanged - No new neurologic complaints today - Pt now scheduled for surgery 05/01/21 - stereotactic left frontal craniotomy for resection of the metastatic tumor   Exam: Vitals:   04/27/21 0700 04/27/21 0715  BP: 136/67   Pulse: 73   Resp: 18   Temp:  97.7 F (36.5 C)  SpO2: 95%    Physical Exam Gen: A&O x4, NAD HEENT: Atraumatic, normocephalic;mucous membranes moist; oropharynx clear, tongue without atrophy or fasciculations. Neck: Supple, trachea midline. Resp: CTAB, no w/r/r CV: RRR, no m/g/r; nml S1 and S2. 2+ symmetric peripheral pulses. Abd: soft/NT/ND; nabs x 4 quad Extrem: Nml bulk; no cyanosis, clubbing, or edema.   Neuro: *MS: A&O x4. Follows multi-step commands. *Speech: fluid, nondysarthric, mild WFD but able to name and repeat *CN:   I: Deferred   II,III: PERRLA, VFF by confrontation, optic discs not visualized 2/2 pupillary constriction   III,IV,VI: EOMI w/o nystagmus, no ptosis   V: Sensation intact from V1 to V3 to LT   VII: Eyelid closure was full.  R UMN facial droop   VIII: Hearing intact to voice   IX,X: Voice normal, palate elevates symmetrically   XI: SCM/trap 5/5 bilat   XII: Tongue protrudes midline, no atrophy or fasciculations *Motor:   Normal bulk.  No tremor, rigidity or bradykinesia. Flaccid paralysis of RUE and RLE. L side full strength throughout. *Sensory: Intact to light touch, pinprick, temperature vibration throughout. Symmetric. Propioception intact bilat.  No double-simultaneous extinction. *Coordination:  FNF intact on L. *Reflexes:  2+ more brisk on R, toes mute LUE  upgoing R *Gait: UTA 2/2 weakness     Premorbid mRS = 3   ICH score = 0  Impression: 80 yo patient with hx metastatic melanoma to the brain w/ known hemorrhagic met L frontal lobe s/p SRS, breast cancer, a fib not on anticoagulation or ASA 2/2 hemorrhagic met admitted with worsening R sided weakness in setting of marked expansion of LF hemorrhagic met.  She has always had hyperintensity on head CT 2/2 petechial hemorrhage there but given her rapid worsening over the weekend and appearance on personal review of the imaging I was concerned that her worsening was due at least in part to further hemorrhage into the met. Fortunately her scans were completely stable and she does not appear to be actively hemorrhaging at this time. She is scheduled for resection of the met on 7/26.  Recommendations:  # Significant expansion of L frontal hemorrhagic metastasis (melanoma) with marked vasogenic edema - BP under control without prns. Goal systolic <758, prn hydralazine if needed - STAT head CT for any change in mental status or neurologic exam - Decadron 96m q12 hrs, taper per NSU - No AC or ASA. SCDs for DVT prophylaxis - Continue home prophylactic keppra - PT/OT - Holding keytruda   # A fib - No AC or ASA. SCDs for DVT prophylaxis  Given that patient has no evidence of active hemorrhage, neurology will defer further mgmt to oncology and neurosurgery. Neurology will not continue to actively follow, but please re-engage if new neurologic concerns arise.  CSu Monks MD [facility] [phone]  If 7pm- 7am, please page neurology on call as listed in [place].

## 2021-04-27 NOTE — Progress Notes (Signed)
Physical Therapy Treatment Patient Details Name: Angela Carpenter MRN: [id number] DOB: May 06, 1941 Today's Date: 04/27/2021    History of Present Illness pt is a 80 y/o female presenting to ED with worsening R sided weakness in setting of marked expansion of L frotnal hemorrhagic met with surrounding vasogenic edema. Patient now completely flaccid in RUE/RLE with dysarthria. Neurosurgery following for surgical intervention. 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    PT Comments    Pt making slow steady progress with mobility. Remains highly motivated to work toward more independence. Pt for surgery 7/26. Continue to recommend CIR for further therapy.    Follow Up Recommendations  CIR     Equipment Recommendations  Other (comment) (TBA)    Recommendations for Other Services Rehab consult     Precautions / Restrictions Precautions Precautions: Fall Precaution Comments: R hemiplegia Restrictions Weight Bearing Restrictions: Yes RLE Weight Bearing: Weight bearing as tolerated    Mobility  Bed Mobility Overal bed mobility: Needs Assistance Bed Mobility: Supine to Sit     Supine to sit: Mod assist;HOB elevated     General bed mobility comments: Assist to bring RLE off of bed, elevate trunk into sitting and bring hips to EOB    Transfers Overall transfer level: Needs assistance Equipment used: 1 person hand held assist (back of straight chair) Transfers: Sit to/from [employer] Transfers Sit to Stand: Mod assist Stand pivot transfers: Mod assist;+2 physical assistance       General transfer comment: Assist to bring hips up and for balance. Had pt hold back of small straight chair to stand from bed x 3 and from chair x 1. Performed stand pivot to chair with +2 mod assist with pt holding my  forearm. With pivot rt knee hyperextends and foot wants to slide forward.  Ambulation/Gait             General Gait Details: Did not attempt   Stairs             Wheelchair Mobility    Modified Rankin (Stroke Patients Only) Modified Rankin (Stroke Patients Only) Pre-Morbid Rankin Score: Slight disability Modified Rankin: Severe disability     Balance Overall balance assessment: Needs assistance Sitting-balance support: Feet supported;No upper extremity supported Sitting balance-Leahy Scale: Fair     Standing balance support: Single extremity supported Standing balance-Leahy Scale: Poor Standing balance comment: Maintained standing with LUE on back of chair and min assist.                            Cognition Arousal/Alertness: Awake/alert Behavior During Therapy: WFL for tasks assessed/performed Overall Cognitive Status: Within Functional Limits for tasks assessed                                        Exercises Other Exercises Other Exercises: Performed wt shift in standing    General Comments        Pertinent Vitals/Pain Pain Assessment: No/denies pain    Home Living                      Prior Function            PT Goals (current goals  can now be found in the care plan section) Acute Rehab PT Goals Patient Stated Goal: To go to CIR Progress towards PT goals: Progressing toward goals    Frequency    Min 4X/week      PT Plan Current plan remains appropriate    Co-evaluation              AM-PAC PT "6 Clicks" Mobility   Outcome Measure  Help needed turning from your back to your side while in a flat bed without using bedrails?: A Little Help needed moving from lying on your back to sitting on the side of a flat bed without using bedrails?: A Little Help needed moving to and from a bed to a chair (including a wheelchair)?: Total Help needed standing up from a chair using your arms (e.g.,  wheelchair or bedside chair)?: A Lot Help needed to walk in hospital room?: Total Help needed climbing 3-5 steps with a railing? : Total 6 Click Score: 11    End of Session Equipment Utilized During Treatment: Gait belt Activity Tolerance: Patient tolerated treatment well Patient left: in chair;with call bell/phone within reach;with chair alarm set Nurse Communication: Mobility status PT Visit Diagnosis: Unsteadiness on feet (R26.81);Other abnormalities of gait and mobility (R26.89)     Time: 0939-1000 PT Time Calculation (min) (ACUTE ONLY): 21 min  Charges:  $Therapeutic Activity: 8-22 mins                     [place] Pager [phone] Office [place] 04/27/2021, 10:36 AM

## 2021-04-27 NOTE — Progress Notes (Signed)
Inpatient Rehab Admissions Coordinator:   CIR continues to follow at a distance, will finalize plans for rehab once therapy has seen Pt. Following craniotomy next week.  Clemens Catholic, [place], [place] Admissions Coordinator  [phone] ([place]) [phone] (office)

## 2021-04-27 NOTE — Progress Notes (Signed)
Physical Therapy Treatment Patient Details Name: Angela Carpenter MRN: [id number] DOB: [birth year] Today's Date: 04/27/2021    History of Present Illness pt is a 80 y/o female presenting to ED with worsening R sided weakness in setting of marked expansion of L frotnal hemorrhagic met with surrounding vasogenic edema. Patient now completely flaccid in RUE/RLE with dysarthria. Neurosurgery following for surgical intervention. 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    PT Comments    Returned to have pt try using St[employer] for transfers. Pt did well with Stedy and using this pt able to transfer with 1 person assist.    Follow Up Recommendations  CIR     Equipment Recommendations  Other (comment) (TBA)    Recommendations for Other Services Rehab consult     Precautions / Restrictions Precautions Precautions: Fall Precaution Comments: R hemiplegia    Mobility  Bed Mobility Overal bed mobility: Needs Assistance Bed Mobility: Supine to Sit     Supine to sit: Mod assist;HOB elevated     General bed mobility comments: Pt up on bsc    Transfers Overall transfer level: Needs assistance Equipment used: Ambulation equipment used Transfers: Sit to/from [employer] Sit to Stand: Mod assist Stand pivot transfers: Mod assist;+2 physical assistance       General transfer comment: Assist to bring hips up and stand with Stedy.  Used [employer] for bsc to recliner  Ambulation/Gait             General Gait Details: Did not attempt   Stairs             Wheelchair Mobility    Modified Rankin (Stroke Patients Only) Modified Rankin (Stroke Patients Only) Pre-Morbid Rankin Score: Slight disability Modified Rankin: Severe disability     Balance Overall balance assessment: Needs  assistance Sitting-balance support: Feet supported;No upper extremity supported Sitting balance-Leahy Scale: Fair     Standing balance support: Single extremity supported Standing balance-Leahy Scale: Poor Standing balance comment: Stood with Stedy and min assist for ~1 minute for pericare.                            Cognition Arousal/Alertness: Awake/alert Behavior During Therapy: WFL for tasks assessed/performed Overall Cognitive Status: Within Functional Limits for tasks assessed                                        Exercises Other Exercises Other Exercises: Performed wt shift in standing    General Comments        Pertinent Vitals/Pain Pain Assessment: No/denies pain    Home Living                      Prior Function            PT Goals (current goals can now be found in the care plan section) Acute Rehab PT Goals Patient Stated Goal: To go to CIR Progress towards PT goals: Progressing toward goals    Frequency    Min 4X/week      PT Plan Current plan remains appropriate    Co-evaluation              AM-PAC PT "6  Clicks" Mobility   Outcome Measure  Help needed turning from your back to your side while in a flat bed without using bedrails?: A Little Help needed moving from lying on your back to sitting on the side of a flat bed without using bedrails?: A Little Help needed moving to and from a bed to a chair (including a wheelchair)?: Total Help needed standing up from a chair using your arms (e.g., wheelchair or bedside chair)?: A Lot Help needed to walk in hospital room?: Total Help needed climbing 3-5 steps with a railing? : Total 6 Click Score: 11    End of Session Equipment Utilized During Treatment: Gait belt Activity Tolerance: Patient tolerated treatment well Patient left: in chair;with call bell/phone within reach;with chair alarm set Nurse Communication: Mobility status;Need for lift  equipment PT Visit Diagnosis: Unsteadiness on feet (R26.81);Other abnormalities of gait and mobility (R26.89)     Time: 7741-2878 PT Time Calculation (min) (ACUTE ONLY): 12 min  Charges:  $Therapeutic Activity: 8-22 mins                     [place] Pager [phone] Office [place] 04/27/2021, 1:40 PM

## 2021-04-28 DIAGNOSIS — I10 Essential (primary) hypertension: Secondary | ICD-10-CM | POA: Diagnosis not present

## 2021-04-28 MED ORDER — RALOXIFENE HCL 60 MG PO TABS
60.0000 mg | ORAL_TABLET | Freq: Every day | ORAL | Status: DC
  Administered 2021-04-28 – 2021-05-03 (×6): 60 mg via ORAL
  Filled 2021-04-28 (×6): qty 1

## 2021-04-28 NOTE — Progress Notes (Signed)
PROGRESS NOTE    Angela Carpenter  CXK:[id number] DOB: 17-Oct-1940 DOA: 04/23/2021 PCP: Haywood Pao, MD   Brief Narrative:  80 yo patient with hx metastatic melanoma to the brain w/ known hemorrhagic met L frontal lobe s/p SRS, breast cancer, a fib not on anticoagulation or ASA 2/2 hemorrhagic met admitted with worsening R sided weakness in setting of marked expansion of LF hemorrhagic met.  Patient was transferred under [place] on 04/25/2021.  Patient waiting for her surgery scheduled on 05/01/2021.  Assessment & Plan:   Active Problems:   ICH (intracerebral hemorrhage) (HCC)   Metastasis to brain ([place])   Hemiparesis ([place])  Intracranial hemorrhagic metastasis, primary mucosal melanoma/fall: Presented with right-sided weakness.  Still has flaccid paralysis in right upper and lower extremity.  Initially admitted under neurology who had consulted neurooncology as well as neurosurgery and transferred under hospitalist service. Continue dexamethasone and Keppra.  Neurosurgery saw this patient.  They offered surgery.  Patient agreeable to proceed.  Per neurosurgery, she is scheduled to have surgery on 7/262022 and they recommend patient to remain inpatient until then.  No change in patient's right sided flaccid paralysis.  Hyperlipidemia: Continue atorvastatin.  Hypertension: Controlled.  Continue Toprol-XL.  DVT prophylaxis: SCD's Start: 04/23/21 1323   Code Status: DNR  Family Communication:  None present at bedside.  Plan of care discussed with patient in length and he verbalized understanding and agreed with it.  Status is: Inpatient  Remains inpatient appropriate because: Pending surgical procedure scheduled on 05/03/2021.  Dispo: The patient is from: Home              Anticipated d/c is to: Home              Patient currently is not medically stable to d/c.   Difficult to place patient No        Estimated body mass index is 24.69 kg/m as calculated from the following:    Height as of this encounter: _0  (1.651 m).   Weight as of this encounter: 67.3 kg.      Nutritional status:               Consultants:  Neurology Neuro-oncology Oncology Neurosurgery  Procedures:  None  Antimicrobials:  Anti-infectives (From admission, onward)    Start     Dose/Rate Route Frequency Ordered Stop   04/24/21 1330  acyclovir (ZOVIRAX) tablet 400 mg        400 mg Oral 2 times daily 04/24/21 1233            Subjective: Seen and examined.  No complaints.  Status quo.  Objective: Vitals:   04/27/21 2144 04/28/21 0034 04/28/21 0412 04/28/21 0735  BP: 137/63 123/65 123/72 114/72  Pulse: 75 63 66 76  Resp: _1 Temp: 98.2 F (36.8 C) 98 F (36.7 C) 97.8 F (36.6 C) 98 F (36.7 C)  TempSrc: Oral Oral  Axillary  SpO2: 100% 98% 97% 100%  Weight:      Height:        Intake/Output Summary (Last 24 hours) at 04/28/2021 1107 Last data filed at 04/28/2021 0600 Gross per 24 hour  Intake 240 ml  Output 2325 ml  Net -2085 ml    Filed Weights   04/23/21 1038 04/24/21 0604  Weight: 68 kg 67.3 kg    Examination:  General exam: Appears calm and comfortable  Respiratory system: Clear to auscultation. Respiratory effort normal. Cardiovascular system: S1 & S2 heard, RRR.  No JVD, murmurs, rubs, gallops or clicks. No pedal edema. Gastrointestinal system: Abdomen is nondistended, soft and nontender. No organomegaly or masses felt. Normal bowel sounds heard. Central nervous system: Alert and oriented.  Right-sided flaccid paralysis Extremities: Symmetric 5 x 5 power. Skin: No rashes, lesions or ulcers.  Psychiatry: Judgement and insight appear normal. Mood & affect appropriate.    Data Reviewed: I have personally reviewed following labs and imaging studies  CBC: Recent Labs  Lab 04/23/21 1315 04/26/21 0752  WBC 22.1* 19.4*  NEUTROABS 17.3* 16.9*  HGB 15.4* 14.8  HCT 47.3* 44.7  MCV 97.3 95.3  PLT 282 235    Basic Metabolic  Panel: Recent Labs  Lab 04/23/21 1315 04/24/21 0824 04/26/21 0752  NA 135 136 136  K 3.4* 3.9 3.9  CL 98 101 101  CO2 _0 GLUCOSE 105* 172* 115*  BUN 25* 22 25*  CREATININE 0.62 0.59 0.63  CALCIUM 9.0 9.1 9.1  MG 2.4  --  2.3  PHOS 3.3  --   --     GFR: Estimated Creatinine Clearance: 53 mL/min (by C-G formula based on SCr of 0.63 mg/dL). Liver Function Tests: Recent Labs  Lab 04/23/21 1315  AST 24  ALT 42  ALKPHOS 61  BILITOT 1.0  PROT 6.5  ALBUMIN 3.8    No results for input(s): LIPASE, AMYLASE in the last 168 hours. No results for input(s): AMMONIA in the last 168 hours. Coagulation Profile: Recent Labs  Lab 04/23/21 1315  INR 1.0    Cardiac Enzymes: No results for input(s): CKTOTAL, CKMB, CKMBINDEX, TROPONINI in the last 168 hours. BNP (last 3 results) No results for input(s): PROBNP in the last 8760 hours. HbA1C: No results for input(s): HGBA1C in the last 72 hours. CBG: Recent Labs  Lab 04/24/21 0603  GLUCAP 148*    Lipid Profile: No results for input(s): CHOL, HDL, LDLCALC, TRIG, CHOLHDL, LDLDIRECT in the last 72 hours. Thyroid Function Tests: No results for input(s): TSH, T4TOTAL, FREET4, T3FREE, THYROIDAB in the last 72 hours. Anemia Panel: No results for input(s): VITAMINB12, FOLATE, FERRITIN, TIBC, IRON, RETICCTPCT in the last 72 hours. Sepsis Labs: No results for input(s): PROCALCITON, LATICACIDVEN in the last 168 hours.  Recent Results (from the past 240 hour(s))  Resp Panel by RT-PCR (Flu A&B, Covid) Nasopharyngeal Swab     Status: None   Collection Time: 04/23/21  1:28 PM   Specimen: Nasopharyngeal Swab; Nasopharyngeal(NP) swabs in vial transport medium  Result Value Ref Range Status   SARS Coronavirus 2 by RT PCR NEGATIVE NEGATIVE Final    Comment: (NOTE) SARS-CoV-2 target nucleic acids are NOT DETECTED.  The SARS-CoV-2 RNA is generally detectable in upper respiratory specimens during the acute phase of infection. The  lowest concentration of SARS-CoV-2 viral copies this assay can detect is 138 copies/mL. A negative result does not preclude SARS-Cov-2 infection and should not be used as the sole basis for treatment or other patient management decisions. A negative result may occur with  improper specimen collection/handling, submission of specimen other than nasopharyngeal swab, presence of viral mutation(s) within the areas targeted by this assay, and inadequate number of viral copies(<138 copies/mL). A negative result must be combined with clinical observations, patient history, and epidemiological information. The expected result is Negative.  Fact Sheet for Patients:  EntrepreneurPulse.com.au  Fact Sheet for Healthcare Providers:  IncredibleEmployment.be  This test is no t yet approved or cleared by the [place] FDA and  has been authorized for detection  and/or diagnosis of SARS-CoV-2 by FDA under an Emergency Use Authorization (EUA). This EUA will remain  in effect (meaning this test can be used) for the duration of the COVID-19 declaration under Section 564(b)(1) of the Act, 21 U.S.C.section 360bbb-3(b)(1), unless the authorization is terminated  or revoked sooner.       Influenza A by PCR NEGATIVE NEGATIVE Final   Influenza B by PCR NEGATIVE NEGATIVE Final    Comment: (NOTE) The Xpert Xpress SARS-CoV-2/FLU/RSV plus assay is intended as an aid in the diagnosis of influenza from Nasopharyngeal swab specimens and should not be used as a sole basis for treatment. Nasal washings and aspirates are unacceptable for Xpert Xpress SARS-CoV-2/FLU/RSV testing.  Fact Sheet for Patients: EntrepreneurPulse.com.au  Fact Sheet for Healthcare Providers: IncredibleEmployment.be  This test is not yet approved or cleared by the [place] FDA and has been authorized for detection and/or diagnosis of SARS-CoV-2 by FDA under  an Emergency Use Authorization (EUA). This EUA will remain in effect (meaning this test can be used) for the duration of the COVID-19 declaration under Section 564(b)(1) of the Act, 21 U.S.C. section 360bbb-3(b)(1), unless the authorization is terminated or revoked.  Performed at [facility], [place] [address], [place], [place] [address]   MRSA Next Gen by PCR, Nasal     Status: None   Collection Time: 04/24/21  6:06 AM   Specimen: Nasal Mucosa; Nasal Swab  Result Value Ref Range Status   MRSA by PCR Next Gen NOT DETECTED NOT DETECTED Final    Comment: (NOTE) The GeneXpert MRSA Assay (FDA approved for NASAL specimens only), is one component of a comprehensive MRSA colonization surveillance program. It is not intended to diagnose MRSA infection nor to guide or monitor treatment for MRSA infections. Test performance is not FDA approved in patients less than 33 years old. Performed at [facility], [place] [address] [place], [place] [address]   Urine Culture     Status: None   Collection Time: 04/24/21  8:53 AM   Specimen: Urine, Clean Catch  Result Value Ref Range Status   Specimen Description URINE, CLEAN CATCH  Final   Special Requests NONE  Final   Culture   Final    NO GROWTH Performed at [facility], [place] [address] [place], [place] [address]    Report Status 04/25/2021 FINAL  Final       Radiology Studies: No results found.  Scheduled Meds:   stroke: mapping our early stages of recovery book   Does not apply Once   acyclovir  400 mg Oral BID   atorvastatin  10 mg Oral Daily   Chlorhexidine Gluconate Cloth  6 each Topical Daily   dexamethasone  8 mg Oral Q12H   levETIRAcetam  500 mg Oral BID   metoprolol succinate  25 mg Oral BID   pantoprazole  40 mg Oral QHS   raloxifene  60 mg Oral Daily   senna-docusate  1 tablet Oral BID   Continuous Infusions:  clevidipine       LOS: 5 days   Time spent: 25 minutes   Darliss Cheney, MD [facility]  04/28/2021, 11:07 AM   How to contact the [facility] Attending or Consulting provider [place] or covering provider during after hours [place], for this patient?  Check the care team in [facility] and look for a) attending/consulting [facility] provider listed and b) the [facility] team listed. Page or secure chat [phone]. Log into  [email or web address] and use 's universal password to access. If you do not have the password, please contact the hospital operator. Locate the [facility] provider you are looking for under [facility] and page to a number that you can be directly reached. If you still have difficulty reaching the provider, please page the [facility] (Director on Call) for the Hospitalists listed on amion for assistance.

## 2021-04-28 NOTE — Progress Notes (Signed)
Patient arrived to room on bed.  Awake, alert, oriented x4. Oriented to room, call light.  CHG bath given, foley care.  SCDs in place.  Patient positioned for comfort, call light within reach.  Patient stated she would notify her son about her move to 5W in the morning since she felt like it was late.

## 2021-04-29 DIAGNOSIS — I10 Essential (primary) hypertension: Secondary | ICD-10-CM | POA: Diagnosis not present

## 2021-04-29 NOTE — Progress Notes (Signed)
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  Patient was transferred under [place] on 04/25/2021.  Patient waiting for her surgery scheduled on 05/01/2021.  Assessment & Plan:   Active Problems:   ICH (intracerebral hemorrhage) (HCC)   Metastasis to brain ([place])   Hemiparesis ([place])  Intracranial hemorrhagic metastasis, primary mucosal melanoma/fall: Presented with right-sided weakness.  Still has flaccid paralysis in right upper and lower extremity.  Initially admitted under neurology who had consulted neurooncology as well as neurosurgery and transferred under hospitalist service. Continue dexamethasone and Keppra.  Neurosurgery saw this patient.  They offered surgery.  Patient agreeable to proceed.  Per neurosurgery, she is scheduled to have surgery on 7/262022 and they recommend patient to remain inpatient until then.  No change in patient's right sided flaccid paralysis.  Hyperlipidemia: Continue atorvastatin.  Hypertension: Controlled.  Continue Toprol-XL.  DVT prophylaxis: SCD's Start: 04/23/21 1323   Code Status: DNR  Family Communication:  None present at bedside.  Plan of care discussed with patient in length and he verbalized understanding and agreed with it.  Status is: Inpatient  Remains inpatient appropriate because: Pending surgical procedure scheduled on 05/03/2021.  Dispo: The patient is from: Home              Anticipated d/c is to: Home              Patient currently is not medically stable to d/c.   Difficult to place patient No        Estimated body mass index is 24.69 kg/m as calculated from the following:    Height as of this encounter: $RemoveBeforeD'5\' 5"'AOQkTIbfdAJPaW$  (1.651 m).   Weight as of this encounter: 67.3 kg.      Nutritional status:               Consultants:  Neurology Neuro-oncology Oncology Neurosurgery  Procedures:  None  Antimicrobials:  Anti-infectives (From admission, onward)    Start     Dose/Rate Route Frequency Ordered Stop   04/24/21 1330  acyclovir (ZOVIRAX) tablet 400 mg        400 mg Oral 2 times daily 04/24/21 1233            Subjective: Seen and examined.  No new complaint.  No improvement in weakness.  Objective: Vitals:   04/28/21 2300 04/29/21 0358 04/29/21 0755 04/29/21 1145  BP: 135/82 126/68 118/61 127/69  Pulse: 70 65 68 68  Resp: $Remo'18 18 16 16  'kVajF$ Temp: 98.7 F (37.1 C) 97.7 F (36.5 C) 98 F (36.7 C) 97.6 F (36.4 C)  TempSrc: Oral Oral Oral Oral  SpO2: 99% 97% 100% 100%  Weight:      Height:        Intake/Output Summary (Last 24 hours) at 04/29/2021 1209 Last data filed at 04/29/2021 0837 Gross per 24 hour  Intake 717 ml  Output 1700 ml  Net -983 ml    Filed Weights   04/23/21 1038 04/24/21 0604  Weight: 68 kg 67.3 kg    Examination:  General exam: Appears calm and comfortable  Respiratory system: Clear to auscultation. Respiratory effort normal. Cardiovascular system: S1 &  S2 heard, RRR. No JVD, murmurs, rubs, gallops or clicks. No pedal edema. Gastrointestinal system: Abdomen is nondistended, soft and nontender. No organomegaly or masses felt. Normal bowel sounds heard. Central nervous system: Alert and oriented.  Right-sided flaccid paralysis. Extremities: Symmetric 5 x 5 power. Skin: No rashes, lesions or ulcers.  Psychiatry: Judgement and insight appear normal. Mood & affect appropriate.   Data Reviewed: I have personally reviewed following labs and imaging studies  CBC: Recent Labs  Lab 04/23/21 1315 04/26/21 0752  WBC 22.1* 19.4*  NEUTROABS 17.3* 16.9*  HGB 15.4* 14.8  HCT 47.3* 44.7  MCV 97.3 95.3  PLT 282 266     Basic Metabolic Panel: Recent Labs  Lab 04/23/21 1315 04/24/21 0824 04/26/21 0752  NA 135 136 136  K 3.4* 3.9 3.9  CL 98 101 101  CO2 $Re'28 24 25  'RwJ$ GLUCOSE 105* 172* 115*  BUN 25* 22 25*  CREATININE 0.62 0.59 0.63  CALCIUM 9.0 9.1 9.1  MG 2.4  --  2.3  PHOS 3.3  --   --     GFR: Estimated Creatinine Clearance: 53 mL/min (by C-G formula based on SCr of 0.63 mg/dL). Liver Function Tests: Recent Labs  Lab 04/23/21 1315  AST 24  ALT 42  ALKPHOS 61  BILITOT 1.0  PROT 6.5  ALBUMIN 3.8    No results for input(s): LIPASE, AMYLASE in the last 168 hours. No results for input(s): AMMONIA in the last 168 hours. Coagulation Profile: Recent Labs  Lab 04/23/21 1315  INR 1.0    Cardiac Enzymes: No results for input(s): CKTOTAL, CKMB, CKMBINDEX, TROPONINI in the last 168 hours. BNP (last 3 results) No results for input(s): PROBNP in the last 8760 hours. HbA1C: No results for input(s): HGBA1C in the last 72 hours. CBG: Recent Labs  Lab 04/24/21 0603  GLUCAP 148*    Lipid Profile: No results for input(s): CHOL, HDL, LDLCALC, TRIG, CHOLHDL, LDLDIRECT in the last 72 hours. Thyroid Function Tests: No results for input(s): TSH, T4TOTAL, FREET4, T3FREE, THYROIDAB in the last 72 hours. Anemia Panel: No results for input(s): VITAMINB12, FOLATE, FERRITIN, TIBC, IRON, RETICCTPCT in the last 72 hours. Sepsis Labs: No results for input(s): PROCALCITON, LATICACIDVEN in the last 168 hours.  Recent Results (from the past 240 hour(s))  Resp Panel by RT-PCR (Flu A&B, Covid) Nasopharyngeal Swab     Status: None   Collection Time: 04/23/21  1:28 PM   Specimen: Nasopharyngeal Swab; Nasopharyngeal(NP) swabs in vial transport medium  Result Value Ref Range Status   SARS Coronavirus 2 by RT PCR NEGATIVE NEGATIVE Final    Comment: (NOTE) SARS-CoV-2 target nucleic acids are NOT DETECTED.  The SARS-CoV-2 RNA is generally detectable in upper respiratory specimens during the acute phase  of infection. The lowest concentration of SARS-CoV-2 viral copies this assay can detect is 138 copies/mL. A negative result does not preclude SARS-Cov-2 infection and should not be used as the sole basis for treatment or other patient management decisions. A negative result may occur with  improper specimen collection/handling, submission of specimen other than nasopharyngeal swab, presence of viral mutation(s) within the areas targeted by this assay, and inadequate number of viral copies(<138 copies/mL). A negative result must be combined with clinical observations, patient history, and epidemiological information. The expected result is Negative.  Fact Sheet for Patients:  EntrepreneurPulse.com.au  Fact Sheet for Healthcare Providers:  IncredibleEmployment.be  This test is no t yet approved or cleared by the [place] and  has been authorized  for detection and/or diagnosis of SARS-CoV-2 by FDA under an Emergency Use Authorization (EUA). This EUA will remain  in effect (meaning this test can be used) for the duration of the COVID-19 declaration under Section 564(b)(1) of the Act, 21 U.S.C.section 360bbb-3(b)(1), unless the authorization is terminated  or revoked sooner.       Influenza A by PCR NEGATIVE NEGATIVE Final   Influenza B by PCR NEGATIVE NEGATIVE Final    Comment: (NOTE) The Xpert Xpress SARS-CoV-2/FLU/RSV plus assay is intended as an aid in the diagnosis of influenza from Nasopharyngeal swab specimens and should not be used as a sole basis for treatment. Nasal washings and aspirates are unacceptable for Xpert Xpress SARS-CoV-2/FLU/RSV testing.  Fact Sheet for Patients: EntrepreneurPulse.com.au  Fact Sheet for Healthcare Providers: IncredibleEmployment.be  This test is not yet approved or cleared by the [place] FDA and has been authorized for detection and/or diagnosis of  SARS-CoV-2 by FDA under an Emergency Use Authorization (EUA). This EUA will remain in effect (meaning this test can be used) for the duration of the COVID-19 declaration under Section 564(b)(1) of the Act, 21 U.S.C. section 360bbb-3(b)(1), unless the authorization is terminated or revoked.  Performed at [facility], [place] [address] [place], [place] [address]   MRSA Next Gen by PCR, Nasal     Status: None   Collection Time: 04/24/21  6:06 AM   Specimen: Nasal Mucosa; Nasal Swab  Result Value Ref Range Status   MRSA by PCR Next Gen NOT DETECTED NOT DETECTED Final    Comment: (NOTE) The GeneXpert MRSA Assay (FDA approved for NASAL specimens only), is one component of a comprehensive MRSA colonization surveillance program. It is not intended to diagnose MRSA infection nor to guide or monitor treatment for MRSA infections. Test performance is not FDA approved in patients less than 78 years old. Performed at [facility], [place] [address] [place], [place] [address]   Urine Culture     Status: None   Collection Time: 04/24/21  8:53 AM   Specimen: Urine, Clean Catch  Result Value Ref Range Status   Specimen Description URINE, CLEAN CATCH  Final   Special Requests NONE  Final   Culture   Final    NO GROWTH Performed at [facility], [place] [address], [place], [place] [address]    Report Status 04/25/2021 FINAL  Final       Radiology Studies: No results found.  Scheduled Meds:   stroke: mapping our early stages of recovery book   Does not apply Once   acyclovir  400 mg Oral BID   atorvastatin  10 mg Oral Daily   Chlorhexidine Gluconate Cloth  6 each Topical Daily   dexamethasone  8 mg Oral Q12H   levETIRAcetam  500 mg Oral BID   metoprolol succinate  25 mg Oral BID   pantoprazole  40 mg Oral QHS   raloxifene  60 mg Oral Daily   senna-docusate  1 tablet Oral BID   Continuous Infusions:  clevidipine       LOS: 6 days   Time spent: 24 minutes   Darliss Cheney, MD [facility]  04/29/2021, 12:09 PM   How to contact the [facility] Attending or Consulting provider [place] or covering provider during after hours [place], for this patient?  Check the care team in [facility] and look for a) attending/consulting [facility] provider listed and b) the [facility] team listed. Page or secure chat [phone].  Log into [email or web address] and use [place]'s universal password to access. If you do not have the password, please contact the hospital operator. Locate the [facility] provider you are looking for under [facility] and page to a number that you can be directly reached. If you still have difficulty reaching the provider, please page the DOC (Director on Call) for the Hospitalists listed on amion for assistance.  

## 2021-04-29 NOTE — Progress Notes (Signed)
Overnight event  Notified by RN that patient had seizure-like activity which has resolved.  Described as shaking all over which lasted about 2 or 3 minutes.  No associated altered mental status and patient able to remember the event.  Discussed with neurology, suspecting possible focal seizure.  Recommendation is to obtain repeat head CT to check for expansion of bleed and also obtain routine EEG.  May need to increase dose of Decadron and Keppra based on CT results.  -Stat repeat head CT and routine EEG ordered

## 2021-04-29 NOTE — Plan of Care (Signed)
  Problem: Education: Goal: Knowledge of disease or condition will improve Outcome: Progressing Goal: Knowledge of secondary prevention will improve Outcome: Progressing Goal: Knowledge of patient specific risk factors addressed and post discharge goals established will improve Outcome: Progressing Goal: Individualized Educational Video(s) Outcome: Progressing   Problem: Nutrition: Goal: Dietary intake will improve Outcome: Progressing   Problem: Education: Goal: Knowledge of disease or condition will improve Outcome: Progressing Goal: Knowledge of secondary prevention will improve Outcome: Progressing Goal: Knowledge of patient specific risk factors addressed and post discharge goals established will improve Outcome: Progressing Goal: Individualized Educational Video(s) Outcome: Progressing   Problem: Nutrition: Goal: Dietary intake will improve Outcome: Progressing   Problem: Education: Goal: Knowledge of General Education information will improve Description: Including pain rating scale, medication(s)/side effects and non-pharmacologic comfort measures Outcome: Progressing   Problem: Health Behavior/Discharge Planning: Goal: Ability to manage health-related needs will improve Outcome: Progressing   Problem: Clinical Measurements: Goal: Ability to maintain clinical measurements within normal limits will improve Outcome: Progressing Goal: Will remain free from infection Outcome: Progressing Goal: Diagnostic test results will improve Outcome: Progressing Goal: Cardiovascular complication will be avoided Outcome: Progressing   Problem: Activity: Goal: Risk for activity intolerance will decrease Outcome: Progressing   Problem: Nutrition: Goal: Adequate nutrition will be maintained Outcome: Progressing   Problem: Coping: Goal: Level of anxiety will decrease Outcome: Progressing   Problem: Elimination: Goal: Will not experience complications related to bowel  motility Outcome: Progressing Goal: Will not experience complications related to urinary retention Outcome: Progressing   Problem: Pain Managment: Goal: General experience of comfort will improve Outcome: Progressing   Problem: Safety: Goal: Ability to remain free from injury will improve Outcome: Progressing   Problem: Skin Integrity: Goal: Risk for impaired skin integrity will decrease Outcome: Progressing   Problem: Clinical Measurements: Goal: Respiratory complications will improve Outcome: Progressing

## 2021-04-30 ENCOUNTER — Other Ambulatory Visit: Payer: Self-pay | Admitting: Radiation Therapy

## 2021-04-30 ENCOUNTER — Inpatient Hospital Stay (HOSPITAL_COMMUNITY): Payer: Medicare Other

## 2021-04-30 DIAGNOSIS — I611 Nontraumatic intracerebral hemorrhage in hemisphere, cortical: Secondary | ICD-10-CM | POA: Diagnosis not present

## 2021-04-30 DIAGNOSIS — I10 Essential (primary) hypertension: Secondary | ICD-10-CM | POA: Diagnosis not present

## 2021-04-30 LAB — BASIC METABOLIC PANEL
Anion gap: 8 (ref 5–15)
BUN: 20 mg/dL (ref 8–23)
CO2: 23 mmol/L (ref 22–32)
Calcium: 8.6 mg/dL — ABNORMAL LOW (ref 8.9–10.3)
Chloride: 100 mmol/L (ref 98–111)
Creatinine, Ser: 0.65 mg/dL (ref 0.44–1.00)
GFR, Estimated: >60 mL/min (ref >60)
Glucose, Bld: 132 mg/dL — ABNORMAL HIGH (ref 70–99)
Potassium: 3.9 mmol/L (ref 3.5–5.1)
Sodium: 131 mmol/L — ABNORMAL LOW (ref 135–145)

## 2021-04-30 LAB — CBC WITH DIFFERENTIAL/PLATELET
Abs Immature Granulocytes: 0.81 10*3/uL — ABNORMAL HIGH (ref 0.00–0.07)
Basophils Absolute: 0.1 10*3/uL (ref 0.0–0.1)
Basophils Relative: 1 %
Eosinophils Absolute: 0 10*3/uL (ref 0.0–0.5)
Eosinophils Relative: 0 %
HCT: 44.5 % (ref 36.0–46.0)
Hemoglobin: 15.1 g/dL — ABNORMAL HIGH (ref 12.0–15.0)
Immature Granulocytes: 5 %
Lymphocytes Relative: 8 %
Lymphs Abs: 1.4 10*3/uL (ref 0.7–4.0)
MCH: 32.1 pg (ref 26.0–34.0)
MCHC: 33.9 g/dL (ref 30.0–36.0)
MCV: 94.7 fL (ref 80.0–100.0)
Monocytes Absolute: 0.9 10*3/uL (ref 0.1–1.0)
Monocytes Relative: 5 %
Neutro Abs: 14.2 10*3/uL — ABNORMAL HIGH (ref 1.7–7.7)
Neutrophils Relative %: 81 %
Platelets: 258 10*3/uL (ref 150–400)
RBC: 4.7 MIL/uL (ref 3.87–5.11)
RDW: 13.5 % (ref 11.5–15.5)
WBC: 17.5 10*3/uL — ABNORMAL HIGH (ref 4.0–10.5)
nRBC: 0 % (ref 0.0–0.2)

## 2021-04-30 LAB — MAGNESIUM: Magnesium: 2.2 mg/dL (ref 1.7–2.4)

## 2021-04-30 IMAGING — CT CT HEAD W/O CM
3 series · 15 of 47 positions shown, 18 images · non-contrast
Comparison: [DATE]

CLINICAL DATA: Seizure.  Hemorrhage follow-up

EXAM:
CT HEAD WITHOUT CONTRAST
TECHNIQUE: Contiguous axial images were obtained from the base of the skull
through the vertex without intravenous contrast.

[Series 3: head 5.0 h30s · axial · 0.41mm/px · z∈[-142,+3]mm · 9 of 35 slices shown, 12 images]
[im 3/35  brain]
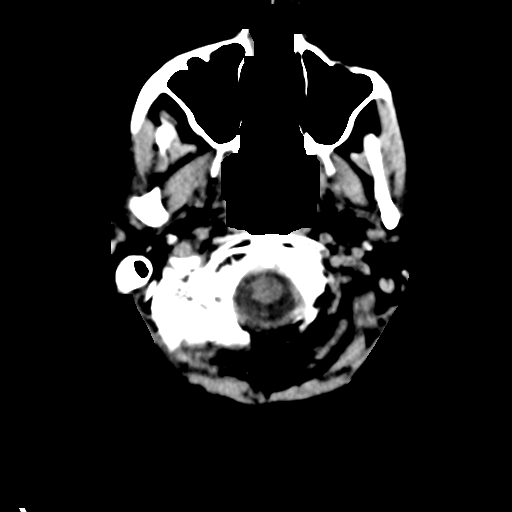
[im 3/35  bone]
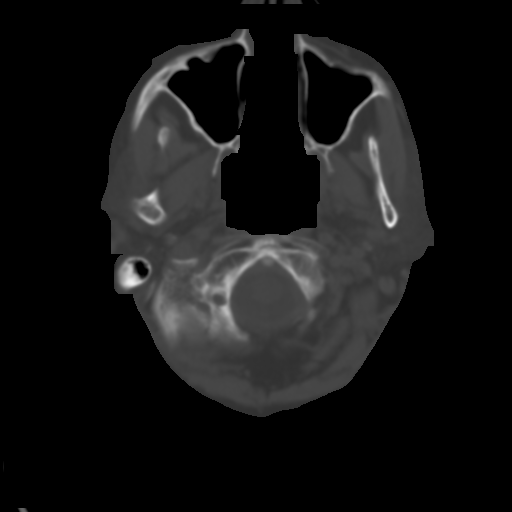
[im 6/35  brain]
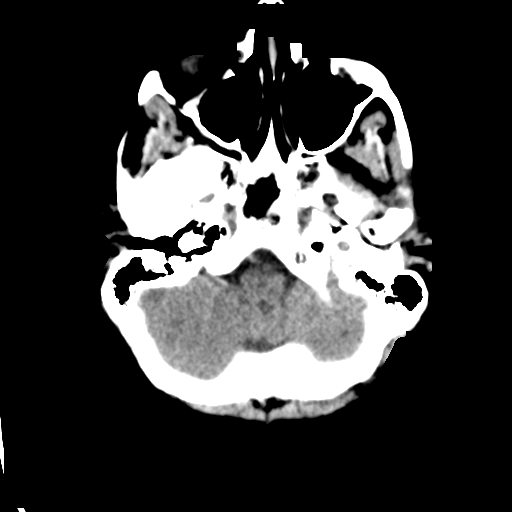
[im 10/35  brain]
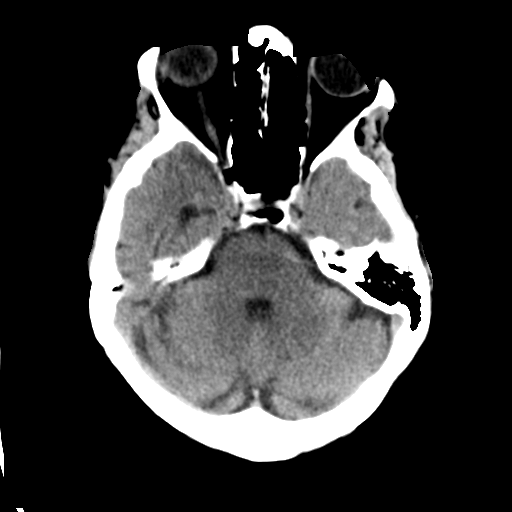
[im 13/35  brain]
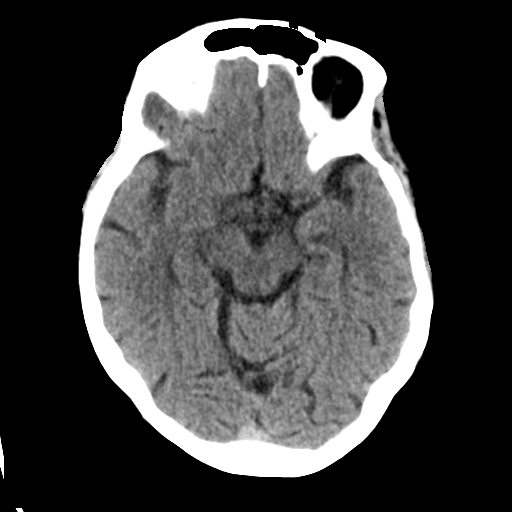
[im 18/35  brain]
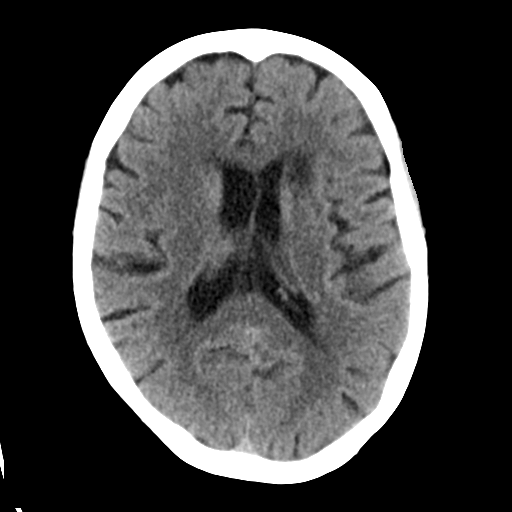
[im 18/35  bone]
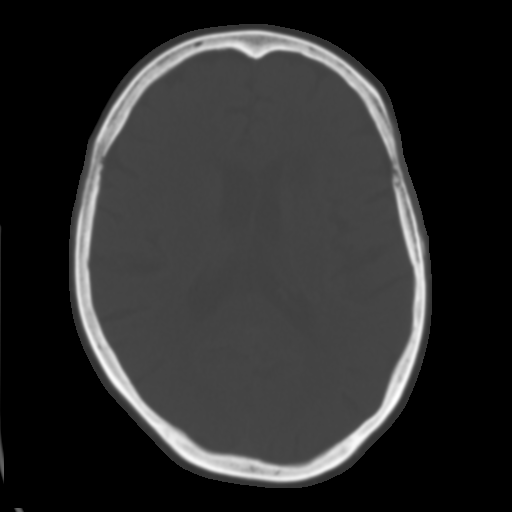
[im 22/35  brain]
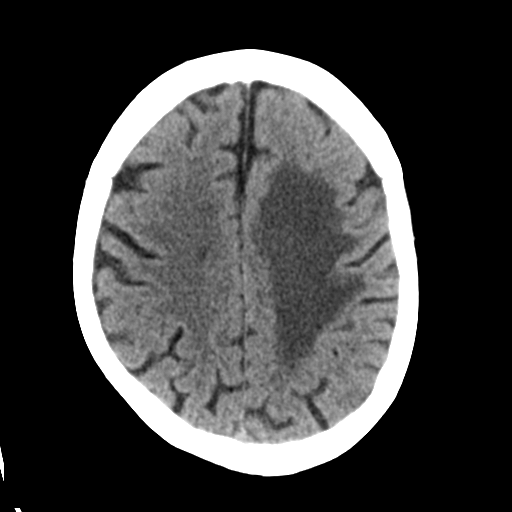
[im 25/35  brain]
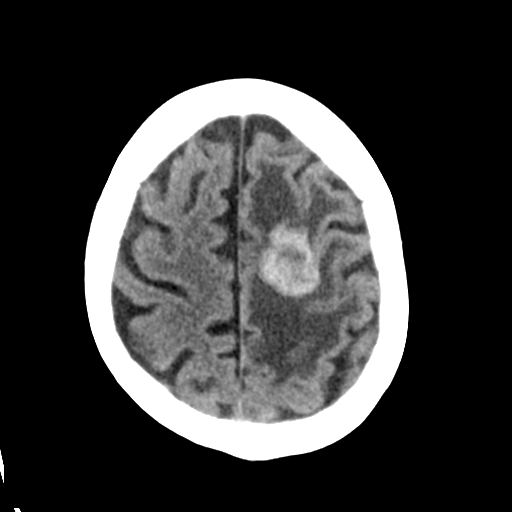
[im 29/35  brain]
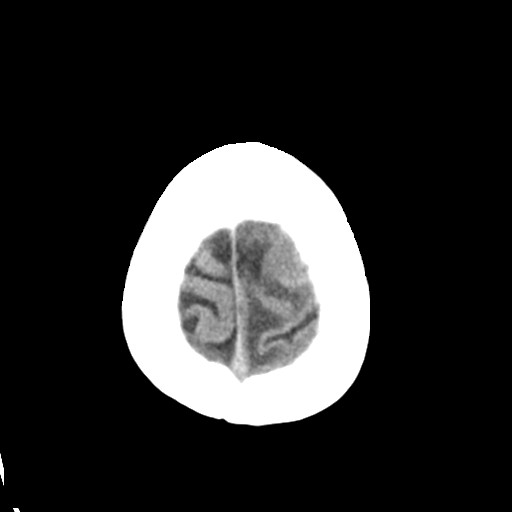
[im 32/35  brain]
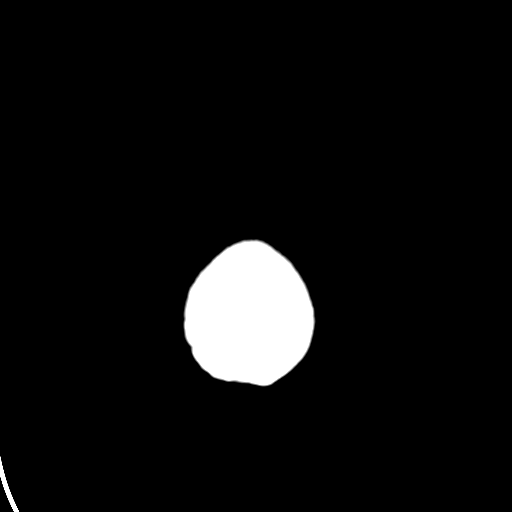
[im 32/35  bone]
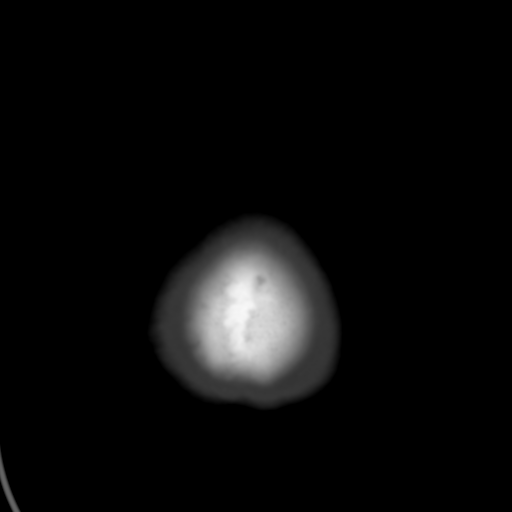

[Series 5: head 3.0 mpr cor · coronal · 0.33mm/px · 3 of 67 slices shown]
[im 26/67  brain]
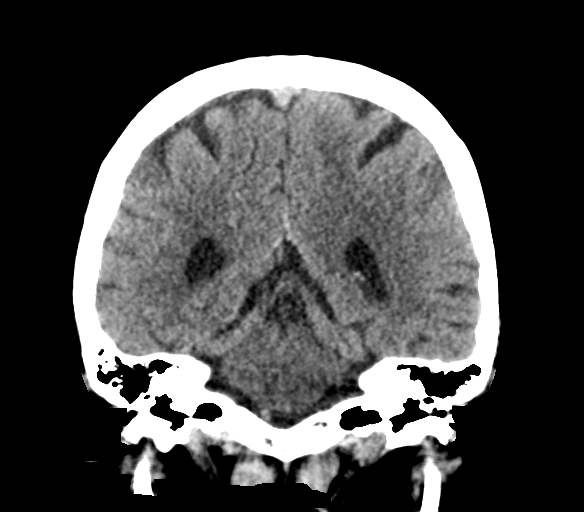
[im 31/67  brain]
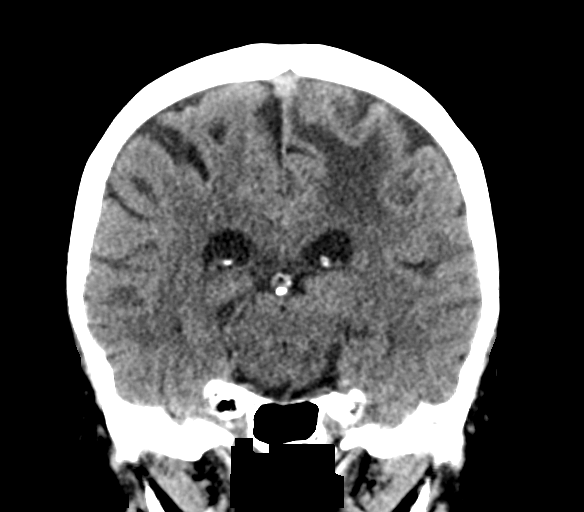
[im 36/67  brain]
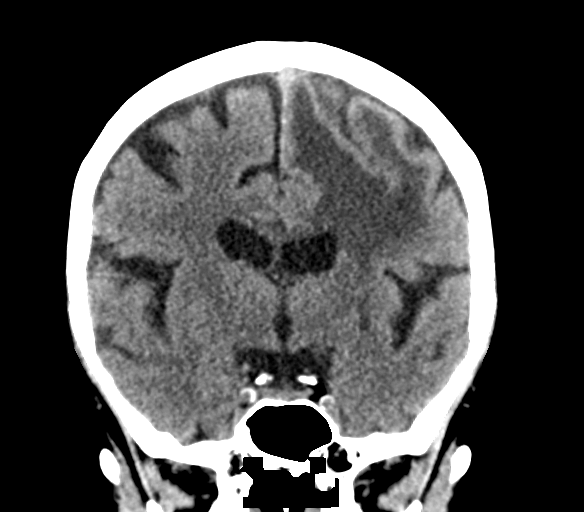

[Series 6: head 3.0 mpr sag · sagittal · 0.33mm/px · 3 of 67 slices shown]
[im 23/67  brain]
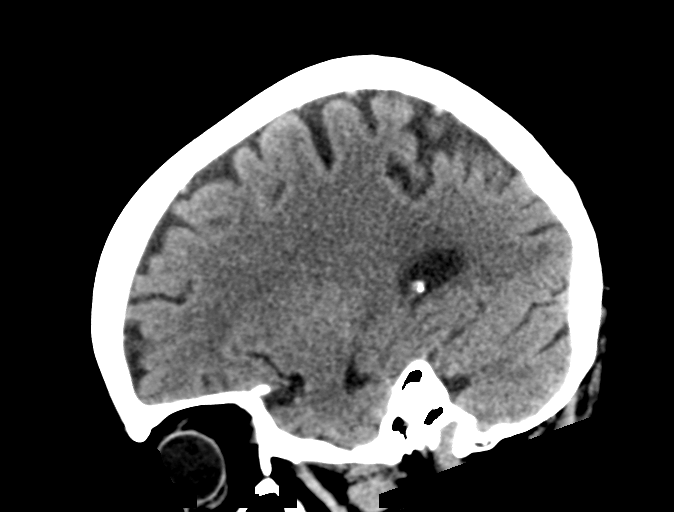
[im 34/67  brain]
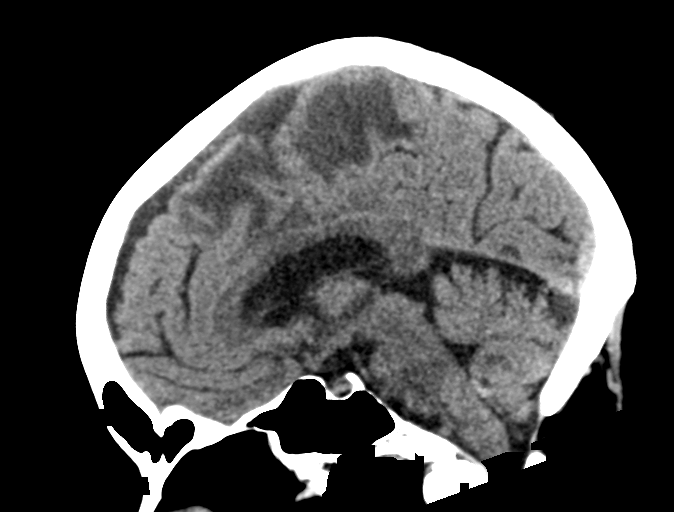
[im 45/67  brain]
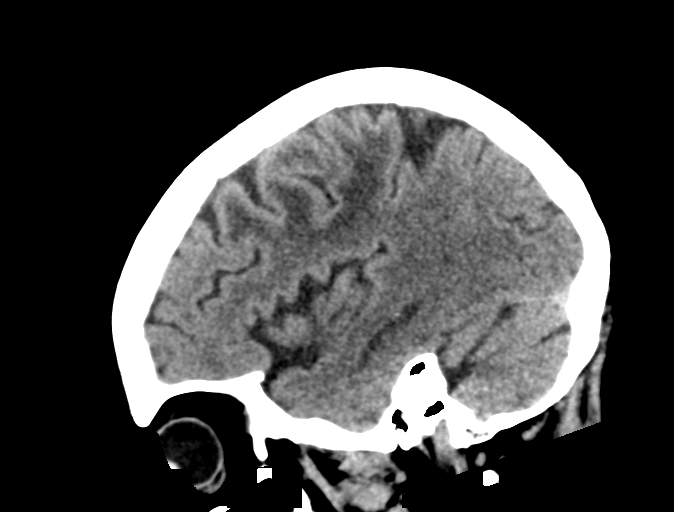

[15 of 47 positions shown; findings below may reference images not displayed]

FINDINGS: Brain: Unchanged size of intraparenchymal hematoma in the superior
left hemisphere with surrounding edema. No new site of hemorrhage.
No midline shift or herniation.

Vascular: No hyperdense vessel or unexpected calcification.

Skull: Normal. Negative for fracture or focal lesion.

Sinuses/Orbits: No acute finding.

Other: None.
IMPRESSION: Unchanged size of intraparenchymal hematoma in the superior left
hemisphere with surrounding edema.

## 2021-04-30 MED ORDER — LEVETIRACETAM 750 MG PO TABS
750.0000 mg | ORAL_TABLET | Freq: Two times a day (BID) | ORAL | Status: DC
  Administered 2021-04-30 – 2021-05-03 (×6): 750 mg via ORAL
  Filled 2021-04-30 (×6): qty 1

## 2021-04-30 NOTE — Procedures (Signed)
Patient Name: CLEOFAS HAYNER  MRN: [id number]  Epilepsy Attending: Lora Havens  Referring Physician/Provider: Dr Derrick Ravel Date: 04/30/2021 Duration: 29.21 mins  Patient history: 80 year old female with seizure-like activity.  EEG to evaluate for seizures.  Level of alertness: Awake, asleep  AEDs during EEG study: LEV  Technical aspects: This EEG study was done with scalp electrodes positioned according to the 10-20 International system of electrode placement. Electrical activity was acquired at a sampling rate of '500Hz'$  and reviewed with a high frequency filter of '70Hz'$  and a low frequency filter of '1Hz'$ . EEG data were recorded continuously and digitally stored.   Description: The posterior dominant rhythm consists of 9 Hz activity of moderate voltage (25-35 uV) seen predominantly in posterior head regions, asymmetric ( left<right) and reactive to eye opening and eye closing. Sleep was characterized by vertex waves, sleep spindles (12 to 14 Hz), maximal frontocentral region.  EEG also showed intermittent left temporal 3 to 5 Hz theta-delta slowing.  Hyperventilation and photic stimulation were not performed.     ABNORMALITY - Intermittent slow, left temporal region - Background asymmetry, left<right  IMPRESSION: This study is suggestive of cortical dysfunction in left temporal region likely secondary to underlying hemorrhage. No seizures or epileptiform discharges were seen throughout the recording.  Dewon Mendizabal Barbra Sarks

## 2021-04-30 NOTE — Care Management Important Message (Signed)
Important Message  Patient Details  Name: Angela Carpenter MRN: [id number] Date of Birth: April 03, 1941   Medicare Important Message Given:  Yes     Orbie Pyo 04/30/2021, 4:49 PM

## 2021-04-30 NOTE — Progress Notes (Signed)
EEG complete - results pending 

## 2021-04-30 NOTE — Progress Notes (Signed)
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  Patient was transferred under [place] on 04/25/2021.  Patient waiting for her surgery scheduled on 05/01/2021.  Patient had 2 to 3 minutes of seizure like activity on the night of 04/29/2021.  Assessment & Plan:   Active Problems:   ICH (intracerebral hemorrhage) (HCC)   Metastasis to brain ([place])   Hemiparesis ([place])  Intracranial hemorrhagic metastasis, primary mucosal melanoma/fall: Presented with right-sided weakness.  Still has flaccid paralysis in right upper and lower extremity.  Initially admitted under neurology who had consulted neurooncology as well as neurosurgery and transferred under hospitalist service. Continue dexamethasone and Keppra.  Neurosurgery saw this patient.  They offered surgery.  Patient agreeable to proceed.  Per neurosurgery, she is scheduled to have surgery on 7/262022 and they recommend patient to remain inpatient until then.  No change in patient's right sided flaccid paralysis.  Seizure-like activity: Patient was noted to have seizure-like activity on the night of 04/29/2021 which lasted for 2 to 3 minutes.  Patient did not require any medications.  According to patient, she was conscious the whole time.  Repeat CT scan was done which showed unchanged size of intraparenchymal hematoma in superior left hemisphere with surrounding edema. EEG was done today which showed no epileptiform activities but cortical dysfunction in left temporal region likely secondary to underlying hemorrhage.  She is already on Keppra.  Will monitor.  Hyperlipidemia: Continue atorvastatin.  Hypertension:  Controlled.  Continue Toprol-XL.  DVT prophylaxis: SCD's Start: 04/23/21 1323   Code Status: DNR  Family Communication:  None present at bedside.  Plan of care discussed with patient in length and he verbalized understanding and agreed with it.  Status is: Inpatient  Remains inpatient appropriate because: Pending surgical procedure scheduled on 05/03/2021.  Dispo: The patient is from: Home              Anticipated d/c is to: Home              Patient currently is not medically stable to d/c.   Difficult to place patient No        Estimated body mass index is 24.69 kg/m as calculated from the following:   Height as of this encounter: '5\' 5"'  (1.651 m).   Weight as of this encounter: 67.3 kg.      Nutritional status:               Consultants:  Neurology Neuro-oncology Oncology Neurosurgery  Procedures:  None  Antimicrobials:  Anti-infectives (From admission, onward)    Start     Dose/Rate Route Frequency Ordered Stop   04/24/21 1330  acyclovir (ZOVIRAX) tablet 400 mg        400 mg Oral 2 times daily 04/24/21 1233            Subjective: Seen and examined this morning.  No complaints.  Objective: Vitals:   04/29/21 2012 04/29/21 2352 04/30/21 0353 04/30/21 0830  BP: 133/75 140/74 136/71 133/71  Pulse: 99 74 70 82  Resp: '18 17 17 17  ' Temp: 98.1 F (36.7 C) 98.1 F (36.7 C) 97.7 F (36.5 C) 98.2  F (36.8 C)  TempSrc: Oral Oral Oral Oral  SpO2: 100% 98% 97% 98%  Weight:      Height:        Intake/Output Summary (Last 24 hours) at 04/30/2021 1102 Last data filed at 04/30/2021 0830 Gross per 24 hour  Intake 480 ml  Output 1400 ml  Net -920 ml    Filed Weights   04/23/21 1038 04/24/21 0604  Weight: 68 kg 67.3 kg    Examination:  General exam: Appears calm and comfortable  Respiratory system: Clear to auscultation. Respiratory effort normal. Cardiovascular system: S1 & S2 heard, RRR. No JVD, murmurs, rubs, gallops or clicks. No pedal  edema. Gastrointestinal system: Abdomen is nondistended, soft and nontender. No organomegaly or masses felt. Normal bowel sounds heard. Central nervous system: Alert and oriented.  Right-sided flaccid paralysis. Extremities: Symmetric 5 x 5 power. Skin: No rashes, lesions or ulcers.  Psychiatry: Judgement and insight appear normal. Mood & affect appropriate.    Data Reviewed: I have personally reviewed following labs and imaging studies  CBC: Recent Labs  Lab 04/23/21 1315 04/26/21 0752 04/30/21 0849  WBC 22.1* 19.4* 17.5*  NEUTROABS 17.3* 16.9* 14.2*  HGB 15.4* 14.8 15.1*  HCT 47.3* 44.7 44.5  MCV 97.3 95.3 94.7  PLT 282 266 010    Basic Metabolic Panel: Recent Labs  Lab 04/23/21 1315 04/24/21 0824 04/26/21 0752 04/30/21 0849  NA 135 136 136 131*  K 3.4* 3.9 3.9 3.9  CL 98 101 101 100  CO2 '28 24 25 23  ' GLUCOSE 105* 172* 115* 132*  BUN 25* 22 25* 20  CREATININE 0.62 0.59 0.63 0.65  CALCIUM 9.0 9.1 9.1 8.6*  MG 2.4  --  2.3 2.2  PHOS 3.3  --   --   --     GFR: Estimated Creatinine Clearance: 53 mL/min (by C-G formula based on SCr of 0.65 mg/dL). Liver Function Tests: Recent Labs  Lab 04/23/21 1315  AST 24  ALT 42  ALKPHOS 61  BILITOT 1.0  PROT 6.5  ALBUMIN 3.8    No results for input(s): LIPASE, AMYLASE in the last 168 hours. No results for input(s): AMMONIA in the last 168 hours. Coagulation Profile: Recent Labs  Lab 04/23/21 1315  INR 1.0    Cardiac Enzymes: No results for input(s): CKTOTAL, CKMB, CKMBINDEX, TROPONINI in the last 168 hours. BNP (last 3 results) No results for input(s): PROBNP in the last 8760 hours. HbA1C: No results for input(s): HGBA1C in the last 72 hours. CBG: Recent Labs  Lab 04/24/21 0603  GLUCAP 148*    Lipid Profile: No results for input(s): CHOL, HDL, LDLCALC, TRIG, CHOLHDL, LDLDIRECT in the last 72 hours. Thyroid Function Tests: No results for input(s): TSH, T4TOTAL, FREET4, T3FREE, THYROIDAB in the last 72  hours. Anemia Panel: No results for input(s): VITAMINB12, FOLATE, FERRITIN, TIBC, IRON, RETICCTPCT in the last 72 hours. Sepsis Labs: No results for input(s): PROCALCITON, LATICACIDVEN in the last 168 hours.  Recent Results (from the past 240 hour(s))  Resp Panel by RT-PCR (Flu A&B, Covid) Nasopharyngeal Swab     Status: None   Collection Time: 04/23/21  1:28 PM   Specimen: Nasopharyngeal Swab; Nasopharyngeal(NP) swabs in vial transport medium  Result Value Ref Range Status   SARS Coronavirus 2 by RT PCR NEGATIVE NEGATIVE Final    Comment: (NOTE) SARS-CoV-2 target nucleic acids are NOT DETECTED.  The SARS-CoV-2 RNA is generally detectable in upper respiratory specimens during the acute phase of infection. The lowest concentration  of SARS-CoV-2 viral copies this assay can detect is 138 copies/mL. A negative result does not preclude SARS-Cov-2 infection and should not be used as the sole basis for treatment or other patient management decisions. A negative result may occur with  improper specimen collection/handling, submission of specimen other than nasopharyngeal swab, presence of viral mutation(s) within the areas targeted by this assay, and inadequate number of viral copies(<138 copies/mL). A negative result must be combined with clinical observations, patient history, and epidemiological information. The expected result is Negative.  Fact Sheet for Patients:  EntrepreneurPulse.com.au  Fact Sheet for Healthcare Providers:  IncredibleEmployment.be  This test is no t yet approved or cleared by the [place] FDA and  has been authorized for detection and/or diagnosis of SARS-CoV-2 by FDA under an Emergency Use Authorization (EUA). This EUA will remain  in effect (meaning this test can be used) for the duration of the COVID-19 declaration under Section 564(b)(1) of the Act, 21 U.S.C.section 360bbb-3(b)(1), unless the authorization is  terminated  or revoked sooner.       Influenza A by PCR NEGATIVE NEGATIVE Final   Influenza B by PCR NEGATIVE NEGATIVE Final    Comment: (NOTE) The Xpert Xpress SARS-CoV-2/FLU/RSV plus assay is intended as an aid in the diagnosis of influenza from Nasopharyngeal swab specimens and should not be used as a sole basis for treatment. Nasal washings and aspirates are unacceptable for Xpert Xpress SARS-CoV-2/FLU/RSV testing.  Fact Sheet for Patients: EntrepreneurPulse.com.au  Fact Sheet for Healthcare Providers: IncredibleEmployment.be  This test is not yet approved or cleared by the [place] FDA and has been authorized for detection and/or diagnosis of SARS-CoV-2 by FDA under an Emergency Use Authorization (EUA). This EUA will remain in effect (meaning this test can be used) for the duration of the COVID-19 declaration under Section 564(b)(1) of the Act, 21 U.S.C. section 360bbb-3(b)(1), unless the authorization is terminated or revoked.  Performed at [facility], [place] [address] [place], [place] [address]   MRSA Next Gen by PCR, Nasal     Status: None   Collection Time: 04/24/21  6:06 AM   Specimen: Nasal Mucosa; Nasal Swab  Result Value Ref Range Status   MRSA by PCR Next Gen NOT DETECTED NOT DETECTED Final    Comment: (NOTE) The GeneXpert MRSA Assay (FDA approved for NASAL specimens only), is one component of a comprehensive MRSA colonization surveillance program. It is not intended to diagnose MRSA infection nor to guide or monitor treatment for MRSA infections. Test performance is not FDA approved in patients less than 17 years old. Performed at [facility], [place] [address], [place], [place] [address]   Urine Culture     Status: None   Collection Time: 04/24/21  8:53 AM   Specimen: Urine, Clean Catch  Result Value Ref Range Status   Specimen Description URINE, CLEAN CATCH  Final   Special Requests NONE  Final    Culture   Final    NO GROWTH Performed at [facility], [place] [address] [place], [place] [address]    Report Status 04/25/2021 FINAL  Final       Radiology Studies: CT HEAD WO CONTRAST  Result Date: 04/30/2021 CLINICAL DATA:  Seizure.  Hemorrhage follow-up EXAM: CT HEAD WITHOUT CONTRAST TECHNIQUE: Contiguous axial images were obtained from the base of the skull through the vertex without intravenous contrast. COMPARISON:  04/24/2021 FINDINGS: Brain: Unchanged size of intraparenchymal hematoma in the superior left hemisphere with surrounding edema.  No new site of hemorrhage. No midline shift or herniation. Vascular: No hyperdense vessel or unexpected calcification. Skull: Normal. Negative for fracture or focal lesion. Sinuses/Orbits: No acute finding. Other: None. IMPRESSION: Unchanged size of intraparenchymal hematoma in the superior left hemisphere with surrounding edema. Electronically Signed   By: Ulyses Jarred M.D.   On: 04/30/2021 00:16   EEG adult  Result Date: 04/30/2021 Lora Havens, MD     04/30/2021 10:49 AM Patient Name: Angela Carpenter MRN: [id number] Epilepsy Attending: Lora Havens Referring Physician/Provider: Dr Derrick Ravel Date: 04/30/2021 Duration: 29.21 mins Patient history: 80 year old female with seizure-like activity.  EEG to evaluate for seizures. Level of alertness: Awake, asleep AEDs during EEG study: LEV Technical aspects: This EEG study was done with scalp electrodes positioned according to the 10-20 International system of electrode placement. Electrical activity was acquired at a sampling rate of '500Hz'  and reviewed with a high frequency filter of '70Hz'  and a low frequency filter of '1Hz' . EEG data were recorded continuously and digitally stored. Description: The posterior dominant rhythm consists of 9 Hz activity of moderate voltage (25-35 uV) seen predominantly in posterior head regions, asymmetric ( left<right) and reactive to eye opening and eye closing.  Sleep was characterized by vertex waves, sleep spindles (12 to 14 Hz), maximal frontocentral region.  EEG also showed intermittent left temporal 3 to 5 Hz theta-delta slowing.  Hyperventilation and photic stimulation were not performed.   ABNORMALITY - Intermittent slow, left temporal region - Background asymmetry, left<right IMPRESSION: This study is suggestive of cortical dysfunction in left temporal region likely secondary to underlying hemorrhage. No seizures or epileptiform discharges were seen throughout the recording. Priyanka Barbra Sarks    Scheduled Meds:   stroke: mapping our early stages of recovery book   Does not apply Once   acyclovir  400 mg Oral BID   atorvastatin  10 mg Oral Daily   Chlorhexidine Gluconate Cloth  6 each Topical Daily   dexamethasone  8 mg Oral Q12H   levETIRAcetam  500 mg Oral BID   metoprolol succinate  25 mg Oral BID   pantoprazole  40 mg Oral QHS   raloxifene  60 mg Oral Daily   senna-docusate  1 tablet Oral BID   Continuous Infusions:  clevidipine       LOS: 7 days   Time spent: 26 minutes   Darliss Cheney, MD [facility]  04/30/2021, 11:02 AM   How to contact the [facility] Attending or Consulting provider [place] or covering provider during after hours [place], for this patient?  Check the care team in [facility] and look for a) attending/consulting [facility] provider listed and b) the [facility] team listed. Page or secure chat [phone]. Log into [email or web address] and use [place]'s universal password to access. If you do not have the password, please contact the hospital operator. Locate the [facility] provider you are looking for under [facility] and page to a number that you can be directly reached. If you still have difficulty reaching the provider, please page the [facility] (Director on Call) for the Hospitalists listed on amion for assistance.

## 2021-04-30 NOTE — Progress Notes (Signed)
  NEUROSURGERY PROGRESS NOTE   Pt had involuntary right arm twitching lasting for a minute or two yesterday. No alteration in LOC. Otherwise no complaints.  EXAM:  BP 128/75 (BP Location: Right Arm)   Pulse 79   Temp 99.1 F (37.3 C) (Oral)   Resp 17   Ht '5\' 5"'$  (1.651 m)   Wt 67.3 kg   SpO2 98%   BMI 24.69 kg/m   Awake, alert, oriented  Speech fluent, appropriate  CN grossly intact  5/5 LUE/LLE 0/5 RUE/RLE  IMAGING: Repeat CTH reviewed and is stable in comparison to prior CT. No change in size of hemorrhagic right frontal lesion.   IMPRESSION:  80 y.o. female with enlargement of left frontal melanoma metastasis and associated right hemiplegia. New likely focal motor seizure.  PLAN: - Will increase Keppra to '750mg'$  BID - Plan on proceeding with left frontal craniotomy for resection of tumor tomorrow.  I again reviewed the plan above with the patient and her son and sister-in-law. All their questions were answered.    Consuella Lose, MD [facility]

## 2021-04-30 NOTE — Progress Notes (Signed)
Physical Therapy Treatment Patient Details Name: Angela Carpenter MRN: [id number] DOB: Feb 22, 1941 Today's Date: 04/30/2021    History of Present Illness pt is a 80 y/o female presenting to ED with worsening R sided weakness in setting of marked expansion of L frotnal hemorrhagic met with surrounding vasogenic edema. Patient now completely flaccid in RUE/RLE with dysarthria. Neurosurgery following for surgical intervention. 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    PT Comments    Patient received in bed, very motivated to work with therapy- tells me she was very active before and really wants to get back to this. Able to mobilize well today but still requires +2 assist for more dynamic tasks in standing and sitting. Worked on weight shifting right, R quad activation, and standing balance with midline stance and no UE support in steady with 2 persons for safety. Transferred to chair but ultimately had to return to bed for EEG study. Left in bed with all needs met, bed alarm active and EEG staff attending. Continue to recommend CIR.     Follow Up Recommendations  CIR     Equipment Recommendations  Other (comment) (TBA)    Recommendations for Other Services       Precautions / Restrictions Precautions Precautions: Fall Precaution Comments: R hemiplegia Restrictions Weight Bearing Restrictions: No RLE Weight Bearing: Weight bearing as tolerated    Mobility  Bed Mobility Overal bed mobility: Needs Assistance Bed Mobility: Supine to Sit;Sit to Supine     Supine to sit: Mod assist;HOB elevated Sit to supine: Max assist;+2 for physical assistance   General bed mobility comments: ModAx1 with HOB elevated to scoot hips to being square at EOB; MaxAX2 to return to bed for time management for EEG setup     Transfers Overall transfer level: Needs assistance Equipment used: Ambulation equipment used Transfers: Sit to/from Stand;Stand Pivot Transfers Sit to Stand: Min assist Stand pivot transfers: Min assist;+2 physical assistance       General transfer comment: MinAx1 and min guard of second person for safety wtih stedy; MinAx2 for balance when pivoting in stedy  Ambulation/Gait             General Gait Details: Did not attempt   Stairs             Wheelchair Mobility    Modified Rankin (Stroke Patients Only)       Balance Overall balance assessment: Needs assistance Sitting-balance support: Feet supported;No upper extremity supported Sitting balance-Leahy Scale: Fair Sitting balance - Comments: Min guard to supervision A to maintain static sitting balance at EOB with unilateral UE support on bed surface.   Standing balance support: Single extremity supported Standing balance-Leahy Scale: Poor                              Cognition Arousal/Alertness: Awake/alert Behavior During Therapy: WFL for tasks assessed/performed Overall Cognitive Status: Within Functional Limits for tasks assessed                                 General Comments: mildly confused- insists that she has only had one seizure even though she frequently refers to her seizure earlier this year as well as seizure last night. Some difficulty sequencing  but able to follow commands well      Exercises      General Comments        Pertinent Vitals/Pain Pain Assessment: No/denies pain    Home Living                      Prior Function            PT Goals (current goals can now be found in the care plan section) Acute Rehab PT Goals Patient Stated Goal: To go to CIR PT Goal Formulation: With patient Time For Goal Achievement: 05/08/21 Potential to Achieve Goals: Good Progress towards PT goals: Progressing toward goals    Frequency    Min  4X/week      PT Plan Current plan remains appropriate    Co-evaluation              AM-PAC PT "6 Clicks" Mobility   Outcome Measure  Help needed turning from your back to your side while in a flat bed without using bedrails?: A Little Help needed moving from lying on your back to sitting on the side of a flat bed without using bedrails?: A Lot Help needed moving to and from a bed to a chair (including a wheelchair)?: A Lot Help needed standing up from a chair using your arms (e.g., wheelchair or bedside chair)?: A Lot Help needed to walk in hospital room?: Total Help needed climbing 3-5 steps with a railing? : Total 6 Click Score: 11    End of Session Equipment Utilized During Treatment: Gait belt Activity Tolerance: Patient tolerated treatment well Patient left: in bed;with call bell/phone within reach;with bed alarm set;Other (comment) (EEG tech present and attending) Nurse Communication: Mobility status PT Visit Diagnosis: Unsteadiness on feet (R26.81);Other abnormalities of gait and mobility (R26.89)     Time: 0388-8280 PT Time Calculation (min) (ACUTE ONLY): 23 min  Charges:  $Therapeutic Activity: 23-37 mins                    Windell Norfolk, DPT, PN1   Supplemental Physical Therapist [place]    Pager [phone] Acute Rehab Office [phone]

## 2021-05-01 ENCOUNTER — Inpatient Hospital Stay (HOSPITAL_COMMUNITY): Payer: Medicare Other | Admitting: Anesthesiology

## 2021-05-01 ENCOUNTER — Encounter (HOSPITAL_COMMUNITY): Payer: Self-pay | Admitting: Neurology

## 2021-05-01 ENCOUNTER — Encounter (HOSPITAL_COMMUNITY)
Admission: EM | Disposition: A | Discharge status: Rehabilitation Facility | Payer: Self-pay | Source: Home / Self Care | Attending: Family Medicine

## 2021-05-01 DIAGNOSIS — I10 Essential (primary) hypertension: Secondary | ICD-10-CM | POA: Diagnosis not present

## 2021-05-01 HISTORY — PX: CRANIOTOMY: SHX93

## 2021-05-01 HISTORY — PX: APPLICATION OF CRANIAL NAVIGATION: SHX6578

## 2021-05-01 LAB — TYPE AND SCREEN
ABO/RH(D): O NEG
Antibody Screen: NEGATIVE

## 2021-05-01 LAB — ABO/RH: ABO/RH(D): O NEG

## 2021-05-01 SURGERY — CRANIOTOMY TUMOR EXCISION
Anesthesia: General | Site: Head | Laterality: Left

## 2021-05-01 MED ORDER — LABETALOL HCL 5 MG/ML IV SOLN
INTRAVENOUS | Status: AC
  Filled 2021-05-01: qty 4

## 2021-05-01 MED ORDER — FENTANYL CITRATE (PF) 250 MCG/5ML IJ SOLN
INTRAMUSCULAR | Status: DC | PRN
  Administered 2021-05-01: 100 ug via INTRAVENOUS

## 2021-05-01 MED ORDER — BUPIVACAINE HCL (PF) 0.5 % IJ SOLN
INTRAMUSCULAR | Status: DC | PRN
  Administered 2021-05-01: 5 mL

## 2021-05-01 MED ORDER — PHENYLEPHRINE 40 MCG/ML (10ML) SYRINGE FOR IV PUSH (FOR BLOOD PRESSURE SUPPORT)
PREFILLED_SYRINGE | INTRAVENOUS | Status: DC | PRN
  Administered 2021-05-01: 40 ug via INTRAVENOUS
  Administered 2021-05-01: 80 ug via INTRAVENOUS

## 2021-05-01 MED ORDER — SODIUM CHLORIDE 0.9 % IV SOLN: INTRAVENOUS | Status: DC

## 2021-05-01 MED ORDER — FENTANYL CITRATE (PF) 250 MCG/5ML IJ SOLN
INTRAMUSCULAR | Status: AC
  Filled 2021-05-01: qty 5

## 2021-05-01 MED ORDER — HYDROMORPHONE HCL 1 MG/ML IJ SOLN: 0.2500 mg | INTRAMUSCULAR | Status: DC | PRN

## 2021-05-01 MED ORDER — HYDROCODONE-ACETAMINOPHEN 5-325 MG PO TABS
1.0000 | ORAL_TABLET | ORAL | Status: DC | PRN
  Administered 2021-05-02: 1 via ORAL
  Filled 2021-05-01: qty 1

## 2021-05-01 MED ORDER — 0.9 % SODIUM CHLORIDE (POUR BTL) OPTIME
TOPICAL | Status: DC | PRN
  Administered 2021-05-01: 2000 mL
  Administered 2021-05-01: 1000 mL

## 2021-05-01 MED ORDER — LIDOCAINE 2% (20 MG/ML) 5 ML SYRINGE
INTRAMUSCULAR | Status: DC | PRN
  Administered 2021-05-01: 60 mg via INTRAVENOUS

## 2021-05-01 MED ORDER — ESMOLOL HCL 100 MG/10ML IV SOLN
INTRAVENOUS | Status: DC | PRN
  Administered 2021-05-01 (×5): 20 mg via INTRAVENOUS

## 2021-05-01 MED ORDER — SODIUM CHLORIDE 0.9 % IV SOLN: INTRAVENOUS | Status: DC | PRN

## 2021-05-01 MED ORDER — CEFAZOLIN SODIUM-DEXTROSE 1-4 GM/50ML-% IV SOLN
1.0000 g | Freq: Three times a day (TID) | INTRAVENOUS | Status: AC
  Administered 2021-05-02 (×2): 1 g via INTRAVENOUS
  Filled 2021-05-01 (×2): qty 50

## 2021-05-01 MED ORDER — CHLORHEXIDINE GLUCONATE 0.12 % MT SOLN: 15.0000 mL | Freq: Once | OROMUCOSAL | Status: AC

## 2021-05-01 MED ORDER — LACTATED RINGERS IV SOLN: INTRAVENOUS | Status: DC

## 2021-05-01 MED ORDER — THROMBIN 5000 UNITS EX SOLR
CUTANEOUS | Status: AC
  Filled 2021-05-01: qty 5000

## 2021-05-01 MED ORDER — THROMBIN 20000 UNITS EX SOLR
CUTANEOUS | Status: AC
  Filled 2021-05-01: qty 20000

## 2021-05-01 MED ORDER — OXYCODONE HCL 5 MG PO TABS
5.0000 mg | ORAL_TABLET | Freq: Once | ORAL | Status: DC | PRN
Start: 2021-05-01 — End: 2021-05-01

## 2021-05-01 MED ORDER — AMISULPRIDE (ANTIEMETIC) 5 MG/2ML IV SOLN: 10.0000 mg | Freq: Once | INTRAVENOUS | Status: DC | PRN

## 2021-05-01 MED ORDER — OXYCODONE HCL 5 MG/5ML PO SOLN
5.0000 mg | Freq: Once | ORAL | Status: DC | PRN
Start: 2021-05-01 — End: 2021-05-01

## 2021-05-01 MED ORDER — PROPOFOL 10 MG/ML IV BOLUS
INTRAVENOUS | Status: DC | PRN
  Administered 2021-05-01: 120 mg via INTRAVENOUS
  Administered 2021-05-01: 50 mg via INTRAVENOUS

## 2021-05-01 MED ORDER — BACITRACIN ZINC 500 UNIT/GM EX OINT
TOPICAL_OINTMENT | CUTANEOUS | Status: DC | PRN
  Administered 2021-05-01: 1 via TOPICAL

## 2021-05-01 MED ORDER — CLEVIDIPINE BUTYRATE 0.5 MG/ML IV EMUL
1.0000 mg/h | Freq: Once | INTRAVENOUS | Status: DC
  Filled 2021-05-01: qty 50

## 2021-05-01 MED ORDER — ALBUMIN HUMAN 5 % IV SOLN: INTRAVENOUS | Status: DC | PRN

## 2021-05-01 MED ORDER — ONDANSETRON HCL 4 MG/2ML IJ SOLN: 4.0000 mg | Freq: Once | INTRAMUSCULAR | Status: DC | PRN

## 2021-05-01 MED ORDER — ROCURONIUM BROMIDE 10 MG/ML (PF) SYRINGE
PREFILLED_SYRINGE | INTRAVENOUS | Status: AC
  Filled 2021-05-01: qty 20

## 2021-05-01 MED ORDER — ONDANSETRON HCL 4 MG/2ML IJ SOLN
INTRAMUSCULAR | Status: AC
  Filled 2021-05-01: qty 2

## 2021-05-01 MED ORDER — ONDANSETRON HCL 4 MG PO TABS: 4.0000 mg | ORAL_TABLET | ORAL | Status: DC | PRN

## 2021-05-01 MED ORDER — THROMBIN 20000 UNITS EX SOLR
CUTANEOUS | Status: DC | PRN
  Administered 2021-05-01: 20 mL via TOPICAL

## 2021-05-01 MED ORDER — HEMOSTATIC AGENTS (NO CHARGE) OPTIME
TOPICAL | Status: DC | PRN
  Administered 2021-05-01: 1 via TOPICAL

## 2021-05-01 MED ORDER — PROPOFOL 10 MG/ML IV BOLUS
INTRAVENOUS | Status: AC
  Filled 2021-05-01: qty 40

## 2021-05-01 MED ORDER — PROMETHAZINE HCL 25 MG PO TABS
12.5000 mg | ORAL_TABLET | ORAL | Status: DC | PRN
  Filled 2021-05-01: qty 1

## 2021-05-01 MED ORDER — LIDOCAINE-EPINEPHRINE 1 %-1:100000 IJ SOLN
INTRAMUSCULAR | Status: AC
  Filled 2021-05-01: qty 1

## 2021-05-01 MED ORDER — LIDOCAINE 2% (20 MG/ML) 5 ML SYRINGE
INTRAMUSCULAR | Status: AC
  Filled 2021-05-01: qty 5

## 2021-05-01 MED ORDER — LIDOCAINE-EPINEPHRINE 1 %-1:100000 IJ SOLN
INTRAMUSCULAR | Status: DC | PRN
  Administered 2021-05-01: 5 mL

## 2021-05-01 MED ORDER — ESMOLOL HCL 100 MG/10ML IV SOLN
INTRAVENOUS | Status: AC
  Filled 2021-05-01: qty 10

## 2021-05-01 MED ORDER — PHENYLEPHRINE HCL-NACL 10-0.9 MG/250ML-% IV SOLN
INTRAVENOUS | Status: DC | PRN
  Administered 2021-05-01: 40 ug/min via INTRAVENOUS

## 2021-05-01 MED ORDER — BACITRACIN ZINC 500 UNIT/GM EX OINT
TOPICAL_OINTMENT | CUTANEOUS | Status: AC
  Filled 2021-05-01: qty 28.35

## 2021-05-01 MED ORDER — CEFAZOLIN SODIUM-DEXTROSE 2-4 GM/100ML-% IV SOLN
INTRAVENOUS | Status: AC
  Filled 2021-05-01: qty 100

## 2021-05-01 MED ORDER — ORAL CARE MOUTH RINSE: 15.0000 mL | Freq: Once | OROMUCOSAL | Status: AC

## 2021-05-01 MED ORDER — SUGAMMADEX SODIUM 200 MG/2ML IV SOLN
INTRAVENOUS | Status: DC | PRN
  Administered 2021-05-01: 300 mg via INTRAVENOUS

## 2021-05-01 MED ORDER — THROMBIN 5000 UNITS EX SOLR
OROMUCOSAL | Status: DC | PRN
  Administered 2021-05-01: 5 mL via TOPICAL

## 2021-05-01 MED ORDER — BUPIVACAINE HCL (PF) 0.5 % IJ SOLN
INTRAMUSCULAR | Status: AC
  Filled 2021-05-01: qty 30

## 2021-05-01 MED ORDER — ROCURONIUM BROMIDE 100 MG/10ML IV SOLN
INTRAVENOUS | Status: DC | PRN
  Administered 2021-05-01: 60 mg via INTRAVENOUS
  Administered 2021-05-01 (×4): 20 mg via INTRAVENOUS

## 2021-05-01 MED ORDER — CEFAZOLIN SODIUM-DEXTROSE 2-4 GM/100ML-% IV SOLN
2.0000 g | INTRAVENOUS | Status: AC
  Administered 2021-05-01: 2 g via INTRAVENOUS

## 2021-05-01 MED ORDER — LABETALOL HCL 5 MG/ML IV SOLN
INTRAVENOUS | Status: DC | PRN
  Administered 2021-05-01 (×2): 10 mg via INTRAVENOUS

## 2021-05-01 MED ORDER — PHENYLEPHRINE 40 MCG/ML (10ML) SYRINGE FOR IV PUSH (FOR BLOOD PRESSURE SUPPORT)
PREFILLED_SYRINGE | INTRAVENOUS | Status: AC
  Filled 2021-05-01: qty 10

## 2021-05-01 MED ORDER — ONDANSETRON HCL 4 MG/2ML IJ SOLN
INTRAMUSCULAR | Status: DC | PRN
  Administered 2021-05-01: 4 mg via INTRAVENOUS

## 2021-05-01 MED ORDER — CHLORHEXIDINE GLUCONATE 0.12 % MT SOLN
OROMUCOSAL | Status: AC
  Administered 2021-05-01: 15 mL via OROMUCOSAL
  Filled 2021-05-01: qty 15

## 2021-05-01 MED ORDER — CHLORHEXIDINE GLUCONATE CLOTH 2 % EX PADS
6.0000 | MEDICATED_PAD | Freq: Once | CUTANEOUS | Status: AC
  Administered 2021-05-01: 6 via TOPICAL

## 2021-05-01 MED ORDER — ONDANSETRON HCL 4 MG/2ML IJ SOLN: 4.0000 mg | INTRAMUSCULAR | Status: DC | PRN

## 2021-05-01 MED ORDER — LABETALOL HCL 5 MG/ML IV SOLN: 10.0000 mg | INTRAVENOUS | Status: DC | PRN

## 2021-05-01 MED ORDER — MORPHINE SULFATE (PF) 2 MG/ML IV SOLN: 1.0000 mg | INTRAVENOUS | Status: DC | PRN

## 2021-05-01 SURGICAL SUPPLY — 102 items
APL SKNCLS STERI-STRIP NONHPOA (GAUZE/BANDAGES/DRESSINGS)
BAG COUNTER SPONGE SURGICOUNT (BAG) ×6 IMPLANT
BAG SPNG CNTER NS LX DISP (BAG) ×4
BAND INSRT 18 STRL LF DISP RB (MISCELLANEOUS) ×2
BAND RUBBER #18 3X1/16 STRL (MISCELLANEOUS) ×3 IMPLANT
BENZOIN TINCTURE PRP APPL 2/3 (GAUZE/BANDAGES/DRESSINGS) IMPLANT
BLADE CLIPPER SURG (BLADE) ×3 IMPLANT
BLADE SAW GIGLI 16 STRL (MISCELLANEOUS) IMPLANT
BLADE SURG 15 STRL LF DISP TIS (BLADE) IMPLANT
BLADE SURG 15 STRL SS (BLADE)
BLADE ULTRA TIP 2M (BLADE) ×3 IMPLANT
BNDG CMPR 75X41 PLY HI ABS (GAUZE/BANDAGES/DRESSINGS)
BNDG GAUZE ELAST 4 BULKY (GAUZE/BANDAGES/DRESSINGS) IMPLANT
BNDG STRETCH 4X75 STRL LF (GAUZE/BANDAGES/DRESSINGS) IMPLANT
BUR ACORN 6.0 PRECISION (BURR) ×3 IMPLANT
BUR ROUND FLUTED 4 SOFT TCH (BURR) IMPLANT
BUR SPIRAL ROUTER 2.3 (BUR) ×3 IMPLANT
CANISTER SUCT 3000ML PPV (MISCELLANEOUS) ×9 IMPLANT
CARTRIDGE OIL MAESTRO DRILL (MISCELLANEOUS) ×2 IMPLANT
CATH VENTRIC 35X38 W/TROCAR LG (CATHETERS) IMPLANT
CLIP VESOCCLUDE MED 6/CT (CLIP) IMPLANT
CNTNR URN SCR LID CUP LEK RST (MISCELLANEOUS) ×2 IMPLANT
CONT SPEC 4OZ STRL OR WHT (MISCELLANEOUS) ×3
COVER BURR HOLE 7 (Orthopedic Implant) ×9 IMPLANT
COVER MAYO STAND STRL (DRAPES) IMPLANT
DECANTER SPIKE VIAL GLASS SM (MISCELLANEOUS) IMPLANT
DIFFUSER DRILL AIR PNEUMATIC (MISCELLANEOUS) ×3 IMPLANT
DRAIN SUBARACHNOID (WOUND CARE) IMPLANT
DRAPE HALF SHEET 40X57 (DRAPES) ×3 IMPLANT
DRAPE MICROSCOPE LEICA (MISCELLANEOUS) ×3 IMPLANT
DRAPE NEUROLOGICAL W/INCISE (DRAPES) ×3 IMPLANT
DRAPE STERI IOBAN 125X83 (DRAPES) ×3 IMPLANT
DRAPE SURG 17X23 STRL (DRAPES) IMPLANT
DRAPE WARM FLUID 44X44 (DRAPES) ×3 IMPLANT
DRSG ADAPTIC 3X8 NADH LF (GAUZE/BANDAGES/DRESSINGS) IMPLANT
DRSG TELFA 3X8 NADH (GAUZE/BANDAGES/DRESSINGS) ×3 IMPLANT
DURAPREP 6ML APPLICATOR 50/CS (WOUND CARE) ×3 IMPLANT
ELECT REM PT RETURN 9FT ADLT (ELECTROSURGICAL) ×3
ELECTRODE REM PT RTRN 9FT ADLT (ELECTROSURGICAL) ×2 IMPLANT
EVACUATOR 1/8 PVC DRAIN (DRAIN) IMPLANT
EVACUATOR SILICONE 100CC (DRAIN) IMPLANT
FORCEPS BIPOLAR SPETZLER 8 1.0 (NEUROSURGERY SUPPLIES) ×3 IMPLANT
GAUZE 4X4 16PLY ~~LOC~~+RFID DBL (SPONGE) ×6 IMPLANT
GAUZE SPONGE 4X4 12PLY STRL (GAUZE/BANDAGES/DRESSINGS) IMPLANT
GLOVE EXAM NITRILE XL STR (GLOVE) IMPLANT
GLOVE SURG ENC MOIS LTX SZ7.5 (GLOVE) IMPLANT
GLOVE SURG LTX SZ7 (GLOVE) ×6 IMPLANT
GLOVE SURG POLYISO LF SZ6.5 (GLOVE) ×3 IMPLANT
GLOVE SURG UNDER POLY LF SZ7 (GLOVE) ×3 IMPLANT
GLOVE SURG UNDER POLY LF SZ7.5 (GLOVE) ×12 IMPLANT
GOWN STRL REUS W/ TWL LRG LVL3 (GOWN DISPOSABLE) ×4 IMPLANT
GOWN STRL REUS W/ TWL XL LVL3 (GOWN DISPOSABLE) ×2 IMPLANT
GOWN STRL REUS W/TWL 2XL LVL3 (GOWN DISPOSABLE) ×3 IMPLANT
GOWN STRL REUS W/TWL LRG LVL3 (GOWN DISPOSABLE) ×6
GOWN STRL REUS W/TWL XL LVL3 (GOWN DISPOSABLE) ×3
HEMOSTAT POWDER KIT SURGIFOAM (HEMOSTASIS) ×3 IMPLANT
HEMOSTAT SURGICEL 2X14 (HEMOSTASIS) IMPLANT
HOOK DURA 1/2IN (MISCELLANEOUS) ×3 IMPLANT
IV NS 1000ML (IV SOLUTION) ×3
IV NS 1000ML BAXH (IV SOLUTION) ×2 IMPLANT
KIT BASIN OR (CUSTOM PROCEDURE TRAY) ×3 IMPLANT
KIT DRAIN CSF ACCUDRAIN (MISCELLANEOUS) IMPLANT
KIT TURNOVER KIT B (KITS) ×3 IMPLANT
KNIFE ARACHNOID DISP AM-24-S (MISCELLANEOUS) IMPLANT
MARKER SPHERE PSV REFLC 13MM (MARKER) ×6 IMPLANT
NEEDLE HYPO 22GX1.5 SAFETY (NEEDLE) ×3 IMPLANT
NEEDLE SPNL 18GX3.5 QUINCKE PK (NEEDLE) IMPLANT
NS IRRIG 1000ML POUR BTL (IV SOLUTION) ×9 IMPLANT
OIL CARTRIDGE MAESTRO DRILL (MISCELLANEOUS) ×3
PACK BATTERY CMF DISP FOR DVR (ORTHOPEDIC DISPOSABLE SUPPLIES) ×3 IMPLANT
PACK CRANIOTOMY CUSTOM (CUSTOM PROCEDURE TRAY) ×3 IMPLANT
PATTIES SURGICAL .25X.25 (GAUZE/BANDAGES/DRESSINGS) IMPLANT
PATTIES SURGICAL .5 X.5 (GAUZE/BANDAGES/DRESSINGS) IMPLANT
PATTIES SURGICAL .5 X3 (DISPOSABLE) IMPLANT
PATTIES SURGICAL 1/4 X 3 (GAUZE/BANDAGES/DRESSINGS) IMPLANT
PATTIES SURGICAL 1X1 (DISPOSABLE) IMPLANT
PIN MAYFIELD SKULL DISP (PIN) ×3 IMPLANT
SCREW UNIII AXS SD 1.5X4 (Screw) ×33 IMPLANT
SET CARTRIDGE AND TUBING (SET/KITS/TRAYS/PACK) ×3 IMPLANT
SPECIMEN JAR SMALL (MISCELLANEOUS) IMPLANT
SPONGE NEURO XRAY DETECT 1X3 (DISPOSABLE) IMPLANT
SPONGE SURGIFOAM ABS GEL 100 (HEMOSTASIS) ×3 IMPLANT
STAPLER VISISTAT 35W (STAPLE) ×3 IMPLANT
STOCKINETTE 6  STRL (DRAPES)
STOCKINETTE 6 STRL (DRAPES) IMPLANT
SUT ETHILON 3 0 FSL (SUTURE) IMPLANT
SUT ETHILON 3 0 PS 1 (SUTURE) IMPLANT
SUT NURALON 4 0 TR CR/8 (SUTURE) ×6 IMPLANT
SUT SILK 0 TIES 10X30 (SUTURE) IMPLANT
SUT VIC AB 0 CT1 18XCR BRD8 (SUTURE) ×4 IMPLANT
SUT VIC AB 0 CT1 8-18 (SUTURE) ×6
SUT VIC AB 3-0 SH 8-18 (SUTURE) ×6 IMPLANT
TAPE CLOTH 1X10 TAN NS (GAUZE/BANDAGES/DRESSINGS) IMPLANT
TIP STANDARD 36KHZ (INSTRUMENTS) ×3
TIP STD 36KHZ (INSTRUMENTS) ×2 IMPLANT
TOWEL GREEN STERILE (TOWEL DISPOSABLE) ×3 IMPLANT
TOWEL GREEN STERILE FF (TOWEL DISPOSABLE) ×3 IMPLANT
TRAY FOLEY MTR SLVR 16FR STAT (SET/KITS/TRAYS/PACK) IMPLANT
TUBE CONNECTING 12X1/4 (SUCTIONS) IMPLANT
UNDERPAD 30X36 HEAVY ABSORB (UNDERPADS AND DIAPERS) ×6 IMPLANT
WATER STERILE IRR 1000ML POUR (IV SOLUTION) ×3 IMPLANT
WRENCH TORQUE 36KHZ (INSTRUMENTS) ×3 IMPLANT

## 2021-05-01 NOTE — Progress Notes (Signed)
OT Cancellation Note  Patient Details Name: KALIS BOGDANOWICZ MRN: [id number] DOB: 09-Jan-1941   Cancelled Treatment:    Reason Eval/Treat Not Completed: Patient at procedure or test/ unavailable. Left frontal craniotomy for resection of tumor today. Plan to reattempt after surgery once medically appropriate.   Tyrone Schimke, OT Acute Rehabilitation Services Pager: [phone] Office: [phone]  05/01/2021, 11:55 AM

## 2021-05-01 NOTE — Anesthesia Preprocedure Evaluation (Addendum)
Anesthesia Evaluation  Patient identified by MRN, date of birth, ID band Patient awake    Reviewed: Allergy & Precautions, NPO status , Patient's Chart, lab work & pertinent test results, reviewed documented beta blocker date and time   Airway Mallampati: II  TM Distance: >3 FB Neck ROM: Full    Dental no notable dental hx. (+) Teeth Intact, Dental Advisory Given   Pulmonary neg pulmonary ROS,    Pulmonary exam normal breath sounds clear to auscultation       Cardiovascular hypertension, Pt. on medications and Pt. on home beta blockers +CHF (grade 2 diastolic dysfunction)  Normal cardiovascular exam+ dysrhythmias (previously on eliquis, has been off for multiple weeks) Atrial Fibrillation + Valvular Problems/Murmurs (mod MR) MR  Rhythm:Regular Rate:Normal  Echo 7/21: 1. Left ventricular ejection fraction, by estimation, is 55 to 60%. The  left ventricle has normal function. The left ventricle has no regional  wall motion abnormalities. Left ventricular diastolic parameters are  consistent with Grade II diastolic  dysfunction (pseudonormalization). Elevated left atrial pressure. The  average left ventricular global longitudinal strain is -15.8 %. The global  longitudinal strain is normal.  2. Right ventricular systolic function is normal. The right ventricular  size is normal. There is normal pulmonary artery systolic pressure.  3. The mitral valve is normal in structure. Moderate mitral valve  regurgitation. No evidence of mitral stenosis.  4. The aortic valve is normal in structure. Aortic valve regurgitation is  not visualized. No aortic stenosis is present.  5. The inferior vena cava is normal in size with greater than 50%  respiratory variability, suggesting right atrial pressure of 3 mmHg.    Neuro/Psych Seizures -,  MRI 04/2021: 1. Significant interval increase in the size of a hemorrhagic superior left frontal lobe  intraparenchymal metastasis that measures 3.1 x 2.6 x 3.0 cm (previously 1.5 x 1.4 x 1.8 cm when remeasured). Increased surrounding vasogenic edema with local mass effect. No midline shift. 2. No new lesions identified. CVA (2015, residul hemiparesis), Residual Symptoms negative psych ROS   GI/Hepatic negative GI ROS, Neg liver ROS,   Endo/Other  negative endocrine ROS  Renal/GU negative Renal ROS  negative genitourinary   Musculoskeletal negative musculoskeletal ROS (+)   Abdominal   Peds  Hematology negative hematology ROS (+)   Anesthesia Other Findings Metastatic anal ca  Reproductive/Obstetrics negative OB ROS                            Anesthesia Physical Anesthesia Plan  ASA: 3  Anesthesia Plan: General   Post-op Pain Management:    Induction: Intravenous  PONV Risk Score and Plan: 3 and Ondansetron, Dexamethasone and Treatment may vary due to age or medical condition  Airway Management Planned: Oral ETT  Additional Equipment: Arterial line  Intra-op Plan:   Post-operative Plan: Extubation in OR  Informed Consent: I have reviewed the patients History and Physical, chart, labs and discussed the procedure including the risks, benefits and alternatives for the proposed anesthesia with the patient or authorized representative who has indicated his/her understanding and acceptance.   Patient has DNR.  Discussed DNR with patient and Suspend DNR.   Dental advisory given  Plan Discussed with: CRNA  Anesthesia Plan Comments: (Difficult IV, will need 2nd PIV after induction Arterial line in preop D/w pt blood transfusion, possible postoperative ventilation  Suspend DNR intraoperatively )      Anesthesia Quick Evaluation

## 2021-05-01 NOTE — Progress Notes (Signed)
  NEUROSURGERY PROGRESS NOTE   No issues overnight. No complaints this am.  EXAM:  BP 128/73 (BP Location: Right Arm)   Pulse 67   Temp 97.8 F (36.6 C) (Oral)   Resp 20   Ht _0  (1.651 m)   Wt 67.3 kg   SpO2 100%   BMI 24.69 kg/m   Awake, alert, oriented  Speech fluent, appropriate  CN grossly intact  5/5 LUE/LLE 0/5 RUE/RLE  IMPRESSION:  80 y.o. female with enlargement/hemorrhage of previously radiated left frontal met and associated right hemiplegia  PLAN: - Proceed with surgical resection today  I have reviewed the details of surgery and associated risks, benefits, and alternatives with the patient and her family. All their questions today were answered and she provided consent to proceed.   Consuella Lose, MD [facility]

## 2021-05-01 NOTE — Op Note (Signed)
NEUROSURGERY OPERATIVE NOTE   PREOP DIAGNOSIS:  Left frontal metastatic brain tumor   POSTOP DIAGNOSIS: Same  PROCEDURE: Stereotactic left frontal craniotomy for resection of tumor Use of intraoperative microscope for microdissection  SURGEON: Dr. Consuella Lose, MD  ASSISTANT: None  ANESTHESIA: General Endotracheal  EBL: 150cc  SPECIMENS:  Frontal Hematoma Frontal Tumor  DRAINS: None  COMPLICATIONS: None immediate  CONDITION: Hemodynamically stable to PACU  HISTORY: Angela Carpenter is a 80 y.o. female with a history of metastatic melanoma who underwent stereotactic radiosurgery treatment of a left frontal metastasis several months ago.  Over the last several months she has had very mild right-sided weakness which rapidly progressed over the last several days to essentially right hemiplegia.  Follow-up imaging revealed hemorrhagic transformation of the previously treated metastasis with likely enlargement of the lesion and significant surrounding vasogenic edema.  Her case was discussed with her oncologist, radiation oncology, and neuro-oncology and consensus opinion was to proceed with surgical resection of the lesion.  The treatment options were discussed in detail with the patient and her family including the recommendation for surgical resection.  The risks, benefits, and alternatives to surgery as well as the expected postoperative course and recovery were all reviewed in detail.  After all her questions were answered informed consent was obtained and witnessed.  PROCEDURE IN DETAIL: The patient was brought to the operating room. After induction of general anesthesia, the patient was positioned on the operative table in the [place] head holder in the supine position. All pressure points were meticulously padded.  Preoperative CT scan was then coregistered with surface markers until a satisfactory accuracy was achieved.  Stereotactic system was then used to mark out the  surface projection of the superior sagittal sinus and the left frontal tumor.  A sigmoid shaped paramedian skin incision was then planned out to allow access to the tumor.  Skin incision was then marked out and prepped and draped in the usual sterile fashion.  After timeout was conducted, the incision was infiltrated with local anesthetic with epinephrine.  Incision was then made sharply and carried down through the galea.  Raney clips were applied.  The periosteum was elevated and self-retaining retractors were placed.  Stereotactic system was again used to identify the superior sagittal sinus.  Multiple bur holes were then planned out to allow access to the tumor just lateral to the superior sagittal sinus.  Bur holes were then created and connected with the craniotome.  A single piece frontal craniotomy flap was elevated.  Hemostasis was secured on the epidural plane with a combination of bipolar electrocautery and morselized Gelfoam with thrombin.  Dura was then opened in semilunar fashion based medially.  Dura was tacked up with 4 Nurolon stitches.  I was then easily able to identify the underlying tumor with a very dark almost black appearance visible through the sulcus of the frontal lobe.  A small amount of the pia was coagulated with the bipolar around the identified tumor.  Microscope was then brought into the field and the remainder of the tumor resection was done under the microscope using microdissection.  Using a combination of the ultrasonic aspirator set at very low settings and the bipolar electrocautery, initially the hematoma was delineated and removed and sent for specimen.  The surrounding tissue appeared to be gliotic, certainly more firm than normal white matter.  This was also removed with the ultrasonic aspirator and sent as a separate specimen.  Once this was completed, the resection cavity was  inspected and I did not identify any obvious remaining tumor.  The tumor bed was covered  with morselized Gelfoam with thrombin and cottonoids.  These were removed and the wound was irrigated with significant amount of normal saline irrigation.  No active bleeding was identified.  The dura was then reapproximated with interrupted 4 Nurolon stitches.  A layer of Gelfoam was placed over the dural surface after hemostasis was secured with morselized Gelfoam with thrombin.  Bone flap was then replaced and plated with standard titanium plates and screws.  Briant Cedar was reapproximated with interrupted 0 and 3-0 Vicryl stitches.  Skin was closed with staples.  At the end of the case all sponge, needle, instrument, and cottonoid counts were correct.  Bacitracin ointment and sterile dressing was applied.  The patient was then removed from the [place] head holder and extubated.  She was then taken to the postanesthesia care unit in stable hemodynamic condition.   Angela Lose, MD [facility]

## 2021-05-01 NOTE — Anesthesia Procedure Notes (Signed)
Arterial Line Insertion Start/End7/26/2022 1:45 PM, 05/01/2021 1:55 PM Performed by: Pervis Hocking, DO, anesthesiologist  Patient location: Pre-op. Preanesthetic checklist: patient identified, IV checked, site marked, risks and benefits discussed, surgical consent, monitors and equipment checked, pre-op evaluation, timeout performed and anesthesia consent Lidocaine 1% used for infiltration Left, radial was placed Catheter size: 20 Fr Hand hygiene performed  and maximum sterile barriers used   Attempts: 1 Procedure performed without using ultrasound guided technique. Following insertion, dressing applied. Post procedure assessment: normal and unchanged  Post procedure complications: unsuccessful attempts, second provider assisted and local hematoma. Patient tolerated the procedure well with no immediate complications. Additional procedure comments: 2 attempts right radial by srna.

## 2021-05-01 NOTE — Anesthesia Procedure Notes (Signed)
Procedure Name: Intubation Date/Time: 05/01/2021 5:09 PM Performed by: Reece Agar, CRNA Pre-anesthesia Checklist: Patient identified, Emergency Drugs available, Suction available and Patient being monitored Patient Re-evaluated:Patient Re-evaluated prior to induction Oxygen Delivery Method: Circle System Utilized Preoxygenation: Pre-oxygenation with 100% oxygen Induction Type: IV induction Ventilation: Mask ventilation without difficulty Laryngoscope Size: Mac and 3 Grade View: Grade II Tube type: Oral Tube size: 7.0 mm Number of attempts: 1 Airway Equipment and Method: Stylet and Oral airway Placement Confirmation: ETT inserted through vocal cords under direct vision, positive ETCO2 and breath sounds checked- equal and bilateral Secured at: 22 cm Tube secured with: Tape Dental Injury: Teeth and Oropharynx as per pre-operative assessment

## 2021-05-01 NOTE — Transfer of Care (Signed)
Immediate Anesthesia Transfer of Care Note  Patient: Angela Carpenter  Procedure(s) Performed: STEREOTACTIC FRONTAL CRANIOTOMY TUMOR EXCISION (Left: Head) APPLICATION OF CRANIAL NAVIGATION (Left)  Patient Location: PACU  Anesthesia Type:General  Level of Consciousness: awake, alert  and oriented  Airway & Oxygen Therapy: Patient Spontanous Breathing and Patient connected to face mask oxygen  Post-op Assessment: Report given to RN and Post -op Vital signs reviewed and stable  Post vital signs: Reviewed and stable  Last Vitals:  Vitals Value Taken Time  BP 157/71 (ABP) 05/01/21 1941  Temp    Pulse 76 05/01/21 1945  Resp 15 05/01/21 1945  SpO2 100 % 05/01/21 1945  Vitals shown include unvalidated device data.  Nundkumar MD SBP goal < 160 mmHG  Last Pain:  Vitals:   05/01/21 1257  TempSrc:   PainSc: 0-No pain      Patients Stated Pain Goal: 0 ([id number])  Complications: No notable events documented.

## 2021-05-01 NOTE — Progress Notes (Signed)
PROGRESS NOTE    Angela Carpenter  IRC:[id number] DOB: [birth year] DOA: 04/23/2021 PCP: Haywood Pao, MD   Brief Narrative:  80 yo Angela with hx metastatic melanoma to the brain w/ known hemorrhagic met L frontal lobe s/p SRS, breast cancer, a fib not on anticoagulation or ASA 2/2 hemorrhagic met admitted with worsening R sided weakness in setting of marked expansion of LF hemorrhagic met.  Angela was transferred under [place] on 04/25/2021.  Angela waiting for her surgery scheduled on 05/01/2021.  Angela had 2 to 3 minutes of seizure like activity on the night of 04/29/2021.  Assessment & Plan:   Active Problems:   ICH (intracerebral hemorrhage) (HCC)   Metastasis to brain ([place])   Hemiparesis ([place])  Intracranial hemorrhagic metastasis, primary mucosal melanoma/fall: Presented with right-sided weakness.  Still has flaccid paralysis in right upper and lower extremity.  Initially admitted under neurology who had consulted neurooncology as well as neurosurgery and transferred under hospitalist service. Continue dexamethasone and Keppra.  Neurosurgery saw this Angela.  They offered surgery.  Angela is planned to have resection today.  Seizure-like activity: Angela was noted to have seizure-like activity on the night of 04/29/2021 which lasted for 2 to 3 minutes.  Angela did not require any medications.  According to Angela, she was conscious the whole time.  Repeat CT scan was done which showed unchanged size of intraparenchymal hematoma in superior left hemisphere with surrounding edema. EEG showed no epileptiform activities but cortical dysfunction in left temporal region likely secondary to underlying hemorrhage.  Keppra dose was increased to 750 mg p.o. twice daily.  Hyperlipidemia: Continue atorvastatin.  Hypertension: Controlled.  Continue Toprol-XL.  DVT prophylaxis: SCD's Start: 05/01/21 0700 SCD's Start: 04/23/21 1323   Code Status: DNR  Family Communication: Son and brother  both present at bedside.  Plan of care discussed with all of them.  They were already aware of all the plan.  Status is: Inpatient  Remains inpatient appropriate because: Pending surgical procedure scheduled on 05/03/2021.  Dispo: The Angela is from: Home              Anticipated d/c is to: Home              Angela currently is not medically stable to d/c.   Difficult to place Angela No        Estimated body mass index is 24.69 kg/m as calculated from the following:   Height as of this encounter: '5\' 5"'  (1.651 m).   Weight as of this encounter: 67.3 kg.      Nutritional status:               Consultants:  Neurology Neuro-oncology Oncology Neurosurgery  Procedures:  None  Antimicrobials:  Anti-infectives (From admission, onward)    Start     Dose/Rate Route Frequency Ordered Stop   05/03/21 0600  ceFAZolin (ANCEF) IVPB 2g/100 mL premix        2 g 200 mL/hr over 30 Minutes Intravenous On call to O.R. 05/01/21 5102 05/04/21 0559   04/24/21 1330  acyclovir (ZOVIRAX) tablet 400 mg        400 mg Oral 2 times daily 04/24/21 1233            Subjective: Seen and examined.  No new complaints.  She was able to wiggle her thumb on the right hand and almost all the toes in the right foot, this is new development and improvement.  Objective: Vitals:   04/30/21 2037  05/01/21 0018 05/01/21 0607 05/01/21 0826  BP: 131/70 127/74 121/72 128/73  Pulse: 77 70 67 67  Resp: '18 20 20 20  ' Temp: 98.8 F (37.1 C) 97.8 F (36.6 C) 97.8 F (36.6 C) 97.8 F (36.6 C)  TempSrc: Oral Oral Oral Oral  SpO2: 96% 99% 98% 100%  Weight:      Height:        Intake/Output Summary (Last 24 hours) at 05/01/2021 1148 Last data filed at 05/01/2021 3662 Gross per 24 hour  Intake 240 ml  Output 1950 ml  Net -1710 ml    Filed Weights   04/23/21 1038 04/24/21 0604  Weight: 68 kg 67.3 kg    Examination: General exam: Appears calm and comfortable  Respiratory system: Clear  to auscultation. Respiratory effort normal. Cardiovascular system: S1 & S2 heard, RRR. No JVD, murmurs, rubs, gallops or clicks. No pedal edema. Gastrointestinal system: Abdomen is nondistended, soft and nontender. No organomegaly or masses felt. Normal bowel sounds heard. Central nervous system: Alert and oriented.  Right-sided flaccid paralysis. Extremities: Symmetric 5 x 5 power. Skin: No rashes, lesions or ulcers.  Psychiatry: Judgement and insight appear normal. Mood & affect appropriate.   Data Reviewed: I have personally reviewed following labs and imaging studies  CBC: Recent Labs  Lab 04/26/21 0752 04/30/21 0849  WBC 19.4* 17.5*  NEUTROABS 16.9* 14.2*  HGB 14.8 15.1*  HCT 44.7 44.5  MCV 95.3 94.7  PLT 266 947    Basic Metabolic Panel: Recent Labs  Lab 04/26/21 0752 04/30/21 0849  NA 136 131*  K 3.9 3.9  CL 101 100  CO2 25 23  GLUCOSE 115* 132*  BUN 25* 20  CREATININE 0.63 0.65  CALCIUM 9.1 8.6*  MG 2.3 2.2    GFR: Estimated Creatinine Clearance: 53 mL/min (by C-G formula based on SCr of 0.65 mg/dL). Liver Function Tests: No results for input(s): AST, ALT, ALKPHOS, BILITOT, PROT, ALBUMIN in the last 168 hours.  No results for input(s): LIPASE, AMYLASE in the last 168 hours. No results for input(s): AMMONIA in the last 168 hours. Coagulation Profile: No results for input(s): INR, PROTIME in the last 168 hours.  Cardiac Enzymes: No results for input(s): CKTOTAL, CKMB, CKMBINDEX, TROPONINI in the last 168 hours. BNP (last 3 results) No results for input(s): PROBNP in the last 8760 hours. HbA1C: No results for input(s): HGBA1C in the last 72 hours. CBG: No results for input(s): GLUCAP in the last 168 hours.  Lipid Profile: No results for input(s): CHOL, HDL, LDLCALC, TRIG, CHOLHDL, LDLDIRECT in the last 72 hours. Thyroid Function Tests: No results for input(s): TSH, T4TOTAL, FREET4, T3FREE, THYROIDAB in the last 72 hours. Anemia Panel: No results for  input(s): VITAMINB12, FOLATE, FERRITIN, TIBC, IRON, RETICCTPCT in the last 72 hours. Sepsis Labs: No results for input(s): PROCALCITON, LATICACIDVEN in the last 168 hours.  Recent Results (from the past 240 hour(s))  Resp Panel by RT-PCR (Flu A&B, Covid) Nasopharyngeal Swab     Status: None   Collection Time: 04/23/21  1:28 PM   Specimen: Nasopharyngeal Swab; Nasopharyngeal(NP) swabs in vial transport medium  Result Value Ref Range Status   SARS Coronavirus 2 by RT PCR NEGATIVE NEGATIVE Final    Comment: (NOTE) SARS-CoV-2 target nucleic acids are NOT DETECTED.  The SARS-CoV-2 RNA is generally detectable in upper respiratory specimens during the acute phase of infection. The lowest concentration of SARS-CoV-2 viral copies this assay can detect is 138 copies/mL. A negative result does not preclude SARS-Cov-2 infection  and should not be used as the sole basis for treatment or other Angela management decisions. A negative result may occur with  improper specimen collection/handling, submission of specimen other than nasopharyngeal swab, presence of viral mutation(s) within the areas targeted by this assay, and inadequate number of viral copies(<138 copies/mL). A negative result must be combined with clinical observations, Angela history, and epidemiological information. The expected result is Negative.  Fact Sheet for Patients:  EntrepreneurPulse.com.au  Fact Sheet for Healthcare Providers:  IncredibleEmployment.be  This test is no t yet approved or cleared by the [place] FDA and  has been authorized for detection and/or diagnosis of SARS-CoV-2 by FDA under an Emergency Use Authorization (EUA). This EUA will remain  in effect (meaning this test can be used) for the duration of the COVID-19 declaration under Section 564(b)(1) of the Act, 21 U.S.C.section 360bbb-3(b)(1), unless the authorization is terminated  or revoked sooner.        Influenza A by PCR NEGATIVE NEGATIVE Final   Influenza B by PCR NEGATIVE NEGATIVE Final    Comment: (NOTE) The Xpert Xpress SARS-CoV-2/FLU/RSV plus assay is intended as an aid in the diagnosis of influenza from Nasopharyngeal swab specimens and should not be used as a sole basis for treatment. Nasal washings and aspirates are unacceptable for Xpert Xpress SARS-CoV-2/FLU/RSV testing.  Fact Sheet for Patients: EntrepreneurPulse.com.au  Fact Sheet for Healthcare Providers: IncredibleEmployment.be  This test is not yet approved or cleared by the [place] FDA and has been authorized for detection and/or diagnosis of SARS-CoV-2 by FDA under an Emergency Use Authorization (EUA). This EUA will remain in effect (meaning this test can be used) for the duration of the COVID-19 declaration under Section 564(b)(1) of the Act, 21 U.S.C. section 360bbb-3(b)(1), unless the authorization is terminated or revoked.  Performed at [facility], [place] [address], [place], [place] [address]   MRSA Next Gen by PCR, Nasal     Status: None   Collection Time: 04/24/21  6:06 AM   Specimen: Nasal Mucosa; Nasal Swab  Result Value Ref Range Status   MRSA by PCR Next Gen NOT DETECTED NOT DETECTED Final    Comment: (NOTE) The GeneXpert MRSA Assay (FDA approved for NASAL specimens only), is one component of a comprehensive MRSA colonization surveillance program. It is not intended to diagnose MRSA infection nor to guide or monitor treatment for MRSA infections. Test performance is not FDA approved in patients less than 16 years old. Performed at [facility], [place] [address] [place], [place] [address]   Urine Culture     Status: None   Collection Time: 04/24/21  8:53 AM   Specimen: Urine, Clean Catch  Result Value Ref Range Status   Specimen Description URINE, CLEAN CATCH  Final   Special Requests NONE  Final   Culture   Final    NO GROWTH Performed at  [facility], [place] [address], [place], [place] [address]    Report Status 04/25/2021 FINAL  Final       Radiology Studies: CT HEAD WO CONTRAST  Result Date: 04/30/2021 CLINICAL DATA:  Seizure.  Hemorrhage follow-up EXAM: CT HEAD WITHOUT CONTRAST TECHNIQUE: Contiguous axial images were obtained from the base of the skull through the vertex without intravenous contrast. COMPARISON:  04/24/2021 FINDINGS: Brain: Unchanged size of intraparenchymal hematoma in the superior left hemisphere with surrounding edema. No new site of hemorrhage. No midline shift or herniation. Vascular: No hyperdense vessel or unexpected calcification. Skull: Normal.  Negative for fracture or focal lesion. Sinuses/Orbits: No acute finding. Other: None. IMPRESSION: Unchanged size of intraparenchymal hematoma in the superior left hemisphere with surrounding edema. Electronically Signed   By: Ulyses Jarred M.D.   On: 04/30/2021 00:16   EEG adult  Result Date: 04/30/2021 Lora Havens, MD     04/30/2021 10:49 AM Angela Carpenter: MARYKATE HEUBERGER MRN: [id number] Epilepsy Attending: Lora Havens Referring Physician/Provider: Dr Derrick Ravel Date: 04/30/2021 Duration: 29.21 mins Angela history: 80 year old female with seizure-like activity.  EEG to evaluate for seizures. Level of alertness: Awake, asleep AEDs during EEG study: LEV Technical aspects: This EEG study was done with scalp electrodes positioned according to the 10-20 International system of electrode placement. Electrical activity was acquired at a sampling rate of '500Hz'  and reviewed with a high frequency filter of '70Hz'  and a low frequency filter of '1Hz' . EEG data were recorded continuously and digitally stored. Description: The posterior dominant rhythm consists of 9 Hz activity of moderate voltage (25-35 uV) seen predominantly in posterior head regions, asymmetric ( left<right) and reactive to eye opening and eye closing. Sleep was characterized by vertex waves,  sleep spindles (12 to 14 Hz), maximal frontocentral region.  EEG also showed intermittent left temporal 3 to 5 Hz theta-delta slowing.  Hyperventilation and photic stimulation were not performed.   ABNORMALITY - Intermittent slow, left temporal region - Background asymmetry, left<right IMPRESSION: This study is suggestive of cortical dysfunction in left temporal region likely secondary to underlying hemorrhage. No seizures or epileptiform discharges were seen throughout the recording. Priyanka Barbra Sarks    Scheduled Meds:   stroke: mapping our early stages of recovery book   Does not apply Once   acyclovir  400 mg Oral BID   atorvastatin  10 mg Oral Daily   Chlorhexidine Gluconate Cloth  6 each Topical Daily   dexamethasone  8 mg Oral Q12H   levETIRAcetam  750 mg Oral BID   metoprolol succinate  25 mg Oral BID   pantoprazole  40 mg Oral QHS   raloxifene  60 mg Oral Daily   senna-docusate  1 tablet Oral BID   Continuous Infusions:  [START ON 05/03/2021]  ceFAZolin (ANCEF) IV       LOS: 8 days   Time spent: 28 minutes   Darliss Cheney, MD [facility]  05/01/2021, 11:48 AM   How to contact the [facility] Attending or Consulting provider [place] or covering provider during after hours [place], for this Angela?  Check the care team in [facility] and look for a) attending/consulting [facility] provider listed and b) the [facility] team listed. Page or secure chat [phone]. Log into [email or web address] and use [place]'s universal password to access. If you do not have the password, please contact the hospital operator. Locate the [facility] provider you are looking for under [facility] and page to a number that you can be directly reached. If you still have difficulty reaching the provider, please page the [facility] (Director on Call) for the Hospitalists listed on amion for assistance.

## 2021-05-02 ENCOUNTER — Inpatient Hospital Stay (HOSPITAL_COMMUNITY): Payer: Medicare Other

## 2021-05-02 DIAGNOSIS — I10 Essential (primary) hypertension: Secondary | ICD-10-CM | POA: Diagnosis not present

## 2021-05-02 IMAGING — MR MR HEAD WO/W CM
14 of 20 series · 33 of 48 positions shown · IV contrast (gadavist)
Comparison: Prior MRI from [DATE].

CLINICAL DATA: Follow-up examination status post stereotactic left
frontal craniotomy for tumor resection.

EXAM:
MRI HEAD WITHOUT AND WITH CONTRAST
TECHNIQUE: Multiplanar, multiecho pulse sequences of the brain and surrounding
structures were obtained without and with intravenous contrast.
CONTRAST:  6.5mL GADAVIST GADOBUTROL 1 MMOL/ML IV SOLN

[Series 5: DWI · axial · 3.0mm · 0.88mm/px · z∈[-113,+42]mm · 5 of 106 slices shown (1 of 4)]
[im 1/106]
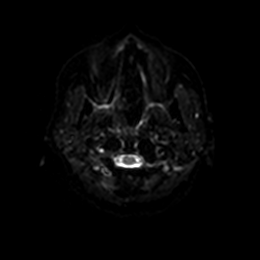
[im 27/106]
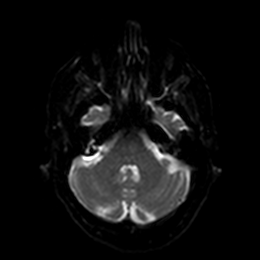
[im 53/106]
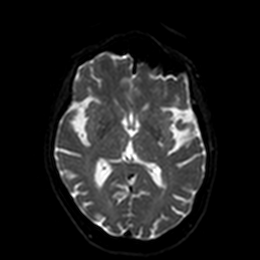
[im 79/106]
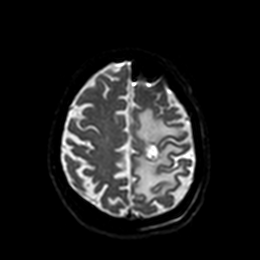
[im 106/106]
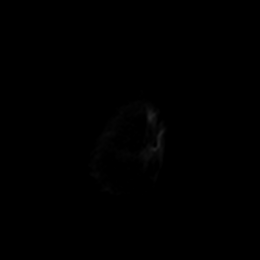

[Series 6: DWI · axial · 3.0mm · 0.88mm/px · z∈[-113,+42]mm · 2 of 53 slices shown (2 of 4)]
[im 1/53]
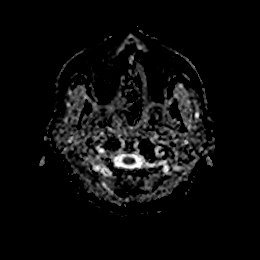
[im 53/53]
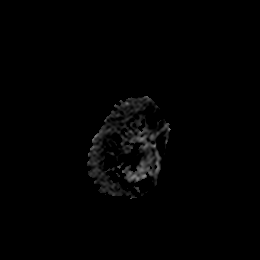

[Series 7: DWI · coronal · 4.0mm · 0.88mm/px · 4 of 72 slices shown (3 of 4)]
[im 1/72]
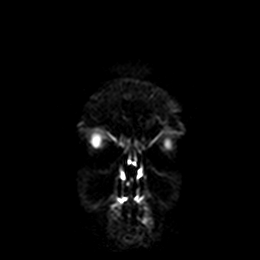
[im 24/72]
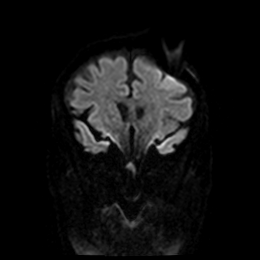
[im 48/72]
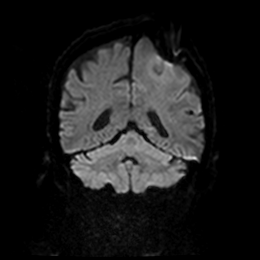
[im 72/72]
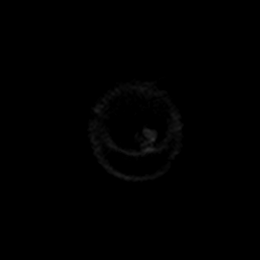

[Series 8: DWI · coronal · 4.0mm · 0.88mm/px · 2 of 36 slices shown (4 of 4)]
[im 1/36]
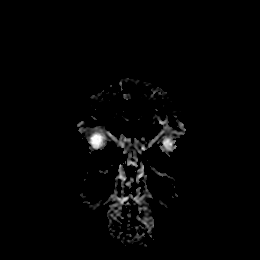
[im 36/36]
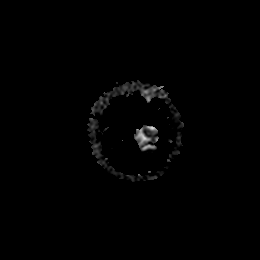

[Series 9: T1 · sagittal · 5.0mm · 0.75mm/px · 1 of 25 slices shown]
[im 1/25]
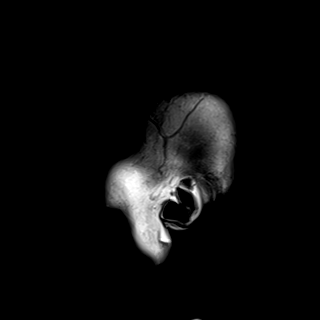

[Series 10: T2 · axial · 5.0mm · 0.72mm/px · 1 of 28 slices shown]
[im 1/28]
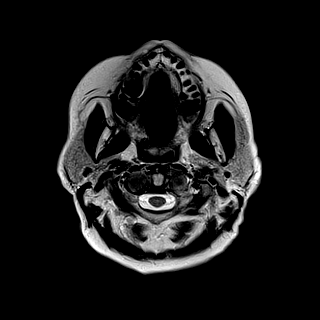

[Series 11: FLAIR · axial · 5.0mm · 0.45mm/px · 1 of 28 slices shown]
[im 1/28]
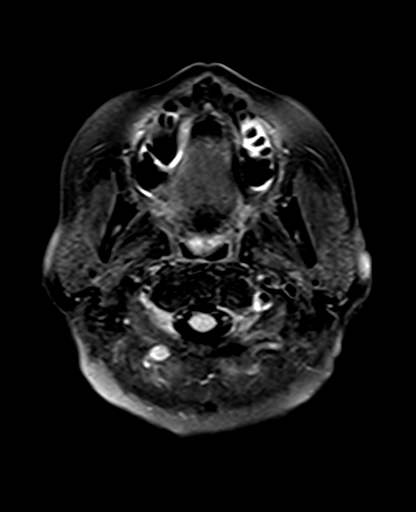

[Series 12: mag_images · axial · 3.0mm · 0.90mm/px · z∈[-116,+47]mm · 3 of 56 slices shown]
[im 1/56]
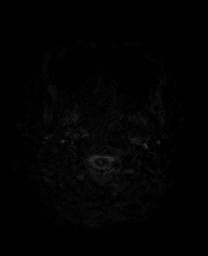
[im 28/56]
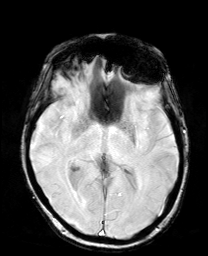
[im 56/56]
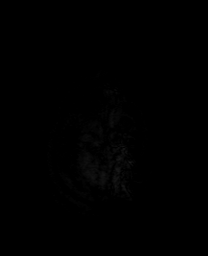

[Series 13: pha_images · axial · 3.0mm · 0.90mm/px · z∈[-116,+47]mm · 3 of 56 slices shown]
[im 1/56]
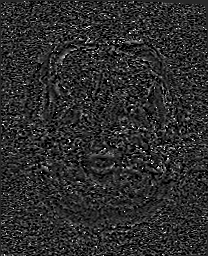
[im 28/56]
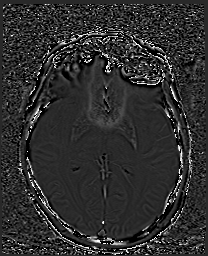
[im 56/56]
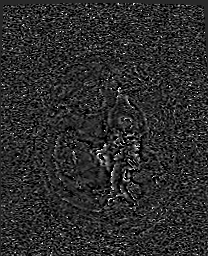

[Series 14: swi_images · axial · 3.0mm · 0.90mm/px · z∈[-116,+47]mm · 3 of 56 slices shown]
[im 1/56]
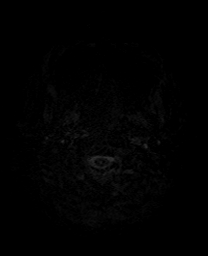
[im 28/56]
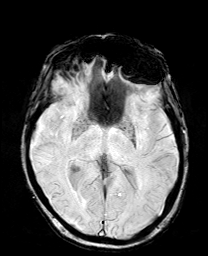
[im 56/56]
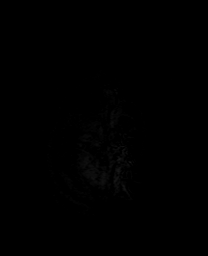

[Series 15: mip_images(sw) · axial · 24.0mm · 0.90mm/px · z∈[-106,+37]mm · 3 of 49 slices shown]
[im 1/49]
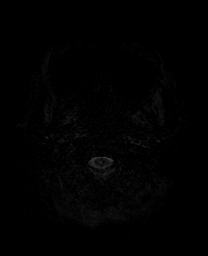
[im 25/49]
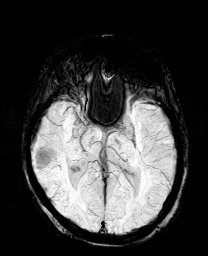
[im 49/49]
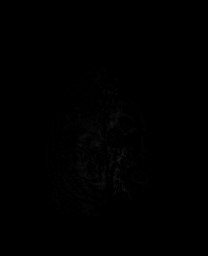

[Series 17: T2 post-contrast · coronal · 5.0mm · 0.72mm/px · 2 of 31 slices shown]
[im 1/31]
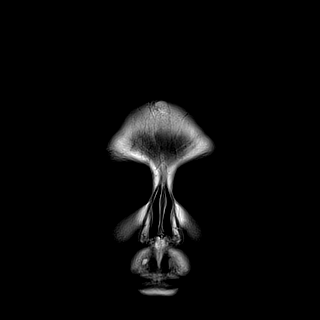
[im 31/31]
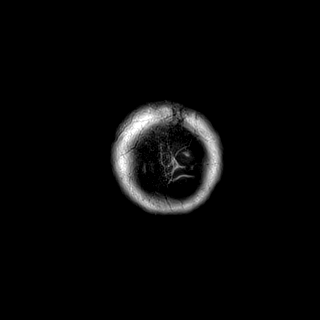

[Series 19: T1 post-contrast · coronal · 5.0mm · 0.34mm/px · 2 of 31 slices shown (1 of 2)]
[im 1/31]
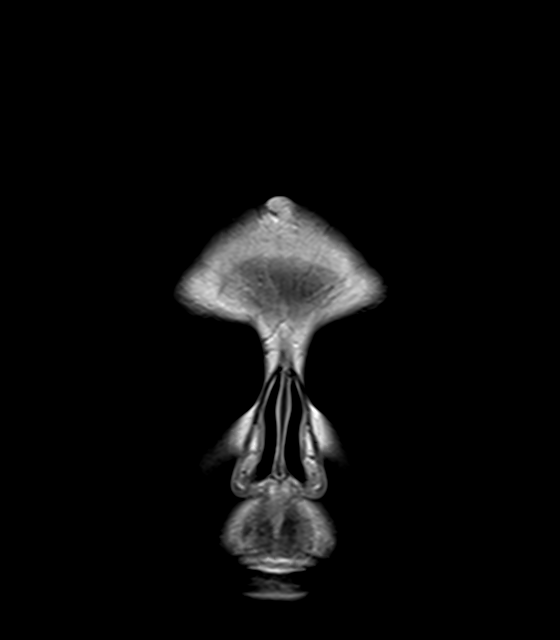
[im 31/31]
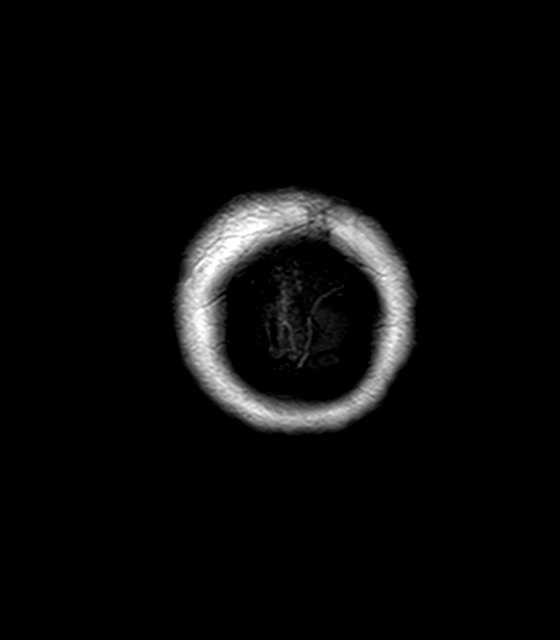

[Series 20: T1 post-contrast · sagittal · 5.0mm · 0.72mm/px · 1 of 25 slices shown (2 of 2)]
[im 1/25]
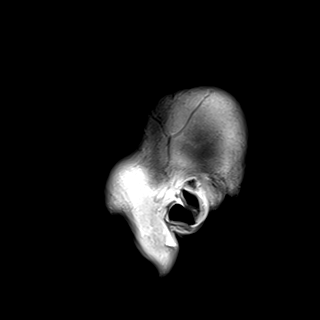

[33 of 48 positions shown; findings below may reference images not displayed]

FINDINGS: Brain: Cerebral volume stable, and remains within normal limits.
Chronic microvascular ischemic disease with a few scattered remote
lacunar infarcts about the left basal ganglia noted.

Postoperative changes from interval left frontal craniotomy for
tumor resection are seen. Postoperative pneumocephalus seen
subjacent to the craniotomy bone flap and overlying the anterior
left frontal convexity. Trace extra-axial collection measuring up to
2 mm noted overlying the left temporal occipital convexity.

Previously seen left frontal metastasis has been resected. Scattered
blood products noted within the resection cavity. Minimal smooth
enhancement about the margin of the resection cavity favored to be
postoperative. No visible residual tumor. Small focus of restricted
diffusion at the deep margin of the resection cavity consistent with
a small Peri resection infarct (series 5, image 91). Surrounding
vasogenic edema within the adjacent left cerebral hemisphere has
mildly worsened as compared to prior. No significant midline shift.

No other evidence for acute or subacute infarct. No other mass
lesion or abnormal enhancement. No hydrocephalus.

Vascular: Attenuated flow voids within the left M1 segment and
distal left MCA branches, consistent with known chronic left MCA
occlusion. Hypoplastic right vertebral artery. Major intracranial
vascular flow voids are otherwise maintained.

Skull and upper cervical spine: Craniocervical junction within
normal limits. Bone marrow signal intensity normal. Post craniotomy
changes at the left frontal scalp without adverse features.

Sinuses/Orbits: Prior ocular lens replacement on the right. Globes
and orbital soft tissues demonstrate no acute finding. Paranasal
sinuses and mastoid air cells are largely clear.

Other: None.
IMPRESSION: 1. Postoperative changes from interval left frontal craniotomy for
tumor resection. No MRI evidence for residual tumor.
2. Small focus of restricted diffusion at the deep margin of the
resection cavity consistent with a small peri resection infarct.
3. Mild interval worsening in vasogenic edema within the adjacent
left cerebral hemisphere. No midline shift.

## 2021-05-02 MED ORDER — GADOBUTROL 1 MMOL/ML IV SOLN
6.5000 mL | Freq: Once | INTRAVENOUS | Status: AC | PRN
  Administered 2021-05-02: 6.5 mL via INTRAVENOUS

## 2021-05-02 NOTE — Progress Notes (Signed)
Inpatient Rehabilitation Admissions Coordinator   I met with patient at bedside as she was up to chair with PT and OT. With her permission I contacted her son by phone to arrange meeting to begin discussions of her rehab venue options.He plans to call me between 1 and 130 today for [place] to meet with patient .  Danne Baxter, RN, MSN Rehab Admissions Coordinator [phone] 05/02/2021 11:11 AM

## 2021-05-02 NOTE — Evaluation (Signed)
Physical Therapy Re-Evaluation Patient Details Name: Angela Carpenter MRN: [id number] DOB: [birth year] Today's Date: 05/02/2021   History of Present Illness  pt is a 80 y/o female presenting to ED with worsening R sided weakness in setting of marked expansion of L frotnal hemorrhagic met with surrounding vasogenic edema. Patient now completely flaccid in RUE/RLE with dysarthria. Neurosurgery following for surgical intervention. 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  Clinical Impression  Pt re-evaluated s/p crani. Pt pleasant and motivated to participate and sit up in the chair. Requiring two person moderate assist for functional mobility. Able to weight shift in standing with right knee block, but unable to take steps. Performed stand pivot from bed to chair. Pt displays right hemiparesis, balance deficits, and decreased cognition. Suspect good progress given PLOF and motivation. Continue to recommend comprehensive inpatient rehab (CIR) for post-acute therapy needs.     Follow Up Recommendations CIR    Equipment Recommendations  Wheelchair (measurements PT);Wheelchair cushion (measurements PT);3in1 (PT)    Recommendations for Other Services Rehab consult     Precautions / Restrictions Precautions Precautions: Fall Precaution Comments: R hemiparesis Restrictions Weight Bearing Restrictions: No      Mobility  Bed Mobility Overal bed mobility: Needs Assistance Bed Mobility: Supine to Sit     Supine to sit: Mod assist;+2 for physical assistance;+2 for safety/equipment;HOB elevated     General bed mobility comments: modA +2 for stability of trunk and fluid movements of R side hemiplegia. Pt with difficulty following multi step commands    Transfers Overall transfer level: Needs assistance Equipment used:  2 person hand held assist Transfers: Sit to/from [facility] Sit to Stand: Mod assist;+2 physical assistance;+2 safety/equipment Stand pivot transfers: +2 physical assistance;+2 safety/equipment;Max assist       General transfer comment: modA +2 for stability and to maneuver hemipleigc from bed to recliner.  Ambulation/Gait                Stairs            Wheelchair Mobility    Modified Rankin (Stroke Patients Only)       Balance Overall balance assessment: Needs assistance Sitting-balance support: Feet supported;No upper extremity supported Sitting balance-Leahy Scale: Fair Sitting balance - Comments: [email or web address]   Standing balance support: Bilateral upper extremity supported (RUE supported by therapist) Standing balance-Leahy Scale: Poor Standing balance comment: standing at EOB with R knee intermittently blocked.                             Pertinent Vitals/Pain Pain Assessment: No/denies pain    Home Living Family/patient expects to be discharged to:: Private residence Living Arrangements: Alone Available Help at Discharge: Family Type of Home: House Home Access: Stairs to enter Entrance Stairs-Rails: Left;Right;Can reach both Entrance Stairs-Number of Steps: 4 Home Layout: Two level;Able to live on main level with bedroom/bathroom Home Equipment: Grab bars - tub/shower;Shower seat;Cane - single point;Walker - 2 wheels;Transport chair      Prior Function Level of Independence: Independent         Comments: I with ADLs/IADLs; recent stopped driving secondary to seizure     Hand Dominance   Dominant Hand: Right    Extremity/Trunk Assessment   Upper Extremity Assessment Upper Extremity Assessment: RUE deficits/detail;LUE deficits/detail RUE Deficits / Details:  R side hemiplegia; thumb activation flex and 3/5 hand squeeze x2 times. fatigues easily; otherwise, PROM, WFL RUE Sensation: WNL RUE Coordination:  decreased fine motor;decreased gross motor LUE Deficits / Details: strength 5/5. ROM, WNLs    Lower Extremity Assessment Lower Extremity Assessment: RLE deficits/detail RLE Deficits / Details: sensation intact; no knee buckling in standing; no AROM noted    Cervical / Trunk Assessment Cervical / Trunk Assessment: Normal  Communication   Communication: Expressive difficulties;Other (comment) (word finding difficulties)  Cognition Arousal/Alertness: Awake/alert Behavior During Therapy: WFL for tasks assessed/performed Overall Cognitive Status: Impaired/Different from baseline Area of Impairment: Memory;Problem solving;Awareness;Following commands                     Memory: Decreased short-term memory Following Commands: Follows multi-step commands inconsistently   Awareness: Intellectual Problem Solving: Slow processing;Decreased initiation;Difficulty sequencing General Comments: Pt with difficulty sequencing with verbal cues with novel tasks. Pt may require multimodal cues for novel tasks. Pt stating "I don't know."      General Comments General comments (skin integrity, edema, etc.): Pt with sensation in all limbs, but flaccidity and swelling noted on R side.    Exercises Other Exercises Other Exercises: PROM to RUE shoulder through digits in all planes x5 reps   Assessment/Plan    PT Assessment Patient needs continued PT services  PT Problem List Decreased activity tolerance;Decreased balance;Decreased mobility;Decreased knowledge of use of DME;Decreased safety awareness;Decreased knowledge of precautions       PT Treatment Interventions DME instruction;Gait training;Functional mobility training;Therapeutic activities;Therapeutic exercise;Balance training;Patient/family education;Stair training    PT Goals (Current goals can be found in the Care Plan section)  Acute Rehab PT Goals Patient Stated Goal: To go to CIR PT Goal Formulation: With patient Time For  Goal Achievement: 05/16/21 Potential to Achieve Goals: Good    Frequency Min 4X/week   Barriers to discharge        Co-evaluation PT/OT/SLP Co-Evaluation/Treatment: Yes Reason for Co-Treatment: Complexity of the patient's impairments (multi-system involvement);For patient/therapist safety;To address functional/ADL transfers PT goals addressed during session: Mobility/safety with mobility OT goals addressed during session: ADL's and self-care       AM-PAC PT "6 Clicks" Mobility  Outcome Measure Help needed turning from your back to your side while in a flat bed without using bedrails?: A Lot Help needed moving from lying on your back to sitting on the side of a flat bed without using bedrails?: A Lot Help needed moving to and from a bed to a chair (including a wheelchair)?: Total Help needed standing up from a chair using your arms (e.g., wheelchair or bedside chair)?: Total Help needed to walk in hospital room?: Total Help needed climbing 3-5 steps with a railing? : Total 6 Click Score: 8    End of Session Equipment Utilized During Treatment: Gait belt Activity Tolerance: Patient tolerated treatment well Patient left: in chair;with call bell/phone within reach;with chair alarm set Nurse Communication: Mobility status PT Visit Diagnosis: Unsteadiness on feet (R26.81);Other abnormalities of gait and mobility (R26.89);Hemiplegia and hemiparesis Hemiplegia - Right/Left: Right Hemiplegia - dominant/non-dominant: Dominant    Time: 4235-3614 PT Time Calculation (min) (ACUTE ONLY): 20 min   Charges:   PT Evaluation $PT Re-evaluation: 1 Re-eval          Wyona Almas, PT, DPT Acute Rehabilitation Services Pager [phone] Office [phone]   Deno Etienne 05/02/2021, 3:05 PM

## 2021-05-02 NOTE — Progress Notes (Signed)
[facility] PROGRESS NOTE    Progress Note  Angela Carpenter  NOB:[id number] DOB: 10/28/1940 DOA: 04/23/2021 PCP: Haywood Pao, MD     Brief Narrative:   Angela Carpenter is an 80 y.o. female past medical history of metastatic melanoma with known brain hemorrhagic left frontal lobe bleed status post SRS, breast cancer A. fib not on anticoagulation admitted due to right side worsening weakness in the setting of marked expansion of hemorrhagic met initially admitted under neurology service who consulted neuro oncologist and neurosurgery transferred to the hospitalist service she was on Decadron and Keppra, neurosurgery recommended surgical intervention which was done on 05/01/2021.    Assessment/Plan:   ICH (intracerebral hemorrhage) due to metastatic disease primary melanoma: . Currently on Decadron and Keppra. Status post surgical resection on 05/01/2021. Further management per neurosurgery.  Seizure-like activity: On 04/29/2021 she had 2-minute seizure activity.  CT scan of the head was done that showed unchanged size of intraparenchymal hematoma. She was started on Keppra 750 twice a day at that time.  Hyperlipidemia: Continue statins.  Essential hypertension: On no schedule antihypertensive medication continue labetalol IV as needed.   DVT prophylaxis: none Family Communication:none Status is: Inpatient  Remains inpatient appropriate because:Hemodynamically unstable  Dispo: The patient is from: Home              Anticipated d/c is to: SNF              Patient currently is not medically stable to d/c.   Difficult to place patient No        Code Status:     Code Status Orders  (From admission, onward)           Start     Ordered   04/23/21 1409  Do not attempt resuscitation (DNR)  Continuous       Question Answer Comment  In the event of cardiac or respiratory ARREST Do not call a "code blue"   In the event of cardiac or respiratory ARREST Do not  perform Intubation, CPR, defibrillation or ACLS   In the event of cardiac or respiratory ARREST Use medication by any route, position, wound care, and other measures to relive pain and suffering. May use oxygen, suction and manual treatment of airway obstruction as needed for comfort.      04/23/21 1408           Code Status History     Date Active Date Inactive Code Status Order ID Comments User Context   04/23/2021 1329 04/23/2021 1408 Full Code [id number]  Derek Jack, MD ED   12/11/2020 2238 12/12/2020 1719 Partial Code [id number]  Kerney Elbe, MD Inpatient   12/11/2020 1954 12/11/2020 2238 Partial Code [id number]  Lorenza Chick, MD Inpatient         IV Access:   Peripheral IV   Procedures and diagnostic studies:   MR BRAIN W WO CONTRAST  Result Date: 05/02/2021 CLINICAL DATA:  Follow-up examination status post stereotactic left frontal craniotomy for tumor resection. EXAM: MRI HEAD WITHOUT AND WITH CONTRAST TECHNIQUE: Multiplanar, multiecho pulse sequences of the brain and surrounding structures were obtained without and with intravenous contrast. CONTRAST:  6.20m GADAVIST GADOBUTROL 1 MMOL/ML IV SOLN COMPARISON:  Prior MRI from 03/23/2021. FINDINGS: Brain: Cerebral volume stable, and remains within normal limits. Chronic microvascular ischemic disease with a few scattered remote lacunar infarcts about the left basal ganglia noted. Postoperative changes from interval left frontal craniotomy for tumor resection are seen. Postoperative  pneumocephalus seen subjacent to the craniotomy bone flap and overlying the anterior left frontal convexity. Trace extra-axial collection measuring up to 2 mm noted overlying the left temporal occipital convexity. Previously seen left frontal metastasis has been resected. Scattered blood products noted within the resection cavity. Minimal smooth enhancement about the margin of the resection cavity favored to be postoperative. No visible residual tumor.  Small focus of restricted diffusion at the deep margin of the resection cavity consistent with a small Peri resection infarct (series 5, image 91). Surrounding vasogenic edema within the adjacent left cerebral hemisphere has mildly worsened as compared to prior. No significant midline shift. No other evidence for acute or subacute infarct. No other mass lesion or abnormal enhancement. No hydrocephalus. Vascular: Attenuated flow voids within the left M1 segment and distal left MCA branches, consistent with known chronic left MCA occlusion. Hypoplastic right vertebral artery. Major intracranial vascular flow voids are otherwise maintained. Skull and upper cervical spine: Craniocervical junction within normal limits. Bone marrow signal intensity normal. Post craniotomy changes at the left frontal scalp without adverse features. Sinuses/Orbits: Prior ocular lens replacement on the right. Globes and orbital soft tissues demonstrate no acute finding. Paranasal sinuses and mastoid air cells are largely clear. Other: None. IMPRESSION: 1. Postoperative changes from interval left frontal craniotomy for tumor resection. No MRI evidence for residual tumor. 2. Small focus of restricted diffusion at the deep margin of the resection cavity consistent with a small peri resection infarct. 3. Mild interval worsening in vasogenic edema within the adjacent left cerebral hemisphere. No midline shift. Electronically Signed   By: Jeannine Boga M.D.   On: 05/02/2021 02:59   EEG adult  Result Date: 04/30/2021 Lora Havens, MD     04/30/2021 10:49 AM Patient Name: Angela Carpenter MRN: [id number] Epilepsy Attending: Lora Havens Referring Physician/Provider: Dr Derrick Ravel Date: 04/30/2021 Duration: 29.21 mins Patient history: 80 year old female with seizure-like activity.  EEG to evaluate for seizures. Level of alertness: Awake, asleep AEDs during EEG study: LEV Technical aspects: This EEG study was done with scalp  electrodes positioned according to the 10-20 International system of electrode placement. Electrical activity was acquired at a sampling rate of '500Hz'  and reviewed with a high frequency filter of '70Hz'  and a low frequency filter of '1Hz' . EEG data were recorded continuously and digitally stored. Description: The posterior dominant rhythm consists of 9 Hz activity of moderate voltage (25-35 uV) seen predominantly in posterior head regions, asymmetric ( left<right) and reactive to eye opening and eye closing. Sleep was characterized by vertex waves, sleep spindles (12 to 14 Hz), maximal frontocentral region.  EEG also showed intermittent left temporal 3 to 5 Hz theta-delta slowing.  Hyperventilation and photic stimulation were not performed.   ABNORMALITY - Intermittent slow, left temporal region - Background asymmetry, left<right IMPRESSION: This study is suggestive of cortical dysfunction in left temporal region likely secondary to underlying hemorrhage. No seizures or epileptiform discharges were seen throughout the recording. [facility]:   None.   Subjective:    SAKARI RAISANEN no complaints.  Objective:    Vitals:   05/02/21 0500 05/02/21 0600 05/02/21 0700 05/02/21 0800  BP: (!) 113/59 114/62 132/68 124/61  Pulse: 65 65 68 66  Resp: '13 14 16 16  ' Temp:    98.4 F (36.9 C)  TempSrc:    Oral  SpO2: 98% 98% 95% 100%  Weight:      Height:  SpO2: 100 %   Intake/Output Summary (Last 24 hours) at 05/02/2021 1010 Last data filed at 05/02/2021 0800 Gross per 24 hour  Intake 4006.25 ml  Output 1495 ml  Net 2511.25 ml   Filed Weights   04/23/21 1038 04/24/21 0604  Weight: 68 kg 67.3 kg    Exam: General exam: In no acute distress. Respiratory system: Good air movement and clear to auscultation. Cardiovascular system: S1 & S2 heard, RRR. No JVD. Gastrointestinal system: Abdomen is nondistended, soft and nontender.  Extremities: No pedal edema. Skin:  No rashes, lesions or ulcers Data Reviewed:    Labs: Basic Metabolic Panel: Recent Labs  Lab 04/26/21 0752 04/30/21 0849  NA 136 131*  K 3.9 3.9  CL 101 100  CO2 25 23  GLUCOSE 115* 132*  BUN 25* 20  CREATININE 0.63 0.65  CALCIUM 9.1 8.6*  MG 2.3 2.2   GFR Estimated Creatinine Clearance: 53 mL/min (by C-G formula based on SCr of 0.65 mg/dL). Liver Function Tests: No results for input(s): AST, ALT, ALKPHOS, BILITOT, PROT, ALBUMIN in the last 168 hours. No results for input(s): LIPASE, AMYLASE in the last 168 hours. No results for input(s): AMMONIA in the last 168 hours. Coagulation profile No results for input(s): INR, PROTIME in the last 168 hours. COVID-19 Labs  No results for input(s): DDIMER, FERRITIN, LDH, CRP in the last 72 hours.  Lab Results  Component Value Date   SARSCOV2NAA NEGATIVE 04/23/2021   [place] NEGATIVE 12/11/2020   [place] NEGATIVE 05/29/2019    CBC: Recent Labs  Lab 04/26/21 0752 04/30/21 0849  WBC 19.4* 17.5*  NEUTROABS 16.9* 14.2*  HGB 14.8 15.1*  HCT 44.7 44.5  MCV 95.3 94.7  PLT 266 258   Cardiac Enzymes: No results for input(s): CKTOTAL, CKMB, CKMBINDEX, TROPONINI in the last 168 hours. BNP (last 3 results) No results for input(s): PROBNP in the last 8760 hours. CBG: No results for input(s): GLUCAP in the last 168 hours. D-Dimer: No results for input(s): DDIMER in the last 72 hours. Hgb A1c: No results for input(s): HGBA1C in the last 72 hours. Lipid Profile: No results for input(s): CHOL, HDL, LDLCALC, TRIG, CHOLHDL, LDLDIRECT in the last 72 hours. Thyroid function studies: No results for input(s): TSH, T4TOTAL, T3FREE, THYROIDAB in the last 72 hours.  Invalid input(s): FREET3 Anemia work up: No results for input(s): VITAMINB12, FOLATE, FERRITIN, TIBC, IRON, RETICCTPCT in the last 72 hours. Sepsis Labs: Recent Labs  Lab 04/26/21 0752 04/30/21 0849  WBC 19.4* 17.5*   Microbiology Recent Results (from the  past 240 hour(s))  Resp Panel by RT-PCR (Flu A&B, Covid) Nasopharyngeal Swab     Status: None   Collection Time: 04/23/21  1:28 PM   Specimen: Nasopharyngeal Swab; Nasopharyngeal(NP) swabs in vial transport medium  Result Value Ref Range Status   SARS Coronavirus 2 by RT PCR NEGATIVE NEGATIVE Final    Comment: (NOTE) SARS-CoV-2 target nucleic acids are NOT DETECTED.  The SARS-CoV-2 RNA is generally detectable in upper respiratory specimens during the acute phase of infection. The lowest concentration of SARS-CoV-2 viral copies this assay can detect is 138 copies/mL. A negative result does not preclude SARS-Cov-2 infection and should not be used as the sole basis for treatment or other patient management decisions. A negative result may occur with  improper specimen collection/handling, submission of specimen other than nasopharyngeal swab, presence of viral mutation(s) within the areas targeted by this assay, and inadequate number of viral copies(<138 copies/mL). A negative result must be combined  with clinical observations, patient history, and epidemiological information. The expected result is Negative.  Fact Sheet for Patients:  EntrepreneurPulse.com.au  Fact Sheet for Healthcare Providers:  IncredibleEmployment.be  This test is no t yet approved or cleared by the [place] FDA and  has been authorized for detection and/or diagnosis of SARS-CoV-2 by FDA under an Emergency Use Authorization (EUA). This EUA will remain  in effect (meaning this test can be used) for the duration of the COVID-19 declaration under Section 564(b)(1) of the Act, 21 U.S.C.section 360bbb-3(b)(1), unless the authorization is terminated  or revoked sooner.       Influenza A by PCR NEGATIVE NEGATIVE Final   Influenza B by PCR NEGATIVE NEGATIVE Final    Comment: (NOTE) The Xpert Xpress SARS-CoV-2/FLU/RSV plus assay is intended as an aid in the diagnosis of  influenza from Nasopharyngeal swab specimens and should not be used as a sole basis for treatment. Nasal washings and aspirates are unacceptable for Xpert Xpress SARS-CoV-2/FLU/RSV testing.  Fact Sheet for Patients: EntrepreneurPulse.com.au  Fact Sheet for Healthcare Providers: IncredibleEmployment.be  This test is not yet approved or cleared by the [place] FDA and has been authorized for detection and/or diagnosis of SARS-CoV-2 by FDA under an Emergency Use Authorization (EUA). This EUA will remain in effect (meaning this test can be used) for the duration of the COVID-19 declaration under Section 564(b)(1) of the Act, 21 U.S.C. section 360bbb-3(b)(1), unless the authorization is terminated or revoked.  Performed at [facility], [place] [address] [place], [place] [address]   MRSA Next Gen by PCR, Nasal     Status: None   Collection Time: 04/24/21  6:06 AM   Specimen: Nasal Mucosa; Nasal Swab  Result Value Ref Range Status   MRSA by PCR Next Gen NOT DETECTED NOT DETECTED Final    Comment: (NOTE) The GeneXpert MRSA Assay (FDA approved for NASAL specimens only), is one component of a comprehensive MRSA colonization surveillance program. It is not intended to diagnose MRSA infection nor to guide or monitor treatment for MRSA infections. Test performance is not FDA approved in patients less than 54 years old. Performed at [facility], [place] [address] [place], [place] [address]   Urine Culture     Status: None   Collection Time: 04/24/21  8:53 AM   Specimen: Urine, Clean Catch  Result Value Ref Range Status   Specimen Description URINE, CLEAN CATCH  Final   Special Requests NONE  Final   Culture   Final    NO GROWTH Performed at [facility], [place] [address], [place], [place] [address]    Report Status 04/25/2021 FINAL  Final     Medications:     stroke: mapping our early stages of recovery book   Does not apply  Once   acyclovir  400 mg Oral BID   atorvastatin  10 mg Oral Daily   Chlorhexidine Gluconate Cloth  6 each Topical Daily   dexamethasone  8 mg Oral Q12H   levETIRAcetam  750 mg Oral BID   metoprolol succinate  25 mg Oral BID   pantoprazole  40 mg Oral QHS   raloxifene  60 mg Oral Daily   senna-docusate  1 tablet Oral BID   Continuous Infusions:  sodium chloride 10 mL/hr at 05/01/21 1303   sodium chloride 75 mL/hr at 05/01/21 2055      LOS: 9 days   Charlynne Cousins  [facility]  05/02/2021, 10:10 AM

## 2021-05-02 NOTE — PMR Pre-admission (Signed)
PMR Admission Coordinator Pre-Admission Assessment  Patient: Angela Carpenter is an 80 y.o., female MRN: [id number] DOB: 01/18/1941 Height: _0  (165.1 cm) Weight: 67.3 kg  Insurance Information HMO:     PPO:      PCP:      IPA:      80/20:      OTHER:  PRIMARY: Medicare a and b      Policy#: [id number] pt      Subscriber: pt Benefits:  Phone #: passport one online     Name: 7/27 Eff. Date: 08/07/2008     Deduct: $1556      Out of Pocket Max: none      Life Max: none CIR: 100%      SNF: 20 full days Outpatient: 80%     Co-Pay: 20% Home Health: 100%      Co-Pay: none DME: 80%     Co-Pay: 20% Providers: pt choice  SECONDARY: Tricare for Life      Policy#: [id number]  Financial Counselor:       Phone#:   The "Data Collection Information Summary" for patients in Inpatient Rehabilitation Facilities with attached "Privacy Act [place] Records" was provided and verbally reviewed with: Patient and Family  Emergency Contact Information Contact Information     Name Relation Home Work Mobile   Byers,Scott Son   [phone]       Current Medical History  Patient Admitting Diagnosis: frontal lobe metastatic melanoma  History of Present Illness:  80 year old RH female with history of metastatic melanoma to brain with hemorrhagic metastases to left frontal lobe with residual right-sided weakness s/p SRS, breast cancer, A. fib who was admitted on 04/24/2019 with progression of right-sided weakness to flaccidity with difficulty walking as well as slurred speech.  MRI brain done showing significant interval increase in size of hemorrhagic left frontal lobe metastases with increased surrounding vasogenic edema and local mass-effect.  She was started on IV Decadron with serial CT of head for monitoring per neurology input.  Beryle Flock was discontinued and case was discussed with multidisciplinary cancer team.  Surgical resection for better disease control was recommended and scheduled for  07/26. Therapy has been ongoing during her stay and she was found to have mild expressive impairments with word finding deficits and paraphasias.  PT/OT has been working on bed mobility and standing attempts in steady.   She had generalized seizure activity on 07/24 and Keppra was increased to 750 mg twice daily.  Repeat CT of head was stable without increase in size of hemorrhagic lesion.  EEG done showing intermittent slowing in left temporal region likely secondary to underlying hemorrhage and no seizures. She underwent stereotactic left frontal craniotomy for resection of tumor by Dr. Kathyrn Sheriff on 06/26.    Patient's medical record from [facility] has been reviewed by the rehabilitation admission coordinator and physician.  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  Prior Rehab/Hospitalizations Has the patient had prior rehab or hospitalizations prior to admission? Yes  Has the patient had major surgery during 100 days prior to admission? Yes   Current Medications  Current Facility-Administered Medications:     stroke: mapping our early stages of recovery book, , Does not apply, Once, Derek Jack, MD   0.9 %  sodium chloride infusion, , Intravenous, Continuous, Pervis Hocking, DO, Last Rate: 10  mL/hr at 05/01/21 1303, New Bag at 05/01/21 1931   0.9 %  sodium chloride infusion, , Intravenous, Continuous, Consuella Lose, MD, Last Rate: 75 mL/hr at 05/01/21 2055, New Bag at 05/01/21 2055   acetaminophen (TYLENOL) tablet 650 mg, 650 mg, Oral, Q4H PRN, 650 mg at 05/03/21 0014 **OR** acetaminophen (TYLENOL) 160 MG/5ML solution 650 mg, 650 mg, Per Tube, Q4H PRN **OR** acetaminophen (TYLENOL) suppository 650 mg, 650 mg, Rectal, Q4H PRN, Derek Jack, MD   acyclovir (ZOVIRAX) tablet 400 mg, 400 mg, Oral, BID, Shadad, Mathis Dad, MD, 400 mg at 05/03/21 0935   atorvastatin (LIPITOR) tablet 10 mg, 10 mg, Oral, Daily, Derek Jack, MD, 10 mg at 05/03/21 8563   Chlorhexidine Gluconate Cloth 2 % PADS 6 each, 6 each, Topical, Daily, Derek Jack, MD, 6 each at 05/03/21 0936   dexamethasone (DECADRON) tablet 8 mg, 8 mg, Oral, Q12H, Derek Jack, MD, 8 mg at 05/03/21 0934   HYDROcodone-acetaminophen (NORCO/VICODIN) 5-325 MG per tablet 1 tablet, 1 tablet, Oral, Q4H PRN, Consuella Lose, MD, 1 tablet at 05/02/21 0015   labetalol (NORMODYNE) injection 10-40 mg, 10-40 mg, Intravenous, Q10 min PRN, Consuella Lose, MD   levETIRAcetam (KEPPRA) tablet 750 mg, 750 mg, Oral, BID, Consuella Lose, MD, 750 mg at 05/03/21 0935   metoprolol succinate (TOPROL-XL) 24 hr tablet 25 mg, 25 mg, Oral, BID, Derek Jack, MD, 25 mg at 05/03/21 0934   morphine 2 MG/ML injection 1-2 mg, 1-2 mg, Intravenous, Q2H PRN, Consuella Lose, MD   ondansetron (ZOFRAN) tablet 4 mg, 4 mg, Oral, Q4H PRN **OR** ondansetron (ZOFRAN) injection 4 mg, 4 mg, Intravenous, Q4H PRN, Consuella Lose, MD   pantoprazole (PROTONIX) EC tablet 40 mg, 40 mg, Oral, QHS, Henri Medal, RPH, 40 mg at 05/02/21 2128   promethazine (PHENERGAN) tablet 12.5-25 mg, 12.5-25 mg, Oral, Q4H PRN, Consuella Lose, MD   raloxifene (EVISTA) tablet 60 mg, 60 mg, Oral, Daily, Pahwani, Ravi, MD, 60 mg at 05/03/21 0935   senna-docusate  (Senokot-S) tablet 1 tablet, 1 tablet, Oral, BID, Derek Jack, MD, 1 tablet at 05/03/21 0935  Patients Current Diet:  Diet Order             Diet - low sodium heart healthy           Diet Carb Modified Fluid consistency: Thin; Room service appropriate? Yes  Diet effective now                   Precautions / Restrictions Precautions Precautions: Fall Precaution Comments: R hemiparesis Restrictions Weight Bearing Restrictions: No RLE Weight Bearing: Weight bearing as tolerated   Has the patient had 2 or more falls or a fall with injury in the past year? No  Prior Activity Level Limited Community (1-2x/wk): was independent until 2 weeks pta living alone  Prior Functional Level Self Care:  Did the patient need help bathing, dressing, using the toilet or eating? Independent  Indoor Mobility: Did the patient need assistance with walking from room to room (with or without device)? Independent  Stairs: Did the patient need assistance with internal or external stairs (with or without device)? Independent  Functional Cognition: Did the patient need help planning regular tasks such as shopping or remembering to take medications? Independent  Home Assistive Devices / Equipment Home Assistive Devices/Equipment: [employer] fast streamer Home Equipment: Grab bars - tub/shower, Shower seat, Radio producer - single point, Environmental consultant - 2 wheels, Transport chair  Prior Device Use: Indicate devices/aids used by the patient prior to current illness, exacerbation or injury? None of the above  Current Functional Level Cognition  Overall Cognitive Status: Impaired/Different from baseline Orientation Level: Oriented X4 Following Commands: Follows multi-step commands inconsistently General Comments: Pt with difficulty sequencing with verbal cues with novel tasks. Pt may require multimodal cues for novel tasks. Pt stating "I don't know."    Extremity Assessment (includes Sensation/Coordination)  Upper Extremity  Assessment: RUE deficits/detail, LUE deficits/detail RUE Deficits / Details: R side hemiplegia; thumb activation flex and 3/5 hand squeeze x2 times. fatigues easily; otherwise, PROM, WFL RUE Sensation: WNL RUE Coordination: decreased fine motor, decreased gross motor LUE Deficits / Details: strength 5/5. ROM, WNLs  Lower Extremity Assessment: RLE deficits/detail RLE Deficits / Details: sensation intact; no knee buckling in standing; no AROM noted    ADLs  Overall ADL's : Needs assistance/impaired Eating/Feeding: Minimal assistance, Sitting Grooming: Minimal assistance, Sitting Upper Body Bathing: Moderate assistance, Sitting Lower Body Bathing: Maximal assistance, Sit to/from stand, Bed level Upper Body Dressing : Moderate assistance, Sitting, Cueing for compensatory techniques, Cueing for sequencing Lower Body Dressing: Total assistance, Cueing for safety, Sitting/lateral leans, Sit to/from stand Lower Body Dressing Details (indicate cue type and reason): totalA for sock donning Toilet Transfer: Moderate assistance, +2 for physical assistance, +2 for safety/equipment, Stand-pivot Toilet Transfer Details (indicate cue type and reason): R knee blocked pivot from bed to recliner Toileting- Clothing Manipulation and Hygiene: Total assistance Functional mobility during ADLs: Moderate assistance, +2 for physical assistance, +2 for safety/equipment, Cueing for safety, Cueing for sequencing General ADL Comments: Pt severely limited with R sided hemiplegia. Pt reports "I was able to move my R leg before brain surgery yesterday and now I cannot.' Pt limited by decreased strength, decreased ability to care for self and decreased mobility.    Mobility  Overal bed mobility: Needs Assistance Bed Mobility: Supine to Sit Supine to sit: Mod assist, +2 for physical assistance, +2 for safety/equipment, HOB elevated Sit to supine: Max assist, +2 for physical assistance General bed mobility comments: modA +2  for stability of trunk and fluid movements of R side hemiplegia. Pt with difficulty following multi step commands    Transfers  Overall transfer level: Needs assistance Equipment used: 2 person hand held assist Transfers: Sit to/from Stand, Stand Pivot Transfers Sit to Stand: Mod assist, +2 physical assistance, +2 safety/equipment Stand pivot transfers: +2 physical assistance, +2 safety/equipment, Max assist General transfer comment: modA +2 for stability and to maneuver hemipleigc from bed to recliner.    Ambulation / Gait / Stairs / Wheelchair Mobility  Ambulation/Gait General Gait Details: Did not attempt    Posture / Balance Dynamic Sitting Balance Sitting balance - Comments: [email or web address] Balance Overall balance assessment: Needs assistance Sitting-balance support: Feet supported, No upper extremity supported Sitting balance-Leahy Scale: Fair Sitting balance - Comments: [email or web address] Postural control: Posterior lean Standing balance support: Bilateral upper extremity  supported (RUE supported by therapist) Standing balance-Leahy Scale: Poor Standing balance comment: standing at EOB with R knee intermittently blocked.    Special needs/care consideration Seizure precautions   Previous Home Environment  Living Arrangements: Alone  Lives With: Alone Available Help at Discharge: Available 24 hours/day (son works form home; other family will assist) Type of Home: House Home Layout: Two level, Able to live on main level with bedroom/bathroom Alternate Level Stairs-Number of Steps: flight Home Access: Stairs to enter Entrance Stairs-Rails: Left, Right, Can reach both Entrance Stairs-Number of Steps: 4 Bathroom Shower/Tub: Multimedia programmer: Programmer, systems: Yes [place]: Yes  Discharge Living Setting Plans for Discharge Living Setting: Patient's home (son and family will provide 24/7 assist) Type of Home at Discharge: House Discharge Home  Layout: Able to live on main level with bedroom/bathroom, Two level Alternate Level Stairs-Number of Steps: flight Discharge Home Access: Stairs to enter Entrance Stairs-Rails: Right, Left, Can reach both Entrance Stairs-Number of Steps: 4 Discharge Bathroom Shower/Tub: Walk-in shower Discharge Bathroom Toilet: Standard Discharge Bathroom Accessibility: Yes How Accessible: Accessible via walker Does the patient have any problems obtaining your medications?: No  Social/Family/Support Systems Contact Information: son, Event organiser Anticipated Caregiver: Event organiser, brother and sister in law and sister Anticipated Caregiver's Contact Information: see above Ability/Limitations of Caregiver: son works from home Caregiver Availability: 24/7 Discharge Plan Discussed with Primary Caregiver: Yes Is Caregiver In Agreement with Plan?: Yes Does Caregiver/Family have Issues with Lodging/Transportation while Pt is in Rehab?: No  Goals Patient/Family Goal for Rehab: min assist with PT and OT, supervision with SLP Expected length of stay: ELOS 2 to 3 weeks Pt/Family Agrees to Admission and willing to participate: Yes Program Orientation Provided & Reviewed with Pt/Caregiver Including Roles  & Responsibilities: Yes  Decrease burden of Care through IP rehab admission: n/a  Possible need for SNF placement upon discharge: not anticipated  Patient Condition: I have reviewed medical records from [facility], spoken with CM, and patient, son, and family member. I met with patient at the bedside for inpatient rehabilitation assessment.  Patient will benefit from ongoing PT and OT, can actively participate in 3 hours of therapy a day 5 days of the week, and can make measurable gains during the admission.  Patient will also benefit from the coordinated team approach during an Inpatient Acute Rehabilitation admission.  The patient will receive intensive therapy as well as Rehabilitation physician, nursing, social  worker, and care management interventions.  Due to bladder management, bowel management, safety, skin/wound care, disease management, medication administration, pain management, and patient education the patient requires 24 hour a day rehabilitation nursing.  The patient is currently mod to max assist overall with mobility and basic ADLs.  Discharge setting and therapy post discharge at home with home health is anticipated.  Patient has agreed to participate in the Acute Inpatient Rehabilitation Program and will admit today.  Preadmission Screen Completed By:  Cleatrice Burke, 05/03/2021 11:57 AM ______________________________________________________________________   Discussed status with Dr. Dagoberto Ligas on  05/03/2021 at 1157 and received approval for admission today.  Admission Coordinator:  Cleatrice Burke, RN, time 2542 Date 05/03/2021   Assessment/Plan: Diagnosis: Does the need for close, 24 hr/day Medical supervision in concert with the patient's rehab needs make it unreasonable for this patient to be served in a less intensive setting? Yes Co-Morbidities requiring supervision/potential complications: R hemiplegia due to worsening L frontal lobe mets/hemorrhage- s/p crani with seizures and aphasia Due to bladder management, bowel management,  safety, skin/wound care, disease management, medication administration, pain management, and patient education, does the patient require 24 hr/day rehab nursing? Yes Does the patient require coordinated care of a physician, rehab nurse, PT, OT, and SLP to address physical and functional deficits in the context of the above medical diagnosis(es)? Yes Addressing deficits in the following areas: balance, endurance, locomotion, strength, transferring, bowel/bladder control, bathing, dressing, feeding, grooming, toileting, cognition, speech, and language Can the patient actively participate in an intensive therapy program of at least 3 hrs of therapy 5  days a week? Yes The potential for patient to make measurable gains while on inpatient rehab is fair Anticipated functional outcomes upon discharge from inpatient rehab: min assist PT, min assist OT, supervision and min assist SLP Estimated rehab length of stay to reach the above functional goals is: 2-3 weeks Anticipated discharge destination: Home 10. Overall Rehab/Functional Prognosis: fair   MD Signature:

## 2021-05-02 NOTE — Progress Notes (Signed)
Orthopedic Tech Progress Note Patient Details:  Angela Carpenter [birth year] [id number]  Ortho Devices Type of Ortho Device: Arm sling Ortho Device/Splint Location: RUE Ortho Device/Splint Interventions: Ordered, Application, Adjustment   Post Interventions Patient Tolerated: Well Instructions Provided: Care of [place] 05/02/2021, 6:42 PM

## 2021-05-02 NOTE — Progress Notes (Signed)
  NEUROSURGERY PROGRESS NOTE   No issues overnight. Minima HA this am. No new complaints.  EXAM:  BP 116/66   Pulse 68   Temp 98.4 F (36.9 C) (Oral)   Resp 17   Ht $R'5\' 5"'SE$  (1.651 m)   Wt 67.3 kg   SpO2 98%   BMI 24.69 kg/m   Awake, alert, oriented  Speech fluent, appropriate  CN grossly intact  5/5 BUE/BLE   IMAGING: MRI reviewed, demonstrates good resection. Slightly increased surrounding edema.  IMPRESSION:  80 y.o. female POD#1 s/p left frontal crani for resection of metastatic hemorrhagic melanoma met. At baseline with right hemiplegia.  PLAN: - PT/OT today - d/c A-line - transfer back to floor.   Consuella Lose, MD [facility]

## 2021-05-02 NOTE — Evaluation (Signed)
Occupational Therapy Evaluation Patient Details Name: Angela Carpenter MRN: [id number] DOB: 1940-11-09 Today's Date: 05/02/2021    History of Present Illness pt is a 80 y/o female presenting to ED with worsening R sided weakness in setting of marked expansion of L frotnal hemorrhagic met with surrounding vasogenic edema. Patient now completely flaccid in RUE/RLE with dysarthria. Neurosurgery following for surgical intervention. 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   Clinical Impression   Pt re-evaluation s/p crani. Pt was very independent prior to episode of  progressive R sided weakness. Pt currently, severely limited with R sided hemiplegia. Pt reports "I was able to move my R leg before brain surgery yesterday and now I cannot.' Pt limited by decreased strength, decreased ability to care for self and decreased mobility.  Pt minA to [place] for ADL; modA +2 for bed mobility, sit to stands and for stand pivot transfers. Pt with questionable cognitive deficits with problem solving and safety awareness. Pt would benefit from continued OT skilled services for ADL, mobility and cognition. OT following acutely.  BP: at EOB 162/81; sitting in recliner 131/68, 79 BPM.     Follow Up Recommendations  CIR    Equipment Recommendations  3 in 1 bedside commode;Hospital bed;Wheelchair (measurements OT);Wheelchair cushion (measurements OT)    Recommendations for Other Services Rehab consult     Precautions / Restrictions Precautions Precautions: Fall Precaution Comments: R hemiplegia Restrictions Weight Bearing Restrictions: No      Mobility Bed Mobility Overal bed mobility: Needs Assistance Bed Mobility: Supine to Sit     Supine to sit: Mod assist;+2 for physical assistance;+2 for safety/equipment;HOB elevated      General bed mobility comments: modA +2 for stability of trunk and fluid movements of R side hemiplegia. Pt with difficulty following multi step commands    Transfers Overall transfer level: Needs assistance Equipment used: Ambulation equipment used Transfers: Sit to/from [facility] Sit to Stand: Mod assist;+2 physical assistance;+2 safety/equipment Stand pivot transfers: Mod assist;+2 physical assistance;+2 safety/equipment       General transfer comment: modA +2 for stability and to maneuver R hemiplegiac side from bed to recliner.    Balance Overall balance assessment: Needs assistance Sitting-balance support: Feet supported;No upper extremity supported Sitting balance-Leahy Scale: Fair Sitting balance - Comments: [email or web address]   Standing balance support: Bilateral upper extremity supported (RUE supported by therapist) Standing balance-Leahy Scale: Poor Standing balance comment: standing at EOB with R knee intermittently blocked.                           ADL either performed or assessed with clinical judgement   ADL Overall ADL's : Needs assistance/impaired Eating/Feeding: Minimal assistance;Sitting   Grooming: Minimal assistance;Sitting   Upper Body Bathing: Moderate assistance;Sitting   Lower Body Bathing: Maximal assistance;Sit to/from stand;Bed level   Upper Body Dressing : Moderate assistance;Sitting;Cueing for compensatory techniques;Cueing for sequencing   Lower Body Dressing: Total assistance;Cueing for safety;Sitting/lateral leans;Sit to/from stand Lower Body Dressing Details (indicate cue type and reason): totalA for sock donning Toilet Transfer: Moderate assistance;+2 for physical assistance;+2 for safety/equipment;Stand-pivot Toilet Transfer Details (indicate cue type and reason): R knee blocked pivot from bed to recliner Toileting- Clothing Manipulation and Hygiene: Total assistance       Functional mobility during ADLs: Moderate  assistance;+2 for physical assistance;+2  for safety/equipment;Cueing for safety;Cueing for sequencing General ADL Comments: Pt severely limited with R sided hemiplegia. Pt reports "I was able to move my R leg before brain surgery yesterday and now I cannot.' Pt limited by decreased strength, decreased ability to care for self and decreased mobility.     Vision Baseline Vision/History: Wears glasses Wears Glasses:  (Reports using glasses for watching tv or using computer only.) Patient Visual Report: No change from baseline Vision Assessment?: No apparent visual deficits     Perception Perception Perception Tested?: Yes Perception Deficits: Body part identification Comments: Pt appears WFLs   Praxis      Pertinent Vitals/Pain Pain Assessment: No/denies pain     Hand Dominance Right   Extremity/Trunk Assessment Upper Extremity Assessment Upper Extremity Assessment: RUE deficits/detail;LUE deficits/detail RUE Deficits / Details: R side hemiplegia; thumb activation flex and 3/5 hand squeeze x2 times. fatigues easily; otherwise, PROM, WFL RUE Sensation: WNL RUE Coordination: decreased fine motor;decreased gross motor LUE Deficits / Details: strength 5/5. ROM, WNLs   Lower Extremity Assessment Lower Extremity Assessment: RLE deficits/detail RLE Deficits / Details: sensation intact; no knee buckling in standing; no AROM noted   Cervical / Trunk Assessment Cervical / Trunk Assessment: Normal   Communication Communication Communication: Expressive difficulties;Other (comment) (word finding difficulties)   Cognition Arousal/Alertness: Awake/alert Behavior During Therapy: WFL for tasks assessed/performed Overall Cognitive Status: Impaired/Different from baseline Area of Impairment: Memory;Problem solving;Awareness                     Memory: Decreased short-term memory     Awareness: Intellectual Problem Solving: Slow processing;Decreased initiation;Difficulty  sequencing General Comments: Pt with difficulty sequencing with verbal cues with novel tasks. Pt may require multimodal cues for novel tasks. Pt stating "I don't know   General Comments  Pt with sensation in all limbs, but flaccidity and swelling noted on R side.    Exercises Exercises: Other exercises Other Exercises Other Exercises: PROM to RUE shoulder through digits in all planes x5 reps   Shoulder Instructions      Home Living Family/patient expects to be discharged to:: Private residence Living Arrangements: Alone Available Help at Discharge: Family Type of Home: House Home Access: Stairs to enter [employer] of Steps: 4 Entrance Stairs-Rails: Left;Right;Can reach both Home Layout: Two level;Able to live on main level with bedroom/bathroom Alternate Level Stairs-Number of Steps: flight   Bathroom Shower/Tub: Occupational psychologist: Standard     Home Equipment: Grab bars - tub/shower;Shower seat;Cane - single point;Walker - 2 wheels;Transport chair      Lives With: Alone    Prior Functioning/Environment Level of Independence: Independent        Comments: I with ADLs/IADLs; recent stopped driving secondary to seizure        OT Problem List: Decreased strength;Decreased range of motion;Decreased activity tolerance;Impaired balance (sitting and/or standing);Decreased coordination;Decreased knowledge of use of DME or AE;Impaired tone;Impaired UE functional use;Pain;Increased edema      OT Treatment/Interventions: Self-care/ADL training;Therapeutic exercise;Neuromuscular education;DME and/or AE instruction;Therapeutic activities;Patient/family education;Balance training;Cognitive remediation/compensation    OT Goals(Current goals can be found in the care plan section) Acute Rehab OT Goals Patient Stated Goal: To go to CIR OT Goal Formulation: With patient Time For Goal Achievement: 05/16/21 Potential to Achieve Goals: Good ADL Goals Pt Will  Perform Eating: Independently;sitting Pt Will Perform Grooming: with set-up;sitting Pt Will Perform Upper Body Dressing: with set-up Pt Will Perform Lower Body Dressing: with min assist;sit to/from stand;sitting/lateral leans Pt Will  Transfer to Toilet: with min assist;bedside commode;ambulating Pt Will Perform Toileting - Clothing Manipulation and hygiene: with min assist;sit to/from stand Additional ADL Goal #1: pt will complete sit to stand transfers with minA +2 and use of AD PRN Additional ADL Goal #2: Pt will increase to minA for bed mobility as precursor for OOB ADL. Additional ADL Goal #3: Pt will recall use and name of 3 ADL items with <1 cue in 3/4 trials with 90% accuracy.  OT Frequency: Min 2X/week   Barriers to D/C: Inaccessible home environment;Decreased caregiver support          Co-evaluation PT/OT/SLP Co-Evaluation/Treatment: Yes Reason for Co-Treatment: Complexity of the patient's impairments (multi-system involvement);For patient/therapist safety   OT goals addressed during session: ADL's and self-care      AM-PAC OT "6 Clicks" Daily Activity     Outcome Measure Help from another person eating meals?: A Little Help from another person taking care of personal grooming?: A Lot Help from another person toileting, which includes using toliet, bedpan, or urinal?: A Lot Help from another person bathing (including washing, rinsing, drying)?: A Lot Help from another person to put on and taking off regular upper body clothing?: A Lot Help from another person to put on and taking off regular lower body clothing?: Total 6 Click Score: 12   End of Session Equipment Utilized During Treatment: Gait belt Nurse Communication: Mobility status  Activity Tolerance: Patient tolerated treatment well Patient left: in chair;with call bell/phone within reach;with chair alarm set  OT Visit Diagnosis: Unsteadiness on feet (R26.81);Other abnormalities of gait and mobility  (R26.89);Muscle weakness (generalized) (M62.81);Hemiplegia and hemiparesis Hemiplegia - Right/Left: Right Hemiplegia - dominant/non-dominant: Dominant Hemiplegia - caused by: Other Nontraumatic intracranial hemorrhage                Time: 9704-4925 OT Time Calculation (min): 34 min Charges:  OT General Charges $OT Visit: 1 Visit OT Evaluation $OT Re-eval: 1 Re-eval  Jefferey Pica, OTR/L Acute Rehabilitation Services Pager: [phone] Office: [phone]   Nicolus Ose C 05/02/2021, 2:18 PM

## 2021-05-02 NOTE — Progress Notes (Signed)
Inpatient Rehabilitation Admissions Coordinator   I met with patient , her son and brother at bedside. We discussed goals and expectations of a possible Cir admit. Son and family can provide 24/7 assistance that will be required after a CIR admit. Son works from home and family will assist. She would benefit from a CIR admit and is an excellent candidate. I will clarify with MDs when felt medically ready. Hopeful for this week. I will follow up tomorrow.  Danne Baxter, RN, MSN Rehab Admissions Coordinator [phone] 05/02/2021 1:29 PM

## 2021-05-02 NOTE — H&P (Signed)
Physical Medicine and Rehabilitation Admission H&P    Chief Complaint  Patient presents with   Functional deficits due to right hemiplegia    Progression of left frontal lobe metastatic melanoma     HPI: Angela Carpenter is a 80 year old RH female with history of 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 Fib (off Eliquis since 11/2020) who was admitted on 04/23/2021 with 4 week of progression of right-sided weakness to flaccidity with difficulty walking as well as slurred speech.  MRI brain done showing significant interval increase in size of hemorrhagic left frontal lobe metastases with increased surrounding vasogenic edema and local mass-effect.  She was started on IV Decadron with serial CT of head for monitoring per neurology input.  Beryle Flock was discontinued and case was discussed by   Dr. Alen Blew with multidisciplinary cancer team.  Surgical resection for better disease control was recommended and scheduled for 07/26. Therapy has been ongoing during her stay and she was found to have mild expressive impairments with word finding deficits and paraphasias.  PT/OT has been working on bed mobility and standing attempts in steady. She had generalized seizure activity on 07/24 and Keppra was increased to 750 mg twice daily.  Repeat CT of head was stable without increase in size of hemorrhagic lesion.  EEG done showing intermittent slowing in left temporal region likely secondary to underlying hemorrhage and no seizures. She underwent stereotactic left frontal craniotomy for resection of tumor by Dr. Kathyrn Sheriff on 06/26.  To continue decadron with taper on outpatient basis. Therapy re-evals completed showing ongoing deficits due to right hemiplegia, difficulty following multi step commands as well as difficulty with ADL tasks. CIR recommended due to functional decline.    Pt reports now has R  hemiplegia Needs to pee/poop- foley out this AM- hasn't voided yet.  LBM 7/25, but "hasn't eaten much".  No real pain- tylenol worked yesterday but nothing needed today.    Review of Systems  Constitutional:  Negative for chills and fever.  HENT:  Negative for hearing loss and tinnitus.   Eyes:  Negative for blurred vision and double vision.  Respiratory:  Negative for cough and shortness of breath.   Cardiovascular:  Negative for chest pain and palpitations.  Gastrointestinal:  Positive for constipation. Negative for abdominal pain, heartburn and nausea.  Genitourinary:  Negative for dysuria and urgency.       Cannot void- got foley out this AM  Musculoskeletal:  Positive for falls (slid to the floor and was dragging her leg). Negative for back pain, joint pain and myalgias.  Skin:  Negative for itching and rash.  Neurological:  Positive for speech change and weakness. Negative for dizziness and headaches.  Psychiatric/Behavioral:  The patient does not have insomnia.   All other systems reviewed and are negative.   Past Medical History:  Diagnosis Date   A-fib [facility])    Atrial fibrillation ([place])    4 years ago started   Brain cancer ([place]) 12/2020   Breast cancer ([place]) 2006   Colon polyps    Glaucoma    Hypercholesteremia    Hypertension    patient denies    Osteoporosis    Personal history of chemotherapy 2006   Personal history of radiation therapy 2006   Stroke (cerebrum) [facility]) 2015    Past Surgical History:  Procedure Laterality Date   AUGMENTATION MAMMAPLASTY Bilateral 2006  Patient had them removed in 2008   BREAST BIOPSY Left 2016   BREAST LUMPECTOMY Right 2006   COLONOSCOPY     had polyps   EYE SURGERY Right    cataract   MELANOMA EXCISION WITH SENTINEL LYMPH NODE BIOPSY N/A 06/02/2019   Procedure: Left lateral ANAL MELANOMA EXCISION WITH Bilateral Groin exploration and SENTINEL node mapping;  Surgeon: [name], MD;  Location: [employer];  Service: General;   Laterality: N/A;   TONSILLECTOMY      Family History  Problem Relation Age of Onset   Lung cancer Mother    Hypotension Mother    Pancreatic cancer Father    Hypertension Sister    Kidney cancer Brother    Cirrhosis Maternal Grandfather    Breast cancer Paternal Grandmother    Cirrhosis Paternal Grandfather    Breast cancer Paternal Aunt     Social History: Lives alone and was independent but struggling for few weeks prior to admission.  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 She reports that she smoked briefly in college--never inhaled. She has been exposed to tobacco smoke. She has never used smokeless tobacco. She reports that she does not drink alcohol and does not use drugs.   Allergies: No Known Allergies   Facility-Administered Medications Prior to Admission  Medication Dose Route Frequency Provider Last Rate Last Admin   0.9 %  sodium chloride infusion  500 mL Intravenous Continuous Danis, Estill Cotta III, MD       Medications Prior to Admission  Medication Sig Dispense Refill   acyclovir (ZOVIRAX) 400 MG tablet Take 400 mg by mouth 2 (two) times daily.     atorvastatin (LIPITOR) 10 MG tablet Take 1 tablet (10 mg total) by mouth daily. 90 tablet 0   Cholecalciferol (VITAMIN D-3) 125 MCG (5000 UT) TABS Take 5,000 Units by mouth daily.      levETIRAcetam (KEPPRA) 500 MG tablet Take 1 tablet (500 mg total) by mouth 2 (two) times daily. 60 tablet 0   metoprolol succinate (TOPROL-XL) 25 MG 24 hr tablet Take 1 tablet (25 mg total) by mouth 2 (two) times daily. 180 tablet 0   Multiple Vitamins-Minerals (PRESERVISION AREDS 2+MULTI VIT) CAPS Take 1 capsule by mouth 2 (two) times daily.     raloxifene (EVISTA) 60 MG tablet Take 60 mg by mouth in the morning.     timolol (TIMOPTIC) 0.5 % ophthalmic solution Place 1 drop into both eyes 2 (two) times daily.     [DISCONTINUED] dexamethasone (DECADRON) 1 MG tablet Take 3 tablets (3 mg total) by  mouth daily. (Patient taking differently: Take 2-3 mg by mouth See admin instructions. Take 3 mg by mouth in the morning from 04/20/2021-04/24/2021, then, on 04/25/2021, decrease to 2 mg in the morning once daily (if tolerated) until seen again by Dr. Mickeal Skinner) 90 tablet 1    Drug Regimen Review  Drug regimen was reviewed and remains appropriate with no significant issues identified  Home: Home Living Family/patient expects to be discharged to:: Private residence Living Arrangements: Alone Available Help at Discharge: Available 24 hours/day (son works form home; other family will assist) Type of Home: House Home Access: Stairs to enter [employer] of Steps: 4 Entrance Stairs-Rails: Left, Right, Can reach both Home Layout: Two level, Able to live on main level with bedroom/bathroom Alternate Level Stairs-Number of Steps: flight Bathroom Shower/Tub: Multimedia programmer: Standard Bathroom Accessibility: Yes Home Equipment: Grab bars - tub/shower, Civil engineer, contracting, Radio producer -  single point, Walker - 2 wheels, Transport chair  Lives With: Alone   Functional History: Prior Function Level of Independence: Independent Comments: I with ADLs/IADLs; recent stopped driving secondary to seizure  Functional Status:  Mobility: Bed Mobility Overal bed mobility: Needs Assistance Bed Mobility: Supine to Sit Supine to sit: Mod assist, +2 for physical assistance, +2 for safety/equipment, HOB elevated Sit to supine: Max assist, +2 for physical assistance General bed mobility comments: modA +2 for stability of trunk and fluid movements of R side hemiplegia. Pt with difficulty following multi step commands Transfers Overall transfer level: Needs assistance Equipment used: 2 person hand held assist Transfers: Sit to/from Stand, Stand Pivot Transfers Sit to Stand: Mod assist, +2 physical assistance, +2 safety/equipment Stand pivot transfers: +2 physical assistance, +2 safety/equipment, Max  assist General transfer comment: modA +2 for stability and to maneuver hemipleigc from bed to recliner. Ambulation/Gait General Gait Details: Did not attempt    ADL: ADL Overall ADL's : Needs assistance/impaired Eating/Feeding: Minimal assistance, Sitting Grooming: Minimal assistance, Sitting Upper Body Bathing: Moderate assistance, Sitting Lower Body Bathing: Maximal assistance, Sit to/from stand, Bed level Upper Body Dressing : Moderate assistance, Sitting, Cueing for compensatory techniques, Cueing for sequencing Lower Body Dressing: Total assistance, Cueing for safety, Sitting/lateral leans, Sit to/from stand Lower Body Dressing Details (indicate cue type and reason): totalA for sock donning Toilet Transfer: Moderate assistance, +2 for physical assistance, +2 for safety/equipment, Stand-pivot Toilet Transfer Details (indicate cue type and reason): R knee blocked pivot from bed to recliner Toileting- Clothing Manipulation and Hygiene: Total assistance Functional mobility during ADLs: Moderate assistance, +2 for physical assistance, +2 for safety/equipment, Cueing for safety, Cueing for sequencing General ADL Comments: Pt severely limited with R sided hemiplegia. Pt reports "I was able to move my R leg before brain surgery yesterday and now I cannot.' Pt limited by decreased strength, decreased ability to care for self and decreased mobility.  Cognition: Cognition Overall Cognitive Status: Impaired/Different from baseline Orientation Level: Oriented X4 Cognition Arousal/Alertness: Awake/alert Behavior During Therapy: WFL for tasks assessed/performed Overall Cognitive Status: Impaired/Different from baseline Area of Impairment: Memory, Problem solving, Awareness, Following commands Memory: Decreased short-term memory Following Commands: Follows multi-step commands inconsistently Awareness: Intellectual Problem Solving: Slow processing, Decreased initiation, Difficulty  sequencing General Comments: Pt with difficulty sequencing with verbal cues with novel tasks. Pt may require multimodal cues for novel tasks. Pt stating "I don't know."   Blood pressure 134/70, pulse 63, temperature 97.7 F (36.5 C), temperature source Oral, resp. rate 14, height '5\' 5"'$  (1.651 m), weight 67.3 kg, SpO2 99 %. Physical Exam Vitals and nursing note reviewed.  Constitutional:      Comments: Sitting on [facility]- with nurse leaving the room; family also stepped out; L crano noted; appropriate, elderly woman, NAD - pretty spry appearing overall.   HENT:     Head:     Comments: Left crani incision with stapled telfa Some dried blood on telfa- cannot see Crani site directly due to stapling; hair covered in betadine- white hair shaved on L side.  Smile pretty equal; facial sensation intact    Right Ear: External ear normal.     Left Ear: External ear normal.     Nose: Nose normal. No congestion.     Mouth/Throat:     Mouth: Mucous membranes are dry.     Pharynx: Oropharynx is clear. No oropharyngeal exudate.  Eyes:     General:        Right eye: No discharge.  Left eye: No discharge.  Cardiovascular:     Rate and Rhythm: Normal rate. Rhythm irregular.     Heart sounds: Normal heart sounds. No murmur heard.   No gallop.  Pulmonary:     Comments: CTA B/L- no W/R/R- good air movement  Abdominal:     Comments: Soft, NT, ND, (+)BS - hypoactive  Genitourinary:    Comments: On BSC- not able to void so far.  Musculoskeletal:     Cervical back: Normal range of motion and neck supple.     Comments: RUE- triceps 1/5, grip 2-/5; otherwise 0/5 in biceps, deltoids, WE, and finger abd LUE 5/5 in same muscles tested RLE- 0/5 in HF, KE, KF, DF and PF LLE- 5/5 in same muscles  Skin:    Comments: Multiple scabbed abrasions bilateral feet. Heels red and non-blanchable.  Trace LE edema to ankles B/L  No skin issues seen on heels- unable to assess buttocks since on [facility]  Neurological:      Mental Status: She is alert.     Comments: Had to think about what had to say, but mild aphasia has improved No signs of seizures today Intact to light touch in RUE/RLE Of note RUE feels somewhat cooler to touch than LUE.   Psychiatric:     Comments: Appropriate, full affect    No results found for this or any previous visit (from the past 48 hour(s)).  MR BRAIN W WO CONTRAST  Result Date: 05/02/2021 CLINICAL DATA:  Follow-up examination status post stereotactic left frontal craniotomy for tumor resection. EXAM: MRI HEAD WITHOUT AND WITH CONTRAST TECHNIQUE: Multiplanar, multiecho pulse sequences of the brain and surrounding structures were obtained without and with intravenous contrast. CONTRAST:  6.42m GADAVIST GADOBUTROL 1 MMOL/ML IV SOLN COMPARISON:  Prior MRI from 03/23/2021. FINDINGS: Brain: Cerebral volume stable, and remains within normal limits. Chronic microvascular ischemic disease with a few scattered remote lacunar infarcts about the left basal ganglia noted. Postoperative changes from interval left frontal craniotomy for tumor resection are seen. Postoperative pneumocephalus seen subjacent to the craniotomy bone flap and overlying the anterior left frontal convexity. Trace extra-axial collection measuring up to 2 mm noted overlying the left temporal occipital convexity. Previously seen left frontal metastasis has been resected. Scattered blood products noted within the resection cavity. Minimal smooth enhancement about the margin of the resection cavity favored to be postoperative. No visible residual tumor. Small focus of restricted diffusion at the deep margin of the resection cavity consistent with a small Peri resection infarct (series 5, image 91). Surrounding vasogenic edema within the adjacent left cerebral hemisphere has mildly worsened as compared to prior. No significant midline shift. No other evidence for acute or subacute infarct. No other mass lesion or abnormal enhancement.  No hydrocephalus. Vascular: Attenuated flow voids within the left M1 segment and distal left MCA branches, consistent with known chronic left MCA occlusion. Hypoplastic right vertebral artery. Major intracranial vascular flow voids are otherwise maintained. Skull and upper cervical spine: Craniocervical junction within normal limits. Bone marrow signal intensity normal. Post craniotomy changes at the left frontal scalp without adverse features. Sinuses/Orbits: Prior ocular lens replacement on the right. Globes and orbital soft tissues demonstrate no acute finding. Paranasal sinuses and mastoid air cells are largely clear. Other: None. IMPRESSION: 1. Postoperative changes from interval left frontal craniotomy for tumor resection. No MRI evidence for residual tumor. 2. Small focus of restricted diffusion at the deep margin of the resection cavity consistent with a small peri resection infarct. 3.  Mild interval worsening in vasogenic edema within the adjacent left cerebral hemisphere. No midline shift. Electronically Signed   By: Jeannine Boga M.D.   On: 05/02/2021 02:59       Medical Problem List and Plan: 1.  R hemiplegia secondary to Metastatic disease from melanoma s/p L frontal crani 05/01/21  -patient may  shower if can cover her crani site appropriately  -ELOS/Goals: 2-3 weeks - goals min A 2.  Antithrombotics: -DVT/anticoagulation:  Mechanical: Sequential compression devices, below knee Bilateral lower extremities--will order surveillance dopplers due to  flaccid R-hemiplegia/Cancer/coagulopathy risk.   -antiplatelet therapy: N/A 3. Pain Management: Tylenol prn.  4. Mood: LCSW to follow for evaluation and support.   -antipsychotic agents: N/A 5. Neuropsych: This patient is capable of making decisions on her own behalf. 6. Skin/Wound Care:  Routine pressure relief measures.  7. Fluids/Electrolytes/Nutrition: Monitor I/O. Check lytes in am.  8. Paroxymal A fib:/HTN: Monitor BP  tid--continue metoprolol daily.   --Off Eliquis due to hemorrhagic  9. Glaucoma: Has been stable on timoptic--will resume.  10. Pre-diabetes: Hgb A1C-6.1. Diet liberalized to regular per patient/family request.   --will monitor FBS weekly  11.  Leucocytosis: Likely due to steroid effect and improving  --monitor for fevers and other signs of infection.  12. Hyponatremia: SIADH? Recheck BMET in am.  13. Urinary retention?: foley d/c this am. Will order PVR checks. - could benefit from Flomax if does not void.     Bary Leriche, PA-C 05/03/2021   I have personally performed a face to face diagnostic evaluation of this patient and formulated the key components of the plan.  Additionally, I have personally reviewed laboratory data, imaging studies, as well as relevant notes and concur with the physician assistant's documentation above.   The patient's status has not changed from the original H&P.  Any changes in documentation from the acute care chart have been noted above.

## 2021-05-03 ENCOUNTER — Inpatient Hospital Stay (HOSPITAL_COMMUNITY)
Admission: RE | Admit: 2021-05-03 | Discharge: 2021-05-29 | DRG: 054 | Disposition: A | Discharge status: Home Health | Payer: Medicare Other | Source: Intra-hospital | Attending: Physical Medicine & Rehabilitation | Admitting: Physical Medicine & Rehabilitation

## 2021-05-03 ENCOUNTER — Other Ambulatory Visit: Payer: Self-pay

## 2021-05-03 ENCOUNTER — Encounter (HOSPITAL_COMMUNITY): Payer: Self-pay | Admitting: Neurosurgery

## 2021-05-03 DIAGNOSIS — Z86010 Personal history of colonic polyps: Secondary | ICD-10-CM | POA: Diagnosis not present

## 2021-05-03 DIAGNOSIS — Y92239 Unspecified place in hospital as the place of occurrence of the external cause: Secondary | ICD-10-CM | POA: Diagnosis present

## 2021-05-03 DIAGNOSIS — R609 Edema, unspecified: Secondary | ICD-10-CM | POA: Diagnosis not present

## 2021-05-03 DIAGNOSIS — M810 Age-related osteoporosis without current pathological fracture: Secondary | ICD-10-CM | POA: Diagnosis present

## 2021-05-03 DIAGNOSIS — Z853 Personal history of malignant neoplasm of breast: Secondary | ICD-10-CM

## 2021-05-03 DIAGNOSIS — I10 Essential (primary) hypertension: Secondary | ICD-10-CM

## 2021-05-03 DIAGNOSIS — Z800 Family history of malignant neoplasm of digestive organs: Secondary | ICD-10-CM | POA: Diagnosis not present

## 2021-05-03 DIAGNOSIS — Z9841 Cataract extraction status, right eye: Secondary | ICD-10-CM

## 2021-05-03 DIAGNOSIS — Z923 Personal history of irradiation: Secondary | ICD-10-CM | POA: Diagnosis not present

## 2021-05-03 DIAGNOSIS — K5900 Constipation, unspecified: Secondary | ICD-10-CM | POA: Diagnosis present

## 2021-05-03 DIAGNOSIS — Z8249 Family history of ischemic heart disease and other diseases of the circulatory system: Secondary | ICD-10-CM

## 2021-05-03 DIAGNOSIS — K5901 Slow transit constipation: Secondary | ICD-10-CM | POA: Diagnosis not present

## 2021-05-03 DIAGNOSIS — Z8051 Family history of malignant neoplasm of kidney: Secondary | ICD-10-CM | POA: Diagnosis not present

## 2021-05-03 DIAGNOSIS — C439 Malignant melanoma of skin, unspecified: Secondary | ICD-10-CM | POA: Diagnosis present

## 2021-05-03 DIAGNOSIS — G936 Cerebral edema: Secondary | ICD-10-CM | POA: Diagnosis present

## 2021-05-03 DIAGNOSIS — S60211A Contusion of right wrist, initial encounter: Secondary | ICD-10-CM | POA: Diagnosis present

## 2021-05-03 DIAGNOSIS — Z801 Family history of malignant neoplasm of trachea, bronchus and lung: Secondary | ICD-10-CM

## 2021-05-03 DIAGNOSIS — I82443 Acute embolism and thrombosis of tibial vein, bilateral: Secondary | ICD-10-CM | POA: Diagnosis present

## 2021-05-03 DIAGNOSIS — F419 Anxiety disorder, unspecified: Secondary | ICD-10-CM | POA: Diagnosis present

## 2021-05-03 DIAGNOSIS — Z9221 Personal history of antineoplastic chemotherapy: Secondary | ICD-10-CM | POA: Diagnosis not present

## 2021-05-03 DIAGNOSIS — T380X5A Adverse effect of glucocorticoids and synthetic analogues, initial encounter: Secondary | ICD-10-CM | POA: Diagnosis present

## 2021-05-03 DIAGNOSIS — G8191 Hemiplegia, unspecified affecting right dominant side: Secondary | ICD-10-CM | POA: Diagnosis present

## 2021-05-03 DIAGNOSIS — D62 Acute posthemorrhagic anemia: Secondary | ICD-10-CM | POA: Diagnosis present

## 2021-05-03 DIAGNOSIS — Z79899 Other long term (current) drug therapy: Secondary | ICD-10-CM

## 2021-05-03 DIAGNOSIS — R488 Other symbolic dysfunctions: Secondary | ICD-10-CM | POA: Diagnosis present

## 2021-05-03 DIAGNOSIS — I480 Paroxysmal atrial fibrillation: Secondary | ICD-10-CM | POA: Diagnosis present

## 2021-05-03 DIAGNOSIS — X58XXXA Exposure to other specified factors, initial encounter: Secondary | ICD-10-CM | POA: Diagnosis not present

## 2021-05-03 DIAGNOSIS — E871 Hypo-osmolality and hyponatremia: Secondary | ICD-10-CM | POA: Diagnosis present

## 2021-05-03 DIAGNOSIS — B9620 Unspecified Escherichia coli [E. coli] as the cause of diseases classified elsewhere: Secondary | ICD-10-CM | POA: Diagnosis not present

## 2021-05-03 DIAGNOSIS — H409 Unspecified glaucoma: Secondary | ICD-10-CM | POA: Diagnosis present

## 2021-05-03 DIAGNOSIS — Z87891 Personal history of nicotine dependence: Secondary | ICD-10-CM

## 2021-05-03 DIAGNOSIS — I82531 Chronic embolism and thrombosis of right popliteal vein: Secondary | ICD-10-CM | POA: Diagnosis present

## 2021-05-03 DIAGNOSIS — G40109 Localization-related (focal) (partial) symptomatic epilepsy and epileptic syndromes with simple partial seizures, not intractable, without status epilepticus: Secondary | ICD-10-CM

## 2021-05-03 DIAGNOSIS — I82453 Acute embolism and thrombosis of peroneal vein, bilateral: Secondary | ICD-10-CM | POA: Diagnosis present

## 2021-05-03 DIAGNOSIS — R339 Retention of urine, unspecified: Secondary | ICD-10-CM

## 2021-05-03 DIAGNOSIS — N390 Urinary tract infection, site not specified: Secondary | ICD-10-CM | POA: Diagnosis not present

## 2021-05-03 DIAGNOSIS — C7931 Secondary malignant neoplasm of brain: Principal | ICD-10-CM | POA: Diagnosis present

## 2021-05-03 DIAGNOSIS — I82409 Acute embolism and thrombosis of unspecified deep veins of unspecified lower extremity: Secondary | ICD-10-CM | POA: Diagnosis not present

## 2021-05-03 DIAGNOSIS — S90822A Blister (nonthermal), left foot, initial encounter: Secondary | ICD-10-CM | POA: Diagnosis not present

## 2021-05-03 DIAGNOSIS — D72829 Elevated white blood cell count, unspecified: Secondary | ICD-10-CM | POA: Diagnosis present

## 2021-05-03 DIAGNOSIS — Z8673 Personal history of transient ischemic attack (TIA), and cerebral infarction without residual deficits: Secondary | ICD-10-CM

## 2021-05-03 DIAGNOSIS — Z803 Family history of malignant neoplasm of breast: Secondary | ICD-10-CM | POA: Diagnosis not present

## 2021-05-03 DIAGNOSIS — D72828 Other elevated white blood cell count: Secondary | ICD-10-CM | POA: Diagnosis not present

## 2021-05-03 DIAGNOSIS — R7303 Prediabetes: Secondary | ICD-10-CM | POA: Diagnosis present

## 2021-05-03 DIAGNOSIS — I82511 Chronic embolism and thrombosis of right femoral vein: Secondary | ICD-10-CM | POA: Diagnosis present

## 2021-05-03 DIAGNOSIS — A499 Bacterial infection, unspecified: Secondary | ICD-10-CM | POA: Diagnosis not present

## 2021-05-03 LAB — SURGICAL PATHOLOGY

## 2021-05-03 MED ORDER — LIDOCAINE HCL URETHRAL/MUCOSAL 2 % EX GEL: CUTANEOUS | Status: DC | PRN

## 2021-05-03 MED ORDER — PROCHLORPERAZINE EDISYLATE 10 MG/2ML IJ SOLN: 5.0000 mg | Freq: Four times a day (QID) | INTRAMUSCULAR | Status: DC | PRN

## 2021-05-03 MED ORDER — ACYCLOVIR 400 MG PO TABS
400.0000 mg | ORAL_TABLET | Freq: Two times a day (BID) | ORAL | Status: DC
  Administered 2021-05-03 – 2021-05-29 (×52): 400 mg via ORAL
  Filled 2021-05-03 (×53): qty 1

## 2021-05-03 MED ORDER — DIPHENHYDRAMINE HCL 12.5 MG/5ML PO ELIX
12.5000 mg | ORAL_SOLUTION | Freq: Four times a day (QID) | ORAL | Status: DC | PRN
  Administered 2021-05-15: 25 mg via ORAL
  Filled 2021-05-03: qty 10

## 2021-05-03 MED ORDER — LEVETIRACETAM 750 MG PO TABS
750.0000 mg | ORAL_TABLET | Freq: Two times a day (BID) | ORAL | Status: DC
  Administered 2021-05-03 – 2021-05-29 (×52): 750 mg via ORAL
  Filled 2021-05-03 (×52): qty 1

## 2021-05-03 MED ORDER — BISACODYL 10 MG RE SUPP: 10.0000 mg | Freq: Every day | RECTAL | Status: DC | PRN

## 2021-05-03 MED ORDER — RALOXIFENE HCL 60 MG PO TABS
60.0000 mg | ORAL_TABLET | Freq: Every day | ORAL | Status: DC
  Administered 2021-05-04 – 2021-05-29 (×26): 60 mg via ORAL
  Filled 2021-05-03 (×26): qty 1

## 2021-05-03 MED ORDER — PROCHLORPERAZINE 25 MG RE SUPP: 12.5000 mg | Freq: Four times a day (QID) | RECTAL | Status: DC | PRN

## 2021-05-03 MED ORDER — ONDANSETRON HCL 4 MG/2ML IJ SOLN: 4.0000 mg | INTRAMUSCULAR | Status: DC | PRN

## 2021-05-03 MED ORDER — PANTOPRAZOLE SODIUM 40 MG PO TBEC
40.0000 mg | DELAYED_RELEASE_TABLET | Freq: Every day | ORAL | Status: DC
  Administered 2021-05-03 – 2021-05-28 (×26): 40 mg via ORAL
  Filled 2021-05-03 (×26): qty 1

## 2021-05-03 MED ORDER — PROCHLORPERAZINE MALEATE 5 MG PO TABS: 5.0000 mg | ORAL_TABLET | Freq: Four times a day (QID) | ORAL | Status: DC | PRN

## 2021-05-03 MED ORDER — TIMOLOL MALEATE 0.5 % OP SOLN
1.0000 [drp] | Freq: Two times a day (BID) | OPHTHALMIC | Status: DC
  Administered 2021-05-03 – 2021-05-29 (×51): 1 [drp] via OPHTHALMIC
  Filled 2021-05-03 (×3): qty 5

## 2021-05-03 MED ORDER — DEXAMETHASONE 4 MG PO TABS
8.0000 mg | ORAL_TABLET | Freq: Two times a day (BID) | ORAL | Status: DC
  Administered 2021-05-03 – 2021-05-04 (×2): 8 mg via ORAL
  Filled 2021-05-03 (×2): qty 2

## 2021-05-03 MED ORDER — ALUM & MAG HYDROXIDE-SIMETH 200-200-20 MG/5ML PO SUSP: 30.0000 mL | ORAL | Status: DC | PRN

## 2021-05-03 MED ORDER — POLYETHYLENE GLYCOL 3350 17 G PO PACK
17.0000 g | PACK | Freq: Every day | ORAL | Status: DC | PRN
Start: 2021-05-03 — End: 2021-05-07
  Administered 2021-05-06: 17 g via ORAL

## 2021-05-03 MED ORDER — GUAIFENESIN-DM 100-10 MG/5ML PO SYRP: 5.0000 mL | ORAL_SOLUTION | Freq: Four times a day (QID) | ORAL | Status: DC | PRN

## 2021-05-03 MED ORDER — OCUVITE-LUTEIN PO CAPS
1.0000 | ORAL_CAPSULE | Freq: Two times a day (BID) | ORAL | Status: DC
  Administered 2021-05-03 – 2021-05-04 (×3): 1 via ORAL
  Filled 2021-05-03 (×5): qty 1

## 2021-05-03 MED ORDER — DEXAMETHASONE 1 MG PO TABS: 2.0000 mg | ORAL_TABLET | ORAL | Status: DC

## 2021-05-03 MED ORDER — ATORVASTATIN CALCIUM 10 MG PO TABS
10.0000 mg | ORAL_TABLET | Freq: Every day | ORAL | Status: DC
  Administered 2021-05-04 – 2021-05-29 (×26): 10 mg via ORAL
  Filled 2021-05-03 (×26): qty 1

## 2021-05-03 MED ORDER — FLEET ENEMA 7-19 GM/118ML RE ENEM: 1.0000 | ENEMA | Freq: Once | RECTAL | Status: DC | PRN

## 2021-05-03 MED ORDER — LEVETIRACETAM 750 MG PO TABS: 750.0000 mg | ORAL_TABLET | Freq: Two times a day (BID) | ORAL | Status: DC

## 2021-05-03 MED ORDER — ONDANSETRON HCL 4 MG PO TABS: 4.0000 mg | ORAL_TABLET | ORAL | Status: DC | PRN

## 2021-05-03 MED ORDER — METOPROLOL SUCCINATE ER 25 MG PO TB24
25.0000 mg | ORAL_TABLET | Freq: Two times a day (BID) | ORAL | Status: DC
  Administered 2021-05-03 – 2021-05-29 (×51): 25 mg via ORAL
  Filled 2021-05-03 (×52): qty 1

## 2021-05-03 MED ORDER — TRAZODONE HCL 50 MG PO TABS: 25.0000 mg | ORAL_TABLET | Freq: Every evening | ORAL | Status: DC | PRN

## 2021-05-03 MED ORDER — ACETAMINOPHEN 325 MG PO TABS: 325.0000 mg | ORAL_TABLET | ORAL | Status: DC | PRN

## 2021-05-03 MED ORDER — SENNOSIDES-DOCUSATE SODIUM 8.6-50 MG PO TABS
1.0000 | ORAL_TABLET | Freq: Two times a day (BID) | ORAL | Status: DC
  Administered 2021-05-03 – 2021-05-29 (×52): 1 via ORAL
  Filled 2021-05-03 (×52): qty 1

## 2021-05-03 MED ORDER — HYDROCODONE-ACETAMINOPHEN 5-325 MG PO TABS: 1.0000 | ORAL_TABLET | ORAL | Status: DC | PRN

## 2021-05-03 NOTE — H&P (Signed)
Physical Medicine and Rehabilitation Admission H&P         Chief Complaint  Patient presents with   Functional deficits due to right hemiplegia      Progression of left frontal lobe metastatic melanoma       HPI: Angela Carpenter is a 80 year old RH female with history of 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 Fib (off Eliquis since 11/2020) who was admitted on 04/23/2021 with 4 week of progression of right-sided weakness to flaccidity with difficulty walking as well as slurred speech.  MRI brain done showing significant interval increase in size of hemorrhagic left frontal lobe metastases with increased surrounding vasogenic edema and local mass-effect.  She was started on IV Decadron with serial CT of head for monitoring per neurology input.  Angela Carpenter was discontinued and case was discussed by   Dr. Alen Blew with multidisciplinary cancer team.  Surgical resection for better disease control was recommended and scheduled for 07/26. Therapy has been ongoing during her stay and she was found to have mild expressive impairments with word finding deficits and paraphasias.  PT/OT has been working on bed mobility and standing attempts in steady. She had generalized seizure activity on 07/24 and Keppra was increased to 750 mg twice daily.  Repeat CT of head was stable without increase in size of hemorrhagic lesion.  EEG done showing intermittent slowing in left temporal region likely secondary to underlying hemorrhage and no seizures. She underwent stereotactic left frontal craniotomy for resection of tumor by Dr. Kathyrn Sheriff on 06/26.  To continue decadron with taper on outpatient basis. Therapy re-evals completed showing ongoing deficits due to right hemiplegia, difficulty following multi step commands as well as difficulty with ADL tasks. CIR recommended due to functional decline.     Pt reports  now has R hemiplegia Needs to pee/poop- foley out this AM- hasn't voided yet. LBM 7/25, but "hasn't eaten much". No real pain- tylenol worked yesterday but nothing needed today.      Review of Systems Constitutional:  Negative for chills and fever. HENT:  Negative for hearing loss and tinnitus.   Eyes:  Negative for blurred vision and double vision. Respiratory:  Negative for cough and shortness of breath.   Cardiovascular:  Negative for chest pain and palpitations. Gastrointestinal:  Positive for constipation. Negative for abdominal pain, heartburn and nausea. Genitourinary:  Negative for dysuria and urgency.       Cannot void- got foley out this AM  Musculoskeletal:  Positive for falls (slid to the floor and was dragging her leg). Negative for back pain, joint pain and myalgias. Skin:  Negative for itching and rash. Neurological:  Positive for speech change and weakness. Negative for dizziness and headaches. Psychiatric/Behavioral:  The patient does not have insomnia.   All other systems reviewed and are negative.         Past Medical History:  Diagnosis Date   A-fib [facility])     Atrial fibrillation ([place])      4 years ago started   Brain cancer ([place]) 12/2020   Breast cancer ([place]) 2006   Colon polyps     Glaucoma     Hypercholesteremia     Hypertension      patient denies   Osteoporosis     Personal history of chemotherapy 2006   Personal history of radiation therapy 2006   Stroke (cerebrum) ([place]) 2015  Past Surgical History:  Procedure Laterality Date   AUGMENTATION MAMMAPLASTY Bilateral 2006    Patient had them removed in 2008   BREAST BIOPSY Left 2016   BREAST LUMPECTOMY Right 2006   COLONOSCOPY        had polyps   EYE SURGERY Right      cataract   MELANOMA EXCISION WITH SENTINEL LYMPH NODE BIOPSY N/A 06/02/2019    Procedure: Left lateral ANAL MELANOMA EXCISION WITH Bilateral Groin exploration and SENTINEL node mapping;  Surgeon: Angela Ruff, MD;   Location: [place];  Service: General;  Laterality: N/A;   TONSILLECTOMY               Family History  Problem Relation Age of Onset   Lung cancer Mother     Hypotension Mother     Pancreatic cancer Father     Hypertension Sister     Kidney cancer Brother     Cirrhosis Maternal Grandfather     Breast cancer Paternal Grandmother     Cirrhosis Paternal Grandfather     Breast cancer Paternal Aunt        Social History: Lives alone and was independent but struggling for few weeks prior to admission.  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 She reports that she smoked briefly in college--never inhaled. She has been exposed to tobacco smoke. She has never used smokeless tobacco. She reports that she does not drink alcohol and does not use drugs.     Allergies: No Known Allergies              Facility-Administered Medications Prior to Admission  Medication Dose Route Frequency Provider Last Rate Last Admin   0.9 %  sodium chloride infusion  500 mL Intravenous Continuous Danis, Estill Cotta III, MD              Medications Prior to Admission  Medication Sig Dispense Refill   acyclovir (ZOVIRAX) 400 MG tablet Take 400 mg by mouth 2 (two) times daily.       atorvastatin (LIPITOR) 10 MG tablet Take 1 tablet (10 mg total) by mouth daily. 90 tablet 0   Cholecalciferol (VITAMIN D-3) 125 MCG (5000 UT) TABS Take 5,000 Units by mouth daily.       levETIRAcetam (KEPPRA) 500 MG tablet Take 1 tablet (500 mg total) by mouth 2 (two) times daily. 60 tablet 0   metoprolol succinate (TOPROL-XL) 25 MG 24 hr tablet Take 1 tablet (25 mg total) by mouth 2 (two) times daily. 180 tablet 0   Multiple Vitamins-Minerals (PRESERVISION AREDS 2+MULTI VIT) CAPS Take 1 capsule by mouth 2 (two) times daily.       raloxifene (EVISTA) 60 MG tablet Take 60 mg by mouth in the morning.       timolol (TIMOPTIC) 0.5 % ophthalmic solution Place 1 drop into both eyes 2 (two) times daily.        [DISCONTINUED] dexamethasone (DECADRON) 1 MG tablet Take 3 tablets (3 mg total) by mouth daily. (Patient taking differently: Take 2-3 mg by mouth See admin instructions. Take 3 mg by mouth in the morning from 04/20/2021-04/24/2021, then, on 04/25/2021, decrease to 2 mg in the morning once daily (if tolerated) until seen again by Dr. Mickeal Skinner) 90 tablet 1      Drug Regimen Review  Drug regimen was reviewed and remains appropriate with no significant issues identified   Home: Home Living Family/patient expects to be discharged to:: Private residence Living Arrangements:  Alone Available Help at Discharge: Available 24 hours/day (son works form home; other family will assist) Type of Home: House Home Access: Stairs to enter [employer] of Steps: 4 Entrance Stairs-Rails: Left, Right, Can reach both Home Layout: Two level, Able to live on main level with bedroom/bathroom Alternate Level Stairs-Number of Steps: flight Bathroom Shower/Tub: Multimedia programmer: Standard Bathroom Accessibility: Yes Home Equipment: Grab bars - tub/shower, Shower seat, Radio producer - single point, Environmental consultant - 2 wheels, Transport chair  Lives With: Alone   Functional History: Prior Function Level of Independence: Independent Comments: I with ADLs/IADLs; recent stopped driving secondary to seizure   Functional Status:  Mobility: Bed Mobility Overal bed mobility: Needs Assistance Bed Mobility: Supine to Sit Supine to sit: Mod assist, +2 for physical assistance, +2 for safety/equipment, HOB elevated Sit to supine: Max assist, +2 for physical assistance General bed mobility comments: modA +2 for stability of trunk and fluid movements of R side hemiplegia. Pt with difficulty following multi step commands Transfers Overall transfer level: Needs assistance Equipment used: 2 person hand held assist Transfers: Sit to/from Stand, Stand Pivot Transfers Sit to Stand: Mod assist, +2 physical assistance, +2  safety/equipment Stand pivot transfers: +2 physical assistance, +2 safety/equipment, Max assist General transfer comment: modA +2 for stability and to maneuver hemipleigc from bed to recliner. Ambulation/Gait General Gait Details: Did not attempt   ADL: ADL Overall ADL's : Needs assistance/impaired Eating/Feeding: Minimal assistance, Sitting Grooming: Minimal assistance, Sitting Upper Body Bathing: Moderate assistance, Sitting Lower Body Bathing: Maximal assistance, Sit to/from stand, Bed level Upper Body Dressing : Moderate assistance, Sitting, Cueing for compensatory techniques, Cueing for sequencing Lower Body Dressing: Total assistance, Cueing for safety, Sitting/lateral leans, Sit to/from stand Lower Body Dressing Details (indicate cue type and reason): totalA for sock donning Toilet Transfer: Moderate assistance, +2 for physical assistance, +2 for safety/equipment, Stand-pivot Toilet Transfer Details (indicate cue type and reason): R knee blocked pivot from bed to recliner Toileting- Clothing Manipulation and Hygiene: Total assistance Functional mobility during ADLs: Moderate assistance, +2 for physical assistance, +2 for safety/equipment, Cueing for safety, Cueing for sequencing General ADL Comments: Pt severely limited with R sided hemiplegia. Pt reports "I was able to move my R leg before brain surgery yesterday and now I cannot.' Pt limited by decreased strength, decreased ability to care for self and decreased mobility.   Cognition: Cognition Overall Cognitive Status: Impaired/Different from baseline Orientation Level: Oriented X4 Cognition Arousal/Alertness: Awake/alert Behavior During Therapy: WFL for tasks assessed/performed Overall Cognitive Status: Impaired/Different from baseline Area of Impairment: Memory, Problem solving, Awareness, Following commands Memory: Decreased short-term memory Following Commands: Follows multi-step commands inconsistently Awareness:  Intellectual Problem Solving: Slow processing, Decreased initiation, Difficulty sequencing General Comments: Pt with difficulty sequencing with verbal cues with novel tasks. Pt may require multimodal cues for novel tasks. Pt stating "I don't know."     Blood pressure 134/70, pulse 63, temperature 97.7 F (36.5 C), temperature source Oral, resp. rate 14, height '5\' 5"'$  (1.651 m), weight 67.3 kg, SpO2 99 %. Physical Exam Vitals and nursing note reviewed. Constitutional:      Comments: Sitting on [facility]- with nurse leaving the room; family also stepped out; L crano noted; appropriate, elderly woman, NAD - pretty spry appearing overall.   HENT:    Head:    Comments: Left crani incision with stapled telfa Some dried blood on telfa- cannot see Crani site directly due to stapling; hair covered in betadine- white hair shaved on L side. Smile pretty  equal; facial sensation intact    Right Ear: External ear normal.    Left Ear: External ear normal.    Nose: Nose normal. No congestion.    Mouth/Throat:    Mouth: Mucous membranes are dry.    Pharynx: Oropharynx is clear. No oropharyngeal exudate. Eyes:    General:        Right eye: No discharge.        Left eye: No discharge. Cardiovascular:    Rate and Rhythm: Normal rate. Rhythm irregular.    Heart sounds: Normal heart sounds. No murmur heard.   No gallop. Pulmonary:    Comments: CTA B/L- no W/R/R- good air movement   Abdominal:    Comments: Soft, NT, ND, (+)BS - hypoactive  Genitourinary:    Comments: On BSC- not able to void so far.  Musculoskeletal:    Cervical back: Normal range of motion and neck supple.    Comments: RUE- triceps 1/5, grip 2-/5; otherwise 0/5 in biceps, deltoids, WE, and finger abd LUE 5/5 in same muscles tested RLE- 0/5 in HF, KE, KF, DF and PF LLE- 5/5 in same muscles  Skin:    Comments: Multiple scabbed abrasions bilateral feet. Heels red and non-blanchable. Trace LE edema to ankles B/L No skin issues seen on  heels- unable to assess buttocks since on [facility]  Neurological:    Mental Status: She is alert.    Comments: Had to think about what had to say, but mild aphasia has improved No signs of seizures today Intact to light touch in RUE/RLE Of note RUE feels somewhat cooler to touch than LUE.   Psychiatric:    Comments: Appropriate, full affect      Lab Results Last 48 Hours  No results found for this or any previous visit (from the past 48 hour(s)).      Imaging Results (Last 48 hours)  MR BRAIN W WO CONTRAST   Result Date: 05/02/2021 CLINICAL DATA:  Follow-up examination status post stereotactic left frontal craniotomy for tumor resection. EXAM: MRI HEAD WITHOUT AND WITH CONTRAST TECHNIQUE: Multiplanar, multiecho pulse sequences of the brain and surrounding structures were obtained without and with intravenous contrast. CONTRAST:  6.65m GADAVIST GADOBUTROL 1 MMOL/ML IV SOLN COMPARISON:  Prior MRI from 03/23/2021. FINDINGS: Brain: Cerebral volume stable, and remains within normal limits. Chronic microvascular ischemic disease with a few scattered remote lacunar infarcts about the left basal ganglia noted. Postoperative changes from interval left frontal craniotomy for tumor resection are seen. Postoperative pneumocephalus seen subjacent to the craniotomy bone flap and overlying the anterior left frontal convexity. Trace extra-axial collection measuring up to 2 mm noted overlying the left temporal occipital convexity. Previously seen left frontal metastasis has been resected. Scattered blood products noted within the resection cavity. Minimal smooth enhancement about the margin of the resection cavity favored to be postoperative. No visible residual tumor. Small focus of restricted diffusion at the deep margin of the resection cavity consistent with a small Peri resection infarct (series 5, image 91). Surrounding vasogenic edema within the adjacent left cerebral hemisphere has mildly worsened as compared  to prior. No significant midline shift. No other evidence for acute or subacute infarct. No other mass lesion or abnormal enhancement. No hydrocephalus. Vascular: Attenuated flow voids within the left M1 segment and distal left MCA branches, consistent with known chronic left MCA occlusion. Hypoplastic right vertebral artery. Major intracranial vascular flow voids are otherwise maintained. Skull and upper cervical spine: Craniocervical junction within normal limits. Bone marrow signal intensity  normal. Post craniotomy changes at the left frontal scalp without adverse features. Sinuses/Orbits: Prior ocular lens replacement on the right. Globes and orbital soft tissues demonstrate no acute finding. Paranasal sinuses and mastoid air cells are largely clear. Other: None. IMPRESSION: 1. Postoperative changes from interval left frontal craniotomy for tumor resection. No MRI evidence for residual tumor. 2. Small focus of restricted diffusion at the deep margin of the resection cavity consistent with a small peri resection infarct. 3. Mild interval worsening in vasogenic edema within the adjacent left cerebral hemisphere. No midline shift. Electronically Signed   By: Jeannine Boga M.D.   On: 05/02/2021 02:59             Medical Problem List and Plan: 1.  R hemiplegia secondary to Metastatic disease from melanoma s/p L frontal crani 05/01/21             -patient may  shower if can cover her crani site appropriately             -ELOS/Goals: 2-3 weeks - goals min A 2.  Antithrombotics: -DVT/anticoagulation:  Mechanical: Sequential compression devices, below knee Bilateral lower extremities--will order surveillance dopplers due to  flaccid R-hemiplegia/Cancer/coagulopathy risk.             -antiplatelet therapy: N/A 3. Pain Management: Tylenol prn.  4. Mood: LCSW to follow for evaluation and support.              -antipsychotic agents: N/A 5. Neuropsych: This patient is capable of making decisions on her  own behalf. 6. Skin/Wound Care:  Routine pressure relief measures.  7. Fluids/Electrolytes/Nutrition: Monitor I/O. Check lytes in am. 8. Paroxymal A fib:/HTN: Monitor BP tid--continue metoprolol daily.             --Off Eliquis due to hemorrhagic 9. Glaucoma: Has been stable on timoptic--will resume.  10. Pre-diabetes: Hgb A1C-6.1. Diet liberalized to regular per patient/family request.             --will monitor FBS weekly 11.  Leucocytosis: Likely due to steroid effect and improving             --monitor for fevers and other signs of infection. 12. Hyponatremia: SIADH? Recheck BMET in am.  13. Urinary retention?: foley d/c this am. Will order PVR checks. - could benefit from Flomax if does not void.        Bary Leriche, PA-C 05/03/2021    I have personally performed a face to face diagnostic evaluation of this patient and formulated the key components of the plan.  Additionally, I have personally reviewed laboratory data, imaging studies, as well as relevant notes and concur with the physician assistant's documentation above.   The patient's status has not changed from the original H&P.  Any changes in documentation from the acute care chart have been noted above.

## 2021-05-03 NOTE — Progress Notes (Deleted)
Courtney Heys, MD   Physician  Physical Medicine and Rehabilitation  PMR Pre-admission     Signed  Date of Service:  05/02/2021  3:08 PM       Related encounter: ED to Hosp-Admission (Discharged) from 04/23/2021 in [facility]       Signed          Show:Clear all '[x]' Written'[x]' Templated'[x]' Copied  Added by: '[x]' Julious Payer Vertis Kelch, RN'[x]' Courtney Heys, MD   '[]' Hover for details                                                                                                                                                                                                                                                                                                                                                                                                                                                     PMR Admission Coordinator Pre-Admission Assessment   Patient: Angela Carpenter is an 80 y.o., female MRN: [id number] DOB: 1941-04-16 Height: '5\' 5"'  (165.1 cm) Weight: 67.3 kg   Insurance Information HMO:     PPO:      PCP:      IPA:      80/20:      OTHER: PRIMARY: Medicare a and b      Policy#: [id number]        pt  Subscriber: pt Benefits:  Phone #: passport one online     Name: 7/27 Eff. Date: 08/07/2008     Deduct: $1556      Out of Pocket Max: none      Life Max: none CIR: 100%      SNF: 20 full days Outpatient: 80%     Co-Pay: 20% Home Health: 100%      Co-Pay: none DME: 80%     Co-Pay: 20% Providers: pt choice  SECONDARY: Tricare for Life      Policy#: [id number]   Financial Counselor:       Phone#:   The "Data Collection Information Summary" for patients in Inpatient Rehabilitation Facilities with attached "Privacy Act [place] Records" was provided and verbally reviewed with: Patient and  Family   Emergency Contact Information Contact Information       Name Relation Home Work Mobile    Pacey,Scott Son     [phone]           Current Medical History  Patient Admitting Diagnosis: frontal lobe metastatic melanoma   History of Present Illness:  80 year old RH female with history of metastatic melanoma to brain with hemorrhagic metastases to left frontal lobe with residual right-sided weakness s/p SRS, breast cancer, A. fib who was admitted on 04/24/2019 with progression of right-sided weakness to flaccidity with difficulty walking as well as slurred speech.  MRI brain done showing significant interval increase in size of hemorrhagic left frontal lobe metastases with increased surrounding vasogenic edema and local mass-effect.  She was started on IV Decadron with serial CT of head for monitoring per neurology input.  Beryle Flock was discontinued and case was discussed with multidisciplinary cancer team.  Surgical resection for better disease control was recommended and scheduled for 07/26. Therapy has been ongoing during her stay and she was found to have mild expressive impairments with word finding deficits and paraphasias.  PT/OT has been working on bed mobility and standing attempts in steady.   She had generalized seizure activity on 07/24 and Keppra was increased to 750 mg twice daily.  Repeat CT of head was stable without increase in size of hemorrhagic lesion.  EEG done showing intermittent slowing in left temporal region likely secondary to underlying hemorrhage and no seizures. She underwent stereotactic left frontal craniotomy for resection of tumor by Dr. Kathyrn Sheriff on 07/26.     Patient's medical record from [facility] has been reviewed by the rehabilitation admission coordinator and physician.   Past Medical History      Past Medical History:  Diagnosis Date   A-fib [facility])     Atrial fibrillation ([place])      4 years ago started   Brain cancer ([place]) 12/2020    Breast cancer ([place]) 2006   Colon polyps     Glaucoma     Hypercholesteremia     Hypertension      patient denies   Osteoporosis     Personal history of chemotherapy 2006   Personal history of radiation therapy 2006   Stroke (cerebrum) ([place]) 2015      Family History   family history includes Breast cancer in her paternal aunt and paternal grandmother; Cirrhosis in her maternal grandfather and paternal grandfather; Hypertension in her sister; Hypotension in her mother; Kidney cancer in her brother; Lung cancer in her mother; Pancreatic cancer in her father.   Prior Rehab/Hospitalizations Has the patient had prior rehab or hospitalizations prior to admission? Yes   Has the  patient had major surgery during 100 days prior to admission? Yes              Current Medications   Current Facility-Administered Medications:    stroke: mapping our early stages of recovery book, , Does not apply, Once, Derek Jack, MD   0.9 %  sodium chloride infusion, , Intravenous, Continuous, Pervis Hocking, DO, Last Rate: 10 mL/hr at 05/01/21 1303, New Bag at 05/01/21 1931   0.9 %  sodium chloride infusion, , Intravenous, Continuous, Consuella Lose, MD, Last Rate: 75 mL/hr at 05/01/21 2055, New Bag at 05/01/21 2055   acetaminophen (TYLENOL) tablet 650 mg, 650 mg, Oral, Q4H PRN, 650 mg at 05/03/21 0014 **OR** acetaminophen (TYLENOL) 160 MG/5ML solution 650 mg, 650 mg, Per Tube, Q4H PRN **OR** acetaminophen (TYLENOL) suppository 650 mg, 650 mg, Rectal, Q4H PRN, Derek Jack, MD   acyclovir (ZOVIRAX) tablet 400 mg, 400 mg, Oral, BID, Shadad, Mathis Dad, MD, 400 mg at 05/03/21 0935   atorvastatin (LIPITOR) tablet 10 mg, 10 mg, Oral, Daily, Derek Jack, MD, 10 mg at 05/03/21 0623   Chlorhexidine Gluconate Cloth 2 % PADS 6 each, 6 each, Topical, Daily, Derek Jack, MD, 6 each at 05/03/21 0936   dexamethasone (DECADRON) tablet 8 mg, 8 mg, Oral, Q12H, Derek Jack, MD, 8 mg at 05/03/21  0934   HYDROcodone-acetaminophen (NORCO/VICODIN) 5-325 MG per tablet 1 tablet, 1 tablet, Oral, Q4H PRN, Consuella Lose, MD, 1 tablet at 05/02/21 0015   labetalol (NORMODYNE) injection 10-40 mg, 10-40 mg, Intravenous, Q10 min PRN, Consuella Lose, MD   levETIRAcetam (KEPPRA) tablet 750 mg, 750 mg, Oral, BID, Consuella Lose, MD, 750 mg at 05/03/21 0935   metoprolol succinate (TOPROL-XL) 24 hr tablet 25 mg, 25 mg, Oral, BID, Derek Jack, MD, 25 mg at 05/03/21 0934   morphine 2 MG/ML injection 1-2 mg, 1-2 mg, Intravenous, Q2H PRN, Consuella Lose, MD   ondansetron (ZOFRAN) tablet 4 mg, 4 mg, Oral, Q4H PRN **OR** ondansetron (ZOFRAN) injection 4 mg, 4 mg, Intravenous, Q4H PRN, Consuella Lose, MD   pantoprazole (PROTONIX) EC tablet 40 mg, 40 mg, Oral, QHS, Henri Medal, RPH, 40 mg at 05/02/21 2128   promethazine (PHENERGAN) tablet 12.5-25 mg, 12.5-25 mg, Oral, Q4H PRN, Consuella Lose, MD   raloxifene (EVISTA) tablet 60 mg, 60 mg, Oral, Daily, Pahwani, Ravi, MD, 60 mg at 05/03/21 0935   senna-docusate (Senokot-S) tablet 1 tablet, 1 tablet, Oral, BID, Derek Jack, MD, 1 tablet at 05/03/21 0935   Patients Current Diet:  Diet Order                  Diet - low sodium heart healthy             Diet Carb Modified Fluid consistency: Thin; Room service appropriate? Yes  Diet effective now                         Precautions / Restrictions Precautions Precautions: Fall Precaution Comments: R hemiparesis Restrictions Weight Bearing Restrictions: No RLE Weight Bearing: Weight bearing as tolerated    Has the patient had 2 or more falls or a fall with injury in the past year? No   Prior Activity Level Limited Community (1-2x/wk): was independent until 2 weeks pta living alone   Prior Functional Level Self Care: Did the patient need help bathing, dressing, using the toilet or eating? Independent   Indoor Mobility: Did the patient need  assistance with walking  from room to room (with or without device)? Independent   Stairs: Did the patient need assistance with internal or external stairs (with or without device)? Independent   Functional Cognition: Did the patient need help planning regular tasks such as shopping or remembering to take medications? Independent   Home Assistive Devices / Equipment Home Assistive Devices/Equipment: [employer] fast streamer Home Equipment: Grab bars - tub/shower, Shower seat, Radio producer - single point, Environmental consultant - 2 wheels, Transport chair   Prior Device Use: Indicate devices/aids used by the patient prior to current illness, exacerbation or injury? None of the above   Current Functional Level Cognition   Overall Cognitive Status: Impaired/Different from baseline Orientation Level: Oriented X4 Following Commands: Follows multi-step commands inconsistently General Comments: Pt with difficulty sequencing with verbal cues with novel tasks. Pt may require multimodal cues for novel tasks. Pt stating "I don't know."    Extremity Assessment (includes Sensation/Coordination)   Upper Extremity Assessment: RUE deficits/detail, LUE deficits/detail RUE Deficits / Details: R side hemiplegia; thumb activation flex and 3/5 hand squeeze x2 times. fatigues easily; otherwise, PROM, WFL RUE Sensation: WNL RUE Coordination: decreased fine motor, decreased gross motor LUE Deficits / Details: strength 5/5. ROM, WNLs  Lower Extremity Assessment: RLE deficits/detail RLE Deficits / Details: sensation intact; no knee buckling in standing; no AROM noted     ADLs   Overall ADL's : Needs assistance/impaired Eating/Feeding: Minimal assistance, Sitting Grooming: Minimal assistance, Sitting Upper Body Bathing: Moderate assistance, Sitting Lower Body Bathing: Maximal assistance, Sit to/from stand, Bed level Upper Body Dressing : Moderate assistance, Sitting, Cueing for compensatory techniques, Cueing for sequencing Lower Body Dressing: Total assistance, Cueing  for safety, Sitting/lateral leans, Sit to/from stand Lower Body Dressing Details (indicate cue type and reason): totalA for sock donning Toilet Transfer: Moderate assistance, +2 for physical assistance, +2 for safety/equipment, Stand-pivot Toilet Transfer Details (indicate cue type and reason): R knee blocked pivot from bed to recliner Toileting- Clothing Manipulation and Hygiene: Total assistance Functional mobility during ADLs: Moderate assistance, +2 for physical assistance, +2 for safety/equipment, Cueing for safety, Cueing for sequencing General ADL Comments: Pt severely limited with R sided hemiplegia. Pt reports "I was able to move my R leg before brain surgery yesterday and now I cannot.' Pt limited by decreased strength, decreased ability to care for self and decreased mobility.     Mobility   Overal bed mobility: Needs Assistance Bed Mobility: Supine to Sit Supine to sit: Mod assist, +2 for physical assistance, +2 for safety/equipment, HOB elevated Sit to supine: Max assist, +2 for physical assistance General bed mobility comments: modA +2 for stability of trunk and fluid movements of R side hemiplegia. Pt with difficulty following multi step commands     Transfers   Overall transfer level: Needs assistance Equipment used: 2 person hand held assist Transfers: Sit to/from Stand, Stand Pivot Transfers Sit to Stand: Mod assist, +2 physical assistance, +2 safety/equipment Stand pivot transfers: +2 physical assistance, +2 safety/equipment, Max assist General transfer comment: modA +2 for stability and to maneuver hemipleigc from bed to recliner.     Ambulation / Gait / Stairs / Wheelchair Mobility   Ambulation/Gait General Gait Details: Did not attempt     Posture / Balance Dynamic Sitting Balance Sitting balance - Comments: [email or web address] Balance Overall balance assessment: Needs assistance Sitting-balance support: Feet supported, No upper extremity supported Sitting balance-Leahy  Scale: Fair Sitting balance - Comments: [email or web address] Postural control: Posterior lean Standing balance support: Bilateral upper extremity supported (RUE supported  by therapist) Standing balance-Leahy Scale: Poor Standing balance comment: standing at EOB with R knee intermittently blocked.     Special needs/care consideration Seizure precautions    Previous Home Environment  Living Arrangements: Alone  Lives With: Alone Available Help at Discharge: Available 24 hours/day (son works form home; other family will assist) Type of Home: House Home Layout: Two level, Able to live on main level with bedroom/bathroom Alternate Level Stairs-Number of Steps: flight Home Access: Stairs to enter Entrance Stairs-Rails: Left, Right, Can reach both Entrance Stairs-Number of Steps: 4 Bathroom Shower/Tub: Multimedia programmer: Programmer, systems: Yes [place]: Yes   Discharge Living Setting Plans for Discharge Living Setting: Patient's home (son and family will provide 24/7 assist) Type of Home at Discharge: House Discharge Home Layout: Able to live on main level with bedroom/bathroom, Two level Alternate Level Stairs-Number of Steps: flight Discharge Home Access: Stairs to enter Entrance Stairs-Rails: Right, Left, Can reach both Entrance Stairs-Number of Steps: 4 Discharge Bathroom Shower/Tub: Walk-in shower Discharge Bathroom Toilet: Standard Discharge Bathroom Accessibility: Yes How Accessible: Accessible via walker Does the patient have any problems obtaining your medications?: No   Social/Family/Support Systems Contact Information: son, Event organiser Anticipated Caregiver: Event organiser, brother and sister in law and sister Anticipated Caregiver's Contact Information: see above Ability/Limitations of Caregiver: son works from home Caregiver Availability: 24/7 Discharge Plan Discussed with Primary Caregiver: Yes Is Caregiver In Agreement with Plan?: Yes Does  Caregiver/Family have Issues with Lodging/Transportation while Pt is in Rehab?: No   Goals Patient/Family Goal for Rehab: min assist with PT and OT, supervision with SLP Expected length of stay: ELOS 2 to 3 weeks Pt/Family Agrees to Admission and willing to participate: Yes Program Orientation Provided & Reviewed with Pt/Caregiver Including Roles  & Responsibilities: Yes   Decrease burden of Care through IP rehab admission: n/a   Possible need for SNF placement upon discharge: not anticipated   Patient Condition: I have reviewed medical records from [facility], spoken with CM, and patient, son, and family member. I met with patient at the bedside for inpatient rehabilitation assessment.  Patient will benefit from ongoing PT and OT, can actively participate in 3 hours of therapy a day 5 days of the week, and can make measurable gains during the admission.  Patient will also benefit from the coordinated team approach during an Inpatient Acute Rehabilitation admission.  The patient will receive intensive therapy as well as Rehabilitation physician, nursing, social worker, and care management interventions.  Due to bladder management, bowel management, safety, skin/wound care, disease management, medication administration, pain management, and patient education the patient requires 24 hour a day rehabilitation nursing.  The patient is currently mod to max assist overall with mobility and basic ADLs.  Discharge setting and therapy post discharge at home with home health is anticipated.  Patient has agreed to participate in the Acute Inpatient Rehabilitation Program and will admit today.   Preadmission Screen Completed By:  Cleatrice Burke, 05/03/2021 11:57 AM ______________________________________________________________________   Discussed status with Dr. Dagoberto Ligas on  05/03/2021 at 1157 and received approval for admission today.   Admission Coordinator:  Cleatrice Burke, RN, time  0175 Date 05/03/2021    Assessment/Plan: Diagnosis: Does the need for close, 24 hr/day Medical supervision in concert with the patient's rehab needs make it unreasonable for this patient to be served in a less intensive setting? Yes Co-Morbidities requiring supervision/potential complications: R hemiplegia due to worsening L frontal lobe mets/hemorrhage- s/p crani with seizures  and aphasia Due to bladder management, bowel management, safety, skin/wound care, disease management, medication administration, pain management, and patient education, does the patient require 24 hr/day rehab nursing? Yes Does the patient require coordinated care of a physician, rehab nurse, PT, OT, and SLP to address physical and functional deficits in the context of the above medical diagnosis(es)? Yes Addressing deficits in the following areas: balance, endurance, locomotion, strength, transferring, bowel/bladder control, bathing, dressing, feeding, grooming, toileting, cognition, speech, and language Can the patient actively participate in an intensive therapy program of at least 3 hrs of therapy 5 days a week? Yes The potential for patient to make measurable gains while on inpatient rehab is fair Anticipated functional outcomes upon discharge from inpatient rehab: min assist PT, min assist OT, supervision and min assist SLP Estimated rehab length of stay to reach the above functional goals is: 2-3 weeks Anticipated discharge destination: Home 10. Overall Rehab/Functional Prognosis: fair     MD Signature:            Revision History                                       Note Details  Jan Fireman, MD File Time 05/03/2021 12:17 PM  Author Type Physician Status Signed  Last Editor Courtney Heys, MD Service Physical Medicine and [place] # [id number] Admit Date 05/03/2021

## 2021-05-03 NOTE — Progress Notes (Addendum)
Foley Removed; Due to Void by 2pm

## 2021-05-03 NOTE — Progress Notes (Signed)
Patient arrived to room [phone]. Patient educated and oriented to unit. VSS. Family at bedside. All questions answered.

## 2021-05-03 NOTE — Progress Notes (Signed)
  NEUROSURGERY PROGRESS NOTE   No issues overnight. Pt uncomfortable this am, nothing focal.  EXAM:  BP 124/63 (BP Location: Right Arm)   Pulse 65   Temp 98.3 F (36.8 C) (Oral)   Resp 14   Ht _0  (1.651 m)   Wt 67.3 kg   SpO2 99%   BMI 24.69 kg/m   Awake, alert, oriented  Speech fluent, appropriate  CN grossly intact  5/5 LUE/LLE 0/5 RUE/RLE Wound c/d/I  IMPRESSION:  80 y.o. female POD#2 s/p resection of left frontal melanoma met, at baseline with right hemiplegia.  PLAN: - Stable for d/c to CIR from neurosurgical standpoint - Cont Keppra 727m PO BID - Dex taper - d/c staples in 2 weeks, can remove telfa dressing today   NConsuella Lose MD [facility] Center[facility]

## 2021-05-03 NOTE — Progress Notes (Signed)
Inpatient Rehabilitation Medication Review by a Pharmacist  A complete drug regimen review was completed for this patient to identify any potential clinically significant medication issues.  Clinically significant medication issues were identified:  yes   Type of Medication Issue Identified Description of Issue Urgent (address now) Non-Urgent (address on AM team rounds) Plan Plan Accepted by Provider? (Yes / No / Pending AM Rounds)  Drug Interaction(s) (clinically significant)  N/A     Duplicate Therapy  N/A     Allergy  N/A     No Medication Administration End Date  Decadron taper not entered as per discharge orders Non-urgent Address in AM rounds   Incorrect Dose  Decadron taper not entered as per discharge orders Non-urgent Address in AM rounds   Additional Drug Therapy Needed  Vit D as per discharge orders/home med Non-urgent Address in AM rounds   Other  N/A       Name of provider notified for urgent issues identified:   Provider Method of Notification:    For non-urgent medication issues to be resolved on team rounds tomorrow morning a [facility] Secure Chat Handoff was sent to:    Pharmacist comments:   Time spent performing this drug regimen review (minutes):  15   Ramond Craver 05/03/2021 5:24 PM

## 2021-05-03 NOTE — Care Management Important Message (Signed)
Important Message  Patient Details  Name: Angela Carpenter MRN: [id number] Date of Birth: Jan 01, 1941   Medicare Important Message Given:  Yes     Mihaela Fajardo P Creighton 05/03/2021, 11:06 AM

## 2021-05-03 NOTE — Progress Notes (Signed)
Inpatient Rehabilitation  Patient information reviewed and entered into eRehab system by Avari Nevares M. Lennard Capek, M.A., CCC/SLP, PPS Coordinator.  Information including medical coding, functional ability and quality indicators will be reviewed and updated through discharge.    

## 2021-05-03 NOTE — Progress Notes (Signed)
  Inpatient Rehabilitation Admissions Coordinator  I contacted Dr. Kathyrn Sheriff by phone to clarify if patient felt medically ready to d/c to CIR and he has confirmed that surgically she is ready . I will contact acute team, TOC and Dr. Gaspar Cola to clarify if she can d/c to CIR today.  Danne Baxter, RN, MSN Rehab Admissions Coordinator [phone] 05/03/2021 10:12 AM

## 2021-05-03 NOTE — Progress Notes (Signed)
Inpatient Rehabilitation Admissions Coordinator   I met at bedside with patient and her sister in law. SH is in agreement to admit to CIR today. I will make the arrangements.  Danne Baxter, RN, MSN Rehab Admissions Coordinator [phone] 05/03/2021 11:56 AM

## 2021-05-03 NOTE — Progress Notes (Signed)
Physical Therapy Treatment Patient Details Name: Angela Carpenter MRN: [id number] DOB: 07/12/1941 Today's Date: 05/03/2021    History of Present Illness Pt is a 80 y/o female presenting to ED with worsening R sided weakness in setting of marked expansion of L frotnal hemorrhagic met with surrounding vasogenic edema on 04/23/21. Patient now completely flaccid in RUE/RLE with dysarthria. Pt s/p L frontal craniotomy for resection of metastatic hemorrhagic tumor on 05/01/21. 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    PT Comments    Pt reports having a bad night of no sleep but willing to try therapy.  Pt with good participation and following commands today.  She continues to be flaccid on R side but did demonstrate improved transfers and tolerated increased time in standing and working on weight shifting.  Needs tactile cues for posture and balance.  Continue to advance as able.     Follow Up Recommendations  CIR     Equipment Recommendations  Wheelchair (measurements PT);Wheelchair cushion (measurements PT);3in1 (PT)    Recommendations for Other Services Rehab consult     Precautions / Restrictions Precautions Precautions: Fall Precaution Comments: R hemiparesis Required Braces or Orthoses: Sling (Sling on R UE for OOB activity to prevent dislocation)    Mobility  Bed Mobility Overal bed mobility: Needs Assistance Bed Mobility: Supine to Sit;Sit to Supine     Supine to sit: Mod assist Sit to supine: Mod assist;+2 for physical assistance   General bed mobility comments: Requiring assist for R side and to lift trunk to sit.    Transfers Overall transfer level: Needs assistance   Transfers: Sit to/from Stand Sit to Stand: Mod assist;+2 safety/equipment         General transfer comment: 1 x sit to  stand from bed and x 8 from STEDY with light mod A of 2 for safety.  Pt with heavy reliance on L UE to pull up.  Progressed to transfers from STEDY with pt pushing up with L hand vs pulling with cues  Ambulation/Gait             General Gait Details: unable   Stairs             Wheelchair Mobility    Modified Rankin (Stroke Patients Only) Modified Rankin (Stroke Patients Only) Pre-Morbid Rankin Score: Slight disability Modified Rankin: Severe disability     Balance   Sitting-balance support: Feet supported;No upper extremity supported Sitting balance-Leahy Scale: Fair Sitting balance - Comments: Could sit at EOB with supervision.  Worked on weight shifting/leaning onto R elbow with min A to support R arm and pt able to return to midline with min A.  10 reps onto R arm with 10 sec hold   Standing balance support: Single extremity supported Standing balance-Leahy Scale: Poor Standing balance comment: Pt stood in STEDY multiple times for as long as 5 mins at at time.  She heavily relied on L UE for balance.  R UE in sling to protect shoulder.  Worked on weight shifting onto R leg with cues and tactile assist at knee and posterior thigh to straighten knee and verbal cues to tuck buttock. Performed >30 reps.   Also, performed mini-squats x10 again with support at R knee and thigh by therapist and tech holding gait belt. With mini squats - pt tending to "fall" backward   using L UE to control - provided tactile cues at hips to flex and shoulders to lean forward for mini squat.                            Cognition Arousal/Alertness: Awake/alert Behavior During Therapy: WFL for tasks assessed/performed Overall Cognitive Status: Impaired/Different from baseline Area of Impairment: Problem solving;Memory                     Memory: Decreased short-term memory       Problem Solving: Slow processing General Comments: Pt following basic commands toda       Exercises General Exercises - Lower Extremity Ankle Circles/Pumps: PROM;Right;10 reps;Supine Heel Slides: PROM;Right;10 reps;Supine Hip ABduction/ADduction: PROM;Right;10 reps;Supine Other Exercises Other Exercises: R hip IR/ER x 10 PROM; R heel cord stretch 3 reps 30 sec    General Comments General comments (skin integrity, edema, etc.): Educated on R UE safety (not letting anyone pullon R UE) and sling when OOB to prevent dislocation/subluxation.  Encouraged to rest with arm straight and elevated at rest to prevent contracture.s.  Also, educated pt and family on safe PROM for UE and R LE and R PF stretch.      Pertinent Vitals/Pain Pain Assessment: No/denies pain    Home Living                      Prior Function            PT Goals (current goals can now be found in the care plan section) Acute Rehab PT Goals Patient Stated Goal: To go to CIR PT Goal Formulation: With patient Time For Goal Achievement: 05/16/21 Potential to Achieve Goals: Good Progress towards PT goals: Progressing toward goals    Frequency    Min 4X/week      PT Plan Current plan remains appropriate    Co-evaluation              AM-PAC PT "6 Clicks" Mobility   Outcome Measure  Help needed turning from your back to your side while in a flat bed without using bedrails?: A Lot Help needed moving from lying on your back to sitting on the side of a flat bed without using bedrails?: A Lot Help needed moving to and from a bed to a chair (including a wheelchair)?: Total Help needed standing up from a chair using your arms (e.g., wheelchair or bedside chair)?: Total Help needed to walk in hospital room?: Total Help needed climbing 3-5 steps with a railing? : Total 6 Click Score: 8    End of Session Equipment Utilized During Treatment: Gait belt Activity Tolerance: Patient tolerated treatment well Patient left: with call bell/phone within reach;in bed;with bed alarm set Nurse  Communication: Mobility status PT Visit Diagnosis: Unsteadiness on feet (R26.81);Other abnormalities of gait and mobility (R26.89);Hemiplegia and hemiparesis Hemiplegia - Right/Left: Right Hemiplegia - dominant/non-dominant: Dominant Hemiplegia - caused by: Other Nontraumatic intracranial hemorrhage     Time: 1610-9604 PT Time Calculation (min) (ACUTE ONLY): 34 min  Charges:  $Therapeutic Activity: 8-22 mins $Neuromuscular Re-education: 8-22 mins                     Abran Richard, PT Acute Rehab Services Pager [phone] [facility] [place] 05/03/2021, 1:07 PM

## 2021-05-03 NOTE — Consult Note (Signed)
   [facility] Inpatient Consult   05/03/2021  Angela Carpenter Sep 21, 1941 [id number]  [employer] [[facility]] Patient: Medicare DCE  LLOS:  review for progress and transitional plans  Primary Care Provider: Tisovec, [name], MD [facility], this provider is listed for the transition of care follow up   Patient screened for post hospitalization needs and found that patient is being recommended for an inpatient rehab level of care for transition.   Review of patient's medical record reveals patient is for transition to [facility] when medically ready.   Plan:  Continue to follow progress periodically for progress in rehab and disposition recommendations when appropriate.  For questions contact:   Natividad Brood, RN BSN [facility] Liaison  [phone] business mobile phone Toll free office [phone]  Fax number: [phone] [email or web address]

## 2021-05-03 NOTE — Progress Notes (Signed)
Patient ID: SHATONA ANDUJAR, female   DOB: 06-Sep-1941, 80 y.o.   MRN: [id number]  Met with patient and family in patient's room. RN and LPN were in room doing assessment and skin assessment. Explained rehab and rehab process to patient and family. Patient mentioned that she would like to start back on her eye drops for her glaucoma and the vitamins as well. Told them I would message Algis Liming, PA-C. The family stated that there would be a total of 3 visitors rotating in to stay with her but only 2 per day. I mentioned that I would let Dr. Naaman Plummer know so that I could make the information desk aware. I will continue to follow this patient's progress.  Dorthula Nettles, RN3, BSN, CBIS, [place], [facility], Inpatient Rehabilitation Office [phone] Cell [phone]

## 2021-05-03 NOTE — TOC Transition Note (Signed)
Transition of Care [facility]) - CM/SW Discharge Note   Patient Details  Name: Angela Carpenter MRN: [id number] Date of Birth: 1941-03-12  Transition of Care [facility]) CM/SW Contact:  Pollie Friar, RN Phone Number: 05/03/2021, 11:48 AM   Clinical Narrative:    Pt discharging to CIR today. CM signing off.   Final next level of care: IP Rehab Facility Barriers to Discharge: No Barriers Identified   Patient Goals and CMS Choice        Discharge Placement                       Discharge Plan and Services                                     Social Determinants of Health (SDOH) Interventions     Readmission Risk Interventions No flowsheet data found.

## 2021-05-03 NOTE — Discharge Summary (Addendum)
Physician Discharge Summary  KATURA EATHERLY EYC:[id number] DOB: 08-Jun-1941 DOA: 04/23/2021  PCP: Haywood Pao, MD  Admit date: 04/23/2021 Discharge date: 05/03/2021  Admitted From: Home Disposition:  CIR  Recommendations for Outpatient Follow-up:  Follow up with PCP in 1-2 weeks Please obtain BMP/CBC in one week   Home Health:No Equipment/Devices:None  Discharge Condition:Guarded CODE STATUS:DNR Diet recommendation: Heart Healthy   Brief/Interim Summary:  80 y.o. female past medical history of metastatic melanoma with known brain hemorrhagic left frontal lobe bleed status post SRS, breast cancer A. fib not on anticoagulation admitted due to right side worsening weakness in the setting of marked expansion of hemorrhagic met initially admitted under neurology service who consulted neuro oncologist and neurosurgery transferred to the hospitalist service she was on Decadron and Keppra, neurosurgery recommended surgical intervention which was done on 05/01/2021.  Discharge Diagnoses:  Active Problems:   ICH (intracerebral hemorrhage) (HCC)   Metastasis to brain ([place])   Hemiparesis ([place])  Intracranial hemorrhage due to metastatic disease primary melanoma: Neurosurgery was consulted and she was status post surgical resection on 05/01/2021. Neurosurgery recommended to continue Decadron and continue as an outpatient with taper and increase her Keppra which she will continue as an outpatient. Monitor UOP closely.  Seizure-like activity: On 04/29/2021 she had a 2-minute seizure activity CT scan of the head was done that showed unchanged in size of intraparenchymal hemorrhage her Keppra was increased.  Hyperlipidemia Continue statins.   Discharge Instructions  Discharge Instructions     Diet - low sodium heart healthy   Complete by: As directed    Increase activity slowly   Complete by: As directed    No wound care   Complete by: As directed       Allergies as of 05/03/2021    No Known Allergies      Medication List     TAKE these medications    acyclovir 400 MG tablet Commonly known as: ZOVIRAX Take 400 mg by mouth 2 (two) times daily.   atorvastatin 10 MG tablet Commonly known as: LIPITOR Take 1 tablet (10 mg total) by mouth daily. What changed: when to take this   dexamethasone 1 MG tablet Commonly known as: DECADRON Take 2 tablets (2 mg total) by mouth See admin instructions. Take 8 mg by mouth in the morning from 05/03/2021-05/07/2021, then, on 05/07/2021, decrease to 4 mg in the morning once daily until 05/11/2021, then 2 mg daily until you see Dr. Mickeal Skinner What changed:  how much to take when to take this additional instructions   levETIRAcetam 750 MG tablet Commonly known as: KEPPRA Take 1 tablet (750 mg total) by mouth 2 (two) times daily. What changed:  medication strength how much to take   metoprolol succinate 25 MG 24 hr tablet Commonly known as: TOPROL-XL Take 1 tablet (25 mg total) by mouth 2 (two) times daily.   PreserVision AREDS 2+Multi Vit Caps Take 1 capsule by mouth 2 (two) times daily.   raloxifene 60 MG tablet Commonly known as: EVISTA Take 60 mg by mouth in the morning.   timolol 0.5 % ophthalmic solution Commonly known as: TIMOPTIC Place 1 drop into both eyes 2 (two) times daily.   Vitamin D-3 125 MCG (5000 UT) Tabs Take 5,000 Units by mouth daily.        No Known Allergies  Consultations: Neurosurgery   Procedures/Studies: CT HEAD WO CONTRAST  Result Date: 04/30/2021 CLINICAL DATA:  Seizure.  Hemorrhage follow-up EXAM: CT HEAD WITHOUT CONTRAST TECHNIQUE: Contiguous  axial images were obtained from the base of the skull through the vertex without intravenous contrast. COMPARISON:  04/24/2021 FINDINGS: Brain: Unchanged size of intraparenchymal hematoma in the superior left hemisphere with surrounding edema. No new site of hemorrhage. No midline shift or herniation. Vascular: No hyperdense vessel or  unexpected calcification. Skull: Normal. Negative for fracture or focal lesion. Sinuses/Orbits: No acute finding. Other: None. IMPRESSION: Unchanged size of intraparenchymal hematoma in the superior left hemisphere with surrounding edema. Electronically Signed   By: Ulyses Jarred M.D.   On: 04/30/2021 00:16   CT HEAD WO CONTRAST  Result Date: 04/24/2021 CLINICAL DATA:  Hemorrhage follow-up EXAM: CT HEAD WITHOUT CONTRAST TECHNIQUE: Contiguous axial images were obtained from the base of the skull through the vertex without intravenous contrast. COMPARISON:  04/23/2021 FINDINGS: Brain: Unchanged size of intraparenchymal hematoma centered in the superior left frontal lobe with surrounding vasogenic edema. No midline shift or other mass effect. Old infarct of the left caudate head. Vascular: No abnormal hyperdensity of the major intracranial arteries or dural venous sinuses. No intracranial atherosclerosis. Skull: The visualized skull base, calvarium and extracranial soft tissues are normal. Sinuses/Orbits: No fluid levels or advanced mucosal thickening of the visualized paranasal sinuses. No mastoid or middle ear effusion. The orbits are normal. IMPRESSION: Unchanged size of intraparenchymal hematoma centered in the superior left frontal lobe with surrounding vasogenic edema. Electronically Signed   By: Ulyses Jarred M.D.   On: 04/24/2021 02:59   CT HEAD WO CONTRAST  Result Date: 04/23/2021 CLINICAL DATA:  Follow-up parenchymal hemorrhage. EXAM: CT HEAD WITHOUT CONTRAST TECHNIQUE: Contiguous axial images were obtained from the base of the skull through the vertex without intravenous contrast. COMPARISON:  Head CT and brain MRI earlier today. FINDINGS: Brain: No significant change in the hemorrhagic left frontal lobe lesion measuring 2.9 x 2.3 cm. Stable degree of surrounding vasogenic edema with localized mass effect, including the frontal horn of the left lateral ventricle. There is otherwise no significant  change from exams earlier today. No evidence of new lesion or hemorrhage. No subdural collection. No hydrocephalus. Vascular: No hyperdense vessel. Skull: No fracture or focal lesion. Sinuses/Orbits: Stable lung mucosal thickening and opacification of scattered left ethmoid air cells. Right cataract resection. Other: No mastoid effusion. IMPRESSION: 1. No significant change in hemorrhagic left frontal lobe lesion with surrounding vasogenic edema and localized mass effect. 2. No new abnormality from exams earlier today. Electronically Signed   By: Keith Rake M.D.   On: 04/23/2021 19:17   CT Head Wo Contrast  Result Date: 04/23/2021 CLINICAL DATA:  Right-sided weakness progressively worsening over the last 2 weeks. Personal history of melanoma metastatic to the brain. EXAM: CT HEAD WITHOUT CONTRAST TECHNIQUE: Contiguous axial images were obtained from the base of the skull through the vertex without intravenous contrast. COMPARISON:  Multiple exams, including CT head 12/31/2020 and MRI brain 03/23/2021. FINDINGS: Brain: Left frontal lobe metastatic lesion near the vertex measures 2.9 by 2.3 cm on image 25 series 4, previously 2.4 by 1.2 cm on 12/31/2020 and previously 1.6 by 1.6 cm on 03/23/2021. The lesion continues to be high in density, as on 12/31/2020, probably reflecting some associated petechial hemorrhage. Substantial surrounding vasogenic edema. Mild effacement of adjacent sulci. Small remote infarct in the left frontal periventricular white matter. Periventricular white matter and corona radiata hypodensities favor chronic ischemic microvascular white matter disease. Dilated perivascular space or remote small lacunar infarct posteriorly along the left lentiform nucleus margin. I not see any new metastatic lesions. Vascular:  Unremarkable Skull: Unremarkable Sinuses/Orbits: Chronic sinusitis posteriorly in the left ethmoid air cells. Other: No supplemental non-categorized findings. IMPRESSION: 1.  Enlarging dense left frontal lobe mass at the vertex, currently 2.9 by 2.3 cm, most compatible with enlarging metastatic lesion. Worsening surrounding vasogenic edema. 2. Mild chronic left ethmoid sinusitis. 3. Periventricular white matter and corona radiata hypodensities favor chronic ischemic microvascular white matter disease. Small remote lacunar infarcts in the left periventricular white matter and along the posterior margin of the left lentiform nucleus. Electronically Signed   By: Van Clines M.D.   On: 04/23/2021 12:15   MR BRAIN W WO CONTRAST  Result Date: 05/02/2021 CLINICAL DATA:  Follow-up examination status post stereotactic left frontal craniotomy for tumor resection. EXAM: MRI HEAD WITHOUT AND WITH CONTRAST TECHNIQUE: Multiplanar, multiecho pulse sequences of the brain and surrounding structures were obtained without and with intravenous contrast. CONTRAST:  6.51m GADAVIST GADOBUTROL 1 MMOL/ML IV SOLN COMPARISON:  Prior MRI from 03/23/2021. FINDINGS: Brain: Cerebral volume stable, and remains within normal limits. Chronic microvascular ischemic disease with a few scattered remote lacunar infarcts about the left basal ganglia noted. Postoperative changes from interval left frontal craniotomy for tumor resection are seen. Postoperative pneumocephalus seen subjacent to the craniotomy bone flap and overlying the anterior left frontal convexity. Trace extra-axial collection measuring up to 2 mm noted overlying the left temporal occipital convexity. Previously seen left frontal metastasis has been resected. Scattered blood products noted within the resection cavity. Minimal smooth enhancement about the margin of the resection cavity favored to be postoperative. No visible residual tumor. Small focus of restricted diffusion at the deep margin of the resection cavity consistent with a small Peri resection infarct (series 5, image 91). Surrounding vasogenic edema within the adjacent left cerebral  hemisphere has mildly worsened as compared to prior. No significant midline shift. No other evidence for acute or subacute infarct. No other mass lesion or abnormal enhancement. No hydrocephalus. Vascular: Attenuated flow voids within the left M1 segment and distal left MCA branches, consistent with known chronic left MCA occlusion. Hypoplastic right vertebral artery. Major intracranial vascular flow voids are otherwise maintained. Skull and upper cervical spine: Craniocervical junction within normal limits. Bone marrow signal intensity normal. Post craniotomy changes at the left frontal scalp without adverse features. Sinuses/Orbits: Prior ocular lens replacement on the right. Globes and orbital soft tissues demonstrate no acute finding. Paranasal sinuses and mastoid air cells are largely clear. Other: None. IMPRESSION: 1. Postoperative changes from interval left frontal craniotomy for tumor resection. No MRI evidence for residual tumor. 2. Small focus of restricted diffusion at the deep margin of the resection cavity consistent with a small peri resection infarct. 3. Mild interval worsening in vasogenic edema within the adjacent left cerebral hemisphere. No midline shift. Electronically Signed   By: BJeannine BogaM.D.   On: 05/02/2021 02:59   MR BRAIN W WO CONTRAST  Result Date: 04/23/2021 CLINICAL DATA:  Brain/CNS surveillance. EXAM: MRI HEAD WITHOUT AND WITH CONTRAST TECHNIQUE: Multiplanar, multiecho pulse sequences of the brain and surrounding structures were obtained without and with intravenous contrast. CONTRAST:  621mGADAVIST GADOBUTROL 1 MMOL/ML IV SOLN COMPARISON:  MRI March 23, 2021. FINDINGS: Brain: Significant interval increase in size of the superior left frontal intraparenchymal metastasis, which now measures 3.1 x 2.6 x 3.0 cm (previously 1.5 x 1.4 x 1.8 cm when remeasured). A portion of the lesion is intrinsically T1 hyperintense, compatible with hemorrhage. The lesion demonstrates  predominantly peripheral enhancement. Increased surrounding vasogenic edema in the  frontal and parietal lobes with associated regional mass effect. No midline shift. Basal cisterns are patent. No acute infarct. No hydrocephalus. No extra-axial fluid collection. No acute hemorrhage outside of the metastatic lesion. Vascular: Major arterial flow voids are maintained at the skull base. Skull and upper cervical spine: Normal marrow signal. Sinuses/Orbits: Mild ethmoid air cell mucosal thickening. Unremarkable orbits. Other: No sizable mastoid effusions. IMPRESSION: 1. Significant interval increase in the size of a hemorrhagic superior left frontal lobe intraparenchymal metastasis that measures 3.1 x 2.6 x 3.0 cm (previously 1.5 x 1.4 x 1.8 cm when remeasured). Increased surrounding vasogenic edema with local mass effect. No midline shift. 2. No new lesions identified. Electronically Signed   By: Margaretha Sheffield MD   On: 04/23/2021 15:24   EEG adult  Result Date: 04/30/2021 Lora Havens, MD     04/30/2021 10:49 AM Patient Name: TANICE PETRE MRN: [id number] Epilepsy Attending: Lora Havens Referring Physician/Provider: Dr Derrick Ravel Date: 04/30/2021 Duration: 29.21 mins Patient history: 80 year old female with seizure-like activity.  EEG to evaluate for seizures. Level of alertness: Awake, asleep AEDs during EEG study: LEV Technical aspects: This EEG study was done with scalp electrodes positioned according to the 10-20 International system of electrode placement. Electrical activity was acquired at a sampling rate of 500Hz and reviewed with a high frequency filter of 70Hz and a low frequency filter of 1Hz. EEG data were recorded continuously and digitally stored. Description: The posterior dominant rhythm consists of 9 Hz activity of moderate voltage (25-35 uV) seen predominantly in posterior head regions, asymmetric ( left<right) and reactive to eye opening and eye closing. Sleep was characterized  by vertex waves, sleep spindles (12 to 14 Hz), maximal frontocentral region.  EEG also showed intermittent left temporal 3 to 5 Hz theta-delta slowing.  Hyperventilation and photic stimulation were not performed.   ABNORMALITY - Intermittent slow, left temporal region - Background asymmetry, left<right IMPRESSION: This study is suggestive of cortical dysfunction in left temporal region likely secondary to underlying hemorrhage. No seizures or epileptiform discharges were seen throughout the recording. Priyanka Barbra Sarks   (Echo, Carotid, EGD, Colonoscopy, ERCP)    Subjective: No complaints  Discharge Exam: Vitals:   05/03/21 0452 05/03/21 0751  BP: 118/63 124/63  Pulse: 61 65  Resp: 17 14  Temp: 97.7 F (36.5 C) 98.3 F (36.8 C)  SpO2: 98% 99%   Vitals:   05/03/21 0100 05/03/21 0223 05/03/21 0452 05/03/21 0751  BP: 105/62 (!) 128/58 118/63 124/63  Pulse: 66 72 61 65  Resp: _0 Temp:  98 F (36.7 C) 97.7 F (36.5 C) 98.3 F (36.8 C)  TempSrc:  Oral Oral Oral  SpO2: 95% 98% 98% 99%  Weight:      Height:        General: Pt is alert, awake, not in acute distress Cardiovascular: RRR, S1/S2 +, no rubs, no gallops Respiratory: CTA bilaterally, no wheezing, no rhonchi Abdominal: Soft, NT, ND, bowel sounds + Extremities: no edema, no cyanosis    The results of significant diagnostics from this hospitalization (including imaging, microbiology, ancillary and laboratory) are listed below for reference.     Microbiology: Recent Results (from the past 240 hour(s))  Resp Panel by RT-PCR (Flu A&B, Covid) Nasopharyngeal Swab     Status: None   Collection Time: 04/23/21  1:28 PM   Specimen: Nasopharyngeal Swab; Nasopharyngeal(NP) swabs in vial transport medium  Result Value Ref Range Status   SARS Coronavirus 2 by  RT PCR NEGATIVE NEGATIVE Final    Comment: (NOTE) SARS-CoV-2 target nucleic acids are NOT DETECTED.  The SARS-CoV-2 RNA is generally detectable in upper  respiratory specimens during the acute phase of infection. The lowest concentration of SARS-CoV-2 viral copies this assay can detect is 138 copies/mL. A negative result does not preclude SARS-Cov-2 infection and should not be used as the sole basis for treatment or other patient management decisions. A negative result may occur with  improper specimen collection/handling, submission of specimen other than nasopharyngeal swab, presence of viral mutation(s) within the areas targeted by this assay, and inadequate number of viral copies(<138 copies/mL). A negative result must be combined with clinical observations, patient history, and epidemiological information. The expected result is Negative.  Fact Sheet for Patients:  EntrepreneurPulse.com.au  Fact Sheet for Healthcare Providers:  IncredibleEmployment.be  This test is no t yet approved or cleared by the [place] FDA and  has been authorized for detection and/or diagnosis of SARS-CoV-2 by FDA under an Emergency Use Authorization (EUA). This EUA will remain  in effect (meaning this test can be used) for the duration of the COVID-19 declaration under Section 564(b)(1) of the Act, 21 U.S.C.section 360bbb-3(b)(1), unless the authorization is terminated  or revoked sooner.       Influenza A by PCR NEGATIVE NEGATIVE Final   Influenza B by PCR NEGATIVE NEGATIVE Final    Comment: (NOTE) The Xpert Xpress SARS-CoV-2/FLU/RSV plus assay is intended as an aid in the diagnosis of influenza from Nasopharyngeal swab specimens and should not be used as a sole basis for treatment. Nasal washings and aspirates are unacceptable for Xpert Xpress SARS-CoV-2/FLU/RSV testing.  Fact Sheet for Patients: EntrepreneurPulse.com.au  Fact Sheet for Healthcare Providers: IncredibleEmployment.be  This test is not yet approved or cleared by the [place] FDA and has been  authorized for detection and/or diagnosis of SARS-CoV-2 by FDA under an Emergency Use Authorization (EUA). This EUA will remain in effect (meaning this test can be used) for the duration of the COVID-19 declaration under Section 564(b)(1) of the Act, 21 U.S.C. section 360bbb-3(b)(1), unless the authorization is terminated or revoked.  Performed at [facility], [place] [address] [place], [place] [address]   MRSA Next Gen by PCR, Nasal     Status: None   Collection Time: 04/24/21  6:06 AM   Specimen: Nasal Mucosa; Nasal Swab  Result Value Ref Range Status   MRSA by PCR Next Gen NOT DETECTED NOT DETECTED Final    Comment: (NOTE) The GeneXpert MRSA Assay (FDA approved for NASAL specimens only), is one component of a comprehensive MRSA colonization surveillance program. It is not intended to diagnose MRSA infection nor to guide or monitor treatment for MRSA infections. Test performance is not FDA approved in patients less than 36 years old. Performed at [facility], [place] [address], [place], [place] [address]   Urine Culture     Status: None   Collection Time: 04/24/21  8:53 AM   Specimen: Urine, Clean Catch  Result Value Ref Range Status   Specimen Description URINE, CLEAN CATCH  Final   Special Requests NONE  Final   Culture   Final    NO GROWTH Performed at [facility], [place] [address], [place], [place] [address]    Report Status 04/25/2021 FINAL  Final     Labs: BNP (last 3 results) No results for input(s): BNP in the last 8760 hours. Basic Metabolic Panel: Recent Labs  Lab 04/30/21  0849  NA 131*  K 3.9  CL 100  CO2 23  GLUCOSE 132*  BUN 20  CREATININE 0.65  CALCIUM 8.6*  MG 2.2   Liver Function Tests: No results for input(s): AST, ALT, ALKPHOS, BILITOT, PROT, ALBUMIN in the last 168 hours. No results for input(s): LIPASE, AMYLASE in the last 168 hours. No results for input(s): AMMONIA in the last 168 hours. CBC: Recent Labs  Lab  04/30/21 0849  WBC 17.5*  NEUTROABS 14.2*  HGB 15.1*  HCT 44.5  MCV 94.7  PLT 258   Cardiac Enzymes: No results for input(s): CKTOTAL, CKMB, CKMBINDEX, TROPONINI in the last 168 hours. BNP: Invalid input(s): POCBNP CBG: No results for input(s): GLUCAP in the last 168 hours. D-Dimer No results for input(s): DDIMER in the last 72 hours. Hgb A1c No results for input(s): HGBA1C in the last 72 hours. Lipid Profile No results for input(s): CHOL, HDL, LDLCALC, TRIG, CHOLHDL, LDLDIRECT in the last 72 hours. Thyroid function studies No results for input(s): TSH, T4TOTAL, T3FREE, THYROIDAB in the last 72 hours.  Invalid input(s): FREET3 Anemia work up No results for input(s): VITAMINB12, FOLATE, FERRITIN, TIBC, IRON, RETICCTPCT in the last 72 hours. Urinalysis    Component Value Date/Time   COLORURINE YELLOW 04/24/2021 0915   APPEARANCEUR CLEAR 04/24/2021 0915   LABSPEC 1.016 04/24/2021 0915   PHURINE 6.0 04/24/2021 0915   GLUCOSEU NEGATIVE 04/24/2021 0915   HGBUR NEGATIVE 04/24/2021 0915   BILIRUBINUR NEGATIVE 04/24/2021 0915   KETONESUR NEGATIVE 04/24/2021 0915   PROTEINUR NEGATIVE 04/24/2021 0915   NITRITE NEGATIVE 04/24/2021 0915   LEUKOCYTESUR NEGATIVE 04/24/2021 0915   Sepsis Labs Invalid input(s): PROCALCITONIN,  WBC,  LACTICIDVEN Microbiology Recent Results (from the past 240 hour(s))  Resp Panel by RT-PCR (Flu A&B, Covid) Nasopharyngeal Swab     Status: None   Collection Time: 04/23/21  1:28 PM   Specimen: Nasopharyngeal Swab; Nasopharyngeal(NP) swabs in vial transport medium  Result Value Ref Range Status   SARS Coronavirus 2 by RT PCR NEGATIVE NEGATIVE Final    Comment: (NOTE) SARS-CoV-2 target nucleic acids are NOT DETECTED.  The SARS-CoV-2 RNA is generally detectable in upper respiratory specimens during the acute phase of infection. The lowest concentration of SARS-CoV-2 viral copies this assay can detect is 138 copies/mL. A negative result does not  preclude SARS-Cov-2 infection and should not be used as the sole basis for treatment or other patient management decisions. A negative result may occur with  improper specimen collection/handling, submission of specimen other than nasopharyngeal swab, presence of viral mutation(s) within the areas targeted by this assay, and inadequate number of viral copies(<138 copies/mL). A negative result must be combined with clinical observations, patient history, and epidemiological information. The expected result is Negative.  Fact Sheet for Patients:  EntrepreneurPulse.com.au  Fact Sheet for Healthcare Providers:  IncredibleEmployment.be  This test is no t yet approved or cleared by the [place] FDA and  has been authorized for detection and/or diagnosis of SARS-CoV-2 by FDA under an Emergency Use Authorization (EUA). This EUA will remain  in effect (meaning this test can be used) for the duration of the COVID-19 declaration under Section 564(b)(1) of the Act, 21 U.S.C.section 360bbb-3(b)(1), unless the authorization is terminated  or revoked sooner.       Influenza A by PCR NEGATIVE NEGATIVE Final   Influenza B by PCR NEGATIVE NEGATIVE Final    Comment: (NOTE) The Xpert Xpress SARS-CoV-2/FLU/RSV plus assay is intended as an aid in the diagnosis of  influenza from Nasopharyngeal swab specimens and should not be used as a sole basis for treatment. Nasal washings and aspirates are unacceptable for Xpert Xpress SARS-CoV-2/FLU/RSV testing.  Fact Sheet for Patients: EntrepreneurPulse.com.au  Fact Sheet for Healthcare Providers: IncredibleEmployment.be  This test is not yet approved or cleared by the [place] FDA and has been authorized for detection and/or diagnosis of SARS-CoV-2 by FDA under an Emergency Use Authorization (EUA). This EUA will remain in effect (meaning this test can be used) for the  duration of the COVID-19 declaration under Section 564(b)(1) of the Act, 21 U.S.C. section 360bbb-3(b)(1), unless the authorization is terminated or revoked.  Performed at [facility], [place] [address], [place], [place] [address]   MRSA Next Gen by PCR, Nasal     Status: None   Collection Time: 04/24/21  6:06 AM   Specimen: Nasal Mucosa; Nasal Swab  Result Value Ref Range Status   MRSA by PCR Next Gen NOT DETECTED NOT DETECTED Final    Comment: (NOTE) The GeneXpert MRSA Assay (FDA approved for NASAL specimens only), is one component of a comprehensive MRSA colonization surveillance program. It is not intended to diagnose MRSA infection nor to guide or monitor treatment for MRSA infections. Test performance is not FDA approved in patients less than 44 years old. Performed at [facility], [place] [address], [place], [place] [address]   Urine Culture     Status: None   Collection Time: 04/24/21  8:53 AM   Specimen: Urine, Clean Catch  Result Value Ref Range Status   Specimen Description URINE, CLEAN CATCH  Final   Special Requests NONE  Final   Culture   Final    NO GROWTH Performed at [facility], [place] [address], [place], [place] [address]    Report Status 04/25/2021 FINAL  Final     Time coordinating discharge: Over 30 minutes  SIGNED:   Charlynne Cousins, MD  [facility] 05/03/2021, 11:36 AM Pager   If 7PM-7AM, please contact night-coverage [email or web address] Password [email or web address]

## 2021-05-03 NOTE — Anesthesia Postprocedure Evaluation (Signed)
Anesthesia Post Note  Patient: KHRISTINA NILGES  Procedure(s) Performed: STEREOTACTIC FRONTAL CRANIOTOMY TUMOR EXCISION (Left: Head) APPLICATION OF CRANIAL NAVIGATION (Left)     Patient location during evaluation: PACU Anesthesia Type: General Level of consciousness: awake and alert Pain management: pain level controlled Vital Signs Assessment: post-procedure vital signs reviewed and stable Respiratory status: spontaneous breathing, nonlabored ventilation, respiratory function stable and patient connected to nasal cannula oxygen Cardiovascular status: blood pressure returned to baseline and stable Postop Assessment: no apparent nausea or vomiting Anesthetic complications: no   No notable events documented.  Last Vitals:  Vitals:   05/03/21 0223 05/03/21 0452  BP: (!) 128/58 118/63  Pulse: 72 61  Resp: 18 17  Temp: 36.7 C 36.5 C  SpO2: 98% 98%    Last Pain:  Vitals:   05/03/21 0452  TempSrc: Oral  PainSc:                  Jim Wells S

## 2021-05-03 NOTE — Progress Notes (Signed)
Pt failed TOV. Foley was removed at [facility]. MD aware. Bladder scan revealed 550cc of retention. Ok to transfer to [facility] where they can continue TOV. Hand off provided to receiving RN. All questions and concerns addressed. Relinquishing care.

## 2021-05-04 ENCOUNTER — Ambulatory Visit (HOSPITAL_COMMUNITY): Payer: Medicare Other

## 2021-05-04 ENCOUNTER — Encounter: Payer: Self-pay | Admitting: Oncology

## 2021-05-04 DIAGNOSIS — R339 Retention of urine, unspecified: Secondary | ICD-10-CM | POA: Diagnosis not present

## 2021-05-04 DIAGNOSIS — E871 Hypo-osmolality and hyponatremia: Secondary | ICD-10-CM

## 2021-05-04 DIAGNOSIS — G8191 Hemiplegia, unspecified affecting right dominant side: Secondary | ICD-10-CM | POA: Diagnosis not present

## 2021-05-04 DIAGNOSIS — I480 Paroxysmal atrial fibrillation: Secondary | ICD-10-CM

## 2021-05-04 DIAGNOSIS — C7931 Secondary malignant neoplasm of brain: Secondary | ICD-10-CM | POA: Diagnosis not present

## 2021-05-04 LAB — COMPREHENSIVE METABOLIC PANEL
ALT: 55 U/L — ABNORMAL HIGH (ref 0–44)
AST: 21 U/L (ref 15–41)
Albumin: 2.3 g/dL — ABNORMAL LOW (ref 3.5–5.0)
Alkaline Phosphatase: 50 U/L (ref 38–126)
Anion gap: 4 — ABNORMAL LOW (ref 5–15)
BUN: 16 mg/dL (ref 8–23)
CO2: 27 mmol/L (ref 22–32)
Calcium: 8.3 mg/dL — ABNORMAL LOW (ref 8.9–10.3)
Chloride: 102 mmol/L (ref 98–111)
Creatinine, Ser: 0.41 mg/dL — ABNORMAL LOW (ref 0.44–1.00)
GFR, Estimated: >60 mL/min (ref >60)
Glucose, Bld: 177 mg/dL — ABNORMAL HIGH (ref 70–99)
Potassium: 3.7 mmol/L (ref 3.5–5.1)
Sodium: 133 mmol/L — ABNORMAL LOW (ref 135–145)
Total Bilirubin: 0.4 mg/dL (ref 0.3–1.2)
Total Protein: 4.5 g/dL — ABNORMAL LOW (ref 6.5–8.1)

## 2021-05-04 LAB — CBC WITH DIFFERENTIAL/PLATELET
Abs Immature Granulocytes: 0.75 10*3/uL — ABNORMAL HIGH (ref 0.00–0.07)
Basophils Absolute: 0.1 10*3/uL (ref 0.0–0.1)
Basophils Relative: 0 %
Eosinophils Absolute: 0 10*3/uL (ref 0.0–0.5)
Eosinophils Relative: 0 %
HCT: 35 % — ABNORMAL LOW (ref 36.0–46.0)
Hemoglobin: 12 g/dL (ref 12.0–15.0)
Immature Granulocytes: 5 %
Lymphocytes Relative: 6 %
Lymphs Abs: 0.8 10*3/uL (ref 0.7–4.0)
MCH: 31.8 pg (ref 26.0–34.0)
MCHC: 34.3 g/dL (ref 30.0–36.0)
MCV: 92.8 fL (ref 80.0–100.0)
Monocytes Absolute: 0.7 10*3/uL (ref 0.1–1.0)
Monocytes Relative: 5 %
Neutro Abs: 12.4 10*3/uL — ABNORMAL HIGH (ref 1.7–7.7)
Neutrophils Relative %: 84 %
Platelets: 216 10*3/uL (ref 150–400)
RBC: 3.77 MIL/uL — ABNORMAL LOW (ref 3.87–5.11)
RDW: 14.3 % (ref 11.5–15.5)
WBC: 14.7 10*3/uL — ABNORMAL HIGH (ref 4.0–10.5)
nRBC: 0 % (ref 0.0–0.2)

## 2021-05-04 MED ORDER — DEXAMETHASONE 4 MG PO TABS
8.0000 mg | ORAL_TABLET | Freq: Every day | ORAL | Status: AC
  Administered 2021-05-05 – 2021-05-06 (×2): 8 mg via ORAL
  Filled 2021-05-04 (×2): qty 2

## 2021-05-04 MED ORDER — VITAMIN D 25 MCG (1000 UNIT) PO TABS
5000.0000 [IU] | ORAL_TABLET | Freq: Every day | ORAL | Status: DC
  Administered 2021-05-04 – 2021-05-29 (×26): 5000 [IU] via ORAL
  Filled 2021-05-04 (×27): qty 5

## 2021-05-04 MED ORDER — DEXAMETHASONE 4 MG PO TABS
4.0000 mg | ORAL_TABLET | Freq: Every day | ORAL | Status: AC
  Administered 2021-05-07 – 2021-05-10 (×4): 4 mg via ORAL
  Filled 2021-05-04 (×4): qty 1

## 2021-05-04 MED ORDER — DEXAMETHASONE 4 MG PO TABS: 8.0000 mg | ORAL_TABLET | Freq: Every day | ORAL | Status: DC

## 2021-05-04 MED ORDER — BETHANECHOL CHLORIDE 10 MG PO TABS
10.0000 mg | ORAL_TABLET | Freq: Three times a day (TID) | ORAL | Status: DC
  Administered 2021-05-04 – 2021-05-07 (×9): 10 mg via ORAL
  Filled 2021-05-04 (×10): qty 1

## 2021-05-04 MED ORDER — DEXAMETHASONE 2 MG PO TABS
2.0000 mg | ORAL_TABLET | Freq: Every day | ORAL | Status: DC
  Administered 2021-05-11 – 2021-05-24 (×14): 2 mg via ORAL
  Filled 2021-05-04 (×14): qty 1

## 2021-05-04 MED ORDER — JUVEN PO PACK
1.0000 | PACK | Freq: Two times a day (BID) | ORAL | Status: DC
  Administered 2021-05-04 – 2021-05-27 (×44): 1 via ORAL
  Filled 2021-05-04 (×44): qty 1

## 2021-05-04 NOTE — Discharge Instructions (Addendum)
Inpatient Rehab Discharge Instructions  THELMER EICHE Discharge date and time:    Activities/Precautions/ Functional Status: Activity: no lifting, driving, or strenuous exercise till cleared by MD Diet: regular diet Wound Care: Keep wound clean and dry. .    Functional status:  ___ No restrictions     ___ Walk up steps independently _X__ 24/7 supervision/assistance   ___ Walk up steps with assistance ___ Intermittent supervision/assistance  ___ Bathe/dress independently ___ Walk with walker     ___ Bathe/dress with assistance ___ Walk Independently    ___ Shower independently ___ Walk with assistance    ___ Shower with assistance _X__ No alcohol     ___ Return to work/school ________   Special Instructions: Shannon- PT,OT,ST, RN, aide  Per [facility] statutes, patients with seizures are not allowed to drive until  they have been seizure-free for six months. Use caution when using heavy equipment or power tools. Avoid working on ladders or at heights. Take showers instead of baths. Ensure the water temperature is not too high on the home water heater. Do not go swimming alone. When caring for infants or small children, sit down when holding, feeding, or changing them to minimize risk of injury to the child in the event you have a seizure. Also, Maintain good sleep hygiene. Avoid alcohol.    My questions have been answered and I understand these instructions. I will adhere to these goals and the provided educational materials after my discharge from the hospital.  Patient/Caregiver Signature _______________________________ Date __________  Clinician Signature _______________________________________ Date __________  Please bring this form and your medication list with you to all your follow-up doctor's appointments.

## 2021-05-04 NOTE — Evaluation (Signed)
Speech Language Pathology Assessment and Plan  Patient Details  Name: Angela Carpenter MRN: [id number] Date of Birth: 04-Aug-1941  SLP Diagnosis: Aphasia;Cognitive Impairments  Rehab Potential: Good ELOS: 2.5-3 wks    Today's Date: 05/04/2021 SLP Individual Time: 3614-4315 SLP Individual Time Calculation (min): 45 min SLP Missed Time Calculation (min): 15 min; fatigue   Hospital Problem: Principal Problem:   Metastatic cancer to brain [facility]) Active Problems:   Seizure disorder, focal motor ([place])   Right hemiplegia ([place])  Past Medical History:  Past Medical History:  Diagnosis Date   A-fib ([place])    Atrial fibrillation ([place])    4 years ago started   Brain cancer ([place]) 12/2020   Breast cancer ([place]) 2006   Colon polyps    Glaucoma    Hypercholesteremia    Hypertension    patient denies    Osteoporosis    Personal history of chemotherapy 2006   Personal history of radiation therapy 2006   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   Past Surgical History:  Past Surgical History:  Procedure Laterality Date   APPLICATION OF CRANIAL NAVIGATION Left 05/01/2021   Procedure: APPLICATION OF CRANIAL NAVIGATION;  Surgeon: Consuella Lose, MD;  Location: [place];  Service: Neurosurgery;  Laterality: Left;   AUGMENTATION MAMMAPLASTY Bilateral 2006   Patient had them removed in 2008   BREAST BIOPSY Left 2016   BREAST LUMPECTOMY Right 2006   COLONOSCOPY     had polyps   CRANIOTOMY Left 05/01/2021   Procedure: STEREOTACTIC FRONTAL CRANIOTOMY TUMOR EXCISION;  Surgeon: Consuella Lose, MD;  Location: [place];  Service: Neurosurgery;  Laterality: Left;   EYE SURGERY Right    cataract   MELANOMA EXCISION WITH SENTINEL LYMPH NODE BIOPSY N/A 06/02/2019   Procedure: Left lateral ANAL MELANOMA EXCISION WITH Bilateral Groin exploration and SENTINEL node mapping;  Surgeon: [name], MD;  Location: [facility] [facility];  Service: General;  Laterality: N/A;   TONSILLECTOMY      Assessment / Plan /  Recommendation Clinical Impression    Angela Carpenter is a 80 year old RH female with history of 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 Fib (off Eliquis since 11/2020) who was admitted on 04/23/2021 with 4 week of progression of right-sided weakness to flaccidity with difficulty walking as well as slurred speech.  MRI brain done showing significant interval increase in size of hemorrhagic left frontal lobe metastases with increased surrounding vasogenic edema and local mass-effect.  She was started on IV Decadron with serial CT of head for monitoring per neurology input.  Angela Carpenter was discontinued and case was discussed by   Dr. Alen Blew with multidisciplinary cancer team.  Surgical resection for better disease control was recommended and scheduled for 07/26. She had generalized seizure activity on 07/24 and Keppra was increased to 750 mg twice daily.  Repeat CT of head was stable without increase in size of hemorrhagic lesion.  EEG done showing intermittent slowing in left temporal region likely secondary to underlying hemorrhage and no seizures. She underwent stereotactic left frontal craniotomy for resection of tumor by Dr. Kathyrn Sheriff on 06/26.  To continue decadron with taper on outpatient basis. Therapy re-evals completed showing ongoing deficits due to right hemiplegia, difficulty following multi step commands as well as difficulty with ADL tasks. CIR recommended due to functional decline.    Pt was seen in room for cognitive-linguistic evaluation. Pt was sitting up in bed, "ready to  take a nap". Pt was pleasant and cooperative throughout assessment; however, demonstrated poor frustration tolerance with SLP when making an error on assessment. Pt demonstrated cognitive fatigue towards mid-to-end of session and SLP discontinued evaluation with 15 min left in session.  Pt was assessed using MS Aphasia.  Pt scored 5/5 on naming, 4/5 on Automatic speech (was able to self correct), 5/5 on repetition, 9/10 answering yes/no questions, and 3/5 on complex directions. Pt demonstrated notable anomia in conversation and reported "well, I wouldn't have remembered that word anyway (her former college)". Pt was able to read full sentences, and was able to write her name and address with <2 errors (backwards letters x2). Related to cognition, pt had difficulty completing clock subtest. Numbers were equally spaced; however, she was unable to place hands for the correct time. Pt had difficulty answering complex questions and following complex directions. Pt demonstrated emerging emergent awareness with deficits, but this will be a target moving forward.   Pt reported great familial support at home and reports she will have assistance if needed post hospital stay. SLP rec follow up with medication/financial management due to PLOF. SLP rec speech therapy services to address expressive and receptive language as well as cognitive-communication impairment.     Skilled Therapeutic Interventions          Administered a cognitive-linguistic evaluation. See above for details.   SLP Assessment  Patient will need skilled [facility] during CIR admission    Recommendations  Oral Care Recommendations: Oral care BID Patient destination: Home Follow up Recommendations: Outpatient SLP Equipment Recommended: None recommended by SLP    SLP Frequency 3 to 5 out of 7 days   SLP Duration  SLP Intensity  SLP Treatment/Interventions 2.5-3 wks  Minumum of 1-2 x/day, 30 to 90 minutes  Cognitive remediation/compensation;Environmental controls;Multimodal communication approach;Speech/Language facilitation;Cueing hierarchy;Functional tasks;Therapeutic Activities;Internal/external aids;Patient/family education;Therapeutic Exercise    Pain Pain Assessment Pain Scale: 0-10 Pain Score: 0-No pain  Prior  Functioning Type of Home: House  Lives With: Alone Available Help at Discharge: Available 24 hours/day (son works from home and family lives 10 min away)  SLP Evaluation Cognition Overall Cognitive Status: Impaired/Different from baseline Arousal/Alertness: Lethargic Orientation Level: Oriented X4 Attention: Sustained;Alternating Memory: Impaired Memory Impairment: Decreased short term memory;Decreased recall of new information;Retrieval deficit Decreased Short Term Memory: Verbal complex;Functional complex Immediate Memory Recall: Sock;Blue;Bed Memory Recall Sock: Without Cue Memory Recall Blue: Without Cue Memory Recall Bed: Without Cue Awareness: Impaired Awareness Impairment: Emergent impairment Problem Solving: Impaired Problem Solving Impairment: Verbal complex;Functional complex Executive Function: Organizing;Decision Astronomer: Impaired Organizing: Impaired Decision Making: Impaired Safety/Judgment: Appears intact [employer] Scales of Cognitive Functioning: Purposeful/appropriate  Comprehension Auditory Comprehension Overall Auditory Comprehension: Impaired Yes/No Questions: Within Functional Limits Commands: Impaired Complex Commands: 50-74% accurate Conversation: Complex Interfering Components: Attention;Processing speed EffectiveTechniques: Extra processing time;Pausing;Visual/Gestural cues Reading Comprehension Reading Status: Within funtional limits Expression Expression Primary Mode of Expression: Verbal Verbal Expression Overall Verbal Expression: Impaired Initiation: No impairment Automatic Speech: Day of week Level of Generative/Spontaneous Verbalization: Conversation Repetition: No impairment Naming: No impairment Pragmatics: No impairment Interfering Components: Attention Written Expression Dominant Hand: Right Written Expression: Exceptions to [facility] Interfering Components: Utilizes non-dominant hand;Other (comment)  (backwards letters in single words)     Care Tool Care Tool Cognition Expression of Ideas and Wants Expression of Ideas and Wants: Some difficulty - exhibits some difficulty with expressing needs and ideas (e.g, some words or finishing thoughts) or speech is not clear   Understanding Verbal  and Non-Verbal Content Understanding Verbal and Non-Verbal Content: Usually understands - understands most conversations, but misses some part/intent of message. Requires cues at times to understand   Memory/Recall Ability *first 3 days only Memory/Recall Ability *first 3 days only: Current season;That he or she is in a hospital/hospital unit      Short Term Goals: Week 1: SLP Short Term Goal 1 (Week 1): Pt will demonstrate funtional problem solving skills for safety awareness given min verbal cueing. SLP Short Term Goal 2 (Week 1): Pt will demonstrate use of word finding strategies to decrease anomia in conversation given min verbal cueing. SLP Short Term Goal 3 (Week 1): Pt will follow complex 2-step directions given min verbal cueing. SLP Short Term Goal 4 (Week 1): Pt will complete medication and financial management for complex problem solving with mod verbal cueing.  Refer to Care Plan for Long Term Goals  Recommendations for other services: None   Discharge Criteria: Patient will be discharged from SLP if patient refuses treatment 3 consecutive times without medical reason, if treatment goals not met, if there is a change in medical status, if patient makes no progress towards goals or if patient is discharged from hospital.  The above assessment, treatment plan, treatment alternatives and goals were discussed and mutually agreed upon: by patient  Verdene Lennert MS, CCC-SLP, CBIS  05/04/2021, 12:12 PM

## 2021-05-04 NOTE — Plan of Care (Signed)
  Problem: Consults Goal: RH BRAIN INJURY PATIENT EDUCATION Description: Description: See Patient Education module for eduction specifics Outcome: Progressing Goal: Skin Care Protocol Initiated - if Braden Score 18 or less Description: If consults are not indicated, leave blank or document N/A Outcome: Progressing   Problem: RH BLADDER ELIMINATION Goal: RH STG MANAGE BLADDER WITH ASSISTANCE Description: STG Manage Bladder With Min Assistance Outcome: Progressing   Problem: RH SAFETY Goal: RH STG ADHERE TO SAFETY PRECAUTIONS W/ASSISTANCE/DEVICE Description: STG Adhere to Safety Precautions With Min Assistance/Device. Outcome: Progressing Goal: RH STG DECREASED RISK OF FALL WITH ASSISTANCE Description: STG Decreased Risk of Fall With Cues and Reminders. Outcome: Progressing   Problem: RH COGNITION-NURSING Goal: RH STG USES MEMORY AIDS/STRATEGIES W/ASSIST TO PROBLEM SOLVE Description: STG Uses Memory Aids/Strategies With Cues and Reminders to Problem Solve. Outcome: Progressing   Problem: RH PAIN MANAGEMENT Goal: RH STG PAIN MANAGED AT OR BELOW PT'S PAIN GOAL Description: < 3 on a 0-10 pain scale. Outcome: Progressing

## 2021-05-04 NOTE — Progress Notes (Signed)
Inpatient Rehabilitation Care Coordinator Assessment and Plan Patient Details  Name: Angela Carpenter MRN: [id number] Date of Birth: 1940/11/25  Today's Date: 05/04/2021  Hospital Problems: Principal Problem:   Metastatic cancer to brain [facility]) Active Problems:   Seizure disorder, focal motor ([place])   Right hemiplegia [facility])  Past Medical History:  Past Medical History:  Diagnosis Date   A-fib [name])    Atrial fibrillation ([place])    4 years ago started   Brain cancer ([place]) 12/2020   Breast cancer ([place]) 2006   Colon polyps    Glaucoma    Hypercholesteremia    Hypertension    patient denies    Osteoporosis    Personal history of chemotherapy 2006   Personal history of radiation therapy 2006   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   Past Surgical History:  Past Surgical History:  Procedure Laterality Date   APPLICATION OF CRANIAL NAVIGATION Left 05/01/2021   Procedure: APPLICATION OF CRANIAL NAVIGATION;  Surgeon: Consuella Lose, MD;  Location: [place];  Service: Neurosurgery;  Laterality: Left;   AUGMENTATION MAMMAPLASTY Bilateral 2006   Patient had them removed in 2008   BREAST BIOPSY Left 2016   BREAST LUMPECTOMY Right 2006   COLONOSCOPY     had polyps   CRANIOTOMY Left 05/01/2021   Procedure: STEREOTACTIC FRONTAL CRANIOTOMY TUMOR EXCISION;  Surgeon: Consuella Lose, MD;  Location: [place];  Service: Neurosurgery;  Laterality: Left;   EYE SURGERY Right    cataract   MELANOMA EXCISION WITH SENTINEL LYMPH NODE BIOPSY N/A 06/02/2019   Procedure: Left lateral ANAL MELANOMA EXCISION WITH Bilateral Groin exploration and SENTINEL node mapping;  Surgeon: [name], MD;  Location: [place];  Service: General;  Laterality: N/A;   TONSILLECTOMY     Social History:  reports that she is a non-smoker but has been exposed to tobacco smoke. She has never used smokeless tobacco. She reports that she does not drink alcohol and does not use drugs.  Family / Support Systems Marital Status:  Widow/Widower How Long?: 5 years ago Patient Roles: Parent Spouse/Significant Other: Widowed Children: 1 adult son [name] [phone] Other Supports: son [name], and support from brother Louie Casa and sister in Sports coach, sister Anticipated Caregiver: son [name] Ability/Limitations of Caregiver: son works from home, and will have support from her siblings. Caregiver Availability: 24/7 Family Dynamics: Pt lives alone.  Social History Preferred language: English Religion: Methodist Cultural Background: Pt worked as an IT trainer for special ed. students for 26 years. 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 Pt adjusting to current condition. Recent Psychosocial Issues: Denies Psychiatric History: Denies Substance Abuse History: Denies  Patient / Family Perceptions, Expectations & Goals Pt/Family understanding of illness & functional limitations: Pt and family have a general understanding of care needs Premorbid pt/family roles/activities: Independent Anticipated changes in roles/activities/participation: Assistance with ADLs/IADLs Pt/family expectations/goals: Pt goal  is " to get out of here, to be as independent as possible, and get strength back."  [employer]: None Premorbid Home Care/DME Agencies: None Transportation available at discharge: TBD Resource referrals recommended: Neuropsychology  Discharge Planning Living  Arrangements: Alone  Support Systems: Children, Other relatives Type of Residence: Private residence Insurance Resources: [employer], Multimedia programmer (specify) Sports administrator) Financial Resources: Fish farm manager, Other (Comment) Financial Screen Referred: No Living Expenses: Own Money Management: Patient Does the patient have any problems obtaining your medications?: No Home Management: Pt managed all homecare needs Patient/Family Preliminary Plans: TBD Care Coordinator Anticipated Follow Up Needs: HH/OP  Clinical Impression SW met with pt and pt brother Louie Casa in room to introduce self, explain role, and discuss discharge process. Pt is not a veteran, but her husband was an Scientist, research (life sciences). HCPOA is her son [name]. DME: rollator and access to transport w/c. Pt aware SW to follow-up with her son [name].  33- SW made contact with pt son [name] to introduce self, explain role, and discuss discharge process. He confirms he has the flexibility to work from home, and it will be a collective family effort to provide care to his mother. SW to f/u after team conference next week.  Tabari Volkert A Ronan Duecker 05/04/2021, 3:35 PM

## 2021-05-04 NOTE — Evaluation (Signed)
Physical Therapy Assessment and Plan  Patient Details  Name: COTY STUDENT MRN: [id number] Date of Birth: February 03, 1941  PT Diagnosis: Hemiplegia non-dominant Rehab Potential: Good ELOS: 18-21 days   Today's Date: 05/04/2021 PT Individual Time: 1300-1410 PT Individual Time Calculation (min): 70 min    Hospital Problem: Principal Problem:   Metastatic cancer to brain [facility]) Active Problems:   Seizure disorder, focal motor ([place])   Right hemiplegia ([place])   Past Medical History:  Past Medical History:  Diagnosis Date   A-fib ([place])    Atrial fibrillation ([place])    4 years ago started   Brain cancer ([place]) 12/2020   Breast cancer ([place]) 2006   Colon polyps    Glaucoma    Hypercholesteremia    Hypertension    patient denies    Osteoporosis    Personal history of chemotherapy 2006   Personal history of radiation therapy 2006   Stroke (cerebrum) ([place]) 2015   Past Surgical History:  Past Surgical History:  Procedure Laterality Date   APPLICATION OF CRANIAL NAVIGATION Left 05/01/2021   Procedure: APPLICATION OF CRANIAL NAVIGATION;  Surgeon: Consuella Lose, MD;  Location: [place];  Service: Neurosurgery;  Laterality: Left;   AUGMENTATION MAMMAPLASTY Bilateral 2006   Patient had them removed in 2008   BREAST BIOPSY Left 2016   BREAST LUMPECTOMY Right 2006   COLONOSCOPY     had polyps   CRANIOTOMY Left 05/01/2021   Procedure: STEREOTACTIC FRONTAL CRANIOTOMY TUMOR EXCISION;  Surgeon: Consuella Lose, MD;  Location: [place];  Service: Neurosurgery;  Laterality: Left;   EYE SURGERY Right    cataract   MELANOMA EXCISION WITH SENTINEL LYMPH NODE BIOPSY N/A 06/02/2019   Procedure: Left lateral ANAL MELANOMA EXCISION WITH Bilateral Groin exploration and SENTINEL node mapping;  Surgeon: [name], MD;  Location: [place];  Service: General;  Laterality: N/A;   TONSILLECTOMY      Assessment & Plan Clinical Impression:  Oriana Horiuchi is a 80 year old RH female with history of  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 Fib (off Eliquis since 11/2020) who was admitted on 04/23/2021 with 4 week of progression of right-sided weakness to flaccidity with difficulty walking as well as slurred speech.  MRI brain done showing significant interval increase in size of hemorrhagic left frontal lobe metastases with increased surrounding vasogenic edema and local mass-effect.  She was started on IV Decadron with serial CT of head for monitoring per neurology input.  Beryle Flock was discontinued and case was discussed by   Dr. Alen Blew with multidisciplinary cancer team.  Surgical resection for better disease control was recommended and scheduled for 07/26. Therapy has been ongoing during her stay and she was found to have mild expressive impairments with word finding deficits and paraphasias.  PT/OT has been working on bed mobility and standing attempts in steady. She had generalized seizure activity on 07/24 and Keppra was increased to 750 mg twice daily.  Repeat CT of head was stable without increase in size of hemorrhagic lesion.  EEG done showing intermittent slowing in left temporal region likely secondary to underlying hemorrhage and no seizures. She underwent stereotactic left frontal craniotomy for resection of tumor by Dr. Kathyrn Sheriff on 06/26.  To continue decadron with taper on outpatient basis. Therapy re-evals completedPatient transferred to CIR on 05/03/2021 .   Patient currently requires max with mobility secondary to muscle weakness, muscle joint tightness, and  muscle paralysis, decreased cardiorespiratoy endurance, abnormal tone, unbalanced muscle activation, and decreased coordination, and decreased safety awareness and decreased memory.  Prior to hospitalization, patient was independent  with mobility and lived with Alone in a House home.  Home access is 4Stairs to enter.  Patient  will benefit from skilled PT intervention to maximize safe functional mobility, minimize fall risk, and decrease caregiver burden for planned discharge home with 24 hour assist.  Anticipate patient will benefit from follow up [facility] at discharge.  PT - End of Session Activity Tolerance: Tolerates 30+ min activity with multiple rests Endurance Deficit: Yes Endurance Deficit Description: Pt required mult rest breaks in sitting w/parallel bar standing activity PT Assessment Rehab Potential (ACUTE/IP ONLY): Good PT Barriers to Discharge: [place] home environment;Incontinence;Other (comments) PT Barriers to Discharge Comments: nature of metastatic disease PT Patient demonstrates impairments in the following area(s): Balance;Endurance;Motor;Safety PT Transfers Functional Problem(s): Bed Mobility;Bed to Chair;Car;Furniture PT Locomotion Functional Problem(s): Ambulation;Wheelchair Mobility;Stairs PT Plan PT Intensity: Minimum of 1-2 x/day ,45 to 90 minutes PT Frequency: 5 out of 7 days PT Duration Estimated Length of Stay: 18-21 days PT Treatment/Interventions: Ambulation/gait training;Discharge planning;Functional mobility training;Psychosocial support;Therapeutic Activities;Balance/vestibular training;Neuromuscular re-education;Therapeutic Exercise;Wheelchair propulsion/positioning;Cognitive remediation/compensation;DME/adaptive equipment instruction;Splinting/orthotics;UE/LE Strength taining/ROM;Community reintegration;Functional electrical stimulation;Patient/family education;Stair training;UE/LE Coordination activities PT Transfers Anticipated Outcome(s): min assist PT Locomotion Anticipated Outcome(s): short distance HHA w/min to mod assist PT Recommendation Recommendations for Other Services: Neuropsych consult;Therapeutic Recreation consult Therapeutic Recreation Interventions: Pet therapy;Stress management Follow Up Recommendations: Home health PT Patient destination: Home Equipment  Recommended: To be determined Equipment Details: only has rollator which will not be appropriate for this pt   PT Evaluation Precautions/Restrictions Precautions Precautions: Fall Precaution Comments: R hemiparesis Required Braces or Orthoses: Sling Restrictions Weight Bearing Restrictions: No General   Vital SignsTherapy Vitals Temp: 97.9 F (36.6 C) Temp Source: Oral Pulse Rate: 68 Resp: 20 BP: 128/73 Patient Position (if appropriate): Sitting Oxygen Therapy SpO2: 100 % O2 Device: Room Air Pain Pain Assessment Pain Scale: 0-10 Pain Score: 0-No pain Home Living/Prior Functioning Home Living Living Arrangements: Alone Available Help at Discharge: Available 24 hours/day Type of Home: House Home Access: Stairs to enter Technical brewer of Steps: 4 Entrance Stairs-Rails: Left Alternate Level Stairs-Number of Steps: flight Bathroom Shower/Tub: Walk-in shower;Door Bathroom Toilet: Handicapped height Bathroom Accessibility: Yes Additional Comments: has walker  Lives With: Alone Prior Function Level of Independence: Independent with basic ADLs;Independent with homemaking with ambulation  Able to Take Stairs?: Yes Driving: Yes Comments: I with ADLs/IADLs; recent stopped driving secondary to seizure Vision/Perception     Cognition Overall Cognitive Status: Impaired/Different from baseline Arousal/Alertness: Awake/alert Orientation Level: Oriented X4 Attention: Sustained;Alternating Memory Impairment: Decreased short term memory;Decreased recall of new information;Retrieval deficit Sensation Sensation Light Touch: Appears Intact Hot/Cold: Not tested Proprioception: Appears Intact Coordination Gross Motor Movements are Fluid and Coordinated: No Fine Motor Movements are Fluid and Coordinated: No Coordination and Movement Description: R hemiplegia Finger Nose Finger Test: unable R Heel Shin Test: unable R Motor  Motor Motor: Hemiplegia Motor - Skilled  Clinical Observations: dense R hemi   Trunk/Postural Assessment  Cervical Assessment Cervical Assessment: Exceptions to [facility] (forward head) Thoracic Assessment Thoracic Assessment: Exceptions to [facility] (rounded shoulders, retraction of R shoulder and  rounding of upper thoracic spine, shoulder subluxation R) Lumbar Assessment Lumbar Assessment: Exceptions to [facility] (post tilt, R hip retraction) Postural Control Postural Control: Deficits on evaluation Trunk Control: stands w/max assist, retraction of R shoulder and hip Righting Reactions: absent on R Protective Responses: absent on R  Balance Balance Balance Assessed: Yes Dynamic Sitting Balance Sitting balance - Comments: min assist for cynamic balance while scooting hips L/R/and forward in prep for transfer, min assist to return to upright from R elbow. Static Standing Balance Static Standing - Balance Support: Left upper extremity supported Static Standing - Level of Assistance: 2: Max assist Static Standing - Comment/# of Minutes: stood 5 min in parallel bars while working on balance, postural control, max assist to stabilize RLE at knee/hip Extremity Assessment  RUE Assessment RUE Assessment: Exceptions to [facility] General Strength Comments: R sublux shoudler, no active movement detected throughout. LUE Assessment LUE Assessment: Within Functional Limits RLE Assessment Passive Range of Motion (PROM) Comments: tightness of R ankle gastroc/soleus, able to achieve neutral w/overpressure/soft end feel General Strength Comments: able to elicit R hip add/IR in bridge position w/overpressure to LLE in 2-/5 range, fatigues relatively quickly LLE Assessment LLE Assessment: Within Functional Limits  Care Tool Care Tool Bed Mobility Roll left and right activity   Roll left and right assist level: Maximal Assistance - Patient 25 - 49%    Sit to lying activity   Sit to lying assist level: Maximal Assistance - Patient 25 - 49%    Lying to  sitting edge of bed activity   Lying to sitting edge of bed assist level: Maximal Assistance - Patient 25 - 49%     Care Tool Transfers Sit to stand transfer   Sit to stand assist level: Maximal Assistance - Patient 25 - 49%    Chair/bed transfer   Chair/bed transfer assist level: Maximal Assistance - Patient 25 - 49%     Physiological scientist transfer assist level: Total Assistance - Patient < 25%      Care Tool Locomotion Ambulation Ambulation activity did not occur: Safety/medical concerns        Walk 10 feet activity Walk 10 feet activity did not occur: Safety/medical concerns       Walk 50 feet with 2 turns activity Walk 50 feet with 2 turns activity did not occur: Safety/medical concerns      Walk 150 feet activity Walk 150 feet activity did not occur: Safety/medical concerns      Walk 10 feet on uneven surfaces activity Walk 10 feet on uneven surfaces activity did not occur: Safety/medical concerns      Stairs Stair activity did not occur: Safety/medical concerns        Walk up/down 1 step activity Walk up/down 1 step or curb (drop down) activity did not occur: Safety/medical concerns     Walk up/down 4 steps activity did not occuR: Safety/medical concerns  Walk up/down 4 steps activity      Walk up/down 12 steps activity Walk up/down 12 steps activity did not occur: Safety/medical concerns      Pick up small objects from floor Pick up small object from the floor (from standing position) activity did not occur: Safety/medical concerns      Wheelchair Will patient use wheelchair at discharge?: Yes Type of Wheelchair: Manual   Wheelchair assist level: Moderate Assistance - Patient 50 - 74% Max wheelchair distance: 50  Wheel 50 feet with 2 turns activity   Assist Level: Moderate Assistance - Patient 50 - 74%  Wheel 150 feet activity Wheelchair 150 feet activity did not occur: Safety/medical concerns      Refer to Care Plan for  Long Term Goals  SHORT TERM GOAL WEEK 1 PT Short  Term Goal 1 (Week 1): Pt will perform squat pivot transfer to/from wc to L side w/mod assist of 1 PT Short Term Goal 2 (Week 1): pt will propel wc using hemi technique x 180f w/additional time and supervision PT Short Term Goal 3 (Week 1): pt will stand statically w/LUE support and min assist x 2 min LRAD/use of RLE blocking/bracing as needed PT Short Term Goal 4 (Week 1): supine to side to sit w/cga only  Recommendations for other services: Neuropsych and Therapeutic Recreation  Pet therapy and Stress management  Skilled Therapeutic Intervention  Evaluation completed (see details above and below) with education on PT POC and goals and individual treatment initiated with focus on functional mobility/transfers, LE strength, dynamic standing balance/coordination, seating/positioning/wc skills, simulated car transfers, and improved endurance with activity Pt  oriented to rehab unit.  Discussed process of daily scheduling and receiving schedule, team conference schedule and D/c planning.  Pt provided w/ 16X16  wheelchair and cushion and adjustments made to promote optimal seating posture and pressure distribution. Hemiheight wc to allow for training w/propulsion via hemi technique.  May need 18wide when hemiheight available.    See below/above for full evaluation.  In supine, worked on RSouthern Companyvia applying overpressure to RLE IR/ADD in brige position which was able to facilitate activation of same muscle groups on R.  Repeated 8-10 times prior to fatigue. Pt instructed w/mechanics for squat pivot transfer to L which required max assist and cues for sequencing, safety, body mechanics.  Pt instructed w/wc propulsion via hemitechnique.  Pt transported to gym and worked in parallel bars on: Sit to stand from wExxon Mobil Corporationmax assist Static standing w/LUE support, RLE blocking fading to guarding, wt shifting, pelvic/trunk rotation to L on R, wt shifting to RLE to  promote wbing thru limb.  Able to tolerate activity up to 5 min at time.  During rest discussed goals of session, purpose of wbing activities, pt potential and positives noted during eval as pt voicing frustration w/current disability, possible need for wc as primary mode of mobility at dc but not limiting therapy/including standing/transfer training/progression to gait if progress allows.  Pt receptive.    At end of session, pt educated re use of chair alarm and no objection by pt.  Pt left oob in wc w/alarm belt set and needs in reach  Bed Mobility Bed Mobility: Rolling Right;Rolling Left;Right Sidelying to Sit;Sit to Supine Rolling Right: Moderate Assistance - Patient 50-74% Rolling Left: Maximal Assistance - Patient 25-49% Right Sidelying to Sit: Maximal Assistance - Patient 25-49% Sit to Supine: Maximal Assistance - Patient 25-49% Transfers Transfers: Sit to Stand;Stand to Sit;Squat Pivot Transfers Sit to Stand: Maximal Assistance - Patient 25-49% Stand to Sit: Maximal Assistance - Patient 25-49% Squat Pivot Transfers: Maximal Assistance - Patient 25-49% Transfer (Assistive device): None Locomotion  Gait Ambulation: No Gait Gait: No Stairs / Additional Locomotion Stairs: No Wheelchair Mobility Wheelchair Mobility: Yes Wheelchair Assistance: Moderate Assistance - Patient 50 - 74% Wheelchair Propulsion: Left upper extremity;Left lower extremity Wheelchair Parts Management: Needs assistance Distance: 50   Discharge Criteria: Patient will be discharged from PT if patient refuses treatment 3 consecutive times without medical reason, if treatment goals not met, if there is a change in medical status, if patient makes no progress towards goals or if patient is discharged from hospital.  The above assessment, treatment plan, treatment alternatives and goals were discussed and mutually agreed upon: by patient  BJerrilyn Cairo7/29/2022, 4:15 PM

## 2021-05-04 NOTE — Progress Notes (Signed)
Occupational Therapy Session Note  Patient Details  Name: Angela Carpenter MRN: [id number] Date of Birth: Dec 12, 1940  Today's Date: 05/04/2021 OT Individual Time: 6503-5465 OT Individual Time Calculation (min): 30 min   Short Term Goals: Week 1:  OT Short Term Goal 1 (Week 1): Pt will don shirt wiht MOD A OT Short Term Goal 2 (Week 1): Pt will recall hemi strategies wiht MIN VC OT Short Term Goal 3 (Week 1): Pt will thread 1LE into pants OT Short Term Goal 4 (Week 1): Pt will groom in unsupported sitting wiht MIN A  Skilled Therapeutic Interventions/Progress Updates:  Pt greeted sitting in wc and agreeable to OT treatment session. Pt brought to therapy day room and OT removed R UE from sling. OT educated on use of sling for transfers and mobility to support R shoulder joint. Discussed risk of contractures with leaving R UE in sling and educated on self ROM techniques including elbow flex/ext. OT issued 1/2 lap tray to also provide support. Joint input through R elbow/wrist to push/pull through shoulder and elbow flex/ext. Pt returned to room and left seated in wc with alarm belt on, call bell in reach, and needs met.   Therapy Documentation Precautions:  Precautions Precautions: Fall Precaution Comments: R hemiparesis Required Braces or Orthoses: Sling Restrictions Weight Bearing Restrictions: No RLE Weight Bearing: Weight bearing as tolerated Pain: Pain Assessment Pain Scale: 0-10 Pain Score: 0-No pain  Therapy/Group: Individual Therapy  Valma Cava 05/04/2021, 3:01 PM

## 2021-05-04 NOTE — Evaluation (Signed)
Occupational Therapy Assessment and Plan  Patient Details  Name: Angela Carpenter MRN: [id number] Date of Birth: July 15, 1941  OT Diagnosis: abnormal posture, cognitive deficits, flaccid hemiplegia and hemiparesis, and muscle weakness (generalized) Rehab Potential:   ELOS: 3 weeks   Today's Date: 05/04/2021 OT Individual Time: 0800-0900 OT Individual Time Calculation (min): 60 min     Hospital Problem: Principal Problem:   Metastatic cancer to brain [facility]) Active Problems:   Seizure disorder, focal motor ([place])   Right hemiplegia ([place])   Past Medical History:  Past Medical History:  Diagnosis Date   A-fib ([place])    Atrial fibrillation ([place])    4 years ago started   Brain cancer ([place]) 12/2020   Breast cancer ([place]) 2006   Colon polyps    Glaucoma    Hypercholesteremia    Hypertension    patient denies    Osteoporosis    Personal history of chemotherapy 2006   Personal history of radiation therapy 2006   Stroke (cerebrum) ([place]) 2015   Past Surgical History:  Past Surgical History:  Procedure Laterality Date   APPLICATION OF CRANIAL NAVIGATION Left 05/01/2021   Procedure: APPLICATION OF CRANIAL NAVIGATION;  Surgeon: Consuella Lose, MD;  Location: [place];  Service: Neurosurgery;  Laterality: Left;   AUGMENTATION MAMMAPLASTY Bilateral 2006   Patient had them removed in 2008   BREAST BIOPSY Left 2016   BREAST LUMPECTOMY Right 2006   COLONOSCOPY     had polyps   CRANIOTOMY Left 05/01/2021   Procedure: STEREOTACTIC FRONTAL CRANIOTOMY TUMOR EXCISION;  Surgeon: Consuella Lose, MD;  Location: [place];  Service: Neurosurgery;  Laterality: Left;   EYE SURGERY Right    cataract   MELANOMA EXCISION WITH SENTINEL LYMPH NODE BIOPSY N/A 06/02/2019   Procedure: Left lateral ANAL MELANOMA EXCISION WITH Bilateral Groin exploration and SENTINEL node mapping;  Surgeon: [name], MD;  Location: [place];  Service: General;  Laterality: N/A;   TONSILLECTOMY      Assessment &  Plan Clinical Impression:   Pt is a 80 y/o female presenting to ED with worsening R sided weakness in setting of marked expansion of L frotnal hemorrhagic met with surrounding vasogenic edema on 04/23/21. Patient now completely flaccid in RUE/RLE with dysarthria. Pt s/p L frontal craniotomy for resection of metastatic hemorrhagic tumor on 05/01/21. 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   Patient currently requires max with basic self-care skills secondary to muscle weakness, decreased cardiorespiratoy endurance, impaired timing and sequencing, abnormal tone, unbalanced muscle activation, motor apraxia, decreased coordination, and decreased motor planning, decreased attention, decreased awareness, decreased problem solving, decreased safety awareness, and decreased memory, and decreased sitting balance, decreased standing balance, decreased postural control, hemiplegia, and decreased balance strategies.  Prior to hospitalization, patient could complete BADL/IADL with independent .  Patient will benefit from skilled intervention to decrease level of assist with basic self-care skills and increase independence with basic self-care skills prior to discharge home with care partner.  Anticipate patient will require 24 hour supervision and minimal physical assistance and follow up home health.  OT Assessment OT Barriers to Discharge: Inaccessible home environment OT Patient demonstrates impairments in the following area(s): Balance;Cognition;Endurance;Motor;Safety;Sensory;Skin Integrity OT Basic ADL's Functional Problem(s): Grooming;Bathing;Dressing;Toileting OT Transfers Functional Problem(s): Toilet;Tub/Shower OT Additional Impairment(s): Fuctional Use of Upper Extremity OT Plan OT Intensity: Minimum of 1-2 x/day, 45  to 90  minutes OT Frequency: 5 out of 7 days OT Duration/Estimated Length of Stay: 3 weeks OT Treatment/Interventions: Balance/vestibular training;Discharge planning;Functional electrical stimulation;Pain management;Self Care/advanced ADL retraining;UE/LE Coordination activities;Therapeutic Activities;Cognitive remediation/compensation;Disease mangement/prevention;Functional mobility training;Patient/family education;Skin care/wound managment;Therapeutic Exercise;Visual/perceptual remediation/compensation;Wheelchair propulsion/positioning;UE/LE Strength taining/ROM;Splinting/orthotics;Psychosocial support;Neuromuscular re-education;DME/adaptive equipment instruction;Community reintegration OT Self Feeding Anticipated Outcome(s): S OT Basic Self-Care Anticipated Outcome(s): S UB; MIN A LB dressing OT Toileting Anticipated Outcome(s): MIN A OT Bathroom Transfers Anticipated Outcome(s): MIN A OT Recommendation Patient destination: Home Follow Up Recommendations: Home health OT Equipment Recommended: 3 in 1 bedside comode;Tub/shower seat;To be determined   OT Evaluation Precautions/Restrictions  Precautions Precautions: Fall Precaution Comments: R hemiparesis Required Braces or Orthoses: Sling Restrictions Weight Bearing Restrictions: No RLE Weight Bearing: Weight bearing as tolerated General Chart Reviewed: Yes Family/Caregiver Present: No Vital Signs   Pain Pain Assessment Pain Scale: 0-10 Pain Score: 0-No pain Home Living/Prior Functioning Home Living Family/patient expects to be discharged to:: Private residence Living Arrangements: Alone Available Help at Discharge: Available 24 hours/day (son works from home and family lives 10 min away) Type of Home: House Home Access: Stairs to enter Technical brewer of Steps: 4 Entrance Stairs-Rails: Left Home Layout: Two level, Able to live on main level with bedroom/bathroom Alternate Level Stairs-Number of Steps: flight Bathroom  Shower/Tub: Gaffer, Door [employer] Toilet: Handicapped height Bathroom Accessibility: Yes Additional Comments: has walker  Lives With: Alone IADL History Homemaking Responsibilities: Yes Meal Prep Responsibility: Primary Laundry Responsibility: Primary Cleaning Responsibility: Primary Bill Paying/Finance Responsibility: Primary Shopping Responsibility: Primary Current License: Yes Mode of Transportation: Car Leisure and Hobbies: 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 Prior Function Level of Independence: Independent with basic ADLs, Independent with homemaking with ambulation  Able to Take Stairs?: Yes Driving: Yes Comments: I with ADLs/IADLs; recent stopped driving secondary to seizure Vision Wears Glasses:  (computer glasses) Patient Visual Report: No change from baseline Vision Assessment?: No apparent visual deficits Perception  Perception: Within Functional Limits Praxis Praxis: Impaired Cognition Overall Cognitive Status: Impaired/Different from baseline Arousal/Alertness: Awake/alert Orientation Level: Person;Place;Situation Person: Oriented Place: Oriented Situation: Oriented Year: 2022 Month: July Day of Week: Correct Memory: Appears intact Immediate Memory Recall: Sock;Blue;Bed Memory Recall Sock: Without Cue Memory Recall Blue: Without Cue Memory Recall Bed: Without Cue Safety/Judgment: Appears intact Sensation Sensation Light Touch: Appears Intact Hot/Cold: Appears Intact Proprioception: Appears Intact Motor  Motor Motor: Hemiplegia Motor - Skilled Clinical Observations: dense R hemi  Trunk/Postural Assessment  Cervical Assessment Cervical Assessment:  (head forward) Thoracic Assessment Thoracic Assessment:  (rounded shoulders- sublux RUE) Lumbar Assessment Lumbar Assessment:  (post pelvic tilt) Postural Control Postural Control: Deficits on evaluation (delayed/absent R)  Balance Balance Balance Assessed: Yes Dynamic  Sitting Balance Sitting balance - Comments: Could sit at EOB with supervision.  Worked on weight shifting/leaning onto R elbow with min A to support R arm and pt able to return to midline with min A.  10 reps onto R arm with 10 sec hold Static Standing Balance Static Standing - Level of Assistance: 2: Max assist Extremity/Trunk Assessment RUE Assessment RUE Assessment: Exceptions to [facility] General Strength Comments: R sublux shoudler RUE Body System: Neuro Brunstrum levels for arm and hand: Arm;Hand Brunstrum level for arm: Stage I Presynergy Brunstrum level for hand: Stage I Flaccidity LUE Assessment LUE Assessment: Within Functional Limits  Care Tool Care Tool Self Care Eating        Oral Care    Oral Care Assist Level: Moderate Assistance - Patient 50 - 74%  Bathing   Body parts bathed by patient: Right arm;Chest;Abdomen;Right upper leg;Left upper leg;Face Body parts bathed by helper: Left arm;Buttocks;Front perineal area;Right lower leg;Left lower leg   Assist Level: Maximal Assistance - Patient 24 - 49% (stedy for standing peri/buttock hygiene)    Upper Body Dressing(including orthotics)   What is the patient wearing?: Pull over shirt;Orthosis (sling) Orthosis activity level: Performed by helper Assist Level: Maximal Assistance - Patient 25 - 49%    Lower Body Dressing (excluding footwear)   What is the patient wearing?: Pants Assist for lower body dressing: Total Assistance - Patient < 25%    Putting on/Taking off footwear   What is the patient wearing?: Non-skid slipper socks Assist for footwear: Total Assistance - Patient < 25%       Care Tool Toileting Toileting activity         Care Tool Bed Mobility Roll left and right activity   Roll left and right assist level: Maximal Assistance - Patient 25 - 49%    Sit to lying activity   Sit to lying assist level: Maximal Assistance - Patient 25 - 49%    Lying to sitting edge of bed activity   Lying to sitting  edge of bed assist level: Maximal Assistance - Patient 25 - 49%     Care Tool Transfers Sit to stand transfer   Sit to stand assist level: Maximal Assistance - Patient 25 - 49%    Chair/bed transfer   Chair/bed transfer assist level: Dependent - mechanical lift     Toilet transfer   Assist Level: Dependent - Patient 0% (stedy)     Care Tool Cognition Expression of Ideas and Wants Expression of Ideas and Wants: Some difficulty - exhibits some difficulty with expressing needs and ideas (e.g, some words or finishing thoughts) or speech is not clear   Understanding Verbal and Non-Verbal Content Understanding Verbal and Non-Verbal Content: Usually understands - understands most conversations, but misses some part/intent of message. Requires cues at times to understand   Memory/Recall Ability *first 3 days only Memory/Recall Ability *first 3 days only: Current season;Staff names and faces;That he or she is in a hospital/hospital unit    Refer to Care Plan for [place] 1 OT Short Term Goal 1 (Week 1): Pt will don shirt wiht MOD A OT Short Term Goal 2 (Week 1): Pt will recall hemi strategies wiht MIN VC OT Short Term Goal 3 (Week 1): Pt will thread 1LE into pants OT Short Term Goal 4 (Week 1): Pt will groom in unsupported sitting wiht MIN A  Recommendations for other services: Therapeutic Recreation  Pet therapy and Stress management   Skilled Therapeutic Intervention 1:1. Pt received in bed agreeable to OT after education on role/purpose of CIR, ELOS, POC and BI recovery. Pt completes EOB ADL as written below. Overall pt requires MAX A for mobility and ADLs. MOD A with stedy use for peri bathing/buttock hygiene. Exited session with pt seated in bed, exit alarm on and call light in reach   ADL ADL Grooming: Moderate assistance Where Assessed-Grooming: Edge of bed Upper Body Bathing: Moderate assistance Where Assessed-Upper Body Bathing: Edge of bed Lower  Body Bathing: Maximal assistance Where Assessed-Lower Body Bathing: Edge of bed Upper Body Dressing: Maximal assistance Where Assessed-Upper Body Dressing: Edge of bed Lower Body Dressing: Maximal assistance Where Assessed-Lower Body Dressing: Edge of bed Toileting: Maximal assistance Where Assessed-Toileting: Bedside Commode (stedy) Toilet Transfer: Maximal verbal cueing Armed forces technical officer  Method: Other (comment) (stedy) Tub/Shower Transfer: Not assessed Mobility  Transfers Sit to Stand: Maximal Assistance - Patient 25-49% Stand to Sit: Maximal Assistance - Patient 25-49%   Discharge Criteria: Patient will be discharged from OT if patient refuses treatment 3 consecutive times without medical reason, if treatment goals not met, if there is a change in medical status, if patient makes no progress towards goals or if patient is discharged from hospital.  The above assessment, treatment plan, treatment alternatives and goals were discussed and mutually agreed upon: by patient  Tonny Branch 05/04/2021, 9:42 AM

## 2021-05-04 NOTE — Progress Notes (Signed)
Cristina Gong, RN  Rehab Admission Coordinator  Physical Medicine and Rehabilitation  Progress Notes     Addendum  Date of Service:  05/03/2021  5:34 PM               Show:Clear all _0 Written_1 Templated_2 Copied  Added by: _3 Cristina Gong, RN   _4 Hover for details                       Courtney Heys, MD   Physician  Physical Medicine and Rehabilitation  PMR Pre-admission      Signed   Date of Service:  05/02/2021  3:08 PM        Related encounter: ED to Hosp-Admission (Discharged) from 04/23/2021 in [place] [facility]                 Signed              Show:Clear all _5 Written_6 Templated_7 Copied   Added by: _8 Cristina Gong, RN_9 Courtney Heys, MD     _10 Hover for details  PMR Admission Coordinator Pre-Admission Assessment   Patient: Angela Carpenter is an 80 y.o., female MRN: [id number] DOB: 12-21-1940 Height: _0  (165.1 cm) Weight: 67.3 kg   Insurance Information HMO:     PPO:      PCP:      IPA:      80/20:      OTHER: PRIMARY: Medicare a and b       Policy#: [id number]        pt      Subscriber: pt Benefits:  Phone #: passport one online     Name: 7/27 Eff. Date: 08/07/2008     Deduct: $1556      Out of Pocket Max: none      Life Max: none CIR: 100%      SNF: 20 full days Outpatient: 80%     Co-Pay: 20% Home Health: 100%      Co-Pay: none DME: 80%     Co-Pay: 20% Providers: pt choice  SECONDARY: Tricare for Life      Policy#: [id number]   Financial Counselor:       Phone#:   The "Data Collection Information Summary" for patients in Inpatient Rehabilitation Facilities with attached "Privacy Act [place] Records" was provided and verbally reviewed with: Patient and Family   Emergency Contact Information Contact Information       Name Relation Home Work Mobile    Gotts,Scott Son     [phone]           Current Medical History  Patient Admitting Diagnosis: frontal lobe metastatic melanoma   History of Present Illness:  80 year old RH female with history of metastatic melanoma to brain with hemorrhagic metastases to left frontal lobe with residual right-sided weakness s/p SRS, breast cancer, A. fib who was admitted on 04/24/2019 with progression of right-sided weakness to flaccidity with difficulty walking as well as slurred speech.  MRI brain done showing significant interval increase in size of hemorrhagic left frontal lobe metastases with increased surrounding vasogenic edema and local mass-effect.  She was started on IV Decadron with serial CT of head for monitoring per neurology input.  Beryle Flock was discontinued and case was discussed with multidisciplinary cancer team.  Surgical resection for better disease control was recommended and scheduled for 07/26. Therapy has been ongoing during her stay and she was found to have mild expressive impairments with word finding deficits and paraphasias.  PT/OT has been working on bed mobility and standing attempts in steady.   She had generalized seizure activity on 07/24 and  Keppra was increased to 750 mg twice daily.  Repeat CT of head was stable without increase in size of hemorrhagic lesion.  EEG done showing intermittent slowing in left temporal region likely secondary to underlying hemorrhage and no seizures. She underwent stereotactic left frontal craniotomy for resection of tumor by Dr. Kathyrn Sheriff on 07/26.     Patient's medical record from [facility] has been reviewed by the rehabilitation admission coordinator and physician.   Past Medical History         Past Medical History:  Diagnosis Date   A-fib [facility])     Atrial fibrillation ([place])      4 years ago started   Brain cancer ([place]) 12/2020   Breast cancer ([place]) 2006   Colon polyps     Glaucoma     Hypercholesteremia     Hypertension      patient denies   Osteoporosis     Personal  history of chemotherapy 2006   Personal history of radiation therapy 2006   Stroke (cerebrum) [facility]) 2015      Family History   family history includes Breast cancer in her paternal aunt and paternal grandmother; Cirrhosis in her maternal grandfather and paternal grandfather; Hypertension in her sister; Hypotension in her mother; Kidney cancer in her brother; Lung cancer in her mother; Pancreatic cancer in her father.   Prior Rehab/Hospitalizations Has the patient had prior rehab or hospitalizations prior to admission? Yes   Has the patient had major surgery during 100 days prior to admission? Yes              Current Medications   Current Facility-Administered Medications:    stroke: mapping our early stages of recovery book, , Does not apply, Once, Derek Jack, MD   0.9 %  sodium chloride infusion, , Intravenous, Continuous, Pervis Hocking, DO, Last Rate: 10 mL/hr at 05/01/21 1303, New Bag at 05/01/21 1931   0.9 %  sodium chloride infusion, , Intravenous, Continuous, Consuella Lose, MD, Last Rate: 75 mL/hr at 05/01/21 2055, New Bag at 05/01/21 2055   acetaminophen (TYLENOL) tablet 650 mg,  650 mg, Oral, Q4H PRN, 650 mg at 05/03/21 0014 **OR** acetaminophen (TYLENOL) 160 MG/5ML solution 650 mg, 650 mg, Per Tube, Q4H PRN **OR** acetaminophen (TYLENOL) suppository 650 mg, 650 mg, Rectal, Q4H PRN, Derek Jack, MD   acyclovir (ZOVIRAX) tablet 400 mg, 400 mg, Oral, BID, Shadad, Mathis Dad, MD, 400 mg at 05/03/21 0935   atorvastatin (LIPITOR) tablet 10 mg, 10 mg, Oral, Daily, Derek Jack, MD, 10 mg at 05/03/21 5697   Chlorhexidine Gluconate Cloth 2 % PADS 6 each, 6 each, Topical, Daily, Derek Jack, MD, 6 each at 05/03/21 0936   dexamethasone (DECADRON) tablet 8 mg, 8 mg, Oral, Q12H, Derek Jack, MD, 8 mg at 05/03/21 0934   HYDROcodone-acetaminophen (NORCO/VICODIN) 5-325 MG per tablet 1 tablet, 1 tablet, Oral, Q4H PRN, Consuella Lose, MD, 1 tablet at 05/02/21 0015   labetalol (NORMODYNE) injection 10-40 mg, 10-40 mg, Intravenous, Q10 min PRN, Consuella Lose, MD   levETIRAcetam (KEPPRA) tablet 750 mg, 750 mg, Oral, BID, Consuella Lose, MD, 750 mg at 05/03/21 0935   metoprolol succinate (TOPROL-XL) 24 hr tablet 25 mg, 25 mg, Oral, BID, Derek Jack, MD, 25 mg at 05/03/21 0934   morphine 2 MG/ML injection 1-2 mg, 1-2 mg, Intravenous, Q2H PRN, Consuella Lose, MD   ondansetron (ZOFRAN) tablet 4 mg, 4 mg, Oral, Q4H PRN **OR** ondansetron (ZOFRAN) injection 4 mg, 4 mg, Intravenous, Q4H PRN, Consuella Lose, MD   pantoprazole (PROTONIX) EC tablet 40 mg, 40 mg, Oral, QHS, Henri Medal, RPH, 40 mg at 05/02/21 2128   promethazine (PHENERGAN) tablet 12.5-25 mg, 12.5-25 mg, Oral, Q4H PRN, Consuella Lose, MD   raloxifene (EVISTA) tablet 60 mg, 60 mg, Oral, Daily, Pahwani, Ravi, MD, 60 mg at 05/03/21 0935   senna-docusate (Senokot-S) tablet 1 tablet, 1 tablet, Oral, BID, Derek Jack, MD, 1 tablet at 05/03/21 0935   Patients Current Diet:  Diet Order                  Diet - low sodium heart healthy             Diet Carb Modified Fluid consistency: Thin;  Room service appropriate? Yes  Diet effective now  Precautions / Restrictions Precautions Precautions: Fall Precaution Comments: R hemiparesis Restrictions Weight Bearing Restrictions: No RLE Weight Bearing: Weight bearing as tolerated    Has the patient had 2 or more falls or a fall with injury in the past year? No   Prior Activity Level Limited Community (1-2x/wk): was independent until 2 weeks pta living alone   Prior Functional Level Self Care: Did the patient need help bathing, dressing, using the toilet or eating? Independent   Indoor Mobility: Did the patient need assistance with walking from room to room (with or without device)? Independent   Stairs: Did the patient need assistance with internal or external stairs (with or without device)? Independent   Functional Cognition: Did the patient need help planning regular tasks such as shopping or remembering to take medications? Independent   Home Assistive Devices / Equipment Home Assistive Devices/Equipment: Civil Service fast streamer Home Equipment: Grab bars - tub/shower, Shower seat, Radio producer - single point, Environmental consultant - 2 wheels, Transport chair   Prior Device Use: Indicate devices/aids used by the patient prior to current illness, exacerbation or injury? None of the above   Current Functional Level Cognition   Overall Cognitive Status: Impaired/Different from baseline Orientation Level: Oriented X4 Following Commands: Follows multi-step commands inconsistently General Comments: Pt with difficulty sequencing with verbal cues with novel tasks. Pt may require multimodal cues for novel tasks. Pt stating "I don't know."    Extremity Assessment (includes Sensation/Coordination)   Upper Extremity Assessment: RUE deficits/detail, LUE deficits/detail RUE Deficits / Details: R side hemiplegia; thumb activation flex and 3/5 hand squeeze x2 times. fatigues easily; otherwise, PROM, WFL RUE Sensation: WNL RUE  Coordination: decreased fine motor, decreased gross motor LUE Deficits / Details: strength 5/5. ROM, WNLs  Lower Extremity Assessment: RLE deficits/detail RLE Deficits / Details: sensation intact; no knee buckling in standing; no AROM noted     ADLs   Overall ADL's : Needs assistance/impaired Eating/Feeding: Minimal assistance, Sitting Grooming: Minimal assistance, Sitting Upper Body Bathing: Moderate assistance, Sitting Lower Body Bathing: Maximal assistance, Sit to/from stand, Bed level Upper Body Dressing : Moderate assistance, Sitting, Cueing for compensatory techniques, Cueing for sequencing Lower Body Dressing: Total assistance, Cueing for safety, Sitting/lateral leans, Sit to/from stand Lower Body Dressing Details (indicate cue type and reason): totalA for sock donning Toilet Transfer: Moderate assistance, +2 for physical assistance, +2 for safety/equipment, Stand-pivot Toilet Transfer Details (indicate cue type and reason): R knee blocked pivot from bed to recliner Toileting- Clothing Manipulation and Hygiene: Total assistance Functional mobility during ADLs: Moderate assistance, +2 for physical assistance, +2 for safety/equipment, Cueing for safety, Cueing for sequencing General ADL Comments: Pt severely limited with R sided hemiplegia. Pt reports "I was able to move my R leg before brain surgery yesterday and now I cannot.' Pt limited by decreased strength, decreased ability to care for self and decreased mobility.     Mobility   Overal bed mobility: Needs Assistance Bed Mobility: Supine to Sit Supine to sit: Mod assist, +2 for physical assistance, +2 for safety/equipment, HOB elevated Sit to supine: Max assist, +2 for physical assistance General bed mobility comments: modA +2 for stability of trunk and fluid movements of R side hemiplegia. Pt with difficulty following multi step commands     Transfers   Overall transfer level: Needs assistance Equipment used: 2 person hand  held assist Transfers: Sit to/from Stand, Stand Pivot Transfers Sit to Stand: Mod assist, +2 physical assistance, +2 safety/equipment Stand pivot transfers: +2 physical assistance, +2 safety/equipment, Max assist General  transfer comment: modA +2 for stability and to maneuver hemipleigc from bed to recliner.     Ambulation / Gait / Stairs / Wheelchair Mobility   Ambulation/Gait General Gait Details: Did not attempt     Posture / Balance Dynamic Sitting Balance Sitting balance - Comments: [email or web address] Balance Overall balance assessment: Needs assistance Sitting-balance support: Feet supported, No upper extremity supported Sitting balance-Leahy Scale: Fair Sitting balance - Comments: [email or web address] Postural control: Posterior lean Standing balance support: Bilateral upper extremity supported (RUE supported by therapist) Standing balance-Leahy Scale: Poor Standing balance comment: standing at EOB with R knee intermittently blocked.     Special needs/care consideration Seizure precautions    Previous Home Environment  Living Arrangements: Alone  Lives With: Alone Available Help at Discharge: Available 24 hours/day (son works form home; other family will assist) Type of Home: House Home Layout: Two level, Able to live on main level with bedroom/bathroom Alternate Level Stairs-Number of Steps: flight Home Access: Stairs to enter Entrance Stairs-Rails: Left, Right, Can reach both Entrance Stairs-Number of Steps: 4 Bathroom Shower/Tub: Multimedia programmer: Programmer, systems: Yes [place]: Yes   Discharge Living Setting Plans for Discharge Living Setting: Patient's home (son and family will provide 24/7 assist) Type of Home at Discharge: House Discharge Home Layout: Able to live on main level with bedroom/bathroom, Two level Alternate Level Stairs-Number of Steps: flight Discharge Home Access: Stairs to enter Entrance Stairs-Rails: Right, Left, Can reach  both Entrance Stairs-Number of Steps: 4 Discharge Bathroom Shower/Tub: Walk-in shower Discharge Bathroom Toilet: Standard Discharge Bathroom Accessibility: Yes How Accessible: Accessible via walker Does the patient have any problems obtaining your medications?: No   Social/Family/Support Systems Contact Information: son, Event organiser Anticipated Caregiver: Event organiser, brother and sister in law and sister Anticipated Caregiver's Contact Information: see above Ability/Limitations of Caregiver: son works from home Caregiver Availability: 24/7 Discharge Plan Discussed with Primary Caregiver: Yes Is Caregiver In Agreement with Plan?: Yes Does Caregiver/Family have Issues with Lodging/Transportation while Pt is in Rehab?: No   Goals Patient/Family Goal for Rehab: min assist with PT and OT, supervision with SLP Expected length of stay: ELOS 2 to 3 weeks Pt/Family Agrees to Admission and willing to participate: Yes Program Orientation Provided & Reviewed with Pt/Caregiver Including Roles  & Responsibilities: Yes   Decrease burden of Care through IP rehab admission: n/a   Possible need for SNF placement upon discharge: not anticipated   Patient Condition: I have reviewed medical records from [facility], spoken with CM, and patient, son, and family member. I met with patient at the bedside for inpatient rehabilitation assessment.  Patient will benefit from ongoing PT and OT, can actively participate in 3 hours of therapy a day 5 days of the week, and can make measurable gains during the admission.  Patient will also benefit from the coordinated team approach during an Inpatient Acute Rehabilitation admission.  The patient will receive intensive therapy as well as Rehabilitation physician, nursing, social worker, and care management interventions.  Due to bladder management, bowel management, safety, skin/wound care, disease management, medication administration, pain management, and patient education  the patient requires 24 hour a day rehabilitation nursing.  The patient is currently mod to max assist overall with mobility and basic ADLs.  Discharge setting and therapy post discharge at home with home health is anticipated.  Patient has agreed to participate in the Acute Inpatient Rehabilitation Program and will admit today.   Preadmission Screen Completed By:  Cleatrice Burke, 05/03/2021 11:57  AM ______________________________________________________________________   Discussed status with Dr. Dagoberto Ligas on  05/03/2021 at 1157 and received approval for admission today.   Admission Coordinator:  Cleatrice Burke, RN, time 3799 Date 05/03/2021    Assessment/Plan: Diagnosis: Does the need for close, 24 hr/day Medical supervision in concert with the patient's rehab needs make it unreasonable for this patient to be served in a less intensive setting? Yes Co-Morbidities requiring supervision/potential complications: R hemiplegia due to worsening L frontal lobe mets/hemorrhage- s/p crani with seizures and aphasia Due to bladder management, bowel management, safety, skin/wound care, disease management, medication administration, pain management, and patient education, does the patient require 24 hr/day rehab nursing? Yes Does the patient require coordinated care of a physician, rehab nurse, PT, OT, and SLP to address physical and functional deficits in the context of the above medical diagnosis(es)? Yes Addressing deficits in the following areas: balance, endurance, locomotion, strength, transferring, bowel/bladder control, bathing, dressing, feeding, grooming, toileting, cognition, speech, and language Can the patient actively participate in an intensive therapy program of at least 3 hrs of therapy 5 days a week? Yes The potential for patient to make measurable gains while on inpatient rehab is fair Anticipated functional outcomes upon discharge from inpatient rehab: min assist PT, min assist  OT, supervision and min assist SLP Estimated rehab length of stay to reach the above functional goals is: 2-3 weeks Anticipated discharge destination: Home 10. Overall Rehab/Functional Prognosis: fair     MD Signature:              Revision History                                                                     Note Details   Jan Fireman, MD File Time 05/03/2021 12:17 PM  Author Type Physician Status Signed  Last Editor Courtney Heys, MD Service Physical Medicine and [place] # [id number] Admit Date 05/03/2021           Revision History                        Note Details  Author Cristina Gong, RN File Time 05/04/2021  8:13 AM  Author Type Rehab Admission Coordinator Status Addendum  Last Editor Cristina Gong, RN Service Physical Medicine and [place] # [id number] Admit Date 05/03/2021

## 2021-05-04 NOTE — IPOC Note (Addendum)
Overall Plan of Care [facility]) Patient Details Name: Angela Carpenter MRN: [id number] DOB: 21-Mar-1941  Admitting Diagnosis: Metastatic cancer to brain [facility])  Hospital Problems: Principal Problem:   Metastatic cancer to brain [facility]) Active Problems:   Seizure disorder, focal motor ([place])   Right hemiplegia ([place])     Functional Problem List: Nursing Bladder, Behavior, Edema, Endurance, Medication Management, Pain, Safety, Skin Integrity  PT Balance, Endurance, Motor, Safety  OT Balance, Cognition, Endurance, Motor, Safety, Sensory, Skin Integrity  SLP Cognition, Endurance  TR         Basic ADL's: OT Grooming, Bathing, Dressing, Toileting     Advanced  ADL's: OT       Transfers: PT Bed Mobility, Bed to Chair, Car, Manufacturing systems engineer, Metallurgist: PT Ambulation, Emergency planning/management officer, Stairs     Additional Impairments: OT Fuctional Use of Upper Extremity  SLP Communication, Social Cognition expression, comprehension Problem Solving, Memory, Attention, Awareness  TR      Anticipated Outcomes Item Anticipated Outcome  Self Feeding S  Swallowing      Basic self-care  S UB; MIN A LB dressing  Toileting  MIN A   Bathroom Transfers MIN A  Bowel/Bladder  Min assist  Transfers  min assist  Locomotion  short distance HHA w/min to mod assist  Communication  supervision  Cognition  supervision  Pain  < 3  Safety/Judgment  Min assist and no falls   Therapy Plan: PT Intensity: Minimum of 1-2 x/day ,45 to 90 minutes PT Frequency: 5 out of 7 days PT Duration Estimated Length of Stay: 18-21 days OT Intensity: Minimum of 1-2 x/day, 45 to 90 minutes OT Frequency: 5 out of 7 days OT Duration/Estimated Length of Stay: 3 weeks SLP Intensity: Minumum of 1-2 x/day, 30 to 90 minutes SLP Frequency: 3 to 5 out of 7 days SLP Duration/Estimated Length of Stay: 2.5-3 wks   Due to the current state of emergency, patients may not be receiving their 3-hours of  Medicare-mandated therapy.   Team Interventions: Nursing Interventions Patient/Family Education, Bladder Management, Disease Management/Prevention, Pain Management, Medication Management, Skin Care/Wound Management, Discharge Planning, Psychosocial Support  PT interventions Ambulation/gait training, Discharge planning, Functional mobility training, Psychosocial support, Therapeutic Activities, Balance/vestibular training, Neuromuscular re-education, Therapeutic Exercise, Wheelchair propulsion/positioning, Cognitive remediation/compensation, DME/adaptive equipment instruction, Splinting/orthotics, UE/LE Strength taining/ROM, Community reintegration, Technical sales engineer stimulation, Patient/family education, IT trainer, UE/LE Coordination activities  OT Interventions Training and development officer, Discharge planning, Functional electrical stimulation, Pain management, Self Care/advanced ADL retraining, UE/LE Coordination activities, Therapeutic Activities, Cognitive remediation/compensation, Disease mangement/prevention, Functional mobility training, Patient/family education, Skin care/wound managment, Therapeutic Exercise, Visual/perceptual remediation/compensation, Wheelchair propulsion/positioning, UE/LE Strength taining/ROM, Splinting/orthotics, Psychosocial support, Neuromuscular re-education, DME/adaptive equipment instruction, Community reintegration  SLP Interventions Cognitive remediation/compensation, Environmental controls, Multimodal communication approach, Speech/Language facilitation, English as a second language teacher, Functional tasks, Therapeutic Activities, Internal/external aids, Patient/family education, Therapeutic Exercise  TR Interventions    SW/CM Interventions Discharge Planning, Psychosocial Support, Patient/Family Education   Barriers to Discharge MD  Medical stability  Nursing Decreased caregiver support, Home environment access/layout, Incontinence, Wound Care, Lack of/limited family  support, Weight bearing restrictions, Weight, Medication compliance, Behavior Lives alone, sone works from home and will be available 24 hrs. a day. Other family members will assist. Lives in a 2 level home but able to live on main level with bedroon/ bathroom. 4 steps to enter with left and right rails. Walk-in shower available.  PT Inaccessible home environment, Incontinence, Other (comments) nature of metastatic disease  OT Inaccessible home environment  SLP      SW       Team Discharge Planning: Destination: PT-Home ,OT- Home , SLP-Home Projected Follow-up: PT-Home health PT, OT-  Home health OT, SLP-Outpatient SLP Projected Equipment Needs: PT-To be determined, OT- 3 in 1 bedside comode, Tub/shower seat, To be determined, SLP-None recommended by SLP Equipment Details: PT-only has rollator which will not be appropriate for this pt, OT-  Patient/family involved in discharge planning: PT- Patient,  OT-Patient, SLP-Patient  MD ELOS: 2.5 to 3 weeks Medical Rehab Prognosis:  Excellent Assessment: The patient has been admitted for CIR therapies with the diagnosis of metastatic melanoma to brain s/p resection. The team will be addressing functional mobility, strength, stamina, balance, safety, adaptive techniques and equipment, self-care, bowel and bladder mgt, patient and caregiver education, NMR, visual-perceptual rx, cognition, community reentry. Goals have been set at supervision to min assist with mobility and self-care and supervision with cognition.   Due to the current state of emergency, patients may not be receiving their 3 hours per day of Medicare-mandated therapy.    [name], MD, FAAPMR     See Team Conference Notes for weekly updates to the plan of care

## 2021-05-04 NOTE — Progress Notes (Signed)
PROGRESS NOTE   Subjective/Complaints: Had a fair night. Denies headache. Frustrated that her right arm and leg are still very weak. Sitting EOB with OT when I entered room this morning.   ROS: Patient denies fever, rash, sore throat, blurred vision, nausea, vomiting, diarrhea, cough, shortness of breath or chest pain, joint or back pain, headache, or mood change.    Objective:   No results found. Recent Labs    05/04/21 0514  WBC 14.7*  HGB 12.0  HCT 35.0*  PLT 216   Recent Labs    05/04/21 0514  NA 133*  K 3.7  CL 102  CO2 27  GLUCOSE 177*  BUN 16  CREATININE 0.41*  CALCIUM 8.3*    Intake/Output Summary (Last 24 hours) at 05/04/2021 1238 Last data filed at 05/04/2021 0900 Gross per 24 hour  Intake 320 ml  Output 1475 ml  Net -1155 ml        Physical Exam: Vital Signs Blood pressure 107/61, pulse 63, temperature 97.9 F (36.6 C), temperature source Oral, resp. rate 16, height '5\' 5"'$  (1.651 m), weight 70.3 kg, SpO2 99 %.  General: Alert and oriented x 3, No apparent distress HEENT: Head is normocephalic, atraumatic, PERRLA, EOMI, sclera anicteric, oral mucosa pink and moist, dentition intact, ext ear canals clear,  Neck: Supple without JVD or lymphadenopathy Heart: Reg rate and rhythm. No murmurs rubs or gallops Chest: CTA bilaterally without wheezes, rales, or rhonchi; no distress Abdomen: Soft, non-tender, non-distended, bowel sounds positive. Extremities: No clubbing, cyanosis, or edema. Pulses are 2+ Psych: Pt's affect is appropriate. Pt is cooperative Skin: crani incision CDI with scab, dressing stapled to scalp. Neuro: Alert and oriented x 3. Normal insight and awareness. Intact Memory. Mild right central 7. No focal visual deficits. Speech somewhat fluent but has word finding deficits. RUE: ? Pecs but otherwise 0/5. RLE 0/5 prox to distal. LUE and LLE 4/5. Pt with good sitting balance. Only minimal  sensory difference to LT Left to Right. DTR's absent Musculoskeletal: Full ROM, No pain with PROM in the neck, trunk, or extremities. Posture appropriate. Right arm in sling   Assessment/Plan: 1. Functional deficits which require 3+ hours per day of interdisciplinary therapy in a comprehensive inpatient rehab setting. Physiatrist is providing close team supervision and 24 hour management of active medical problems listed below. Physiatrist and rehab team continue to assess barriers to discharge/monitor patient progress toward functional and medical goals  Care Tool:  Bathing    Body parts bathed by patient: Right arm, Chest, Abdomen, Right upper leg, Left upper leg, Face   Body parts bathed by helper: Left arm, Buttocks, Front perineal area, Right lower leg, Left lower leg Body parts n/a: Front perineal area   Bathing assist Assist Level: Maximal Assistance - Patient 24 - 49% (stedy for standing peri/buttock hygiene)     Upper Body Dressing/Undressing Upper body dressing   What is the patient wearing?: Pull over shirt, Orthosis (sling) Orthosis activity level: Performed by helper  Upper body assist Assist Level: Maximal Assistance - Patient 25 - 49%    Lower Body Dressing/Undressing Lower body dressing      What is the patient wearing?: Pants  Lower body assist Assist for lower body dressing: Total Assistance - Patient < 25%     Toileting Toileting    Toileting assist Assist for toileting: 2 Helpers (I/O CATH)     Transfers Chair/bed transfer  Transfers assist     Chair/bed transfer assist level: Dependent - mechanical lift     Locomotion Ambulation   Ambulation assist              Walk 10 feet activity   Assist           Walk 50 feet activity   Assist           Walk 150 feet activity   Assist           Walk 10 feet on uneven surface  activity   Assist           Wheelchair     Assist                Wheelchair 50 feet with 2 turns activity    Assist            Wheelchair 150 feet activity     Assist          Blood pressure 107/61, pulse 63, temperature 97.9 F (36.6 C), temperature source Oral, resp. rate 16, height '5\' 5"'$  (1.651 m), weight 70.3 kg, SpO2 99 %.  Medical Problem List and Plan: 1.  R hemiplegia secondary to Metastatic disease from melanoma s/p L frontal crani 05/01/21             -patient may  shower             -ELOS/Goals: 2-3 weeks - goals min A 2.  Antithrombotics: -DVT/anticoagulation:  Mechanical: Sequential compression devices, below knee Bilateral lower extremities--screening dopplers pending             -antiplatelet therapy: N/A 3. Pain Management: Tylenol prn.  4. Mood: LCSW to follow for evaluation and support.              -antipsychotic agents: N/A 5. Neuropsych: This patient is capable of making decisions on her own behalf. 6. Skin/Wound Care:  Routine pressure relief measures.  7. Fluids/Electrolytes/Nutrition: encourage PO  -intake sporadic so far  -she's -1100cc since admit  -I personally reviewed the patient's labs today.    -protein supp for albumin 8. Paroxymal A fib:/HTN: Monitor BP tid--continue metoprolol daily.             --Off Eliquis due to hemorrhagic 9. Glaucoma: Has been stable on timoptic--will resume.  10. Pre-diabetes: Hgb A1C-6.1. Diet liberalized to regular per patient/family request.             --will monitor FBS weekly 11.  Leucocytosis: Likely due to steroid effect and improving             --no fevers or other signs of infection.  -14.7 today 12. Hyponatremia: Sodium up to 133 today, f/u next week.  13. Urinary retention: volumes from I/C 500-900 cc so far  -begin trial of urecholine  -not sure bp can tolerate flomax  -OOB to toilet/bsc to void  -check ua, ucx LOS: 1 days A FACE TO FACE EVALUATION WAS PERFORMED  [name] 05/04/2021, 12:38 PM

## 2021-05-05 ENCOUNTER — Inpatient Hospital Stay (HOSPITAL_COMMUNITY): Payer: Medicare Other

## 2021-05-05 DIAGNOSIS — C7931 Secondary malignant neoplasm of brain: Secondary | ICD-10-CM

## 2021-05-05 DIAGNOSIS — R609 Edema, unspecified: Secondary | ICD-10-CM

## 2021-05-05 LAB — URINALYSIS, COMPLETE (UACMP) WITH MICROSCOPIC
Bacteria, UA: NONE SEEN
Bilirubin Urine: NEGATIVE
Glucose, UA: NEGATIVE mg/dL
Hgb urine dipstick: NEGATIVE
Ketones, ur: NEGATIVE mg/dL
Leukocytes,Ua: NEGATIVE
Nitrite: NEGATIVE
Protein, ur: NEGATIVE mg/dL
Specific Gravity, Urine: 1.011 (ref 1.005–1.030)
pH: 7 (ref 5.0–8.0)

## 2021-05-05 MED ORDER — PROSIGHT PO TABS
1.0000 | ORAL_TABLET | Freq: Two times a day (BID) | ORAL | Status: DC
  Administered 2021-05-05 – 2021-05-29 (×49): 1 via ORAL
  Filled 2021-05-05 (×49): qty 1

## 2021-05-05 MED ORDER — SORBITOL 70 % SOLN
15.0000 mL | Freq: Once | Status: AC
  Administered 2021-05-05: 15 mL via ORAL
  Filled 2021-05-05: qty 30

## 2021-05-05 NOTE — Progress Notes (Signed)
PROGRESS NOTE   Subjective/Complaints: C/o constipation- last BM Tuesday. Discussed sorbitol today and she is agreeable Denies pain C/o urinary retention. UA negative. Discussed could be related to her constipation  ROS: Patient denies fever, rash, sore throat, blurred vision, nausea, vomiting, diarrhea, cough, shortness of breath or chest pain, joint or back pain, headache, or mood change. +constipation   Objective:   No results found. Recent Labs    05/04/21 0514  WBC 14.7*  HGB 12.0  HCT 35.0*  PLT 216   Recent Labs    05/04/21 0514  NA 133*  K 3.7  CL 102  CO2 27  GLUCOSE 177*  BUN 16  CREATININE 0.41*  CALCIUM 8.3*    Intake/Output Summary (Last 24 hours) at 05/05/2021 1439 Last data filed at 05/05/2021 1300 Gross per 24 hour  Intake 840 ml  Output 2300 ml  Net -1460 ml        Physical Exam: Vital Signs Blood pressure (!) 110/56, pulse 72, temperature (!) 97.4 F (36.3 C), temperature source Oral, resp. rate 17, height '5\' 5"'$  (1.651 m), weight 70.3 kg, SpO2 99 %. Gen: no distress, normal appearing HEENT: oral mucosa pink and moist, NCAT Cardio: Reg rate Chest: normal effort, normal rate of breathing Abd: soft, non-distended Extremities: No clubbing, cyanosis, or edema. Pulses are 2+ Psych: Pt's affect is appropriate. Pt is cooperative Skin: crani incision CDI with scab, dressing stapled to scalp. Neuro: Alert and oriented x 3. Normal insight and awareness. Intact Memory. Mild right central 7. No focal visual deficits. Speech somewhat fluent but has word finding deficits. RUE: ? Pecs but otherwise 0/5. RLE 0/5 prox to distal. LUE and LLE 4/5. Pt with good sitting balance. Only minimal sensory difference to LT Left to Right. DTR's absent Musculoskeletal: Full ROM, No pain with PROM in the neck, trunk, or extremities. Posture appropriate. Right arm in sling   Assessment/Plan: 1. Functional deficits  which require 3+ hours per day of interdisciplinary therapy in a comprehensive inpatient rehab setting. Physiatrist is providing close team supervision and 24 hour management of active medical problems listed below. Physiatrist and rehab team continue to assess barriers to discharge/monitor patient progress toward functional and medical goals  Care Tool:  Bathing    Body parts bathed by patient: Right arm, Chest, Abdomen, Right upper leg, Left upper leg, Face   Body parts bathed by helper: Left arm, Buttocks, Front perineal area, Right lower leg, Left lower leg Body parts n/a: Front perineal area   Bathing assist Assist Level: Maximal Assistance - Patient 24 - 49%     Upper Body Dressing/Undressing Upper body dressing   What is the patient wearing?: Pull over shirt, Orthosis (sling) Orthosis activity level: Performed by helper  Upper body assist Assist Level: Maximal Assistance - Patient 25 - 49%    Lower Body Dressing/Undressing Lower body dressing      What is the patient wearing?: Pants     Lower body assist Assist for lower body dressing: Total Assistance - Patient < 25%     Toileting Toileting    Toileting assist Assist for toileting: 2 Helpers     Transfers Chair/bed transfer  Transfers assist  Chair/bed transfer assist level: Maximal Assistance - Patient 25 - 49%     Locomotion Ambulation   Ambulation assist   Ambulation activity did not occur: Safety/medical concerns          Walk 10 feet activity   Assist  Walk 10 feet activity did not occur: Safety/medical concerns        Walk 50 feet activity   Assist Walk 50 feet with 2 turns activity did not occur: Safety/medical concerns         Walk 150 feet activity   Assist Walk 150 feet activity did not occur: Safety/medical concerns         Walk 10 feet on uneven surface  activity   Assist Walk 10 feet on uneven surfaces activity did not occur: Safety/medical  concerns         Wheelchair     Assist Will patient use wheelchair at discharge?: Yes Type of Wheelchair: Manual    Wheelchair assist level: Moderate Assistance - Patient 50 - 74% Max wheelchair distance: 50    Wheelchair 50 feet with 2 turns activity    Assist        Assist Level: Moderate Assistance - Patient 50 - 74%   Wheelchair 150 feet activity     Assist  Wheelchair 150 feet activity did not occur: Safety/medical concerns       Blood pressure (!) 110/56, pulse 72, temperature (!) 97.4 F (36.3 C), temperature source Oral, resp. rate 17, height '5\' 5"'$  (1.651 m), weight 70.3 kg, SpO2 99 %.  Medical Problem List and Plan: 1.  R hemiplegia secondary to Metastatic disease from melanoma s/p L frontal crani 05/01/21             -patient may  shower             -ELOS/Goals: 2-3 weeks - goals min A 2.  Age indeterminate DVT in right popliteal vein, and in bilateral posterior tibial and peroneal veins. Chronic DVT in right common femoral and femoral veins. Given age indeterminate DVTs and history of ICH, I will not anticoagulate at this time. Continue SCDs and repeat dopplers next week             -antiplatelet therapy: N/A 3. Pain Management: Tylenol prn.  4. Mood: LCSW to follow for evaluation and support.              -antipsychotic agents: N/A 5. Neuropsych: This patient is capable of making decisions on her own behalf. 6. Skin/Wound Care:  Routine pressure relief measures.  7. Fluids/Electrolytes/Nutrition: encourage PO  -intake sporadic so far  -she's -1100cc since admit  -I personally reviewed the patient's labs today.    -protein supp for albumin 8. Paroxymal A fib:/HTN: Monitor BP tid--continue metoprolol daily.             --Off Eliquis due to hemorrhagic 9. Glaucoma: Has been stable on timoptic--will resume.  10. Pre-diabetes: Hgb A1C-6.1. Diet liberalized to regular per patient/family request.             --will monitor FBS weekly 11.   Leucocytosis: Likely due to steroid effect and improving             --no fevers or other signs of infection.  -14.7 today 12. Hyponatremia: Sodium up to 133 today, f/u next week.  13. Urinary retention: volumes from I/C 500-900 cc so far  -begin trial of urecholine  -not sure bp can tolerate flomax  -OOB to toilet/bsc to  void  -UA negative, f/u UC. Discussed could be related to her constipation.  14. Constipation: sorbitol 15 today. LOS: 2 days A FACE TO FACE EVALUATION WAS PERFORMED  Izora Ribas 05/05/2021, 2:39 PM

## 2021-05-05 NOTE — Progress Notes (Signed)
VASCULAR LAB    Bilateral lower extremity venous duplex has been performed.  See CV proc for preliminary results.  Messaged results to  Dr. Ranell Patrick via secure chat.  Seanpatrick Maisano, RVT 05/05/2021, 2:41 PM

## 2021-05-05 NOTE — Plan of Care (Signed)
  Problem: Consults Goal: RH BRAIN INJURY PATIENT EDUCATION Description: Description: See Patient Education module for eduction specifics Outcome: Progressing Goal: Skin Care Protocol Initiated - if Braden Score 18 or less Description: If consults are not indicated, leave blank or document N/A Outcome: Progressing   Problem: RH BLADDER ELIMINATION Goal: RH STG MANAGE BLADDER WITH ASSISTANCE Description: STG Manage Bladder With Min Assistance Outcome: Progressing   Problem: RH SAFETY Goal: RH STG ADHERE TO SAFETY PRECAUTIONS W/ASSISTANCE/DEVICE Description: STG Adhere to Safety Precautions With Min Assistance/Device. Outcome: Progressing Goal: RH STG DECREASED RISK OF FALL WITH ASSISTANCE Description: STG Decreased Risk of Fall With Cues and Reminders. Outcome: Progressing   Problem: RH COGNITION-NURSING Goal: RH STG USES MEMORY AIDS/STRATEGIES W/ASSIST TO PROBLEM SOLVE Description: STG Uses Memory Aids/Strategies With Cues and Reminders to Problem Solve. Outcome: Progressing   Problem: RH PAIN MANAGEMENT Goal: RH STG PAIN MANAGED AT OR BELOW PT'S PAIN GOAL Description: < 3 on a 0-10 pain scale. Outcome: Progressing

## 2021-05-05 NOTE — Progress Notes (Signed)
Patient request to eat breakfast before being  cath, bladder scan volume was low, patient states she had not had  any po's intake since last I/O cath and need to eat . On-coming nurse informed and acknowledge she will get it done.

## 2021-05-05 NOTE — Progress Notes (Addendum)
Cath.Urine for routine and culture sent to lab per orders

## 2021-05-06 DIAGNOSIS — C7931 Secondary malignant neoplasm of brain: Secondary | ICD-10-CM | POA: Diagnosis not present

## 2021-05-06 LAB — URINE CULTURE: Culture: NO GROWTH

## 2021-05-06 MED ORDER — ENOXAPARIN SODIUM 40 MG/0.4ML IJ SOSY
40.0000 mg | PREFILLED_SYRINGE | INTRAMUSCULAR | Status: DC
  Administered 2021-05-06 – 2021-05-28 (×23): 40 mg via SUBCUTANEOUS
  Filled 2021-05-06 (×23): qty 0.4

## 2021-05-06 MED ORDER — SORBITOL 70 % SOLN
30.0000 mL | Freq: Once | Status: AC
  Administered 2021-05-06: 30 mL via ORAL
  Filled 2021-05-06: qty 30

## 2021-05-06 NOTE — Progress Notes (Addendum)
PROGRESS NOTE   Subjective/Complaints: Discussed with patient and sister-in-law age indeterminate DVTs in her lower extremities. Discussed with oncology who recommended starting DVT prophylaxis. Eliquis can be restarted in future.   ROS: Patient denies fever, rash, sore throat, blurred vision, nausea, vomiting, diarrhea, cough, shortness of breath or chest pain, joint or back pain, headache, or mood change. +constipation   Objective:   VAS [place] LOWER EXTREMITY VENOUS (DVT)  Result Date: 05/05/2021  Lower Venous DVT Study Patient Name:  Angela Carpenter  Date of Exam:   05/05/2021 Medical Rec #: [id number]      Accession #:    [id number] Date of Birth: 08-21-1941     Patient Gender: F Patient Age:   65Y Exam Location:  [facility] Procedure:      VAS [place] LOWER EXTREMITY VENOUS (DVT) Referring Phys: [place] --------------------------------------------------------------------------------  Indications: Edema, and Metastatic brain cancer, rehabilitation.  Anticoagulation: Patient was on Eliquis for atrial fibrillation but it was discontinued in March 2022 secondary to brain mass. Comparison Study: No prior study Performing Technologist: Sharion Dove RVS  Examination Guidelines: A complete evaluation includes B-mode imaging, spectral Doppler, color Doppler, and power Doppler as needed of all accessible portions of each vessel. Bilateral testing is considered an integral part of a complete examination. Limited examinations for reoccurring indications may be performed as noted. The reflux portion of the exam is performed with the patient in reverse Trendelenburg.  +---------+---------------+---------+-----------+----------+-----------------+ RIGHT    CompressibilityPhasicitySpontaneityPropertiesThrombus Aging    +---------+---------------+---------+-----------+----------+-----------------+ CFV      Partial        Yes      Yes                   Chronic           +---------+---------------+---------+-----------+----------+-----------------+ SFJ      Full                                                           +---------+---------------+---------+-----------+----------+-----------------+ FV Prox  Partial                                      Chronic           +---------+---------------+---------+-----------+----------+-----------------+ FV Mid   Partial                                      Chronic           +---------+---------------+---------+-----------+----------+-----------------+ FV DistalFull                                                           +---------+---------------+---------+-----------+----------+-----------------+  PFV      Full                                                           +---------+---------------+---------+-----------+----------+-----------------+ POP      None                                         Age Indeterminate +---------+---------------+---------+-----------+----------+-----------------+ PTV      None                                         Age Indeterminate +---------+---------------+---------+-----------+----------+-----------------+ PERO     None                                         Age Indeterminate +---------+---------------+---------+-----------+----------+-----------------+   +---------+---------------+---------+-----------+----------+-----------------+ LEFT     CompressibilityPhasicitySpontaneityPropertiesThrombus Aging    +---------+---------------+---------+-----------+----------+-----------------+ CFV      Full           Yes      Yes                                    +---------+---------------+---------+-----------+----------+-----------------+ SFJ      Full                                                           +---------+---------------+---------+-----------+----------+-----------------+ FV Prox  Full                                                            +---------+---------------+---------+-----------+----------+-----------------+ FV Mid   Full                                                           +---------+---------------+---------+-----------+----------+-----------------+ FV DistalFull                                                           +---------+---------------+---------+-----------+----------+-----------------+ PFV      Full                                                           +---------+---------------+---------+-----------+----------+-----------------+  POP      Full           Yes      Yes                                    +---------+---------------+---------+-----------+----------+-----------------+ PTV      Partial                                      Age Indeterminate +---------+---------------+---------+-----------+----------+-----------------+ PERO     Partial                                      Age Indeterminate +---------+---------------+---------+-----------+----------+-----------------+     Summary: RIGHT: - Findings consistent with age indeterminate deep vein thrombosis involving the right popliteal vein, right posterior tibial veins, and right peroneal veins. - Findings consistent with chronic deep vein thrombosis involving the right common femoral vein, and right femoral vein.  LEFT: - Findings consistent with age indeterminate deep vein thrombosis involving the left posterior tibial veins, and left peroneal veins.  *See table(s) above for measurements and observations. Electronically signed by Deitra Mayo MD on 05/05/2021 at 6:44:38 PM.    Final    Recent Labs    05/04/21 0514  WBC 14.7*  HGB 12.0  HCT 35.0*  PLT 216   Recent Labs    05/04/21 0514  NA 133*  K 3.7  CL 102  CO2 27  GLUCOSE 177*  BUN 16  CREATININE 0.41*  CALCIUM 8.3*    Intake/Output Summary (Last 24 hours) at 05/06/2021 1033 Last data  filed at 05/06/2021 0815 Gross per 24 hour  Intake 1080 ml  Output 1800 ml  Net -720 ml        Physical Exam: Vital Signs Blood pressure 130/66, pulse 63, temperature 97.6 F (36.4 C), temperature source Oral, resp. rate 18, height '5\' 5"'$  (1.651 m), weight 70.3 kg, SpO2 99 %. Gen: no distress, normal appearing HEENT: oral mucosa pink and moist, NCAT Cardio: Reg rate Chest: normal effort, normal rate of breathing Abd: soft, non-distended Extremities: No clubbing, cyanosis, or edema. Pulses are 2+ Psych: Pt's affect is appropriate. Pt is cooperative Skin: crani incision CDI with scab, dressing stapled to scalp. Neuro: Alert and oriented x 3. Normal insight and awareness. Intact Memory. Mild right central 7. No focal visual deficits. Speech somewhat fluent but has word finding deficits. RUE: ? Pecs but otherwise 0/5. RLE 0/5 prox to distal. LUE and LLE 4/5. Pt with good sitting balance. Only minimal sensory difference to LT Left to Right. DTR's absent Musculoskeletal: Full ROM, No pain with PROM in the neck, trunk, or extremities. Posture appropriate. Right arm in sling   Assessment/Plan: 1. Functional deficits which require 3+ hours per day of interdisciplinary therapy in a comprehensive inpatient rehab setting. Physiatrist is providing close team supervision and 24 hour management of active medical problems listed below. Physiatrist and rehab team continue to assess barriers to discharge/monitor patient progress toward functional and medical goals  Care Tool:  Bathing    Body parts bathed by patient: Right arm, Chest, Abdomen, Right upper leg, Left upper leg, Face   Body parts bathed by helper: Left arm, Buttocks, Front perineal area, Right lower leg, Left lower leg Body parts n/a:  Front perineal area   Bathing assist Assist Level: Maximal Assistance - Patient 24 - 49%     Upper Body Dressing/Undressing Upper body dressing   What is the patient wearing?: Pull over  shirt Orthosis activity level: Performed by helper  Upper body assist Assist Level: Moderate Assistance - Patient 50 - 74%    Lower Body Dressing/Undressing Lower body dressing      What is the patient wearing?: Incontinence brief     Lower body assist Assist for lower body dressing: Maximal Assistance - Patient 25 - 49%     Toileting Toileting    Toileting assist Assist for toileting: 2 Helpers     Transfers Chair/bed transfer  Transfers assist     Chair/bed transfer assist level: Maximal Assistance - Patient 25 - 49%     Locomotion Ambulation   Ambulation assist   Ambulation activity did not occur: Safety/medical concerns          Walk 10 feet activity   Assist  Walk 10 feet activity did not occur: Safety/medical concerns        Walk 50 feet activity   Assist Walk 50 feet with 2 turns activity did not occur: Safety/medical concerns         Walk 150 feet activity   Assist Walk 150 feet activity did not occur: Safety/medical concerns         Walk 10 feet on uneven surface  activity   Assist Walk 10 feet on uneven surfaces activity did not occur: Safety/medical concerns         Wheelchair     Assist Will patient use wheelchair at discharge?: Yes Type of Wheelchair: Manual    Wheelchair assist level: Moderate Assistance - Patient 50 - 74% Max wheelchair distance: 50    Wheelchair 50 feet with 2 turns activity    Assist        Assist Level: Moderate Assistance - Patient 50 - 74%   Wheelchair 150 feet activity     Assist  Wheelchair 150 feet activity did not occur: Safety/medical concerns       Blood pressure 130/66, pulse 63, temperature 97.6 F (36.4 C), temperature source Oral, resp. rate 18, height '5\' 5"'$  (1.651 m), weight 70.3 kg, SpO2 99 %.  Medical Problem List and Plan: 1.  R hemiplegia secondary to Metastatic disease from melanoma s/p L frontal crani 05/01/21             -patient may  shower              -ELOS/Goals: 2-3 weeks - goals min A 2.  Age indeterminate DVT in right popliteal vein, and in bilateral posterior tibial and peroneal veins. Chronic DVT in right common femoral and femoral veins. Given age indeterminate DVTs and history of ICH, I will not anticoagulate at this time. Continue SCDs and repeat dopplers next week. Discussed with Dr. Julien Nordmann who recommends prophylactic Lovenox- no IVC filter- resumption of Eliquis in a few weeks. NSGY ok'ed starting prophylactic Lovenox. Discussed plan with patient.              -antiplatelet therapy: N/A 3. Pain Management: Tylenol prn.  4. Mood: LCSW to follow for evaluation and support.              -antipsychotic agents: N/A 5. Neuropsych: This patient is capable of making decisions on her own behalf. 6. Skin/Wound Care:  Routine pressure relief measures.  7. Fluids/Electrolytes/Nutrition: encourage PO  -intake sporadic so far  -  she's -1100cc since admit  -I personally reviewed the patient's labs today.    -protein supp for albumin 8. Paroxymal A fib:/HTN: Monitor BP tid--continue metoprolol daily.             --Off Eliquis due to hemorrhagic 9. Glaucoma: Has been stable on timoptic--will resume.  10. Pre-diabetes: Hgb A1C-6.1. Diet liberalized to regular per patient/family request.             --will monitor FBS weekly 11.  Leucocytosis: Likely due to steroid effect and improving             --no fevers or other signs of infection.  -14.7 today 12. Hyponatremia: Sodium up to 133 today, f/u next week.  13. Urinary retention: volumes from I/C 500-900 cc so far  -begin trial of urecholine  -not sure bp can tolerate flomax  -OOB to toilet/bsc to void  -UA negative, f/u UC. Discussed could be related to her constipation.  14. Constipation: sorbitol 30 an prune juice today. Provided list of foods for constipation and sister-in-law has brought in some foods from home  >35 minutes spent in discussion of DVTs and patient's history with  patient, Dr. Julien Nordmann (oncology), NSGY, starting Lovenox, discussing her constipation, providing list of foods for constipation, ordering sorbitol.  LOS: 3 days A FACE TO FACE EVALUATION WAS PERFORMED  Clide Deutscher Judaea Burgoon 05/06/2021, 10:33 AM

## 2021-05-06 NOTE — Progress Notes (Signed)
Occupational Therapy Session Note  Patient Details  Name: Angela Carpenter MRN: [id number] Date of Birth: [birth year]  Today's Date: 05/06/2021 OT Individual Time: 0700-0800 OT Individual Time Calculation (min): 60 min    Short Term Goals: Week 1:  OT Short Term Goal 1 (Week 1): Pt will don shirt wiht MOD A OT Short Term Goal 2 (Week 1): Pt will recall hemi strategies wiht MIN VC OT Short Term Goal 3 (Week 1): Pt will thread 1LE into pants OT Short Term Goal 4 (Week 1): Pt will groom in unsupported sitting wiht MIN A  Skilled Therapeutic Interventions/Progress Updates:     Pt received in bed with no pain. Envelope sling donned for transfers to decrease risk of increase shoulder sublux ADL: Increased adductor activation during MAX A bed mobility to EOB from [facility] elevated/supine Pt completes bathing with seated on BSC in shower with A to wash buttocks in standing in stedy, B feet and HOH A to wash L arm Pt completes UB dressing with MAX A with VC for hemi dressing strategies to don pullover shirt perched in stedy Pt completes LB dressing with MAX A sit to stand in stedy with MIN A for power up/abducting RLE Pt completes footwear with total A for donning non skid socks Pt completes toileting with MAX A for components sit to stand level in stedy with MIN A for power up/A for balance with stedy bar. Increased time to attempt to void bowel or bladder but no success over toilet. BSC utilized in shower in case bowel or bladder occurred. Pt completes toileting/shower transfer with MIN A STS In stedy  Pt left at end of session in w/c with exit alarm on, call light in reach and all needs met   Therapy Documentation Precautions:  Precautions Precautions: Fall Precaution Comments: R hemiparesis Required Braces or Orthoses: Sling Restrictions Weight Bearing Restrictions: No RLE Weight Bearing: Weight bearing as tolerated General:   Vital Signs: Therapy Vitals Temp: 97.6 F (36.4 C) Temp  Source: Oral Pulse Rate: 63 Resp: 18 BP: 130/66 Oxygen Therapy SpO2: 99 % Pain:   ADL: ADL Grooming: Moderate assistance Where Assessed-Grooming: Edge of bed Upper Body Bathing: Moderate assistance Where Assessed-Upper Body Bathing: Edge of bed Lower Body Bathing: Maximal assistance Where Assessed-Lower Body Bathing: Edge of bed Upper Body Dressing: Maximal assistance Where Assessed-Upper Body Dressing: Edge of bed Lower Body Dressing: Maximal assistance Where Assessed-Lower Body Dressing: Edge of bed Toileting: Maximal assistance Where Assessed-Toileting: Bedside Commode (stedy) Toilet Transfer: Maximal verbal cueing Toilet Transfer Method: Other (comment) (stedy) Tub/Shower Transfer: Not assessed Vision   Perception    Praxis   Exercises:   Other Treatments:     Therapy/Group: Individual Therapy  Tonny Branch 05/06/2021, 6:50 AM

## 2021-05-06 NOTE — Progress Notes (Signed)
Physical Therapy Session Note  Patient Details  Name: Angela Carpenter MRN: [id number] Date of Birth: [birth year]  Today's Date: 05/06/2021 PT Individual Time: 0905-1005 and 1302-1335 PT Individual Time Calculation (min): 60 min and 33 min  Short Term Goals: Week 1:  PT Short Term Goal 1 (Week 1): Pt will perform squat pivot transfer to/from wc to L side w/mod assist of 1 PT Short Term Goal 2 (Week 1): pt will propel wc using hemi technique x 116f w/additional time and supervision PT Short Term Goal 3 (Week 1): pt will stand statically w/LUE support and min assist x 2 min LRAD/use of RLE blocking/bracing as needed PT Short Term Goal 4 (Week 1): supine to side to sit w/cga only  Skilled Therapeutic Interventions/Progress Updates:     Session 1: Patient in w/c in the room with her daughter stepping out upon PT arrival. Patient alert and agreeable to PT session. Patient denied pain during session.  Therapeutic Activity: Transfers: Patient performed sit to/from stand x3 in the // bars and x5 using a L rail with mod progressing to min A wit PT blocking R knee. Provided verbal cues for scooting forward, forward weight shift, and gluteal activation for hip/trunk extension.  Gait Training:  Patient ambulated 10 feet forwards and 7 feet backwards then 30 feet forwards using a L rail with mod progressing to min A for balance and lateral weight shifting, total A progressing to max A for R limb advancement and max A progressing to CGA for blocking R knee in stance. Provided mod multimodal cues for sequencing and erect posture and looking ahead.  Wheelchair Mobility:  Patient was transported in the w/c with total A throughout session for energy conservation and time management.  Neuromuscular Re-ed: Patient performed the following standing balance and pre-gait activities for improved R hemi-body motor control: -standing balance 2x2-3 min with B upper extremity support (total A to place R hand on //  bar), focused on equal weight bearing and B gluteal and quad activation focused on R with mirror for visual feedback, progressed from min A-CGA for standing balance -standing in // bars as above mini squats with min/mod A for R lower extremity control and blocking R knee -standing in // bars as above weight shifts progressing to alternating stepping with max A for weight shift and total A for R limb advancement  Patient in w/c in the room at end of session with breaks locked, seat belt alarm set, and all needs within reach.   Session 2: Patient in w/c with her sister and son in the room upon PT arrival. Patient alert and agreeable to PT session. Patient denied pain during session.  Patient's family with questions regarding patient dressing on her head, differed to RN, and dressing orders are not written out in the chart. Dressing with dried drainage, but otherwise clean and dry, noted dressing to be stapled to patient's head. Informed family that the dressing may be changed with the staples are removed. RN made aware and to follow-up with family.   Patient reported tingling in B bottoms of her feet. States that this started many months ago, only when she would have her legs elevated for increased time and progressed to constant tingling PTA. States that she has not informed her physicians and has had no change in tingling since surgery.   Performed seated PROM of R upper extremity: wrist flex/ext, finger flex/ext, elbow flex/ext, shoulder flex/ext and abduction to 90 deg.   Therapeutic Activity: Bed Mobility:  Patient performed sit to supine with mod A for trunk and lower extremity management. Provided verbal cues and hand over hand assist for coming down to her R elbow then to side-lying, and cues for bringing knees to chest to lift her legs onto the bed. Patient performed scooting up in the bed with min A with bed in trendelenburg and increased time for positioning with pillows behind her back and  head and elevating her R arm and B lower extremities. Transfers: Patient performed squat pivot w/c>bed with mod A with PT blocking R knee to prevent buckling. Provided verbal cues for w/c set-up and forward weight shift.  Patient in bed with her family at bedside at end of session with breaks locked, bed alarm set, and all needs within reach.   Therapy Documentation Precautions:  Precautions Precautions: Fall Precaution Comments: R hemiparesis Required Braces or Orthoses: Sling Restrictions Weight Bearing Restrictions: No RLE Weight Bearing: Weight bearing as tolerated    Therapy/Group: Individual Therapy  Jochebed Bills L Robertlee Rogacki PT, DPT  05/06/2021, 12:50 PM

## 2021-05-06 NOTE — Progress Notes (Signed)
Speech Language Pathology Daily Session Note  Patient Details  Name: ESTELL STUMPP MRN: [id number] Date of Birth: 09-16-1941  Today's Date: 05/06/2021 SLP Individual Time: 1405-1430 SLP Individual Time Calculation (min): 25 min  Short Term Goals: Week 1: SLP Short Term Goal 1 (Week 1): Pt will demonstrate funtional problem solving skills for safety awareness given min verbal cueing. SLP Short Term Goal 2 (Week 1): Pt will demonstrate use of word finding strategies to decrease anomia in conversation given min verbal cueing. SLP Short Term Goal 3 (Week 1): Pt will follow complex 2-step directions given min verbal cueing. SLP Short Term Goal 4 (Week 1): Pt will complete medication and financial management for complex problem solving with mod verbal cueing.  Skilled Therapeutic Interventions:   Pt was seen for skilled ST targeting goals for word finding.  Session began late due to cathing needs indicated during bladder scan.  During functional conversations pt self corrected moments of anomia with mod I/increased time.  Pt reports that she feels her word finding is greatly improved and that she has always had difficulty with mixing up opposite pairs (ie she frequently substitutes left and right, etc.).  Pt was left in bed with bed alarm set and call bell within reach.  Continue per current plan of care.    Pain Pain Assessment Pain Scale: 0-10 Pain Score: 0-No pain  Therapy/Group: Individual Therapy  Derrek Puff, Selinda Orion 05/06/2021, 4:07 PM

## 2021-05-06 NOTE — Plan of Care (Signed)
  Problem: Consults Goal: RH BRAIN INJURY PATIENT EDUCATION Description: Description: See Patient Education module for eduction specifics Outcome: Progressing Goal: Skin Care Protocol Initiated - if Braden Score 18 or less Description: If consults are not indicated, leave blank or document N/A Outcome: Progressing   Problem: RH BLADDER ELIMINATION Goal: RH STG MANAGE BLADDER WITH ASSISTANCE Description: STG Manage Bladder With Min Assistance Outcome: Progressing   Problem: RH SAFETY Goal: RH STG ADHERE TO SAFETY PRECAUTIONS W/ASSISTANCE/DEVICE Description: STG Adhere to Safety Precautions With Min Assistance/Device. Outcome: Progressing Goal: RH STG DECREASED RISK OF FALL WITH ASSISTANCE Description: STG Decreased Risk of Fall With Cues and Reminders. Outcome: Progressing   Problem: RH COGNITION-NURSING Goal: RH STG USES MEMORY AIDS/STRATEGIES W/ASSIST TO PROBLEM SOLVE Description: STG Uses Memory Aids/Strategies With Cues and Reminders to Problem Solve. Outcome: Progressing   Problem: RH PAIN MANAGEMENT Goal: RH STG PAIN MANAGED AT OR BELOW PT'S PAIN GOAL Description: < 3 on a 0-10 pain scale. Outcome: Progressing

## 2021-05-07 ENCOUNTER — Inpatient Hospital Stay: Payer: Medicare Other

## 2021-05-07 DIAGNOSIS — E871 Hypo-osmolality and hyponatremia: Secondary | ICD-10-CM | POA: Diagnosis not present

## 2021-05-07 DIAGNOSIS — C7931 Secondary malignant neoplasm of brain: Secondary | ICD-10-CM | POA: Diagnosis not present

## 2021-05-07 DIAGNOSIS — G8191 Hemiplegia, unspecified affecting right dominant side: Secondary | ICD-10-CM | POA: Diagnosis not present

## 2021-05-07 DIAGNOSIS — R339 Retention of urine, unspecified: Secondary | ICD-10-CM | POA: Diagnosis not present

## 2021-05-07 LAB — CBC
HCT: 40.1 % (ref 36.0–46.0)
Hemoglobin: 13.7 g/dL (ref 12.0–15.0)
MCH: 32.1 pg (ref 26.0–34.0)
MCHC: 34.2 g/dL (ref 30.0–36.0)
MCV: 93.9 fL (ref 80.0–100.0)
Platelets: 231 10*3/uL (ref 150–400)
RBC: 4.27 MIL/uL (ref 3.87–5.11)
RDW: 14.1 % (ref 11.5–15.5)
WBC: 21.4 10*3/uL — ABNORMAL HIGH (ref 4.0–10.5)
nRBC: 0.2 % (ref 0.0–0.2)

## 2021-05-07 LAB — BASIC METABOLIC PANEL
Anion gap: 10 (ref 5–15)
BUN: 19 mg/dL (ref 8–23)
CO2: 27 mmol/L (ref 22–32)
Calcium: 9 mg/dL (ref 8.9–10.3)
Chloride: 98 mmol/L (ref 98–111)
Creatinine, Ser: 0.39 mg/dL — ABNORMAL LOW (ref 0.44–1.00)
GFR, Estimated: >60 mL/min (ref >60)
Glucose, Bld: 86 mg/dL (ref 70–99)
Potassium: 4.1 mmol/L (ref 3.5–5.1)
Sodium: 135 mmol/L (ref 135–145)

## 2021-05-07 MED ORDER — BETHANECHOL CHLORIDE 25 MG PO TABS
25.0000 mg | ORAL_TABLET | Freq: Three times a day (TID) | ORAL | Status: DC
  Administered 2021-05-07 – 2021-05-08 (×5): 25 mg via ORAL
  Filled 2021-05-07 (×5): qty 1

## 2021-05-07 MED ORDER — POLYETHYLENE GLYCOL 3350 17 G PO PACK
17.0000 g | PACK | Freq: Two times a day (BID) | ORAL | Status: DC
  Administered 2021-05-07 – 2021-05-29 (×36): 17 g via ORAL
  Filled 2021-05-07 (×44): qty 1

## 2021-05-07 NOTE — Progress Notes (Signed)
Occupational Therapy Session Note  Patient Details  Name: Angela Carpenter MRN: [id number] Date of Birth: 1940-12-21  Today's Date: 05/07/2021 OT Individual Time: 1300-1414 OT Individual Time Calculation (min): 74 min    Short Term Goals: Week 1:  OT Short Term Goal 1 (Week 1): Pt will don shirt wiht MOD A OT Short Term Goal 2 (Week 1): Pt will recall hemi strategies wiht MIN VC OT Short Term Goal 3 (Week 1): Pt will thread 1LE into pants OT Short Term Goal 4 (Week 1): Pt will groom in unsupported sitting wiht MIN A  Skilled Therapeutic Interventions/Progress Updates:     Pt received in bed with no pain reported agreeable to shower.  ADL:  Pt completes bathing with with HOH A to wahs LUE with RUE and A to wash B feet and buttocks. Pt having BM while washing buttocks therefore transferred to [facility] with continent BM in toilet Pt completes UB dressing with S to doff and MOD A to don with MAX VC fo rhemi strategies Pt completes LB dressing with MAX A STS at sink as well as A to hold LLE into figure 4 to thraed Pt completes footwear with total A. Pt able to don L sock with LE positioned. Shoes completed with total A Pt completes toileting with total A in stedy for 3/3 components Pt completes shower/Tub transfer with MOD A for power up to STS in stedy to transfer from EOB>TTB>BSC>w/c   Pt left at end of session in w/c with exit alarm on, call light in reach and all needs met   Therapy Documentation Precautions:  Precautions Precautions: Fall Precaution Comments: R hemiparesis Required Braces or Orthoses: Sling Restrictions Weight Bearing Restrictions: No RLE Weight Bearing: Weight bearing as tolerated General:   Vital Signs: Therapy Vitals Temp: 97.7 F (36.5 C) Temp Source: Oral Pulse Rate: 64 Resp: 18 BP: (!) 118/58 Patient Position (if appropriate): Lying Oxygen Therapy SpO2: 100 % O2 Device: Room Air Pain:   ADL: ADL Grooming: Moderate assistance Where  Assessed-Grooming: Edge of bed Upper Body Bathing: Moderate assistance Where Assessed-Upper Body Bathing: Edge of bed Lower Body Bathing: Maximal assistance Where Assessed-Lower Body Bathing: Edge of bed Upper Body Dressing: Maximal assistance Where Assessed-Upper Body Dressing: Edge of bed Lower Body Dressing: Maximal assistance Where Assessed-Lower Body Dressing: Edge of bed Toileting: Maximal assistance Where Assessed-Toileting: Bedside Commode (stedy) Toilet Transfer: Maximal verbal cueing Toilet Transfer Method: Other (comment) (stedy) Tub/Shower Transfer: Not assessed Vision   Perception    Praxis   Exercises:   Other Treatments:     Therapy/Group: Individual Therapy  Tonny Branch 05/07/2021, 6:52 AM

## 2021-05-07 NOTE — Progress Notes (Signed)
Orthopedic Tech Progress Note Patient Details:  Angela Carpenter [birth year] [id number]  Called in order to [employer] for a PRAFO BOOT and a [employer]ING WHO   Patient ID: [name], female   DOB: [birth year], 80 y.o.   MRN: [id number]  [name] 05/07/2021, 10:34 AM

## 2021-05-07 NOTE — Progress Notes (Signed)
PROGRESS NOTE   Subjective/Complaints: Pt had questions about a/c today, noticed right wrist bruise and doesn't know where it came from. Happy that she's seeing some return in finger  ROS: Patient denies fever, rash, sore throat, blurred vision, nausea, vomiting, diarrhea, cough, shortness of breath or chest pain, joint or back pain,   or mood change.    Objective:   VAS [place] LOWER EXTREMITY VENOUS (DVT)  Result Date: 05/05/2021  Lower Venous DVT Study Patient Name:  Angela Carpenter  Date of Exam:   05/05/2021 Medical Rec #: [id number]      Accession #:    [id number] Date of Birth: [birth year]     Patient Gender: F Patient Age:   42Y Exam Location:  [facility] Procedure:      VAS [place] LOWER EXTREMITY VENOUS (DVT) Referring Phys: [place] --------------------------------------------------------------------------------  Indications: Edema, and Metastatic brain cancer, rehabilitation.  Anticoagulation: Patient was on Eliquis for atrial fibrillation but it was discontinued in March 2022 secondary to brain mass. Comparison Study: No prior study Performing Technologist: Sharion Dove RVS  Examination Guidelines: A complete evaluation includes B-mode imaging, spectral Doppler, color Doppler, and power Doppler as needed of all accessible portions of each vessel. Bilateral testing is considered an integral part of a complete examination. Limited examinations for reoccurring indications may be performed as noted. The reflux portion of the exam is performed with the patient in reverse Trendelenburg.  +---------+---------------+---------+-----------+----------+-----------------+ RIGHT    CompressibilityPhasicitySpontaneityPropertiesThrombus Aging    +---------+---------------+---------+-----------+----------+-----------------+ CFV      Partial        Yes      Yes                  Chronic            +---------+---------------+---------+-----------+----------+-----------------+ SFJ      Full                                                           +---------+---------------+---------+-----------+----------+-----------------+ FV Prox  Partial                                      Chronic           +---------+---------------+---------+-----------+----------+-----------------+ FV Mid   Partial                                      Chronic           +---------+---------------+---------+-----------+----------+-----------------+ FV DistalFull                                                           +---------+---------------+---------+-----------+----------+-----------------+ PFV  Full                                                           +---------+---------------+---------+-----------+----------+-----------------+ POP      None                                         Age Indeterminate +---------+---------------+---------+-----------+----------+-----------------+ PTV      None                                         Age Indeterminate +---------+---------------+---------+-----------+----------+-----------------+ PERO     None                                         Age Indeterminate +---------+---------------+---------+-----------+----------+-----------------+   +---------+---------------+---------+-----------+----------+-----------------+ LEFT     CompressibilityPhasicitySpontaneityPropertiesThrombus Aging    +---------+---------------+---------+-----------+----------+-----------------+ CFV      Full           Yes      Yes                                    +---------+---------------+---------+-----------+----------+-----------------+ SFJ      Full                                                           +---------+---------------+---------+-----------+----------+-----------------+ FV Prox  Full                                                            +---------+---------------+---------+-----------+----------+-----------------+ FV Mid   Full                                                           +---------+---------------+---------+-----------+----------+-----------------+ FV DistalFull                                                           +---------+---------------+---------+-----------+----------+-----------------+ PFV      Full                                                           +---------+---------------+---------+-----------+----------+-----------------+  POP      Full           Yes      Yes                                    +---------+---------------+---------+-----------+----------+-----------------+ PTV      Partial                                      Age Indeterminate +---------+---------------+---------+-----------+----------+-----------------+ PERO     Partial                                      Age Indeterminate +---------+---------------+---------+-----------+----------+-----------------+     Summary: RIGHT: - Findings consistent with age indeterminate deep vein thrombosis involving the right popliteal vein, right posterior tibial veins, and right peroneal veins. - Findings consistent with chronic deep vein thrombosis involving the right common femoral vein, and right femoral vein.  LEFT: - Findings consistent with age indeterminate deep vein thrombosis involving the left posterior tibial veins, and left peroneal veins.  *See table(s) above for measurements and observations. Electronically signed by Deitra Mayo MD on 05/05/2021 at 6:44:38 PM.    Final    Recent Labs    05/07/21 0458  WBC 21.4*  HGB 13.7  HCT 40.1  PLT 231   Recent Labs    05/07/21 0458  NA 135  K 4.1  CL 98  CO2 27  GLUCOSE 86  BUN 19  CREATININE 0.39*  CALCIUM 9.0    Intake/Output Summary (Last 24 hours) at 05/07/2021 1015 Last data filed at 05/07/2021 0847 Gross per 24  hour  Intake 480 ml  Output 1975 ml  Net -1495 ml        Physical Exam: Vital Signs Blood pressure (!) 118/58, pulse 64, temperature 97.7 F (36.5 C), temperature source Oral, resp. rate 18, height '5\' 5"'$  (1.651 m), weight 70.3 kg, SpO2 100 %. Constitutional: No distress . Vital signs reviewed. HEENT: EOMI, oral membranes moist Neck: supple Cardiovascular: RRR without murmur. No JVD    Respiratory/Chest: CTA Bilaterally without wheezes or rales. Normal effort    GI/Abdomen: BS +, non-tender, non-distended Ext: no clubbing, cyanosis, or edema Psych: pleasant and cooperative  Skin: crani incision CDI with scab, dressing stapled to scalp. Bruise to volar wrist without swelling Neuro: Alert and oriented x 3. Normal insight and awareness. Intact Memory. Mild right central 7. No focal visual deficits. Speech somewhat fluent but has word finding deficits. RUE: ? Pecs, 1/5 finger flexors.  RLE 0/5 prox to distal. LUE and LLE 4/5. Pt with good sitting balance. Only minimal sensory difference to LT Left to Right. DTR's absent Musculoskeletal: Full ROM, No pain with PROM in the neck, trunk, or extremities. Posture appropriate.     Assessment/Plan: 1. Functional deficits which require 3+ hours per day of interdisciplinary therapy in a comprehensive inpatient rehab setting. Physiatrist is providing close team supervision and 24 hour management of active medical problems listed below. Physiatrist and rehab team continue to assess barriers to discharge/monitor patient progress toward functional and medical goals  Care Tool:  Bathing    Body parts bathed by patient: Right arm, Chest, Abdomen, Right upper leg, Left upper leg, Face   Body parts bathed by helper:  Left arm, Buttocks, Front perineal area, Right lower leg, Left lower leg Body parts n/a: Front perineal area   Bathing assist Assist Level: Maximal Assistance - Patient 24 - 49%     Upper Body Dressing/Undressing Upper body dressing    What is the patient wearing?: Pull over shirt Orthosis activity level: Performed by helper  Upper body assist Assist Level: Moderate Assistance - Patient 50 - 74%    Lower Body Dressing/Undressing Lower body dressing      What is the patient wearing?: Incontinence brief     Lower body assist Assist for lower body dressing: Maximal Assistance - Patient 25 - 49%     Toileting Toileting    Toileting assist Assist for toileting: 2 Helpers     Transfers Chair/bed transfer  Transfers assist     Chair/bed transfer assist level: 2 Helpers     Locomotion Ambulation   Ambulation assist   Ambulation activity did not occur: Safety/medical concerns          Walk 10 feet activity   Assist  Walk 10 feet activity did not occur: Safety/medical concerns        Walk 50 feet activity   Assist Walk 50 feet with 2 turns activity did not occur: Safety/medical concerns         Walk 150 feet activity   Assist Walk 150 feet activity did not occur: Safety/medical concerns         Walk 10 feet on uneven surface  activity   Assist Walk 10 feet on uneven surfaces activity did not occur: Safety/medical concerns         Wheelchair     Assist Will patient use wheelchair at discharge?: Yes Type of Wheelchair: Manual    Wheelchair assist level: Moderate Assistance - Patient 50 - 74% Max wheelchair distance: 50    Wheelchair 50 feet with 2 turns activity    Assist        Assist Level: Moderate Assistance - Patient 50 - 74%   Wheelchair 150 feet activity     Assist      Assist Level: Maximal Assistance - Patient 25 - 49%   Blood pressure (!) 118/58, pulse 64, temperature 97.7 F (36.5 C), temperature source Oral, resp. rate 18, height '5\' 5"'$  (1.651 m), weight 70.3 kg, SpO2 100 %.  Medical Problem List and Plan: 1.  R hemiplegia secondary to Metastatic disease from melanoma s/p L frontal crani 05/01/21             -patient may  shower              -ELOS/Goals: 2-3 weeks - goals min A 2.  Age indeterminate DVT in right popliteal vein, and in bilateral posterior tibial and peroneal veins. Chronic DVT in right common femoral and femoral veins. Given age indeterminate DVTs and history of ICH.  Dr. Julien Nordmann  recommends prophylactic Lovenox- no IVC filter- resumption of Eliquis in a few weeks. NSGY ok'ed starting prophylactic Lovenox. Reviewed again with pt today.               -antiplatelet therapy: N/A 3. Pain Management: Tylenol prn.  4. Mood: LCSW to follow for evaluation and support.              -antipsychotic agents: N/A 5. Neuropsych: This patient is capable of making decisions on her own behalf. 6. Skin/Wound Care:  Routine pressure relief measures.  7. Fluids/Electrolytes/Nutrition: encourage PO  -intake reasonable  -I personally reviewed the  patient's labs today.     -protein supp for albumin 8. Paroxymal A fib:/HTN: Monitor BP tid--continue metoprolol daily.             --Off Eliquis due to hemorrhagic 9. Glaucoma: Has been stable on timoptic--will resume.  10. Pre-diabetes: Hgb A1C-6.1. Diet liberalized to regular per patient/family request.             - monitor FBS weekly 11.  Leucocytosis: Likely due to steroid effect, DVT.              --no fevers or other signs of infection.  -21.4 8/1---still no signs of infection. Ucx negative 12. Hyponatremia: Sodium up to 135 today .  13. Urinary retention: volumes from I/C still 600-700  -increase urecholine to '25mg'$   -not sure bp can tolerate flomax  -OOB to toilet/bsc to void  -UA negative, f/u UC. Discussed could be related to her constipation.  14. Constipation: had a large bm yesterday. Feels better today.   -will schedule miralax bid along with senna-s    LOS: 4 days A FACE TO FACE EVALUATION WAS PERFORMED  [name] 05/07/2021, 10:15 AM

## 2021-05-07 NOTE — Progress Notes (Signed)
Speech Language Pathology Daily Session Note  Patient Details  Name: Angela Carpenter MRN: [id number] Date of Birth: [birth year]  Today's Date: 05/07/2021 SLP Individual Time: 0945-1030 SLP Individual Time Calculation (min): 45 min  Short Term Goals: Week 1: SLP Short Term Goal 1 (Week 1): Pt will demonstrate funtional problem solving skills for safety awareness given min verbal cueing. SLP Short Term Goal 2 (Week 1): Pt will demonstrate use of word finding strategies to decrease anomia in conversation given min verbal cueing. SLP Short Term Goal 3 (Week 1): Pt will follow complex 2-step directions given min verbal cueing. SLP Short Term Goal 4 (Week 1): Pt will complete medication and financial management for complex problem solving with mod verbal cueing.  Skilled Therapeutic Interventions: Skilled treatment session focused on communication goals. Throughout a functional conversation, patient was overall Mod I for word-finding. However, as the task became more abstract and structured such as a convergent naming task. Patient required extra time and overall Min A for use of word-finding strategies. Patient left upright in wheelchair with alarm on and all needs within reach. Continue with current plan of care.      Pain Pain Assessment Pain Scale: 0-10 Pain Score: 0-No pain  Therapy/Group: Individual Therapy  Angela Carpenter 05/07/2021, 3:15 PM

## 2021-05-07 NOTE — Progress Notes (Signed)
Physical Therapy Session Note  Patient Details  Name: Angela Carpenter MRN: [id number] Date of Birth: March 22, 1941  Today's Date: 05/07/2021 PT Individual Time: 0900-0945 PT Individual Time Calculation (min): 45 min   Short Term Goals: Week 1:  PT Short Term Goal 1 (Week 1): Pt will perform squat pivot transfer to/from wc to L side w/mod assist of 1 PT Short Term Goal 2 (Week 1): pt will propel wc using hemi technique x 169f w/additional time and supervision PT Short Term Goal 3 (Week 1): pt will stand statically w/LUE support and min assist x 2 min LRAD/use of RLE blocking/bracing as needed PT Short Term Goal 4 (Week 1): supine to side to sit w/cga only  Skilled Therapeutic Interventions/Progress Updates:     Patient in bed upon PT arrival. Patient alert and agreeable to PT session. Patient denied pain during session.  Therapeutic Activity: Bed Mobility: Patient donned pants with total A bed level, performed rolling R/L with mod A and use of bed rails with cues for sequencing and hemi-technique. Patient performed supine to sit with min-mod A with use of bed rail. Provided verbal cues for rolling R and bringing knees to chest to bring her legs off the bed. Transfers: Patient performed stand pivot bed>w/c with mod/max A due to posterior bias and reduced motor planning. She performed sit to/from stand x1 using L rail with min A. Provided verbal cues for scooting forward, facilitation for R foot placement and forward weight shift.  Gait Training:  Patient ambulated 30 feet using L rail with min A and max A for R foot advancement. Facilitated B weight shift for reciprocal gait pattern and improved R clearance, patient able to initiate hip flexor and hamstring activation to initiate R limb advancement, continues to lack DF activation throughout.  Patient in w/c in the room handed [place] to [place] session.  Therapy Documentation Precautions:  Precautions Precautions:  Fall Precaution Comments: R hemiparesis Required Braces or Orthoses: Sling Restrictions Weight Bearing Restrictions: No RLE Weight Bearing: Weight bearing as tolerated    Therapy/Group: Individual Therapy  Brigid Vandekamp L Octa Uplinger PT, DPT  05/07/2021, 12:54 PM

## 2021-05-07 NOTE — Progress Notes (Signed)
Occupational Therapy Session Note  Patient Details  Name: Angela Carpenter MRN: [id number] Date of Birth: [birth year]  Today's Date: 05/07/2021 OT Individual Time: 1430-1500 OT Individual Time Calculation (min): 30 min    Short Term Goals: Week 1:  OT Short Term Goal 1 (Week 1): Pt will don shirt wiht MOD A OT Short Term Goal 2 (Week 1): Pt will recall hemi strategies wiht MIN VC OT Short Term Goal 3 (Week 1): Pt will thread 1LE into pants OT Short Term Goal 4 (Week 1): Pt will groom in unsupported sitting wiht MIN A  Skilled Therapeutic Interventions/Progress Updates:    Pt received in w/c with no c/o pain. Session focused on RUE NMR and activation. Pt has very loose gross flexion activation of all fingers, 2-5 >1. Wrist flexion ~5 degrees. Very trace bicep activation, no AROM. Upper and middle trapezius activation, 3-/5. Edema present in pt's L hand- kinesiotape applied. No adverse skin reactions or pt reports of discomfort. Pt returned to her room, left sitting up with all needs met, chair alarm set.   Therapy Documentation Precautions:  Precautions Precautions: Fall Precaution Comments: R hemiparesis Required Braces or Orthoses: Sling Restrictions Weight Bearing Restrictions: No RLE Weight Bearing: Weight bearing as tolerated     Therapy/Group: Individual Therapy  Curtis Sites 05/07/2021, 12:49 PM

## 2021-05-08 NOTE — Progress Notes (Signed)
Occupational Therapy Session Note  Patient Details  Name: Angela Carpenter MRN: [id number] Date of Birth: 1941-06-24  Today's Date: 05/08/2021 OT Individual Time: 0830-0930 OT Individual Time Calculation (min): 60 min    Short Term Goals: Week 1:  OT Short Term Goal 1 (Week 1): Pt will don shirt wiht MOD A OT Short Term Goal 2 (Week 1): Pt will recall hemi strategies wiht MIN VC OT Short Term Goal 3 (Week 1): Pt will thread 1LE into pants OT Short Term Goal 4 (Week 1): Pt will groom in unsupported sitting wiht MIN A  Skilled Therapeutic Interventions/Progress Updates:    Pt received supine, no c/o pain. Pt completed bed mobility with cueing for R attention and mod A for RUE/RLE management. Sit > stand from EOB with max A. Pt required max facilitation for RUE placement on built up handle of RW but then was able to maintain a loose grasp. Only mod A for pivot to the chair. Min A for UB bathing, mod A to don shirt. Hemi techniques reviewed and min cueing to follow. Pt stood with only mod A using the sink. Pt required mod A to pull up pants in standing. One finger sublux palpated in pt's R shoulder. Lengthy education provided re sublux and need for shoulder support. Pt was provided with a level 1 foam block to practice gross hand flexion. Encouraged pt to facilitate finger extension herself as well. Pt left sitting up with all needs met.   Therapy Documentation Precautions:  Precautions Precautions: Fall Precaution Comments: R hemiparesis Required Braces or Orthoses: Sling Restrictions Weight Bearing Restrictions: No RLE Weight Bearing: Weight bearing as tolerated   Therapy/Group: Individual Therapy  Curtis Sites 05/08/2021, 6:19 AM

## 2021-05-08 NOTE — Progress Notes (Signed)
Patient ID: Angela Carpenter, female   DOB: 1941/05/03, 80 y.o.   MRN: [id number]  SW met with pt in room to provide updates from team conference, and d/c date 8/19. Pt aware SW to follow-up with her son Angela Carpenter to discuss further.   SW spoke with pt son Angela Carpenter to provide updates. Pt son would like to be able to determine pt level of care need. SW discussed family education and suggested once next week and the week of d/c to gauge pt progress. Will f/u with his uncle and aunt about a good day and time to come in. SW waiting on follow-up.   [name], MSW, [place] Office: [phone] Cell: [phone] Fax: [phone]

## 2021-05-08 NOTE — Progress Notes (Signed)
Speech Language Pathology Daily Session Note  Patient Details  Name: Angela Carpenter MRN: [id number] Date of Birth: [birth year]  Today's Date: 05/08/2021 SLP Individual Time: 1100-1130 SLP Individual Time Calculation (min): 30 min  Short Term Goals: Week 1: SLP Short Term Goal 1 (Week 1): Pt will demonstrate funtional problem solving skills for safety awareness given min verbal cueing. SLP Short Term Goal 2 (Week 1): Pt will demonstrate use of word finding strategies to decrease anomia in conversation given min verbal cueing. SLP Short Term Goal 3 (Week 1): Pt will follow complex 2-step directions given min verbal cueing. SLP Short Term Goal 4 (Week 1): Pt will complete medication and financial management for complex problem solving with mod verbal cueing.  Skilled Therapeutic Interventions: Skilled treatment session focused on communication goals. SLP facilitated session by providing extra time and supervision level verbal cues for word-finding during a mildly complex verbal expression task. However, increased verbal directions and clarification was needed for comprehension regarding procedures to task. Patient also mildly deflective when demonstrating difficulty with word-finding. Patient left upright in wheelchair with alarm on and all needs within reach. Continue with current plan of care.      Pain No/Denies Pain   Therapy/Group: Individual Therapy  Riyan Gavina 05/08/2021, 12:56 PM

## 2021-05-08 NOTE — Patient Care Conference (Signed)
Inpatient RehabilitationTeam Conference and Plan of Care Update Date: 05/08/2021   Time: 10:13 AM    Patient Name: Angela Carpenter      Medical Record Number: [id number]  Date of Birth: 01-06-1941 Sex: Female         Room/Bed: 4W05C/4W05C-01 Payor Info: Payor: MEDICARE / Plan: MEDICARE PART A AND B / Product Type: *No Product type* /    Admit Date/Time:  05/03/2021  3:16 PM  Primary Diagnosis:  Metastatic cancer to brain [facility])  Hospital Problems: Principal Problem:   Metastatic cancer to brain [facility]) Active Problems:   Seizure disorder, focal motor ([place])   Right hemiplegia [facility])    Expected Discharge Date: Expected Discharge Date: 05/25/21  Team Members Present: Physician leading conference: Dr. Alger Simons Social Worker Present: [name], [place] Nurse Present: Dorthula Nettles, RN PT Present: Apolinar Junes, PT OT Present: Laverle Hobby, OT SLP Present: [name], SLP PPS Coordinator present : Gunnar Fusi, SLP     Current Status/Progress Goal Weekly Team Focus  Bowel/Bladder   continent/incontinent B/B  to gain continenence  timed toilet q 2 hr.   Swallow/Nutrition/ Hydration             ADL's   Max A LB, mod A UB, R UE with edema- finger flexion and wrist activation  (S) to min A  RUE NMR, transfers, hemi technique, ADLs   Mobility   Mod A overall with intermittent min A, gait 30 ft with L rail  CGA bed mobility and transfers mod A gait and stairs  Activity tolernace, balance, functional mobility, gait and stair training, R NMR, patient/caregiver education   Communication   Min A for structured, supervision-Mod I for informal  Supervision  complex comprehension, word-finding strategies   Safety/Cognition/ Behavioral Observations  Min A  Supervision  complex problem solving and attention   Pain   no complaints of pain  remain pain free  assess pain q 4 hr and prn   Skin   crani incision with staples  skin to remain breakdown free  assess skin q  shift and prn     Discharge Planning:  D/c to home with support from son who will move in the home, as well as, siblings and in-laws.   Team Discussion: Staples removed today, DVT's throughout legs. Lovenox started, will eventually switch to Eliquis. Has urinary retention and bowels are slow. Continent/incontinent B/B. No pain reported, Crani incision healing. Flat affect. Mod assist upper body, max assist lower body. Mod assist bed mobility transfers. Walked 30 ft using hallway rail. Going in [place] today. Improvement with word finding, has mild misunderstanding but maintains it wasn't explained well. Patient on target to meet rehab goals: Supervision to min assist goals.  *See Care Plan and progress notes for long and short-term goals.   Revisions to Treatment Plan:  Not at this time.  Teaching Needs: Family education, medication management, skin/wound care, transfer training, gait training, balance training, endurance training, stair training, safety awareness.  Current Barriers to Discharge: Decreased caregiver support, Medical stability, Home enviroment access/layout, Incontinence, Wound care, Lack of/limited family support, Weight, Medication compliance, Pending chemo/radiation, and Behavior  Possible Resolutions to Barriers: Continue current medications, provide emotional support.     Medical Summary Current Status: metastatic melanoma to left brain s/p resection. bilateral DVT's, urine retention. dense right hemiparesis  Barriers to Discharge: Medical stability   Possible Resolutions to [employer] Focus: consultation with medical/surgical team re: a/c, daily review of labs, pt data   Continued  Need for Acute Rehabilitation Level of Care: The patient requires daily medical management by a physician with specialized training in physical medicine and rehabilitation for the following reasons: Direction of a multidisciplinary physical rehabilitation program to maximize  functional independence : Yes Medical management of patient stability for increased activity during participation in an intensive rehabilitation regime.: Yes Analysis of laboratory values and/or radiology reports with any subsequent need for medication adjustment and/or medical intervention. : Yes   I attest that I was present, lead the team conference, and concur with the assessment and plan of the team.   Cristi Loron 05/08/2021, 2:34 PM

## 2021-05-08 NOTE — Progress Notes (Signed)
PROGRESS NOTE   Subjective/Complaints: Pt up at sink. No new complaints today. Seeing some more return in right hand!  ROS: Patient denies fever, rash, sore throat, blurred vision, nausea, vomiting, diarrhea, cough, shortness of breath or chest pain, joint or back pain, headache, or mood change.    Objective:   No results found. Recent Labs    05/07/21 0458  WBC 21.4*  HGB 13.7  HCT 40.1  PLT 231   Recent Labs    05/07/21 0458  NA 135  K 4.1  CL 98  CO2 27  GLUCOSE 86  BUN 19  CREATININE 0.39*  CALCIUM 9.0    Intake/Output Summary (Last 24 hours) at 05/08/2021 1024 Last data filed at 05/08/2021 0345 Gross per 24 hour  Intake 476 ml  Output 2376 ml  Net -1900 ml        Physical Exam: Vital Signs Blood pressure 115/76, pulse 67, temperature 97.7 F (36.5 C), resp. rate 16, height '5\' 5"'$  (1.651 m), weight 70.3 kg, SpO2 100 %. Constitutional: No distress . Vital signs reviewed. HEENT: EOMI, oral membranes moist Neck: supple Cardiovascular: RRR without murmur. No JVD    Respiratory/Chest: CTA Bilaterally without wheezes or rales. Normal effort    GI/Abdomen: BS +, non-tender, non-distended Ext: no clubbing, cyanosis, or edema Psych: pleasant and cooperative  Skin: crani incision CDI with scab, dressing stapled to scalp. Bruise to volar wrist without swelling Neuro: Alert and oriented x 3. Normal insight and awareness. Intact Memory. Mild right central 7. No focal visual deficits. Speech somewhat fluent but has word finding deficits. RUE: ? Pecs, 1+/5 finger flexors, trace wrist.  RLE 0/5 prox to distal. LUE and LLE 4/5. Pt with good sitting balance. Only minimal sensory difference to LT Left to Right. DTR's absent Musculoskeletal: Full ROM, No pain with PROM in the neck, trunk, or extremities. Posture appropriate.     Assessment/Plan: 1. Functional deficits which require 3+ hours per day of interdisciplinary  therapy in a comprehensive inpatient rehab setting. Physiatrist is providing close team supervision and 24 hour management of active medical problems listed below. Physiatrist and rehab team continue to assess barriers to discharge/monitor patient progress toward functional and medical goals  Care Tool:  Bathing    Body parts bathed by patient: Right arm, Chest, Abdomen, Right upper leg, Left upper leg, Face   Body parts bathed by helper: Left arm, Buttocks, Front perineal area, Right lower leg, Left lower leg Body parts n/a: Front perineal area   Bathing assist Assist Level: Maximal Assistance - Patient 24 - 49%     Upper Body Dressing/Undressing Upper body dressing   What is the patient wearing?: Pull over shirt Orthosis activity level: Performed by helper  Upper body assist Assist Level: Moderate Assistance - Patient 50 - 74%    Lower Body Dressing/Undressing Lower body dressing      What is the patient wearing?: Incontinence brief     Lower body assist Assist for lower body dressing: Maximal Assistance - Patient 25 - 49%     Toileting Toileting    Toileting assist Assist for toileting: 2 Helpers     Transfers Chair/bed transfer  Transfers assist  Chair/bed transfer assist level: 2 Helpers     Locomotion Ambulation   Ambulation assist   Ambulation activity did not occur: Safety/medical concerns          Walk 10 feet activity   Assist  Walk 10 feet activity did not occur: Safety/medical concerns        Walk 50 feet activity   Assist Walk 50 feet with 2 turns activity did not occur: Safety/medical concerns         Walk 150 feet activity   Assist Walk 150 feet activity did not occur: Safety/medical concerns         Walk 10 feet on uneven surface  activity   Assist Walk 10 feet on uneven surfaces activity did not occur: Safety/medical concerns         Wheelchair     Assist Will patient use wheelchair at discharge?:  Yes Type of Wheelchair: Manual    Wheelchair assist level: Moderate Assistance - Patient 50 - 74% Max wheelchair distance: 50    Wheelchair 50 feet with 2 turns activity    Assist        Assist Level: Moderate Assistance - Patient 50 - 74%   Wheelchair 150 feet activity     Assist      Assist Level: Maximal Assistance - Patient 25 - 49%   Blood pressure 115/76, pulse 67, temperature 97.7 F (36.5 C), resp. rate 16, height '5\' 5"'$  (1.651 m), weight 70.3 kg, SpO2 100 %.  Medical Problem List and Plan: 1.  R hemiplegia secondary to Metastatic disease from melanoma s/p L frontal crani 05/01/21             -patient may  shower             -ELOS/Goals: 2-3 weeks - goals min A  -team conf today 2.  Age indeterminate DVT in right popliteal vein, and in bilateral posterior tibial and peroneal veins. Chronic DVT in right common femoral and femoral veins. Given age indeterminate DVTs and history of ICH.  Dr. Julien Nordmann  recommends prophylactic Lovenox- no IVC filter- resumption of Eliquis in a week or two. NSGY ok'ed starting prophylactic Lovenox this past weekend.                -antiplatelet therapy: N/A 3. Pain Management: Tylenol prn.  4. Mood: LCSW to follow for evaluation and support.              -antipsychotic agents: N/A 5. Neuropsych: This patient is capable of making decisions on her own behalf. 6. Skin/Wound Care:  Routine pressure relief measures.  7. Fluids/Electrolytes/Nutrition: encourage PO  -intake reasonable   -protein supp for albumin 8. Paroxymal A fib:/HTN: Monitor BP tid--continue metoprolol daily.             --Off Eliquis due to hemorrhage 9. Glaucoma: Has been stable on timoptic--will resume.  10. Pre-diabetes: Hgb A1C-6.1. Diet liberalized to regular per patient/family request.             - monitor FBS weekly 11.  Leucocytosis: Likely due to steroid effect, DVT.              --no fevers or other signs of infection.  -21.4 8/1---still no signs of  infection. Repeat Ucx negative 12. Hyponatremia: Sodium up to 135  .  13. Urinary retention: volumes from I/C still 600+, did have lower one this morning  -continue urecholine '25mg'$  tid  -not sure bp can tolerate flomax  -OOB to  toilet/bsc to void  -  14. Constipation: moved bowels this w/e   -will schedule miralax bid along with senna-s    LOS: 5 days A FACE TO FACE EVALUATION WAS PERFORMED  [name] 05/08/2021, 10:24 AM

## 2021-05-08 NOTE — Care Management (Signed)
Inpatient [facility] Individual Statement of Services  Patient Name:  Angela Carpenter  Date:  05/08/2021  Welcome to the [facility].  Our goal is to provide you with an individualized program based on your diagnosis and situation, designed to meet your specific needs.  With this comprehensive rehabilitation program, you will be expected to participate in at least 3 hours of rehabilitation therapies Monday-Friday, with modified therapy programming on the weekends.  Your rehabilitation program will include the following services:  Physical Therapy (PT), Occupational Therapy (OT), Speech Therapy (ST), 24 hour per day rehabilitation nursing, Therapeutic Recreaction (TR), Psychology, Neuropsychology, Care Coordinator, Rehabilitation Medicine, [place], and Other  Weekly team conferences will be held on Tuesdays to discuss your progress.  Your Inpatient Rehabilitation Care Coordinator will talk with you frequently to get your input and to update you on team discussions.  Team conferences with you and your family in attendance may also be held.  Expected length of stay: 18-21 days    Overall anticipated outcome: Minimal Assistance  Depending on your progress and recovery, your program may change. Your Inpatient Rehabilitation Care Coordinator will coordinate services and will keep you informed of any changes. Your Inpatient Rehabilitation Care Coordinator's name and contact numbers are listed  below.  The following services may also be recommended but are not provided by the [facility] will be made to provide these services after discharge if needed.  Arrangements include referral to agencies that provide these services.  Your insurance has been verified to be:  Medicare A/B  Your  primary doctor is:  Domenick Gong   Pertinent information will be shared with your doctor and your insurance company.  Inpatient Rehabilitation Care Coordinator:  Cathleen Corti [phone] or [phone]  Information discussed with and copy given to patient by: Rana Snare, 05/08/2021, 9:39 AM

## 2021-05-08 NOTE — Progress Notes (Signed)
Physical Therapy Session Note  Patient Details  Name: Angela Carpenter MRN: [id number] Date of Birth: 08-Feb-1941  Today's Date: 05/08/2021 PT Individual Time: 1135-1205 and 1300-1410 PT Individual Time Calculation (min): 30 min and 70 min   Short Term Goals: Week 1:  PT Short Term Goal 1 (Week 1): Pt will perform squat pivot transfer to/from wc to L side w/mod assist of 1 PT Short Term Goal 2 (Week 1): pt will propel wc using hemi technique x 173f w/additional time and supervision PT Short Term Goal 3 (Week 1): pt will stand statically w/LUE support and min assist x 2 min LRAD/use of RLE blocking/bracing as needed PT Short Term Goal 4 (Week 1): supine to side to sit w/cga only  Skilled Therapeutic Interventions/Progress Updates:     Session 1: Patient in w/c in the room upon PT arrival. Patient alert and agreeable to PT session. Patient denied pain during session. Noted patient with some increased R lower extremity edema and patient report of numbness/tingling in her feet when having her shoes on. Discussed use of TEDs with RN and retrieved knee high TED hose. Doffed socks with total A, noted multiple abrasions to the dorsum of her R foot and L toes. Patient reports a fall on her deck 2-3 weeks ago where she crawled into the house on her hands and knees that resulted in the abrasions. Wounds on the R foot were closed and scabbed showing signs of healing. Made RN aware and decided on placing a foam dressing over the abrasions to maintain skin integrity before donning B TED hose. Donned B TED hose and tennis shoes with total A. Patient performed sit to stand with mod A using RW for pressure relief and to work on standing balance x2 min. Provided cues for scooting forward, hand placement with hand-over-hand assist for L hand placement on RW, forward weight shift, and gluteal and quad activation to boost up to standing. Patient progressed from min A with mild posterior bias to CGA in midline during static  standing with cues for R gluteal and quad activation, R trunk elongation, and equal weight bearing. Patient in w/c in the room with RN and NT in the room at end of session with breaks locked, seat belt alarm set, and all needs within reach.   Session 2: Patient in w/c in the room upon PT arrival. Patient alert and agreeable to PT session. Patient denied pain during session. Patient [facility] session before bladder scan.   Therapeutic Activity: Transfers: Patient performed sit to/from stand x3 with min A using Lite Gait handles to don harness and in preparation for gait training. Provided verbal cues for scooting forward and forward weight shift with facilitation for both. Patient performed a stand pivot w/c>BSC over the toilet using the grab bar with  her L hand with mod A, increased time, and total A for R foot placement. Lowered pants and incontinence brief with total A with patient in standing.   Gait Training:  Patient ambulated 8 feet, 35 feet, and 180 feet in the Lite Gait with min A to facilitate weight shift and total-mod A for R limb advancement, able to sept independently x2. Ambulated with decreased weight shift to off weight opposite foot bilaterally, decreased knee/hip extension in stance on R with intermittent blocking to prevent excessive knee flexion or buckling, decreased R DF and foot clearance, R posterior rotated pelvis, and decreased hamstring activation in pre-swing. Applied ACE wrap  to R hand to secure grip throughout and DF wrap to R lower extremity for improved foot clearance. Provided verbal cues for sequencing, weight shifting, and reciprocal gait pattern.  Wheelchair Mobility:  Patient propelled wheelchair >100 feet with supervision using L hemi-technique. Provided verbal cues and demonstration for hemi-technique and turns.  Patient seated on BSC over the toilet in the bathroom when handed off to NT at end of session.    Therapy  Documentation Precautions:  Precautions Precautions: Fall Precaution Comments: R hemiparesis Required Braces or Orthoses: Sling Restrictions Weight Bearing Restrictions: No RLE Weight Bearing: Weight bearing as tolerated    Therapy/Group: Individual Therapy  Dellis Voght L Joeline Freer PT, DPT  05/08/2021, 4:57 PM

## 2021-05-09 MED ORDER — TAMSULOSIN HCL 0.4 MG PO CAPS
0.4000 mg | ORAL_CAPSULE | Freq: Every day | ORAL | Status: DC
  Administered 2021-05-09 – 2021-05-28 (×20): 0.4 mg via ORAL
  Filled 2021-05-09 (×20): qty 1

## 2021-05-09 MED ORDER — BETHANECHOL CHLORIDE 25 MG PO TABS
50.0000 mg | ORAL_TABLET | Freq: Three times a day (TID) | ORAL | Status: DC
  Administered 2021-05-09 – 2021-05-25 (×48): 50 mg via ORAL
  Filled 2021-05-09 (×49): qty 2

## 2021-05-09 MED ORDER — BETHANECHOL CHLORIDE 25 MG PO TABS
50.0000 mg | ORAL_TABLET | Freq: Once | ORAL | Status: AC
  Administered 2021-05-09: 50 mg via ORAL
  Filled 2021-05-09: qty 2

## 2021-05-09 NOTE — Progress Notes (Signed)
Occupational Therapy Session Note  Patient Details  Name: Angela Carpenter MRN: [id number] Date of Birth: 10/18/1940  Today's Date: 05/09/2021 OT Individual Time: 0730-0830 OT Individual Time Calculation (min): 60 min    Short Term Goals: Week 1:  OT Short Term Goal 1 (Week 1): Pt will don shirt wiht MOD A OT Short Term Goal 2 (Week 1): Pt will recall hemi strategies wiht MIN VC OT Short Term Goal 3 (Week 1): Pt will thread 1LE into pants OT Short Term Goal 4 (Week 1): Pt will groom in unsupported sitting wiht MIN A  Skilled Therapeutic Interventions/Progress Updates:    Pt supine, reporting discomfort with full bladder. Pt agreeable to attempt and void before cath. Pt completed bed mobility with mod A to EOB. Mod A to stand from EOB with RW, manual facilitation for RUE placement on RW and cueing for correct LUE placement. Mod A for stand pivot transfer to w/c- pt able to maintain L weightshift for OT to provide mod facilitation of the RLE. Pt's R foot is often externally rotated but pt reports no pain. Mod A for stand pivot transfer to the [facility] over toilet with use of grab bar. Pt voided urine! Pt transferred back to the w/c and then into the shower with mod A. Pt completed UB bathing with mod A for HOH RUE integration. Mod A for LB bathing with lateral leans. Pt completed transfer back to the w/c following shower with mod A. Mod cueing for hemi technique when dressing. Mod A to don shirt, max A to don incontinence brief. Pt returned to supine in bed for bladder scan. Pt left with all needs met, bed alarm set.   Therapy Documentation Precautions:  Precautions Precautions: Fall Precaution Comments: R hemiparesis Required Braces or Orthoses: Sling Restrictions Weight Bearing Restrictions: Yes RLE Weight Bearing: Weight bearing as tolerated  Therapy/Group: Individual Therapy  Curtis Sites 05/09/2021, 6:12 AM

## 2021-05-09 NOTE — Progress Notes (Signed)
PROGRESS NOTE   Subjective/Complaints: OT with pt. At EOB. Had to be I/O cathed a few times over last 24. 600-700cc. Moved bowels  ROS: Patient denies fever, rash, sore throat, blurred vision, nausea, vomiting, diarrhea, cough, shortness of breath or chest pain, joint or back pain, headache, or mood change.    Objective:   No results found. Recent Labs    05/07/21 0458  WBC 21.4*  HGB 13.7  HCT 40.1  PLT 231   Recent Labs    05/07/21 0458  NA 135  K 4.1  CL 98  CO2 27  GLUCOSE 86  BUN 19  CREATININE 0.39*  CALCIUM 9.0    Intake/Output Summary (Last 24 hours) at 05/09/2021 0813 Last data filed at 05/08/2021 2300 Gross per 24 hour  Intake 118 ml  Output 2050 ml  Net -1932 ml        Physical Exam: Vital Signs Blood pressure (!) 117/56, pulse 62, temperature 97.7 F (36.5 C), temperature source Oral, resp. rate 14, height '5\' 5"'$  (1.651 m), weight 70.3 kg, SpO2 100 %. Constitutional: No distress . Vital signs reviewed. HEENT: EOMI, oral membranes moist Neck: supple Cardiovascular: RRR without murmur. No JVD    Respiratory/Chest: CTA Bilaterally without wheezes or rales. Normal effort    GI/Abdomen: BS +, non-tender, non-distended Ext: no clubbing, cyanosis, or edema Psych: pleasant and cooperative  Skin: crani incision CDI with scab, dressing stapled to scalp--still in place.   Neuro: Alert and oriented x 3. Normal insight and awareness. Intact Memory. Mild right central 7. No focal visual deficits. Speech somewhat fluent but has word finding deficits. RUE: ? Pecs, 1+/5 finger flexors, trace wrist. Similar today.  RLE 0/5 prox to distal. LUE and LLE 4/5. Pt with good sitting balance. Only minimal sensory difference to LT Left to Right. DTR's absent Musculoskeletal: Full ROM, No pain with PROM in the neck, trunk, or extremities. Posture appropriate.     Assessment/Plan: 1. Functional deficits which require 3+  hours per day of interdisciplinary therapy in a comprehensive inpatient rehab setting. Physiatrist is providing close team supervision and 24 hour management of active medical problems listed below. Physiatrist and rehab team continue to assess barriers to discharge/monitor patient progress toward functional and medical goals  Care Tool:  Bathing    Body parts bathed by patient: Right arm, Chest, Abdomen, Right upper leg, Left upper leg, Face   Body parts bathed by helper: Left arm, Buttocks, Front perineal area, Right lower leg, Left lower leg Body parts n/a: Front perineal area   Bathing assist Assist Level: Maximal Assistance - Patient 24 - 49%     Upper Body Dressing/Undressing Upper body dressing   What is the patient wearing?: Pull over shirt Orthosis activity level: Performed by helper  Upper body assist Assist Level: Moderate Assistance - Patient 50 - 74%    Lower Body Dressing/Undressing Lower body dressing      What is the patient wearing?: Incontinence brief     Lower body assist Assist for lower body dressing: Maximal Assistance - Patient 25 - 49%     Toileting Toileting    Toileting assist Assist for toileting: 2 Helpers  Transfers Chair/bed transfer  Transfers assist     Chair/bed transfer assist level: 2 Helpers     Locomotion Ambulation   Ambulation assist   Ambulation activity did not occur: Safety/medical concerns  Assist level: Dependent - Patient 0% Assistive device: Lite Gait Max distance: 180 ft   Walk 10 feet activity   Assist  Walk 10 feet activity did not occur: Safety/medical concerns  Assist level: Dependent - Patient 0% Assistive device: Lite Gait   Walk 50 feet activity   Assist Walk 50 feet with 2 turns activity did not occur: Safety/medical concerns  Assist level: Dependent - Patient 0% Assistive device: Lite Gait    Walk 150 feet activity   Assist Walk 150 feet activity did not occur: Safety/medical  concerns  Assist level: Dependent - Patient 0% Assistive device: Lite Gait    Walk 10 feet on uneven surface  activity   Assist Walk 10 feet on uneven surfaces activity did not occur: Safety/medical concerns         Wheelchair     Assist Will patient use wheelchair at discharge?: Yes Type of Wheelchair: Manual    Wheelchair assist level: Supervision/Verbal cueing Max wheelchair distance: 100 ft    Wheelchair 50 feet with 2 turns activity    Assist        Assist Level: Supervision/Verbal cueing   Wheelchair 150 feet activity     Assist      Assist Level: Maximal Assistance - Patient 25 - 49%   Blood pressure (!) 117/56, pulse 62, temperature 97.7 F (36.5 C), temperature source Oral, resp. rate 14, height '5\' 5"'$  (1.651 m), weight 70.3 kg, SpO2 100 %.  Medical Problem List and Plan: 1.  R hemiplegia secondary to Metastatic disease from melanoma s/p L frontal crani 05/01/21             -patient may  shower             -ELOS/Goals: 2-3 weeks - goals min A  -Continue CIR therapies including PT, OT, and SLP  2.  Age indeterminate DVT in right popliteal vein, and in bilateral posterior tibial and peroneal veins. Chronic DVT in right common femoral and femoral veins. Given age indeterminate DVTs and history of ICH.  Dr. Julien Nordmann  recommends prophylactic Lovenox- no IVC filter- resumption of Eliquis in a week or two. NSGY ok'ed starting prophylactic Lovenox this past weekend.                -antiplatelet therapy: N/A 3. Pain Management: Tylenol prn.  4. Mood: LCSW to follow for evaluation and support.              -antipsychotic agents: N/A 5. Neuropsych: This patient is capable of making decisions on her own behalf. 6. Skin/Wound Care:  Routine pressure relief measures.  7. Fluids/Electrolytes/Nutrition: encourage PO  -intake reasonable   -protein supp for albumin 8. Paroxymal A fib:/HTN: Monitor BP tid--continue metoprolol daily.             --Off Eliquis  due to hemorrhage 9. Glaucoma: Has been stable on timoptic--will resume.  10. Pre-diabetes: Hgb A1C-6.1. Diet liberalized to regular per patient/family request.             - monitor FBS weekly 11.  Leucocytosis: Likely due to steroid effect, DVT.              --no fevers or other signs of infection.  -21.4 8/1---still no signs of infection. Repeat Ucx negative 12.  Hyponatremia: Sodium up to 135  .  13. Urinary retention: volumes from I/C still 600+, did have lower one this morning  -increase urecholine to 50 mg tid  -will add flomax at night bp permitting  -OOB to toilet/bsc to void  -  14. Constipation: moved bowels this w/e   - scheduled miralax bid along with senna-s--moved bowels    LOS: 6 days A FACE TO FACE EVALUATION WAS PERFORMED  [name] 05/09/2021, 8:13 AM

## 2021-05-09 NOTE — Progress Notes (Signed)
Physical Therapy Session Note  Patient Details  Name: Angela Carpenter MRN: [id number] Date of Birth: [birth year]  Today's Date: 05/09/2021 PT Individual Time: 1322-1410 PT Individual Time Calculation (min): 48 min   Short Term Goals: Week 1:  PT Short Term Goal 1 (Week 1): Pt will perform squat pivot transfer to/from wc to L side w/mod assist of 1 PT Short Term Goal 2 (Week 1): pt will propel wc using hemi technique x 15f w/additional time and supervision PT Short Term Goal 3 (Week 1): pt will stand statically w/LUE support and min assist x 2 min LRAD/use of RLE blocking/bracing as needed PT Short Term Goal 4 (Week 1): supine to side to sit w/cga only  Skilled Therapeutic Interventions/Progress Updates:  Patient supine in bed on entrance to room. Patient alert and agreeable to PT session. Patient with no pain complaint during session. Pt's sister-in-law in room and requesting to discuss home measurements and videos/ pictures of pt's home with lead PT. Msg sent to lead PT re: information and request for visit to SIL. Meanwhile, SIL begins providing information to this PT and showing pictures/ videos. This PT providing personal recommendations but also deferring final judgement to lead PT. Also answers several questions from SIL re: types of DME, DME already available vs availability of items from pt's church. Recommended to pt and sister to allow for insurance to cover more expensive items and rely on church for smaller items. [place] and wants to make sure that it is updated to reflect actual primary and secondary insurances. Message sent to SW and charge nurse who relate that pt is perseverating on this info and has already related to SW.   Therapeutic Activity: Bed Mobility: Patient performed supine --> sit with ability to bring LLE off EOB and Mod A to bring RLE with vc for roll of hips/ trunk and shoulders toward EOB. Pt able to push up from bed surface to  reach seated position with ModA. However is sitting far from EOB. Requires Mod/ Max A for forward scoot toward EOB. She can initiate lean and quick movement for forward pelvic momentum but requires assist to complete scoot forward. VC/ tc required throughout for technique. Transfers: Patient performed STS at EOB with block to R knee to prevent buckle and provided support to RUE. Guided pt in squat pivot transfer EOB --> w/c to pt's L side with verbal instructions and visual demonstration prior. Requires Max A and 2 efforts to complete.   Neuromuscular Re-ed: NMR facilitated during session with focus on standing balance and increasing neuromotor control of muscle activation in RLE. Pt guided in standing weight shift, fwd/ bkwd stepping, and minisquats while standing EOB. Max A to maintain RLE extension and prevent buckling throughout. Pt with good effort and participation. Tolerates well. NMR performed for improvements in motor control and coordination, balance, sequencing, judgement, and self confidence/ efficacy in performing all aspects of mobility at highest level of independence.   Patient seated  in w/c at end of session with brakes locked, belt alarm set, and all needs within reach. W/C leg rest replaced for improved positioning of RLE. Ready for next therapy session in 138m.      Therapy Documentation Precautions:  Precautions Precautions: Fall Precaution Comments: R hemiparesis Required Braces or Orthoses: Sling Restrictions Weight Bearing Restrictions: Yes RLE Weight Bearing: Weight bearing as tolerated  Therapy/Group: Individual Therapy  JuAlger SimonsT, DPT 05/09/2021, 3:04 PM

## 2021-05-09 NOTE — Progress Notes (Signed)
Speech Language Pathology Daily Session Note  Patient Details  Name: Angela Carpenter MRN: [id number] Date of Birth: October 05, 1941  Today's Date: 05/09/2021 SLP Individual Time: 1030-1125 SLP Individual Time Calculation (min): 55 min  Short Term Goals: Week 1: SLP Short Term Goal 1 (Week 1): Pt will demonstrate funtional problem solving skills for safety awareness given min verbal cueing. SLP Short Term Goal 2 (Week 1): Pt will demonstrate use of word finding strategies to decrease anomia in conversation given min verbal cueing. SLP Short Term Goal 3 (Week 1): Pt will follow complex 2-step directions given min verbal cueing. SLP Short Term Goal 4 (Week 1): Pt will complete medication and financial management for complex problem solving with mod verbal cueing.  Skilled Therapeutic Interventions: Skilled treatment session focused on cognitive goals. Patient independently able to recall events from previous therapy with Mod I but did report mild confusion regarding time of day. SLP facilitated session by providing basic to mildly complex money and medication management tasks with focus on problem solving as well as the language component. Patient completed all tasks independently with extra time. Patient left upright in bed with alarm on and all needs within reach. Continue with current plan of care.      Pain No/Denies Pain   Therapy/Group: Individual Therapy  Zayra Devito 05/09/2021, 3:22 PM

## 2021-05-09 NOTE — Progress Notes (Signed)
Physical Therapy Session Note  Patient Details  Name: Angela Carpenter MRN: [id number] Date of Birth: 1941/08/14  Today's Date: 05/09/2021 PT Individual Time: 1424-1504 PT Individual Time Calculation (min): 40 min   Short Term Goals: Week 1:  PT Short Term Goal 1 (Week 1): Pt will perform squat pivot transfer to/from wc to L side w/mod assist of 1 PT Short Term Goal 2 (Week 1): pt will propel wc using hemi technique x 152f w/additional time and supervision PT Short Term Goal 3 (Week 1): pt will stand statically w/LUE support and min assist x 2 min LRAD/use of RLE blocking/bracing as needed PT Short Term Goal 4 (Week 1): supine to side to sit w/cga only  Skilled Therapeutic Interventions/Progress Updates:     [facility]. No complaint of pain. WC transport to gym for time management. Pt performs squat pivot transfer to mat table with heavy modA and cues for body mechanics and sequencing. Pt performs sit to stand from edge of mat with maxA, largely due to posterior lean and lack of weight transition anteriorly during transfer. In standing pt cued to step toward targets place in front and also lateral to pt's L foot. Pt shuffles foot and does not adequately weight shift to the R to clear L leg, also presenting with consistent posterior lean. After several attempts pt not improving balance or body mechanics. PT then places blue platform between pt's legs and has pt reach forward from seated position to retrieve cups placed at end of platform. Pt demos good forward trunk lean, weight shift, and hip flexion to perform task. PT takes picture of pt in forward flexion reaching for cup, and then takes picture of pt attempting sit to stand with posterior bias. Shown to pt to demonstrate body position required for safe transfer. PT places cup between pt's legs in sitting and has pt reach for cup, then stand up directly with COG over BOS and pt demos much improved efficiency,  requiring light modA to complete. [facility]. Left seated in WC with alarm intact and all needs within reach.  Therapy Documentation Precautions:  Precautions Precautions: Fall Precaution Comments: R hemiparesis Required Braces or Orthoses: Sling Restrictions Weight Bearing Restrictions: Yes RLE Weight Bearing: Weight bearing as tolerated    Therapy/Group: Individual Therapy  WBreck Coons PT, DPT 05/09/2021, 4:01 PM

## 2021-05-10 ENCOUNTER — Other Ambulatory Visit: Payer: Medicare Other

## 2021-05-10 ENCOUNTER — Ambulatory Visit: Payer: Medicare Other | Admitting: Oncology

## 2021-05-10 ENCOUNTER — Ambulatory Visit: Payer: Medicare Other

## 2021-05-10 ENCOUNTER — Ambulatory Visit: Payer: Medicare Other | Admitting: Physician Assistant

## 2021-05-10 NOTE — Progress Notes (Signed)
Occupational Therapy Session Note  Patient Details  Name: Angela Carpenter MRN: [id number] Date of Birth: [birth year]  Today's Date: 05/10/2021 OT Group Time: 1100-1202 OT Group Time Calculation (min): 62 min   Short Term Goals: Week 1:  OT Short Term Goal 1 (Week 1): Pt will don shirt wiht MOD A OT Short Term Goal 2 (Week 1): Pt will recall hemi strategies wiht MIN VC OT Short Term Goal 3 (Week 1): Pt will thread 1LE into pants OT Short Term Goal 4 (Week 1): Pt will groom in unsupported sitting wiht MIN A  Skilled Therapeutic Interventions/Progress Updates:  Pt participate in group session with a focus on therapeutic activity of Bowling to facilitate magagement of w/c, hand-eye- coordination, UB strength, social interaction, and increasing activity tolerance. Pt completed bowling turns from sitting in w/c with LUE, pt needed total A for w/c mgmt d/t R hemiparesis. Pt with no c/o pain during session, pt actively interacting with other group members by  providing encouragement to other group members. Pt transported back to room by RT.   Therapy Documentation Precautions:  Precautions Precautions: Fall Precaution Comments: R hemiparesis Required Braces or Orthoses: Sling Restrictions Weight Bearing Restrictions: Yes RLE Weight Bearing: Weight bearing as tolerated   Pain: Pt reports no pain during session.    Therapy/Group: Group Therapy  Precious Haws 05/10/2021, 12:26 PM

## 2021-05-10 NOTE — Progress Notes (Signed)
Physical Therapy Session Note  Patient Details  Name: Angela Carpenter MRN: [id number] Date of Birth: 06-10-1941  Today's Date: 05/10/2021 PT Individual Time: 0805-0850 PT Individual Time Calculation (min): 45 min   Short Term Goals: Week 1:  PT Short Term Goal 1 (Week 1): Pt will perform squat pivot transfer to/from wc to L side w/mod assist of 1 PT Short Term Goal 2 (Week 1): pt will propel wc using hemi technique x 17f w/additional time and supervision PT Short Term Goal 3 (Week 1): pt will stand statically w/LUE support and min assist x 2 min LRAD/use of RLE blocking/bracing as needed PT Short Term Goal 4 (Week 1): supine to side to sit w/cga only  Skilled Therapeutic Interventions/Progress Updates:     Patient in bed with her sister-in-law upon PT arrival. Patient alert and agreeable to PT session. Patient denied pain during session.  Therapeutic Activity: Bed Mobility: Patient performed supine to sit with min A for R upper and lower extremity management. Provided verbal cues for log roll technique and use of L elbow to push up to sitting. Transfers: Patient performed a toilet transfer [facility] the [facility] due to urgency. Performed standing with Stedy from bed and BSC over toilet with min-mod A for boosting up and [facility] Provided cues for scooting forward, forward weight shift, and glute and quad activation for boosting up on R. Patient was continent of bladder with increased time during toileting, provided cues for relaxation and running water for inspiration. Reviewed home set-up pictures and videos with patient's sister-in-law during toileting. Performed total A for peri-care and lower body clothing management during toileting. She performed sit to/from stand x1 with RW with R hand splint with mod A with cues as above and hand-over-hand assist for R hand placement.   Gait Training:  Patient ambulated 37 feet using RW with R hand splint and R DF wrap with mod  A and max A for R limb advancement. Ambulated with decreased weight shift to off weight opposite foot bilaterally, decreased knee/hip extension in stance on R with intermittent blocking to prevent excessive knee flexion or buckling, decreased R DF and foot clearance, R posterior rotated pelvis, and decreased hamstring activation in pre-swing. Provided verbal cues for sequencing, weight shifting, and reciprocal gait pattern.  Patient in w/c with R lap tray and pillow to elevate R upper extremity and her sister-in-law in the room at end of session with breaks locked, seat belt alarm set, and all needs within reach.   Therapy Documentation Precautions:  Precautions Precautions: Fall Precaution Comments: R hemiparesis Required Braces or Orthoses: Sling Restrictions Weight Bearing Restrictions: Yes RLE Weight Bearing: Weight bearing as tolerated   Therapy/Group: Individual Therapy  Taiana Temkin L Velma Hanna PT, DPT  05/10/2021, 12:18 PM

## 2021-05-10 NOTE — Progress Notes (Signed)
Speech Language Pathology Weekly Progress and Session Note  Patient Details  Name: Angela Carpenter MRN: [id number] Date of Birth: 1941-06-26  Beginning of progress report period: May 03, 2021 End of progress report period: May 10, 2021  Today's Date: 05/10/2021 SLP Individual Time: 1345-1425 SLP Individual Time Calculation (min): 40 min  Short Term Goals: Week 1: SLP Short Term Goal 1 (Week 1): Pt will demonstrate funtional problem solving skills for safety awareness given min verbal cueing. SLP Short Term Goal 1 - Progress (Week 1): Met SLP Short Term Goal 2 (Week 1): Pt will demonstrate use of word finding strategies to decrease anomia in conversation given min verbal cueing. SLP Short Term Goal 2 - Progress (Week 1): Met SLP Short Term Goal 3 (Week 1): Pt will follow complex 2-step directions given min verbal cueing. SLP Short Term Goal 3 - Progress (Week 1): Met SLP Short Term Goal 4 (Week 1): Pt will complete medication and financial management for complex problem solving with mod verbal cueing. SLP Short Term Goal 4 - Progress (Week 1): Met    New Short Term Goals: Week 2: SLP Short Term Goal 1 (Week 2): Pt will complete medication and financial management for complex problem solving with min verbal cueing. SLP Short Term Goal 2 (Week 2): Patient will demonstrate alternating attention between 2 functional tasks for 30 minutes with Min verbal cues for redirection. SLP Short Term Goal 3 (Week 2): Pt will demonstrate use of word finding strategies to decrease anomia in structured language tasks given min verbal cueing. SLP Short Term Goal 4 (Week 2): Patient will follow multi-step commands with Min verbal cues.  Weekly Progress Updates: Patient has made excellent gains and has met 4 of 4 STGs this reporting period. Currently, patient is overall Mod I for word-finding during a complex conversation, however, decreased word-finding is noted during more structured language tasks requiring  Mod verbal cues. Patient demonstrates improved cognitive functioning and requires overall supervision level verbal cues for sustained attention and basic problem solving with focus on higher-level tasks this upcoming week. Patient and family education ongoing. Patient would benefit from continued skilled SLP intervention to maximize her cognitive-linguistic functioning prior to discharge.      Intensity: Minumum of 1-2 x/day, 30 to 90 minutes Frequency: 3 to 5 out of 7 days Duration/Length of Stay: 05/25/21 Treatment/Interventions: Cognitive remediation/compensation;Environmental controls;Multimodal communication approach;Speech/Language facilitation;Cueing hierarchy;Functional tasks;Therapeutic Activities;Internal/external aids;Patient/family education   Daily Session  Skilled Therapeutic Interventions:   Skilled treatment session focused on cognitive goals. SLP facilitated session by providing extra time and Min verbal cues for patient to navigate her ipad. Patient read complex newspaper articles without errors and with intact comprehension. Patient's sister-in-law present and educated regarding patient's current cognitive-linguistic deficits and goals of skilled SLP intervention. Patient left upright in wheelchair with alarm on and all needs within reach. Continue with current plan of care.    Pain No/Denies Pain   Therapy/Group: Individual Therapy  Talon Regala 05/10/2021, 6:32 AM

## 2021-05-10 NOTE — Progress Notes (Signed)
PROGRESS NOTE   Subjective/Complaints: Bladder starting to empty. No new problems. Right arm continues to demonstrate improvement  ROS: Patient denies fever, rash, sore throat, blurred vision, nausea, vomiting, diarrhea, cough, shortness of breath or chest pain, joint or back pain, headache, or mood change.    Objective:   No results found. No results for input(s): WBC, HGB, HCT, PLT in the last 72 hours.  No results for input(s): NA, K, CL, CO2, GLUCOSE, BUN, CREATININE, CALCIUM in the last 72 hours.   Intake/Output Summary (Last 24 hours) at 05/10/2021 1358 Last data filed at 05/10/2021 0735 Gross per 24 hour  Intake 420 ml  Output 500 ml  Net -80 ml        Physical Exam: Vital Signs Blood pressure (!) 102/56, pulse 70, temperature 98 F (36.7 C), temperature source Oral, resp. rate 16, height '5\' 5"'$  (1.651 m), weight 70.3 kg, SpO2 100 %. Constitutional: No distress . Vital signs reviewed. HEENT: NCAT, EOMI, oral membranes moist Neck: supple Cardiovascular: RRR without murmur. No JVD    Respiratory/Chest: CTA Bilaterally without wheezes or rales. Normal effort    GI/Abdomen: BS +, non-tender, non-distended Ext: no clubbing, cyanosis, or edema Psych: pleasant and cooperative  Skin: crani incision CDI with scab, gauze off.  Neuro: Alert and oriented x 3. Normal insight and awareness. Intact Memory. Mild right central 7. No focal visual deficits. Speech somewhat fluent but has word finding deficits. RUE: tr Pecs, 1+ EE, 1+/5 finger flexors, trace wrist. Similar today.  RLE 0/5 prox to distal. LUE and LLE 4/5. Pt with good sitting balance. Only minimal sensory difference to LT Left to Right. DTR's absent Musculoskeletal: Full ROM, No pain with PROM in the neck, trunk, or extremities. Posture appropriate.     Assessment/Plan: 1. Functional deficits which require 3+ hours per day of interdisciplinary therapy in a  comprehensive inpatient rehab setting. Physiatrist is providing close team supervision and 24 hour management of active medical problems listed below. Physiatrist and rehab team continue to assess barriers to discharge/monitor patient progress toward functional and medical goals  Care Tool:  Bathing    Body parts bathed by patient: Right arm, Chest, Abdomen, Right upper leg, Left upper leg, Face   Body parts bathed by helper: Left arm, Buttocks, Front perineal area, Right lower leg, Left lower leg Body parts n/a: Front perineal area   Bathing assist Assist Level: Maximal Assistance - Patient 24 - 49%     Upper Body Dressing/Undressing Upper body dressing   What is the patient wearing?: Pull over shirt Orthosis activity level: Performed by helper  Upper body assist Assist Level: Moderate Assistance - Patient 50 - 74%    Lower Body Dressing/Undressing Lower body dressing      What is the patient wearing?: Incontinence brief     Lower body assist Assist for lower body dressing: Maximal Assistance - Patient 25 - 49%     Toileting Toileting    Toileting assist Assist for toileting: 2 Helpers     Transfers Chair/bed transfer  Transfers assist     Chair/bed transfer assist level: Moderate Assistance - Patient 50 - 74% Chair/bed transfer assistive device: Environmental consultant  Locomotion Ambulation   Ambulation assist   Ambulation activity did not occur: Safety/medical concerns  Assist level: Moderate Assistance - Patient 50 - 74% Assistive device: Walker-rolling Max distance: 37 ft   Walk 10 feet activity   Assist  Walk 10 feet activity did not occur: Safety/medical concerns  Assist level: Moderate Assistance - Patient - 50 - 74% Assistive device: Walker-rolling   Walk 50 feet activity   Assist Walk 50 feet with 2 turns activity did not occur: Safety/medical concerns  Assist level: Dependent - Patient 0% Assistive device: Lite Gait    Walk 150 feet  activity   Assist Walk 150 feet activity did not occur: Safety/medical concerns  Assist level: Dependent - Patient 0% Assistive device: Lite Gait    Walk 10 feet on uneven surface  activity   Assist Walk 10 feet on uneven surfaces activity did not occur: Safety/medical concerns         Wheelchair     Assist Will patient use wheelchair at discharge?: Yes Type of Wheelchair: Manual    Wheelchair assist level: Supervision/Verbal cueing Max wheelchair distance: 100 ft    Wheelchair 50 feet with 2 turns activity    Assist        Assist Level: Supervision/Verbal cueing   Wheelchair 150 feet activity     Assist      Assist Level: Maximal Assistance - Patient 25 - 49%   Blood pressure (!) 102/56, pulse 70, temperature 98 F (36.7 C), temperature source Oral, resp. rate 16, height '5\' 5"'$  (1.651 m), weight 70.3 kg, SpO2 100 %.  Medical Problem List and Plan: 1.  R hemiplegia secondary to Metastatic disease from melanoma s/p L frontal crani 05/01/21             -patient may  shower             -ELOS/Goals: 2-3 weeks - goals min A  -Continue CIR therapies including PT, OT, and SLP  2.  Age indeterminate DVT in right popliteal vein, and in bilateral posterior tibial and peroneal veins. Chronic DVT in right common femoral and femoral veins. Given age indeterminate DVTs and history of ICH.  Dr. Julien Nordmann  recommends prophylactic Lovenox- no IVC filter- resumption of Eliquis in a week or two. NSGY ok'ed starting prophylactic Lovenox                -antiplatelet therapy: N/A 3. Pain Management: Tylenol prn.  4. Mood: LCSW to follow for evaluation and support.              -antipsychotic agents: N/A 5. Neuropsych: This patient is capable of making decisions on her own behalf. 6. Skin/Wound Care:  Routine pressure relief measures.  7. Fluids/Electrolytes/Nutrition: encourage PO  -intake reasonable   -protein supp for albumin 8. Paroxymal A fib:/HTN: Monitor BP  tid--continue metoprolol daily.             --Off Eliquis due to hemorrhage  -HR controlled 9. Glaucoma: Has been stable on timoptic--will resume.  10. Pre-diabetes: Hgb A1C-6.1. Diet liberalized to regular per patient/family request.             - monitor FBS weekly 11.  Leucocytosis: Likely due to steroid effect, DVT.              --no fevers or other signs of infection.  -21.4 8/1---still no signs of infection. Repeat Ucx negative 12. Hyponatremia: Sodium up to 135  .  13. Urinary retention: seems to be emptying  today.  -increased urecholine to 50 mg tid  - lomax at night bp permitting  -OOB to toilet/bsc to void  -  14. Constipation: moved bowels this w/e   - scheduled miralax bid along with senna-s--moving bowels    LOS: 7 days A FACE TO FACE EVALUATION WAS PERFORMED  Angela Carpenter 05/10/2021, 1:58 PM

## 2021-05-10 NOTE — Plan of Care (Signed)
  Problem: RH BLADDER ELIMINATION Goal: RH STG MANAGE BLADDER WITH ASSISTANCE Description: STG Manage Bladder With Min Assistance Outcome: Progressing   Problem: RH SAFETY Goal: RH STG ADHERE TO SAFETY PRECAUTIONS W/ASSISTANCE/DEVICE Description: STG Adhere to Safety Precautions With Min Assistance/Device. Outcome: Progressing Goal: RH STG DECREASED RISK OF FALL WITH ASSISTANCE Description: STG Decreased Risk of Fall With Cues and Reminders. Outcome: Progressing   Problem: RH COGNITION-NURSING Goal: RH STG USES MEMORY AIDS/STRATEGIES W/ASSIST TO PROBLEM SOLVE Description: STG Uses Memory Aids/Strategies With Cues and Reminders to Problem Solve. Outcome: Progressing   Problem: RH PAIN MANAGEMENT Goal: RH STG PAIN MANAGED AT OR BELOW PT'S PAIN GOAL Description: < 3 on a 0-10 pain scale. Outcome: Progressing

## 2021-05-10 NOTE — Progress Notes (Signed)
Inpatient Rehabilitation Care Coordinator Assessment and Plan Patient Details  Name: Angela Carpenter MRN: [id number] Date of Birth: 29-Jun-1941  Today's Date: 05/10/2021  Hospital Problems: Principal Problem:   Metastatic cancer to brain [facility]) Active Problems:   Seizure disorder, focal motor ([place])   Right hemiplegia [facility])  Past Medical History:  Past Medical History:  Diagnosis Date   A-fib [facility])    Atrial fibrillation ([place])    4 years ago started   Brain cancer ([place]) 12/2020   Breast cancer ([place]) 2006   Colon polyps    Glaucoma    Hypercholesteremia    Hypertension    patient denies    Osteoporosis    Personal history of chemotherapy 2006   Personal history of radiation therapy 2006   Stroke (cerebrum) ([place]) 2015   Past Surgical History:  Past Surgical History:  Procedure Laterality Date   APPLICATION OF CRANIAL NAVIGATION Left 05/01/2021   Procedure: APPLICATION OF CRANIAL NAVIGATION;  Surgeon: Consuella Lose, MD;  Location: [place];  Service: Neurosurgery;  Laterality: Left;   AUGMENTATION MAMMAPLASTY Bilateral 2006   Patient had them removed in 2008   BREAST BIOPSY Left 2016   BREAST LUMPECTOMY Right 2006   COLONOSCOPY     had polyps   CRANIOTOMY Left 05/01/2021   Procedure: STEREOTACTIC FRONTAL CRANIOTOMY TUMOR EXCISION;  Surgeon: Consuella Lose, MD;  Location: [place];  Service: Neurosurgery;  Laterality: Left;   EYE SURGERY Right    cataract   MELANOMA EXCISION WITH SENTINEL LYMPH NODE BIOPSY N/A 06/02/2019   Procedure: Left lateral ANAL MELANOMA EXCISION WITH Bilateral Groin exploration and SENTINEL node mapping;  Surgeon: [name], MD;  Location: [place];  Service: General;  Laterality: N/A;   TONSILLECTOMY     Social History:  reports that she is a non-smoker but has been exposed to tobacco smoke. She has never used smokeless tobacco. She reports that she does not drink alcohol and does not use drugs.  Family / Support Systems Marital Status:  Widow/Widower How Long?: 5 years ago Patient Roles: Parent Spouse/Significant Other: Widowed Children: 1 adult son Angela Carpenter [phone] Other Supports: son Angela Carpenter, and support from brother Angela Carpenter and sister in Sports coach, sister Angela Carpenter: son Angela Carpenter Ability/Limitations of Carpenter: son works from home, and will have support from her siblings. Carpenter Availability: 24/7 Family Dynamics: Pt lives alone.  Social History Preferred language: English Religion: Methodist Cultural Background: Pt worked as an IT trainer for special ed. students for 26 years. 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 Pt adjusting to current condition. Recent Psychosocial Issues: Denies Psychiatric History: Denies Substance Abuse History: Denies  Patient / Family Perceptions, Expectations & Goals Pt/Family understanding of illness & functional limitations: Pt and family have a general understanding of care needs Premorbid pt/family roles/activities: Independent Angela changes in roles/activities/participation: Assistance with ADLs/IADLs Pt/family expectations/goals: Pt goal  is " to get out of here, to be as independent as possible, and get strength back."  [employer]: None Premorbid Home Care/DME Agencies: None Transportation available at discharge: TBD Resource referrals recommended: Neuropsychology  Discharge Planning Living  Arrangements: Alone  Support Systems: Children, Other relatives Type of Residence: Private residence Insurance Resources: [employer], Multimedia programmer (specify) Sports administrator) Financial Resources: Fish farm manager, Other (Comment) Financial Screen Referred: No Living Expenses: Own Money Management: Patient Does the patient have any problems obtaining your medications?: No Home Management: Pt managed all homecare needs Patient/Family Preliminary Plans: TBD Care Coordinator Barriers to Discharge: Decreased Carpenter support, Lack of/limited family support Care Coordinator Angela Follow Up Needs: HH/OP  Clinical Impression SW met with pt and pt dtr Angela Carpenter in room to introduce self, explain role, and discuss discharge process. Pt is a [employer] and in process of completing [place] citizenship. Pt is not a English as a second language teacher. HCPOA- dtr Angela Carpenter. DME- cane, RW, w/c, stair lift, bath lift, hand railings and bed rails on side of bed and foot of bed.  Angela Carpenter A Angela Carpenter 05/10/2021, 1:38 PM

## 2021-05-10 NOTE — Progress Notes (Signed)
Occupational Therapy Weekly Progress Note  Patient Details  Name: Angela Carpenter MRN: [id number] Date of Birth: 03-12-1941  Beginning of progress report period: May 04, 2021 End of progress report period: May 10, 2021  Today's Date: 05/10/2021 OT Individual Time: 1300-1330 OT Individual Time Calculation (min): 30 min    Patient has met 4 of 4 short term goals.  Pt is making steady progress towards goals improving sitting balance to S, RUE activation in hand, and recalling hemi strategies with cuing. Pt continues to demo increased tone in RLE and R hemiplegia impacting ADL performance. Pt continues to motivated to improve performance in prep for DC  Patient continues to demonstrate the following deficits: muscle weakness, decreased cardiorespiratoy endurance, abnormal tone, unbalanced muscle activation, motor apraxia, decreased coordination, and decreased motor planning, decreased attention, decreased awareness, decreased problem solving, decreased safety awareness, and decreased memory, and decreased standing balance, decreased postural control, hemiplegia, and decreased balance strategies and therefore will continue to benefit from skilled OT intervention to enhance overall performance with BADL and Reduce care partner burden.  Patient progressing toward long term goals..  Continue plan of care.  OT Short Term Goals Week 1:  OT Short Term Goal 1 (Week 1): Pt will don shirt wiht MOD A OT Short Term Goal 1 - Progress (Week 1): Met OT Short Term Goal 2 (Week 1): Pt will recall hemi strategies wiht MIN VC OT Short Term Goal 2 - Progress (Week 1): Met OT Short Term Goal 3 (Week 1): Pt will thread 1LE into pants OT Short Term Goal 3 - Progress (Week 1): Met OT Short Term Goal 4 (Week 1): Pt will groom in unsupported sitting wiht MIN A OT Short Term Goal 4 - Progress (Week 1): Met Week 2:  OT Short Term Goal 1 (Week 2): Pt will complete UB dressing with MIN A OT Short Term Goal 2 (Week 2): Pt  will thread BLE into pants with A for LE positioning only PRN OT Short Term Goal 3 (Week 2): Pt will transfer consistently to toilet with MOD A and LRAD OT Short Term Goal 4 (Week 2): Pt will recall adapative bathing strategies with MIN cuing and supervision  Skilled Therapeutic Interventions/Progress Updates:     Pt received in w/c with no pain. SIL, Marlowe Kays, with video of home set up. ADL:  Pt completes bathing with HOH A to incorporate LUE into BADL tasks and MOD A Overall for bathing at shower level seated on TTB Pt completes donning gown with MAX A d/t time management but able to recall hemi strategy to doff shirt with S and MIN VC Pt completes footwear with total A d/t time management  Pt completes toileting with MAX A in standing in stedy for clothing maangment and MIN A for hygiene. Pt able to void bladder only Pt completes toileting transfer with MOD A for STS in stedy with VC for anterior weight shift improving STS to MIN A  Pt completes shower/Tub transfer with as stated above to TTB  Pt left at end of session in bed with exit alarm on, call light in reach and all needs met   Therapy Documentation Precautions:  Precautions Precautions: Fall Precaution Comments: R hemiparesis Required Braces or Orthoses: Sling Restrictions Weight Bearing Restrictions: Yes RLE Weight Bearing: Weight bearing as tolerated General:   Vital Signs: Therapy Vitals Temp: 98 F (36.7 C) Temp Source: Oral Pulse Rate: 66 Resp: 16 BP: (!) 103/52 Patient Position (if appropriate): Lying Oxygen Therapy SpO2: 97 %  O2 Device: Room Air Pain:   ADL: ADL Grooming: Moderate assistance Where Assessed-Grooming: Edge of bed Upper Body Bathing: Moderate assistance Where Assessed-Upper Body Bathing: Edge of bed Lower Body Bathing: Maximal assistance Where Assessed-Lower Body Bathing: Edge of bed Upper Body Dressing: Maximal assistance Where Assessed-Upper Body Dressing: Edge of bed Lower Body  Dressing: Maximal assistance Where Assessed-Lower Body Dressing: Edge of bed Toileting: Maximal assistance Where Assessed-Toileting: Bedside Commode (stedy) Toilet Transfer: Maximal verbal cueing Toilet Transfer Method: Other (comment) (stedy) Tub/Shower Transfer: Not assessed Vision   Perception    Praxis   Exercises:   Other Treatments:     Therapy/Group: Individual Therapy  Tonny Branch 05/10/2021, 6:53 AM

## 2021-05-11 ENCOUNTER — Telehealth: Payer: Self-pay

## 2021-05-11 DIAGNOSIS — G40109 Localization-related (focal) (partial) symptomatic epilepsy and epileptic syndromes with simple partial seizures, not intractable, without status epilepticus: Secondary | ICD-10-CM

## 2021-05-11 NOTE — Progress Notes (Signed)
Occupational Therapy Session Note  Patient Details  Name: Angela Carpenter MRN: [id number] Date of Birth: 07/21/1941  Today's Date: 05/11/2021 OT Individual Time: 2440-1027 OT Individual Time Calculation (min): 65 min   Session 2 OT Individual Time: 2536-6440 OT Individual Time Calculation (min): 72 min    Short Term Goals: Week 2:  OT Short Term Goal 1 (Week 2): Pt will complete UB dressing with MIN A OT Short Term Goal 2 (Week 2): Pt will thread BLE into pants with A for LE positioning only PRN OT Short Term Goal 3 (Week 2): Pt will transfer consistently to toilet with MOD A and LRAD OT Short Term Goal 4 (Week 2): Pt will recall adapative bathing strategies with MIN cuing and supervision  Skilled Therapeutic Interventions/Progress Updates:   Pt received supine with no c/o pain, excited about progress of voiding urine. Pt completed bed mobility with mod A, to advance RLE and to scoot hips forward. Sit > stand with mod A, improved power up, pt with poor carryover of hand placement on RW. Mod A stand pivot transfer to the w/c. 5 steps of functional mobility into the shower with mod A, mod-max cueing for sequencing turn and mod facilitation for RLE advancement. Pt completed all UB bathing with compensatory techniques. Mod A for LB bathing standing, mod A to power up from TTB with B grab bar use. Very careful management of water to wash pt's hair, keeping incision dry the entire time. Pt requires cueing for initiation at times. She donned shirt with min A. Pants with max A d/t time constraints. Pt completed 10 ft of functional mobility with the RW and mod A for RLE advancement but only min A for standing balance. Pt was left sitting up in the w/c with all needs met. Chair alarm set.   Session 2: Pt in w/c ready for session. Pt taken to therapy gym. 5 ft of functional mobility with mod facilitation for the RLE and only min A for balance. Pt transferred to supine on the mat with min A. From supine pt  completed overhead (shoulder supported) elbow extensions with 2/5 tricep and anterior deltoid activation!! From sidelying, elbow flexion/extension with cueing and tactile input completed. Pt required facilitation to bring bicep out of lengthened position but was then able to move through full range! Full tricep extension with guiding, min facilitation. From seated pt completed shoulder flexion/extension with forward press onto mirror to provide increased visual input. Pt required max facilitation. Pt returned to her room and completed toileting tasks, mod A for stand pivot and mod A for toileting tasks. Pt left sitting up in the w/c with all needs met, chair alarm set.   Unattended e-stim: Saebo Stim One placed on pt's R shoulder for joint approximation. No c/o pain. Slight redness on skin following removal but this resolved within 5 minutes.  330 pulse width 35 Hz pulse rate On 8 sec/ off 8 sec Ramp up/ down 2 sec Symmetrical Biphasic wave form  Max intensity 172m at 500 Ohm load  Therapy Documentation Precautions:  Precautions Precautions: Fall Precaution Comments: R hemiparesis Required Braces or Orthoses: Sling Restrictions Weight Bearing Restrictions: Yes RLE Weight Bearing: Weight bearing as tolerated  Therapy/Group: Individual Therapy  SCurtis Sites8/02/2021, 8:53 AM

## 2021-05-11 NOTE — Consult Note (Signed)
Neuropsychological Consultation   Patient:   Angela Carpenter   DOB:   1941/02/21  MR Number:  [id number]  Location:  [facility] [place] [id number] [place] [address] Dept: [place]: [phone]           Date of Service:   05/11/2021  Start Time:   10 AM End Time:   11 AM  Provider/Observer:  Ilean Skill, Psy.D.       Clinical Neuropsychologist       Billing Code/Service: [phone]  Chief Complaint:    Angela Carpenter is a 80 year old female with history of metastatic melanoma to brain with hemorrhagic metastasis to the left frontal lobe and residual right-sided weakness status post SRS, breast cancer, A. fib.  Patient was admitted on 04/23/2021 with 4-week of progression of right-sided weakness to flaccidity with difficulty walking as well as slurred speech.  MRI brain done showed significant interval increase in size of hemorrhagic left frontal lobe metastasis with increased surrounding edema and local mass-effect.  Surgical resection was implemented for better disease control and scheduled for 05/01/2021.  Patient was found postsurgical to have mild expressive impairments with word finding difficulties and paraphasias.  Patient had generalized seizure activity on 7/24 and Keppra was increased.  Repeat CT of head was stable without increase in size of hemorrhagic lesion.  Patient did undergo stereotactic left frontal craniotomy for resection of tumor on 7/26.  Reason for Service:  Patient was referred due to coping and adjustment issues around cancer and recent surgical interventions for hemorrhagic metastasis.  Below is the HPI for the current admission.  HPI: Angela Carpenter is a 80 year old RH female with history of 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 Fib (off Eliquis since 11/2020) who was  admitted on 04/23/2021 with 4 week of progression of right-sided weakness to flaccidity with difficulty walking as well as slurred speech.  MRI brain done showing significant interval increase in size of hemorrhagic left frontal lobe metastases with increased surrounding vasogenic edema and local mass-effect.  She was started on IV Decadron with serial CT of head for monitoring per neurology input.  Beryle Flock was discontinued and case was discussed by   Dr. Alen Blew with multidisciplinary cancer team.  Surgical resection for better disease control was recommended and scheduled for 07/26. Therapy has been ongoing during her stay and she was found to have mild expressive impairments with word finding deficits and paraphasias.  PT/OT has been working on bed mobility and standing attempts in steady. She had generalized seizure activity on 07/24 and Keppra was increased to 750 mg twice daily.  Repeat CT of head was stable without increase in size of hemorrhagic lesion.  EEG done showing intermittent slowing in left temporal region likely secondary to underlying hemorrhage and no seizures. She underwent stereotactic left frontal craniotomy for resection of tumor by Dr. Kathyrn Sheriff on 06/26.  To continue decadron with taper on outpatient basis. Therapy re-evals completed showing ongoing deficits due to right hemiplegia, difficulty following multi step commands as well as difficulty with ADL tasks. CIR recommended due to functional decline.  Current Status:  The patient was awake and alert sitting in her wheelchair upon entering the room.  Patient's brother was also present in the room but not involved in the discussions.  The patient was oriented and described good mood  although acknowledged a previous day in therapy of getting very frustrated with inability to complete some of the tasks but this was very situationally driven.  Patient denied any significant worsening or issues with major depressive types of symptoms.   Patient motivated to improve in therapies and is looking forward to returning home.  A question will be made as to whether she can continue to live alone or needing some type of greater assistance going forward.  Patient felt that she was recovering well and her expressive language functions clearly have improved postsurgically.  She continues to have significant motor deficits.  Behavioral Observation: Angela Carpenter  presents as a 80 y.o.-year-old Right handed Caucasian Female who appeared her stated age. her dress was Appropriate and she was Well Groomed and her manners were Appropriate to the situation.  her participation was indicative of Appropriate behaviors.  There were physical disabilities noted.  she displayed an appropriate level of cooperation and motivation.     Interactions:    Active Appropriate  Attention:   abnormal and attention span appeared shorter than expected for age  Memory:   within normal limits; recent and remote memory intact  Visuo-spatial:  not examined  Speech (Volume):  low  Speech:   normal; some mild slowing and expressive language but no clear significant expressive language deficits.  Thought Process:  Coherent and Relevant  Though Content:  WNL; not suicidal and not homicidal  Orientation:   person, place, time/date, and situation  Judgment:   Fair  Planning:   Fair  Affect:    Flat  Mood:    Dysphoric  Insight:   Good  Intelligence:   normal  Medical History:   Past Medical History:  Diagnosis Date   A-fib ([place])    Atrial fibrillation ([place])    4 years ago started   Brain cancer ([place]) 12/2020   Breast cancer ([place]) 2006   Colon polyps    Glaucoma    Hypercholesteremia    Hypertension    patient denies    Osteoporosis    Personal history of chemotherapy 2006   Personal history of radiation therapy 2006   Stroke (cerebrum) [facility]) 2015         Patient Active Problem List   Diagnosis Date Noted   Metastatic cancer to brain ([place])  05/03/2021   Right hemiplegia ([place]) 05/03/2021   Hemiparesis ([place])    Metastasis to brain ([place]) 12/12/2020   Seizure disorder, focal motor ([place]) 12/12/2020   ICH (intracerebral hemorrhage) ([place]) 12/11/2020   Encounter for antineoplastic immunotherapy 06/30/2020   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  Family Med/Psych History:  Family History  Problem Relation Age of Onset   Lung cancer Mother    Hypotension Mother    Pancreatic cancer Father    Hypertension Sister    Kidney cancer Brother    Cirrhosis Maternal Grandfather    Breast cancer Paternal Grandmother    Cirrhosis Paternal Grandfather    Breast cancer Paternal Aunt     Impression/DX:  Angela Carpenter is a 80 year old female with history of metastatic melanoma to brain with hemorrhagic metastasis to the left frontal lobe and residual right-sided weakness status post SRS, breast cancer, A. fib.  Patient was admitted on  04/23/2021 with 4-week of progression of right-sided weakness to flaccidity with difficulty walking as well as slurred speech.  MRI brain done showed significant interval increase in size of hemorrhagic left frontal lobe metastasis with increased surrounding edema and local mass-effect.  Surgical resection was implemented for better disease control and scheduled for 05/01/2021.  Patient was found postsurgical to have mild expressive impairments with word finding difficulties and paraphasias.  Patient had generalized seizure activity on 7/24 and Keppra was increased.  Repeat CT of head was stable without increase in size of hemorrhagic lesion.  Patient did undergo stereotactic left frontal craniotomy for resection of tumor on 7/26.  The patient was  awake and alert sitting in her wheelchair upon entering the room.  Patient's brother was also present in the room but not involved in the discussions.  The patient was oriented and described good mood although acknowledged a previous day in therapy of getting very frustrated with inability to complete some of the tasks but this was very situationally driven.  Patient denied any significant worsening or issues with major depressive types of symptoms.  Patient motivated to improve in therapies and is looking forward to returning home.  A question will be made as to whether she can continue to live alone or needing some type of greater assistance going forward.  Patient felt that she was recovering well and her expressive language functions clearly have improved postsurgically.  She continues to have significant motor deficits.  Disposition/Plan:  Today we worked on coping and adjustment issues and assessed clinical observations right expressive language functions, mental status and awareness.  The patient has good mental status and orientation although some decision-making and executive functioning does appear to still be impacted although she is showing improvements in her cognitive functioning postsurgery.         Electronically Signed   _______________________ Ilean Skill, Psy.D. Clinical Neuropsychologist

## 2021-05-11 NOTE — Progress Notes (Signed)
Physical Therapy Session Note  Patient Details  Name: Angela Carpenter MRN: [id number] Date of Birth: 1940-11-25  Today's Date: 05/11/2021 PT Individual Time: 1032-1117 PT Individual Time Calculation (min): 45 min   Short Term Goals: Week 1:  PT Short Term Goal 1 (Week 1): Pt will perform squat pivot transfer to/from wc to L side w/mod assist of 1 PT Short Term Goal 2 (Week 1): pt will propel wc using hemi technique x 152f w/additional time and supervision PT Short Term Goal 3 (Week 1): pt will stand statically w/LUE support and min assist x 2 min LRAD/use of RLE blocking/bracing as needed PT Short Term Goal 4 (Week 1): supine to side to sit w/cga only  Skilled Therapeutic Interventions/Progress Updates: Pt presents sitting in w/c and agreeable to therapy.  Pt wheeled to Dayroom for time conservation.  Pt educated on initiation of sit to stand including scooting forward and maintaining flexed position, w/ visual cues given.  Pt required max to mod A for transfers to RW and then total A for placement of R hand in ortho assistive device.  Ace wrap donned for increased DF.  Pt required max A to maintain R knee extension for WB as well as advancing RLE.  Pt amb forward and back x 3 trials w/ max A.  Pt performed multiple sit to stand trials and then standing w/ extended knees up to 2', as well as weight-shifting side to side w/ manual cueing to R quads.  Pt returned to room and remained sitting in w/c w/ chair alarm on and all needs in reach.     Therapy Documentation Precautions:  Precautions Precautions: Fall Precaution Comments: R hemiparesis Required Braces or Orthoses: Sling Restrictions Weight Bearing Restrictions: Yes RLE Weight Bearing: Weight bearing as tolerated General:   Vital Signs:  Pain:0/10 Pain Assessment Pain Scale: 0-10 Pain Score: 0-No pain Mobility:       Therapy/Group: Individual Therapy  JLadoris Gene8/02/2021, 11:18 AM

## 2021-05-11 NOTE — Progress Notes (Signed)
PROGRESS NOTE   Subjective/Complaints: Up early in bathroom with OT. No new problems overnight.   ROS: Patient denies fever, rash, sore throat, blurred vision, nausea, vomiting, diarrhea, cough, shortness of breath or chest pain, joint or back pain, headache, or mood change.    Objective:   No results found. No results for input(s): WBC, HGB, HCT, PLT in the last 72 hours.  No results for input(s): NA, K, CL, CO2, GLUCOSE, BUN, CREATININE, CALCIUM in the last 72 hours.   Intake/Output Summary (Last 24 hours) at 05/11/2021 0949 Last data filed at 05/11/2021 0743 Gross per 24 hour  Intake 570 ml  Output 78 ml  Net 492 ml        Physical Exam: Vital Signs Blood pressure (!) 100/50, pulse 67, temperature 97.6 F (36.4 C), temperature source Oral, resp. rate 18, height '5\' 5"'$  (1.651 m), weight 70.3 kg, SpO2 99 %. Constitutional: No distress . Vital signs reviewed. HEENT: NCAT, EOMI, oral membranes moist Neck: supple Cardiovascular: RRR without murmur. No JVD    Respiratory/Chest: CTA Bilaterally without wheezes or rales. Normal effort    GI/Abdomen: BS +, non-tender, non-distended Ext: no clubbing, cyanosis, or edema Psych: pleasant and cooperative Skin: crani incision CDI with scab   Neuro: Alert and oriented x 3. Normal insight and awareness. Intact Memory. Mild right central 7. No focal visual deficits. Speech somewhat fluent but has word finding deficits. RUE: tr Pecs, 1+ EE, 1+/5 finger flexors, trace wrist. Similar today.  RLE 0/5 prox to distal. LUE and LLE 4/5. Pt with good sitting balance. Slight decr LT RUE/RLE.  DTR's absent Musculoskeletal: Full ROM, No pain with PROM in the neck, trunk, or extremities. Posture appropriate.     Assessment/Plan: 1. Functional deficits which require 3+ hours per day of interdisciplinary therapy in a comprehensive inpatient rehab setting. Physiatrist is providing close team  supervision and 24 hour management of active medical problems listed below. Physiatrist and rehab team continue to assess barriers to discharge/monitor patient progress toward functional and medical goals  Care Tool:  Bathing    Body parts bathed by patient: Right arm, Chest, Abdomen, Right upper leg, Left upper leg, Face, Front perineal area, Buttocks, Right lower leg, Left lower leg   Body parts bathed by helper: Left arm, Buttocks, Front perineal area, Right lower leg, Left lower leg Body parts n/a: Front perineal area   Bathing assist Assist Level: Minimal Assistance - Patient > 75%     Upper Body Dressing/Undressing Upper body dressing   What is the patient wearing?: Hospital gown only Orthosis activity level: Performed by helper  Upper body assist Assist Level: Moderate Assistance - Patient 50 - 74%    Lower Body Dressing/Undressing Lower body dressing      What is the patient wearing?: Incontinence brief     Lower body assist Assist for lower body dressing: Maximal Assistance - Patient 25 - 49%     Toileting Toileting    Toileting assist Assist for toileting: 2 Helpers     Transfers Chair/bed transfer  Transfers assist     Chair/bed transfer assist level: Moderate Assistance - Patient 50 - 74% Chair/bed transfer assistive device: Environmental consultant  Locomotion Ambulation   Ambulation assist   Ambulation activity did not occur: Safety/medical concerns  Assist level: Moderate Assistance - Patient 50 - 74% Assistive device: Walker-rolling Max distance: 37 ft   Walk 10 feet activity   Assist  Walk 10 feet activity did not occur: Safety/medical concerns  Assist level: Moderate Assistance - Patient - 50 - 74% Assistive device: Walker-rolling   Walk 50 feet activity   Assist Walk 50 feet with 2 turns activity did not occur: Safety/medical concerns  Assist level: Dependent - Patient 0% Assistive device: Lite Gait    Walk 150 feet activity   Assist  Walk 150 feet activity did not occur: Safety/medical concerns  Assist level: Dependent - Patient 0% Assistive device: Lite Gait    Walk 10 feet on uneven surface  activity   Assist Walk 10 feet on uneven surfaces activity did not occur: Safety/medical concerns         Wheelchair     Assist Will patient use wheelchair at discharge?: Yes Type of Wheelchair: Manual    Wheelchair assist level: Supervision/Verbal cueing Max wheelchair distance: 100 ft    Wheelchair 50 feet with 2 turns activity    Assist        Assist Level: Supervision/Verbal cueing   Wheelchair 150 feet activity     Assist      Assist Level: Maximal Assistance - Patient 25 - 49%   Blood pressure (!) 100/50, pulse 67, temperature 97.6 F (36.4 C), temperature source Oral, resp. rate 18, height '5\' 5"'$  (1.651 m), weight 70.3 kg, SpO2 99 %.  Medical Problem List and Plan: 1.  R hemiplegia secondary to Metastatic disease from melanoma s/p L frontal crani 05/01/21             -patient may  shower             -ELOS/Goals: 2-3 weeks - goals min A  -Continue CIR therapies including PT, OT, and SLP  2.  Age indeterminate DVT in right popliteal vein, and in bilateral posterior tibial and peroneal veins. Chronic DVT in right common femoral and femoral veins. Given age indeterminate DVTs and history of ICH  Dr. Mohamed/NS  recommended prophylactic Lovenox- no IVC filter- resumption of Eliquis in a week or two---will d/w NS 8/15             -antiplatelet therapy: N/A 3. Pain Management: Tylenol prn.  4. Mood: LCSW to follow for evaluation and support.              -antipsychotic agents: N/A 5. Neuropsych: This patient is capable of making decisions on her own behalf. 6. Skin/Wound Care:  Routine pressure relief measures.  7. Fluids/Electrolytes/Nutrition: encourage PO  -intake excellent   -protein supp for albumin 8. Paroxymal A fib:/HTN: Monitor BP tid--continue metoprolol daily.             --Off  Eliquis due to hemorrhage  -HR controlled 9. Glaucoma: Has been stable on timoptic--will resume.  10. Pre-diabetes: Hgb A1C-6.1. Diet liberalized to regular per patient/family request.             - monitor FBS weekly 11.  Leucocytosis: Likely due to steroid effect, DVT.              --no fevers or other signs of infection.  -21.4 8/1---still no signs of infection. Repeat Ucx negative   -f/u CBC Monday 8/8 12. Hyponatremia: Sodium up to 135  .  13. Urinary retention: now emptying without  I/O caths!.  -continue urecholine at 50 mg tid  - flomax at night bp is soft but holding---observe for symptomatic hypotension  -OOB to toilet/bsc to void    14. Constipation: moved bowels this w/e   - scheduled miralax bid along with senna-s--moving bowels    LOS: 8 days A FACE TO FACE EVALUATION WAS PERFORMED  [name] 05/11/2021, 9:49 AM

## 2021-05-11 NOTE — Progress Notes (Signed)
Speech Language Pathology Daily Session Note  Patient Details  Name: Angela Carpenter MRN: [id number] Date of Birth: 06-10-1941  Today's Date: 05/11/2021 SLP Individual Time: 0910-0955 SLP Individual Time Calculation (min): 45 min  Short Term Goals: Week 2: SLP Short Term Goal 1 (Week 2): Pt will complete medication and financial management for complex problem solving with min verbal cueing. SLP Short Term Goal 2 (Week 2): Patient will demonstrate alternating attention between 2 functional tasks for 30 minutes with Min verbal cues for redirection. SLP Short Term Goal 3 (Week 2): Pt will demonstrate use of word finding strategies to decrease anomia in structured language tasks given min verbal cueing. SLP Short Term Goal 4 (Week 2): Patient will follow multi-step commands with Min verbal cues.  Skilled Therapeutic Interventions: Skilled treatment session focused on cognitive goals. SLP facilitated session by providing extra time and Min verbal and visual cues for navigation of her ipad which is fairly new to the patient. Due to not being able to utilize her RUE and reporting difficulty with touch screens with her LUE, SLP gave patient a stylus.  Patient demonstrated increased ease of use with less frustration noted. Patient participated in a complex and structured language task with extra time but was overall Mod I for reading and word-finding. Patient left upright in wheelchair with alarm on and all needs within reach. Continue with current plan of care.       Pain Pain Assessment Pain Scale: 0-10 Pain Score: 0-No pain  Therapy/Group: Individual Therapy  Angela Carpenter 05/11/2021, 12:30 PM

## 2021-05-11 NOTE — Telephone Encounter (Signed)
Pts son (emergency contact) called requesting to speak with Dr. Mickeal Skinner regarding his mothers condition. He states Dr. Mickeal Skinner has been seeing her while inpt.  I advised Mr. Onorato I will send a message to dr. Mickeal Skinner for follow. He expressed understanding of this information.

## 2021-05-11 NOTE — Plan of Care (Signed)
  Problem: Consults Goal: RH BRAIN INJURY PATIENT EDUCATION Description: Description: See Patient Education module for eduction specifics Outcome: Progressing Goal: Skin Care Protocol Initiated - if Braden Score 18 or less Description: If consults are not indicated, leave blank or document N/A Outcome: Progressing   Problem: RH BLADDER ELIMINATION Goal: RH STG MANAGE BLADDER WITH ASSISTANCE Description: STG Manage Bladder With Min Assistance Outcome: Progressing   Problem: RH SAFETY Goal: RH STG ADHERE TO SAFETY PRECAUTIONS W/ASSISTANCE/DEVICE Description: STG Adhere to Safety Precautions With Min Assistance/Device. Outcome: Progressing Goal: RH STG DECREASED RISK OF FALL WITH ASSISTANCE Description: STG Decreased Risk of Fall With Cues and Reminders. Outcome: Progressing   Problem: RH COGNITION-NURSING Goal: RH STG USES MEMORY AIDS/STRATEGIES W/ASSIST TO PROBLEM SOLVE Description: STG Uses Memory Aids/Strategies With Cues and Reminders to Problem Solve. Outcome: Progressing   Problem: RH PAIN MANAGEMENT Goal: RH STG PAIN MANAGED AT OR BELOW PT'S PAIN GOAL Description: < 3 on a 0-10 pain scale. Outcome: Progressing

## 2021-05-12 NOTE — Progress Notes (Signed)
Speech Language Pathology Daily Session Note  Patient Details  Name: Angela Carpenter MRN: [id number] Date of Birth: 1940/12/13  Today's Date: 05/12/2021 SLP Individual Time: [id number] SLP Individual Time Calculation (min): 59 min  Short Term Goals: Week 2: SLP Short Term Goal 1 (Week 2): Pt will complete medication and financial management for complex problem solving with min verbal cueing. SLP Short Term Goal 2 (Week 2): Patient will demonstrate alternating attention between 2 functional tasks for 30 minutes with Min verbal cues for redirection. SLP Short Term Goal 3 (Week 2): Pt will demonstrate use of word finding strategies to decrease anomia in structured language tasks given min verbal cueing. SLP Short Term Goal 4 (Week 2): Patient will follow multi-step commands with Min verbal cues.  Skilled Therapeutic Interventions:  Pt seen for skilled ST with focus on cognitive goals. SLP facilitated session by providing Supervision verbal cues for navigation of iPad, pt able to toggle between apps and Siri with no significant difficulty. Within apps, pt benefits from min A to find and change preferred settings. Pt demonstrates no significant word finding this date during complex conversation, states her only noted remaining impairment is confusing the sides of her body and requiring extra time to recall and explain medical timelines (when she had breast CA, when she had CVA, etc). Pt able to follow multi-step commands during tasks of choice with min verbal cues this date. Pt left in wheelchair with alarm set and all needs within reach. Cont ST POC.   Pain Pain Assessment Pain Scale: 0-10 Pain Score: 0-No pain  Therapy/Group: Individual Therapy  Dewaine Conger 05/12/2021, 11:13 AM

## 2021-05-12 NOTE — Progress Notes (Signed)
Speech Language Pathology Daily Session Note  Patient Details  Name: Angela Carpenter MRN: [id number] Date of Birth: 02/06/1941  Today's Date: 05/12/2021 SLP Individual Time: 1434-1500 SLP Individual Time Calculation (min): 26 min  Short Term Goals: Week 2: SLP Short Term Goal 1 (Week 2): Pt will complete medication and financial management for complex problem solving with min verbal cueing. SLP Short Term Goal 2 (Week 2): Patient will demonstrate alternating attention between 2 functional tasks for 30 minutes with Min verbal cues for redirection. SLP Short Term Goal 3 (Week 2): Pt will demonstrate use of word finding strategies to decrease anomia in structured language tasks given min verbal cueing. SLP Short Term Goal 4 (Week 2): Patient will follow multi-step commands with Min verbal cues.  Skilled Therapeutic Interventions: Patient agreeable to skilled ST intervention with focus on cognitive goals. Assisted patient in bathroom at start of session using steady and assist from NT. Patient was able to follow 2-step commands effectively with adequate safety awareness and anticipatory problem solving by requesting SLP to organize her belongings in a way would be most accessible and easy to access later this evening. SLP further facilitated session by providing sup A verbal cues for uses of compensatory memory strategies. She reported most her effective memory tools at this time include her ipad, apple watch, and phone in which she is continuing to gain independence in and addressing in [name] treatment. Patient was left in bed with alarm activated and immediate needs within reach at end of session. Continue per current plan of care.      Pain Pain Assessment Pain Scale: 0-10 Pain Score: 0-No pain  Therapy/Group: Individual Therapy  Angela Carpenter 05/12/2021, 3:53 PM

## 2021-05-12 NOTE — Progress Notes (Signed)
PROGRESS NOTE   Subjective/Complaints:  Patient without new complaints today.  Able to sleep fairly well last night.  Is frustrated by the fact that her bladder required catheterization again.  ROS: Patient denies fever, rash, sore throat, blurred vision, nausea, vomiting, diarrhea, cough, shortness of breath or chest pain, joint or back pain, headache, or mood change.    Objective:   No results found. No results for input(s): WBC, HGB, HCT, PLT in the last 72 hours.  No results for input(s): NA, K, CL, CO2, GLUCOSE, BUN, CREATININE, CALCIUM in the last 72 hours.   Intake/Output Summary (Last 24 hours) at 05/12/2021 1050 Last data filed at 05/12/2021 0921 Gross per 24 hour  Intake 360 ml  Output 850 ml  Net -490 ml        Physical Exam: Vital Signs Blood pressure (!) 102/53, pulse 68, temperature 97.8 F (36.6 C), temperature source Oral, resp. rate 18, height '5\' 5"'$  (1.651 m), weight 70.3 kg, SpO2 99 %. Constitutional: No distress . Vital signs reviewed. HEENT: NCAT, EOMI, oral membranes moist Neck: supple Cardiovascular: RRR without murmur. No JVD    Respiratory/Chest: CTA Bilaterally without wheezes or rales. Normal effort    GI/Abdomen: BS +, non-tender, non-distended Ext: no clubbing, cyanosis, or edema Psych: pleasant and cooperative  Skin: crani incision CDI with scab.  Still a little bit too much scabs removed staples Neuro: Alert and oriented x 3. Normal insight and awareness. Intact Memory. Mild right central 7. No focal visual deficits. Speech somewhat fluent but has word finding deficits. RUE: tr Pecs, 1+ EE, 1+/5 finger flexors, trace wrist. Similar today.  RLE 0/5 prox to distal. LUE and LLE 4/5. Pt with good sitting balance. Slight decr LT RUE/RLE.  DTR's absent Musculoskeletal: Full ROM, No pain with PROM in the neck, trunk, or extremities. Posture appropriate.     Assessment/Plan: 1. Functional deficits  which require 3+ hours per day of interdisciplinary therapy in a comprehensive inpatient rehab setting. Physiatrist is providing close team supervision and 24 hour management of active medical problems listed below. Physiatrist and rehab team continue to assess barriers to discharge/monitor patient progress toward functional and medical goals  Care Tool:  Bathing    Body parts bathed by patient: Right arm, Chest, Abdomen, Right upper leg, Left upper leg, Face, Front perineal area, Buttocks, Right lower leg, Left lower leg   Body parts bathed by helper: Left arm, Buttocks, Front perineal area, Right lower leg, Left lower leg Body parts n/a: Front perineal area   Bathing assist Assist Level: Minimal Assistance - Patient > 75%     Upper Body Dressing/Undressing Upper body dressing   What is the patient wearing?: Hospital gown only Orthosis activity level: Performed by helper  Upper body assist Assist Level: Moderate Assistance - Patient 50 - 74%    Lower Body Dressing/Undressing Lower body dressing      What is the patient wearing?: Incontinence brief     Lower body assist Assist for lower body dressing: Maximal Assistance - Patient 25 - 49%     Toileting Toileting    Toileting assist Assist for toileting: 2 Helpers     Transfers Chair/bed transfer  Transfers assist     Chair/bed transfer assist level: Moderate Assistance - Patient 50 - 74% Chair/bed transfer assistive device: Programmer, multimedia   Ambulation assist   Ambulation activity did not occur: Safety/medical concerns  Assist level: Moderate Assistance - Patient 50 - 74% Assistive device: Walker-rolling Max distance: 37 ft   Walk 10 feet activity   Assist  Walk 10 feet activity did not occur: Safety/medical concerns  Assist level: Moderate Assistance - Patient - 50 - 74% Assistive device: Walker-rolling   Walk 50 feet activity   Assist Walk 50 feet with 2 turns activity did not  occur: Safety/medical concerns  Assist level: Dependent - Patient 0% Assistive device: Lite Gait    Walk 150 feet activity   Assist Walk 150 feet activity did not occur: Safety/medical concerns  Assist level: Dependent - Patient 0% Assistive device: Lite Gait    Walk 10 feet on uneven surface  activity   Assist Walk 10 feet on uneven surfaces activity did not occur: Safety/medical concerns         Wheelchair     Assist Will patient use wheelchair at discharge?: Yes Type of Wheelchair: Manual    Wheelchair assist level: Supervision/Verbal cueing Max wheelchair distance: 100 ft    Wheelchair 50 feet with 2 turns activity    Assist        Assist Level: Supervision/Verbal cueing   Wheelchair 150 feet activity     Assist      Assist Level: Maximal Assistance - Patient 25 - 49%   Blood pressure (!) 102/53, pulse 68, temperature 97.8 F (36.6 C), temperature source Oral, resp. rate 18, height '5\' 5"'$  (1.651 m), weight 70.3 kg, SpO2 99 %.  Medical Problem List and Plan: 1.  R hemiplegia secondary to Metastatic disease from melanoma s/p L frontal crani 05/01/21             -patient may  shower             -ELOS/Goals: 2-3 weeks - goals min A  -Continue CIR therapies including PT, OT, and SLP   2.  Age indeterminate DVT in right popliteal vein, and in bilateral posterior tibial and peroneal veins. Chronic DVT in right common femoral and femoral veins. Given age indeterminate DVTs and history of ICH  Dr. Mohamed/NS  recommended prophylactic Lovenox- no IVC filter- resumption of Eliquis in a week or two---will d/w NS 8/15             -antiplatelet therapy: N/A 3. Pain Management: Tylenol prn.  4. Mood: LCSW to follow for evaluation and support.              -antipsychotic agents: N/A 5. Neuropsych: This patient is capable of making decisions on her own behalf. 6. Skin/Wound Care:  Routine pressure relief measures.  7. Fluids/Electrolytes/Nutrition:  encourage PO  -intake excellent   -protein supp for albumin 8. Paroxymal A fib:/HTN: Monitor BP tid--continue metoprolol daily.             --Off Eliquis due to hemorrhage  -HR controlled 9. Glaucoma: Has been stable on timoptic--will resume.  10. Pre-diabetes: Hgb A1C-6.1. Diet liberalized to regular per patient/family request.             - monitor FBS weekly 11.  Leucocytosis: Likely due to steroid effect, DVT.              --no fevers or other signs of infection.  -21.4 8/1---still no signs of  infection. Repeat Ucx negative   -f/u CBC Monday 8/8 12. Hyponatremia: Sodium up to 135  .  13. Urinary retention: Required 1 in and out cath last night.  -continue urecholine at 50 mg tid  - flomax at night bp is soft but holding---observe for symptomatic hypotension  -OOB to toilet/bsc to void  -Discussed with patient and encouraged her not to be discouraged by the catheterization.  I would expect further improvement in consistency as we move along    14. Constipation: moved bowels this w/e   - scheduled miralax bid along with senna-s--moving bowels    LOS: 9 days A FACE TO [place] 05/12/2021, 10:50 AM

## 2021-05-13 NOTE — Progress Notes (Signed)
Speech Language Pathology Daily Session Note  Patient Details  Name: Angela Carpenter MRN: [id number] Date of Birth: 1941/03/20  Today's Date: 05/13/2021 SLP Individual Time: 0850-0930 SLP Individual Time Calculation (min): 40 min  Short Term Goals: Week 2: SLP Short Term Goal 1 (Week 2): Pt will complete medication and financial management for complex problem solving with min verbal cueing. SLP Short Term Goal 2 (Week 2): Patient will demonstrate alternating attention between 2 functional tasks for 30 minutes with Min verbal cues for redirection. SLP Short Term Goal 3 (Week 2): Pt will demonstrate use of word finding strategies to decrease anomia in structured language tasks given min verbal cueing. SLP Short Term Goal 4 (Week 2): Patient will follow multi-step commands with Min verbal cues.  Skilled Therapeutic Interventions:  Pt was seen for skilled ST targeting word finding goals.  Pt was seated up in wheelchair upon arrival, awake,alert, and agreeable to participating in treatment.  Pt's affect overall appears much brighter and her speech was more fluent in comparison to last treatment session with this therapist.  She asked therapist to move her iPad to her table tray in front of her instead of on her night stand behind her as she has been using it to "pass the time" in between therapy sessions.  Pt reports that accessing things on the iPad has gotten easier with each use.   During functional conversations with therapist, pt had sporadic instances of anomia or word substitutions which she identified and corrected independently.  She reports that she feels her language and cognition are near baseline.  Pt was left in wheelchair with chair alarm set and call bell within reach.  Continue per current plan of care.    Pain Pain Assessment Pain Scale: 0-10 Pain Score: 0-No pain  Therapy/Group: Individual Therapy  Angela Carpenter, Selinda Orion 05/13/2021, 12:22 PM

## 2021-05-13 NOTE — Progress Notes (Signed)
Pt was offered to try to use the bathroom before doing in/out cath, but pt refused. Pt was educated, demonstrates understanding.

## 2021-05-13 NOTE — Progress Notes (Signed)
PROGRESS NOTE   Subjective/Complaints: Still frustrated that her bladder won't empty consistently. Denies pain. Resting well  ROS: Patient denies fever, rash, sore throat, blurred vision, nausea, vomiting, diarrhea, cough, shortness of breath or chest pain, joint or back pain, headache, or mood change.    Objective:   No results found. No results for input(s): WBC, HGB, HCT, PLT in the last 72 hours.  No results for input(s): NA, K, CL, CO2, GLUCOSE, BUN, CREATININE, CALCIUM in the last 72 hours.   Intake/Output Summary (Last 24 hours) at 05/13/2021 0917 Last data filed at 05/13/2021 0748 Gross per 24 hour  Intake 840 ml  Output 1450 ml  Net -610 ml        Physical Exam: Vital Signs Blood pressure (!) 104/55, pulse 74, temperature (!) 97.5 F (36.4 C), temperature source Oral, resp. rate 18, height '5\' 5"'$  (1.651 m), weight 70.3 kg, SpO2 98 %. Constitutional: No distress . Vital signs reviewed. HEENT: NCAT, EOMI, oral membranes moist Neck: supple Cardiovascular: RRR without murmur. No JVD    Respiratory/Chest: CTA Bilaterally without wheezes or rales. Normal effort    GI/Abdomen: BS +, non-tender, non-distended Ext: no clubbing, cyanosis, or edema Psych: pleasant and cooperative  Skin: crani incision CDI with scabs, staples. Was able to remove a lot of scab today Neuro: Alert and oriented x 3. Normal insight and awareness. Intact Memory. Mild right central 7. No focal visual deficits. Speech somewhat fluent but has word finding deficits. RUE: tr Pecs, 1+ EE, 1+/5 finger flexors, trace wrist. Similar today.  RLE 0/5 prox to distal. LUE and LLE 4/5.---exam stable. Slight decr LT RUE/RLE.  DTR's absent Musculoskeletal: Full ROM, No pain with PROM in the neck, trunk, or extremities. Posture appropriate.     Assessment/Plan: 1. Functional deficits which require 3+ hours per day of interdisciplinary therapy in a comprehensive  inpatient rehab setting. Physiatrist is providing close team supervision and 24 hour management of active medical problems listed below. Physiatrist and rehab team continue to assess barriers to discharge/monitor patient progress toward functional and medical goals  Care Tool:  Bathing    Body parts bathed by patient: Right arm, Chest, Abdomen, Right upper leg, Left upper leg, Face, Front perineal area, Buttocks, Right lower leg, Left lower leg   Body parts bathed by helper: Left arm, Buttocks, Front perineal area, Right lower leg, Left lower leg Body parts n/a: Front perineal area   Bathing assist Assist Level: Minimal Assistance - Patient > 75%     Upper Body Dressing/Undressing Upper body dressing   What is the patient wearing?: Hospital gown only Orthosis activity level: Performed by helper  Upper body assist Assist Level: Moderate Assistance - Patient 50 - 74%    Lower Body Dressing/Undressing Lower body dressing      What is the patient wearing?: Incontinence brief     Lower body assist Assist for lower body dressing: Maximal Assistance - Patient 25 - 49%     Toileting Toileting    Toileting assist Assist for toileting: 2 Helpers     Transfers Chair/bed transfer  Transfers assist     Chair/bed transfer assist level: Moderate Assistance - Patient 50 -  74% Chair/bed transfer assistive device: Museum/gallery exhibitions officer assist   Ambulation activity did not occur: Safety/medical concerns  Assist level: Moderate Assistance - Patient 50 - 74% Assistive device: Walker-rolling Max distance: 37 ft   Walk 10 feet activity   Assist  Walk 10 feet activity did not occur: Safety/medical concerns  Assist level: Moderate Assistance - Patient - 50 - 74% Assistive device: Walker-rolling   Walk 50 feet activity   Assist Walk 50 feet with 2 turns activity did not occur: Safety/medical concerns  Assist level: Dependent - Patient 0% Assistive  device: Lite Gait    Walk 150 feet activity   Assist Walk 150 feet activity did not occur: Safety/medical concerns  Assist level: Dependent - Patient 0% Assistive device: Lite Gait    Walk 10 feet on uneven surface  activity   Assist Walk 10 feet on uneven surfaces activity did not occur: Safety/medical concerns         Wheelchair     Assist Will patient use wheelchair at discharge?: Yes Type of Wheelchair: Manual    Wheelchair assist level: Supervision/Verbal cueing Max wheelchair distance: 100 ft    Wheelchair 50 feet with 2 turns activity    Assist        Assist Level: Supervision/Verbal cueing   Wheelchair 150 feet activity     Assist      Assist Level: Maximal Assistance - Patient 25 - 49%   Blood pressure (!) 104/55, pulse 74, temperature (!) 97.5 F (36.4 C), temperature source Oral, resp. rate 18, height '5\' 5"'$  (1.651 m), weight 70.3 kg, SpO2 98 %.  Medical Problem List and Plan: 1.  R hemiplegia secondary to Metastatic disease from melanoma s/p L frontal crani 05/01/21             -patient may  shower             -ELOS/Goals: 2-3 weeks - goals min A  -Continue CIR therapies including PT, OT, and SLP   2.  Age indeterminate DVT in right popliteal vein, and in bilateral posterior tibial and peroneal veins. Chronic DVT in right common femoral and femoral veins. Given age indeterminate DVTs and history of ICH  Dr. Mohamed/NS  recommended prophylactic Lovenox- no IVC filter- resumption of Eliquis in a week or two---will d/w NS wk of 8/15             -antiplatelet therapy: N/A 3. Pain Management: Tylenol prn.  4. Mood: LCSW to follow for evaluation and support.              -antipsychotic agents: N/A 5. Neuropsych: This patient is capable of making decisions on her own behalf. 6. Skin/Wound Care:  remove staples today 8/7 7. Fluids/Electrolytes/Nutrition: encourage PO  -intake excellent   -protein supp for albumin 8. Paroxymal A fib:/HTN:  Monitor BP tid--continue metoprolol daily.             --Off Eliquis due to hemorrhage  -HR still controlled 9. Glaucoma: Has been stable on timoptic--will resume.  10. Pre-diabetes: Hgb A1C-6.1. Diet liberalized to regular per patient/family request.             - monitor FBS weekly 11.  Leucocytosis: Likely due to steroid effect, DVT.              --no fevers or other signs of infection.  -21.4 8/1---still no signs of infection. Repeat Ucx negative   -f/u CBC Monday 8/8 12. Hyponatremia: Sodium  up to 135  .  13. Urinary retention: Required 2 I/o caths yesterday. Otherwise she emptied  -continue urecholine at 50 mg tid  -continue flomax at night. bp is soft but holding  -OOB to toilet/bsc to void  -I am still very optimistic about her bladder. I asked her to stay with the program    14. Constipation: moved bowels this w/e   - scheduled miralax bid along with senna-s--moving bowels    LOS: 10 days A FACE TO FACE EVALUATION WAS PERFORMED  [name] 05/13/2021, 9:17 AM

## 2021-05-13 NOTE — Progress Notes (Signed)
Physical Therapy Weekly Progress Note  Patient Details  Name: Angela Carpenter MRN: [id number] Date of Birth: 01/08/1941  Beginning of progress report period: May 04, 2021 End of progress report period: May 13, 2021  Today's Date: 05/13/2021 PT Individual Time: 1350-1500 PT Individual Time Calculation (min): 70 min   Patient has met 2 of 4 short term goals.  Patient with slow, but steady progress limited by minimal motor return in R hemi-body. Patient currently requires min A for bed mobility and slide-board transfers, min-mod A for lateral scoot, squat pivot , and sit-to-stand transfers, mod A for gait up to 40 feet using RW and R AFO, and 100 feet, limited by fatigue, w/c propulsion using hemi-technique with supervision.   Patient continues to demonstrate the following deficits muscle weakness, decreased cardiorespiratoy endurance, abnormal tone, unbalanced muscle activation, decreased coordination, and decreased motor planning, and decreased sitting balance, decreased standing balance, decreased postural control, hemiplegia, and decreased balance strategies and therefore will continue to benefit from skilled PT intervention to increase functional independence with mobility.  Patient progressing toward long term goals.  Continue plan of care.  PT Short Term Goals Week 1:  PT Short Term Goal 1 (Week 1): Pt will perform squat pivot transfer to/from wc to L side w/mod assist of 1 PT Short Term Goal 1 - Progress (Week 1): Met PT Short Term Goal 2 (Week 1): pt will propel wc using hemi technique x 162f w/additional time and supervision PT Short Term Goal 2 - Progress (Week 1): Progressing toward goal PT Short Term Goal 3 (Week 1): pt will stand statically w/LUE support and min assist x 2 min LRAD/use of RLE blocking/bracing as needed PT Short Term Goal 3 - Progress (Week 1): Met PT Short Term Goal 4 (Week 1): supine to side to sit w/cga only PT Short Term Goal 4 - Progress (Week 1):  Progressing toward goal Week 2:  PT Short Term Goal 1 (Week 2): supine to side to sit w/cga only PT Short Term Goal 2 (Week 2): pt will propel wc using hemi technique x 1528fw/additional time and supervision PT Short Term Goal 3 (Week 2): Patient will perform basic transfers with min A consistently. PT Short Term Goal 4 (Week 2): Patient will ambulate >75 feet with mod A using LRAD.  Skilled Therapeutic Interventions/Progress Updates:     Patient in w/c with her sister-in-law in the room upon PT arrival. Patient alert and agreeable to PT session. Patient denied pain during session. Noted increased B lower extremity edema, donned B TED hose piror to donning shoes with total A at beginning of session.   Patient's sister-in-law reported feeling concerns about d/c date and about patient's slow progress. Reports that she is most concerned about getting the patient in/out of the care and asked about where she could go if the patient isn't ready to go home by the d/c date. Educated patient and her sister-in-law on min A goals and transfer types. Agreed to trial car transfer this session. Educated on SNF versus HHPT and goals for HHPT at d/c at this time. Reinforced that team conference would be held on Tuesday and the patient will be informed about updates and to inform Angela Carpenter, [place] she would like family contacted about updates. Patient and her sister-in-law stated understanding. Also, reports ramp is being installed today at home.   Therapeutic Activity: Transfers: Patient performed sit to/from stand x3 with mod A. Provided verbal cues for scooting forward with min A for  brining R hip forward, hand placement, and forward weight shift. Pulled up pants in standing x1 and focused on R hip/knee activation in standing >2 min during functional task. Patient performed lateral scoot transfer w/c<>mat table with min A and multiple stops between scoots due to decreased hip clearance. Patient then performed slide  board transfers w/c<>mat table with min A and total A for board placement. Provided cues for hand placement, board placement, and head-hips relationship for proper technique and decreased assist with transfers.  Patient performed a simulated sedan height car transfer with min A using slide board. Provided cues for safe technique, board placement, w/c set-up, and head-hips relationship.  Gait Training:  Patient ambulated 32 feet and 40 feet using RW with R AFO with anterior strut for improved knee stability in stance and shoe cover for improved toe clearance, required min-mod A for balance and max A for R limb advancement. Noted increased R hip flexor and hamstring activation to initiate limb advancement this session.  Ambulated with decreased B weight shift, increased R knee flexion in stance with buckling x2, increased R knee valgus, forward trunk flexion, downward head gaze, and step-to with intermittent reciprocal pattern. Provided facilitation or verbal cues for sequencing, weight shifts, R hamstring activation in pre-swing, R gluteal activation in stance, and erect posture.  Wheelchair Mobility:  Patient was transported in the w/c with total A throughout session for energy conservation and time management.  Patient in w/c with her sister in the room at end of session with breaks locked, seat belt alarm set, and all needs within reach. Informed patient and her sister-in-law on patient's progress and expectations for focus on transfers and gait training with PT this week.   Therapy Documentation Precautions:  Precautions Precautions: Fall Precaution Comments: R hemiparesis Required Braces or Orthoses: Sling Restrictions Weight Bearing Restrictions: No RLE Weight Bearing: Weight bearing as tolerated   Therapy/Group: Individual Therapy  Angela Carpenter L Angela Carpenter PT, DPT  05/13/2021, 3:29 PM

## 2021-05-14 LAB — BASIC METABOLIC PANEL
Anion gap: 7 (ref 5–15)
BUN: 21 mg/dL (ref 8–23)
CO2: 27 mmol/L (ref 22–32)
Calcium: 8.8 mg/dL — ABNORMAL LOW (ref 8.9–10.3)
Chloride: 101 mmol/L (ref 98–111)
Creatinine, Ser: 0.53 mg/dL (ref 0.44–1.00)
GFR, Estimated: >60 mL/min (ref >60)
Glucose, Bld: 93 mg/dL (ref 70–99)
Potassium: 3.8 mmol/L (ref 3.5–5.1)
Sodium: 135 mmol/L (ref 135–145)

## 2021-05-14 LAB — CBC
HCT: 38.3 % (ref 36.0–46.0)
Hemoglobin: 12.9 g/dL (ref 12.0–15.0)
MCH: 32.3 pg (ref 26.0–34.0)
MCHC: 33.7 g/dL (ref 30.0–36.0)
MCV: 96 fL (ref 80.0–100.0)
Platelets: 248 10*3/uL (ref 150–400)
RBC: 3.99 MIL/uL (ref 3.87–5.11)
RDW: 14.3 % (ref 11.5–15.5)
WBC: 15.8 10*3/uL — ABNORMAL HIGH (ref 4.0–10.5)
nRBC: 0 % (ref 0.0–0.2)

## 2021-05-14 NOTE — Progress Notes (Signed)
Occupational Therapy Session Note  Patient Details  Name: Angela Carpenter MRN: [id number] Date of Birth: 1941-03-26  Today's Date: 05/14/2021 OT Individual Time: 5997-7414 OT Individual Time Calculation (min): 57 min    Short Term Goals: Week 2:  OT Short Term Goal 1 (Week 2): Pt will complete UB dressing with MIN A OT Short Term Goal 2 (Week 2): Pt will thread BLE into pants with A for LE positioning only PRN OT Short Term Goal 3 (Week 2): Pt will transfer consistently to toilet with MOD A and LRAD OT Short Term Goal 4 (Week 2): Pt will recall adapative bathing strategies with MIN cuing and supervision   Skilled Therapeutic Interventions/Progress Updates:    Pt sitting up in the w/c with no c/o pain, agreeable to OT session with focus on shower level ADLs and integration of RUE with forced use. Pt with great return in the RUE- now with active elbow flexion/extension, full grasp (weak), and some (2/5) anterior deltoid activation. Ambulatory transfer into the bathroom with the RW- no orthosis for RUE and pt able to maintain grasp. CGA-min A for standing balance but mod-max A facilitation for RLE advancement. Increased time provided for pt to initiate hip flexion during ambulation. Mod A for LB bathing/dressing. Pt required frequent cueing for RUE use, often using LUE to provide total A without attempting movement. She was able to grasp washcloth and wash her LUE with only min cueing for initiation, with her LUE removing some weight of gravity. UB dressing with min A. Mod A sit <> stand for LB dressing. Pt was left sitting up in the w/c with all needs met. Chair alarm belt fastened.   Saebo Stim One applied for unattended e-stim to the R shoulder for joint approximation and muscle activation. No c/o pain before or after tx and no adverse skin reactions.  330 pulse width 35 Hz pulse rate On 8 sec/ off 8 sec Ramp up/ down 2 sec Symmetrical Biphasic wave form  Max intensity 126m at 500 Ohm  load   Therapy Documentation Precautions:  Precautions Precautions: Fall Precaution Comments: R hemiparesis Required Braces or Orthoses: Sling Restrictions Weight Bearing Restrictions: No RLE Weight Bearing: Weight bearing as tolerated  Therapy/Group: Individual Therapy  SCurtis Sites8/05/2021, 6:13 AM

## 2021-05-14 NOTE — Progress Notes (Signed)
PROGRESS NOTE   Subjective/Complaints: Voided yesterday pm, also with BM  ROS: Patient denies CP, SOB, N/V/D Objective:   No results found. Recent Labs    05/14/21 0553  WBC 15.8*  HGB 12.9  HCT 38.3  PLT 248    Recent Labs    05/14/21 0553  NA 135  K 3.8  CL 101  CO2 27  GLUCOSE 93  BUN 21  CREATININE 0.53  CALCIUM 8.8*     Intake/Output Summary (Last 24 hours) at 05/14/2021 1158 Last data filed at 05/14/2021 0845 Gross per 24 hour  Intake 720 ml  Output --  Net 720 ml         Physical Exam: Vital Signs Blood pressure (!) 105/55, pulse 70, temperature 98.1 F (36.7 C), temperature source Oral, resp. rate 16, height '5\' 5"'$  (1.651 m), weight 70.3 kg, SpO2 100 %.  General: No acute distress Mood and affect are appropriate Heart: Regular rate and rhythm no rubs murmurs or extra sounds Lungs: Clear to auscultation, breathing unlabored, no rales or wheezes Abdomen: Positive bowel sounds, soft nontender to palpation, nondistended Extremities: No clubbing, cyanosis, or edema Skin: No evidence of breakdown, no evidence of rash  visual deficits. Speech somewhat fluent but has word finding deficits. RUE: tr Pecs, 1+ EE, 1+/5 finger flexors, trace wrist.   RLE 0/5 except trace toe flexion, inconsistent  LUE and LLE 4/5.---exam stable. Slight decr LT RUE/RLE.  DTR's absent Musculoskeletal: Full ROM, No pain with PROM in the neck, trunk, or extremities. Posture appropriate.     Assessment/Plan: 1. Functional deficits which require 3+ hours per day of interdisciplinary therapy in a comprehensive inpatient rehab setting. Physiatrist is providing close team supervision and 24 hour management of active medical problems listed below. Physiatrist and rehab team continue to assess barriers to discharge/monitor patient progress toward functional and medical goals  Care Tool:  Bathing    Body parts bathed by patient:  Right arm, Chest, Abdomen, Right upper leg, Left upper leg, Face, Front perineal area, Buttocks, Right lower leg, Left lower leg   Body parts bathed by helper: Left arm, Buttocks, Front perineal area, Right lower leg, Left lower leg Body parts n/a: Front perineal area   Bathing assist Assist Level: Minimal Assistance - Patient > 75%     Upper Body Dressing/Undressing Upper body dressing   What is the patient wearing?: Hospital gown only Orthosis activity level: Performed by helper  Upper body assist Assist Level: Moderate Assistance - Patient 50 - 74%    Lower Body Dressing/Undressing Lower body dressing      What is the patient wearing?: Incontinence brief     Lower body assist Assist for lower body dressing: Maximal Assistance - Patient 25 - 49%     Toileting Toileting    Toileting assist Assist for toileting: 2 Helpers     Transfers Chair/bed transfer  Transfers assist     Chair/bed transfer assist level: Minimal Assistance - Patient > 75% Chair/bed transfer assistive device: Sliding board   Locomotion Ambulation   Ambulation assist   Ambulation activity did not occur: Safety/medical concerns  Assist level: Moderate Assistance - Patient 50 - 74% Assistive device:  Walker-rolling Max distance: 40 ft   Walk 10 feet activity   Assist  Walk 10 feet activity did not occur: Safety/medical concerns  Assist level: Moderate Assistance - Patient - 50 - 74% Assistive device: Walker-rolling   Walk 50 feet activity   Assist Walk 50 feet with 2 turns activity did not occur: Safety/medical concerns  Assist level: Dependent - Patient 0% Assistive device: Lite Gait    Walk 150 feet activity   Assist Walk 150 feet activity did not occur: Safety/medical concerns  Assist level: Dependent - Patient 0% Assistive device: Lite Gait    Walk 10 feet on uneven surface  activity   Assist Walk 10 feet on uneven surfaces activity did not occur: Safety/medical  concerns         Wheelchair     Assist Will patient use wheelchair at discharge?: Yes Type of Wheelchair: Manual    Wheelchair assist level: Supervision/Verbal cueing Max wheelchair distance: 100 ft    Wheelchair 50 feet with 2 turns activity    Assist        Assist Level: Supervision/Verbal cueing   Wheelchair 150 feet activity     Assist      Assist Level: Maximal Assistance - Patient 25 - 49%   Blood pressure (!) 105/55, pulse 70, temperature 98.1 F (36.7 C), temperature source Oral, resp. rate 16, height '5\' 5"'$  (1.651 m), weight 70.3 kg, SpO2 100 %.  Medical Problem List and Plan: 1.  R hemiplegia secondary to Metastatic disease from melanoma s/p L frontal crani 05/01/21             -patient may  shower             -ELOS/Goals: 2-3 weeks - goals min A  -Continue CIR therapies including PT, OT, and SLP   2.  Age indeterminate DVT in right popliteal vein, and in bilateral posterior tibial and peroneal veins. Chronic DVT in right common femoral and femoral veins. Given age indeterminate DVTs and history of ICH  Dr. Mohamed/NS  recommended prophylactic Lovenox- no IVC filter- resumption of Eliquis in a week or two---will d/w NS wk of 8/15             -antiplatelet therapy: N/A 3. Pain Management: Tylenol prn.  4. Mood: LCSW to follow for evaluation and support.              -antipsychotic agents: N/A 5. Neuropsych: This patient is capable of making decisions on her own behalf. 6. Skin/Wound Care:  remove staples today 8/7 7. Fluids/Electrolytes/Nutrition: encourage PO  -intake excellent   -protein supp for albumin 8. Paroxymal A fib:/HTN: Monitor BP tid--continue metoprolol daily.             --Off Eliquis due to hemorrhage  -HR still controlled 9. Glaucoma: Has been stable on timoptic--will resume.  10. Pre-diabetes: Hgb A1C-6.1. Diet liberalized to regular per patient/family request.             - monitor FBS weekly 11.  Leucocytosis: Likely due to  steroid effect, DVT.              --no fevers or other signs of infection.  -21.4 8/1---still no signs of infection. Repeat Ucx negative   -f/u CBC Monday 8/8 12. Hyponatremia: Sodium up to 135  .  13. Urinary retention: last cath 8/7 at 0100  -continue urecholine at 50 mg tid  -continue flomax at night. bp is soft but holding  -OOB to toilet/bsc to  void      14. Constipation: moved bowels this w/e   - scheduled miralax bid along with senna-s--BM x 2 yesterday     LOS: 11 days A FACE TO FACE EVALUATION WAS PERFORMED  Charlett Blake 05/14/2021, 11:58 AM

## 2021-05-14 NOTE — Progress Notes (Signed)
Physical Therapy Session Note  Patient Details  Name: Angela Carpenter MRN: [id number] Date of Birth: 1940-12-24  Today's Date: 05/14/2021 PT Individual Time: 1352-1452 PT Individual Time Calculation (min): 60 min   Short Term Goals: Week 1:  PT Short Term Goal 1 (Week 1): Pt will perform squat pivot transfer to/from wc to L side w/mod assist of 1 PT Short Term Goal 1 - Progress (Week 1): Met PT Short Term Goal 2 (Week 1): pt will propel wc using hemi technique x 121f w/additional time and supervision PT Short Term Goal 2 - Progress (Week 1): Progressing toward goal PT Short Term Goal 3 (Week 1): pt will stand statically w/LUE support and min assist x 2 min LRAD/use of RLE blocking/bracing as needed PT Short Term Goal 3 - Progress (Week 1): Met PT Short Term Goal 4 (Week 1): supine to side to sit w/cga only PT Short Term Goal 4 - Progress (Week 1): Progressing toward goal Week 2:  PT Short Term Goal 1 (Week 2): supine to side to sit w/cga only PT Short Term Goal 2 (Week 2): pt will propel wc using hemi technique x 1546fw/additional time and supervision PT Short Term Goal 3 (Week 2): Patient will perform basic transfers with min A consistently. PT Short Term Goal 4 (Week 2): Patient will ambulate >75 feet with mod A using LRAD.   Skilled Therapeutic Interventions/Progress Updates:  Patient seated upright in w/c on entrance to room. Patient alert and agreeable to PT session. Patient denied pain during session. TED hose donned prior to start of session.   Therapeutic Activity: Transfers: Focus on squat pivot transfers this session. Verbal instruction and visual demonstration provided prior to performance with pt demonstrating understanding but requiring vc throughout for proper performance. Pt performs toward L side throughout session w/c <> mat table. Vc for leaning forward over therapist, hand placement, lateral scoot positioning prior to attempt. W/C to mat table requires 2 efforts to  complete. Pt able to lateral scoot into improved position prior to pivot back to w/c and is able to complete in one effort. ModA required throughout with improving performance by end of session.   Patient performed STS transfers with R hand grasping UE support and L hand pushing from mat table improving from Mod A to Min A for power up. CGA/ close standby supervision to R knee with no sign of buckling. Provided vc for increased effort during powerup.  Neuromuscular Re-ed: NMR facilitated during session with focus on R hip, knee, and ankle/ toe muscle activation. Pt guided in seated toe touches to 2" step. With compensatory bkwd lean, pt is able to raise foot 1-1.5" from ground. VC for adding R toe extension as pt is able to demo newly acquired voluntary toe movement this day. With added toe extension, pt increases lift of knee requiring overall Min/ Mod A to clear step. Performed 2x6 reps to pt's fatigue. Focus on lift of kne onto and off of step with knee ext or flex to complete movement. Added toe movement to assist.  During standing, pt able to grasp with R hand and hold to BUE support. Guided in fwd/ bkwd stepping with guard to R knee. Pt improves step length with blocked practice and increased confidence in ability.   Pt also guided in use of new voluntary grasp in R fingers/ hand to interlace with L hand and perform arm curls, AP extension/ flexion in and out from chest height as well as straight arm raises.  All to be preformed with conscious practice that bilateral arms are performing voluntary movements together in order to improve NM motor control to RUE. NMR performed for improvements in motor control and coordination, balance, sequencing, judgement, and self confidence/ efficacy in performing all aspects of mobility at highest level of independence.   Patient seated  in w/c at end of session with brakes locked, seat alarm set, and all needs within reach at bedside or at table placed on front of  pt.      Therapy Documentation Precautions:  Precautions Precautions: Fall Precaution Comments: R hemiparesis Required Braces or Orthoses: Sling Restrictions Weight Bearing Restrictions: No RLE Weight Bearing: Weight bearing as tolerated  Therapy/Group: Individual Therapy  Alger Simons PT, DPT 05/14/2021, 5:59 PM

## 2021-05-14 NOTE — Progress Notes (Signed)
Speech Language Pathology Daily Session Note  Patient Details  Name: Angela Carpenter MRN: [id number] Date of Birth: 1941-07-04  Today's Date: 05/14/2021 SLP Individual Time: 1300-1345 SLP Individual Time Calculation (min): 45 min  Short Term Goals: Week 2: SLP Short Term Goal 1 (Week 2): Pt will complete medication and financial management for complex problem solving with min verbal cueing. SLP Short Term Goal 2 (Week 2): Patient will demonstrate alternating attention between 2 functional tasks for 30 minutes with Min verbal cues for redirection. SLP Short Term Goal 3 (Week 2): Pt will demonstrate use of word finding strategies to decrease anomia in structured language tasks given min verbal cueing. SLP Short Term Goal 4 (Week 2): Patient will follow multi-step commands with Min verbal cues.  Skilled Therapeutic Interventions: Patient agreeable to skilled ST intervention with focus on cognitive goals. SLP facilitated session by providing overall sup A verbal cues with medication organization and problem solving task. Patient exhibited cognitive fatigue about halfway into session and was known to confuse "take one pill twice daily" during 3 occasions during task with decreased awareness. When brought to her awareness she denied having any difficulty and frequently stated "I don't have any issues with that at home." Patient was somewhat resistant to medication related tasks as she reported she has a medication system that works for her at home so there "isn't a need to talk about it here." SLP provided gentle education regarding cognitive-linguistic changes and challenges s/p craniotomy secondary to decreased awareness of deficits. Patient did acknowledge that current medications are different than the medications she was taking at prior level. Patient was left in wheelchair with alarm activated and immediate needs within reach at end of session. Continue per current plan of care.      Pain Pain  Assessment Pain Scale: 0-10 Pain Score: 0-No pain  Therapy/Group: Individual Therapy  Tayloranne Lekas T Shravya Wickwire 05/14/2021, 1:09 PM

## 2021-05-14 NOTE — Progress Notes (Signed)
Patient sitting up in chair at this time with no  complaints of pain or discomfort. Right arm elevated on a pillow due to right sided weakness.  Tolerated all meals and medications. Voided when taken to the toilet with no need for craterization. Last bladder scanned at 6pm with 81ms noted.

## 2021-05-15 ENCOUNTER — Other Ambulatory Visit: Payer: Self-pay | Admitting: Radiation Therapy

## 2021-05-15 NOTE — Progress Notes (Signed)
Occupational Therapy Session Note  Patient Details  Name: Angela Carpenter MRN: [id number] Date of Birth: 09/17/1941  Today's Date: 05/15/2021 OT Individual Time: 1030-1130 OT Individual Time Calculation (min): 60 min   Session 2: OT Individual Time: 1330-1400 OT Individual Time Calculation (min): 30 min    Short Term Goals: Week 2:  OT Short Term Goal 1 (Week 2): Pt will complete UB dressing with MIN A OT Short Term Goal 2 (Week 2): Pt will thread BLE into pants with A for LE positioning only PRN OT Short Term Goal 3 (Week 2): Pt will transfer consistently to toilet with MOD A and LRAD OT Short Term Goal 4 (Week 2): Pt will recall adapative bathing strategies with MIN cuing and supervision   Skilled Therapeutic Interventions/Progress Updates:    Pt received in w/c with no c/o pain, agreeable to OT session focused on RUE NMR. From seated pt worked on isolated shoulder flexion/extension and elbow flexion/extension using arm skate to remove gravity demands, requiring min-mod facilitation and cueing for visual input/attention. Functional task of reaching forward and folding towels completed with mod-max A. Pt still requires mod cueing for allowing R UE to attempt reaching tasks and not providing total A from LUE. Rest breaks needed for both cognitive and muscular demands. From supine pt worked on closed chain bimanual reaching using unweighted rod- with shoulder flexion to 90 degrees completed without assist. Isolated shoulder ER/IR required mod-max facilitation and shoulder abduction. Pt left sitting up in the w/c with all needs met.   Saebo Stim One applied to pt's R shoulder for joint approximation and muscle activation for 60 min unattended. No adverse skin reactions or pain reported.  330 pulse width 35 Hz pulse rate On 8 sec/ off 8 sec Ramp up/ down 2 sec Symmetrical Biphasic wave form  Max intensity 160m at 500 Ohm load   Session 2: Pt received sitting in the w/c with no c/o pain,  agreeable to OT session. Session focused on RUE NMR with reciprocal movement and AROM using the BUE ergometer. Manual facilitation provided throughout activity to provide glenohumeral and scapular stability/rhythm. Pt returned to her room and was left sitting up with all needs met.    Therapy Documentation Precautions:  Precautions Precautions: Fall Precaution Comments: R hemiparesis Required Braces or Orthoses: Sling Restrictions Weight Bearing Restrictions: No RLE Weight Bearing: Weight bearing as tolerated Therapy/Group: Individual Therapy  SCurtis Sites8/06/2021, 6:09 AM

## 2021-05-15 NOTE — Progress Notes (Addendum)
Patient ID: Bonniejean S Troia, female   DOB: 09/26/1941, 79 y.o.   MRN: [id number]  SW met with pt, and pt sister in law in room to provide updates from team conference, and d/c date 8/19 remains. Pt SIL called pt son Scott.Pt son reported that someone from [facility] (Meg W) called him to inform they were the preferred provider from the home health agency. SW encouraged pt son to call the surgeon's office to discuss with them further. SW informed that a HHA list is provided, and you select your agency preference since we are not able to give suggestions. He reported that he would look into this and ask some people he knows about getting her a HHA. Fam edu scheduled for Monday (8/15)/Tuesday (8/16) 1pm-3pm with pt son and SIL since they will be the primaries helping pt. SW to continue to follow pt for d/c needs.   Auria Chamberlain, MSW, LCSWA Office: [phone] Cell: [phone] Fax: [phone]  

## 2021-05-15 NOTE — Progress Notes (Signed)
Physical Therapy Session Note  Patient Details  Name: Angela Carpenter MRN: [id number] Date of Birth: 1941-03-15  Today's Date: 05/15/2021 PT Individual Time: 0800-0858 PT Individual Time Calculation (min): 58 min   Short Term Goals: Week 2:  PT Short Term Goal 1 (Week 2): supine to side to sit w/cga only PT Short Term Goal 2 (Week 2): pt will propel wc using hemi technique x 168f w/additional time and supervision PT Short Term Goal 3 (Week 2): Patient will perform basic transfers with min A consistently. PT Short Term Goal 4 (Week 2): Patient will ambulate >75 feet with mod A using LRAD.  Skilled Therapeutic Interventions/Progress Updates:     Pt supine in bed at start of session. RN at bedside administering morning medications. Pt denies pain but reports lower back itchiness - dry skin with some redness noted - applied topical lotion and notified RN during session to assess. Supine<>sit completed with minA with assist for RLE management (HOB elevated ~40deg). Requires modA for forward scooting at EOB. Completed squat<>pivot transfer with modA and R knee block from EOB to w/c with cues for forward weight shift and sequencing. Pt then wheeled to main rehab gym for time. Completed additional squat<>pivot transfer with modA towards mat table with increased emphasis on forward weight shift and hip clearance - required x2 scoots to reach mat table. Worked on forward leans with task for placing cones onto 6inch platform to improve confidence with forward leans to carryover into functional transfers. Worked on repeated (2x10) sit<>stands with modA and R knee block - tactile cues for forward weight shift during BOTH ascent AND descent with education on hip hinge technique. Continued sit<>stand practice with task overlay for hanging resistive clothespins to basketball net that was on her R side to encourage R weight shift and R truncal rotation - requires modA for standing balance during this. Squat<>pivot  transfer with a light modA back to her w/c (improved ability to forward lean!) and then returned to her room. She requested to use toilet once we returned - time not permitting and NT notified and handoff of care to NT to assist with toileting. All needs met.   Therapy Documentation Precautions:  Precautions Precautions: Fall Precaution Comments: R hemiparesis Required Braces or Orthoses: Sling Restrictions Weight Bearing Restrictions: No RLE Weight Bearing: Weight bearing as tolerated General:     Therapy/Group: Individual Therapy  CAlger Simons8/06/2021, 7:35 AM

## 2021-05-15 NOTE — Progress Notes (Signed)
PROGRESS NOTE   Subjective/Complaints:  Asking about staple removal was told by nsg order was in chart but not completed   ROS: Patient denies CP, SOB, N/V/D Objective:   No results found. Recent Labs    05/14/21 0553  WBC 15.8*  HGB 12.9  HCT 38.3  PLT 248     Recent Labs    05/14/21 0553  NA 135  K 3.8  CL 101  CO2 27  GLUCOSE 93  BUN 21  CREATININE 0.53  CALCIUM 8.8*      Intake/Output Summary (Last 24 hours) at 05/15/2021 0938 Last data filed at 05/15/2021 [id number] Gross per 24 hour  Intake 1054 ml  Output --  Net 1054 ml         Physical Exam: Vital Signs Blood pressure 111/61, pulse 82, temperature 97.8 F (36.6 C), temperature source Oral, resp. rate 16, height '5\' 5"'$  (1.651 m), weight 70.3 kg, SpO2 100 %.   General: No acute distress Mood and affect are appropriate Heart: Regular rate and rhythm no rubs murmurs or extra sounds Lungs: Clear to auscultation, breathing unlabored, no rales or wheezes Abdomen: Positive bowel sounds, soft nontender to palpation, nondistended Extremities: No clubbing, cyanosis, or edema Skin: No evidence of breakdown, no evidence of rash    visual deficits. Speech somewhat fluent but has word finding deficits. RUE: tr Pecs, 1+ EE, 1+/5 finger flexors, trace wrist.   RLE 0/5 except trace toe flexion, inconsistent  LUE and LLE 4/5.---exam stable. Slight decr LT RUE/RLE.  DTR's absent Musculoskeletal: Full ROM, No pain with PROM in the neck, trunk, or extremities. Posture appropriate.     Assessment/Plan: 1. Functional deficits which require 3+ hours per day of interdisciplinary therapy in a comprehensive inpatient rehab setting. Physiatrist is providing close team supervision and 24 hour management of active medical problems listed below. Physiatrist and rehab team continue to assess barriers to discharge/monitor patient progress toward functional and medical  goals  Care Tool:  Bathing    Body parts bathed by patient: Right arm, Chest, Abdomen, Right upper leg, Left upper leg, Face, Front perineal area, Buttocks, Right lower leg, Left lower leg   Body parts bathed by helper: Left arm, Buttocks, Front perineal area, Right lower leg, Left lower leg Body parts n/a: Front perineal area   Bathing assist Assist Level: Minimal Assistance - Patient > 75%     Upper Body Dressing/Undressing Upper body dressing   What is the patient wearing?: Hospital gown only Orthosis activity level: Performed by helper  Upper body assist Assist Level: Moderate Assistance - Patient 50 - 74%    Lower Body Dressing/Undressing Lower body dressing      What is the patient wearing?: Incontinence brief     Lower body assist Assist for lower body dressing: Maximal Assistance - Patient 25 - 49%     Toileting Toileting    Toileting assist Assist for toileting: 2 Helpers     Transfers Chair/bed transfer  Transfers assist     Chair/bed transfer assist level: Minimal Assistance - Patient > 75% Chair/bed transfer assistive device: Sliding board   Locomotion Ambulation   Ambulation assist   Ambulation activity did not  occur: Safety/medical concerns  Assist level: Moderate Assistance - Patient 50 - 74% Assistive device: Walker-rolling Max distance: 40 ft   Walk 10 feet activity   Assist  Walk 10 feet activity did not occur: Safety/medical concerns  Assist level: Moderate Assistance - Patient - 50 - 74% Assistive device: Walker-rolling   Walk 50 feet activity   Assist Walk 50 feet with 2 turns activity did not occur: Safety/medical concerns  Assist level: Dependent - Patient 0% Assistive device: Lite Gait    Walk 150 feet activity   Assist Walk 150 feet activity did not occur: Safety/medical concerns  Assist level: Dependent - Patient 0% Assistive device: Lite Gait    Walk 10 feet on uneven surface  activity   Assist Walk 10  feet on uneven surfaces activity did not occur: Safety/medical concerns         Wheelchair     Assist Will patient use wheelchair at discharge?: Yes Type of Wheelchair: Manual    Wheelchair assist level: Supervision/Verbal cueing Max wheelchair distance: 100 ft    Wheelchair 50 feet with 2 turns activity    Assist        Assist Level: Supervision/Verbal cueing   Wheelchair 150 feet activity     Assist      Assist Level: Maximal Assistance - Patient 25 - 49%   Blood pressure 111/61, pulse 82, temperature 97.8 F (36.6 C), temperature source Oral, resp. rate 16, height '5\' 5"'$  (1.651 m), weight 70.3 kg, SpO2 100 %.  Medical Problem List and Plan: 1.  R hemiplegia secondary to Metastatic disease from melanoma s/p L frontal crani 05/01/21             -patient may  shower             -ELOS/Goals: 2-3 weeks - goals min A  -Continue CIR therapies including PT, OT, and SLP  team conf itoday  2.  Age indeterminate DVT in right popliteal vein, and in bilateral posterior tibial and peroneal veins. Chronic DVT in right common femoral and femoral veins. Given age indeterminate DVTs and history of ICH  Dr. Mohamed/NS  recommended prophylactic Lovenox- no IVC filter- resumption of Eliquis in a week or two---will d/w NS wk of 8/15             -antiplatelet therapy: N/A 3. Pain Management: Tylenol prn.  4. Mood: LCSW to follow for evaluation and support.              -antipsychotic agents: N/A 5. Neuropsych: This patient is capable of making decisions on her own behalf. 6. Skin/Wound Care:  remove staples today 8/7- not done yet 8/9 7. Fluids/Electrolytes/Nutrition: encourage PO  -intake excellent   -protein supp for albumin 8. Paroxymal A fib:/HTN: Monitor BP tid--continue metoprolol daily.             --Off Eliquis due to hemorrhage  -HR still controlled 9. Glaucoma: Has been stable on timoptic--will resume.  10. Pre-diabetes: Hgb A1C-6.1. Diet liberalized to regular per  patient/family request.             - monitor FBS weekly 11.  Leucocytosis: Likely due to steroid effect, DVT.              --no fevers or other signs of infection.  -21.4 8/1---still no signs of infection. Repeat Ucx negative   -f/u CBC Monday 8/8 12. Hyponatremia: Sodium up to 135  .  13. Urinary retention: last cath 8/7 at 0100  -  continue urecholine at 50 mg tid  -continue flomax at night. bp is soft but holding  -OOB to toilet/bsc to void      14. Constipation: moved bowels this w/e   - scheduled miralax bid along with senna-s--BM x 2 yesterday     LOS: 12 days A FACE TO FACE EVALUATION WAS PERFORMED  Charlett Blake 05/15/2021, 9:38 AM

## 2021-05-15 NOTE — Patient Care Conference (Signed)
Inpatient RehabilitationTeam Conference and Plan of Care Update Date: 05/15/2021   Time: 10:15 AM    Patient Name: Angela Carpenter      Medical Record Number: [id number]  Date of Birth: December 10, 1940 Sex: Female         Room/Bed: 4W05C/4W05C-01 Payor Info: Payor: MEDICARE / Plan: MEDICARE PART A AND B / Product Type: *No Product type* /    Admit Date/Time:  05/03/2021  3:16 PM  Primary Diagnosis:  Metastatic cancer to brain [facility])  Hospital Problems: Principal Problem:   Metastatic cancer to brain [facility]) Active Problems:   Seizure disorder, focal motor ([place])   Right hemiplegia [facility])    Expected Discharge Date: Expected Discharge Date: 05/25/21  Team Members Present: Physician leading conference: Dr. Leeroy Cha Social Worker Present: [name], [place] Nurse Present: Dorthula Nettles, RN PT Present: Tereasa Coop, PT OT Present: Laverle Hobby, OT SLP Present: Lillie Columbia, SLP PPS Coordinator present : Gunnar Fusi, SLP     Current Status/Progress Goal Weekly Team Focus  Bowel/Bladder   pt cont of b and b has urgency lbm 05/14/21  remai cont of b and b  timed toilet q 4 hours   Swallow/Nutrition/ Hydration             ADL's   Min A UB, mod A LB, RUE with great return- flexion and extension gravity eliminated, mod A transfers  (S) to min A  RUE NMR, transfers, hemi technique, ADLs   Mobility   min A for bed mobility and slide-board transfers, min-mod A for lateral scoot, squat pivot , and sit-to-stand transfers, mod A for gait up to 40 feet using RW and R AFO, and 100 feet, limited by fatigue, w/c propulsion using hemi-technique with supervision.  CGA bed mobility and transfers mod A gait and stairs  Functional transfers and gait training, pt education, R NMR, safety training   Communication   Mod I  Supervision  Continued self monitoring and repair of occasional word substitutions in which patient has able to execute independently   Safety/Cognition/ Behavioral  Observations  sup A - progressing well!  Sup A  complex problem solving and attention   Pain   pt has no current c/o pain         Skin   surgical incicion on head, blister on feet  remain free of skin break down and infection  staples open to air, blister on foot foam dressing     Discharge Planning:  D/c to home with support from son who will move in the home, as well as, siblings and in-laws.   Team Discussion: Constipated, has urinary retention, hypotension, staples to crani. Continent B/B, dry blister, incision to scalp CDI with staples. Min assist upper body ADL's, mod assist lower body ADL's, getting nice anterior deltoid return. Supervision to min assist goals. Mod assist with gait, 40 ft. and stairs. Min assist high level cognitive tasks. Patient on target to meet rehab goals: yes  *See Care Plan and progress notes for long and short-term goals.   Revisions to Treatment Plan:  Not at this time.  Teaching Needs: Family education, medication management, pain management, skin/wound care, transfer training, gait training, balance training, endurance training, stair training, safety awareness.  Current Barriers to Discharge: Decreased caregiver support, Medical stability, Home enviroment access/layout, Neurogenic bowel and bladder, Wound care, Lack of/limited family support, Weight, and Medication compliance  Possible Resolutions to Barriers: Continue current medications, provide emotional support.     Medical Summary Current  Status: Hypotension, staples to crani site, age indeterminate DVT (multiple), constipation, urinary retention  Barriers to Discharge: Wound care;Medical stability;Neurogenic Bowel & Bladder  Barriers to Discharge Comments: Hypotension, staples to crani site, age indeterminate DVT (multiple), constipation, urinary retention Possible Resolutions to [employer]: continue to monitor BP TID, discuss with NSGY next week resumption of Eliquis, bowel  regimen increased with good effect, continue urecholine and flomax   Continued Need for Acute Rehabilitation Level of Care: The patient requires daily medical management by a physician with specialized training in physical medicine and rehabilitation for the following reasons: Direction of a multidisciplinary physical rehabilitation program to maximize functional independence : Yes Medical management of patient stability for increased activity during participation in an intensive rehabilitation regime.: Yes Analysis of laboratory values and/or radiology reports with any subsequent need for medication adjustment and/or medical intervention. : Yes   I attest that I was present, lead the team conference, and concur with the assessment and plan of the team.   Cristi Loron 05/15/2021, 4:08 PM

## 2021-05-15 NOTE — Progress Notes (Signed)
Speech Language Pathology TBI Note  Patient Details  Name: Angela Carpenter MRN: [id number] Date of Birth: 20-Jun-1941  Today's Date: 05/15/2021 SLP Individual Time: 08-1529 SLP Individual Time Calculation (min): 43 min  Short Term Goals: Week 2: SLP Short Term Goal 1 (Week 2): Pt will complete medication and financial management for complex problem solving with min verbal cueing. SLP Short Term Goal 2 (Week 2): Patient will demonstrate alternating attention between 2 functional tasks for 30 minutes with Min verbal cues for redirection. SLP Short Term Goal 3 (Week 2): Pt will demonstrate use of word finding strategies to decrease anomia in structured language tasks given min verbal cueing. SLP Short Term Goal 4 (Week 2): Patient will follow multi-step commands with Min verbal cues.  Skilled Therapeutic Interventions:Skilled ST services focused on cognitive skills. SLP facilitated recall and verbal problem solving skills of prior verse current medication creating medication list, pt demonstrated mod I for medication consumed 1-2x per day, similar to baseline consumption. SLP facilitated mildly complex problem solving skills in two organization tasks in which pt dictated responses due to impairment in dominant right hand. Pt required supervision A verbal cues fade to mod I for problem solving, error awareness and use of recall strategies within task. Pt was left in room with call bell within reach and chair alarm set. SLP recommends to continue skilled services.     Pain Pain Assessment Pain Score: 0-No pain  Agitated Behavior Scale: TBI Observation Details Observation Environment: pt room Start of observation period - Date: 05/15/21 Start of observation period - Time: 0247 End of observation period - Date: 05/15/21 End of observation period - Time: 0330 Agitated Behavior Scale (DO NOT LEAVE BLANKS) Short attention span, easy distractibility, inability to concentrate: Present to a slight  degree Impulsive, impatient, low tolerance for pain or frustration: Present to a slight degree Uncooperative, resistant to care, demanding: Present to a slight degree Violent and/or threatening violence toward people or property: Absent Explosive and/or unpredictable anger: Absent Rocking, rubbing, moaning, or other self-stimulating behavior: Absent Pulling at tubes, restraints, etc.: Absent Wandering from treatment areas: Absent Restlessness, pacing, excessive movement: Absent Repetitive behaviors, motor, and/or verbal: Absent Rapid, loud, or excessive talking: Present to a slight degree Sudden changes of mood: Absent Easily initiated or excessive crying and/or laughter: Absent Self-abusiveness, physical and/or verbal: Absent Agitated behavior scale total score: 18  Therapy/Group: Individual Therapy  Mckenzie Bove  [facility] 05/15/2021, 3:53 PM

## 2021-05-16 ENCOUNTER — Telehealth: Payer: Self-pay | Admitting: Radiation Therapy

## 2021-05-16 DIAGNOSIS — D72828 Other elevated white blood cell count: Secondary | ICD-10-CM

## 2021-05-16 DIAGNOSIS — K5901 Slow transit constipation: Secondary | ICD-10-CM

## 2021-05-16 DIAGNOSIS — G8191 Hemiplegia, unspecified affecting right dominant side: Secondary | ICD-10-CM

## 2021-05-16 DIAGNOSIS — R339 Retention of urine, unspecified: Secondary | ICD-10-CM

## 2021-05-16 DIAGNOSIS — D72829 Elevated white blood cell count, unspecified: Secondary | ICD-10-CM

## 2021-05-16 MED ORDER — CAMPHOR-MENTHOL 0.5-0.5 % EX LOTN
TOPICAL_LOTION | CUTANEOUS | Status: DC | PRN
  Filled 2021-05-16: qty 222

## 2021-05-16 MED ORDER — TRIAMCINOLONE 0.1 % CREAM:EUCERIN CREAM 1:1
TOPICAL_CREAM | Freq: Three times a day (TID) | CUTANEOUS | Status: DC
  Administered 2021-05-21: 1 via TOPICAL
  Filled 2021-05-16: qty 1

## 2021-05-16 NOTE — Progress Notes (Signed)
Occupational Therapy Weekly Progress Note  Patient Details  Name: Angela Carpenter MRN: [id number] Date of Birth: 03/20/1941  Beginning of progress report period: May 10, 2021 End of progress report period: May 16, 2021   Patient has met 2 of 3 short term goals.  Shermaine continues to make great progress toward her OT goals. She has had great return in her RUE this reporting period with gravity eliminated full ROM activation in her biceps and triceps. She is able to maintain a functional grasp with her R hand and her shoulder sublux has reduced significantly with anterior deltoid activation. She requires min A for UB ADLs and mod A for LB.   Patient continues to demonstrate the following deficits: muscle weakness, unbalanced muscle activation, decreased coordination, and decreased motor planning, decreased attention to right, decreased initiation, decreased attention, decreased awareness, decreased problem solving, decreased safety awareness, decreased memory, and delayed processing, and decreased standing balance, decreased postural control, and hemiplegia and therefore will continue to benefit from skilled OT intervention to enhance overall performance with BADL and Reduce care partner burden.  Patient progressing toward long term goals..  Continue plan of care.  OT Short Term Goals Week 2:  OT Short Term Goal 1 (Week 2): Pt will complete UB dressing with MIN A OT Short Term Goal 1 - Progress (Week 2): Met OT Short Term Goal 2 (Week 2): Pt will thread BLE into pants with A for LE positioning only PRN OT Short Term Goal 2 - Progress (Week 2): Progressing toward goal OT Short Term Goal 3 (Week 2): Pt will transfer consistently to toilet with MOD A and LRAD OT Short Term Goal 3 - Progress (Week 2): Met OT Short Term Goal 4 (Week 2): Pt will recall adapative bathing strategies with MIN cuing and supervision OT Short Term Goal 4 - Progress (Week 2): Met Week 3:  OT Short Term Goal 1 (Week 3):  STG= LTG d/t ELOS   Curtis Sites 05/16/2021, 10:18 AM

## 2021-05-16 NOTE — Progress Notes (Signed)
Physical Therapy Session Note  Patient Details  Name: Angela Carpenter MRN: [id number] Date of Birth: 14-Oct-1940  Today's Date: 05/16/2021 PT Individual Time: 1020-1100 and 1417-1500 PT Individual Time Calculation (min): 40 min and 43 min and Today's Date: 05/16/2021 PT Missed Time: 20 Minutes Missed Time Reason: Nursing care  Short Term Goals: Week 2:  PT Short Term Goal 1 (Week 2): supine to side to sit w/cga only PT Short Term Goal 2 (Week 2): pt will propel wc using hemi technique x 138f w/additional time and supervision PT Short Term Goal 3 (Week 2): Patient will perform basic transfers with min A consistently. PT Short Term Goal 4 (Week 2): Patient will ambulate >75 feet with mod A using LRAD.  Skilled Therapeutic Interventions/Progress Updates:     Pt misses 20 minutes of skilled PT due to RN being in room performing bladder scan. Upon follow up, pt agreeable to therapy and pt's sister-in-law requesting to observe session. Pt does not report any pain. Supine to sit with minA for management of R lower extremity and cues for sequencing. PT re-educates pt on head hips relationship and importance of anterior weight transition for scooting and transfers. Pt then performs squat pivot transfer to the [place] to [place]. WC transport to gym for time management. Squat pivot to mat table with modA.   PT places cup in front of pt on ground and has pt flex forward at trunk and shoulders to reach for cup. From this position pt cued to stand up and does so with modA. Pt works on weight shifting in standing position with cues for posture and anterior weight transition. Pt able to maintain static standing balance for ~1 minute with very close supervision, but eventually loses balance backward. Following seated rest break pt performs sit to stand again using cup technique. Following static standing practice, pt attempts to place cup back down as she sits back down. R knee buckles when attempting and pt sits  suddenly back on table.  Pt performs gait training with PT under pt's R upper extremity and tech providing HHA on L. Pt ambulates x40' with modA +2 and PT facilitating lateral weight shifting, some progression of R leg during swing phase, and light blocking of R knee during stance phase, through no buckling noted. WC transport back to room. Left seated with alarm intact and all needs within reach.  2nd Session: [place] therapy. No complaint of pain but reports being discouraged about lack of bowel and bladder function and anticipating being catheterized after session. WC transport to gym for time management. Pt performs squat pivot transfer to matt able toward the R with modA and cues for body mechanics and sequencing.  Pt performs various activities on mat working on balance, NMR for R muscular return, and strengthening. Initially pt performs seated to L side leaning on L elbow and returning midline, gradually increasing degree to which pt is sideleaning to increase difficulty. Pt then performs same activity leaning to the R with PT providing manual assistance to position R elbow. Pt able to return to midline each attempt without physical assistance. 1x9 on each side.  Pt performs x10 seated partial sit-ups, eccentrically controlling decline until laying back on wedge and then returning to upright position. No physical assistance required.  Sit to supine with modA and management of R lower extremity. Pt performs 1x10 L hip flexion, then rolls onto L side and R leg positioned on platform to  provide decreased friction and gravity minimized movement. Pt completes 2x10 R leg flexion and extension patterns with PT providing AAROM including quick stretch and tactile cues for improved contraction of muscle groups.  Sidelying to sit with minA and cue for logrolling. Squat pivot to WC with modA. Pt left seated with alarm intact and all needs within reach.  Therapy  Documentation Precautions:  Precautions Precautions: Fall Precaution Comments: R hemiparesis Required Braces or Orthoses: Sling Restrictions Weight Bearing Restrictions: No RLE Weight Bearing: Weight bearing as tolerated    Therapy/Group: Individual Therapy  Breck Coons, PT, DPT 05/16/2021, 3:20 PM

## 2021-05-16 NOTE — Progress Notes (Signed)
PROGRESS NOTE   Subjective/Complaints: Patient seen sitting up in bed this AM.  She states she slept well overnight.   ROS: Denies CP, SOB, N/V/D  Objective:   No results found. Recent Labs    05/14/21 0553  WBC 15.8*  HGB 12.9  HCT 38.3  PLT 248     Recent Labs    05/14/21 0553  NA 135  K 3.8  CL 101  CO2 27  GLUCOSE 93  BUN 21  CREATININE 0.53  CALCIUM 8.8*      Intake/Output Summary (Last 24 hours) at 05/16/2021 1017 Last data filed at 05/16/2021 0730 Gross per 24 hour  Intake 828 ml  Output --  Net 828 ml         Physical Exam: Vital Signs Blood pressure (!) 103/57, pulse 79, temperature 98.1 F (36.7 C), temperature source Oral, resp. rate 17, height '5\' 5"'$  (1.651 m), weight 70.3 kg, SpO2 100 %. Constitutional: No distress . Vital signs reviewed. HENT scalp with staples and dried blood. Eyes: EOMI. No discharge. Cardiovascular: No JVD.  RRR. Respiratory: Normal effort.  No stridor.  Bilateral clear to auscultation. GI: Non-distended.  BS +. Skin: Warm and dry.  Dried blood along staple lines. Psych: Normal mood.  Normal behavior. Musc: No edema in extremities.  No tenderness in extremities. Neuro: Alert and oriented Motor: RUE: Proximally 0/5, hand grip 3+/5 RLE: Proximally 0/5, ankle dorsiflexion 1+/5  Assessment/Plan: 1. Functional deficits which require 3+ hours per day of interdisciplinary therapy in a comprehensive inpatient rehab setting. Physiatrist is providing close team supervision and 24 hour management of active medical problems listed below. Physiatrist and rehab team continue to assess barriers to discharge/monitor patient progress toward functional and medical goals  Care Tool:  Bathing    Body parts bathed by patient: Right arm, Chest, Abdomen, Right upper leg, Left upper leg, Face, Front perineal area, Buttocks, Right lower leg, Left lower leg   Body parts bathed by  helper: Left arm, Buttocks, Front perineal area, Right lower leg, Left lower leg Body parts n/a: Front perineal area   Bathing assist Assist Level: Minimal Assistance - Patient > 75%     Upper Body Dressing/Undressing Upper body dressing   What is the patient wearing?: Hospital gown only Orthosis activity level: Performed by helper  Upper body assist Assist Level: Moderate Assistance - Patient 50 - 74%    Lower Body Dressing/Undressing Lower body dressing      What is the patient wearing?: Incontinence brief     Lower body assist Assist for lower body dressing: Maximal Assistance - Patient 25 - 49%     Toileting Toileting    Toileting assist Assist for toileting: 2 Helpers     Transfers Chair/bed transfer  Transfers assist     Chair/bed transfer assist level: Minimal Assistance - Patient > 75% Chair/bed transfer assistive device: Sliding board   Locomotion Ambulation   Ambulation assist   Ambulation activity did not occur: Safety/medical concerns  Assist level: Moderate Assistance - Patient 50 - 74% Assistive device: Walker-rolling Max distance: 40 ft   Walk 10 feet activity   Assist  Walk 10 feet activity did not occur:  Safety/medical concerns  Assist level: Moderate Assistance - Patient - 50 - 74% Assistive device: Walker-rolling   Walk 50 feet activity   Assist Walk 50 feet with 2 turns activity did not occur: Safety/medical concerns  Assist level: Dependent - Patient 0% Assistive device: Lite Gait    Walk 150 feet activity   Assist Walk 150 feet activity did not occur: Safety/medical concerns  Assist level: Dependent - Patient 0% Assistive device: Lite Gait    Walk 10 feet on uneven surface  activity   Assist Walk 10 feet on uneven surfaces activity did not occur: Safety/medical concerns         Wheelchair     Assist Will patient use wheelchair at discharge?: Yes Type of Wheelchair: Manual    Wheelchair assist level:  Supervision/Verbal cueing Max wheelchair distance: 100 ft    Wheelchair 50 feet with 2 turns activity    Assist        Assist Level: Supervision/Verbal cueing   Wheelchair 150 feet activity     Assist      Assist Level: Maximal Assistance - Patient 25 - 49%   Blood pressure (!) 103/57, pulse 79, temperature 98.1 F (36.7 C), temperature source Oral, resp. rate 17, height '5\' 5"'$  (1.651 m), weight 70.3 kg, SpO2 100 %.  Medical Problem List and Plan: 1.  R hemiplegia secondary to Metastatic disease from melanoma s/p L frontal crani 05/01/21  Continue CIR 2.  Age indeterminate DVT in right popliteal vein, and in bilateral posterior tibial and peroneal veins. Chronic DVT in right common femoral and femoral veins. Given age indeterminate DVTs and history of ICH  Dr. Mohamed/NS  recommended prophylactic Lovenox- no IVC filter- resumption of Eliquis in a week or two--- plan to d/w NS wk of 8/15             -antiplatelet therapy: N/A 3. Pain Management: Tylenol prn.  4. Mood: LCSW to follow for evaluation and support.              -antipsychotic agents: N/A 5. Neuropsych: This patient is capable of making decisions on her own behalf. 6. Skin/Wound Care: DC staples, order placed again 7. Fluids/Electrolytes/Nutrition: encourage PO  -intake excellent   -protein supp for albumin 8. Paroxymal A fib:/HTN: Monitor BP tid--continue metoprolol daily.             --Off Eliquis due to hemorrhage  Controlled on/10 9. Glaucoma: Has been stable on timoptic--will resume.  10. Pre-diabetes: Hgb A1C-6.1. Diet liberalized to regular per patient/family request.             - monitor FBS weekly 11.  Leucocytosis:              --no fevers or other signs of infection.  WBCs 15.8 on 8/8  Cont to monitor 12. Hyponatremia: Sodium up to 135 on 8/8  Cont to monitor  13. Urinary retention:   -continue urecholine at 50 mg tid  -continue flomax at night. bp is soft but holding  -OOB to toilet/bsc to  void  Improving   14. Constipation:   - scheduled miralax bid along with senna-s--BM x 2 yesterday   Improving   LOS: 13 days A FACE TO FACE EVALUATION WAS PERFORMED  Angela Carpenter Angela Carpenter 05/16/2021, 10:17 AM

## 2021-05-16 NOTE — Telephone Encounter (Signed)
I spoke with [name] about his mother's planned discharge date of 8/19, and the upcoming appointments with Med Onc and Rad Onc for post op radiation treatment planning. [name] has seen these appointments in his mother's MyChart account and said the he will have her there. She does have limited mobility and will need assistance getting out of the wheelchair and onto the treatment tables.   The order for [name] staple removal has been entered, I spoke with the nurse caring for her on the rehab floor who said this should be completed today.   Mont Dutton R.T.(R)(T) Radiation Special Procedures Navigator

## 2021-05-16 NOTE — Progress Notes (Signed)
Speech Language Pathology Daily Session Note  Patient Details  Name: Angela Carpenter MRN: [id number] Date of Birth: 1940/10/25  Today's Date: 05/16/2021 SLP Individual Time: [id number] SLP Individual Time Calculation (min): 40 min  Short Term Goals: Week 2: SLP Short Term Goal 1 (Week 2): Pt will complete medication and financial management for complex problem solving with min verbal cueing. SLP Short Term Goal 2 (Week 2): Patient will demonstrate alternating attention between 2 functional tasks for 30 minutes with Min verbal cues for redirection. SLP Short Term Goal 3 (Week 2): Pt will demonstrate use of word finding strategies to decrease anomia in structured language tasks given min verbal cueing. SLP Short Term Goal 4 (Week 2): Patient will follow multi-step commands with Min verbal cues.  Skilled Therapeutic Interventions: Skilled treatment session focused on recall and carryover of functional information. Patient's OT/PT report decreased carryover of specific techniques. With extra time and Min cueing, patient able to verbally sequence hand placement for transfers and dressing techniques. Patient also appeared to do better when talking about "weaker side" vs "strong side" due to frequently confusing right and left. Patient left upright in wheelchair with alarm on and all needs within reach. Continue with current plan of care.      Pain No/Denies Pain   Therapy/Group: Individual Therapy  Khaniya Tenaglia 05/16/2021, 3:31 PM

## 2021-05-16 NOTE — Progress Notes (Signed)
Occupational Therapy Session Note  Patient Details  Name: Angela Carpenter MRN: [id number] Date of Birth: Nov 09, 1940  Today's Date: 05/16/2021 OT Individual Time: [id number] OT Individual Time Calculation (min): 56 min    Short Term Goals: Week 2:  OT Short Term Goal 1 (Week 2): Pt will complete UB dressing with MIN A OT Short Term Goal 2 (Week 2): Pt will thread BLE into pants with A for LE positioning only PRN OT Short Term Goal 3 (Week 2): Pt will transfer consistently to toilet with MOD A and LRAD OT Short Term Goal 4 (Week 2): Pt will recall adapative bathing strategies with MIN cuing and supervision  Skilled Therapeutic Interventions/Progress Updates:    Pt sitting in w/c with no c/o pain, agreeable to earlier than scheduled session and shower level ADLs. Session focused on R NMR and integration of RUE and BUE coordination functionally. Min A stand pivot into shower with grab bar use. Discussed home equipment and planning home shower transfers. Recommend pt wait for HHOT to complete first shower. UB bathing with min A to lift under RUE and for full RUE reaching to LUE. Min cueing required for RUE integration into tasks and for not providing total A but graded assist to increase RUE volitional activation. Sit > stand with mod A, but pt able to remove her LUE from the grab bar for posterior peri hygiene with min A balance support posteriorly. Pt still requires cueing for forward lean to counter posterior bias/retropulsion. UB dressing with min A. Max A to don incontinence brief d/t time constraints. Brought reacher into room to work with LB dressing. Ambulatory transfer from shower to toilet for urine void attempt. NT took over supervision from toilet as OT exited room.   Therapy Documentation Precautions:  Precautions Precautions: Fall Precaution Comments: R hemiparesis Required Braces or Orthoses: Sling Restrictions Weight Bearing Restrictions: No RLE Weight Bearing: Weight bearing as  tolerated   Therapy/Group: Individual Therapy  Curtis Sites 05/16/2021, 6:08 AM

## 2021-05-16 NOTE — Progress Notes (Signed)
Patient ID: Angela Carpenter, female   DOB: [birth year], 80 y.o.   MRN: [id number]  SW received phone call from [facility] reporting that pt was accepted for services after patient's son made contact with the agency. SW will provide updates once there is more information on patient care needs.   SW called pt son [phone]) to inform on above, and will provide updates to the liaison as there are more updates.   [name], MSW, [place] Office: [phone] Cell: [phone] Fax: [phone]

## 2021-05-16 NOTE — Progress Notes (Signed)
Patient sitting up in chair in room. Alert, oriented x 4. Denies any pain or discomfort. Order given to remove staples in head. 26 staples removed. Site remains intact. Scant serous drainage noted from area. Patient tolerated procedure well. Stable at this time.

## 2021-05-16 NOTE — Progress Notes (Signed)
Bladder scanned and 667m noted. In/Out catheterization performed at this time due to inability to urinate. 9038mof clear, yellow urine obtained and patient tolerated procedure well.

## 2021-05-17 NOTE — Evaluation (Signed)
Recreational Therapy Assessment and Plan  Patient Details  Name: Angela Carpenter MRN: [id number] Date of Birth: 12-16-1940 Today's Date: 05/17/2021  Rehab Potential:  Good ELOS:   8/19  Assessment Hospital Problem: Principal Problem:   Metastatic cancer to brain [facility]) Active Problems:   Seizure disorder, focal motor ([place])   Right hemiplegia ([place])     Past Medical History:      Past Medical History:  Diagnosis Date   A-fib [facility])     Atrial fibrillation ([place])      4 years ago started   Brain cancer ([place]) 12/2020   Breast cancer ([place]) 2006   Colon polyps     Glaucoma     Hypercholesteremia     Hypertension      patient denies    Osteoporosis     Personal history of chemotherapy 2006   Personal history of radiation therapy 2006   Stroke (cerebrum) ([place]) 2015    Past Surgical History:       Past Surgical History:  Procedure Laterality Date   APPLICATION OF CRANIAL NAVIGATION Left 05/01/2021    Procedure: APPLICATION OF CRANIAL NAVIGATION;  Surgeon: Consuella Lose, MD;  Location: [place];  Service: Neurosurgery;  Laterality: Left;   AUGMENTATION MAMMAPLASTY Bilateral 2006    Patient had them removed in 2008   BREAST BIOPSY Left 2016   BREAST LUMPECTOMY Right 2006   COLONOSCOPY        had polyps   CRANIOTOMY Left 05/01/2021    Procedure: STEREOTACTIC FRONTAL CRANIOTOMY TUMOR EXCISION;  Surgeon: Consuella Lose, MD;  Location: [place];  Service: Neurosurgery;  Laterality: Left;   EYE SURGERY Right      cataract   MELANOMA EXCISION WITH SENTINEL LYMPH NODE BIOPSY N/A 06/02/2019    Procedure: Left lateral ANAL MELANOMA EXCISION WITH Bilateral Groin exploration and SENTINEL node mapping;  Surgeon: [name], MD;  Location: [place];  Service: General;  Laterality: N/A;   TONSILLECTOMY          Assessment & Plan Clinical Impression:   Pt is a 80 y/o female presenting to ED with worsening R sided weakness in setting of marked expansion of L frotnal hemorrhagic met with  surrounding vasogenic edema on 04/23/21. Patient now completely flaccid in RUE/RLE with dysarthria. Pt s/p L frontal craniotomy for resection of metastatic hemorrhagic tumor on 05/01/21. 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    Met with pt today to discuss leisure interests and activity anlaysis. Pt presents with decreased activity tolerance, decreased functional mobility, decreased balance, decreased coordination, decreased awareness, decreased problem solving, decreased safety awareness, and decreased memory Limiting pt's independence with leisure/community pursuits.  Plan  Min 1 TR session >20 minutes during LOS  Recommendations for other services: Neuropsych  Discharge Criteria: Patient will be discharged from TR if patient refuses treatment 3 consecutive times without medical reason.  If treatment goals not met, if there is a change in medical status, if patient makes no progress towards goals or if patient is discharged from hospital.  The above assessment, treatment plan, treatment alternatives and goals were discussed and mutually agreed upon: by patient  Kennedy 05/17/2021, 12:44 PM

## 2021-05-17 NOTE — Progress Notes (Signed)
Speech Language Pathology Weekly Progress  Note  Patient Details  Name: Angela Carpenter MRN: [id number] Date of Birth: 10-27-1940  Beginning of progress report period: May 10, 2021 End of progress report period: May 17, 2021   Short Term Goals: Week 2: SLP Short Term Goal 1 (Week 2): Pt will complete medication and financial management for complex problem solving with min verbal cueing. SLP Short Term Goal 1 - Progress (Week 2): Met SLP Short Term Goal 2 (Week 2): Patient will demonstrate alternating attention between 2 functional tasks for 30 minutes with Min verbal cues for redirection. SLP Short Term Goal 2 - Progress (Week 2): Met SLP Short Term Goal 3 (Week 2): Pt will demonstrate use of word finding strategies to decrease anomia in structured language tasks given min verbal cueing. SLP Short Term Goal 3 - Progress (Week 2): Met SLP Short Term Goal 4 (Week 2): Patient will follow multi-step commands with Min verbal cues. SLP Short Term Goal 4 - Progress (Week 2): Met    New Short Term Goals: Week 3: SLP Short Term Goal 1 (Week 3): STGs=LTGs due to ELOS  Weekly Progress Updates: Patient has made excellent gains and has met 3 of 3 STGs this reporting period. Currently, patient is overall Mod I for word-finding at the conversation level without evidence of word-finding deficits. Patient requires overall Mod I-Supervision for attention and problem solving with functional and complex tasks but continues to demonstrate decreased short-term recall of functional information. Due to patient's progress, patient's plan of care has been changed to 1-3 times per week. Patient verbalized understanding and agreement. Patient would benefit from continued skilled SLP intervention to maximize her cognitive functioning and overall functional independence prior to discharge.      Intensity: Minumum of 1-2 x/day, 30 to 90 minutes Frequency: 1 to 3 out of 7 days Duration/Length of Stay:  05/25/21 Treatment/Interventions: Cognitive remediation/compensation;Environmental controls;Multimodal communication approach;Speech/Language facilitation;Cueing hierarchy;Functional tasks;Therapeutic Activities;Internal/external aids;Patient/family education     [name] 05/17/2021, 6:37 AM

## 2021-05-17 NOTE — Progress Notes (Signed)
PROGRESS NOTE   Subjective/Complaints:  In PT, ambulating better today with RLE wrapped in ACE  ROS: Denies CP, SOB, N/V/D  Objective:   No results found. No results for input(s): WBC, HGB, HCT, PLT in the last 72 hours.   No results for input(s): NA, K, CL, CO2, GLUCOSE, BUN, CREATININE, CALCIUM in the last 72 hours.    Intake/Output Summary (Last 24 hours) at 05/17/2021 0911 Last data filed at 05/17/2021 0729 Gross per 24 hour  Intake 714 ml  Output 1300 ml  Net -586 ml         Physical Exam: Vital Signs Blood pressure (!) 112/56, pulse 76, temperature 98.6 F (37 C), resp. rate 17, height '5\' 5"'$  (1.651 m), weight 70.3 kg, SpO2 98 %.  General: No acute distress Mood and affect are appropriate Heart: Regular rate and rhythm no rubs murmurs or extra sounds Lungs: Clear to auscultation, breathing unlabored, no rales or wheezes Abdomen: Positive bowel sounds, soft nontender to palpation, nondistended Extremities: No clubbing, cyanosis, or edema Skin: No evidence of breakdown, no evidence of rash, dried blood around crani incision site , no active bleeding or drainage  Motor: RUE: Proximally 0/5, hand grip 3+/5 RLE: Proximally 0/5, ankle dorsiflexion 1+/5  Assessment/Plan: 1. Functional deficits which require 3+ hours per day of interdisciplinary therapy in a comprehensive inpatient rehab setting. Physiatrist is providing close team supervision and 24 hour management of active medical problems listed below. Physiatrist and rehab team continue to assess barriers to discharge/monitor patient progress toward functional and medical goals  Care Tool:  Bathing    Body parts bathed by patient: Right arm, Chest, Abdomen, Right upper leg, Left upper leg, Face, Front perineal area, Buttocks, Right lower leg, Left lower leg   Body parts bathed by helper: Left arm, Buttocks, Front perineal area, Right lower leg, Left lower  leg Body parts n/a: Front perineal area   Bathing assist Assist Level: Minimal Assistance - Patient > 75%     Upper Body Dressing/Undressing Upper body dressing   What is the patient wearing?: Hospital gown only Orthosis activity level: Performed by helper  Upper body assist Assist Level: Moderate Assistance - Patient 50 - 74%    Lower Body Dressing/Undressing Lower body dressing      What is the patient wearing?: Incontinence brief     Lower body assist Assist for lower body dressing: Maximal Assistance - Patient 25 - 49%     Toileting Toileting    Toileting assist Assist for toileting: 2 Helpers     Transfers Chair/bed transfer  Transfers assist     Chair/bed transfer assist level: Minimal Assistance - Patient > 75% Chair/bed transfer assistive device: Sliding board   Locomotion Ambulation   Ambulation assist   Ambulation activity did not occur: Safety/medical concerns  Assist level: Moderate Assistance - Patient 50 - 74% Assistive device: Walker-rolling Max distance: 40 ft   Walk 10 feet activity   Assist  Walk 10 feet activity did not occur: Safety/medical concerns  Assist level: Moderate Assistance - Patient - 50 - 74% Assistive device: Walker-rolling   Walk 50 feet activity   Assist Walk 50 feet with 2 turns activity  did not occur: Safety/medical concerns  Assist level: Dependent - Patient 0% Assistive device: Lite Gait    Walk 150 feet activity   Assist Walk 150 feet activity did not occur: Safety/medical concerns  Assist level: Dependent - Patient 0% Assistive device: Lite Gait    Walk 10 feet on uneven surface  activity   Assist Walk 10 feet on uneven surfaces activity did not occur: Safety/medical concerns         Wheelchair     Assist Will patient use wheelchair at discharge?: Yes Type of Wheelchair: Manual    Wheelchair assist level: Supervision/Verbal cueing Max wheelchair distance: 100 ft    Wheelchair  50 feet with 2 turns activity    Assist        Assist Level: Supervision/Verbal cueing   Wheelchair 150 feet activity     Assist      Assist Level: Maximal Assistance - Patient 25 - 49%   Blood pressure (!) 112/56, pulse 76, temperature 98.6 F (37 C), resp. rate 17, height '5\' 5"'$  (1.651 m), weight 70.3 kg, SpO2 98 %.  Medical Problem List and Plan: 1.  R hemiplegia secondary to Metastatic disease from melanoma s/p L frontal crani 05/01/21  Continue CIR PT, OT, SLP 2.  Age indeterminate DVT in right popliteal vein, and in bilateral posterior tibial and peroneal veins. Chronic DVT in right common femoral and femoral veins. Given age indeterminate DVTs and history of ICH  Dr. Mohamed/NS  recommended prophylactic Lovenox- no IVC filter- resumption of Eliquis in a week or two--- plan to d/w NS wk of 8/15             -antiplatelet therapy: N/A 3. Pain Management: Tylenol prn.  4. Mood: LCSW to follow for evaluation and support.              -antipsychotic agents: N/A 5. Neuropsych: This patient is capable of making decisions on her own behalf. 6. Skin/Wound Care: Staples removed ok to shower 7. Fluids/Electrolytes/Nutrition: encourage PO  -intake excellent   -protein supp for albumin 8. Paroxymal A fib:/HTN: Monitor BP tid--continue metoprolol daily.             --Off Eliquis due to hemorrhage  HRControlled on/11 9. Glaucoma: Has been stable on timoptic--will resume.  10. Pre-diabetes: Hgb A1C-6.1. Diet liberalized to regular per patient/family request.             - monitor FBS weekly 11.  Leucocytosis:              --no fevers or other signs of infection.  WBCs 15.8 on 8/8  Cont to monitor 12. Hyponatremia: Sodium up to 135 on 8/8  Cont to monitor  13. Urinary retention:   -continue urecholine at 50 mg tid  -continue flomax at night. bp is soft but holding  -OOB to toilet/bsc to void  Improving But still requiring cath 1-2 x per day   14. Constipation:   - scheduled  miralax bid along with senna-s--BM x 2 yesterday   Improving   LOS: 14 days A FACE TO FACE EVALUATION WAS PERFORMED  Charlett Blake 05/17/2021, 9:11 AM

## 2021-05-17 NOTE — Plan of Care (Signed)
  Problem: RH Memory Goal: LTG Patient will demonstrate ability for day to day (SLP) Description: LTG:   Patient will demonstrate ability for day to day recall/carryover during cognitive/linguistic activities with assist  (SLP) Flowsheets (Taken 05/17/2021 0641) LTG: Patient will demonstrate ability for day to day recall: Daily complex information LTG: Patient will demonstrate ability for day to day recall/carryover during cognitive/linguistic activities with assist (SLP): Supervision Note: Goal Added Goal: LTG Patient will use memory compensatory aids to (SLP) Description: LTG:  Patient will use memory compensatory aids to recall biographical/new, daily complex information with cues (SLP) Flowsheets (Taken 05/17/2021 0641) LTG: Patient will use memory compensatory aids to (SLP): Supervision Note: Goal Added

## 2021-05-17 NOTE — Progress Notes (Signed)
Physical Therapy Session Note  Patient Details  Name: Angela Carpenter MRN: [id number] Date of Birth: 30-Oct-1940  Today's Date: 05/17/2021 PT Individual Time: 1030-1057 PT Individual Time Calculation (min): 27 min   Short Term Goals: Week 1:  PT Short Term Goal 1 (Week 1): Pt will perform squat pivot transfer to/from wc to L side w/mod assist of 1 PT Short Term Goal 1 - Progress (Week 1): Met PT Short Term Goal 2 (Week 1): pt will propel wc using hemi technique x 132f w/additional time and supervision PT Short Term Goal 2 - Progress (Week 1): Progressing toward goal PT Short Term Goal 3 (Week 1): pt will stand statically w/LUE support and min assist x 2 min LRAD/use of RLE blocking/bracing as needed PT Short Term Goal 3 - Progress (Week 1): Met PT Short Term Goal 4 (Week 1): supine to side to sit w/cga only PT Short Term Goal 4 - Progress (Week 1): Progressing toward goal Week 2:  PT Short Term Goal 1 (Week 2): supine to side to sit w/cga only PT Short Term Goal 2 (Week 2): pt will propel wc using hemi technique x 1564fw/additional time and supervision PT Short Term Goal 3 (Week 2): Patient will perform basic transfers with min A consistently. PT Short Term Goal 4 (Week 2): Patient will ambulate >75 feet with mod A using LRAD.  Skilled Therapeutic Interventions/Progress Updates:   [facility]; recreational therapy present during session. Pt agreeable to PT treatment and denied any pain during session but reported feeling fatigued from not sleeping well last night. Session with emphasis on functional mobility/transfers, dynamic standing balance/coordination, NMR and functional use of RUE. [facility] and transferred sit<>stand at table in dayroom with RUE supported around therapist and mod A with cues for anterior weight shifting as pt tends to push posteriorly. Worked on dynamic standing  balance, weight shifting, and functional use of RUE with emphasis on R shoulder flexion/extension pushing/pulling yoga block on towel (to decrease friction) x 5 reps. Pt with difficulty moving block to R with tendency to veer towards L. Attempted "windshield wipers" L/R with block but pt unable to perform. Pt stood for ~8 minutes with min A fading to CGA with very minimal assist to block R knee. During rest breaks and transportation time to/from gym recreational therapist discussed hobbies with pt and strategies to modify certain tasks that she would like to get back to doing and well as stress management techniques. Concluded session with pt sitting in WC, needs within reach, and seatbelt alarm on. NT present to perform bladder scan.   Therapy Documentation Precautions:  Precautions Precautions: Fall Precaution Comments: R hemiparesis Required Braces or Orthoses: Sling Restrictions Weight Bearing Restrictions: No RLE Weight Bearing: Weight bearing as tolerated  Therapy/Group: Individual Therapy AnAlfonse AlpersT, DPT   05/17/2021, 7:18 AM

## 2021-05-17 NOTE — Progress Notes (Signed)
sPhysical Therapy Session Note  Patient Details  Name: Angela Carpenter MRN: [id number] Date of Birth: Oct 09, 1940  Today's Date: 05/17/2021 PT Individual Time: 1194-1740 PT Individual Time Calculation (min): 60 min   Short Term Goals: Week 1:  PT Short Term Goal 1 (Week 1): Pt will perform squat pivot transfer to/from wc to L side w/mod assist of 1 PT Short Term Goal 1 - Progress (Week 1): Met PT Short Term Goal 2 (Week 1): pt will propel wc using hemi technique x 157f w/additional time and supervision PT Short Term Goal 2 - Progress (Week 1): Progressing toward goal PT Short Term Goal 3 (Week 1): pt will stand statically w/LUE support and min assist x 2 min LRAD/use of RLE blocking/bracing as needed PT Short Term Goal 3 - Progress (Week 1): Met PT Short Term Goal 4 (Week 1): supine to side to sit w/cga only PT Short Term Goal 4 - Progress (Week 1): Progressing toward goal Week 2:  PT Short Term Goal 1 (Week 2): supine to side to sit w/cga only PT Short Term Goal 2 (Week 2): pt will propel wc using hemi technique x 1560fw/additional time and supervision PT Short Term Goal 3 (Week 2): Patient will perform basic transfers with min A consistently. PT Short Term Goal 4 (Week 2): Patient will ambulate >75 feet with mod A using LRAD. Week 3:     Skilled Therapeutic Interventions/Progress Updates:    Pain:  Pt reports no pain.  Treatment to tolerance.  Rest breaks and repositioning as needed. Brother in law present and states he will be observing session.  Educated on Covid restrictions, session performed bedside and in hallway to allow his observation.  Pt initially supine and agreeable to treatment session w/focus on functional mobility. Therapist donned Teds w/pt supine/total assist for time management. Supine to sit w/min assist, mod assist for sequencing/cuing, advancement of R hip in sitting. Sit to stand w/max cues for ant wt shift, momentum, mod assist for transition. Initially  moderate post tendencyR, mild retraction of r hip/shoulder, mild r knee flexion. Worded on midline orientation, wt shifting, extension of r knee w/cues to "shift weight to balls of feet".  After about 2 min pt able to stand w/cga and engage in visual tracking/slowly w/cervical nods and turns/slow, cga occasional balance loss post R w/delayed reactions, corrects w/min assist and verbal/tactile cues. Pt w/increased activation of quads/hams/hip add/hip flexion today. stand pivot transfer to wc w/mod assist.  Pt transported to hallway.   Gait trials as follows w/RDF acewrap assist:   3516f/min handhold assist of 2, able to initiate short step R initially w/moderate assist for wt shfting, tactile and verbal cues for advancement, guarding of R knee and tactile cues for quad/glut activtation, posture, sequencing.  [id number] person fading to one person mod to min assist/HHA on L, cues for step length, wt shitfing, posture.  45 ft w/HHA of 1, brief trail w/no HHA but results in decreased step length bilat and decreased L wt shift w/swing phase R, decreased stability stance R.  Unable to achieve heelstrike or full knee extension at terminal swing but advances limb 90% time without assist.  Cues to increase step length L to allow trailing limb R does improve swing phase R.  No buckling R knee w/stance but never fully extends.  R trendelenberg but moderate.  At end of session, pt transported back to bedside.  Pt left oob in wc w/alarm belt set and needs in reach  Therapy Documentation Precautions:  Precautions Precautions: Fall Precaution Comments: R hemiparesis Required Braces or Orthoses: Sling Restrictions Weight Bearing Restrictions: No RLE Weight Bearing: Weight bearing as tolerated    Therapy/Group: Individual Therapy Callie Fielding, [place] 05/17/2021, 12:52 PM

## 2021-05-17 NOTE — Progress Notes (Signed)
Occupational Therapy Session Note  Patient Details  Name: Angela Carpenter MRN: [id number] Date of Birth: 12-20-1940  Today's Date: 05/17/2021 OT Individual Time: 5953-9672 OT Individual Time Calculation (min): 55 min    Short Term Goals: Week 1:  OT Short Term Goal 1 (Week 1): Pt will don shirt wiht MOD A OT Short Term Goal 1 - Progress (Week 1): Met OT Short Term Goal 2 (Week 1): Pt will recall hemi strategies wiht MIN VC OT Short Term Goal 2 - Progress (Week 1): Met OT Short Term Goal 3 (Week 1): Pt will thread 1LE into pants OT Short Term Goal 3 - Progress (Week 1): Met OT Short Term Goal 4 (Week 1): Pt will groom in unsupported sitting wiht MIN A OT Short Term Goal 4 - Progress (Week 1): Met Week 2:  OT Short Term Goal 1 (Week 2): Pt will complete UB dressing with MIN A OT Short Term Goal 1 - Progress (Week 2): Met OT Short Term Goal 2 (Week 2): Pt will thread BLE into pants with A for LE positioning only PRN OT Short Term Goal 2 - Progress (Week 2): Progressing toward goal OT Short Term Goal 3 (Week 2): Pt will transfer consistently to toilet with MOD A and LRAD OT Short Term Goal 3 - Progress (Week 2): Met OT Short Term Goal 4 (Week 2): Pt will recall adapative bathing strategies with MIN cuing and supervision OT Short Term Goal 4 - Progress (Week 2): Met Week 3:  OT Short Term Goal 1 (Week 3): STG= LTG d/t ELOS  Skilled Therapeutic Interventions/Progress Updates:    Pt greeted at time of session sitting up wheelchair agreeable to OT session, wanting to wash her hair as staples have now been removed and per team pt now okay to wash hair. Therapist assisted with hair washing tasks at sink level with board, pt helping with drying w/ both towel and hairdryer with Mod/Max facilitation of RUE to hold towel on head and assist with drying, some activation noted with familiar movement. Transported to gym dependent for time and focused on standing balance/body awareness with mirror for  feedback, sit <> stand Max A initially fading to Mod/Min with repetition and R knee block, cues for anterior weight shift, and facilitation of weight bearing through RUE on armrest. Pt able to perform mild lateral and anterior weight shifts in standing with decreasing need for support in standing, fading to CGA/Min in standing. Transported back to room set up alarm on call bell in reach.   Therapy Documentation Precautions:  Precautions Precautions: Fall Precaution Comments: R hemiparesis Required Braces or Orthoses: Sling Restrictions Weight Bearing Restrictions: No RLE Weight Bearing: Weight bearing as tolerated    Therapy/Group: Individual Therapy  [name] 05/17/2021, 1:00 PM

## 2021-05-17 NOTE — Progress Notes (Signed)
Occupational Therapy Session Note  Patient Details  Name: Angela Carpenter MRN: [id number] Date of Birth: 1940/11/05  Today's Date: 05/17/2021 OT Individual Time: 2182-8833 OT Individual Time Calculation (min): 60 min   Short Term Goals: Week 3:  OT Short Term Goal 1 (Week 3): STG= LTG d/t ELOS  Skilled Therapeutic Interventions/Progress Updates:    Pt greeted seated in wc asleep, easy to wake and agreeable to OT treatment session. Pt brought down to therapy gym and working on R UE NMR with weight bearing towel pushes in standing. Mod A To get to standing and assistance to get R LE positioned correctly underneath her. Pt with some flexor pattern noted throughout R UE. Pt developing grasp and worked on cup stacking task with OT assist to bend elbow and lift up, but then pt able to maintain gasp on cup and pull across table. OT facilitation for normal movement patterns. Pt completed 5 mins forward and 5 mins back on [employer] arm bike. OT used ACE wrap to keep R UE positioned on handle. Pt retruned to room and left seated in wc with alarm belt on, call bel in reach and needs met.  Therapy Documentation Precautions:  Precautions Precautions: Fall Precaution Comments: R hemiparesis Required Braces or Orthoses: Sling Restrictions Weight Bearing Restrictions: No RLE Weight Bearing: Weight bearing as tolerated Pain:  Denies pain  Therapy/Group: Individual Therapy  Valma Cava 05/17/2021, 3:11 PM

## 2021-05-18 ENCOUNTER — Telehealth: Payer: Self-pay | Admitting: Oncology

## 2021-05-18 NOTE — Progress Notes (Signed)
Occupational Therapy Session Note  Patient Details  Name: Angela Carpenter MRN: [id number] Date of Birth: 11-Jun-1941  Today's Date: 05/18/2021 OT Individual Time: 6283-6629 OT Individual Time Calculation (min): 55 min    Short Term Goals: Week 3:  OT Short Term Goal 1 (Week 3): STG= LTG d/t ELOS  Skilled Therapeutic Interventions/Progress Updates:    Pt received supine with no c/o pain, her SIL Marlowe Kays present. Worked on LB dressing to start using reacher. Mod cueing for use and strategies to increase independence. Pt was very successful in use and required no assist to thread pants over BLE. Pt required cueing throughout session for RUE use and to not provide total A from the LUE. Mod A sit > stand and then pt was able to fully pull up pants on her own! She short distance functional mobility to the w/c, ~7 ft, with only CGA!! In the therapy gym pt worked with the shoulder ranger to reduce gravity demands to work on shoulder flexion/extension, and elbow flexion/ext. Pt returned to her room and was left sitting up in the w/c with all needs met.    Saebo Stim One placed on the anterior and middle deltoid for joint approximation and muscle activation- for 60 min unattended. No adverse skin reactions or c/o pain.  330 pulse width 35 Hz pulse rate On 8 sec/ off 8 sec Ramp up/ down 2 sec Symmetrical Biphasic wave form  Max intensity 156m at 500 Ohm load  Therapy Documentation Precautions:  Precautions Precautions: Fall Precaution Comments: R hemiparesis Required Braces or Orthoses: Sling Restrictions Weight Bearing Restrictions: No RLE Weight Bearing: Weight bearing as tolerated   Therapy/Group: Individual Therapy  SCurtis Sites8/09/2021, 6:39 AM

## 2021-05-18 NOTE — Progress Notes (Signed)
PROGRESS NOTE   Subjective/Complaints:  Up with OT. No new issues today. OT noticed a blister on dorsum of left foot. Bowels moving ok. Noticing return on right side.  ROS: Patient denies fever, rash, sore throat, blurred vision, nausea, vomiting, diarrhea, cough, shortness of breath or chest pain, joint or back pain, headache, or mood change.     Objective:   No results found. No results for input(s): WBC, HGB, HCT, PLT in the last 72 hours.   No results for input(s): NA, K, CL, CO2, GLUCOSE, BUN, CREATININE, CALCIUM in the last 72 hours.    Intake/Output Summary (Last 24 hours) at 05/18/2021 1110 Last data filed at 05/18/2021 0339 Gross per 24 hour  Intake 440 ml  Output 1072 ml  Net -632 ml        Physical Exam: Vital Signs Blood pressure (!) 105/55, pulse 67, temperature 97.9 F (36.6 C), temperature source Oral, resp. rate 17, height '5\' 5"'$  (1.651 m), weight 70.3 kg, SpO2 98 %.  Constitutional: No distress . Vital signs reviewed. HEENT: NCAT, EOMI, oral membranes moist Neck: supple Cardiovascular: RRR without murmur. No JVD    Respiratory/Chest: CTA Bilaterally without wheezes or rales. Normal effort    GI/Abdomen: BS +, non-tender, non-distended Ext: no clubbing, cyanosis, 1+ LE edema Psych: pleasant and cooperative  Skin: No evidence of breakdown, no evidence of rash, dried blood around crani incision site diminished, 2cm+ blister filled with yellow fluid on dorsum of left foot. No irritation or signs of pressure.   Motor: RUE: Proximally tr/5, hand grip 3+/5--stable RLE: Proximally 0/5, ankle dorsiflexion 1+/5--stable  Assessment/Plan: 1. Functional deficits which require 3+ hours per day of interdisciplinary therapy in a comprehensive inpatient rehab setting. Physiatrist is providing close team supervision and 24 hour management of active medical problems listed below. Physiatrist and rehab team  continue to assess barriers to discharge/monitor patient progress toward functional and medical goals  Care Tool:  Bathing    Body parts bathed by patient: Right arm, Chest, Abdomen, Right upper leg, Left upper leg, Face, Front perineal area, Buttocks, Right lower leg, Left lower leg   Body parts bathed by helper: Left arm, Buttocks, Front perineal area, Right lower leg, Left lower leg Body parts n/a: Front perineal area   Bathing assist Assist Level: Minimal Assistance - Patient > 75%     Upper Body Dressing/Undressing Upper body dressing   What is the patient wearing?: Hospital gown only Orthosis activity level: Performed by helper  Upper body assist Assist Level: Moderate Assistance - Patient 50 - 74%    Lower Body Dressing/Undressing Lower body dressing      What is the patient wearing?: Incontinence brief     Lower body assist Assist for lower body dressing: Maximal Assistance - Patient 25 - 49%     Toileting Toileting    Toileting assist Assist for toileting: 2 Helpers     Transfers Chair/bed transfer  Transfers assist     Chair/bed transfer assist level: Minimal Assistance - Patient > 75% Chair/bed transfer assistive device: Sliding board   Locomotion Ambulation   Ambulation assist   Ambulation activity did not occur: Safety/medical concerns  Assist level: Minimal  Assistance - Patient > 75% Assistive device: Hand held assist Max distance: 40   Walk 10 feet activity   Assist  Walk 10 feet activity did not occur: Safety/medical concerns  Assist level: Minimal Assistance - Patient > 75% Assistive device: Hand held assist   Walk 50 feet activity   Assist Walk 50 feet with 2 turns activity did not occur: Safety/medical concerns  Assist level: Dependent - Patient 0% Assistive device: Lite Gait    Walk 150 feet activity   Assist Walk 150 feet activity did not occur: Safety/medical concerns  Assist level: Dependent - Patient 0% Assistive  device: Lite Gait    Walk 10 feet on uneven surface  activity   Assist Walk 10 feet on uneven surfaces activity did not occur: Safety/medical concerns         Wheelchair     Assist Will patient use wheelchair at discharge?: Yes Type of Wheelchair: Manual    Wheelchair assist level: Supervision/Verbal cueing Max wheelchair distance: 100 ft    Wheelchair 50 feet with 2 turns activity    Assist        Assist Level: Supervision/Verbal cueing   Wheelchair 150 feet activity     Assist      Assist Level: Maximal Assistance - Patient 25 - 49%   Blood pressure (!) 105/55, pulse 67, temperature 97.9 F (36.6 C), temperature source Oral, resp. rate 17, height '5\' 5"'$  (1.651 m), weight 70.3 kg, SpO2 98 %.  Medical Problem List and Plan: 1.  R hemiplegia secondary to Metastatic disease from melanoma s/p L frontal crani 05/01/21  -Continue CIR therapies including PT, OT, and SLP  2.  Age indeterminate DVT in right popliteal vein, and in bilateral posterior tibial and peroneal veins. Chronic DVT in right common femoral and femoral veins. Given age indeterminate DVTs and history of ICH  Dr. Mohamed/NS  recommended prophylactic Lovenox- no IVC filter- resumption of Eliquis in a week or two--- plan to d/w NS wk of 8/15             -antiplatelet therapy: N/A 3. Pain Management: Tylenol prn.  4. Mood: LCSW to follow for evaluation and support.              -antipsychotic agents: N/A 5. Neuropsych: This patient is capable of making decisions on her own behalf. 6. Skin/Wound Care: Staples removed    8/12 -foam dressing to foot--edema related? No signs of pressure. Eating well.      -don't feel compelled to treat edema either--check weight today, EF 55-60% last yr 7. Fluids/Electrolytes/Nutrition: encourage PO  -intake excellent   -protein supp for albumin 8. Paroxymal A fib:/HTN: Monitor BP tid--continue metoprolol daily.             --Off Eliquis due to hemorrhage  HR  Controlled on 8/12 9. Glaucoma: Has been stable on timoptic--will resume.  10. Pre-diabetes: Hgb A1C-6.1. Diet liberalized to regular per patient/family request.             - monitor FBS weekly 11.  Leucocytosis:              --no fevers or other signs of infection.  WBCs 15.8 on 8/8  Cont to monitor 12. Hyponatremia: Sodium up to 135 on 8/8  Cont to monitor  13. Urinary retention:   -continue urecholine at 50 mg tid  -continue flomax at night. bp is soft but holding  -OOB to toilet/bsc to void  Improved But still requiring cath 1x  per day   14. Constipation:   - scheduled miralax bid along with senna-s--BM x 2 yesterday   Improving--pt doesn't wish to adjust today   LOS: 15 days A FACE TO FACE EVALUATION WAS PERFORMED  Angela Carpenter 05/18/2021, 11:10 AM

## 2021-05-18 NOTE — Progress Notes (Signed)
Occupational Therapy Session Note  Patient Details  Name: Angela Carpenter MRN: [id number] Date of Birth: 06-01-1941  Today's Date: 05/18/2021 OT Individual Time: 1130-1153 OT Individual Time Calculation (min): 23 min    Short Term Goals: Week 3:  OT Short Term Goal 1 (Week 3): STG= LTG d/t ELOS  Skilled Therapeutic Interventions/Progress Updates:    Pt received entering stedy with NT to go to bathroom, no c/o pain, agreeable to work with therapist to toilet. Session focus on self-care retraining, activity tolerance, func t/fs in prep for improved ADL/IADL/func mobility performance + decreased caregiver burden. Min A to power up in stedy from lower w/c height. Close S for LB clothing clothing management. Extended time for attempt to void bladder, only continent void of bowel. Total A for posterior pericare in standing. Stedy > bed per NT request for bladder scan. Returned to supine with mod A to progress BLE onto bed. Removed Saebo stim from L shoulder, denies pain/ no skin irritation noted. Therapeutic positioning of hemiparetic RUE in bed.  Pt left supine in bed with NT present, call bell in reach, and all immediate needs met.    Therapy Documentation Precautions:  Precautions Precautions: Fall Precaution Comments: R hemiparesis Required Braces or Orthoses: Sling Restrictions Weight Bearing Restrictions: No RLE Weight Bearing: Weight bearing as tolerated  Pain: no c/o   ADL: See Care Tool for more details.  Therapy/Group: Individual Therapy  Volanda Napoleon MS, OTR/L   05/18/2021, 6:51 AM

## 2021-05-18 NOTE — Telephone Encounter (Signed)
Sch per 7/21 sch msg, left message

## 2021-05-18 NOTE — Progress Notes (Signed)
Physical Therapy Session Note  Patient Details  Name: Angela Carpenter MRN: [id number] Date of Birth: [birth year]  Today's Date: 05/18/2021 PT Individual Time: 1000-1100 PT Individual Time Calculation (min): 60 min   Short Term Goals: Week 2:  PT Short Term Goal 1 (Week 2): supine to side to sit w/cga only PT Short Term Goal 2 (Week 2): pt will propel wc using hemi technique x 121f w/additional time and supervision PT Short Term Goal 3 (Week 2): Patient will perform basic transfers with min A consistently. PT Short Term Goal 4 (Week 2): Patient will ambulate >75 feet with mod A using LRAD.  Skilled Therapeutic Interventions/Progress Updates: Pt presents sitting in w/c and agreeable to therapy.  Pt wheeled to gym for time and energy conservation.  Ace wrap applied to R foot to improve dorsiflexion.  Friend present for session.  Pt transfers sit to stand w/ mod A and blocking of R knee to maintain neutral rotation, but improved WB today.  Pt amb w/ HHA x 2 for 1st trial w/ min A for each, facilitation for weight-shift to left to advance RLE.  Pt amb approx. 480 including turn but noted decrease in advancement of RLE 2/2 fatigue.  Pt regresses to increased ER of hip and circumduction to advance.  Pt require verbal cues for posture.  Pt amb x 2 trials w/ min A and 1 HHA.  Pt requires seated rest breaks between trials.  Pt practiced sit to stand transfers w/ mod to max A and increased forward lean.  Pt stood at hi-lo table reaching forward and to R , facilitation at R hand and elbow for weight-bearing , verbal cues for R knee extension.  Pt performing peg board design.  Pt returned to room w/ Rehab Tech and remained in w/c w/ seat alarm on, all needs in reach.     Therapy Documentation Precautions:  Precautions Precautions: Fall Precaution Comments: R hemiparesis Required Braces or Orthoses: Sling Restrictions Weight Bearing Restrictions: No RLE Weight Bearing: Weight bearing as  tolerated General:   Vital Signs:  Pain:0/10 Pain Assessment Pain Scale: 0-10 Pain Score: 0-No pain Mobility:       Therapy/Group: Individual Therapy  JLadoris Gene8/09/2021, 11:58 AM

## 2021-05-18 NOTE — Progress Notes (Signed)
Physical Therapy Session Note  Patient Details  Name: Angela Carpenter MRN: [id number] Date of Birth: June 17, 1941  Today's Date: 05/18/2021 PT Individual Time: 1410-1500 PT Individual Time Calculation (min): 50 min   Short Term Goals: Week 2:  PT Short Term Goal 1 (Week 2): supine to side to sit w/cga only PT Short Term Goal 2 (Week 2): pt will propel wc using hemi technique x 138f w/additional time and supervision PT Short Term Goal 3 (Week 2): Patient will perform basic transfers with min A consistently. PT Short Term Goal 4 (Week 2): Patient will ambulate >75 feet with mod A using LRAD.  Skilled Therapeutic Interventions/Progress Updates:  Patient supine in bed on entrance to room. Patient alert and agreeable to PT session. Patient denied pain during session. Pt not ready for session d/t recent scan from nursing. Requires donning of TED hose, AFO and shoes with Max A prior to start of treatment.   Therapeutic Activity: Bed Mobility: Pt performed supine --> sit with Mod A with max cueing for using abdominals and trunk strength to rotate R side around toward EOB.  Transfers: Patient performed lateral scoot along EOB toward w/c with Mod A and extensive cues for forward lean over therapist. Squat pivot transfer to R side bed--> w/c with Mod A and reaches in one effort. Provided verbal cues throughout for technique.  Gait Training:  Patient ambulated 100 feet with AFO donned using RW with Mod A for RLE advancement and positioning+2 for w/c follow. Demonstrated ability to initiate swing throughout but pt states that AFO and shoe are very heavy and prevent pt from advancing foot. Mod A provided for hip advancement, forward rotation of R trunk, and completion of advancement and positioning of R foot. Provided vc/ tc throughout for sequencing weight shift with swing through, increasing knee flexion for increased step height and floor clearance.  Patient fatigued and seated upright  in w/c at end of  session with brakes locked, belt alarm set, and all needs within reach.     Therapy Documentation Precautions:  Precautions Precautions: Fall Precaution Comments: R hemiparesis Required Braces or Orthoses: Sling Restrictions Weight Bearing Restrictions: No RLE Weight Bearing: Weight bearing as tolerated  Therapy/Group: Individual Therapy  JAlger SimonsPT, DPT 05/18/2021, 6:52 PM

## 2021-05-18 NOTE — Progress Notes (Signed)
Speech Language Pathology Daily Session Note  Patient Details  Name: Angela Carpenter MRN: [id number] Date of Birth: 02/25/1941  Today's Date: 05/18/2021 SLP Individual Time: [id number] SLP Individual Time Calculation (min): 30 min  Short Term Goals: Week 3: SLP Short Term Goal 1 (Week 3): STGs=LTGs due to ELOS  Skilled Therapeutic Interventions:   Patient seen to address skilled ST session focused on cognitive function goals. Patient sitting in wheelchair with family member in room. She completed mildly complex scheduling puzzle task with SLP acting as scribe per patient's request (she is right handed and only has use of her left hand). Patient able to complete this task with mild delay but with patient requiring supervision level assistance to complete with 100% accuracy. Patient had one instance of communication difficulty during which she was unable to think of name of a computer program she used to use. She was able to describe it to SLP accurately however. When having this word finding difficulty, patient did react as if it was not something that normally happened. She continues to benefit from skilled SLP intervention to maximize cognitive function goals prior to discharge.  Pain Pain Assessment Pain Scale: 0-10 Pain Score: 0-No pain  Therapy/Group: Individual Therapy  Sonia Baller, MA, CCC-SLP Speech Therapy

## 2021-05-20 DIAGNOSIS — I10 Essential (primary) hypertension: Secondary | ICD-10-CM

## 2021-05-20 DIAGNOSIS — D72829 Elevated white blood cell count, unspecified: Secondary | ICD-10-CM

## 2021-05-20 NOTE — Progress Notes (Signed)
PROGRESS NOTE   Subjective/Complaints: Patient seen laying in bed this morning.  She states she slept well overnight except for having to go to the bathroom.  She denies complaints.  ROS: Denies CP, SOB, N/V/D  Objective:   No results found. No results for input(s): WBC, HGB, HCT, PLT in the last 72 hours.   No results for input(s): NA, K, CL, CO2, GLUCOSE, BUN, CREATININE, CALCIUM in the last 72 hours.    Intake/Output Summary (Last 24 hours) at 05/20/2021 0926 Last data filed at 05/20/2021 0815 Gross per 24 hour  Intake 660 ml  Output --  Net 660 ml         Physical Exam: Vital Signs Blood pressure (!) 105/43, pulse 74, temperature 97.9 F (36.6 C), temperature source Oral, resp. rate 18, height '5\' 5"'$  (1.651 m), weight 70.3 kg, SpO2 100 %. Constitutional: No distress . Vital signs reviewed. HENT: Normocephalic.  Atraumatic. Eyes: EOMI. No discharge. Cardiovascular: No JVD.  RRR. Respiratory: Normal effort.  No stridor.  Bilateral clear to auscultation. GI: Non-distended.  BS +. Skin: Warm and dry.  Intact. Psych: Normal mood.  Normal behavior. Musc: No tenderness in bilateral extremities. Neuro: Alert Motor: RUE: Proximally tr/5, hand grip 3+/5--stable RLE: Proximally 0/5, ankle dorsiflexion 1+/5, unchanged  Assessment/Plan: 1. Functional deficits which require 3+ hours per day of interdisciplinary therapy in a comprehensive inpatient rehab setting. Physiatrist is providing close team supervision and 24 hour management of active medical problems listed below. Physiatrist and rehab team continue to assess barriers to discharge/monitor patient progress toward functional and medical goals  Care Tool:  Bathing    Body parts bathed by patient: Right arm, Chest, Abdomen, Right upper leg, Left upper leg, Face, Front perineal area, Buttocks, Right lower leg, Left lower leg   Body parts bathed by helper: Left arm,  Buttocks, Front perineal area, Right lower leg, Left lower leg Body parts n/a: Front perineal area   Bathing assist Assist Level: Minimal Assistance - Patient > 75%     Upper Body Dressing/Undressing Upper body dressing   What is the patient wearing?: Hospital gown only Orthosis activity level: Performed by helper  Upper body assist Assist Level: Moderate Assistance - Patient 50 - 74%    Lower Body Dressing/Undressing Lower body dressing      What is the patient wearing?: Incontinence brief     Lower body assist Assist for lower body dressing: Maximal Assistance - Patient 25 - 49%     Toileting Toileting    Toileting assist Assist for toileting: 2 Helpers     Transfers Chair/bed transfer  Transfers assist     Chair/bed transfer assist level: Minimal Assistance - Patient > 75% Chair/bed transfer assistive device: Sliding board   Locomotion Ambulation   Ambulation assist   Ambulation activity did not occur: Safety/medical concerns  Assist level: Minimal Assistance - Patient > 75% Assistive device: Hand held assist Max distance: 40   Walk 10 feet activity   Assist  Walk 10 feet activity did not occur: Safety/medical concerns  Assist level: Minimal Assistance - Patient > 75% Assistive device: Hand held assist   Walk 50 feet activity   Assist Walk  50 feet with 2 turns activity did not occur: Safety/medical concerns  Assist level: Dependent - Patient 0% Assistive device: Lite Gait    Walk 150 feet activity   Assist Walk 150 feet activity did not occur: Safety/medical concerns  Assist level: Dependent - Patient 0% Assistive device: Lite Gait    Walk 10 feet on uneven surface  activity   Assist Walk 10 feet on uneven surfaces activity did not occur: Safety/medical concerns         Wheelchair     Assist Will patient use wheelchair at discharge?: Yes Type of Wheelchair: Manual    Wheelchair assist level: Supervision/Verbal  cueing Max wheelchair distance: 100 ft    Wheelchair 50 feet with 2 turns activity    Assist        Assist Level: Supervision/Verbal cueing   Wheelchair 150 feet activity     Assist      Assist Level: Maximal Assistance - Patient 25 - 49%   Blood pressure (!) 105/43, pulse 74, temperature 97.9 F (36.6 C), temperature source Oral, resp. rate 18, height '5\' 5"'$  (1.651 m), weight 70.3 kg, SpO2 100 %.  Medical Problem List and Plan: 1.  R hemiplegia secondary to Metastatic disease from melanoma s/p L frontal crani 05/01/21  Continue CIR 2.  Age indeterminate DVT in right popliteal vein, and in bilateral posterior tibial and peroneal veins. Chronic DVT in right common femoral and femoral veins. Given age indeterminate DVTs and history of ICH  Dr. Mohamed/NS  recommended prophylactic Lovenox- no IVC filter- resumption of Eliquis in a week or two--- plan to d/w NS this week             -antiplatelet therapy: N/A 3. Pain Management: Tylenol prn.  4. Mood: LCSW to follow for evaluation and support.              -antipsychotic agents: N/A 5. Neuropsych: This patient is capable of making decisions on her own behalf. 6. Skin/Wound Care: Staples removed    8/12 -foam dressing to foot--edema related? No signs of pressure.   7. Fluids/Electrolytes/Nutrition: encourage PO  -intake excellent   -protein supp for albumin 8. Paroxymal A fib:/HTN: Monitor BP tid--continue metoprolol daily.             --Off Eliquis due to hemorrhage  HR Controlled on 8/14 9. Glaucoma: Has been stable on timoptic--will resume.  10. Pre-diabetes: Hgb A1C-6.1. Diet liberalized to regular per patient/family request.             - monitor FBS weekly 11.  Leucocytosis:              --no fevers or other signs of infection.  WBCs 15.8 on 8/8, labs ordered for tomorrow  Cont to monitor 12. Hyponatremia: Sodium up to 135 on 8/8  Cont to monitor  13. Urinary retention:   -continue urecholine at 50 mg  tid  -continue flomax at night. bp is soft but holding  -OOB to toilet/bsc to void  Improving   14. Constipation:   - scheduled miralax bid along with senna-s  Improving  LOS: 17 days A FACE TO FACE EVALUATION WAS PERFORMED  Angela Carpenter Lorie Phenix 05/20/2021, 9:26 AM

## 2021-05-20 NOTE — Progress Notes (Addendum)
Occupational Therapy Session Note  Patient Details  Name: Angela Carpenter MRN: [id number] Date of Birth: 1941-04-05  Today's Date: 05/20/2021 OT Individual Time: 1035-1150 OT Individual Time Calculation (min): 75 min   Session 2: OT Individual Time: 1235-1250 OT Individual Time Calculation (min): 15 min    Short Term Goals: Week 3:  OT Short Term Goal 1 (Week 3): STG= LTG d/t ELOS  Skilled Therapeutic Interventions/Progress Updates:    Pt in w/c with no c/o pain, agreeable to shower level ADLs. Pt completed stand pivot transfer with the RW with mod A to power up and then CGA once standing to transfer into walk in shower onto TTB. Min cueing still required for body mechanics when standing and hand placement. UB bathing with min facilitation of the RUE to reach under the LUE. Trialed lateral leans- min A required for thoroughness and to support the RUE weightbearing on the bench. Pt stood with min A with use of the grab bar. Stand pivot back to w/c with min A. Pt donned shirt with min A to pull overhead and min cueing for hemi technique. Reacher used to don underwear with min A and min cueing. Upon standing to pull up underwear pt reported need to urinate. Min A transfer to the [facility]. Updated safety plan to reflect improvement of pt to stand pivot with nursing staff and discontinue use of the stedy. Pt unable to void. She completed stand pivot transfer back to the w/c with min A. Pt's son arrived and edu was provided re pt CLOF and use of saebo at home- which they are interested in purchasing. Passed along info re home bed measurement to PT for practice in later session. Pt was left sitting up in the w/c with all needs met.   Saebo Stim One applied to pt's RUE to assist with joint approximation and muscle activation for 60 min of unattended e-stim. No adverse skin reactions or pain reported.  330 pulse width 35 Hz pulse rate On 8 sec/ off 8 sec Ramp up/ down 2 sec Symmetrical Biphasic wave form   Max intensity 120m at 500 Ohm load   Session 2: Re-entered room with pt and son present. Lengthy discussion with them both and then son SNicki Reaperprivately re bathroom transfers with nursing, as well as general d/c planning and recommendations. Pt had a less successful session with PT and several factors are indicating anxiety may be a role in transfers with different staff members and often success in voiding. Followed up with NT to ensure carryover of transfer technique for nighttime use.  Therapy Documentation Precautions:  Precautions Precautions: Fall Precaution Comments: R hemiparesis Required Braces or Orthoses: Sling Restrictions Weight Bearing Restrictions: No RLE Weight Bearing: Weight bearing as tolerated  Therapy/Group: Individual Therapy  SCurtis Sites8/14/2022, 7:15 AM

## 2021-05-20 NOTE — Progress Notes (Signed)
Physical Therapy Session Note  Patient Details  Name: Angela Carpenter MRN: [id number] Date of Birth: 1940/12/22  Today's Date: 05/20/2021 PT Individual Time: 1302-1402 PT Individual Time Calculation (min): 60 min   Short Term Goals: Week 2:  PT Short Term Goal 1 (Week 2): supine to side to sit w/cga only PT Short Term Goal 2 (Week 2): pt will propel wc using hemi technique x 16f w/additional time and supervision PT Short Term Goal 3 (Week 2): Patient will perform basic transfers with min A consistently. PT Short Term Goal 4 (Week 2): Patient will ambulate >75 feet with mod A using LRAD.   Skilled Therapeutic Interventions/Progress Updates:    Session focused on family education initiated with son, SNicki Reaperwho was present. Reports bed height was measured and is 26.5" and goal of session to practice transfers for this to determine what they need to do to adjust bed and set up at home. Reports they have several items through friends equipment wise available. Pt with increased edema in BLE reportedly greater than usual. Pt with Teds donned and LE"s slightly elevated but not enough from positioning in w/c. Unable to don R shoe with AFO brace due to edema (L shoe was tight without AFO) so had to do the session without the AFO. Pt unsure of if she will even go home with one per her report due to she doesn't think it makes a difference. Will need to follow up with primary. Educated on purpose of AFO with pt and son. Transported to apartment to simulate bed <> w/c transfers via regular bed (this bed is 28") but pt reports this mattress is much softer than hers. Requires mod to max assist and several attempts with various techniques and positioning/facilitation for sit <> stand due to strong posterior and R lean. Worked on in static standing just coming forward to find balance and pt with difficulty with this today. Pt able to verbalize and aware that she was leaning but unable to correct even with  facilitation. Required max assist to complete pivot to the bed. Engaged in bed mobility retraining requiring mod assist overall due to LE management especially for RLE. Pt difficulty with sequencing rolling and minimal use of RLE functionally. Suggested potentially trying a leg lifter in future session to increase independence with RLE management during bed mobility. Son and pt open to the idea. He states he already ordered a reacher and some other adaptive equipment. Also discussed potential of bed rails for her bed at home and they may consider this as well. Required mod/max assist again to pivot back to the w/c due to inability to perform stand step transfer (unable to stabilize on RLE and advance herself with her LLE) despite several attempts. Requires cues for management of RUE functionally during transfers and mobility.   Upon return to room recommended either recliner or bed to allow for beter elevation of BLE for edema control. Pt states she does not like the bed, but agreeable to the recliner. Mod assist squat pivot to recliner with cues for technique, head hips relationship, and foot positioning. Propped BLE with several pillows and semi reclined and educated pt and son on importance of positioning even once returning back to bed to aid with overall edema (reports edema is longstanding issue but today seems worse for her than usual). Son and other family member to return tomorrow for formal family education.   Therapy Documentation Precautions:  Precautions Precautions: Fall Precaution Comments: R hemiparesis Required Braces or  Orthoses: Sling Restrictions Weight Bearing Restrictions: No RLE Weight Bearing: Weight bearing as tolerated  Pain: Denies pain.  Therapy/Group: Individual Therapy  Canary Brim Ivory Broad, PT, DPT, CBIS  05/20/2021, 2:08 PM

## 2021-05-21 LAB — CBC
HCT: 36.3 % (ref 36.0–46.0)
Hemoglobin: 11.8 g/dL — ABNORMAL LOW (ref 12.0–15.0)
MCH: 31.6 pg (ref 26.0–34.0)
MCHC: 32.5 g/dL (ref 30.0–36.0)
MCV: 97.3 fL (ref 80.0–100.0)
Platelets: 277 10*3/uL (ref 150–400)
RBC: 3.73 MIL/uL — ABNORMAL LOW (ref 3.87–5.11)
RDW: 14.8 % (ref 11.5–15.5)
WBC: 14.5 10*3/uL — ABNORMAL HIGH (ref 4.0–10.5)
nRBC: 0 % (ref 0.0–0.2)

## 2021-05-21 LAB — BASIC METABOLIC PANEL
Anion gap: 8 (ref 5–15)
BUN: 12 mg/dL (ref 8–23)
CO2: 27 mmol/L (ref 22–32)
Calcium: 8.6 mg/dL — ABNORMAL LOW (ref 8.9–10.3)
Chloride: 104 mmol/L (ref 98–111)
Creatinine, Ser: 0.51 mg/dL (ref 0.44–1.00)
GFR, Estimated: >60 mL/min (ref >60)
Glucose, Bld: 87 mg/dL (ref 70–99)
Potassium: 3.6 mmol/L (ref 3.5–5.1)
Sodium: 139 mmol/L (ref 135–145)

## 2021-05-21 MED ORDER — ALPRAZOLAM 0.25 MG PO TABS
0.2500 mg | ORAL_TABLET | Freq: Two times a day (BID) | ORAL | Status: DC | PRN
  Administered 2021-05-21 – 2021-05-22 (×2): 0.25 mg via ORAL
  Filled 2021-05-21 (×2): qty 1

## 2021-05-21 NOTE — Progress Notes (Signed)
At [place], spoke with son and patient, concerned about increased frequency at HS, interfering with patient's sleep. Last HS PVR's 109 & 0. Requesting to decrease flomax & urecholine. Also concerned about BLE edema. Spoke with Dr. Posey Pronto R/T holding urecholine.  At 1700, PVR=37 At 1945, BM, unable to void, scan=347cc's  At 2038, gave scheduled urecholine.  At 2300, voided small amount, scan= 358cc's. At 0335, voided no scan. At 0615, voided, scan=478, I & O cath=450cc's, urine very cloudy.   Changed foam dressings to bilateral feet. Serous filled blister to dorsum aspect of left foot. Bilateral heels red, un-blanchable. Heels elevated off bed with pillows. Angela Carpenter A

## 2021-05-21 NOTE — Progress Notes (Signed)
Speech Language Pathology Daily Session Note  Patient Details  Name: Angela Carpenter MRN: [id number] Date of Birth: 1940-10-17  Today's Date: 05/21/2021 SLP Individual Time: [id number] SLP Individual Time Calculation (min): 30 min  Short Term Goals: Week 3: SLP Short Term Goal 1 (Week 3): STGs=LTGs due to ELOS  Skilled Therapeutic Interventions: Skilled treatment session focused on completion of family education with the patient's son and sister-in-law. Both were educated regarding patient's current cognitive functioning and strategies to utilize at home to maximize recall and carryover of functional information. Both were also educated regarding word-finding strategies. All verbalized understanding and handouts were given to reinforce information. Patient left upright in recliner with family present. Continue with current plan of care.      Pain No/Denies Pain  Therapy/Group: Individual Therapy  Angela Carpenter 05/21/2021, 3:37 PM

## 2021-05-21 NOTE — Progress Notes (Signed)
PROGRESS NOTE   Subjective/Complaints: Still having issues voiding consistently. Feels that anxiety is playing a part. Only cathed once yesterday. Nurse reports it was cloudy  ROS: Patient denies fever, rash, sore throat, blurred vision, nausea, vomiting, diarrhea, cough, shortness of breath or chest pain, joint or back pain, headache, or mood change.   Objective:   No results found. Recent Labs    05/21/21 0649  WBC 14.5*  HGB 11.8*  HCT 36.3  PLT 277     Recent Labs    05/21/21 0649  NA 139  K 3.6  CL 104  CO2 27  GLUCOSE 87  BUN 12  CREATININE 0.51  CALCIUM 8.6*      Intake/Output Summary (Last 24 hours) at 05/21/2021 1056 Last data filed at 05/21/2021 0800 Gross per 24 hour  Intake 1399 ml  Output 450 ml  Net 949 ml        Physical Exam: Vital Signs Blood pressure (!) 122/59, pulse 79, temperature 98.2 F (36.8 C), temperature source Oral, resp. rate 18, height '5\' 5"'$  (1.651 m), weight 70.3 kg, SpO2 98 %. Constitutional: No distress . Vital signs reviewed. HEENT: NCAT, EOMI, oral membranes moist Neck: supple Cardiovascular: RRR without murmur. No JVD    Respiratory/Chest: CTA Bilaterally without wheezes or rales. Normal effort    GI/Abdomen: BS +, non-tender, non-distended Ext: no clubbing, cyanosis, or edema Psych: pleasant and cooperative  Skin: Warm and dry.  Intact. Musc: No tenderness in bilateral extremities. Neuro: Alert Motor: RUE: Proximally tr/5, hand grip 3+/5--stable in appearance RLE: Proximally 0/5, ankle dorsiflexion 1+/5, stable  Assessment/Plan: 1. Functional deficits which require 3+ hours per day of interdisciplinary therapy in a comprehensive inpatient rehab setting. Physiatrist is providing close team supervision and 24 hour management of active medical problems listed below. Physiatrist and rehab team continue to assess barriers to discharge/monitor patient progress  toward functional and medical goals  Care Tool:  Bathing    Body parts bathed by patient: Right arm, Chest, Abdomen, Right upper leg, Left upper leg, Face, Front perineal area, Buttocks, Right lower leg, Left lower leg   Body parts bathed by helper: Left arm, Buttocks, Front perineal area, Right lower leg, Left lower leg Body parts n/a: Front perineal area   Bathing assist Assist Level: Minimal Assistance - Patient > 75%     Upper Body Dressing/Undressing Upper body dressing   What is the patient wearing?: Hospital gown only Orthosis activity level: Performed by helper  Upper body assist Assist Level: Moderate Assistance - Patient 50 - 74%    Lower Body Dressing/Undressing Lower body dressing      What is the patient wearing?: Incontinence brief     Lower body assist Assist for lower body dressing: Maximal Assistance - Patient 25 - 49%     Toileting Toileting    Toileting assist Assist for toileting: 2 Helpers     Transfers Chair/bed transfer  Transfers assist     Chair/bed transfer assist level: Maximal Assistance - Patient 25 - 49% Chair/bed transfer assistive device: Sliding board   Locomotion Ambulation   Ambulation assist   Ambulation activity did not occur: Safety/medical concerns  Assist level: Minimal Assistance -  Patient > 75% Assistive device: Hand held assist Max distance: 40   Walk 10 feet activity   Assist  Walk 10 feet activity did not occur: Safety/medical concerns  Assist level: Minimal Assistance - Patient > 75% Assistive device: Hand held assist   Walk 50 feet activity   Assist Walk 50 feet with 2 turns activity did not occur: Safety/medical concerns  Assist level: Dependent - Patient 0% Assistive device: Lite Gait    Walk 150 feet activity   Assist Walk 150 feet activity did not occur: Safety/medical concerns  Assist level: Dependent - Patient 0% Assistive device: Lite Gait    Walk 10 feet on uneven surface   activity   Assist Walk 10 feet on uneven surfaces activity did not occur: Safety/medical concerns         Wheelchair     Assist Will patient use wheelchair at discharge?: Yes Type of Wheelchair: Manual    Wheelchair assist level: Supervision/Verbal cueing Max wheelchair distance: 100 ft    Wheelchair 50 feet with 2 turns activity    Assist        Assist Level: Supervision/Verbal cueing   Wheelchair 150 feet activity     Assist      Assist Level: Maximal Assistance - Patient 25 - 49%   Blood pressure (!) 122/59, pulse 79, temperature 98.2 F (36.8 C), temperature source Oral, resp. rate 18, height '5\' 5"'$  (1.651 m), weight 70.3 kg, SpO2 98 %.  Medical Problem List and Plan: 1.  R hemiplegia secondary to Metastatic disease from melanoma s/p L frontal crani 05/01/21  Continue CIR PT, OT, SLP 2.  Age indeterminate DVT in right popliteal vein, and in bilateral posterior tibial and peroneal veins. Chronic DVT in right common femoral and femoral veins. Given age indeterminate DVTs and history of ICH  Dr. Mohamed/NS  recommended prophylactic Lovenox- no IVC filter- resumption of Eliquis in a week or two--- plan to d/w NS this week             -antiplatelet therapy: N/A 3. Pain Management: Tylenol prn.  4. Mood: LCSW to follow for evaluation and support.              -antipsychotic agents: N/A 5. Neuropsych: This patient is capable of making decisions on her own behalf. 6. Skin/Wound Care: Staples removed    8/15 -foam dressing to foot--edema related?  -elevating LE when resting 7. Fluids/Electrolytes/Nutrition: encourage PO  -intake excellent   -protein supp for albumin 8. Paroxymal A fib:/HTN: Monitor BP tid--continue metoprolol daily.             --Off Eliquis due to hemorrhage  HR Controlled on 8/14 9. Glaucoma: Has been stable on timoptic--will resume.  10. Pre-diabetes: Hgb A1C-6.1. Diet liberalized to regular per patient/family request.             -  monitor FBS weekly 11.  Leucocytosis:              --no fevers or other signs of infection.  WBCs 15.8 on 8/8--14.5 8/15  Cont to monitor 12. Hyponatremia: Sodium up to 135 on 8/8  Cont to monitor  13. Urinary retention:   -continue urecholine at 50 mg tid  -continue flomax at night. bp is soft but holding  -OOB to toilet/bsc to void  8/15 will add prn xanax for voiding associated anxiety   -will recheck urine culture as well given cloudy appearance of urine, wbc's 14. Constipation:   - scheduled miralax bid  along with senna-s  Improving  LOS: 18 days A FACE TO FACE EVALUATION WAS PERFORMED  Angela Carpenter 05/21/2021, 10:56 AM

## 2021-05-21 NOTE — Progress Notes (Signed)
Occupational Therapy Session Note  Patient Details  Name: Angela Carpenter MRN: [id number] Date of Birth: April 07, 1941  Today's Date: 05/21/2021 OT Individual Time: 1300-1345 OT Individual Time Calculation (min): 45 min    Short Term Goals: Week 3:  OT Short Term Goal 1 (Week 3): STG= LTG d/t ELOS  Skilled Therapeutic Interventions/Progress Updates:    Pt received sitting in the recliner with her son and SIL present for family education. Demo provided re body mechanics and scooting forward technique. Pt completed ambulatory transfer into the bathroom with CGA overall, min A to manage threshold, with RW. Extensive edu provided re caregiver body mechanics, fall risk reduction, hemi techniques with ADLs, and RUE NMR/ROM. Pt completed toileting tasks with mod A overall, voiding BM. She completed peri hygiene in standing with mod A for occasional R lean and sway. Pt returned to the w/c, min A stand pivot transfer with the grab bar. Pt returned to the w/c and caregiver assisted ROM and self-ROM demonstration provided. Pt passed off to to PT in room.   Therapy Documentation Precautions:  Precautions Precautions: Fall Precaution Comments: R hemiparesis Required Braces or Orthoses: Sling Restrictions Weight Bearing Restrictions: No RLE Weight Bearing: Weight bearing as tolerated  Therapy/Group: Individual Therapy  Curtis Sites 05/21/2021, 6:07 AM

## 2021-05-21 NOTE — Progress Notes (Signed)
Physical Therapy Weekly Progress Note  Patient Details  Name: Angela Carpenter MRN: [id number] Date of Birth: October 09, 1940  Beginning of progress report period: May 13, 2021 End of progress report period: May 21, 2021  Today's Date: 05/21/2021 PT Individual Time: 1345-1440 PT Individual Time Calculation (min): 55 min   Patient has met 0 of 4 short term goals.  Despite not reaching her STG for this reporting period, pt has been making some slow but steady progress over the past week. She requires modA for bed mobility, min to modA for sit<>stand transfers with R knee blocked, modA for squat<>pivot transfers, and is ambulating ~40-19f with modA and RW. Wheelchair mobility has not been a major focus of therapy sessions but will continue to incorporate as needed. She will benefit from further family education and training to prepare for discharge.  Patient continues to demonstrate the following deficits muscle weakness, decreased cardiorespiratoy endurance, unbalanced muscle activation, decreased coordination, and decreased motor planning, and decreased sitting balance, decreased standing balance, decreased postural control, hemiplegia, and decreased balance strategies and therefore will continue to benefit from skilled PT intervention to increase functional independence with mobility.  Patient progressing toward long term goals..  Continue plan of care.  PT Short Term Goals Week 3:  PT Short Term Goal 1 (Week 3): STG = LTG due to ELOS  Skilled Therapeutic Interventions/Progress Updates:     Handoff of care from OT at start of session - finishing up family ed/training with son and sister-in-law (SIL). Pt agreeable to PT session - no reports of pain. Focus of session to continue family ed/training.   Pt wheeled to ortho rehab gym for time in w/c. Focused majority of session on functional transfers with family both actively observing as well as getting hands-on training. PT performed x2 transfers  vis squat<>pivot with modA towards both her stronger and weaker sides. Son performed x2 transfers in similar manner with PT providing CGA for safety and SIL performed x2 transfers as well with PT providing minA for safety.  Spent time emphasizing importance of setup for transfer success, including w/c management with brakes, leg rests, and arm rest removal. Family very appreciative of ed/training and will benefit from further hands-on prior to DC.   Wheeled to ADL apartment room to work on bed transfers as son was somewhat displeased with yesterday's performance with therapy. Pt required to complete stand<>pivot transfer with modA to the bed due to bed height difference. Son requesting a hospital bed at discharge and had questions regarding a lift chair (emphasized that she does not need a lift chair).   Pt returned to her room at end of session and NT requesting pt return to bed for bladder scan. Squat<>pivot with modA back to bed and modA for sit>supine for RLE management. She remained supine with all needs met and pt made comfortable. Family remained at bedside.   Therapy Documentation Precautions:  Precautions Precautions: Fall Precaution Comments: R hemiparesis Required Braces or Orthoses: Sling Restrictions Weight Bearing Restrictions: No RLE Weight Bearing: Weight bearing as tolerated General:    Therapy/Group: Individual Therapy  CAlger Simons8/15/2022, 7:50 AM

## 2021-05-21 NOTE — Progress Notes (Signed)
Occupational Therapy Session Note  Patient Details  Name: Angela Carpenter MRN: [id number] Date of Birth: 1941-05-05  Today's Date: 05/21/2021 OT Individual Time: 1000-1015 OT Individual Time Calculation (min): 15 min  and Today's Date: 05/21/2021 OT Concurrent Time: 1015-1100 OT Concurrent Time Calculation (min): 45 min   Short Term Goals: Week 2:  OT Short Term Goal 1 (Week 2): Pt will complete UB dressing with MIN A OT Short Term Goal 1 - Progress (Week 2): Met OT Short Term Goal 2 (Week 2): Pt will thread BLE into pants with A for LE positioning only PRN OT Short Term Goal 2 - Progress (Week 2): Progressing toward goal OT Short Term Goal 3 (Week 2): Pt will transfer consistently to toilet with MOD A and LRAD OT Short Term Goal 3 - Progress (Week 2): Met OT Short Term Goal 4 (Week 2): Pt will recall adapative bathing strategies with MIN cuing and supervision OT Short Term Goal 4 - Progress (Week 2): Met  Skilled Therapeutic Interventions/Progress Updates:    Patient seated in recliner, alert and ready for therapy session.  She denies pain or need to use the bathroom.  Sit pivot transfer recliner to w/c with mod A.  Assisted to therapy gym via w/c.  Sit pivot transfer w/c to mat table with mod A.  She is able to maintain unsupported sitting for NMRE for 40 minutes with CS during which time she participated in posture, core strengthening, trunk mobility, lateral reach, upper body AROM/AAROM/weight bearing and strengthening activities.  Mod A back to w/c where she completed bilateral upper body distal coordination and strengthening activities.  Sit pivot transfer back to recliner at close of session with mod A.  Seat belt alarm set and call bell in hand.    Therapy Documentation Precautions:  Precautions Precautions: Fall Precaution Comments: R hemiparesis Required Braces or Orthoses: Sling Restrictions Weight Bearing Restrictions: No RLE Weight Bearing: Weight bearing as  tolerated   Therapy/Group: Individual Therapy  Carlos Levering 05/21/2021, 7:38 AM

## 2021-05-22 ENCOUNTER — Telehealth: Payer: Self-pay | Admitting: *Deleted

## 2021-05-22 NOTE — Telephone Encounter (Signed)
Per son [name], patient will not be able to keep her appt with  Dr. Alen Blew on 8/23.    She is  being d/c'd from [place] on 8/23 and not sure about time of the d/c.   She has initial [facility] appts here on 8/25 at 1 PM, 1:30 PM and 2:30 PM.    Dr. Alen Blew informed. Per Dr. Alen Blew, offer new appt date/time 8/29 at 10 am.  Son informed/accepted. Appt with Dr. Alen Blew on 8/23 cancelled. Schedule message sent to schedule new appt.

## 2021-05-22 NOTE — Progress Notes (Signed)
Recreational Therapy Session Note  Patient Details  Name: Angela Carpenter MRN: [id number] Date of Birth: April 25, 1941 Today's Date: 05/22/2021 Time:  1330-1435 Pain: no c/o Skilled Therapeutic Interventions/Progress Updates: Pt referred by team for participation in Coping/Stress Management group.  Pt participated in discussion with an emphasis on stress exploration including identifying factors that contribute to stress, compartmentalization of stress, factors that protect against stress and potential coping strategies.  Strategies included deep breathing exercise, progressive muscle relaxation, imagery, challenging irrational thoughts and therapeutic use of music.    Therapy/Group: Group Therapy  Kyaira Trantham 05/22/2021, 3:50 PM

## 2021-05-22 NOTE — Progress Notes (Signed)
At 2215, up to void, PVR=0. At 0320, up to void, PVR=0. Continues with edema to BLE's. Bilateral heels red, non-blanchable. Patrici Ranks A

## 2021-05-22 NOTE — Progress Notes (Signed)
PROGRESS NOTE   Subjective/Complaints: Anxiety much better last night with xanax. Voided twice with 0 PVR!  ROS: Patient denies fever, rash, sore throat, blurred vision, nausea, vomiting, diarrhea, cough, shortness of breath or chest pain, joint or back pain, headache, or mood change.   Objective:   No results found. Recent Labs    05/21/21 0649  WBC 14.5*  HGB 11.8*  HCT 36.3  PLT 277     Recent Labs    05/21/21 0649  NA 139  K 3.6  CL 104  CO2 27  GLUCOSE 87  BUN 12  CREATININE 0.51  CALCIUM 8.6*      Intake/Output Summary (Last 24 hours) at 05/22/2021 1054 Last data filed at 05/22/2021 0730 Gross per 24 hour  Intake 860 ml  Output 500 ml  Net 360 ml        Physical Exam: Vital Signs Blood pressure (!) 99/55, pulse 75, temperature 98.3 F (36.8 C), temperature source Oral, resp. rate 18, height '5\' 5"'$  (1.651 m), weight 70.3 kg, SpO2 99 %. Constitutional: No distress . Vital signs reviewed. HEENT: NCAT, EOMI, oral membranes moist Neck: supple Cardiovascular: RRR without murmur. No JVD    Respiratory/Chest: CTA Bilaterally without wheezes or rales. Normal effort    GI/Abdomen: BS +, non-tender, non-distended Ext: no clubbing, cyanosis, 1+ LE edema Psych: pleasant and cooperative  Skin: Warm and dry. Blisters on feet. Scalp wound healing with scabs lossening Musc: No tenderness in bilateral extremities. Neuro: Alert Motor: RUE: Proximally tr/5, hand grip 3+/5--no change RLE: Proximally 0/5, ankle dorsiflexion 1+/5, no change  Assessment/Plan: 1. Functional deficits which require 3+ hours per day of interdisciplinary therapy in a comprehensive inpatient rehab setting. Physiatrist is providing close team supervision and 24 hour management of active medical problems listed below. Physiatrist and rehab team continue to assess barriers to discharge/monitor patient progress toward functional and medical  goals  Care Tool:  Bathing    Body parts bathed by patient: Right arm, Chest, Abdomen, Right upper leg, Left upper leg, Face, Front perineal area, Buttocks, Right lower leg, Left lower leg, Left arm   Body parts bathed by helper: Left arm, Buttocks, Front perineal area, Right lower leg, Left lower leg Body parts n/a: Front perineal area   Bathing assist Assist Level: Minimal Assistance - Patient > 75%     Upper Body Dressing/Undressing Upper body dressing   What is the patient wearing?: Pull over shirt Orthosis activity level: Performed by helper  Upper body assist Assist Level: Minimal Assistance - Patient > 75%    Lower Body Dressing/Undressing Lower body dressing      What is the patient wearing?: Underwear/pull up, Pants     Lower body assist Assist for lower body dressing: Minimal Assistance - Patient > 75%     Toileting Toileting    Toileting assist Assist for toileting: Moderate Assistance - Patient 50 - 74%     Transfers Chair/bed transfer  Transfers assist     Chair/bed transfer assist level: Minimal Assistance - Patient > 75% Chair/bed transfer assistive device: Programmer, multimedia   Ambulation assist   Ambulation activity did not occur: Safety/medical concerns  Assist  level: Minimal Assistance - Patient > 75% Assistive device: Hand held assist Max distance: 40   Walk 10 feet activity   Assist  Walk 10 feet activity did not occur: Safety/medical concerns  Assist level: Minimal Assistance - Patient > 75% Assistive device: Hand held assist   Walk 50 feet activity   Assist Walk 50 feet with 2 turns activity did not occur: Safety/medical concerns  Assist level: Dependent - Patient 0% Assistive device: Lite Gait    Walk 150 feet activity   Assist Walk 150 feet activity did not occur: Safety/medical concerns  Assist level: Dependent - Patient 0% Assistive device: Lite Gait    Walk 10 feet on uneven surface   activity   Assist Walk 10 feet on uneven surfaces activity did not occur: Safety/medical concerns         Wheelchair     Assist Will patient use wheelchair at discharge?: Yes Type of Wheelchair: Manual    Wheelchair assist level: Supervision/Verbal cueing Max wheelchair distance: 100 ft    Wheelchair 50 feet with 2 turns activity    Assist        Assist Level: Supervision/Verbal cueing   Wheelchair 150 feet activity     Assist      Assist Level: Maximal Assistance - Patient 25 - 49%   Blood pressure (!) 99/55, pulse 75, temperature 98.3 F (36.8 C), temperature source Oral, resp. rate 18, height '5\' 5"'$  (1.651 m), weight 70.3 kg, SpO2 99 %.  Medical Problem List and Plan: 1.  R hemiplegia secondary to Metastatic disease from melanoma s/p L frontal crani 05/01/21  -Continue CIR therapies including PT, OT, and SLP  2.  Age indeterminate DVT in right popliteal vein, and in bilateral posterior tibial and peroneal veins. Chronic DVT in right common femoral and femoral veins. Given age indeterminate DVTs and history of ICH  Dr. Mohamed/NS  recommended prophylactic Lovenox- no IVC filter- resumption of Eliquis in a week or two--- plan to d/w Dr. Kathyrn Sheriff this week pending repeat dopplers  -pt with increased swelling LE's--recheck dopplers today             -antiplatelet therapy: N/A 3. Pain Management: Tylenol prn.  4. Mood: LCSW to follow for evaluation and support.              -antipsychotic agents: N/A 5. Neuropsych: This patient is capable of making decisions on her own behalf. 6. Skin/Wound Care: Staples removed    8/15 -foam dressing to foot--likely edema related   -elevating LE when resting 7. Fluids/Electrolytes/Nutrition: encourage PO  -intake excellent   -protein supp for albumin 8. Paroxymal A fib:/HTN: Monitor BP tid--continue metoprolol daily.             --Off Eliquis due to hemorrhage  HR Controlled on 8/16 9. Glaucoma: Has been stable on  timoptic--will resume.  10. Pre-diabetes: Hgb A1C-6.1. Diet liberalized to regular per patient/family request.             - monitor FBS weekly 11.  Leucocytosis:              --no fevers or other signs of infection.  WBCs 15.8 on 8/8--14.5 8/15  Cont to monitor 12. Hyponatremia: Sodium up to 135 on 8/8  Cont to monitor  13. Urinary retention:   -continue urecholine at 50 mg tid  -continue flomax at night. bp is soft but holding  -OOB to toilet/bsc to void  8/15 added prn xanax for voiding associated anxiety --helped  quite a bit!  -will recheck urine culture as well given cloudy appearance of urine, wbc's 14. Constipation:   - scheduled miralax bid along with senna-s  Improving  LOS: 19 days A FACE TO FACE EVALUATION WAS PERFORMED  [name] 05/22/2021, 10:54 AM

## 2021-05-22 NOTE — Patient Care Conference (Signed)
Inpatient RehabilitationTeam Conference and Plan of Care Update Date: 05/22/2021   Time: 10:21 AM    Patient Name: Angela Carpenter      Medical Record Number: [id number]  Date of Birth: 05/23/1941 Sex: Female         Room/Bed: 4W05C/4W05C-01 Payor Info: Payor: MEDICARE / Plan: MEDICARE PART A AND B / Product Type: *No Product type* /    Admit Date/Time:  05/03/2021  3:16 PM  Primary Diagnosis:  Metastatic cancer to brain [facility])  Hospital Problems: Principal Problem:   Metastatic cancer to brain [facility]) Active Problems:   Seizure disorder, focal motor ([place])   Right hemiplegia ([place])   Right hemiparesis ([place])   Urinary retention   Slow transit constipation   Leucocytosis   Benign essential HTN    Expected Discharge Date: Expected Discharge Date: 05/29/21  Team Members Present: Physician leading conference: Dr. Alger Simons Social Worker Present: [name], [place] Nurse Present: Dorthula Nettles, RN PT Present: Apolinar Junes, PT OT Present: Laverle Hobby, OT SLP Present: [name], SLP PPS Coordinator present : Gunnar Fusi, SLP     Current Status/Progress Goal Weekly Team Focus  Bowel/Bladder   Continent of B&B, complains of nocturia, 1 episode of retention on 08/15 with I&O cath of 450cc's. Taking flomax & urecholine.  remain continent of B&B, PVR's < 200cc's.  timed toileting every 4 hours, continue to monitor PVR's   Swallow/Nutrition/ Hydration             ADL's   Min A UB, mod A LB, RUE still improving, weak gross grasp, full GE elbow flexion and extension and 20 degrees of shoulder flexion. Mod A transfers often limited by anxiety/fatigue  (S) to min A  RUE NMR, ADLs, transfers, hemi technique, family edu   Mobility   modA for bed mobility, modA for squat<>pivot and stand<>pivot transfers. minA for sit<>stand transfers. ModA gait 40-2f with RW and AFO.  CGA bed mobility and transfers mod A gait and stairs  Family education and training, DC planning,  functional transfers, pt education, R NMR, gait training.   Communication   Mod I- can require cueing at times depending on fatigue  Supervision  Family Education   Safety/Cognition/ Behavioral Observations  Min A for recall  Sup A  use of strategies and family education   Pain   Denies pain  remain pain free  assess Pain every 4 hours   Skin   approximated surgical incision on top of head OTA, Foam dressing to scabs on top of right foot, serous filled blister on top of left foot with foam dressing. Heels red and non-blanchable, elevate heels off of bed  remain free of skin break down and infection  monitor heels, crani incision, and foam dressings to top of feet.     Discharge Planning:  D/c to home with support from son who will move in the home, as well as, siblings and in-laws. Fam edu completd on 8/15, and to be completed on 8/16 1pm-3pm with pt son and SIL.   Team Discussion: Still having urinary retention, added Xanax for the anxiety. Medically stable. Continent bowel, occasional I&O cath for retention. No complaints of pain, sleeping okay, blisters to both feet, old rash to the back. Making good progress. Min assist with upper body, mod assist with lower body, getting 20 degree shoulder flexion return. Mod assist with RW up to  45 ft. Min/mod assist transfers. SLP reports seeing her 3 times weekly. Working on [employer] short-term recall.  Patient on target to meet rehab goals: yes  *See Care Plan and progress notes for long and short-term goals.   Revisions to Treatment Plan:  MD adding Xanax.  Teaching Needs: Family education, medication management, anxiety management, bladder management, skin/wound care, transfer training, gait training, balance training, endurance training, safety awareness.  Current Barriers to Discharge: Decreased caregiver support, Medical stability, Home enviroment access/layout, Incontinence, Wound care, Lack of/limited family support, Weight, and Medication  compliance  Possible Resolutions to Barriers: Continue current medications, provide emotional support.     Medical Summary Current Status: improving bladder emptying with xanax (anxiety component). surgical wound healing. bp control. LE edema  Barriers to Discharge: Medical stability   Possible Resolutions to Barriers/Weekly Focus: daily wound assessment, pt data/vs assessment   Continued Need for Acute Rehabilitation Level of Care: The patient requires daily medical management by a physician with specialized training in physical medicine and rehabilitation for the following reasons: Direction of a multidisciplinary physical rehabilitation program to maximize functional independence : Yes Medical management of patient stability for increased activity during participation in an intensive rehabilitation regime.: Yes Analysis of laboratory values and/or radiology reports with any subsequent need for medication adjustment and/or medical intervention. : Yes   I attest that I was present, lead the team conference, and concur with the assessment and plan of the team.   Cristi Loron 05/22/2021, 1:58 PM

## 2021-05-22 NOTE — Progress Notes (Signed)
Occupational Therapy Session Note  Patient Details  Name: Angela Carpenter MRN: [id number] Date of Birth: 31-Dec-1940  Today's Date: 05/22/2021 OT Individual Time: 0825-1000 OT Individual Time Calculation (min): 95 min    Short Term Goals: Week 3:  OT Short Term Goal 1 (Week 3): STG= LTG d/t ELOS  Skilled Therapeutic Interventions/Progress Updates:    Pt received on toilet with NT supervising. Reviewed safety plan with pt and NT and need to stop using the stedy. Pt reporting xanax really helping voiding last night however pt unable to void currently. Agreeable to attempt void after shower. Pt used AE to doff LB clothing seated on toilet with (S). Min A sit > stand from [facility] over toilet and CGA ambulatory transfer into the shower. Mod cueing still required to sequence turn to sit on TTB. Increased time/effort provided to really facilitate RUE integration and forced use during bathing. Min A overall for UB and LB bathing. Min A to facilitate RUE weightbearing on the bench to encourage weightshift over to wash peri areas. Pt required mod A to sti > stand from the TTB and had difficulty scooting over with facilitation at the RLE. Pt completed amublatory transfer back to her w/c with CGA using the RW. Min A to don shirt- to pull overhead. Pt able to use reacher to don pants and then required min A to pull up in standing. Pt used sockaid to don socks with min A overall. Increased time provided in session to allow pt to problem solve. Very poor carryover of techniques- after donning one sock pt was unable to remember how to set up the sockaid for the second one. RN notified of pt request for anxiety medication. He administered this and then pt was guided through self PROM for her RUE and given a corresponding handout. Pt was encouraged to attempt and void urine again. She completed sit > stand with min A and CGA for pivot to [facility] over toilet. Pt unable to void. Pt completed transfer back to the w/c with min A. Pt  transferred to the recliner and feet were elevated for edema management. Pt left with all needs met.   [place]o Stim One applied to pt's middle deltoid for joint approximation and muscle activation for 60 min unattended. No adverse skin reactions or reports of pain.  330 pulse width 35 Hz pulse rate On 8 sec/ off 8 sec Ramp up/ down 2 sec Symmetrical Biphasic wave form  Max intensity 126m at 500 Ohm load   Therapy Documentation Precautions:  Precautions Precautions: Fall Precaution Comments: R hemiparesis Required Braces or Orthoses: Sling Restrictions Weight Bearing Restrictions: No RLE Weight Bearing: Weight bearing as tolerated   Therapy/Group: Individual Therapy  SCurtis Sites8/16/2022, 6:06 AM

## 2021-05-22 NOTE — Progress Notes (Signed)
Occupational Therapy Session Note  Patient Details  Name: Angela Carpenter MRN: [id number] Date of Birth: Sep 16, 1941  Today's Date: 05/22/2021 OT Group Time: [id number] OT Group Time Calculation (min): 67 min   Short Term Goals: Week 3:  OT Short Term Goal 1 (Week 3): STG= LTG d/t ELOS  Skilled Therapeutic Interventions/Progress Updates:  Pt participated in group session with a focus on stress mgmt, education on healthy coping strategies, and social interaction.  Session focus on breaking down stressors into "daily hassles," "major life stressors" and "life circumstances" in an effort to allow pts to chunk their stressors into groups. Pt actively sharing stressors and contributing to group conversation. Offered education on factors that protect [place] against stress such as "daily uplifts," "healthy coping strategies" and "protective factors." Encouraged all group members to make an effort to actively recall one event from their day that was a daily uplift in an effort to protect their mindset from stressors. Issued pt handouts on healthy coping strategies to implement into routine. Pt transported back to room by RT. Therapy Documentation Precautions:  Precautions Precautions: Fall Precaution Comments: R hemiparesis Required Braces or Orthoses: Sling Restrictions Weight Bearing Restrictions: No RLE Weight Bearing: Weight bearing as tolerated   Pain: Pt reports no pain during session.    Therapy/Group: Group Therapy  Precious Haws 05/22/2021, 3:56 PM

## 2021-05-22 NOTE — Progress Notes (Signed)
Occupational Therapy Session Note  Patient Details  Name: Angela Carpenter MRN: [id number] Date of Birth: 11-03-1940  Today's Date: 05/22/2021 OT Individual Time: 1300-1330 OT Individual Time Calculation (min): 30 min    Short Term Goals: Week 3:  OT Short Term Goal 1 (Week 3): STG= LTG d/t ELOS  Skilled Therapeutic Interventions/Progress Updates:    Pt sitting in recliner with her son and brother present, no c/o pain. Agreeable to session with pt's brother attending to observe RUE AROM/AAROM exercises to perform at home. Pt completed ambulatory transfer to the w/c with mod A to power up and CGA for transfer using RW. Pt was taken to the therapy gym and positioned at high table. Edu provided re gravity eliminated exercises and shoulder tightness/ROM. Pt completed GE shoulder horizontal abduction/adduction with only min facilitation for full adduction and mod facilitation for abduction. Provided gentle PROM to pt's shoulder and scapula. Pt was passed off to OTA performing group.   Therapy Documentation Precautions:  Precautions Precautions: Fall Precaution Comments: R hemiparesis Required Braces or Orthoses: Sling Restrictions Weight Bearing Restrictions: No RLE Weight Bearing: Weight bearing as tolerated   Therapy/Group: Individual Therapy  Curtis Sites 05/22/2021, 12:32 PM

## 2021-05-22 NOTE — Progress Notes (Signed)
Patient ID: Angela Carpenter, female   DOB: May 28, 1941, 80 y.o.   MRN: [id number]  SW met with pt and pt brother in room to provide updates from team conference, and pt d/c date now 8/23 due to gains made in rehab. Pt aware SW to follow-up with pt son. Pt reports her son will be here later on today.   SW went back to pt room to meet with tp son as requested, and pt reported son/brother just left.  SW called pt son [name] to review d/c recommendations- DME and HH and process for ordering DME and arranging [facility] going forward. SW informed will confirm with therapy team if hospital bed is needed. SW will order all DME once confirmed needs.   SW updated [place] on HHA change.   [name], MSW, [place] Office: [phone] Cell: [phone] Fax: [phone]

## 2021-05-22 NOTE — Progress Notes (Signed)
Physical Therapy Session Note  Patient Details  Name: Angela Carpenter MRN: [id number] Date of Birth: 06/29/1941  Today's Date: 05/22/2021 PT Individual Time: 1440-1545 PT Individual Time Calculation (min): 65 min  Short Term Goals: Week 3:  PT Short Term Goal 1 (Week 3): STG = LTG due to ELOS  Skilled Therapeutic Interventions/Progress Updates:     Patient in w/c handed off from [place], [place] following group session upon PT arrival. Patient alert and agreeable to PT session. Patient denied pain during session. Patient's son and brother present for hands on training throughout session. Focused on blocked practice transfer training and household gait training throughout session. Discussed equipment needs and family states that there is room for a hospital bed and following training yesterday, they are interested in a hospital bed to reduce burden of care and improve patient positioning in the bed, CSW made aware.   Therapeutic Activity: Transfers: Patient performed squat pivot/lateral scoot transfers w/c<>mat table with w/c on R then w/c on L x3 with PT demonstrating, then her son assisting, then her brother on each side with min-mod A, L>R. Provided cues for patient and caregiver hand placement, foot placement, and head-hips relationship for proper technique and decreased assist with transfers. Cued family to use over top technique due to height difference for improved mechanics and posterior weight shift to counterbalance patient's weight rather than lifting.  Patient performed sit to/from stand 2x3 using RW from w/c with sequence of person assisting as above with min A from PT and min-mod from family due to over guarding. Provided verbal cues for hand placement for patient and caregiver, foot placement, scooting forward, and forward weight shift throughout due to patient's posterior bias. Patient performed stand pivot with RW w/c>recliner with her son's assist with min cues for assist technique when  transferring to the R. Patient's son able to teach back cues from previous transfers.   Gait Training:  Patient ambulated >80 feet using RW with min A for AD management and balance. Ambulated with decreased gait speed, step height and step length R>L, demonstrates R toe out with overuse of adductors during limb advancement and decreased hip flexor and hamstring activation on R, forward trunk flexion and downward head gaze, and patient able to clear R foot in socks and no signs of R knee buckling throughout. Provided verbal cues for erect posture, leading with her R heel to improve R foot clearance and step length, B gluteal activation in stance, looking ahead, and AD management for safety.  Wheelchair Mobility:  Patient was transported in the w/c with total A throughout session for energy conservation and time management.  Patient in recliner with B lower extremities elevated with her family in the room at end of session with breaks locked, seat belt alarm set, and all needs within reach.   Therapy Documentation Precautions:  Precautions Precautions: Fall Precaution Comments: R hemiparesis Required Braces or Orthoses: Sling Restrictions Weight Bearing Restrictions: No RLE Weight Bearing: Weight bearing as tolerated    Therapy/Group: Individual Therapy  Renley Banwart L Oumar Marcott PT, DPT  05/22/2021, 6:05 PM

## 2021-05-23 ENCOUNTER — Inpatient Hospital Stay (HOSPITAL_COMMUNITY): Payer: Medicare Other

## 2021-05-23 DIAGNOSIS — A499 Bacterial infection, unspecified: Secondary | ICD-10-CM

## 2021-05-23 DIAGNOSIS — N390 Urinary tract infection, site not specified: Secondary | ICD-10-CM

## 2021-05-23 DIAGNOSIS — I82409 Acute embolism and thrombosis of unspecified deep veins of unspecified lower extremity: Secondary | ICD-10-CM

## 2021-05-23 MED ORDER — SULFAMETHOXAZOLE-TRIMETHOPRIM 800-160 MG PO TABS
1.0000 | ORAL_TABLET | Freq: Two times a day (BID) | ORAL | Status: AC
  Administered 2021-05-23 – 2021-05-27 (×10): 1 via ORAL
  Filled 2021-05-23 (×10): qty 1

## 2021-05-23 NOTE — Progress Notes (Signed)
Occupational Therapy Session Note  Patient Details  Name: Angela Carpenter MRN: [id number] Date of Birth: 07-Jul-1941  Today's Date: 05/23/2021 OT Individual Time: 8648-4720 OT Individual Time Calculation (min): 24 min    Short Term Goals: Week 2:  OT Short Term Goal 1 (Week 2): Pt will complete UB dressing with MIN A OT Short Term Goal 1 - Progress (Week 2): Met OT Short Term Goal 2 (Week 2): Pt will thread BLE into pants with A for LE positioning only PRN OT Short Term Goal 2 - Progress (Week 2): Progressing toward goal OT Short Term Goal 3 (Week 2): Pt will transfer consistently to toilet with MOD A and LRAD OT Short Term Goal 3 - Progress (Week 2): Met OT Short Term Goal 4 (Week 2): Pt will recall adapative bathing strategies with MIN cuing and supervision OT Short Term Goal 4 - Progress (Week 2): Met Week 3:  OT Short Term Goal 1 (Week 3): STG= LTG d/t ELOS OT Short Term Goal 1 - Progress (Week 3): Progressing toward goal Week 4:  OT Short Term Goal 1 (Week 4): STG=LTG d/t ELOS  Skilled Therapeutic Interventions/Progress Updates:    Pt greeted at time of session sitting up in recliner with family member in room, no pain reported, needing to toilet. Sit <> stands throughout session with Min/Mod with assist for hand/foot placement. Pt ambulated recliner <> bathroom and performed toilet transfer Min A with most assist getting over threshold, all with RW. Pt did have BM and urine, standing for posterior hygiene CGA and Min A for clothing management over R hip. Ambulated back to recliner same as above, set up alarm on call bell in reach and BLEs and RUE elevated.   Therapy Documentation Precautions:  Precautions Precautions: Fall Precaution Comments: R hemiparesis Required Braces or Orthoses: Sling Restrictions Weight Bearing Restrictions: No RLE Weight Bearing: Weight bearing as tolerated    Therapy/Group: Individual Therapy  [name] 05/23/2021, 2:28 PM

## 2021-05-23 NOTE — Progress Notes (Signed)
Physical Therapy Session Note  Patient Details  Name: Angela Carpenter MRN: [id number] Date of Birth: 12-Jan-1941  Today's Date: 05/23/2021 PT Individual Time: 1055-1210 PT Individual Time Calculation (min): 75 min   Short Term Goals: Week 2:  PT Short Term Goal 1 (Week 2): supine to side to sit w/cga only PT Short Term Goal 2 (Week 2): pt will propel wc using hemi technique x 165f w/additional time and supervision PT Short Term Goal 3 (Week 2): Patient will perform basic transfers with min A consistently. PT Short Term Goal 4 (Week 2): Patient will ambulate >75 feet with mod A using LRAD.  Skilled Therapeutic Interventions/Progress Updates:     Patient in recliner with her son and SIL in the room upon PT arrival. Patient alert and agreeable to PT session. Patient denied pain during session. Reports waking x2 last night for toileting, but able to return to sleep shortly after.   Focused session on family education for transfer and gait training. Discussed DME needs, adaptive equipment, d/c planning, and home and fall safety with family while vascular obtained venus doppler during session.  Therapeutic Activity: Bed Mobility: Patient performed supine to/from sit with min A for R lower extremity management with use of bed rail, SIL assisted with patient sitting up. Provided verbal cues for when to initiate assist to allow patient to perform as much as possible on her own. Transfers: Patient performed sit to/from stand x3 and stand pivot x2 with min A using RW with son or SIL providing assist and PT SBA for safety. Required 2 attempts x1 due to posterior bias, educated family on returning patient to sitting to reset and try again for safety when transfer is requiring increased assist. Patient performed sit to stand x2 without AD with PT assisting focused on balance training with a functional activity. Family provided verbal cues for scooting forward, foot placement, hand placement, and forward  weight shift with PT interjecting for foot placement x2.  Gait Training:  Patient ambulated 126 feet, 130 feet, and 241 feet using RW with CGA with family alternating assisting patient and w/c follow for safety/endurance. Ambulated with decreased gait speed, step height and step length R>L, demonstrates R toe out with overuse of adductors during limb advancement and decreased hip flexor and hamstring activation on R, forward trunk flexion and downward head gaze. Deviations more prominent with fatigue. Provided verbal cues for erect posture, leading with her R heel to improve R foot clearance and step length, B gluteal activation in stance, looking ahead, and AD management for safety. Performed step-taps to 8" then 6" step up with R foot 2x5 with max progressing to mod A for foot placement for improved hip flexor and hamstring activation during R swing. Performed with single HHA on L and min-mod A for balance Performed step-taps to 6" step on L x8 for improved R weight shift and gluteal activation in stance during gait. Performed with single HHA on L x2 then without AD with min-mod A for balance.  Patient in recliner with her family in the room and B lower extremities elevated at end of session with breaks locked, seat belt alarm set, and all needs within reach.   Therapy Documentation Precautions:  Precautions Precautions: Fall Precaution Comments: R hemiparesis Required Braces or Orthoses: Sling Restrictions Weight Bearing Restrictions: No RLE Weight Bearing: Weight bearing as tolerated    Therapy/Group: Individual Therapy  Korissa Horsford L Hameed Kolar PT, DPT  05/23/2021, 12:35 PM

## 2021-05-23 NOTE — Progress Notes (Signed)
Occupational Therapy Weekly Progress Note  Patient Details  Name: Angela Carpenter MRN: [id number] Date of Birth: 1941-02-15  Beginning of progress report period: May 16, 2021 End of progress report period: May 23, 2021  Patient has met 3 of 12 long term goals.  Short term goals not set due to estimated length of stay, however pt was extended another week to continue working toward her LTG's set at supervision to min A level d/t inconsistent performance at times. [name] continues to make great progress and is motivated to maximize her return to independence.   Patient continues to demonstrate the following deficits: muscle weakness, unbalanced muscle activation, decreased coordination, and decreased motor planning, decreased initiation, decreased awareness, decreased problem solving, decreased safety awareness, decreased memory, and delayed processing, and decreased standing balance and decreased balance strategies and therefore will continue to benefit from skilled OT intervention to enhance overall performance with BADL, iADL, and Reduce care partner burden.  See Patient's Care Plan for progression toward long term goals.  Patient progressing toward long term goals..  Continue plan of care.     Curtis Sites 05/23/2021, 6:11 AM

## 2021-05-23 NOTE — Progress Notes (Signed)
Speech Language Pathology Daily Session Note  Patient Details  Name: Angela Carpenter MRN: [id number] Date of Birth: Jan 09, 1941  Today's Date: 05/23/2021 SLP Individual Time: 1431-1515 SLP Individual Time Calculation (min): 44 min  Short Term Goals: Week 3: SLP Short Term Goal 1 (Week 3): STGs=LTGs due to ELOS  Skilled Therapeutic Interventions: Pt seen for skilled ST with focus on cognitive goals, pt family member in room throughout. SLP facilitating med management task with TID pillbox by providing Supervision level cues for 100% accuracy. Pt independently able to ID and correct 1 med error during exercise. SLP providing ongoing education with pt and family for supervision with all higher level cognitive tasks at this time to increase safety at home. Pt endorses cognitive function is close to or at baseline. Pt left in recliner with alarm set and family present. Cont ST POC.   Pain Pain Assessment Pain Scale: 0-10 Pain Score: 0-No pain  Therapy/Group: Individual Therapy  Dewaine Conger 05/23/2021, 3:09 PM

## 2021-05-23 NOTE — Progress Notes (Signed)
PROGRESS NOTE   Subjective/Complaints: Had a good night. Emptying bladder. Pain controlled. Happy that we gave her extra time to improve on rehab  ROS: Patient denies fever, rash, sore throat, blurred vision, nausea, vomiting, diarrhea, cough, shortness of breath or chest pain, joint or back pain, headache, or mood change.   Objective:   No results found. Recent Labs    05/21/21 0649  WBC 14.5*  HGB 11.8*  HCT 36.3  PLT 277     Recent Labs    05/21/21 0649  NA 139  K 3.6  CL 104  CO2 27  GLUCOSE 87  BUN 12  CREATININE 0.51  CALCIUM 8.6*      Intake/Output Summary (Last 24 hours) at 05/23/2021 [id number] Last data filed at 05/22/2021 1900 Gross per 24 hour  Intake 440 ml  Output --  Net 440 ml        Physical Exam: Vital Signs Blood pressure (!) 113/55, pulse 71, temperature 97.7 F (36.5 C), temperature source Oral, resp. rate 18, height '5\' 5"'$  (1.651 m), weight 70.3 kg, SpO2 97 %. Constitutional: No distress . Vital signs reviewed. HEENT: NCAT, EOMI, oral membranes moist Neck: supple Cardiovascular: RRR without murmur. No JVD    Respiratory/Chest: CTA Bilaterally without wheezes or rales. Normal effort    GI/Abdomen: BS +, non-tender, non-distended Ext: no clubbing, cyanosis, 1+LE edema Psych: pleasant and cooperative  Skin: Warm and dry. Blisters on feet. Scalp wound healing with scabs lossening Musc: No tenderness in bilateral extremities. Neuro: Alert Motor: RUE: Proximally tr/5, hand grip 3+/5--no change RLE: Proximally 0/5, ankle dorsiflexion 1+/5, no change  Assessment/Plan: 1. Functional deficits which require 3+ hours per day of interdisciplinary therapy in a comprehensive inpatient rehab setting. Physiatrist is providing close team supervision and 24 hour management of active medical problems listed below. Physiatrist and rehab team continue to assess barriers to discharge/monitor patient  progress toward functional and medical goals  Care Tool:  Bathing    Body parts bathed by patient: Right arm, Chest, Abdomen, Right upper leg, Left upper leg, Face, Front perineal area, Buttocks, Right lower leg, Left lower leg, Left arm   Body parts bathed by helper: Left arm, Buttocks, Front perineal area, Right lower leg, Left lower leg Body parts n/a: Front perineal area   Bathing assist Assist Level: Minimal Assistance - Patient > 75%     Upper Body Dressing/Undressing Upper body dressing   What is the patient wearing?: Pull over shirt Orthosis activity level: Performed by helper  Upper body assist Assist Level: Minimal Assistance - Patient > 75%    Lower Body Dressing/Undressing Lower body dressing      What is the patient wearing?: Underwear/pull up, Pants     Lower body assist Assist for lower body dressing: Minimal Assistance - Patient > 75%     Toileting Toileting    Toileting assist Assist for toileting: Moderate Assistance - Patient 50 - 74%     Transfers Chair/bed transfer  Transfers assist     Chair/bed transfer assist level: Minimal Assistance - Patient > 75% Chair/bed transfer assistive device: Programmer, multimedia   Ambulation assist   Ambulation activity did not  occur: Safety/medical concerns  Assist level: Minimal Assistance - Patient > 75% Assistive device: Hand held assist Max distance: 40   Walk 10 feet activity   Assist  Walk 10 feet activity did not occur: Safety/medical concerns  Assist level: Minimal Assistance - Patient > 75% Assistive device: Hand held assist   Walk 50 feet activity   Assist Walk 50 feet with 2 turns activity did not occur: Safety/medical concerns  Assist level: Dependent - Patient 0% Assistive device: Lite Gait    Walk 150 feet activity   Assist Walk 150 feet activity did not occur: Safety/medical concerns  Assist level: Dependent - Patient 0% Assistive device: Lite Gait    Walk  10 feet on uneven surface  activity   Assist Walk 10 feet on uneven surfaces activity did not occur: Safety/medical concerns         Wheelchair     Assist Will patient use wheelchair at discharge?: Yes Type of Wheelchair: Manual    Wheelchair assist level: Supervision/Verbal cueing Max wheelchair distance: 100 ft    Wheelchair 50 feet with 2 turns activity    Assist        Assist Level: Supervision/Verbal cueing   Wheelchair 150 feet activity     Assist      Assist Level: Maximal Assistance - Patient 25 - 49%   Blood pressure (!) 113/55, pulse 71, temperature 97.7 F (36.5 C), temperature source Oral, resp. rate 18, height '5\' 5"'$  ([id number] m), weight 70.3 kg, SpO2 97 %.  Medical Problem List and Plan: 1.  R hemiplegia secondary to Metastatic disease from melanoma s/p L frontal crani 05/01/21  -Continue CIR therapies including PT, OT, and SLP  2.  Age indeterminate DVT in right popliteal vein, and in bilateral posterior tibial and peroneal veins. Chronic DVT in right common femoral and femoral veins. Given age indeterminate DVTs and history of ICH  Dr. Mohamed/NS  recommended prophylactic Lovenox- no IVC filter- resumption of Eliquis in a week or two--- plan to d/w Dr. Kathyrn Sheriff this week pending repeat dopplers  -pt with increased swelling LE's--ordered dopplers/pending             -antiplatelet therapy: N/A 3. Pain Management: Tylenol prn.  4. Mood: LCSW to follow for evaluation and support.              -antipsychotic agents: N/A 5. Neuropsych: This patient is capable of making decisions on her own behalf. 6. Skin/Wound Care: Staples removed    8/17 -foam dressing to foot--likely edema related   -elevating LE when resting 7. Fluids/Electrolytes/Nutrition: encourage PO  -intake excellent   -protein supp for albumin 8. Paroxymal A fib:/HTN: Monitor BP tid--continue metoprolol daily.             --Off Eliquis due to hemorrhage  HR Controlled on 8/16 9.  Glaucoma: Has been stable on timoptic--will resume.  10. Pre-diabetes: Hgb A1C-6.1. Diet liberalized to regular per patient/family request.             - monitor FBS weekly 11.  Leucocytosis:              --no fevers or other signs of infection.  WBCs 15.8 on 8/8--14.5 8/15  Cont to monitor 12. Hyponatremia: Sodium up to 135 on 8/8  Cont to monitor  13. Urinary retention:   -continue urecholine at 50 mg tid  -continue flomax at night. bp is soft but holding  -OOB to toilet/bsc to void  8/15 added prn xanax for  voiding associated anxiety --helped quite a bit!  8/17 UCX with 100k GNR   -add bactrim pending sens 14. Constipation:   - scheduled miralax bid along with senna-s  Improving  LOS: 20 days A FACE TO FACE EVALUATION WAS PERFORMED  [name] 05/23/2021, 8:21 AM

## 2021-05-23 NOTE — Progress Notes (Signed)
Occupational Therapy Session Note  Patient Details  Name: Angela Carpenter MRN: [id number] Date of Birth: 1941/04/25  Today's Date: 05/23/2021 OT Individual Time: [phone] OT Individual Time Calculation (min): 60 min    Short Term Goals: Week 3:  OT Short Term Goal 1 (Week 3): STG= LTG d/t ELOS OT Short Term Goal 1 - Progress (Week 3): Progressing toward goal  Skilled Therapeutic Interventions/Progress Updates:  Pt greeted supine in bed with RN present providing pt with morning meds. Pt agreeable to OT intervention. Pt completed bed mobility with MIN A needing assist to maneuver RLE to EOB, althugh pt reports that's the most shes been able to maneuver RLE to EOB. Pt completed stand pivot transfer from EOB>w/c with Rw and MIN A, mutiple attempts needed to come into full stand d/t posterior lean. Pt transported to toilet from w/c, MIN A to pivot to 3n1 over toilet using grab bar. Pt able to lower pants with RUE supported on arm rest and LUE managing clothing, OTA providing MIN A for standing balance. Pt completed grooming tasks at sink with set- up assist for washing her face and supervision for oral care. Pt transported to day room with total A. Remainder of session to focus on functional sit<>stands from w/c with pt completed x5 sit<>stands with Rw and MIN A. Focused on using momentum to power into  standing and anterior weight shifting. Added in component of simulating posterior pericare in standing with pt removing wah cloths tucked into pts gait belt with pt using RUE as supporting and LUE completing GM tasks. Pt completed tasks with MIN A for standing balance. Pt transported back to room with total A, where pt completed stand pivot trnasfe from w/c>recliner with Rw and MIN A. Pt left seated in recliner with safety belt activated and all needs within reach.   Therapy Documentation Precautions:  Precautions Precautions: Fall Precaution Comments: R hemiparesis Required Braces or Orthoses:  Sling Restrictions Weight Bearing Restrictions: No RLE Weight Bearing: Weight bearing as tolerated   Pain: Pt reports no pain during session.     Therapy/Group: Individual Therapy  [facility] 05/23/2021, 11:57 AM

## 2021-05-23 NOTE — Progress Notes (Signed)
Bilateral lower extremity venous duplex has been completed. Preliminary results can be found in CV Proc through chart review.  Results were given to the patient's nurse, Gwenlyn Perking.  05/23/21 12:56 PM Angela Carpenter RVT

## 2021-05-24 ENCOUNTER — Ambulatory Visit: Payer: Medicare Other | Admitting: Radiation Oncology

## 2021-05-24 ENCOUNTER — Institutional Professional Consult (permissible substitution): Payer: Self-pay | Admitting: Radiation Oncology

## 2021-05-24 LAB — URINE CULTURE: Culture: >=100000 — AB

## 2021-05-24 MED ORDER — DEXAMETHASONE 2 MG PO TABS
1.0000 mg | ORAL_TABLET | Freq: Every day | ORAL | Status: DC
  Administered 2021-05-25 – 2021-05-28 (×4): 1 mg via ORAL
  Filled 2021-05-24 (×4): qty 1

## 2021-05-24 NOTE — Progress Notes (Signed)
Physical Therapy Session Note  Patient Details  Name: Angela Carpenter MRN: [id number] Date of Birth: 08/12/1941  Today's Date: 05/24/2021 PT Individual Time: 0910-1005 PT Individual Time Calculation (min): 55 min   Short Term Goals: Week 2:  PT Short Term Goal 1 (Week 2): supine to side to sit w/cga only PT Short Term Goal 2 (Week 2): pt will propel wc using hemi technique x 168f w/additional time and supervision PT Short Term Goal 3 (Week 2): Patient will perform basic transfers with min A consistently. PT Short Term Goal 4 (Week 2): Patient will ambulate >75 feet with mod A using LRAD.  Skilled Therapeutic Interventions/Progress Updates:     Patient in recliner with her SIL, CMarlowe Kays in the room upon PT arrival. Patient alert and agreeable to PT session. Patient denied pain during session.  Therapeutic Activity: Transfers: Patient performed sit to/from stand from the recliner with min A with x2 attempts, appropriately self-initiated by patient reporting, " my set up was wrong." She performed sit to stand with the RW following NMR, see below with CGA-close supervision using RW from a low mat table Provided min verbal cues for forward weight shift and hand placement x1.  Gait Training:  Patient ambulated >120 feet using RW x2 with CGA. Ambulated with shoes on first trial with intermittent toe catching on R without LOB and in non-skid socks on second trial with mild foot drag with fatigue. Continues to lack R hip flexor and hamstring activation to clear R foot consistently during swing. Provided min verbal cues for looking ahead, increased L weight shift, and leading with R heel to promote increased step height and length. Changed footwear with total A for energy/time management between gait trials.  Neuromuscular Re-ed: Patient performed the following activities for lower extremity motor control for increased independence and safety with functional activities: -sit to/from stand x4 without  AD focused on forward weight shift and balance throughout transfers with min A progressing to CGA -Step taps on R to 4" step x10 with mod-max A for foot placement -Step taps on R 2" step x10 with minA-CGA for R foot placement onto step and max A for bringing foot back to the floor  Patient in recliner in the room with her SIL and B legs elevated at end of session with breaks locked, chair alarm set, and all needs within reach.   Therapy Documentation Precautions:  Precautions Precautions: Fall Precaution Comments: R hemiparesis Required Braces or Orthoses: Sling Restrictions Weight Bearing Restrictions: No RLE Weight Bearing: Weight bearing as tolerated    Therapy/Group: Individual Therapy  Sofya Moustafa L Jarris Kortz PT, DPT  05/24/2021, 5:03 PM

## 2021-05-24 NOTE — Progress Notes (Signed)
Occupational Therapy Session Note  Patient Details  Name: Angela Carpenter MRN: [id number] Date of Birth: 1941-05-04  Today's Date: 05/24/2021 OT Individual Time: 0700-0800 OT Individual Time Calculation (min): 60 min   Today's Date: 05/24/2021 OT Individual Time: 1300-1345 OT Individual Time Calculation (min): 45 min   Short Term Goals: Week 1:  OT Short Term Goal 1 (Week 1): Pt will don shirt wiht MOD A OT Short Term Goal 1 - Progress (Week 1): Met OT Short Term Goal 2 (Week 1): Pt will recall hemi strategies wiht MIN VC OT Short Term Goal 2 - Progress (Week 1): Met OT Short Term Goal 3 (Week 1): Pt will thread 1LE into pants OT Short Term Goal 3 - Progress (Week 1): Met OT Short Term Goal 4 (Week 1): Pt will groom in unsupported sitting wiht MIN A OT Short Term Goal 4 - Progress (Week 1): Met  Skilled Therapeutic Interventions/Progress Updates:     Pt received in bed agreeable to OT with no pain reported ADL:  Pt completes bathing with S overall for lateral leans and LHSS to wash BLE Pt completes UB dressing with superviison with VC for threading head second. Pt completes LB dressing with use of reacher and MIN A for power up into standing Pt completes footwear with total A to don teds and shoes d/t time constraints Pt completes toileting with MIN A for standing balance only Pt completes toileting transfer with MIN A to cross threshold into bathroom and CGA all other times Pt completes shower/Tub transfer with CGA at ambulatory level   Pt left at end of session in bed with exit alarm on, call light in reach and all needs met  Session 2:  Pt received in recliner with no pain reported  ADL:  Pt completes toileitng and toilet transfers with MIN A Overall for faciliaiton of weight shift and use of RW. VC for strategies to position Les prior to STS. Pt ableot complete all components of toileting with CGA-S. Blocked practice of posterior method walk in shower transfer with MOD A  for lifting RLE over 4" threshold. MIN A fo rsit to stand with grab bar on R.  Pt left at end of session in recliner with exit alarm on, call light in reach and all needs met   Therapy Documentation Precautions:  Precautions Precautions: Fall Precaution Comments: R hemiparesis Required Braces or Orthoses: Sling Restrictions Weight Bearing Restrictions: No RLE Weight Bearing: Weight bearing as tolerated General:   Vital Signs: Therapy Vitals Temp: 97.6 F (36.4 C) Temp Source: Oral Pulse Rate: 79 Resp: 18 BP: (!) 103/53 Patient Position (if appropriate): Lying Oxygen Therapy SpO2: 100 % O2 Device: Room Air Pain:   ADL: ADL Grooming: Moderate assistance Where Assessed-Grooming: Edge of bed Upper Body Bathing: Moderate assistance Where Assessed-Upper Body Bathing: Edge of bed Lower Body Bathing: Maximal assistance Where Assessed-Lower Body Bathing: Edge of bed Upper Body Dressing: Maximal assistance Where Assessed-Upper Body Dressing: Edge of bed Lower Body Dressing: Maximal assistance Where Assessed-Lower Body Dressing: Edge of bed Toileting: Maximal assistance Where Assessed-Toileting: Bedside Commode (stedy) Toilet Transfer: Maximal verbal cueing Toilet Transfer Method: Other (comment) (stedy) Tub/Shower Transfer: Not assessed Vision   Perception    Praxis   Exercises:   Other Treatments:     Therapy/Group: Individual Therapy  Tonny Branch 05/24/2021, 6:50 AM

## 2021-05-24 NOTE — Progress Notes (Signed)
Speech Language Pathology Weekly Progress and Session Note  Patient Details  Name: Angela Carpenter MRN: [id number] Date of Birth: 09/16/1941  Beginning of progress report period: May 17, 2021 End of progress report period: May 24, 2021  Today's Date: 05/24/2021 SLP Individual Time: 1040-1120 SLP Individual Time Calculation (min): 40 min  Short Term Goals: Week 3: SLP Short Term Goal 1 (Week 3): STGs=LTGs due to ELOS SLP Short Term Goal 1 - Progress (Week 3): Progressing toward goal    New Short Term Goals: Week 3: SLP Short Term Goal 1 (Week 3): STGs=LTGs due to ELOS SLP Short Term Goal 1 - Progress (Week 3): Progressing toward goal  Weekly Progress Updates: Patient continues to make functional gains towards meeting all LTGs. Patient's discharged date has been extended to maximize family education prior to discharge home. Currently, patient is overall Mod I for word-finding and requires overall Supervision-Min A verbal cues for recall of functional information. Patient and family education ongoing. Patient would benefit from continued skilled SLP intervention to maximize her cognitive functioning prior to discharge.      Intensity: Minumum of 1-2 x/day, 30 to 90 minutes Frequency: 1 to 3 out of 7 days Duration/Length of Stay: 05/29/21 Treatment/Interventions: Cognitive remediation/compensation;Environmental controls;Multimodal communication approach;Speech/Language facilitation;Cueing hierarchy;Functional tasks;Therapeutic Activities;Internal/external aids;Patient/family education   Daily Session  Skilled Therapeutic Interventions:  Skilled treatment session focused on cognitive goals. SLP administered the [facility] Mental Status Examination (SLUMS). Patient scored  26/30 points with a score of 27 or above considered normal. Patient continues to demonstrate deficits in short-term recall, however, improvements with recall of functional information have been noted  throughout sessions.  SLP also facilitating conversation regarding assistance with mobility at night once home due to current need to void frequently throughout the night. Patient independently spoke to physician regarding adjusting times for medications but also discussed need for family member to be close by as well as way to request assistance while sleeping.  Patient left upright in recliner with alarm on and all needs within reach. Continue with current plan of care.        Pain No/Denies Pain   Therapy/Group: Individual Therapy  Graylee Arutyunyan 05/24/2021, 6:15 AM

## 2021-05-24 NOTE — Progress Notes (Signed)
PROGRESS NOTE   Subjective/Complaints: Asked if her lasix timing could be changed to reduce bladder emptying at night. Still able to void completely.   ROS: Patient denies fever, rash, sore throat, blurred vision, nausea, vomiting, diarrhea, cough, shortness of breath or chest pain,   headache, or mood change.    Objective:   VAS [place] LOWER EXTREMITY VENOUS (DVT)  Result Date: 05/23/2021  Lower Venous DVT Study Patient Name:  Angela Carpenter  Date of Exam:   05/23/2021 Medical Rec #: [id number]      Accession #:    [id number] Date of Birth: 10-18-1940     Patient Gender: F Patient Age:   51 years Exam Location:  [facility] Procedure:      VAS [place] LOWER EXTREMITY VENOUS (DVT) Referring Phys: Alger Simons --------------------------------------------------------------------------------  Indications: F/U DVT.  Risk Factors: DVT. Comparison Study: 05/05/2021 - RIGHT:                   - Findings consistent with age indeterminate deep vein                   thrombosis involving the right popliteal vein, right posterior                   tibial veins, and right peroneal veins.                   - Findings consistent with chronic deep vein thrombosis                   involving the right common femoral vein, and right femoral                   vein.                    LEFT:                   - Findings consistent with age indeterminate deep vein                   thrombosis                   involving the left posterior tibial veins, and left peroneal                   veins. Performing Technologist: Oliver Hum RVT  Examination Guidelines: A complete evaluation includes B-mode imaging, spectral Doppler, color Doppler, and power Doppler as needed of all accessible portions of each vessel. Bilateral testing is considered an integral part of a complete examination. Limited examinations for reoccurring indications may be performed as noted. The reflux  portion of the exam is performed with the patient in reverse Trendelenburg.  +---------+---------------+---------+-----------+----------+--------------+ RIGHT    CompressibilityPhasicitySpontaneityPropertiesThrombus Aging +---------+---------------+---------+-----------+----------+--------------+ CFV      Full           Yes      Yes                                 +---------+---------------+---------+-----------+----------+--------------+ SFJ  Full                                                        +---------+---------------+---------+-----------+----------+--------------+ FV Prox  Full                                                        +---------+---------------+---------+-----------+----------+--------------+ FV Mid   Full                                                        +---------+---------------+---------+-----------+----------+--------------+ FV DistalFull                                                        +---------+---------------+---------+-----------+----------+--------------+ PFV      Full                                                        +---------+---------------+---------+-----------+----------+--------------+ POP      Partial        Yes      Yes                  Chronic        +---------+---------------+---------+-----------+----------+--------------+ PTV      Full                                                        +---------+---------------+---------+-----------+----------+--------------+ PERO     Full                                                        +---------+---------------+---------+-----------+----------+--------------+   +---------+---------------+---------+-----------+----------+--------------+ LEFT     CompressibilityPhasicitySpontaneityPropertiesThrombus Aging +---------+---------------+---------+-----------+----------+--------------+ CFV      Full           Yes      Yes                                  +---------+---------------+---------+-----------+----------+--------------+ SFJ      Full                                                        +---------+---------------+---------+-----------+----------+--------------+  FV Prox  Full                                                        +---------+---------------+---------+-----------+----------+--------------+ FV Mid   Full                                                        +---------+---------------+---------+-----------+----------+--------------+ FV DistalFull                                                        +---------+---------------+---------+-----------+----------+--------------+ PFV      Full                                                        +---------+---------------+---------+-----------+----------+--------------+ POP      Full           Yes      Yes                                 +---------+---------------+---------+-----------+----------+--------------+ PTV      Full                                                        +---------+---------------+---------+-----------+----------+--------------+ PERO     Full                                                        +---------+---------------+---------+-----------+----------+--------------+     Summary: RIGHT: - Findings consistent with chronic deep vein thrombosis involving the right popliteal vein. - No cystic structure found in the popliteal fossa.  LEFT: - There is no evidence of deep vein thrombosis in the lower extremity.  - No cystic structure found in the popliteal fossa.  *See table(s) above for measurements and observations. Electronically signed by Harold Barban MD on 05/23/2021 at 10:39:26 PM.    Final    No results for input(s): WBC, HGB, HCT, PLT in the last 72 hours.    No results for input(s): NA, K, CL, CO2, GLUCOSE, BUN, CREATININE, CALCIUM in the last 72 hours.     Intake/Output  Summary (Last 24 hours) at 05/24/2021 1151 Last data filed at 05/24/2021 0900 Gross per 24 hour  Intake 720 ml  Output --  Net 720 ml        Physical Exam: Vital Signs Blood pressure 107/61, pulse 83, temperature (!) 97.4 F (36.3 C), temperature source Oral, resp. rate 18, height 5'  5" (1.651 m), weight 70.3 kg, SpO2 98 %. Constitutional: No distress . Vital signs reviewed. HEENT: NCAT, EOMI, oral membranes moist Neck: supple Cardiovascular: RRR without murmur. No JVD    Respiratory/Chest: CTA Bilaterally without wheezes or rales. Normal effort    GI/Abdomen: BS +, non-tender, non-distended Ext: no clubbing, cyanosis, tr edema with TED Psych: pleasant and cooperative  Skin: Warm and dry. Blisters on feet. Scalp wound healing with scabs decreasing Musc: No tenderness in bilateral extremities. Neuro: Alert Motor: RUE: Proximally tr/5, hand grip 3+/5--no change RLE: Proximally 0/5, ankle dorsiflexion 1+/5, no change  Assessment/Plan: 1. Functional deficits which require 3+ hours per day of interdisciplinary therapy in a comprehensive inpatient rehab setting. Physiatrist is providing close team supervision and 24 hour management of active medical problems listed below. Physiatrist and rehab team continue to assess barriers to discharge/monitor patient progress toward functional and medical goals  Care Tool:  Bathing    Body parts bathed by patient: Right arm, Chest, Abdomen, Right upper leg, Left upper leg, Face, Front perineal area, Buttocks, Right lower leg, Left lower leg, Left arm   Body parts bathed by helper: Left arm, Buttocks, Front perineal area, Right lower leg, Left lower leg Body parts n/a: Front perineal area   Bathing assist Assist Level: Minimal Assistance - Patient > 75%     Upper Body Dressing/Undressing Upper body dressing   What is the patient wearing?: Pull over shirt Orthosis activity level: Performed by helper  Upper body assist Assist Level: Minimal  Assistance - Patient > 75%    Lower Body Dressing/Undressing Lower body dressing      What is the patient wearing?: Underwear/pull up, Pants     Lower body assist Assist for lower body dressing: Minimal Assistance - Patient > 75%     Toileting Toileting    Toileting assist Assist for toileting: Minimal Assistance - Patient > 75%     Transfers Chair/bed transfer  Transfers assist     Chair/bed transfer assist level: Minimal Assistance - Patient > 75% Chair/bed transfer assistive device: Programmer, multimedia   Ambulation assist   Ambulation activity did not occur: Safety/medical concerns  Assist level: Contact Guard/Touching assist Assistive device: Walker-rolling Max distance: 241 ft   Walk 10 feet activity   Assist  Walk 10 feet activity did not occur: Safety/medical concerns  Assist level: Contact Guard/Touching assist Assistive device: Walker-rolling   Walk 50 feet activity   Assist Walk 50 feet with 2 turns activity did not occur: Safety/medical concerns  Assist level: Contact Guard/Touching assist Assistive device: Walker-rolling    Walk 150 feet activity   Assist Walk 150 feet activity did not occur: Safety/medical concerns  Assist level: Contact Guard/Touching assist Assistive device: Walker-rolling    Walk 10 feet on uneven surface  activity   Assist Walk 10 feet on uneven surfaces activity did not occur: Safety/medical concerns         Wheelchair     Assist Will patient use wheelchair at discharge?: Yes Type of Wheelchair: Manual    Wheelchair assist level: Supervision/Verbal cueing Max wheelchair distance: 100 ft    Wheelchair 50 feet with 2 turns activity    Assist        Assist Level: Supervision/Verbal cueing   Wheelchair 150 feet activity     Assist      Assist Level: Maximal Assistance - Patient 25 - 49%   Blood pressure 107/61, pulse 83, temperature (!) 97.4 F (36.3 C),  temperature source  Oral, resp. rate 18, height '5\' 5"'$  (1.651 m), weight 70.3 kg, SpO2 98 %.  Medical Problem List and Plan: 1.  R hemiplegia secondary to Metastatic disease from melanoma s/p L frontal crani 05/01/21  -Continue CIR therapies including PT, OT, and SLP   -toe cap ordered to help with RLE clearance 2.  Age indeterminate DVT in right popliteal vein, and in bilateral posterior tibial and peroneal veins. Chronic DVT in right common femoral and femoral veins. Given age indeterminate DVTs and history of ICH  Dr. Mohamed/NS  recommended prophylactic Lovenox- no IVC filter- resumption of Eliquis in a week or two--- plan to d/w Dr. Kathyrn Sheriff this week pending repeat dopplers  -pt with increased swelling LE's--f/u dopplers actually demonstrate resolution of dvt's except for "chronic" right popliteal thrombus. Will reach out to oncology but plan on holding a/c at this point              -antiplatelet therapy: N/A 3. Pain Management: Tylenol prn.  4. Mood: LCSW to follow for evaluation and support.              -antipsychotic agents: N/A 5. Neuropsych: This patient is capable of making decisions on her own behalf. 6. Skin/Wound Care: Staples removed    8/18 -foam dressing to foot--likely edema related   -elevating LE when resting with some improvement 7. Fluids/Electrolytes/Nutrition: encourage PO  -intake excellent   -protein supp for albumin 8. Paroxymal A fib:/HTN: Monitor BP tid--continue metoprolol daily.             --Off Eliquis due to hemorrhage  HR Controlled on 8/16 9. Glaucoma: Has been stable on timoptic--will resume.  10. Pre-diabetes: Hgb A1C-6.1. Diet liberalized to regular per patient/family request.             - monitor FBS weekly 11.  Leucocytosis:              --no fevers or other signs of infection.  WBCs 15.8 on 8/8--14.5 8/15  Cont to monitor 12. Hyponatremia: Sodium up to 135 on 8/8  Cont to monitor  13. Urinary retention:   -continue urecholine at 50 mg  tid  -continue flomax at night. bp is soft but holding  -OOB to toilet/bsc to void  8/15 added prn xanax for voiding associated anxiety --helped quite a bit!  8/18 UCX with 100k E coli   -continue bactrim x 5 days 14. Constipation:   - scheduled miralax bid along with senna-s  Improved  LOS: 21 days A FACE TO FACE EVALUATION WAS PERFORMED  [name] 05/24/2021, 11:51 AM

## 2021-05-24 NOTE — Progress Notes (Signed)
Orthopedic Tech Progress Note Patient Details:  Angela Carpenter 1940/10/25 [id number] Toe Cap has been ordered from [place] Patient ID: [name], female   DOB: [birth year], 80 y.o.   MRN: [id number]  Jearld Lesch 05/24/2021, 5:50 PM

## 2021-05-24 NOTE — Progress Notes (Signed)
Recreational Therapy Discharge Summary Patient Details  Name: Angela Carpenter MRN: [id number] Date of Birth: September 03, 1941 Today's Date: 05/24/2021  Long term goals set: 1  Long term goals met: 1  Comments on progress toward goals: Pt referred by team for stress management/coping strategies.  TR sessions focused on leisure eduction including activity analysis, identifying potential modifications and stress management.  Goal met.  Reasons goals not met: n/a  Equipment acquired: n/a  Reasons for discharge: treatment goals met   Patient/family agrees with progress made and goals achieved: Yes  Lira Stephen 05/24/2021, 12:31 PM

## 2021-05-25 MED ORDER — BETHANECHOL CHLORIDE 25 MG PO TABS
25.0000 mg | ORAL_TABLET | Freq: Three times a day (TID) | ORAL | Status: DC
  Administered 2021-05-25 – 2021-05-27 (×6): 25 mg via ORAL
  Filled 2021-05-25 (×6): qty 1

## 2021-05-25 NOTE — Progress Notes (Addendum)
PROGRESS NOTE   Subjective/Complaints: No new problems. Voided a little less frequently last night. No new pain.   ROS: Patient denies fever, rash, sore throat, blurred vision, nausea, vomiting, diarrhea, cough, shortness of breath or chest pain, joint or back pain, headache, or mood change.   Objective:   VAS [place] LOWER EXTREMITY VENOUS (DVT)  Result Date: 05/23/2021  Lower Venous DVT Study Patient Name:  Angela Carpenter  Date of Exam:   05/23/2021 Medical Rec #: [id number]      Accession #:    [id number] Date of Birth: 02/25/1941     Patient Gender: F Patient Age:   80 years Exam Location:  [facility] Procedure:      VAS [place] LOWER EXTREMITY VENOUS (DVT) Referring Phys: Alger Simons --------------------------------------------------------------------------------  Indications: F/U DVT.  Risk Factors: DVT. Comparison Study: 05/05/2021 - RIGHT:                   - Findings consistent with age indeterminate deep vein                   thrombosis involving the right popliteal vein, right posterior                   tibial veins, and right peroneal veins.                   - Findings consistent with chronic deep vein thrombosis                   involving the right common femoral vein, and right femoral                   vein.                    LEFT:                   - Findings consistent with age indeterminate deep vein                   thrombosis                   involving the left posterior tibial veins, and left peroneal                   veins. Performing Technologist: Oliver Hum RVT  Examination Guidelines: A complete evaluation includes B-mode imaging, spectral Doppler, color Doppler, and power Doppler as needed of all accessible portions of each vessel. Bilateral testing is considered an integral part of a complete examination. Limited examinations for reoccurring indications may be performed as noted. The reflux portion of the exam  is performed with the patient in reverse Trendelenburg.  +---------+---------------+---------+-----------+----------+--------------+ RIGHT    CompressibilityPhasicitySpontaneityPropertiesThrombus Aging +---------+---------------+---------+-----------+----------+--------------+ CFV      Full           Yes      Yes                                 +---------+---------------+---------+-----------+----------+--------------+ SFJ  Full                                                        +---------+---------------+---------+-----------+----------+--------------+ FV Prox  Full                                                        +---------+---------------+---------+-----------+----------+--------------+ FV Mid   Full                                                        +---------+---------------+---------+-----------+----------+--------------+ FV DistalFull                                                        +---------+---------------+---------+-----------+----------+--------------+ PFV      Full                                                        +---------+---------------+---------+-----------+----------+--------------+ POP      Partial        Yes      Yes                  Chronic        +---------+---------------+---------+-----------+----------+--------------+ PTV      Full                                                        +---------+---------------+---------+-----------+----------+--------------+ PERO     Full                                                        +---------+---------------+---------+-----------+----------+--------------+   +---------+---------------+---------+-----------+----------+--------------+ LEFT     CompressibilityPhasicitySpontaneityPropertiesThrombus Aging +---------+---------------+---------+-----------+----------+--------------+ CFV      Full           Yes      Yes                                  +---------+---------------+---------+-----------+----------+--------------+ SFJ      Full                                                        +---------+---------------+---------+-----------+----------+--------------+  FV Prox  Full                                                        +---------+---------------+---------+-----------+----------+--------------+ FV Mid   Full                                                        +---------+---------------+---------+-----------+----------+--------------+ FV DistalFull                                                        +---------+---------------+---------+-----------+----------+--------------+ PFV      Full                                                        +---------+---------------+---------+-----------+----------+--------------+ POP      Full           Yes      Yes                                 +---------+---------------+---------+-----------+----------+--------------+ PTV      Full                                                        +---------+---------------+---------+-----------+----------+--------------+ PERO     Full                                                        +---------+---------------+---------+-----------+----------+--------------+     Summary: RIGHT: - Findings consistent with chronic deep vein thrombosis involving the right popliteal vein. - No cystic structure found in the popliteal fossa.  LEFT: - There is no evidence of deep vein thrombosis in the lower extremity.  - No cystic structure found in the popliteal fossa.  *See table(s) above for measurements and observations. Electronically signed by Harold Barban MD on 05/23/2021 at 10:39:26 PM.    Final    No results for input(s): WBC, HGB, HCT, PLT in the last 72 hours.    No results for input(s): NA, K, CL, CO2, GLUCOSE, BUN, CREATININE, CALCIUM in the last 72 hours.     Intake/Output Summary (Last 24  hours) at 05/25/2021 1001 Last data filed at 05/25/2021 0820 Gross per 24 hour  Intake 720 ml  Output --  Net 720 ml        Physical Exam: Vital Signs Blood pressure (!) 108/56, pulse 86, temperature 98 F (36.7 C), temperature source Oral, resp. rate 18, height 5'  5" (1.651 m), weight 70.3 kg, SpO2 99 %. Constitutional: No distress . Vital signs reviewed. HEENT: NCAT, EOMI, oral membranes moist Neck: supple Cardiovascular: RRR without murmur. No JVD    Respiratory/Chest: CTA Bilaterally without wheezes or rales. Normal effort    GI/Abdomen: BS +, non-tender, non-distended Ext: no clubbing, cyanosis, tr LE edema Psych: pleasant and cooperative  Skin: Warm and dry. Blisters on feet broken/resolving. Scalp wound healing with scabs decreasing Musc: No tenderness in bilateral extremities. Neuro: Alert Motor: RUE: Proximally 1/5, wrist 2+/5,hand grip 3+/5--  RLE: Proximally 1/5, 2/5 KE, ankle dorsiflexion 2-/5  Assessment/Plan: 1. Functional deficits which require 3+ hours per day of interdisciplinary therapy in a comprehensive inpatient rehab setting. Physiatrist is providing close team supervision and 24 hour management of active medical problems listed below. Physiatrist and rehab team continue to assess barriers to discharge/monitor patient progress toward functional and medical goals  Care Tool:  Bathing    Body parts bathed by patient: Right arm, Chest, Abdomen, Right upper leg, Left upper leg, Face, Front perineal area, Buttocks, Right lower leg, Left lower leg, Left arm   Body parts bathed by helper: Left arm, Buttocks, Front perineal area, Right lower leg, Left lower leg Body parts n/a: Front perineal area   Bathing assist Assist Level: Minimal Assistance - Patient > 75%     Upper Body Dressing/Undressing Upper body dressing   What is the patient wearing?: Pull over shirt Orthosis activity level: Performed by helper  Upper body assist Assist Level: Minimal Assistance  - Patient > 75%    Lower Body Dressing/Undressing Lower body dressing      What is the patient wearing?: Underwear/pull up, Pants     Lower body assist Assist for lower body dressing: Minimal Assistance - Patient > 75%     Toileting Toileting    Toileting assist Assist for toileting: Minimal Assistance - Patient > 75%     Transfers Chair/bed transfer  Transfers assist     Chair/bed transfer assist level: Minimal Assistance - Patient > 75% Chair/bed transfer assistive device: Programmer, multimedia   Ambulation assist   Ambulation activity did not occur: Safety/medical concerns  Assist level: Contact Guard/Touching assist Assistive device: Walker-rolling Max distance: 241 ft   Walk 10 feet activity   Assist  Walk 10 feet activity did not occur: Safety/medical concerns  Assist level: Contact Guard/Touching assist Assistive device: Walker-rolling   Walk 50 feet activity   Assist Walk 50 feet with 2 turns activity did not occur: Safety/medical concerns  Assist level: Contact Guard/Touching assist Assistive device: Walker-rolling    Walk 150 feet activity   Assist Walk 150 feet activity did not occur: Safety/medical concerns  Assist level: Contact Guard/Touching assist Assistive device: Walker-rolling    Walk 10 feet on uneven surface  activity   Assist Walk 10 feet on uneven surfaces activity did not occur: Safety/medical concerns         Wheelchair     Assist Will patient use wheelchair at discharge?: Yes Type of Wheelchair: Manual    Wheelchair assist level: Supervision/Verbal cueing Max wheelchair distance: 100 ft    Wheelchair 50 feet with 2 turns activity    Assist        Assist Level: Supervision/Verbal cueing   Wheelchair 150 feet activity     Assist      Assist Level: Maximal Assistance - Patient 25 - 49%   Blood pressure (!) 108/56, pulse 86, temperature 98 F (36.7 C), temperature source  Oral, resp. rate 18, height '5\' 5"'$  (1.651 m), weight 70.3 kg, SpO2 99 %.  Medical Problem List and Plan: 1.  R hemiplegia secondary to Metastatic disease from melanoma s/p L frontal crani 05/01/21  -Continue CIR therapies including PT, OT, and SLP   -toe cap ordered to help with RLE clearance 2.  Age indeterminate DVT in right popliteal vein, and in bilateral posterior tibial and peroneal veins. Chronic DVT in right common femoral and femoral veins. Given age indeterminate DVTs and history of ICH     -f/u dopplers actually demonstrate resolution of dvt's except for "chronic" right popliteal thrombus. Connected with oncology who agrees with holding a/c at this time. No IVCF either             -antiplatelet therapy: N/A 3. Pain Management: Tylenol prn.  4. Mood: LCSW to follow for evaluation and support.              -antipsychotic agents: N/A 5. Neuropsych: This patient is capable of making decisions on her own behalf. 6. Skin/Wound Care: Staples removed    8/19 -foam dressing to foot--blisters improving  -elevating LE when resting with some improvement 7. Fluids/Electrolytes/Nutrition: encourage PO  -intake excellent   -protein supp for albumin 8. Paroxymal A fib:/HTN: Monitor BP tid--continue metoprolol daily.             --Off Eliquis due to hemorrhage  HR Controlled on 8/16 9. Glaucoma: Has been stable on timoptic--will resume.  10. Pre-diabetes: Hgb A1C-6.1. Diet liberalized to regular per patient/family request.             - monitor FBS weekly 11.  Leucocytosis:              --no fevers or other signs of infection. Likely steroid related  WBCs 15.8 on 8/8--14.5 8/15  Tapering decadron to off 12. Hyponatremia: Sodium up to 135 on 8/8  Cont to monitor  13. Urinary retention:   - urecholine-->decrease to '25mg'$  tid  -continue flomax at night. bp is soft but holding  -OOB to toilet/bsc to void  8/15 added prn xanax for voiding associated anxiety --helped quite a bit!  8/18 UCX with  100k E coli   -continue bactrim x 5 days total 14. Constipation:   - scheduled miralax bid along with senna-s  Improved  LOS: 22 days A FACE TO [place] 05/25/2021, 10:01 AM

## 2021-05-25 NOTE — Progress Notes (Signed)
Speech Language Pathology Discharge Summary  Patient Details  Name: LUIZA CARRANCO MRN: [id number] Date of Birth: 06-28-1941  Patient has met 5 of 5 long term goals.  Patient to discharge at overall Supervision level.   Reasons goals not met: N/A   Clinical Impression/Discharge Summary: Patient has made excellent gains and has met 5 of 5 LTGs this admission. Currently, patient is overall Mod I for word-finding at the conversation level and comprehension of complex and abstract language. Patient also requires overall supervision level verbal cues and extra time to complete functional and complex tasks safely in regards to problem solving, short-term recall with use of strategies and attention. Patient and family education is complete and patient will discharge home with 24 hour supervision from family. Patient would benefit from f/u SLP services to maximize her cognitive functioning and overall functional independence in order to reduce caregiver burden.   Care Partner:  Caregiver Able to Provide Assistance: Yes  Type of Caregiver Assistance: Physical;Cognitive  Recommendation:  Home Health SLP;24 hour supervision/assistance  Rationale for SLP Follow Up: Maximize cognitive function and independence;Reduce caregiver burden   Equipment: N/A   Reasons for discharge: Treatment goals met;Discharged from hospital   Patient/Family Agrees with Progress Made and Goals Achieved: Yes    [place], [place] 05/25/2021, 6:07 AM

## 2021-05-25 NOTE — Progress Notes (Signed)
Physical Therapy Session Note  Patient Details  Name: Angela Carpenter MRN: [id number] Date of Birth: [birth year]  Today's Date: 05/25/2021 PT Individual Time: 0800-0900 + 1000-1030 PT Individual Time Calculation (min): 60 min + 30 min  Short Term Goals: Week 3:  PT Short Term Goal 1 (Week 3): STG = LTG due to ELOS  Skilled Therapeutic Interventions/Progress Updates:     1st session: Pt seated in recliner to start session, has BLE elevated for edema management. Pt agreeable to PT tx with no reports of pain. Donned shoes and knee-high TED's with totalA for time. Sit<>stand with minA for powering to rise and ambulatory transfer with RW and minA for steadying to w/c. W/c transport for time management to main rehab gym where she completed stand<>pivot transfer with minA and RW to mat table - increased difficulty stepping backwards with RLE during transfers. Worked on seated hamstring curls on RLE with pillow case to reduce friction - unable to achieve active knee flexion >90 deg - provided AAROM for NMR 2x15 reps. Next, worked on repeated sit<>stands from lowered mat table height to RW - required spacer b/w her feet to avoid narrow BOS - able to complete 2x4 reps - supervision to start, fading to modA with fatigue. Next, worked on gait trial on level surfaces with 2 turns included, CGA provided for balance and RW for UE support, ambulated 125f - intermittent toe drag on R foot with self correcting steps (did not apply shoe cover). No knee buckling or LOB present. Emphasized safety approach to sitting surface to get closer to chair prior to turning as she has such difficulty with stepping backwards with R foot. Next, continued to work on strengthening RLE and stepping strategies - worked on lateral stepping L<>R along mat table ([id number] L, [id number] R), with minA for balance and modA for managing RLE to step sideways. Difficulty stepping in both directions (L & R) and increased fear of falling during this  activity as well. Explained purpose to carryover in to functional transfers. Stand<>pivot transfer with minA and RW back to her w/c and returned to her room. MD present for morning rounds and observed functional ambulatory transfer with minA and RW back to her recliner. Elevated BLE and pt made comfortable at end of session, all needs in reach.  2nd session: Pt sitting in recliner to start session, agreeable to PT tx. No reports of pain. Requires assist for scooting forwards at recliner and minA for powering to rise to RW. Ambulatory transfer with minA and RW to w/c with cues for safety approach and increasing L step length. Wheelchair transport to day room rehab gym for time. Stand<>pivot transfer with minA and RW to Nustep. Required assist for setup. Completed 7.5 minutes at workload of 4, using both BUE and BLE - knee vaglus noted and c/o pain with assist in knee alignment - reports premorbid. Stand<>pivot transfer with minA and RW back to her w/c and returned to her room. Pt requesting to use restroom. NT called and handoff of care to NT due to time constraints. All needs met.  Therapy Documentation Precautions:  Precautions Precautions: Fall Precaution Comments: R hemiparesis Required Braces or Orthoses: Sling Restrictions Weight Bearing Restrictions: No RLE Weight Bearing: Weight bearing as tolerated General:    Therapy/Group: Individual Therapy  Deunta Beneke P Raymondo Garcialopez PT 05/25/2021, 7:44 AM

## 2021-05-25 NOTE — Progress Notes (Signed)
Physical Therapy Session Note  Patient Details  Name: Angela Carpenter MRN: [id number] Date of Birth: [birth year]  Today's Date: 05/25/2021 PT Individual Time: 0800-0900 PT Individual Time Calculation (min): 60 min   Short Term Goals: Week 3:  PT Short Term Goal 1 (Week 3): STG = LTG due to ELOS  Skilled Therapeutic Interventions/Progress Updates: Pt presents sitting in recliner w/ Les elevated.  Pt agreeable to therapy.  B shoes donned w/ Total A for time conservation.  Pt wheeled to outside near [facility].  Pt performed multiple sit to stand transfers after assisted to scoot to front of w/c.  Pt requires min A for RLE/hip although shifts weight.  Pt requires cueing for forward lean and maintenance to transfer  w/ min A, although occasionally requires >1 attempt before achieving.  Pt amb multiple trials outside up to 50' including turns.  Pt states fatigued this PM and noted scuffing of R foot.  Pt cued on posture and weight shift w/ improvement w/ swing of RLE, but then returns to same.  Pt required increased assist for sit to stand from outside bench 2/2 height.  Pt practiced eccentric control and positioning for stand to sit w/ improvement noted, although still has min "plop".  Pt amb 120' indoors to room w/ CGA and verbal cues for RLE step length and foot clearance.  Pt returned to recliner and LEs elevated.  Chair alarm on and all needs in reach.       Therapy Documentation Precautions:  Precautions Precautions: Fall Precaution Comments: R hemiparesis Required Braces or Orthoses: Sling Restrictions Weight Bearing Restrictions: No RLE Weight Bearing: Weight bearing as tolerated General:   Vital Signs:  Pain:  0/10       Therapy/Group: Individual Therapy  Ladoris Gene 05/25/2021, 1:50 PM

## 2021-05-25 NOTE — Progress Notes (Signed)
Occupational Therapy Session Note  Patient Details  Name: Angela Carpenter MRN: [id number] Date of Birth: 12/20/1940  Today's Date: 05/25/2021 OT Individual Time: 1100-1200 OT Individual Time Calculation (min): 60 min    Short Term Goals: Week 2:  OT Short Term Goal 1 (Week 2): Pt will complete UB dressing with MIN A OT Short Term Goal 1 - Progress (Week 2): Met OT Short Term Goal 2 (Week 2): Pt will thread BLE into pants with A for LE positioning only PRN OT Short Term Goal 2 - Progress (Week 2): Progressing toward goal OT Short Term Goal 3 (Week 2): Pt will transfer consistently to toilet with MOD A and LRAD OT Short Term Goal 3 - Progress (Week 2): Met OT Short Term Goal 4 (Week 2): Pt will recall adapative bathing strategies with MIN cuing and supervision OT Short Term Goal 4 - Progress (Week 2): Met Week 3:  OT Short Term Goal 1 (Week 3): STG= LTG d/t ELOS OT Short Term Goal 1 - Progress (Week 3): Progressing toward goal  Skilled Therapeutic Interventions/Progress Updates:     Pt received in recliner agreeable to OT with no pain reported.    ADL: Pt completes LB dressing with use of dressing stick and CGA to advance pants past hips. Pt able to doff shoes with S but requires MOD A to don shoes.   Therapeutic exercise Sidelying and supine therex with mod facilitation for full ROM Hip flexion on powder board and against gravity in supine Hamstring curl in sidelying Hip extension on powder board  [place]o Stim One 330 pulse width 35 Hz pulse rate On 8 sec/ off 8 sec Ramp up/ down 2 sec Symmetrical Biphasic wave form  Max intensity 13m at 500 Ohm load Deltoid stim with no skin breakdown for 60 min unattended   Pt left at end of session in recliner with exit alarm on, call light in reach and all needs met   Therapy Documentation Precautions:  Precautions Precautions: Fall Precaution Comments: R hemiparesis Required Braces or Orthoses: Sling Restrictions Weight Bearing  Restrictions: No RLE Weight Bearing: Weight bearing as tolerated General:   Vital Signs: Therapy Vitals Temp: 98.5 F (36.9 C) Temp Source: Oral Pulse Rate: 81 Resp: 18 BP: (!) 102/52 Patient Position (if appropriate): Lying Oxygen Therapy SpO2: 100 % O2 Device: Room Air Pain:   ADL: ADL Grooming: Moderate assistance Where Assessed-Grooming: Edge of bed Upper Body Bathing: Moderate assistance Where Assessed-Upper Body Bathing: Edge of bed Lower Body Bathing: Maximal assistance Where Assessed-Lower Body Bathing: Edge of bed Upper Body Dressing: Maximal assistance Where Assessed-Upper Body Dressing: Edge of bed Lower Body Dressing: Maximal assistance Where Assessed-Lower Body Dressing: Edge of bed Toileting: Maximal assistance Where Assessed-Toileting: Bedside Commode (stedy) Toilet Transfer: Maximal verbal cueing Toilet Transfer Method: Other (comment) (stedy) Tub/Shower Transfer: Not assessed Vision   Perception    Praxis   Exercises:   Other Treatments:     Therapy/Group: Individual Therapy  STonny Branch8/19/2022, 6:57 AM

## 2021-05-26 NOTE — Progress Notes (Signed)
Occupational Therapy Session Note  Patient Details  Name: Angela Carpenter MRN: [id number] Date of Birth: [birth year]  Today's Date: 05/26/2021 OT Individual Time: 0900-1000 OT Individual Time Calculation (min): 60 min    Short Term Goals: Week 1:  OT Short Term Goal 1 (Week 1): Pt will don shirt wiht MOD A OT Short Term Goal 1 - Progress (Week 1): Met OT Short Term Goal 2 (Week 1): Pt will recall hemi strategies wiht MIN VC OT Short Term Goal 2 - Progress (Week 1): Met OT Short Term Goal 3 (Week 1): Pt will thread 1LE into pants OT Short Term Goal 3 - Progress (Week 1): Met OT Short Term Goal 4 (Week 1): Pt will groom in unsupported sitting wiht MIN A OT Short Term Goal 4 - Progress (Week 1): Met  Skilled Therapeutic Interventions/Progress Updates:     Pt received in recliner with no pain reported.  ADL:  Pt completes grooming with HOH A to squeeze last bit of toothpaste out of continer with RUE Pt completes UB dressing with S to doff and don but 1 VC for each for hemi strategy  Neuromuscular Reeducation Functional reaching tasks with large wooden pegs placed into slots with facilitation of of scapula and shoulder flexion. Functiona reach of utensils on tabletop with similar facilitation as above with focus on hand to mouth excursion and them placing into a container Functional scoop with spoon with RUE of small dice out of container and bringing to mouth>transfering dice into another container. Pt needs A to position spoon in correct grasp, however able to maintain with MIN A and good wrist movement for scooping.   [place] Stim One 330 pulse width 35 Hz pulse rate On 8 sec/ off 8 sec Ramp up/ down 2 sec Symmetrical Biphasic wave form  Max intensity 173m at 500 Ohm load Deltoid 30 min incorporated into session and 30 unattended  Pt left at end of session in recliner with exit alarm on, call light in reach and all needs met   Therapy Documentation Precautions:   Precautions Precautions: Fall Precaution Comments: R hemiparesis Required Braces or Orthoses: Sling Restrictions Weight Bearing Restrictions: No RLE Weight Bearing: Weight bearing as tolerated General:   Vital Signs: Therapy Vitals Temp: 98.1 F (36.7 C) Temp Source: Oral Pulse Rate: 73 Resp: 18 BP: (!) 105/59 Patient Position (if appropriate): Lying Oxygen Therapy SpO2: 98 % O2 Device: Room Air Pain:   ADL: ADL Grooming: Moderate assistance Where Assessed-Grooming: Edge of bed Upper Body Bathing: Moderate assistance Where Assessed-Upper Body Bathing: Edge of bed Lower Body Bathing: Maximal assistance Where Assessed-Lower Body Bathing: Edge of bed Upper Body Dressing: Maximal assistance Where Assessed-Upper Body Dressing: Edge of bed Lower Body Dressing: Maximal assistance Where Assessed-Lower Body Dressing: Edge of bed Toileting: Maximal assistance Where Assessed-Toileting: Bedside Commode (stedy) Toilet Transfer: Maximal verbal cueing Toilet Transfer Method: Other (comment) (stedy) Tub/Shower Transfer: Not assessed Vision   Perception    Praxis   Exercises:   Other Treatments:     Therapy/Group: Individual Therapy  STonny Branch8/20/2022, 6:47 AM

## 2021-05-26 NOTE — Progress Notes (Signed)
Physical Therapy Session Note  Patient Details  Name: Angela Carpenter MRN: [id number] Date of Birth: Oct 05, 1941  Today's Date: 05/26/2021 PT Individual Time: 1400-1500 PT Individual Time Calculation (min): 60 min  Short Term Goals: Week 2:  PT Short Term Goal 1 (Week 2): supine to side to sit w/cga only PT Short Term Goal 2 (Week 2): pt will propel wc using hemi technique x 159f w/additional time and supervision PT Short Term Goal 3 (Week 2): Patient will perform basic transfers with min A consistently. PT Short Term Goal 4 (Week 2): Patient will ambulate >75 feet with mod A using LRAD.  Skilled Therapeutic Interventions/Progress Updates:     Patient in recliner upon PT arrival. Patient alert and agreeable to PT session. Patient denied pain during session. Reports continued disrupted sleep from nighttime voiding. Reports that she needed 2 attempts to be successful with emptying her bladder last night and some anxiety when first trial was not successful. Reminded patient that she has Xanax available PRN to help with anxiety related to toileting, patient was unaware she needed to ask for it.  Discussed d/c planning, patient feeling confident in going home and looking forward to "sleeping in my own bed."   Therapeutic Activity: Transfers: Patient performed sit to/from stand x3 with min A using RW requiring 2-3 trials before coming to standing. Increased assist and trials due to increased fatigue today. Educated on fluctuations in performance due to fatigue and other factors and energy conservation techniques to manage fatigue levels. Provided intermittent verbal cues for scooting forward, forward weight shift, and hand placement.  Gait Training:  Patient ambulated 242 feet x2 using RW with 1 sitting rest break on first trial to retie  shoe laces for safety with CGA. Ambulated with decreased gait speed, step height and step length R>L, demonstrates R toe out with overuse of adductors during limb  advancement and decreased hip flexor and hamstring activation on R, forward trunk flexion and downward head gaze. Deviations more prominent with fatigue. Provided intermittent verbal cues for erect posture, leading with her R heel to improve R foot clearance and step length, B gluteal activation in stance, and looking ahead.  Patient ascended/descended 4x6" steps using B rails with min A for balance, R foot placement and step clearance, and R hand placement x2 only (patient able to adjust her hand independently unless needing to lift her hand off the rail). Performed step to gait pattern leading with L while ascending and R while descending. Provided cues for technique and sequencing.   Patient in recliner with B lower extremities elevated at end of session with breaks locked, chair alarm set, and all needs within reach.   Therapy Documentation Precautions:  Precautions Precautions: Fall Precaution Comments: R hemiparesis Required Braces or Orthoses: Sling Restrictions Weight Bearing Restrictions: No RLE Weight Bearing: Weight bearing as tolerated    Therapy/Group: Individual Therapy  Antaeus Karel L Francene Mcerlean PT, DPT  05/26/2021, 6:33 AM

## 2021-05-27 MED ORDER — BETHANECHOL CHLORIDE 10 MG PO TABS
10.0000 mg | ORAL_TABLET | Freq: Three times a day (TID) | ORAL | Status: DC
  Administered 2021-05-27 (×2): 10 mg via ORAL
  Filled 2021-05-27 (×3): qty 1

## 2021-05-27 NOTE — Progress Notes (Signed)
Physical Therapy Session Note  Patient Details  Name: Angela Carpenter MRN: [id number] Date of Birth: [birth year]  Today's Date: 05/27/2021 PT Individual Time: 0800-0900 PT Individual Time Calculation (min): 60 min   Short Term Goals: Week 3:  PT Short Term Goal 1 (Week 3): STG = LTG due to ELOS  Skilled Therapeutic Interventions/Progress Updates:     Patient in recliner in the room upon PT arrival. Patient alert and agreeable to PT session. Patient denied pain during session.  Therapeutic Activity: Bed Mobility: Patient performed rolling R/L with supervision and supine to/from sit with min A for R lower extremity management with use of bed rails. Provided verbal cues for hip adduction and knee flexion to bring R leg off the bed and attempted following cues for scooping R leg with L to bring legs onto the bed, but patient unable to clear her lower legs onto the bed with this technique. Transfers: Patient performed sit to/from stand from recliner, w/c, and hospital bed with CGA-min A for forward weight shift and R foot placement due to R extensor tone producing posterior bias. Provided verbal cues for scooting forward, hand placement, foot placement, and forward weight shift. Patient performed a simulated low SUV height car transfer with CGA using RW. Provided cues for safe technique.  Gait Training:  Patient ambulated >150 feet x2 using RW with CGA in non-skid socks. Ambulated with decreased gait speed, step height and step length R>L, demonstrates R toe out with overuse of adductors during limb advancement and decreased hip flexor and hamstring activation on R, forward trunk flexion and downward head gaze. Improved R foot clearance in non-skid socks compared to yesterday's session in tennis shoes. Deviations more prominent with fatigue. Provided intermittent verbal cues for erect posture, leading with her R heel to improve R foot clearance and step length, B gluteal activation in stance, and  looking ahead.   Wheelchair Mobility:  Patient propelled wheelchair >150 feet with supervision using L hemi-technique. Provided verbal cues for steering and use of L hand to increase speed and reduce fatigue with propulsion. Donned shoes for propulsion and discussed having slide on shoes with good gip to self-propel w/c in the home on days with increased fatigue and decreased safety with transfers and gait. Patient in agreement.   Therapeutic Exercise: Patient performed the following exercises with verbal and tactile cues for proper technique. -bridging x5 with PT holding R foot to prevent sliding forward -R heel slides x10 with 25-50% assist for hip/knee flexion activation  Educated on exercises for HEP, will provide handout at later time.   Patient in recliner at end of session with breaks locked, chair alarm set, and all needs within reach.   Therapy Documentation Precautions:  Precautions Precautions: Fall Precaution Comments: R hemiparesis Required Braces or Orthoses: Sling Restrictions Weight Bearing Restrictions: No RLE Weight Bearing: Weight bearing as tolerated    Therapy/Group: Individual Therapy  Lajune Perine L Maelle Sheaffer PT, DPT  05/27/2021, 4:58 PM

## 2021-05-27 NOTE — Progress Notes (Signed)
Occupational Therapy Discharge Summary  Patient Details  Name: Angela Carpenter MRN: [id number] Date of Birth: Jan 27, 1941    Patient has met 12 of 12 long term goals due to improved activity tolerance, improved balance, postural control, ability to compensate for deficits, functional use of  RIGHT upper and RIGHT lower extremity, improved attention, improved awareness, and improved coordination.  Patient to discharge at overall Supervision- MIN A level.  Patient's care partner is independent to provide the necessary physical and cognitive assistance at discharge.    Reasons goals not met: NA  Recommendation:  Patient will benefit from ongoing skilled OT services in home health setting to continue to advance functional skills in the area of BADL and iADL.  Equipment: No equipment provided  Reasons for discharge: treatment goals met and discharge from hospital  Patient/family agrees with progress made and goals achieved: Yes  OT Discharge Precautions/Restrictions  Precautions Precautions: Fall Precaution Comments: R hemiparesis Restrictions Weight Bearing Restrictions: No RLE Weight Bearing: Weight bearing as tolerated Pain  No pain ADL ADL Grooming: Setup Where Assessed-Grooming: Sitting at sink Upper Body Bathing: Setup Where Assessed-Upper Body Bathing: Shower Lower Body Bathing: Supervision/safety Where Assessed-Lower Body Bathing: Shower Upper Body Dressing: Supervision/safety Where Assessed-Upper Body Dressing: Sitting at sink Lower Body Dressing: Contact guard Where Assessed-Lower Body Dressing: Standing at sink, Sitting at sink Toileting: Contact guard Where Assessed-Toileting: Bedside Commode Toilet Transfer: Contact guard Toilet Transfer Method: Other (comment) (stedy) Science writer: Bedside commode, Grab bars Tub/Shower Transfer: Not assessed Social research officer, government: Minimal assistance Social research officer, government Method: Production assistant, radio: Civil engineer, contracting with back Vision Baseline Vision/History: Wears glasses Patient Visual Report: No change from baseline Vision Assessment?: No apparent visual deficits Perception  Perception: Within Functional Limits Praxis Praxis: Impaired Praxis Impairment Details: Motor planning Cognition Overall Cognitive Status: Impaired/Different from baseline Arousal/Alertness: Awake/alert Attention: Sustained;Alternating Focused Attention: Appears intact Sustained Attention: Appears intact Alternating Attention: Appears intact Memory: Impaired Problem Solving: Appears intact Reasoning: Impaired Organizing: Appears intact Decision Making: Appears intact Safety/Judgment: Appears intact Sensation Sensation Light Touch: Appears Intact Hot/Cold: Not tested Proprioception: Appears Intact Coordination Gross Motor Movements are Fluid and Coordinated: No Fine Motor Movements are Fluid and Coordinated: No Coordination and Movement Description: R hemiplegia improved but still unable to complete formal testing d/t shoudler deficits Motor  Motor Motor: Hemiplegia Motor - Skilled Clinical Observations: R hemi Mobility  Transfers Sit to Stand: Contact Guard/Touching assist Stand to Sit: Contact Guard/Touching assist  Trunk/Postural Assessment  Cervical Assessment Cervical Assessment: Exceptions to [facility] Thoracic Assessment Thoracic Assessment: Exceptions to [facility] Lumbar Assessment Lumbar Assessment: Exceptions to [facility] Postural Control Postural Control: Deficits on evaluation Trunk Control: improved since eval Righting Reactions: still insufficient R Protective Responses: insuffient R  Balance Dynamic Sitting Balance Dynamic Sitting - Level of Assistance: 6: Modified independent (Device/Increase time) Static Standing Balance Static Standing - Level of Assistance: 5: Stand by assistance Dynamic Standing Balance Dynamic Standing - Level of Assistance: 4: Min assist Extremity/Trunk  Assessment RUE Assessment RUE Assessment: Exceptions to [facility] Brunstrum level for arm: Stage III Synergy is performed voluntarily Brunstrum level for hand: Stage IV Movements deviating from synergies LUE Assessment LUE Assessment: Within Functional Limits   Tonny Branch 05/27/2021, 12:33 PM

## 2021-05-27 NOTE — Progress Notes (Signed)
PROGRESS NOTE   Subjective/Complaints: Doing well this morning had therapy already.  Brother at bedside.  No complaints  Asking for handicapped sticker  ROS: Patient denies chest pain shortness of breath nausea vomiting diarrhea or constipation.   Objective:   No results found. No results for input(s): WBC, HGB, HCT, PLT in the last 72 hours.    No results for input(s): NA, K, CL, CO2, GLUCOSE, BUN, CREATININE, CALCIUM in the last 72 hours.     Intake/Output Summary (Last 24 hours) at 05/27/2021 1121 Last data filed at 05/27/2021 1000 Gross per 24 hour  Intake 702 ml  Output --  Net 702 ml         Physical Exam: Vital Signs Blood pressure (!) 101/54, pulse 75, temperature 98.1 F (36.7 C), temperature source Oral, resp. rate 18, height '5\' 5"'$  (1.651 m), weight 70.3 kg, SpO2 100 %.   General: No acute distress Mood and affect are appropriate Heart: Regular rate and rhythm no rubs murmurs or extra sounds Lungs: Clear to auscultation, breathing unlabored, no rales or wheezes Abdomen: Positive bowel sounds, soft nontender to palpation, nondistended Extremities: No clubbing, cyanosis, or edema    Skin: Warm and dry. Blisters on feet broken/resolving. Scalp wound healing with scabs decreasing Musc: No tenderness in bilateral extremities. Neuro: Alert Motor: RUE: Proximally 1/5, wrist 2+/5,hand grip 3+/5--  RLE: Proximally 1/5, 2/5 KE, ankle dorsiflexion 2-/5  Assessment/Plan: 1. Functional deficits which require 3+ hours per day of interdisciplinary therapy in a comprehensive inpatient rehab setting. Physiatrist is providing close team supervision and 24 hour management of active medical problems listed below. Physiatrist and rehab team continue to assess barriers to discharge/monitor patient progress toward functional and medical goals  Care Tool:  Bathing    Body parts bathed by patient: Right arm, Chest,  Abdomen, Right upper leg, Left upper leg, Face, Front perineal area, Buttocks, Right lower leg, Left lower leg, Left arm   Body parts bathed by helper: Left arm, Buttocks, Front perineal area, Right lower leg, Left lower leg Body parts n/a: Front perineal area   Bathing assist Assist Level: Supervision/Verbal cueing     Upper Body Dressing/Undressing Upper body dressing   What is the patient wearing?: Pull over shirt Orthosis activity level: Performed by helper  Upper body assist Assist Level: Supervision/Verbal cueing    Lower Body Dressing/Undressing Lower body dressing      What is the patient wearing?: Underwear/pull up, Pants     Lower body assist Assist for lower body dressing: Supervision/Verbal cueing     Toileting Toileting    Toileting assist Assist for toileting: Contact Guard/Touching assist     Transfers Chair/bed transfer  Transfers assist     Chair/bed transfer assist level: Minimal Assistance - Patient > 75% Chair/bed transfer assistive device: Programmer, multimedia   Ambulation assist   Ambulation activity did not occur: Safety/medical concerns  Assist level: Contact Guard/Touching assist Assistive device: Walker-rolling Max distance: 120   Walk 10 feet activity   Assist  Walk 10 feet activity did not occur: Safety/medical concerns  Assist level: Contact Guard/Touching assist Assistive device: Walker-rolling   Walk 50 feet activity  Assist Walk 50 feet with 2 turns activity did not occur: Safety/medical concerns  Assist level: Contact Guard/Touching assist Assistive device: Walker-rolling    Walk 150 feet activity   Assist Walk 150 feet activity did not occur: Safety/medical concerns  Assist level: Contact Guard/Touching assist Assistive device: Walker-rolling    Walk 10 feet on uneven surface  activity   Assist Walk 10 feet on uneven surfaces activity did not occur: Safety/medical concerns   Assist level:  Minimal Assistance - Patient > 75% Assistive device: Aeronautical engineer Will patient use wheelchair at discharge?: Yes Type of Wheelchair: Manual    Wheelchair assist level: Supervision/Verbal cueing Max wheelchair distance: 100 ft    Wheelchair 50 feet with 2 turns activity    Assist        Assist Level: Supervision/Verbal cueing   Wheelchair 150 feet activity     Assist      Assist Level: Maximal Assistance - Patient 25 - 49%   Blood pressure (!) 101/54, pulse 75, temperature 98.1 F (36.7 C), temperature source Oral, resp. rate 18, height '5\' 5"'$  (1.651 m), weight 70.3 kg, SpO2 100 %.  Medical Problem List and Plan: 1.  R hemiplegia secondary to Metastatic disease from melanoma s/p L frontal crani 05/01/21  -Continue CIR therapies including PT, OT, and SLP   -toe cap ordered to help with RLE clearance 2.  Age indeterminate DVT in right popliteal vein, and in bilateral posterior tibial and peroneal veins. Chronic DVT in right common femoral and femoral veins. Given age indeterminate DVTs and history of ICH     -f/u dopplers actually demonstrate resolution of dvt's except for "chronic" right popliteal thrombus. Connected with oncology who agrees with holding a/c at this time. No IVCF either             -antiplatelet therapy: N/A 3. Pain Management: Tylenol prn.  4. Mood: LCSW to follow for evaluation and support.              -antipsychotic agents: N/A 5. Neuropsych: This patient is capable of making decisions on her own behalf. 6. Skin/Wound Care: Staples removed    8/19 -foam dressing to foot--blisters improving  -elevating LE when resting with some improvement 7. Fluids/Electrolytes/Nutrition: encourage PO  -intake excellent   -protein supp for albumin 8. Paroxymal A fib:/HTN: Monitor BP tid--continue metoprolol daily.             --Off Eliquis due to hemorrhage  HR Controlled on 8/16 9. Glaucoma: Has been stable on timoptic--will  resume.  10. Pre-diabetes: Hgb A1C-6.1. Diet liberalized to regular per patient/family request.             - monitor FBS weekly 11.  Leucocytosis:              --no fevers or other signs of infection. Likely steroid related  WBCs 15.8 on 8/8--14.5 8/15  Tapering decadron to off 12. Hyponatremia: Sodium up to 135 on 8/8  Cont to monitor  13. Urinary retention:   - urecholine-->decrease to '25mg'$  tid, still voiding well will reduce to 10 mg 3 times daily  Continue Flomax  8/18 UCX with 100k E coli   -continue bactrim x 5 days total 14. Constipation:   - scheduled miralax bid along with senna-s  Improved  LOS: 24 days A FACE TO FACE EVALUATION WAS PERFORMED  Angela Carpenter 05/27/2021, 11:21 AM

## 2021-05-27 NOTE — Progress Notes (Signed)
Occupational Therapy Session Note  Patient Details  Name: Angela Carpenter MRN: [id number] Date of Birth: [birth year]  Today's Date: 05/27/2021 OT Individual Time: 1000-1059 OT Individual Time Calculation (min): 59 min    Short Term Goals: Week 1:  OT Short Term Goal 1 (Week 1): Pt will don shirt wiht MOD A OT Short Term Goal 1 - Progress (Week 1): Met OT Short Term Goal 2 (Week 1): Pt will recall hemi strategies wiht MIN VC OT Short Term Goal 2 - Progress (Week 1): Met OT Short Term Goal 3 (Week 1): Pt will thread 1LE into pants OT Short Term Goal 3 - Progress (Week 1): Met OT Short Term Goal 4 (Week 1): Pt will groom in unsupported sitting wiht MIN A OT Short Term Goal 4 - Progress (Week 1): Met  Skilled Therapeutic Interventions/Progress Updates:     Pt received in recliner with no pain reported  ADL:  Pt completes bathing with supervision seated using lateral leans and LHSS to wash back and buttocks Pt completes UB dressing with S and 1 VC for order of donning shirt Pt completes LB dressing with CGA at sit to stand level using BUE nad dressing stick to doff and don clothing Pt completes footwear with superviison to doff and don non skid socks Pt completes toileting with CGA in stance for clothing managment Pt completes toileting transfer with with CGA for RW use to transfer into bathroom Pt completes shower/Tub transfer with CGA for stand pivot transfer into level entry shower. Pt requires MIN A For posterior method entry with RW for RLE management over ledge.  Pt left at end of session in bed with exit alarm on, call light in reach and all needs met   Therapy Documentation Precautions:  Precautions Precautions: Fall Precaution Comments: R hemiparesis Required Braces or Orthoses: Sling Restrictions Weight Bearing Restrictions: No RLE Weight Bearing: Weight bearing as tolerated General:   Vital Signs: Therapy Vitals Temp: 98.1 F (36.7 C) Temp Source: Oral Pulse  Rate: 75 Resp: 18 BP: (!) 101/54 Patient Position (if appropriate): Lying Oxygen Therapy SpO2: 100 % O2 Device: Room Air Pain:   ADL: ADL Grooming: Moderate assistance Where Assessed-Grooming: Edge of bed Upper Body Bathing: Moderate assistance Where Assessed-Upper Body Bathing: Edge of bed Lower Body Bathing: Maximal assistance Where Assessed-Lower Body Bathing: Edge of bed Upper Body Dressing: Maximal assistance Where Assessed-Upper Body Dressing: Edge of bed Lower Body Dressing: Maximal assistance Where Assessed-Lower Body Dressing: Edge of bed Toileting: Maximal assistance Where Assessed-Toileting: Bedside Commode (stedy) Toilet Transfer: Maximal verbal cueing Toilet Transfer Method: Other (comment) (stedy) Tub/Shower Transfer: Not assessed Vision   Perception    Praxis   Exercises:   Other Treatments:     Therapy/Group: Individual Therapy  Tonny Branch 05/27/2021, 6:50 AM

## 2021-05-27 NOTE — Progress Notes (Signed)
Physical Therapy Discharge Summary  Patient Details  Name: Angela Carpenter MRN: [id number] Date of Birth: 1941-03-31  Today's Date: 05/27/2021   Patient has met 8 of 10 long term goals due to improved activity tolerance, improved balance, improved postural control, increased strength, increased range of motion, ability to compensate for deficits, and functional use of  right upper extremity and right lower extremity.  Patient to discharge at an ambulatory level [place].   Patient's care partner is independent to provide the necessary physical assistance at discharge.  Reasons goals not met: Patient continues to require min A for sit to stand transfers and bed mobility. Patient's family has demonstrated this level of assist and feels confident in performing this at home with the patient.    Recommendation:  Patient will benefit from ongoing skilled PT services in home health setting to continue to advance safe functional mobility, address ongoing impairments in balance, R side strength/coordination, motor planning, activity tolerance, gait and stair training, functional mobility, community integration, patient caregiver education, and minimize fall risk.  Equipment: 18"x16" light weight wheelchair  Reasons for discharge: treatment goals met  Patient/family agrees with progress made and goals achieved: Yes  PT Discharge Precautions/Restrictions Precautions Precautions: Fall Precaution Comments: R hemiparesis Restrictions Weight Bearing Restrictions: No RLE Weight Bearing: Weight bearing as tolerated Vision/Perception  Vision - Assessment Additional Comments: Reports eye straining and mild blurred vision with reading with current prescription, states this is baseline for her Perception Perception: Within Functional Limits Praxis Praxis: Impaired Praxis Impairment Details: Motor planning  Cognition Overall Cognitive Status: Impaired/Different from baseline Arousal/Alertness:  Awake/alert Attention: Sustained;Alternating Focused Attention: Appears intact Sustained Attention: Appears intact Alternating Attention: Appears intact Memory: Impaired Problem Solving: Appears intact Reasoning: Impaired Organizing: Appears intact Decision Making: Appears intact Safety/Judgment: Appears intact Sensation Sensation Light Touch: Impaired Detail Light Touch Impaired Details: Impaired LLE;Impaired RLE (reports numness/tingling in plantar surface of feet, absent sensation in L great toe) Hot/Cold: Not tested Proprioception: Appears Intact Coordination Gross Motor Movements are Fluid and Coordinated: No Fine Motor Movements are Fluid and Coordinated: No Coordination and Movement Description: R hemiplegia improved but still unable to complete formal testing d/t shoudler deficits Heel Shin Test: unable R due to hip flexor weakness Motor  Motor Motor: Hemiplegia Motor - Skilled Clinical Observations: R hemi  Mobility Bed Mobility Bed Mobility: Rolling Right;Rolling Left;Supine to Sit;Sit to Supine Rolling Right: Supervision/verbal cueing Rolling Left: Supervision/Verbal cueing Supine to Sit: Minimal Assistance - Patient > 75% Sit to Supine: Minimal Assistance - Patient > 75% Transfers Sit to Stand: Contact Guard/Touching assist;Minimal Assistance - Patient > 75% Stand to Sit: Contact Guard/Touching assist Stand Pivot Transfers: Contact Guard/Touching assist Transfer (Assistive device): Rolling walker Locomotion  Gait Ambulation: Yes Gait Assistance: Contact Guard/Touching assist Gait Distance (Feet): 300 Feet Assistive device: Rolling walker Gait Gait: Yes Gait Pattern: Decreased step length - right;Decreased stride length;Decreased hip/knee flexion - right;Decreased weight shift to right;Left foot flat;Lateral trunk lean to right;Decreased trunk rotation;Lateral hip instability;Trunk flexed;Trunk rotated posteriorly on right;Poor foot clearance - left Gait  velocity: decreased Stairs / Additional Locomotion Stairs: Yes Stairs Assistance: Minimal Assistance - Patient > 75% Stair Management Technique: Two rails Number of Stairs: 12 Height of Stairs: 6 Ramp: Contact Guard/touching assist (with RW) Wheelchair Mobility Wheelchair Mobility: Yes Wheelchair Assistance: Supervision/Verbal cueing Wheelchair Propulsion: Left upper extremity;Left lower extremity Wheelchair Parts Management: Needs assistance Distance: >150 ft  Trunk/Postural Assessment  Cervical Assessment Cervical Assessment: Exceptions to [facility] Thoracic Assessment Thoracic Assessment: Exceptions to [facility]  Lumbar Assessment Lumbar Assessment: Exceptions to [facility] Postural Control Postural Control: Deficits on evaluation Trunk Control: improved since eval Righting Reactions: still insufficient R Protective Responses: insuffient R  Balance Dynamic Sitting Balance Dynamic Sitting - Level of Assistance: 6: Modified independent (Device/Increase time) Static Standing Balance Static Standing - Level of Assistance: 5: Stand by assistance Dynamic Standing Balance Dynamic Standing - Level of Assistance: 4: Min assist Extremity Assessment  RLE Assessment RLE Assessment: Exceptions to [facility] Passive Range of Motion (PROM) Comments: tightness of R ankle gastroc/soleus, able to achieve just past neutral with AROM and over pressure RLE Strength RLE Overall Strength: Deficits Right Hip Flexion: 2-/5 Right Hip ABduction: 2/5 Right Hip ADduction: 3/5 Right Knee Flexion: 2-/5 Right Knee Extension: 4/5 Right Ankle Dorsiflexion: 4/5 Right Ankle Plantar Flexion: 4/5 RLE Tone RLE Tone: Mild;Hypertonic Hypertonic Details: min R extensor tone, increased with fatigue LLE Assessment LLE Assessment: Within Functional Limits Active Range of Motion (AROM) Comments: WFL for functional mobility General Strength Comments: Grossly 5/5 throughout in sitting    Boaz Berisha L Matan Steen PT, DPT  05/27/2021,  1:00 PM

## 2021-05-28 LAB — BASIC METABOLIC PANEL
Anion gap: 7 (ref 5–15)
BUN: 18 mg/dL (ref 8–23)
CO2: 25 mmol/L (ref 22–32)
Calcium: 8.7 mg/dL — ABNORMAL LOW (ref 8.9–10.3)
Chloride: 105 mmol/L (ref 98–111)
Creatinine, Ser: 0.56 mg/dL (ref 0.44–1.00)
GFR, Estimated: >60 mL/min (ref >60)
Glucose, Bld: 90 mg/dL (ref 70–99)
Potassium: 3.7 mmol/L (ref 3.5–5.1)
Sodium: 137 mmol/L (ref 135–145)

## 2021-05-28 LAB — CBC
HCT: 36.5 % (ref 36.0–46.0)
Hemoglobin: 11.8 g/dL — ABNORMAL LOW (ref 12.0–15.0)
MCH: 31.9 pg (ref 26.0–34.0)
MCHC: 32.3 g/dL (ref 30.0–36.0)
MCV: 98.6 fL (ref 80.0–100.0)
Platelets: 318 10*3/uL (ref 150–400)
RBC: 3.7 MIL/uL — ABNORMAL LOW (ref 3.87–5.11)
RDW: 15.2 % (ref 11.5–15.5)
WBC: 14.8 10*3/uL — ABNORMAL HIGH (ref 4.0–10.5)
nRBC: 0 % (ref 0.0–0.2)

## 2021-05-28 MED ORDER — BETHANECHOL CHLORIDE 25 MG PO TABS
50.0000 mg | ORAL_TABLET | Freq: Three times a day (TID) | ORAL | Status: DC
  Administered 2021-05-28 – 2021-05-29 (×4): 50 mg via ORAL
  Filled 2021-05-28 (×4): qty 2

## 2021-05-28 NOTE — Progress Notes (Signed)
Patient ID: Angela Carpenter, female   DOB: 07-23-1941, 80 y.o.   MRN: [id number]  SW sent HHPT/OT/SLP/SN/aide order to [facility]. SW provided pt with handicap placard. SW informed pt w/c cushion will be shipped to the home as [facility] out of stock of item.   SW met with pt, pt son and pt brother in room to provide handicap placard. A lot of questions related to medications. Encouraged them to ask attending or PA tomorrow; reiterated that there will be review on medications when reviewing discharge instructions.   [name], MSW, [place] Office: [phone] Cell: [phone] Fax: [phone]

## 2021-05-28 NOTE — Progress Notes (Signed)
Occupational Therapy Session Note  Patient Details  Name: Angela Carpenter MRN: [id number] Date of Birth: 1941/03/10  Today's Date: 05/28/2021 OT Individual Time: 1530-1625 OT Individual Time Calculation (min): 55 min    Short Term Goals: Week 2:  OT Short Term Goal 1 (Week 2): Pt will complete UB dressing with MIN A OT Short Term Goal 1 - Progress (Week 2): Met OT Short Term Goal 2 (Week 2): Pt will thread BLE into pants with A for LE positioning only PRN OT Short Term Goal 2 - Progress (Week 2): Progressing toward goal OT Short Term Goal 3 (Week 2): Pt will transfer consistently to toilet with MOD A and LRAD OT Short Term Goal 3 - Progress (Week 2): Met OT Short Term Goal 4 (Week 2): Pt will recall adapative bathing strategies with MIN cuing and supervision OT Short Term Goal 4 - Progress (Week 2): Met Week 3:  OT Short Term Goal 1 (Week 3): STG= LTG d/t ELOS OT Short Term Goal 1 - Progress (Week 3): Progressing toward goal Week 4:  OT Short Term Goal 1 (Week 4): STG=LTG d/t ELOS  Skilled Therapeutic Interventions/Progress Updates:    Pt greeted at time of session toileting with NT, hand off to OT to resume care. Pt toileting CGA overall for clothing management and hygiene prior to walking out to room. Ambulated bed <> gym CGA overall with RW and cues for clearing RLE. On mat, focused on RUE NMR with 1# dowel with mirror for feedback for the following: bicep curls, chest press, overhead press with Min facilitation at wrist/elbow to promote symmetry. 1x10 sit ups on large wedge and 1x10 sit ups on wedge + lightweight ball press for improved body awareness and BUE NMR. Ambulated back to room same as above, focused session on providing ideas for continued RUE NMR activities at home for max carryover. Up in recliner alarm on call bell in reach.   Therapy Documentation Precautions:  Precautions Precautions: Fall Precaution Comments: R hemiparesis Required Braces or Orthoses:  Sling Restrictions Weight Bearing Restrictions: No RLE Weight Bearing: Weight bearing as tolerated     Therapy/Group: Individual Therapy  [name] 05/28/2021, 12:55 PM

## 2021-05-28 NOTE — Progress Notes (Signed)
Physical Therapy Session Note  Patient Details  Name: Angela Carpenter MRN: [id number] Date of Birth: 10/15/1940  Today's Date: 05/28/2021 PT Individual Time: 0900-1000 PT Individual Time Calculation (min): 60 min   Short Term Goals: Week 1:  PT Short Term Goal 1 (Week 1): Pt will perform squat pivot transfer to/from wc to L side w/mod assist of 1 PT Short Term Goal 1 - Progress (Week 1): Met PT Short Term Goal 2 (Week 1): pt will propel wc using hemi technique x 112f w/additional time and supervision PT Short Term Goal 2 - Progress (Week 1): Progressing toward goal PT Short Term Goal 3 (Week 1): pt will stand statically w/LUE support and min assist x 2 min LRAD/use of RLE blocking/bracing as needed PT Short Term Goal 3 - Progress (Week 1): Met PT Short Term Goal 4 (Week 1): supine to side to sit w/cga only PT Short Term Goal 4 - Progress (Week 1): Progressing toward goal Week 2:  PT Short Term Goal 1 (Week 2): supine to side to sit w/cga only PT Short Term Goal 2 (Week 2): pt will propel wc using hemi technique x [id number]/additional time and supervision PT Short Term Goal 3 (Week 2): Patient will perform basic transfers with min A consistently. PT Short Term Goal 4 (Week 2): Patient will ambulate >75 feet with mod A using LRAD. Week 3:  PT Short Term Goal 1 (Week 3): STG = LTG due to ELOS  Skilled Therapeutic Interventions/Progress Updates:    pt received in bed and agreeable to therapy. No complaint of pain. Pt requested to use the bathroom. Ambulated to bathroom with RW and CGA. CGA for clothing management and hygiene. Continent bowel and bladder void. Car transfer with RW and CGA. Pt transferred to recliner in ADL apartment with CGA, required min A to stand d/t rocking. Ambulated x 12 ft on carpet to simulate home environment. Pt returned to room and performed ambulatory transfer to recliner with CGA and RW.   Therapy Documentation Precautions:  Precautions Precautions:  Fall Precaution Comments: R hemiparesis Required Braces or Orthoses: Sling Restrictions Weight Bearing Restrictions: No RLE Weight Bearing: Weight bearing as tolerated     Therapy/Group: Individual Therapy  OlMickel Fuchs/22/2022, 4:42 PM

## 2021-05-28 NOTE — Progress Notes (Addendum)
PROGRESS NOTE   Subjective/Complaints: Had to be cathed again this morning. Had a rough early morning. Says that staff was loud outside. Worried about baldder given that she's going home tomorrow  ROS: Patient denies fever, rash, sore throat, blurred vision, nausea, vomiting, diarrhea, cough, shortness of breath or chest pain, joint or back pain, headache .   Objective:   No results found. Recent Labs    05/28/21 0636  WBC 14.8*  HGB 11.8*  HCT 36.5  PLT 318      Recent Labs    05/28/21 0636  NA 137  K 3.7  CL 105  CO2 25  GLUCOSE 90  BUN 18  CREATININE 0.56  CALCIUM 8.7*       Intake/Output Summary (Last 24 hours) at 05/28/2021 [id number] Last data filed at 05/28/2021 0728 Gross per 24 hour  Intake 942 ml  Output 600 ml  Net 342 ml        Physical Exam: Vital Signs Blood pressure (!) 100/42, pulse 77, temperature 98.3 F (36.8 C), temperature source Oral, resp. rate 14, height '5\' 5"'$  (1.651 m), weight 70.3 kg, SpO2 99 %.   Constitutional: No distress . Vital signs reviewed. HEENT: NCAT, EOMI, oral membranes moist Neck: supple Cardiovascular: RRR without murmur. No JVD    Respiratory/Chest: CTA Bilaterally without wheezes or rales. Normal effort    GI/Abdomen: BS +, non-tender, non-distended Ext: no clubbing, cyanosis, or edema Psych: pleasant sl anxious Skin: Warm and dry. Blisters on feet broken/resolving. Scalp wound healing with scabs decreasing Musc: No tenderness in bilateral extremities. Neuro: Alert Motor: RUE: Proximally 1/5, wrist 2+/5,hand grip 3+/5--  RLE: Proximally 1/5, 2/5 KE, ankle dorsiflexion 2-/5  Assessment/Plan: 1. Functional deficits which require 3+ hours per day of interdisciplinary therapy in a comprehensive inpatient rehab setting. Physiatrist is providing close team supervision and 24 hour management of active medical problems listed below. Physiatrist and rehab team  continue to assess barriers to discharge/monitor patient progress toward functional and medical goals  Care Tool:  Bathing    Body parts bathed by patient: Right arm, Chest, Abdomen, Right upper leg, Left upper leg, Face, Front perineal area, Buttocks, Right lower leg, Left lower leg, Left arm   Body parts bathed by helper: Left arm, Buttocks, Front perineal area, Right lower leg, Left lower leg Body parts n/a: Front perineal area   Bathing assist Assist Level: Supervision/Verbal cueing     Upper Body Dressing/Undressing Upper body dressing   What is the patient wearing?: Pull over shirt Orthosis activity level: Performed by helper  Upper body assist Assist Level: Supervision/Verbal cueing    Lower Body Dressing/Undressing Lower body dressing      What is the patient wearing?: Underwear/pull up, Pants     Lower body assist Assist for lower body dressing: Supervision/Verbal cueing     Toileting Toileting    Toileting assist Assist for toileting: Contact Guard/Touching assist     Transfers Chair/bed transfer  Transfers assist     Chair/bed transfer assist level: Contact Guard/Touching assist Chair/bed transfer assistive device: Programmer, multimedia   Ambulation assist   Ambulation activity did not occur: Safety/medical concerns  Assist level: Contact  Guard/Touching assist Assistive device: Walker-rolling Max distance: 242 ft   Walk 10 feet activity   Assist  Walk 10 feet activity did not occur: Safety/medical concerns  Assist level: Contact Guard/Touching assist Assistive device: Walker-rolling   Walk 50 feet activity   Assist Walk 50 feet with 2 turns activity did not occur: Safety/medical concerns  Assist level: Contact Guard/Touching assist Assistive device: Walker-rolling    Walk 150 feet activity   Assist Walk 150 feet activity did not occur: Safety/medical concerns  Assist level: Contact Guard/Touching assist Assistive  device: Walker-rolling    Walk 10 feet on uneven surface  activity   Assist Walk 10 feet on uneven surfaces activity did not occur: Safety/medical concerns   Assist level: Contact Guard/Touching assist Assistive device: Walker-rolling   Wheelchair     Assist Is the patient using a wheelchair?: Yes Type of Wheelchair: Manual    Wheelchair assist level: Supervision/Verbal cueing Max wheelchair distance: >150 ft    Wheelchair 50 feet with 2 turns activity    Assist        Assist Level: Supervision/Verbal cueing   Wheelchair 150 feet activity     Assist      Assist Level: Supervision/Verbal cueing   Blood pressure (!) 100/42, pulse 77, temperature 98.3 F (36.8 C), temperature source Oral, resp. rate 14, height '5\' 5"'$  (1.651 m), weight 70.3 kg, SpO2 99 %.  Medical Problem List and Plan: 1.  R hemiplegia secondary to Metastatic disease from melanoma s/p L frontal crani 05/01/21  -Continue CIR therapies including PT, OT, and SLP . ELOS 8/23  -toe cap ordered to help with RLE clearance 2.  Age indeterminate DVT in right popliteal vein, and in bilateral posterior tibial and peroneal veins. Chronic DVT in right common femoral and femoral veins. Given age indeterminate DVTs and history of ICH     -f/u dopplers actually demonstrate resolution of dvt's except for "chronic" right popliteal thrombus. Connected with oncology who agrees with holding a/c at this time. No IVCF either             -antiplatelet therapy: N/A 3. Pain Management: Tylenol prn.  4. Mood: LCSW to follow for evaluation and support.              -antipsychotic agents: N/A 5. Neuropsych: This patient is capable of making decisions on her own behalf. 6. Skin/Wound Care: Staples removed      -foam dressing to foot--blisters improving  -elevating LE when resting with some improvement 7. Fluids/Electrolytes/Nutrition: encourage PO  -intake excellent   -protein supp for albumin  8/22 I personally  reviewed the patient's labs today.   8. Paroxymal A fib:/HTN: Monitor BP tid--continue metoprolol daily.             --Off Eliquis due to hemorrhage  HR Controlled on 8/22 9. Glaucoma: Has been stable on timoptic--will resume.  10. Pre-diabetes: Hgb A1C-6.1. Diet liberalized to regular per patient/family request.             - monitor FBS weekly 11.  Leucocytosis:              --no fevers or other signs of infection. Likely steroid related  WBCs 15.8 on 8/8--14.5 8/15  Tapering decadron to off 12. Hyponatremia: Sodium up to 135 on 8/8  Cont to monitor  13. Urinary retention:   8/22-began retaining today with urecholine taper---increase back to '50mg'$ . Pt will have to tolerate frequency in the short term  Continue Flomax  8/18 UCX  with 100k E coli   -continue bactrim x 5 days total 14. Constipation:   - scheduled miralax bid along with senna-s  Improved  LOS: 25 days A FACE TO FACE EVALUATION WAS PERFORMED  [name] 05/28/2021, 8:12 AM

## 2021-05-28 NOTE — Progress Notes (Signed)
Physical Therapy Session Note  Patient Details  Name: LOYD LAMOTTE MRN: [id number] Date of Birth: Nov 24, 1940  Today's Date: 05/28/2021 PT Individual Time: 1030-1100 PT Individual Time Calculation (min): 30 min   Short Term Goals: Week 3:  PT Short Term Goal 1 (Week 3): STG = LTG due to ELOS  Skilled Therapeutic Interventions/Progress Updates:     Pt in recliner to start - denies pain. Sit<>stand to RW with minA for boosting to rise - ambulates with RW and CGA on level surfaces >356f in rehab hallways. Cues for keeping body within walker frame and increasing R step height due to foot drag. Seated rest break prior to stair training. Navigated up/down x12 steps with minA and 2 hand rails - requires assist for guiding RUE along hand rail and assist for clearing R toe box over 6inch step. Instruction for step-to pattern with L foot leading ascent and R foot leading descent. No knee buckling or LOB noted - pt pleased with progress. Transported back to her room in w/c - ambulatory transfer with RW and CGA to recliner. Setup for comfort in recliner with pillows under BLE's and all needs in reach.  Therapy Documentation Precautions:  Precautions Precautions: Fall Precaution Comments: R hemiparesis Required Braces or Orthoses: Sling Restrictions Weight Bearing Restrictions: No RLE Weight Bearing: Weight bearing as tolerated General:    Therapy/Group: Individual Therapy  CAlger Simons8/22/2022, 7:33 AM

## 2021-05-28 NOTE — Progress Notes (Signed)
Occupational Therapy Session Note  Patient Details  Name: Angela Carpenter MRN: [id number] Date of Birth: [birth year]  Today's Date: 05/28/2021 OT Individual Time: 1400-1445 OT Individual Time Calculation (min): 45 min    Short Term Goals: Week 2:  OT Short Term Goal 1 (Week 2): Pt will complete UB dressing with MIN A OT Short Term Goal 1 - Progress (Week 2): Met OT Short Term Goal 2 (Week 2): Pt will thread BLE into pants with A for LE positioning only PRN OT Short Term Goal 2 - Progress (Week 2): Progressing toward goal OT Short Term Goal 3 (Week 2): Pt will transfer consistently to toilet with MOD A and LRAD OT Short Term Goal 3 - Progress (Week 2): Met OT Short Term Goal 4 (Week 2): Pt will recall adapative bathing strategies with MIN cuing and supervision OT Short Term Goal 4 - Progress (Week 2): Met Week 3:  OT Short Term Goal 1 (Week 3): STG= LTG d/t ELOS OT Short Term Goal 1 - Progress (Week 3): Progressing toward goal  Skilled Therapeutic Interventions/Progress Updates:    Patient seated in recliner, alert and ready for therapy although she states that she is tired.  She denies pain and requests to work on her right arm.  SPT to/from recliner, w/c, mat table with min/mod A and facilitation of right LE.  Completed AAROM, posture and seated balance activity with good tolerance.  Moved to supine position with min a.  Patient was surprised at how much active mobility she was able to complete in right arm.  Completed AAROM, stretch, facilitated OH reach with bimanual task, full elbow extension against gravity, completed supine OH coordination activity.  Returned to sitting and completed reach and grasp release activity, table top shoulder AROM - reviewed activity that she can complete on her own to promote increased functional use of right side as a whole.  Returned to recliner at close of session.   Seat alarm set and call bell in hand.     Therapy Documentation Precautions:   Precautions Precautions: Fall Precaution Comments: R hemiparesis Required Braces or Orthoses: Sling Restrictions Weight Bearing Restrictions: No RLE Weight Bearing: Weight bearing as tolerated  Therapy/Group: Individual Therapy  Carlos Levering 05/28/2021, 7:40 AM

## 2021-05-29 ENCOUNTER — Other Ambulatory Visit: Payer: Self-pay

## 2021-05-29 ENCOUNTER — Inpatient Hospital Stay: Payer: Medicare Other | Admitting: Oncology

## 2021-05-29 MED ORDER — ONDANSETRON HCL 4 MG PO TABS: 4.0000 mg | ORAL_TABLET | ORAL | 0 refills | Status: DC | PRN

## 2021-05-29 MED ORDER — LEVETIRACETAM 750 MG PO TABS
750.0000 mg | ORAL_TABLET | Freq: Two times a day (BID) | ORAL | 0 refills | Status: DC
Start: 2021-05-29 — End: 2021-06-13

## 2021-05-29 MED ORDER — POLYETHYLENE GLYCOL 3350 17 G PO PACK: 17.0000 g | PACK | Freq: Two times a day (BID) | ORAL | 0 refills | Status: AC

## 2021-05-29 MED ORDER — SENNOSIDES-DOCUSATE SODIUM 8.6-50 MG PO TABS: 1.0000 | ORAL_TABLET | Freq: Two times a day (BID) | ORAL | 0 refills | Status: AC

## 2021-05-29 MED ORDER — VITAMIN D-3 125 MCG (5000 UT) PO TABS: 5000.0000 [IU] | ORAL_TABLET | Freq: Every day | ORAL | 3 refills | Status: DC

## 2021-05-29 MED ORDER — PANTOPRAZOLE SODIUM 40 MG PO TBEC: 40.0000 mg | DELAYED_RELEASE_TABLET | Freq: Every day | ORAL | 0 refills | Status: DC

## 2021-05-29 MED ORDER — CAMPHOR-MENTHOL 0.5-0.5 % EX LOTN: TOPICAL_LOTION | CUTANEOUS | 0 refills | Status: AC | PRN

## 2021-05-29 MED ORDER — JUVEN PO PACK: 1.0000 | PACK | Freq: Two times a day (BID) | ORAL | 0 refills | Status: DC

## 2021-05-29 MED ORDER — TAMSULOSIN HCL 0.4 MG PO CAPS: 0.4000 mg | ORAL_CAPSULE | Freq: Every day | ORAL | 0 refills | Status: DC

## 2021-05-29 MED ORDER — BETHANECHOL CHLORIDE 50 MG PO TABS: 50.0000 mg | ORAL_TABLET | Freq: Three times a day (TID) | ORAL | 0 refills | Status: DC

## 2021-05-29 NOTE — Discharge Summary (Addendum)
Physician Discharge Summary  Patient ID: Angela Carpenter MRN: [id number] DOB/AGE: 1941-05-05 80 y.o.  Admit date: 05/03/2021 Discharge date: 05/29/2021  Discharge Diagnoses:  Principal Problem:   Metastatic cancer to brain [facility]) Active Problems:   Seizure disorder, focal motor ([place])   Right hemiplegia ([place])   Urinary retention   Slow transit constipation   Leucocytosis   Benign essential HTN   Discharged Condition: stable  Significant Diagnostic Studies: VAS [place] LOWER EXTREMITY VENOUS (DVT)  Result Date: 05/23/2021  Lower Venous DVT Study Patient Name:  Angela Carpenter  Date of Exam:   05/23/2021 Medical Rec #: [id number]      Accession #:    [id number] Date of Birth: 07/12/1941     Patient Gender: F Patient Age:   19 years Exam Location:  [facility] Procedure:      VAS [place] LOWER EXTREMITY VENOUS (DVT) Referring Phys: Alger Simons --------------------------------------------------------------------------------  Indications: F/U DVT.  Risk Factors: DVT. Comparison Study: 05/05/2021 - RIGHT:                   - Findings consistent with age indeterminate deep vein                   thrombosis involving the right popliteal vein, right posterior                   tibial veins, and right peroneal veins.                   - Findings consistent with chronic deep vein thrombosis                   involving the right common femoral vein, and right femoral                   vein.                    LEFT:                   - Findings consistent with age indeterminate deep vein                   thrombosis                   involving the left posterior tibial veins, and left peroneal                   veins. Performing Technologist: Oliver Hum RVT  Examination Guidelines: A complete evaluation includes B-mode imaging, spectral Doppler, color Doppler, and power Doppler as needed of all accessible portions of each vessel. Bilateral testing is considered an integral part of a complete examination.  Limited examinations for reoccurring indications may be performed as noted. The reflux portion of the exam is performed with the patient in reverse Trendelenburg.  +---------+---------------+---------+-----------+----------+--------------+ RIGHT    CompressibilityPhasicitySpontaneityPropertiesThrombus Aging +---------+---------------+---------+-----------+----------+--------------+ CFV      Full           Yes      Yes                                 +---------+---------------+---------+-----------+----------+--------------+ SFJ      Full                                                        +---------+---------------+---------+-----------+----------+--------------+  FV Prox  Full                                                        +---------+---------------+---------+-----------+----------+--------------+ FV Mid   Full                                                        +---------+---------------+---------+-----------+----------+--------------+ FV DistalFull                                                        +---------+---------------+---------+-----------+----------+--------------+ PFV      Full                                                        +---------+---------------+---------+-----------+----------+--------------+ POP      Partial        Yes      Yes                  Chronic        +---------+---------------+---------+-----------+----------+--------------+ PTV      Full                                                        +---------+---------------+---------+-----------+----------+--------------+ PERO     Full                                                        +---------+---------------+---------+-----------+----------+--------------+   +---------+---------------+---------+-----------+----------+--------------+ LEFT     CompressibilityPhasicitySpontaneityPropertiesThrombus Aging  +---------+---------------+---------+-----------+----------+--------------+ CFV      Full           Yes      Yes                                 +---------+---------------+---------+-----------+----------+--------------+ SFJ      Full                                                        +---------+---------------+---------+-----------+----------+--------------+ FV Prox  Full                                                        +---------+---------------+---------+-----------+----------+--------------+  FV Mid   Full                                                        +---------+---------------+---------+-----------+----------+--------------+ FV DistalFull                                                        +---------+---------------+---------+-----------+----------+--------------+ PFV      Full                                                        +---------+---------------+---------+-----------+----------+--------------+ POP      Full           Yes      Yes                                 +---------+---------------+---------+-----------+----------+--------------+ PTV      Full                                                        +---------+---------------+---------+-----------+----------+--------------+ PERO     Full                                                        +---------+---------------+---------+-----------+----------+--------------+     Summary: RIGHT: - Findings consistent with chronic deep vein thrombosis involving the right popliteal vein. - No cystic structure found in the popliteal fossa.  LEFT: - There is no evidence of deep vein thrombosis in the lower extremity.  - No cystic structure found in the popliteal fossa.  *See table(s) above for measurements and observations. Electronically signed by Harold Barban MD on 05/23/2021 at 10:39:26 PM.    Final    VAS [place] LOWER EXTREMITY VENOUS (DVT)  Result Date: 05/05/2021  Lower  Venous DVT Study Patient Name:  Angela Carpenter  Date of Exam:   05/05/2021 Medical Rec #: [id number]      Accession #:    [id number] Date of Birth: 03/25/1941     Patient Gender: F Patient Age:   54Y Exam Location:  [facility] Procedure:      VAS [place] LOWER EXTREMITY VENOUS (DVT) Referring Phys: [place] --------------------------------------------------------------------------------  Indications: Edema, and Metastatic brain cancer, rehabilitation.  Anticoagulation: Patient was on Eliquis for atrial fibrillation but it was discontinued in March 2022 secondary to brain mass. Comparison Study: No prior study Performing Technologist: Sharion Dove RVS  Examination Guidelines: A complete evaluation includes B-mode imaging, spectral Doppler, color Doppler, and power Doppler as needed of all accessible portions of each vessel. Bilateral testing is considered an integral part of a  complete examination. Limited examinations for reoccurring indications may be performed as noted. The reflux portion of the exam is performed with the patient in reverse Trendelenburg.  +---------+---------------+---------+-----------+----------+-----------------+ RIGHT    CompressibilityPhasicitySpontaneityPropertiesThrombus Aging    +---------+---------------+---------+-----------+----------+-----------------+ CFV      Partial        Yes      Yes                  Chronic           +---------+---------------+---------+-----------+----------+-----------------+ SFJ      Full                                                           +---------+---------------+---------+-----------+----------+-----------------+ FV Prox  Partial                                      Chronic           +---------+---------------+---------+-----------+----------+-----------------+ FV Mid   Partial                                      Chronic            +---------+---------------+---------+-----------+----------+-----------------+ FV DistalFull                                                           +---------+---------------+---------+-----------+----------+-----------------+ PFV      Full                                                           +---------+---------------+---------+-----------+----------+-----------------+ POP      None                                         Age Indeterminate +---------+---------------+---------+-----------+----------+-----------------+ PTV      None                                         Age Indeterminate +---------+---------------+---------+-----------+----------+-----------------+ PERO     None                                         Age Indeterminate +---------+---------------+---------+-----------+----------+-----------------+   +---------+---------------+---------+-----------+----------+-----------------+ LEFT     CompressibilityPhasicitySpontaneityPropertiesThrombus Aging    +---------+---------------+---------+-----------+----------+-----------------+ CFV      Full           Yes      Yes                                    +---------+---------------+---------+-----------+----------+-----------------+  SFJ      Full                                                           +---------+---------------+---------+-----------+----------+-----------------+ FV Prox  Full                                                           +---------+---------------+---------+-----------+----------+-----------------+ FV Mid   Full                                                           +---------+---------------+---------+-----------+----------+-----------------+ FV DistalFull                                                           +---------+---------------+---------+-----------+----------+-----------------+ PFV      Full                                                            +---------+---------------+---------+-----------+----------+-----------------+ POP      Full           Yes      Yes                                    +---------+---------------+---------+-----------+----------+-----------------+ PTV      Partial                                      Age Indeterminate +---------+---------------+---------+-----------+----------+-----------------+ PERO     Partial                                      Age Indeterminate +---------+---------------+---------+-----------+----------+-----------------+     Summary: RIGHT: - Findings consistent with age indeterminate deep vein thrombosis involving the right popliteal vein, right posterior tibial veins, and right peroneal veins. - Findings consistent with chronic deep vein thrombosis involving the right common femoral vein, and right femoral vein.  LEFT: - Findings consistent with age indeterminate deep vein thrombosis involving the left posterior tibial veins, and left peroneal veins.  *See table(s) above for measurements and observations. Electronically signed by Deitra Mayo MD on 05/05/2021 at 6:44:38 PM.    Final     Labs:  Basic Metabolic Panel: BMP Latest Ref Rng & Units 05/28/2021 05/21/2021 05/14/2021  Glucose 70 - 99 mg/dL 90 87 93  BUN 8 - 23 mg/dL 18 12 21  Creatinine 0.44 - 1.00 mg/dL 0.56 0.51 0.53  BUN/Creat Ratio 12 - 28 - - -  Sodium 135 - 145 mmol/L 137 139 135  Potassium 3.5 - 5.1 mmol/L 3.7 3.6 3.8  Chloride 98 - 111 mmol/L 105 104 101  CO2 22 - 32 mmol/L '25 27 27  '$ Calcium 8.9 - 10.3 mg/dL 8.7(L) 8.6(L) 8.8(L)     CBC: CBC Latest Ref Rng & Units 05/28/2021 05/21/2021 05/14/2021  WBC 4.0 - 10.5 K/uL 14.8(H) 14.5(H) 15.8(H)  Hemoglobin 12.0 - 15.0 g/dL 11.8(L) 11.8(L) 12.9  Hematocrit 36.0 - 46.0 % 36.5 36.3 38.3  Platelets 150 - 400 K/uL 318 277 248     CBG: Recent Labs  Lab 06/01/21 0721  GLUCAP 107*    Brief HPI:   IZAMARY FAILS is a 80 y.o.  RH-female with history of metastatic melanoma to the brain with hemorrhagic metastases to left frontal lobe 12/2020 with residual right-sided weakness s/p SRS, breast cancer, A. fib who was admitted on 04/23/2021 with 4-week history of right-sided weakness progressing to flaccidity with difficulty walking as well as slurred speech.  MRI brain done showing significant interval increase in size of hemorrhagic left frontal lobe metastases with increase in surrounding vasogenic edema and local mass-effect.  She was started on IV Decadron, case was discussed with multidisciplinary cancer team with recommendations for surgical resection for better disease control.  She had generalized seizure activity on 07/24 and Keppra was increased to 750 mg twice daily.  EEG done showing intermittent slowing in left temporal region likely due to underlying hemorrhage and no seizure activity.  She underwent stereotactic left frontal craniotomy for resection of tumor by Dr. Kathyrn Sheriff on 07/26 with recommendations to continue Decadron.  Therapy evaluations done revealing ongoing deficits due to right hemiplegia, difficulty following multistep command as well as difficulty with ADLs.  CIR was recommended due to functional decline.   Hospital Course: MADYSIN CROSSIN was admitted to rehab 05/03/2021 for inpatient therapies to consist of PT, ST and OT at least three hours five days a week. Past admission physiatrist, therapy team and rehab RN have worked together to provide customized collaborative inpatient rehab.  Her blood pressures and heart rate were monitored on TID basis and have been stable on metoprolol.  Surveillance Dopplers done past admission showing evidence of age indeterminant right popliteal, posterior tib and peroneal DVTs.  Heme-onc was consulted for input and recommended monitoring due to history of bleed and no need for IVC. She was started on lovenox prophylactic dose and repeat dopplers 08/17 showed chronic DVT in right  popliteal vein and no evidence of DVT in left lower extremity.  Protein supplement was added to help with low protein stores. Diet was liberalized to regular to help with po intake. Fasting blood sugars have shown good control. Constipation has resolved with augmentation of bowel regimen. She was noted to have difficulty voiding with urinary retention requiring in and out catheterizations of up to 900 cc.  She was started on Urecholine as well as Flomax and tolterodine scheduled to help with voiding. Urine culture was done showing E coli and she was treated with 5 day course of bactrim. Urecholine was titrated up to 50 mg tid with good results and attempts at taper resulted in difficulty voiding therefore she was discharged on this dose with recommendations for slow taper after discharge.   She has been seizure free on current dose of Keppra. Follow-up CBC shows acute blood loss anemia to be relatively stable.  Leukocytosis has been monitored and showing slight improvement without signs of infection.  Decadron was slowly tapered off by 08/22. Serial check of lytes showed that hyponatremia has resolved. Her cranial incision is healed well without any signs or symptoms of infection and staples were removed without difficulty. Blisters on foot were treated with foam dressing for padding and TEDs for edema control. Sling was used for support of RUE prn. She has made steady gains and requires supervision to min assistance.  She will continue to receive follow up [place], [place] and [place] by [place] after discharge.   Rehab course: During patient's stay in rehab weekly team conferences were held to monitor patient's progress, set goals and discuss barriers to discharge. At admission, patient required max assist with basic self care tasks and with mobility. She exhibited anomia and difficulty with complex tasks  She  has had improvement in activity tolerance, balance, postural control as well as ability to  compensate for deficits. She requires supervision to min assist with ADL tasks.  She requires min assist for transfers and CGA for ambulating 300' with RW.  She was modified independent for word finding, complex and abstract language. She was able to complete functional and complex tasks with supervision therefore ST signed off on 08/19.  Family education has been completed.    Disposition: Home   Diet: Regular.   Special Instructions: Wear support stockings during the day. No driving till seizure free for 6 months.     Discharge Instructions     Ambulatory referral to Physical Medicine Rehab   Complete by: As directed    Hospital follow up      Allergies as of 05/29/2021   No Known Allergies      Medication List     STOP taking these medications    dexamethasone 1 MG tablet Commonly known as: DECADRON       TAKE these medications    acyclovir 400 MG tablet Commonly known as: ZOVIRAX Take 400 mg by mouth 2 (two) times daily.   atorvastatin 10 MG tablet Commonly known as: LIPITOR Take 1 tablet (10 mg total) by mouth daily.   bethanechol 50 MG tablet Commonly known as: URECHOLINE Take 1 tablet (50 mg total) by mouth 3 (three) times daily. Notes to patient: If bladder function remains good--in 7 days taper to twice a day. In a week, if voiding function remains good,  decrease it to once a day. Then after a week stop.     camphor-menthol lotion Commonly known as: SARNA Apply topically as needed for itching.   levETIRAcetam 750 MG tablet Commonly known as: KEPPRA Take 1 tablet (750 mg total) by mouth 2 (two) times daily.   metoprolol succinate 25 MG 24 hr tablet Commonly known as: TOPROL-XL Take 1 tablet (25 mg total) by mouth 2 (two) times daily.   nutrition supplement (JUVEN) Pack Take 1 packet by mouth 2 (two) times daily between meals.   ondansetron 4 MG tablet Commonly known as: ZOFRAN Take 1 tablet (4 mg total) by mouth every 4 (four) hours as needed  for nausea or vomiting.   pantoprazole 40 MG tablet Commonly known as: PROTONIX Take 1 tablet (40 mg total) by mouth at bedtime.   polyethylene glycol 17 g packet Commonly known as: MIRALAX / GLYCOLAX Take 17 g by mouth 2 (two) times daily.   PreserVision AREDS 2+Multi Vit Caps Take 1 capsule by mouth 2 (two) times daily.   raloxifene 60 MG tablet Commonly  known as: EVISTA Take 60 mg by mouth in the morning.   senna-docusate 8.6-50 MG tablet Commonly known as: Senokot-S Take 1 tablet by mouth 2 (two) times daily.   tamsulosin 0.4 MG Caps capsule Commonly known as: FLOMAX Take 1 capsule (0.4 mg total) by mouth daily after supper.   timolol 0.5 % ophthalmic solution Commonly known as: TIMOPTIC Place 1 drop into both eyes 2 (two) times daily.        Follow-up Information     [name], MD Follow up.   Specialty: Physical Medicine and Rehabilitation Why: office will call you Contact information: [address] [place] [address] [place] [address] [phone]         Tisovec, [name], MD. Call.   Specialty: Internal Medicine Why: for follow up appointment Contact information: [place] [address] [phone]         [name], MD. Call.   Specialty: Oncology Why: for follow up appointment Contact information: [place] [address] [phone]         Consuella Lose, MD. Call.   Specialty: Neurosurgery Why: for post op appointment Contact information: [address] [place] [address] [place] [address] [phone]                 Signed: Bary Leriche 06/03/2021, 10:42 PM

## 2021-05-29 NOTE — Progress Notes (Signed)
PROGRESS NOTE   Subjective/Complaints: Voiding improved yesterday. Voided only once over night. Slept better. Pain controlled  ROS: Patient denies fever, rash, sore throat, blurred vision, nausea, vomiting, diarrhea, cough, shortness of breath or chest pain, joint or back pain, headache, or mood change.    Objective:   No results found. Recent Labs    05/28/21 0636  WBC 14.8*  HGB 11.8*  HCT 36.5  PLT 318      Recent Labs    05/28/21 0636  NA 137  K 3.7  CL 105  CO2 25  GLUCOSE 90  BUN 18  CREATININE 0.56  CALCIUM 8.7*       Intake/Output Summary (Last 24 hours) at 05/29/2021 [id number] Last data filed at 05/29/2021 0700 Gross per 24 hour  Intake 480 ml  Output --  Net 480 ml        Physical Exam: Vital Signs Blood pressure (!) 100/46, pulse 75, temperature 98.1 F (36.7 C), temperature source Oral, resp. rate 18, height '5\' 5"'$  (1.651 m), weight 70.3 kg, SpO2 100 %.   Constitutional: No distress . Vital signs reviewed. HEENT: NCAT, EOMI, oral membranes moist Neck: supple Cardiovascular: RRR without murmur. No JVD    Respiratory/Chest: CTA Bilaterally without wheezes or rales. Normal effort    GI/Abdomen: BS +, non-tender, non-distended Ext: no clubbing, cyanosis, or edema Psych: pleasant and cooperative  Skin: Warm and dry. Blisters on feet broken/resolving. Scalp wound healing with scabs decreasing Musc: No tenderness in bilateral extremities. Neuro: Alert and oriented Motor: RUE: Proximally 1/5, wrist 2+/5,hand grip 3+/5--  RLE: Proximally 1/5, 2/5 KE, ankle dorsiflexion 2-/5--stable exam  Assessment/Plan: 1. Functional deficits which require 3+ hours per day of interdisciplinary therapy in a comprehensive inpatient rehab setting. Physiatrist is providing close team supervision and 24 hour management of active medical problems listed below. Physiatrist and rehab team continue to assess barriers to  discharge/monitor patient progress toward functional and medical goals  Care Tool:  Bathing    Body parts bathed by patient: Right arm, Chest, Abdomen, Right upper leg, Left upper leg, Face, Front perineal area, Buttocks, Right lower leg, Left lower leg, Left arm   Body parts bathed by helper: Left arm, Buttocks, Front perineal area, Right lower leg, Left lower leg Body parts n/a: Front perineal area   Bathing assist Assist Level: Supervision/Verbal cueing (per staff)     Upper Body Dressing/Undressing Upper body dressing   What is the patient wearing?: Pull over shirt Orthosis activity level: Performed by helper  Upper body assist Assist Level: Supervision/Verbal cueing (per staff)    Lower Body Dressing/Undressing Lower body dressing      What is the patient wearing?: Underwear/pull up, Pants     Lower body assist Assist for lower body dressing: Supervision/Verbal cueing (per staff)     Toileting Toileting    Toileting assist Assist for toileting: Contact Guard/Touching assist     Transfers Chair/bed transfer  Transfers assist     Chair/bed transfer assist level: Minimal Assistance - Patient > 75% Chair/bed transfer assistive device: Programmer, multimedia   Ambulation assist   Ambulation activity did not occur: Safety/medical concerns  Assist level: Contact  Guard/Touching assist Assistive device: Walker-rolling Max distance: 340f   Walk 10 feet activity   Assist  Walk 10 feet activity did not occur: Safety/medical concerns  Assist level: Contact Guard/Touching assist Assistive device: Walker-rolling   Walk 50 feet activity   Assist Walk 50 feet with 2 turns activity did not occur: Safety/medical concerns  Assist level: Contact Guard/Touching assist Assistive device: Walker-rolling    Walk 150 feet activity   Assist Walk 150 feet activity did not occur: Safety/medical concerns  Assist level: Contact Guard/Touching  assist Assistive device: Walker-rolling    Walk 10 feet on uneven surface  activity   Assist Walk 10 feet on uneven surfaces activity did not occur: Safety/medical concerns   Assist level: Contact Guard/Touching assist Assistive device: Walker-rolling   Wheelchair     Assist Is the patient using a wheelchair?: Yes Type of Wheelchair: Manual    Wheelchair assist level: Supervision/Verbal cueing Max wheelchair distance: >150 ft    Wheelchair 50 feet with 2 turns activity    Assist        Assist Level: Supervision/Verbal cueing   Wheelchair 150 feet activity     Assist      Assist Level: Supervision/Verbal cueing   Blood pressure (!) 100/46, pulse 75, temperature 98.1 F (36.7 C), temperature source Oral, resp. rate 18, height '5\' 5"'$  (1.651 m), weight 70.3 kg, SpO2 100 %.  Medical Problem List and Plan: 1.  R hemiplegia secondary to Metastatic disease from melanoma s/p L frontal crani 05/01/21  Dc home today 8/23  -f/u with [facility], NS, primary  -HAlexis2.  Age indeterminate DVT in right popliteal vein, and in bilateral posterior tibial and peroneal veins. Chronic DVT in right common femoral and femoral veins. Given age indeterminate DVTs and history of ICH     -f/u dopplers actually demonstrate resolution of dvt's except for "chronic" right popliteal thrombus. Connected with oncology who agrees with holding a/c at this time. No IVCF either             -antiplatelet therapy: N/A 3. Pain Management: Tylenol prn.  4. Mood: LCSW to follow for evaluation and support.              -antipsychotic agents: N/A 5. Neuropsych: This patient is capable of making decisions on her own behalf. 6. Skin/Wound Care:      -foam dressing to foot--blisters improving  -elevate LE when possible 7. Fluids/Electrolytes/Nutrition: encourage PO  -intake excellent   -protein supp for albumin      8. Paroxymal A fib:/HTN: Monitor BP tid--continue metoprolol daily.             --Off  Eliquis due to hemorrhage  HR Controlled on 8/23 9. Glaucoma: Has been stable on timoptic--will resume.  10. Pre-diabetes: Hgb A1C-6.1. Diet liberalized to regular per patient/family request.             - monitor FBS weekly 11.  Leucocytosis:              --no fevers or other signs of infection. Likely steroid related  WBCs 15.8 on 8/8--14.5 8/15  Tapering decadron to off 12. Hyponatremia: Sodium up to 135 on 8/8  Cont to monitor  13. Urinary retention:     UCX with 100k E coli   -  bactrim x 5 days completed  -continue flomax and urecholine  -if no problems by this weekend she can decrease urecholine to bid. Continue bid x 5 days and if no issues decrease to  qd. Continue qd x 5 days then stop if still voiding  14. Constipation:   - scheduled miralax bid along with senna-s  Improved  LOS: 26 days A FACE TO FACE EVALUATION WAS PERFORMED  [name] 05/29/2021, 9:07 AM

## 2021-05-29 NOTE — Progress Notes (Signed)
Inpatient Rehabilitation Care Coordinator Discharge Note   Patient Details  Name: Angela Carpenter MRN: [id number] Date of Birth: Mar 16, 1941   Discharge location: D/c to home with 24/7 care from pt son, and assistance with her brother and SIL  Length of Stay: 25 days  Discharge activity level: Supervision to Minimal Assistance  Home/community participation: Limited  Patient response [id number] Literacy - How often do you need to have someone help you when you read instructions, pamphlets, or other written material from your doctor or pharmacy?: Never  Patient response [id number] Isolation - How often do you feel lonely or isolated from those around you?: Never  Services provided included: MD, RD, PT, OT, RN, CM, TR, Pharmacy, Neuropsych, SW  Financial Services:  Charity fundraiser Utilized: Medicare    Choices offered to/list presented to: Yes  Follow-up services arranged:  Home Health, DME Home Health Agency: [facility] for HHPT/OT/aide/SN    DME : [facility] for w/c and hospital bed    Patient response to transportation need: Is the patient able to respond to transportation needs?: Yes In the past 12 months, has lack of transportation kept you from medical appointments or from getting medications?: No In the past 12 months, has lack of transportation kept you from meetings, work, or from getting things needed for daily living?: No  Comments (or additional information):N/A  Patient/Family verbalized understanding of follow-up arrangements:  Yes  Individual responsible for coordination of the follow-up plan: pt son [name] [phone]  Confirmed correct DME delivered: Rana Snare 05/29/2021    Rana Snare

## 2021-05-29 NOTE — Telephone Encounter (Signed)
Per Son Vit D script not sent. Please send to [facility]. Thank you.

## 2021-05-31 ENCOUNTER — Ambulatory Visit
Admission: RE | Admit: 2021-05-31 | Discharge: 2021-05-31 | Disposition: A | Discharge status: Home / Self Care | Payer: Medicare Other | Source: Ambulatory Visit | Attending: Radiation Oncology | Admitting: Radiation Oncology

## 2021-05-31 ENCOUNTER — Encounter: Payer: Self-pay | Admitting: Radiation Oncology

## 2021-05-31 ENCOUNTER — Encounter: Payer: Self-pay | Admitting: Oncology

## 2021-05-31 ENCOUNTER — Other Ambulatory Visit: Payer: Self-pay

## 2021-05-31 ENCOUNTER — Telehealth: Payer: Self-pay | Admitting: *Deleted

## 2021-05-31 VITALS — BP 97/55 | HR 98 | Temp 98.6°F | Resp 18 | Ht 65.0 in | Wt 150.0 lb

## 2021-05-31 DIAGNOSIS — Z800 Family history of malignant neoplasm of digestive organs: Secondary | ICD-10-CM | POA: Diagnosis not present

## 2021-05-31 DIAGNOSIS — Z801 Family history of malignant neoplasm of trachea, bronchus and lung: Secondary | ICD-10-CM | POA: Insufficient documentation

## 2021-05-31 DIAGNOSIS — Z86010 Personal history of colonic polyps: Secondary | ICD-10-CM | POA: Diagnosis not present

## 2021-05-31 DIAGNOSIS — E7800 Pure hypercholesterolemia, unspecified: Secondary | ICD-10-CM | POA: Insufficient documentation

## 2021-05-31 DIAGNOSIS — H409 Unspecified glaucoma: Secondary | ICD-10-CM | POA: Diagnosis not present

## 2021-05-31 DIAGNOSIS — I480 Paroxysmal atrial fibrillation: Secondary | ICD-10-CM | POA: Diagnosis not present

## 2021-05-31 DIAGNOSIS — C7931 Secondary malignant neoplasm of brain: Secondary | ICD-10-CM | POA: Insufficient documentation

## 2021-05-31 DIAGNOSIS — Z923 Personal history of irradiation: Secondary | ICD-10-CM | POA: Insufficient documentation

## 2021-05-31 DIAGNOSIS — M810 Age-related osteoporosis without current pathological fracture: Secondary | ICD-10-CM | POA: Insufficient documentation

## 2021-05-31 DIAGNOSIS — G40909 Epilepsy, unspecified, not intractable, without status epilepticus: Secondary | ICD-10-CM | POA: Diagnosis not present

## 2021-05-31 DIAGNOSIS — Z8673 Personal history of transient ischemic attack (TIA), and cerebral infarction without residual deficits: Secondary | ICD-10-CM | POA: Diagnosis not present

## 2021-05-31 DIAGNOSIS — Z510 Encounter for antineoplastic radiation therapy: Secondary | ICD-10-CM | POA: Diagnosis not present

## 2021-05-31 DIAGNOSIS — Z853 Personal history of malignant neoplasm of breast: Secondary | ICD-10-CM | POA: Diagnosis not present

## 2021-05-31 DIAGNOSIS — I4891 Unspecified atrial fibrillation: Secondary | ICD-10-CM | POA: Diagnosis not present

## 2021-05-31 DIAGNOSIS — C211 Malignant neoplasm of anal canal: Secondary | ICD-10-CM | POA: Insufficient documentation

## 2021-05-31 DIAGNOSIS — Z9221 Personal history of antineoplastic chemotherapy: Secondary | ICD-10-CM | POA: Diagnosis not present

## 2021-05-31 DIAGNOSIS — Z9181 History of falling: Secondary | ICD-10-CM | POA: Diagnosis not present

## 2021-05-31 DIAGNOSIS — Z79899 Other long term (current) drug therapy: Secondary | ICD-10-CM | POA: Diagnosis not present

## 2021-05-31 DIAGNOSIS — I82431 Acute embolism and thrombosis of right popliteal vein: Secondary | ICD-10-CM | POA: Diagnosis not present

## 2021-05-31 DIAGNOSIS — Z803 Family history of malignant neoplasm of breast: Secondary | ICD-10-CM | POA: Diagnosis not present

## 2021-05-31 DIAGNOSIS — R4701 Aphasia: Secondary | ICD-10-CM | POA: Diagnosis not present

## 2021-05-31 DIAGNOSIS — C434 Malignant melanoma of scalp and neck: Secondary | ICD-10-CM | POA: Diagnosis not present

## 2021-05-31 DIAGNOSIS — Z87440 Personal history of urinary (tract) infections: Secondary | ICD-10-CM | POA: Diagnosis not present

## 2021-05-31 DIAGNOSIS — Z87891 Personal history of nicotine dependence: Secondary | ICD-10-CM | POA: Diagnosis not present

## 2021-05-31 DIAGNOSIS — R7303 Prediabetes: Secondary | ICD-10-CM | POA: Diagnosis not present

## 2021-05-31 DIAGNOSIS — G8191 Hemiplegia, unspecified affecting right dominant side: Secondary | ICD-10-CM | POA: Diagnosis not present

## 2021-05-31 DIAGNOSIS — I10 Essential (primary) hypertension: Secondary | ICD-10-CM | POA: Diagnosis not present

## 2021-05-31 NOTE — Telephone Encounter (Signed)
Contacted by Sharyn Lull, nurse with [facility]. She requested following orders for Ms. Fatima Sanger: Skilled Nurse visits once a week for 9 weeks; Home Health aid once a week for 4 weeks; Evaluation (one time) for Speech Therapy.  Dr. Alen Blew informed of request for orders. Per Dr. Alen Blew - contacted Sharyn Lull [phone]) and gave verbal ok for orders as requested

## 2021-05-31 NOTE — Progress Notes (Signed)
Radiation Oncology         [phone] ________________________________  Name: Angela Carpenter        MRN: [id number]  Date of Service: 05/31/2021 DOB: 12/06/1940  [id number], [name], MD  Tisovec, [name], MD     REFERRING PHYSICIAN: Tisovec, [name], MD   DIAGNOSIS: The primary encounter diagnosis was Metastatic cancer to brain [facility]). Diagnoses of Metastasis to brain [facility]) and Malignant melanoma of anal canal ([place]) were also pertinent to this visit.   HISTORY OF PRESENT ILLNESS: Angela Carpenter is a 80 y.o. female with a history of metastatic melanoma  to the brain.  The patient was originally diagnosed with stage IV melanoma arising in the canal of the anus and had been receiving immunotherapy with Dr. Alen Blew, she had taken a break from immunotherapy and unfortunately in March 2022 developed a headache that progressed and was seen in the emergency department after she had lost control of her right arm and loss of consciousness.  This was on 12/11/2020 when she was evaluated in the emergency room a CT of the brain showed a hyperdense lesion in the left frontal lobe measuring approximately 2 cm in MRI showed hemorrhagic metastatic lesion consistent with a 19 mm lesion in the frontal lobe with surrounding vasogenic edema no acute stroke was identified and confirmed by MRI with angiography but showed a chronic left MCA occlusion she received stereotactic radiosurgery under the care of Dr. Lisbeth Renshaw with single dose treatment on 12/28/2020.  Because of her neurologic deficits and history of prior stroke, she was referred to Dr. Mickeal Skinner for surveillance.  She did have significant difficulties over the early part of the summer with symptoms of persisting edema.  She went to the emergency department on 04/23/2021 after having progressive right-sided weakness and her CT head showed increasing concerns for the lesion in the left frontal lobe.  MRI with and without contrast that day showed that the lesion measured  3.1 x 2.6 x 3 cm with increasing vasogenic edema in the frontal and parietal lobes with associated regional mass-effect.  Her case was discussed by multiple providers and ultimately she was taken to the operating room by Dr. Kathyrn Sheriff on 05/01/2021 excision of the mass confirmed melanoma as did hematoma associated in the specimen.  Postoperative imaging with MRI on 05/02/2021 showed pneumo Sevilis subjacent to the craniotomy bone flap and overlying the anterior left frontal convexity trace extra-axial collection measuring up to 2 mm noted overlying the left temporal occipital convexity but the previously noted left frontal metastasis was resected with scattered blood products in the resection cavity minimal smooth enhancement about the margin of the resection cavity was favored to be postoperative without residual visible tumor.  A small focus of restricted diffusion at the deep margin of the resection cavity consistent with a small periresection infarct was noted and surrounding vasogenic edema within the left cerebral hemisphere adjacent to the surgical site was mildly worsened.  No midline shift was noted.  She has had a prolonged recovery with the rehab department at [facility] health, she was discharged ultimately from their department on 05/29/2021.  She is still planning to work extensively with home health with PT OT and is seen today to discuss adjuvant radiotherapy to the surgical cavity.   PREVIOUS RADIATION THERAPY: No  12/28/2020 through 12/28/2020 Site Technique Total Dose (Gy) Dose per Fx (Gy) Completed Fx Beam Energies  Brain: Brain IMRT 18/18 18 1/1 [id number]   2006: The patient received adjuvant radiotherapy  to the right breast following lumpectomy in [place], [place]. Details are unavailable.  PAST MEDICAL HISTORY:  Past Medical History:  Diagnosis Date   A-fib [facility])    Atrial fibrillation ([place])    4 years ago started   Brain cancer ([place]) 12/2020   Breast cancer ([place]) 2006   Colon polyps     Glaucoma    Hypercholesteremia    Hypertension    patient denies    Osteoporosis    Personal history of chemotherapy 2006   Personal history of radiation therapy 2006   Stroke (cerebrum) ([place]) 2015       PAST SURGICAL HISTORY: Past Surgical History:  Procedure Laterality Date   APPLICATION OF CRANIAL NAVIGATION Left 05/01/2021   Procedure: APPLICATION OF CRANIAL NAVIGATION;  Surgeon: Consuella Lose, MD;  Location: [place];  Service: Neurosurgery;  Laterality: Left;   AUGMENTATION MAMMAPLASTY Bilateral 2006   Patient had them removed in 2008   BREAST BIOPSY Left 2016   BREAST LUMPECTOMY Right 2006   COLONOSCOPY     had polyps   CRANIOTOMY Left 05/01/2021   Procedure: STEREOTACTIC FRONTAL CRANIOTOMY TUMOR EXCISION;  Surgeon: Consuella Lose, MD;  Location: [place];  Service: Neurosurgery;  Laterality: Left;   EYE SURGERY Right    cataract   MELANOMA EXCISION WITH SENTINEL LYMPH NODE BIOPSY N/A 06/02/2019   Procedure: Left lateral ANAL MELANOMA EXCISION WITH Bilateral Groin exploration and SENTINEL node mapping;  Surgeon: [name], MD;  Location: [place];  Service: General;  Laterality: N/A;   melanoma removal Left    crown   TONSILLECTOMY       FAMILY HISTORY:  Family History  Problem Relation Age of Onset   Lung cancer Mother    Hypotension Mother    Pancreatic cancer Father    Hypertension Sister    Kidney cancer Brother    Cirrhosis Maternal Grandfather    Breast cancer Paternal Grandmother    Cirrhosis Paternal Grandfather    Breast cancer Paternal Aunt      SOCIAL HISTORY:  reports that she has never smoked. She has been exposed to tobacco smoke. She has never used smokeless tobacco. She reports that she does not drink alcohol and does not use drugs. The patient is widowed and lives in [place]. Her son accompanies her today.   ALLERGIES: Patient has no known allergies.   MEDICATIONS:  Current Outpatient Medications  Medication Sig Dispense Refill    acyclovir (ZOVIRAX) 400 MG tablet Take 400 mg by mouth 2 (two) times daily.     atorvastatin (LIPITOR) 10 MG tablet Take 1 tablet (10 mg total) by mouth daily. 90 tablet 0   bethanechol (URECHOLINE) 50 MG tablet Take 1 tablet (50 mg total) by mouth 3 (three) times daily. 90 tablet 0   camphor-menthol (SARNA) lotion Apply topically as needed for itching. 222 mL 0   Cholecalciferol (VITAMIN D-3) 125 MCG (5000 UT) TABS Take 5,000 Units by mouth daily. 30 tablet 3   levETIRAcetam (KEPPRA) 750 MG tablet Take 1 tablet (750 mg total) by mouth 2 (two) times daily. 60 tablet 0   metoprolol succinate (TOPROL-XL) 25 MG 24 hr tablet Take 1 tablet (25 mg total) by mouth 2 (two) times daily. 180 tablet 0   Multiple Vitamins-Minerals (PRESERVISION AREDS 2+MULTI VIT) CAPS Take 1 capsule by mouth 2 (two) times daily.     nutrition supplement, JUVEN, (JUVEN) PACK Take 1 packet by mouth 2 (two) times daily between meals. 60 packet 0   ondansetron (ZOFRAN)  4 MG tablet Take 1 tablet (4 mg total) by mouth every 4 (four) hours as needed for nausea or vomiting. 20 tablet 0   pantoprazole (PROTONIX) 40 MG tablet Take 1 tablet (40 mg total) by mouth at bedtime. 30 tablet 0   polyethylene glycol (MIRALAX / GLYCOLAX) 17 g packet Take 17 g by mouth 2 (two) times daily. 60 each 0   raloxifene (EVISTA) 60 MG tablet Take 60 mg by mouth in the morning.     senna-docusate (SENOKOT-S) 8.6-50 MG tablet Take 1 tablet by mouth 2 (two) times daily. 60 tablet 0   tamsulosin (FLOMAX) 0.4 MG CAPS capsule Take 1 capsule (0.4 mg total) by mouth daily after supper. 30 capsule 0   timolol (TIMOPTIC) 0.5 % ophthalmic solution Place 1 drop into both eyes 2 (two) times daily.     No current facility-administered medications for this encounter.     REVIEW OF SYSTEMS: On review of systems, the patient reports that she is doing about as well as can be expected.  She continues to have mild fatigue.  She reports no headaches skin changes or  nausea.  She is off of Decadron now and reports that she is not having any speech difficulties.  Her son states that he has noticed a few episodes of forgetting which day it may be.  She attributes this to the duration of her hospitalization, she reports fine motor movements are slightly impaired still and she has a difficult time navigating on her iPad and signing her name on paperwork so her son has been doing this for her.  She has had muffled hearing bilaterally but states this is a new change since her surgery.  She has had blurred vision and increasing sensitivity to bright lights and feels that looking at a screen seems to be much more intense for her than prior to surgery.  She can only take very small steps with with assistance of others and has been using a wheelchair or walker.  Her son continues to be her primary advocate. No other complaints are verbalized.    PHYSICAL EXAM:  Wt Readings from Last 3 Encounters:  05/31/21 150 lb (68 kg)  05/03/21 154 lb 15.7 oz (70.3 kg)  04/24/21 148 lb 5.9 oz (67.3 kg)   Temp Readings from Last 3 Encounters:  05/31/21 98.6 F (37 C) (Oral)  05/29/21 98.1 F (36.7 C) (Oral)  05/03/21 97.7 F (36.5 C) (Oral)   BP Readings from Last 3 Encounters:  05/31/21 (!) 97/55  05/29/21 (!) 100/46  05/03/21 134/70   Pulse Readings from Last 3 Encounters:  05/31/21 98  05/29/21 75  05/03/21 63   Pain Assessment Pain Score: 0-No pain/10  In general this is a somewhat frail appearing Caucasian female in no acute distress. She's alert and oriented x4 and appropriate throughout the examination. Cardiopulmonary assessment is negative for acute distress and she exhibits normal effort.     ECOG = 2  0 - Asymptomatic (Fully active, able to carry on all predisease activities without restriction)  1 - Symptomatic but completely ambulatory (Restricted in physically strenuous activity but ambulatory and able to carry out work of a light or sedentary nature.  For example, light housework, office work)  2 - Symptomatic, <50% in bed during the day (Ambulatory and capable of all self care but unable to carry out any work activities. Up and about more than 50% of waking hours)  3 - Symptomatic, >50% in bed, but not bedbound (  Capable of only limited self-care, confined to bed or chair 50% or more of waking hours)  4 - Bedbound (Completely disabled. Cannot carry on any self-care. Totally confined to bed or chair)  5 - Death   Eustace Pen MM, Creech RH, Tormey DC, et al. [phone]). "Toxicity and response criteria of the [facility]". [place] Oncol. 5 (6): 649-55    LABORATORY DATA:  Lab Results  Component Value Date   WBC 14.8 (H) 05/28/2021   HGB 11.8 (L) 05/28/2021   HCT 36.5 05/28/2021   MCV 98.6 05/28/2021   PLT 318 05/28/2021   Lab Results  Component Value Date   NA 137 05/28/2021   K 3.7 05/28/2021   CL 105 05/28/2021   CO2 25 05/28/2021   Lab Results  Component Value Date   ALT 55 (H) 05/04/2021   AST 21 05/04/2021   ALKPHOS 50 05/04/2021   BILITOT 0.4 05/04/2021      RADIOGRAPHY: MR BRAIN W WO CONTRAST  Result Date: 05/02/2021 CLINICAL DATA:  Follow-up examination status post stereotactic left frontal craniotomy for tumor resection. EXAM: MRI HEAD WITHOUT AND WITH CONTRAST TECHNIQUE: Multiplanar, multiecho pulse sequences of the brain and surrounding structures were obtained without and with intravenous contrast. CONTRAST:  6.27m GADAVIST GADOBUTROL 1 MMOL/ML IV SOLN COMPARISON:  Prior MRI from 03/23/2021. FINDINGS: Brain: Cerebral volume stable, and remains within normal limits. Chronic microvascular ischemic disease with a few scattered remote lacunar infarcts about the left basal ganglia noted. Postoperative changes from interval left frontal craniotomy for tumor resection are seen. Postoperative pneumocephalus seen subjacent to the craniotomy bone flap and overlying the anterior left frontal convexity. Trace  extra-axial collection measuring up to 2 mm noted overlying the left temporal occipital convexity. Previously seen left frontal metastasis has been resected. Scattered blood products noted within the resection cavity. Minimal smooth enhancement about the margin of the resection cavity favored to be postoperative. No visible residual tumor. Small focus of restricted diffusion at the deep margin of the resection cavity consistent with a small Peri resection infarct (series 5, image 91). Surrounding vasogenic edema within the adjacent left cerebral hemisphere has mildly worsened as compared to prior. No significant midline shift. No other evidence for acute or subacute infarct. No other mass lesion or abnormal enhancement. No hydrocephalus. Vascular: Attenuated flow voids within the left M1 segment and distal left MCA branches, consistent with known chronic left MCA occlusion. Hypoplastic right vertebral artery. Major intracranial vascular flow voids are otherwise maintained. Skull and upper cervical spine: Craniocervical junction within normal limits. Bone marrow signal intensity normal. Post craniotomy changes at the left frontal scalp without adverse features. Sinuses/Orbits: Prior ocular lens replacement on the right. Globes and orbital soft tissues demonstrate no acute finding. Paranasal sinuses and mastoid air cells are largely clear. Other: None. IMPRESSION: 1. Postoperative changes from interval left frontal craniotomy for tumor resection. No MRI evidence for residual tumor. 2. Small focus of restricted diffusion at the deep margin of the resection cavity consistent with a small peri resection infarct. 3. Mild interval worsening in vasogenic edema within the adjacent left cerebral hemisphere. No midline shift. Electronically Signed   By: BJeannine BogaM.D.   On: 05/02/2021 02:59   VAS [place] EXTREMITY VENOUS (DVT)  Result Date: 05/23/2021  Lower Venous DVT Study Patient Name:  SDEBBI TUKES Date  of Exam:   05/23/2021 Medical Rec #: [id number]     Accession #:    [id number] of  Birth: July 10, 1941     Patient Gender: F Patient Age:   23 years Exam Location:  [facility] Procedure:      VAS [place] LOWER EXTREMITY VENOUS (DVT) Referring Phys: Alger Simons --------------------------------------------------------------------------------  Indications: F/U DVT.  Risk Factors: DVT. Comparison Study: 05/05/2021 - RIGHT:                   - Findings consistent with age indeterminate deep vein                   thrombosis involving the right popliteal vein, right posterior                   tibial veins, and right peroneal veins.                   - Findings consistent with chronic deep vein thrombosis                   involving the right common femoral vein, and right femoral                   vein.                    LEFT:                   - Findings consistent with age indeterminate deep vein                   thrombosis                   involving the left posterior tibial veins, and left peroneal                   veins. Performing Technologist: Oliver Hum RVT  Examination Guidelines: A complete evaluation includes B-mode imaging, spectral Doppler, color Doppler, and power Doppler as needed of all accessible portions of each vessel. Bilateral testing is considered an integral part of a complete examination. Limited examinations for reoccurring indications may be performed as noted. The reflux portion of the exam is performed with the patient in reverse Trendelenburg.  +---------+---------------+---------+-----------+----------+--------------+ RIGHT    CompressibilityPhasicitySpontaneityPropertiesThrombus Aging +---------+---------------+---------+-----------+----------+--------------+ CFV      Full           Yes      Yes                                 +---------+---------------+---------+-----------+----------+--------------+ SFJ      Full                                                         +---------+---------------+---------+-----------+----------+--------------+ FV Prox  Full                                                        +---------+---------------+---------+-----------+----------+--------------+ FV Mid   Full                                                        +---------+---------------+---------+-----------+----------+--------------+  FV DistalFull                                                        +---------+---------------+---------+-----------+----------+--------------+ PFV      Full                                                        +---------+---------------+---------+-----------+----------+--------------+ POP      Partial        Yes      Yes                  Chronic        +---------+---------------+---------+-----------+----------+--------------+ PTV      Full                                                        +---------+---------------+---------+-----------+----------+--------------+ PERO     Full                                                        +---------+---------------+---------+-----------+----------+--------------+   +---------+---------------+---------+-----------+----------+--------------+ LEFT     CompressibilityPhasicitySpontaneityPropertiesThrombus Aging +---------+---------------+---------+-----------+----------+--------------+ CFV      Full           Yes      Yes                                 +---------+---------------+---------+-----------+----------+--------------+ SFJ      Full                                                        +---------+---------------+---------+-----------+----------+--------------+ FV Prox  Full                                                        +---------+---------------+---------+-----------+----------+--------------+ FV Mid   Full                                                         +---------+---------------+---------+-----------+----------+--------------+ FV DistalFull                                                        +---------+---------------+---------+-----------+----------+--------------+  PFV      Full                                                        +---------+---------------+---------+-----------+----------+--------------+ POP      Full           Yes      Yes                                 +---------+---------------+---------+-----------+----------+--------------+ PTV      Full                                                        +---------+---------------+---------+-----------+----------+--------------+ PERO     Full                                                        +---------+---------------+---------+-----------+----------+--------------+     Summary: RIGHT: - Findings consistent with chronic deep vein thrombosis involving the right popliteal vein. - No cystic structure found in the popliteal fossa.  LEFT: - There is no evidence of deep vein thrombosis in the lower extremity.  - No cystic structure found in the popliteal fossa.  *See table(s) above for measurements and observations. Electronically signed by Harold Barban MD on 05/23/2021 at 10:39:26 PM.    Final    VAS [place] LOWER EXTREMITY VENOUS (DVT)  Result Date: 05/05/2021  Lower Venous DVT Study Patient Name:  IMPI FICHTNER  Date of Exam:   05/05/2021 Medical Rec #: [id number]      Accession #:    [id number] Date of Birth: 01/15/1941     Patient Gender: F Patient Age:   49Y Exam Location:  [facility] Procedure:      VAS [place] LOWER EXTREMITY VENOUS (DVT) Referring Phys: Leonore --------------------------------------------------------------------------------  Indications: Edema, and Metastatic brain cancer, rehabilitation.  Anticoagulation: Patient was on Eliquis for atrial fibrillation but it was discontinued in March 2022 secondary to brain mass. Comparison  Study: No prior study Performing Technologist: Sharion Dove RVS  Examination Guidelines: A complete evaluation includes B-mode imaging, spectral Doppler, color Doppler, and power Doppler as needed of all accessible portions of each vessel. Bilateral testing is considered an integral part of a complete examination. Limited examinations for reoccurring indications may be performed as noted. The reflux portion of the exam is performed with the patient in reverse Trendelenburg.  +---------+---------------+---------+-----------+----------+-----------------+ RIGHT    CompressibilityPhasicitySpontaneityPropertiesThrombus Aging    +---------+---------------+---------+-----------+----------+-----------------+ CFV      Partial        Yes      Yes                  Chronic           +---------+---------------+---------+-----------+----------+-----------------+ SFJ      Full                                                           +---------+---------------+---------+-----------+----------+-----------------+  FV Prox  Partial                                      Chronic           +---------+---------------+---------+-----------+----------+-----------------+ FV Mid   Partial                                      Chronic           +---------+---------------+---------+-----------+----------+-----------------+ FV DistalFull                                                           +---------+---------------+---------+-----------+----------+-----------------+ PFV      Full                                                           +---------+---------------+---------+-----------+----------+-----------------+ POP      None                                         Age Indeterminate +---------+---------------+---------+-----------+----------+-----------------+ PTV      None                                         Age Indeterminate  +---------+---------------+---------+-----------+----------+-----------------+ PERO     None                                         Age Indeterminate +---------+---------------+---------+-----------+----------+-----------------+   +---------+---------------+---------+-----------+----------+-----------------+ LEFT     CompressibilityPhasicitySpontaneityPropertiesThrombus Aging    +---------+---------------+---------+-----------+----------+-----------------+ CFV      Full           Yes      Yes                                    +---------+---------------+---------+-----------+----------+-----------------+ SFJ      Full                                                           +---------+---------------+---------+-----------+----------+-----------------+ FV Prox  Full                                                           +---------+---------------+---------+-----------+----------+-----------------+ FV Mid   Full                                                           +---------+---------------+---------+-----------+----------+-----------------+  FV DistalFull                                                           +---------+---------------+---------+-----------+----------+-----------------+ PFV      Full                                                           +---------+---------------+---------+-----------+----------+-----------------+ POP      Full           Yes      Yes                                    +---------+---------------+---------+-----------+----------+-----------------+ PTV      Partial                                      Age Indeterminate +---------+---------------+---------+-----------+----------+-----------------+ PERO     Partial                                      Age Indeterminate +---------+---------------+---------+-----------+----------+-----------------+     Summary: RIGHT: - Findings consistent with age  indeterminate deep vein thrombosis involving the right popliteal vein, right posterior tibial veins, and right peroneal veins. - Findings consistent with chronic deep vein thrombosis involving the right common femoral vein, and right femoral vein.  LEFT: - Findings consistent with age indeterminate deep vein thrombosis involving the left posterior tibial veins, and left peroneal veins.  *See table(s) above for measurements and observations. Electronically signed by Deitra Mayo MD on 05/05/2021 at 6:44:38 PM.    Final        IMPRESSION/PLAN: 1. Persistent metastatic melanoma to the brain.  Dr. Lisbeth Renshaw discusses the findings and work-up she has undergone since completing stereotactic radiosurgery in March.  Unfortunately there has been both persistence of vasogenic edema and activity within the tumor.  She did undergo surgical resection and is recovering from this, she recognizes that this is a long-term commitment to her rehabilitation physically.  Given the histology, Dr. Lisbeth Renshaw discusses that he would favor a more ablative technique of trying to treat her surgical cavity in hopes of trying to prevent this from growing back again.  We discussed the risks, benefits, short and long-term effects of radiation he specifically discusses risks of radionecrosis with reirradiation recommends fractionating a stereotactic radiosurgery course of 5 treatments.  The patient is interested in remaining aggressive about her care as much is is reasonable.  She is planning to undergo repeat PET scan with Dr. Alen Blew and see him on Monday to discuss resuming immunotherapy as she has been off treatment due to this situation. Written consent is obtained and placed in the chart, a copy was provided to the patient. She will simulate today.  In a visit lasting 60 minutes, greater than 50% of the time was spent face to face discussing the patient's condition, in preparation for the discussion, and coordinating the  patient's care.    The above documentation reflects my direct findings during this shared patient visit. Please see the separate note by Dr. Lisbeth Renshaw on this date for the remainder of the patient's plan of care.    Carola Rhine, [facility] [place]   **Disclaimer: This note was dictated with voice recognition software. Similar sounding words can inadvertently be transcribed and this note may contain transcription errors which may not have been corrected upon publication of note.**

## 2021-05-31 NOTE — Progress Notes (Addendum)
Patient denies headaches, skin irritation, nausea, aphasia, or oral irritation. Reports mild fatigue, possible vision/ hearing reduction, balance issues, possible short term memory reduction, and moderately good motor skills.  Meaningful use questions complete.  BP (!) 97/55 (BP Location: Left Arm, Patient Position: Sitting, Cuff Size: Small)   Pulse 98   Temp 98.6 F (37 C) (Oral)   Resp 18   Ht '5\' 5"'$  (1.651 m)   Wt 150 lb (68 kg)   SpO2 100%   BMI 24.96 kg/m

## 2021-06-01 ENCOUNTER — Other Ambulatory Visit: Payer: Self-pay | Admitting: Radiation Therapy

## 2021-06-01 ENCOUNTER — Encounter (HOSPITAL_COMMUNITY)
Admission: RE | Admit: 2021-06-01 | Discharge: 2021-06-01 | Disposition: A | Discharge status: Home / Self Care | Payer: Medicare Other | Source: Ambulatory Visit | Attending: Oncology | Admitting: Oncology

## 2021-06-01 DIAGNOSIS — C211 Malignant neoplasm of anal canal: Secondary | ICD-10-CM | POA: Insufficient documentation

## 2021-06-01 DIAGNOSIS — C439 Malignant melanoma of skin, unspecified: Secondary | ICD-10-CM | POA: Diagnosis not present

## 2021-06-01 DIAGNOSIS — Z79899 Other long term (current) drug therapy: Secondary | ICD-10-CM | POA: Insufficient documentation

## 2021-06-01 DIAGNOSIS — C7949 Secondary malignant neoplasm of other parts of nervous system: Secondary | ICD-10-CM

## 2021-06-01 LAB — GLUCOSE, CAPILLARY: Glucose-Capillary: 107 mg/dL — ABNORMAL HIGH (ref 70–99)

## 2021-06-01 IMAGING — CT NM PET IMAGE RESTAGE (PS) WHOLE BODY
7 series · 25 of 25 positions shown · non-contrast
Comparison: [DATE]

CLINICAL DATA: Subsequent treatment strategy for melanoma.

EXAM:
NUCLEAR MEDICINE PET WHOLE BODY
TECHNIQUE: 7.4 mCi F-18 FDG was injected intravenously. Full-ring PET imaging
was performed from the head to foot after the radiotracer. CT data
was obtained and used for attenuation correction and anatomic
localization.
Fasting blood glucose: 107 mg/dl

[Series 3: pet wb ac · axial · 5.0mm · 4.07mm/px · z∈[-746,+1042]mm · 5 of 448 slices shown]
[im 1/448]
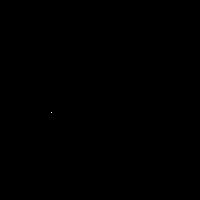
[im 112/448]
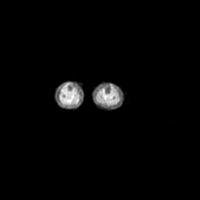
[im 224/448]
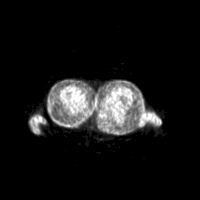
[im 336/448]
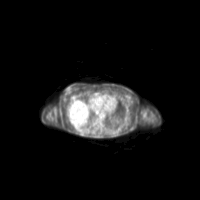
[im 448/448]
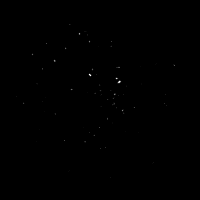

[Series 4: ct wb 5.0 hd_fov · axial · 5.0mm · 1.17mm/px · z∈[-750,+1042]mm · 6 of 449 slices shown]
[im 1/449  full-range]
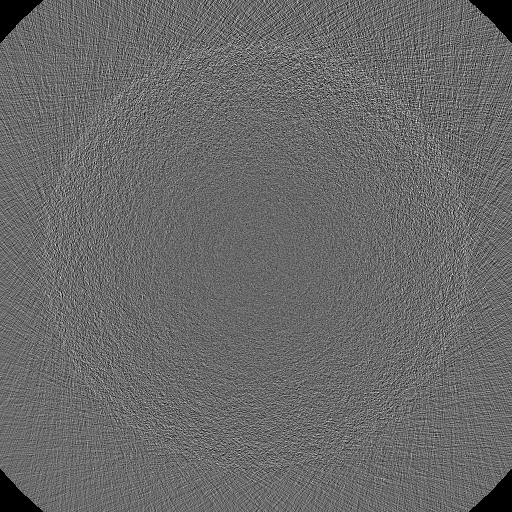
[im 90/449  soft-tissue]
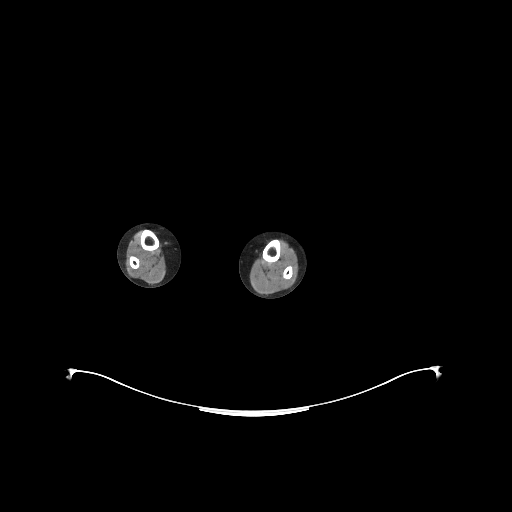
[im 180/449  soft-tissue]
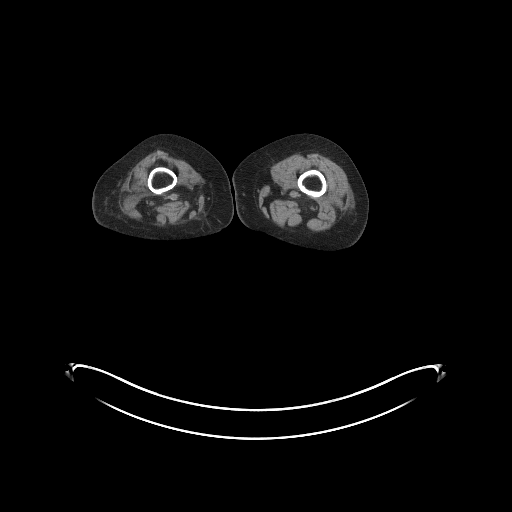
[im 269/449  soft-tissue]
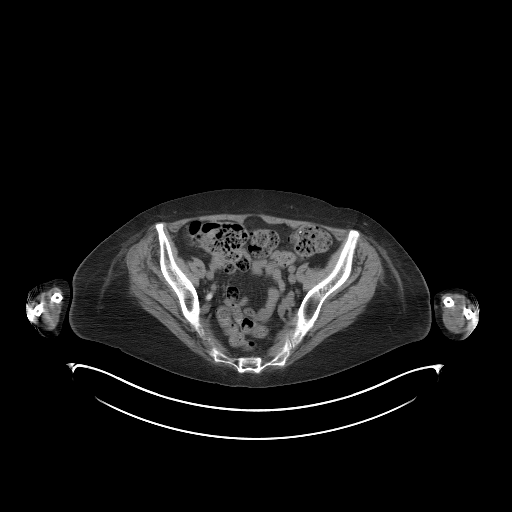
[im 359/449  soft-tissue]
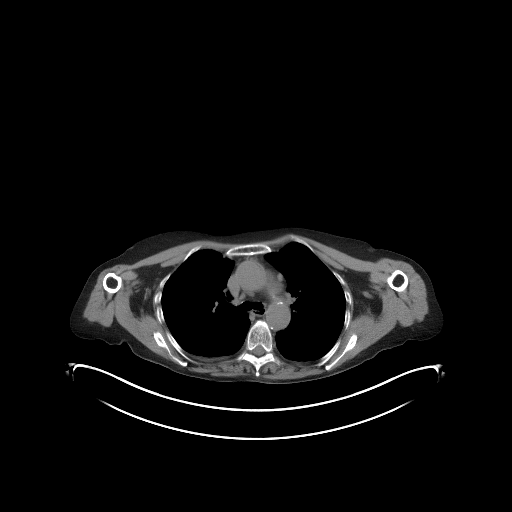
[im 449/449  soft-tissue]
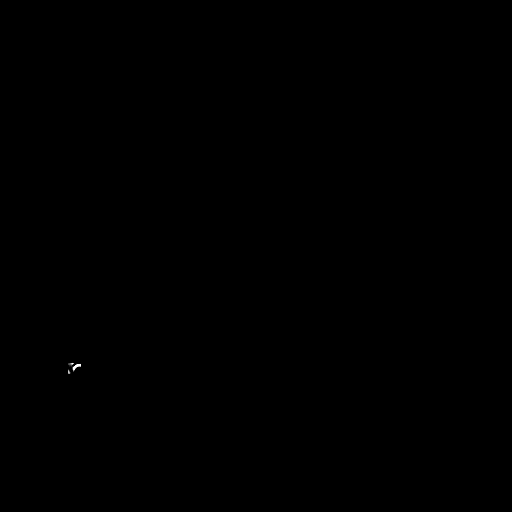

[Series 5: pet wb nac · axial · 5.0mm · 4.07mm/px · z∈[-746,+1042]mm · 6 of 448 slices shown]
[im 1/448  full-range]
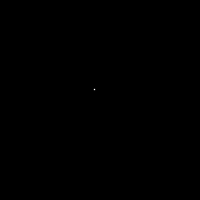
[im 90/448]
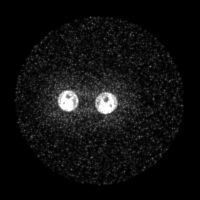
[im 179/448]
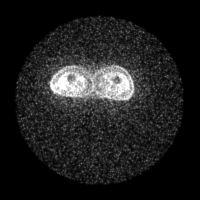
[im 269/448]
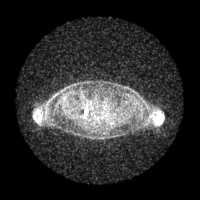
[im 358/448]
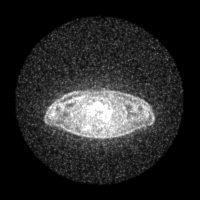
[im 448/448]
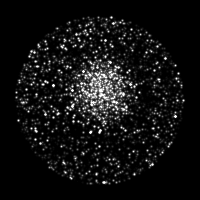

[Series 8: ct wb 5.0 br59 lung_bone · axial · 5.0mm · 0.62mm/px · 1 of 70 slices shown]
[im 1/70  lung]
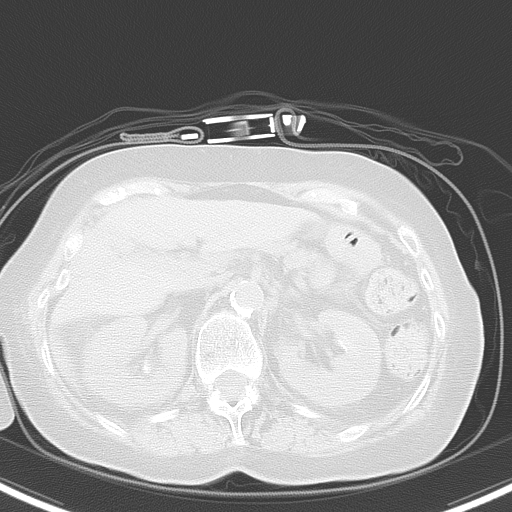

[Series 603: fused cor · 1 of 42 slices shown]
[im 1/42]
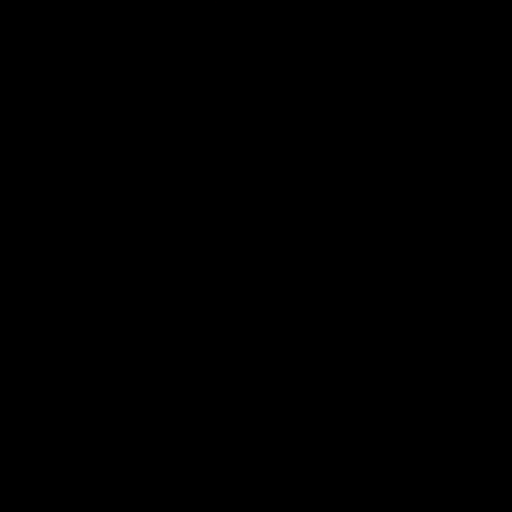

[Series 604: <mip collection> · coronal · 3.71mm/px · 1 of 32 slices shown]
[im 1/32]
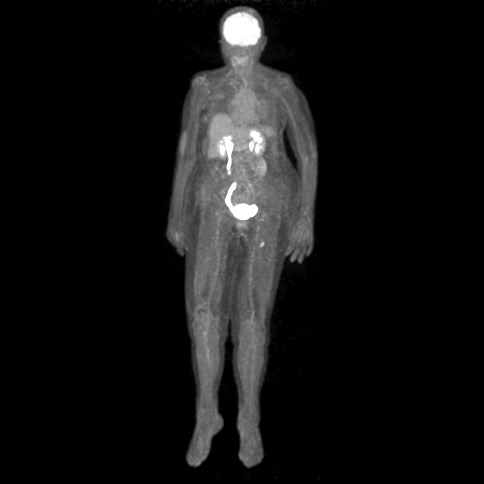

[Series 605: range-ct wb 5.0 hd_fov-tra-<alpha range> · 5 of 418 slices shown]
[im 1/418]
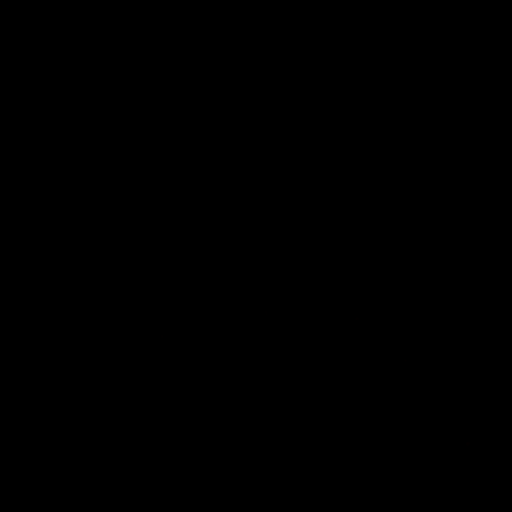
[im 105/418]
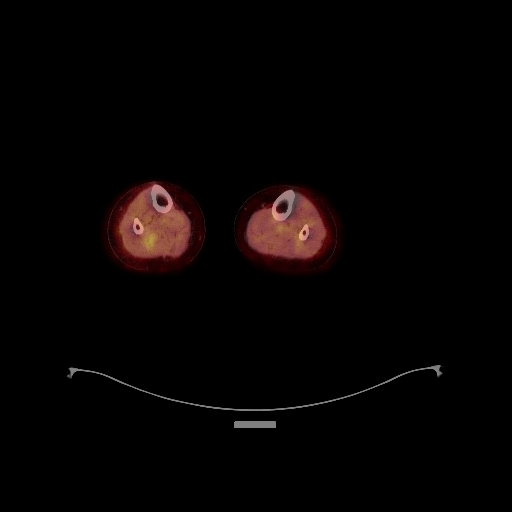
[im 209/418]
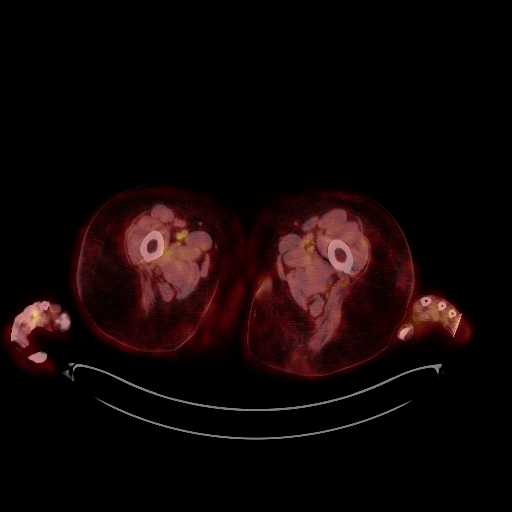
[im 313/418]
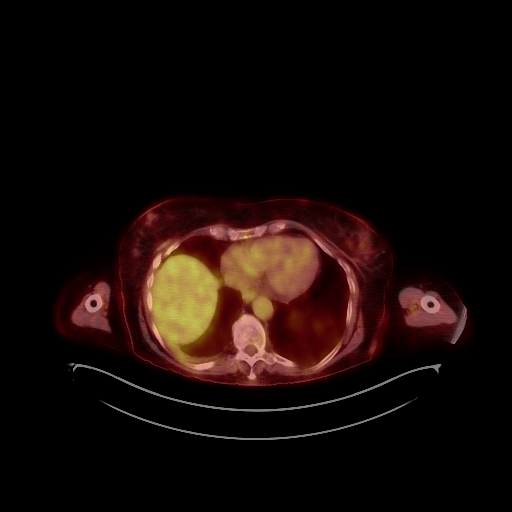
[im 418/418]
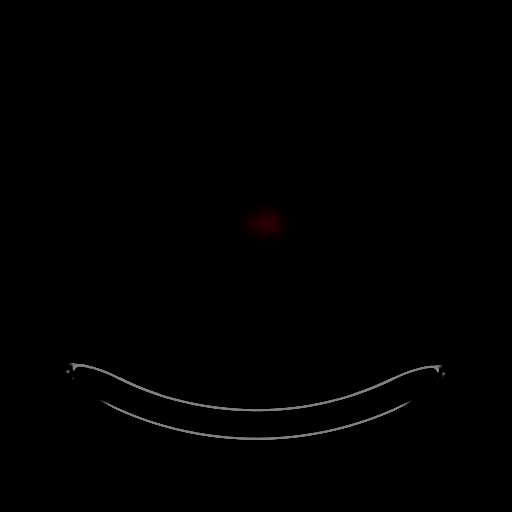

[25 of 25 positions shown; findings below may reference images not displayed]

FINDINGS: Mediastinal blood pool activity: SUV max

HEAD/NECK: No hypermetabolic activity in the scalp. No
hypermetabolic cervical lymph nodes.

Incidental CT findings: Encephalomalacia is identified within the
left frontal lobe with overlying changes due to prior craniotomy.

CHEST: No FDG avid axillary, supraclavicular, mediastinal, or hilar
lymph nodes. Within the medial left upper lobe there is a 6 mm lung
nodule with SUV max of 1.79, image [DATE]. This is new when compared
with the previous exam. Within the anteromedial aspect of the
lingula there is a new 6 mm nodule within SUV max of 2.4, image
44/8.

Incidental CT findings: New small bilateral pleural effusions. Focal
area of ground-glass attenuation within the posterior right lower
lobe measures 1.3 cm within SUV max of 2.14, image 35/8. likely
postinflammatory.

ABDOMEN/PELVIS: No abnormal FDG uptake identified within the liver,
pancreas, or adrenal glands. No FDG avid abdominopelvic lymph nodes.
Tiny FDG avid lymph node within the left groin has an SUV max of
2.6. This is compared with 3.80 previously.

Incidental CT findings: none

SKELETON: No suspicious FDG avid bone lesions.

Incidental CT findings: Previous focus of increased uptake about the
L5 spinous process has an SUV max of 2.79 on today's study. Formally
3.9. Favor degenerative uptake.

EXTREMITIES: FDG avid lesion within posterior left thigh musculature
has an SUV max 5.52. Previously 7.03.

Incidental CT findings: none
IMPRESSION: 1. Persistent FDG avid lesion involving the musculature of the
posterior left thigh compatible with metastatic disease. Decreased
FDG uptake associated with small left groin lymph node.
2. There are 2 new small nodules within the left upper lobe and
lingula which exhibit mild FDG uptake. Cannot exclude new sites of
disease.

## 2021-06-01 MED ORDER — FLUDEOXYGLUCOSE F - 18 (FDG) INJECTION
7.4000 | Freq: Once | INTRAVENOUS | Status: AC
  Administered 2021-06-01: 7.4 via INTRAVENOUS

## 2021-06-04 ENCOUNTER — Inpatient Hospital Stay: Payer: Medicare Other | Attending: Oncology | Admitting: Oncology

## 2021-06-04 ENCOUNTER — Other Ambulatory Visit: Payer: Self-pay

## 2021-06-04 VITALS — BP 102/60 | HR 85 | Temp 98.5°F | Resp 18 | Ht 65.0 in | Wt 150.0 lb

## 2021-06-04 DIAGNOSIS — C7940 Secondary malignant neoplasm of unspecified part of nervous system: Secondary | ICD-10-CM | POA: Insufficient documentation

## 2021-06-04 DIAGNOSIS — C439 Malignant melanoma of skin, unspecified: Secondary | ICD-10-CM | POA: Insufficient documentation

## 2021-06-04 DIAGNOSIS — I10 Essential (primary) hypertension: Secondary | ICD-10-CM | POA: Diagnosis not present

## 2021-06-04 DIAGNOSIS — G8191 Hemiplegia, unspecified affecting right dominant side: Secondary | ICD-10-CM | POA: Diagnosis not present

## 2021-06-04 DIAGNOSIS — C211 Malignant neoplasm of anal canal: Secondary | ICD-10-CM

## 2021-06-04 DIAGNOSIS — R4701 Aphasia: Secondary | ICD-10-CM | POA: Diagnosis not present

## 2021-06-04 DIAGNOSIS — Z79899 Other long term (current) drug therapy: Secondary | ICD-10-CM | POA: Insufficient documentation

## 2021-06-04 DIAGNOSIS — R918 Other nonspecific abnormal finding of lung field: Secondary | ICD-10-CM | POA: Insufficient documentation

## 2021-06-04 DIAGNOSIS — C434 Malignant melanoma of scalp and neck: Secondary | ICD-10-CM | POA: Diagnosis not present

## 2021-06-04 DIAGNOSIS — C7931 Secondary malignant neoplasm of brain: Secondary | ICD-10-CM | POA: Diagnosis not present

## 2021-06-04 DIAGNOSIS — I82431 Acute embolism and thrombosis of right popliteal vein: Secondary | ICD-10-CM | POA: Diagnosis not present

## 2021-06-04 MED ORDER — LIDOCAINE-PRILOCAINE 2.5-2.5 % EX CREA: 1.0000 "application " | TOPICAL_CREAM | CUTANEOUS | 0 refills | Status: DC | PRN

## 2021-06-04 NOTE — Progress Notes (Signed)
ON PATHWAY REGIMEN - Melanoma and Other Skin Cancers  No Change  Continue With Treatment as Ordered.  Original Decision Date/Time: 07/21/2019 11:06     A cycle is 21 days:     Pembrolizumab   **Always confirm dose/schedule in your pharmacy ordering system**  Patient Characteristics: Melanoma, Distant Metastases or Unresectable Local Recurrence, Unresectable, Asymptomatic, First Line, BRAF V600 Wild Type / BRAF V600 Results Pending or Unknown Disease Classification: Melanoma Disease Subtype: Mucosal Therapeutic Status: Distant Metastases BRAF V600 Mutation Status: Awaiting BRAF V600 Results Metastatic Disease Type: Asymptomatic Line of Therapy: First Line Intent of Therapy: Non-Curative / Palliative Intent, Discussed with Patient

## 2021-06-04 NOTE — Progress Notes (Signed)
DISCONTINUE ON PATHWAY REGIMEN - Melanoma and Other Skin Cancers     A cycle is 21 days:     Pembrolizumab   **Always confirm dose/schedule in your pharmacy ordering system**  REASON: Disease Progression PRIOR TREATMENT: [id number]: Pembrolizumab 200 mg q21 Days Until Progression or Unacceptable Toxicity TREATMENT RESPONSE: Stable Disease (SD)  START ON PATHWAY REGIMEN - Melanoma and Other Skin Cancers     A cycle is every 21 days:     Nivolumab      Ipilimumab    A cycle is every 28 days:     Nivolumab   **Always confirm dose/schedule in your pharmacy ordering system**  Patient Characteristics: Melanoma, Mucosal, Unresectable or Distant Metastases, Symptomatic, Second Line, BRAF V600 Wild Type / BRAF V600 Results Pending or Unknown, Candidate for Immunotherapy Disease Classification: Melanoma Disease Subtype: Mucosal BRAF V600 Mutation Status: Awaiting BRAF V600 Results Therapeutic Status: Distant Metastases Metastatic Disease Type: Symptomatic Line of Therapy: Second Line Immunotherapy Candidate Status: Candidate for Immunotherapy Intent of Therapy: Non-Curative / Palliative Intent, Discussed with Patient

## 2021-06-04 NOTE — Progress Notes (Signed)
Hematology and Oncology Follow Up Visit  EVADENE CAPPARELLI [id number] 1941/06/22 80 y.o. 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  She developed CNS disease in March 2022.  Prior Therapy:   She is status post perianal excision in August 2020.  Stereotactic radiosurgery completed on December 28, 2020.  He received 18 Gray in 1 fraction.  Pembrolizumab 200 mg every 3 weeks started on July 29, 2019.  Last treatment given on November 08, 2020.  She is status post left frontal craniotomy and resection of her residual melanoma completed on May 01, 2021.  Current therapy:   Under evaluation to start stereotactic radiosurgery postcraniotomy.    She is currently on Pembrolizumab 400 mg every 6 weeks started on Feb 15, 2021.  Last treatment given on March 29, 2021.  Interim History: Ms. Guarino presents today for a follow-up visit.  Since her last visit, she underwent left frontal craniotomy and resection of her melanoma completed in July 2022.  She presented acutely with right hemiplegia related to her melanoma hemorrhage and underwent surgical resection.  She required inpatient rehabilitation and recently discharged.    Since her discharge, she is improving slowly with improvement in her speech as well as right upper arm hemiparesis.  Her right leg remains weak however.  She is able to use her walker but still requiring lots of assistance from her son and family members.  He denies any nausea, vomiting or abdominal pain.  She denies any headaches but does report blurry vision.     Medications: Reviewed without changes. Current Outpatient Medications  Medication Sig Dispense Refill   acyclovir (ZOVIRAX) 400 MG tablet Take 400 mg by mouth 2 (two) times daily.     atorvastatin (LIPITOR) 10 MG tablet Take 1 tablet (10 mg total) by mouth daily. 90 tablet 0    bethanechol (URECHOLINE) 50 MG tablet Take 1 tablet (50 mg total) by mouth 3 (three) times daily. 90 tablet 0   camphor-menthol (SARNA) lotion Apply topically as needed for itching. 222 mL 0   Cholecalciferol (VITAMIN D-3) 125 MCG (5000 UT) TABS Take 5,000 Units by mouth daily. 30 tablet 3   levETIRAcetam (KEPPRA) 750 MG tablet Take 1 tablet (750 mg total) by mouth 2 (two) times daily. 60 tablet 0   metoprolol succinate (TOPROL-XL) 25 MG 24 hr tablet Take 1 tablet (25 mg total) by mouth 2 (two) times daily. 180 tablet 0   Multiple Vitamins-Minerals (PRESERVISION AREDS 2+MULTI VIT) CAPS Take 1 capsule by mouth 2 (two) times daily.     nutrition supplement, JUVEN, (JUVEN) PACK Take 1 packet by mouth 2 (two) times daily between meals. 60 packet 0   ondansetron (ZOFRAN) 4 MG tablet Take 1 tablet (4 mg total) by mouth every 4 (four) hours as needed for nausea or vomiting. 20 tablet 0   pantoprazole (PROTONIX) 40 MG tablet Take 1 tablet (40 mg total) by mouth at bedtime. 30 tablet 0   polyethylene glycol (MIRALAX / GLYCOLAX) 17 g packet Take 17 g by mouth 2 (two) times daily. 60 each 0   raloxifene (EVISTA) 60 MG tablet Take 60 mg by mouth in the morning.     senna-docusate (SENOKOT-S) 8.6-50 MG tablet Take 1 tablet by mouth 2 (two) times daily. 60 tablet 0   tamsulosin (FLOMAX) 0.4 MG CAPS capsule Take 1 capsule (0.4 mg total) by mouth daily after supper.  30 capsule 0   timolol (TIMOPTIC) 0.5 % ophthalmic solution Place 1 drop into both eyes 2 (two) times daily.     No current facility-administered medications for this visit.     Allergies: No Known Allergies     Physical Exam:    Blood pressure 102/60, pulse 85, temperature 98.5 F (36.9 C), temperature source Oral, resp. rate 18, height '5\' 5"'$  (1.651 m), weight 150 lb (68 kg), SpO2 100 %.     ECOG: 1    General appearance: Alert, awake without any distress. Head: Atraumatic without abnormalities Oropharynx: Without any thrush or  ulcers. Eyes: No scleral icterus. Lymph nodes: No lymphadenopathy noted in the cervical, supraclavicular, or axillary nodes Heart:regular rate and rhythm, without any murmurs or gallops.   Lung: Clear to auscultation without any rhonchi, wheezes or dullness to percussion. Abdomin: Soft, nontender without any shifting dullness or ascites. Musculoskeletal: No clubbing or cyanosis. Neurological: No motor or sensory deficits. Skin: No rashes or lesions.                               Lab Results: Lab Results  Component Value Date   WBC 14.8 (H) 05/28/2021   HGB 11.8 (L) 05/28/2021   HCT 36.5 05/28/2021   MCV 98.6 05/28/2021   PLT 318 05/28/2021     Chemistry      Component Value Date/Time   NA 137 05/28/2021 0636   NA 138 12/05/2016 0750   K 3.7 05/28/2021 0636   CL 105 05/28/2021 0636   CO2 25 05/28/2021 0636   BUN 18 05/28/2021 0636   BUN 15 12/05/2016 0750   CREATININE 0.56 05/28/2021 0636   CREATININE 0.68 03/29/2021 1130   CREATININE 0.70 09/05/2015 0828      Component Value Date/Time   CALCIUM 8.7 (L) 05/28/2021 0636   ALKPHOS 50 05/04/2021 0514   AST 21 05/04/2021 0514   AST 19 03/29/2021 1130   ALT 55 (H) 05/04/2021 0514   ALT 35 03/29/2021 1130   BILITOT 0.4 05/04/2021 0514   BILITOT 0.5 03/29/2021 1130         IMPRESSION: 1. Persistent FDG avid lesion involving the musculature of the posterior left thigh compatible with metastatic disease. Decreased FDG uptake associated with small left groin lymph node. 2. There are 2 new small nodules within the left upper lobe and lingula which exhibit mild FDG uptake. Cannot exclude new sites of disease.   Impression and Plan:  80 year old with:   1.   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    Her disease status was updated at this time and treatment choices were discussed.  Her PET scan obtained on June 01, 2021 was personally reviewed and showed persistent uptake  in the left thigh and possible pulmonary nodules.  Given these findings, I recommended switching to immunotherapy doublets utilizing ipilimumab and nivolumab.  Complication associated with this treatment include nausea, fatigue and autoimmune issues.  Risk of autoimmune complications much higher with immune doublets.  Given her disease progression in the brain I feel that it is her best option moving forward.  She is agreeable to proceed and we will tentatively start on September 15 as scheduled.   2.  Immune mediated complications: Complications such as pneumonitis, colitis, hepatitis other issues were reiterated again.   3.  IV access: Risks and benefits of a Port-A-Cath insertion were discussed at this time.  Complications including bleeding, thrombosis  and infection were reviewed.  She is agreeable to proceed.   4.  Antiemetics: Compazine is available to her.  No nausea or vomiting reported.  5.  CNS metastasis: She is status post surgical resection and she will receive follow-up for radiation.  She has a repeat MRI on August 30.  6.  Breast cancer: No evidence of relapse noted at this time.  7.  Pulmonary nodules: Unclear etiology at this time.  There is a risk of metastatic melanoma and subsequent imaging studies will further determine that.  8.   Follow-up: Already scheduled for the start of immunotherapy.  40 minutes were dedicated to this visit.  The time was spent on reviewing her disease status, treatment choices, complication related therapy and future plan of care discussion.   Zola Button, MD 8/29/20229:53 AM

## 2021-06-05 ENCOUNTER — Other Ambulatory Visit: Payer: Self-pay | Admitting: Student

## 2021-06-05 ENCOUNTER — Telehealth: Payer: Self-pay | Admitting: Oncology

## 2021-06-05 ENCOUNTER — Telehealth: Payer: Self-pay | Admitting: *Deleted

## 2021-06-05 ENCOUNTER — Ambulatory Visit
Admission: RE | Admit: 2021-06-05 | Discharge: 2021-06-05 | Disposition: A | Discharge status: Home / Self Care | Payer: Medicare Other | Source: Ambulatory Visit | Attending: Internal Medicine | Admitting: Internal Medicine

## 2021-06-05 DIAGNOSIS — D496 Neoplasm of unspecified behavior of brain: Secondary | ICD-10-CM | POA: Diagnosis not present

## 2021-06-05 DIAGNOSIS — C7931 Secondary malignant neoplasm of brain: Secondary | ICD-10-CM

## 2021-06-05 DIAGNOSIS — G936 Cerebral edema: Secondary | ICD-10-CM | POA: Diagnosis not present

## 2021-06-05 DIAGNOSIS — Z85820 Personal history of malignant melanoma of skin: Secondary | ICD-10-CM | POA: Diagnosis not present

## 2021-06-05 DIAGNOSIS — C719 Malignant neoplasm of brain, unspecified: Secondary | ICD-10-CM | POA: Diagnosis not present

## 2021-06-05 IMAGING — MR MR HEAD WO/W CM
12 series · 48 of 48 positions shown · IV contrast (13ML MULTI)
Comparison: [DATE]

CLINICAL DATA: Brain/CNS neoplasm, assess treatment response.
Metastatic melanoma. History of left frontal tumor resection.

EXAM:
MRI HEAD WITHOUT AND WITH CONTRAST
TECHNIQUE: Multiplanar, multiecho pulse sequences of the brain and surrounding
structures were obtained without and with intravenous contrast.
CONTRAST:  13mL MULTIHANCE GADOBENATE DIMEGLUMINE 529 MG/ML IV SOLN

[Series 2: FLAIR · sagittal · 3.0mm · 0.75mm/px · 3 of 40 slices shown (1 of 2)]
[im 1/40]
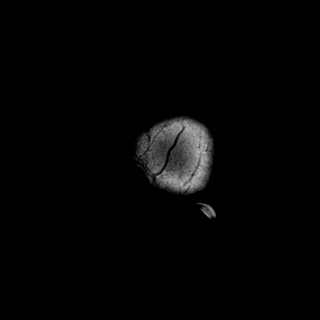
[im 20/40]
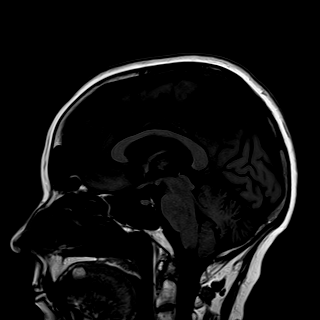
[im 40/40]
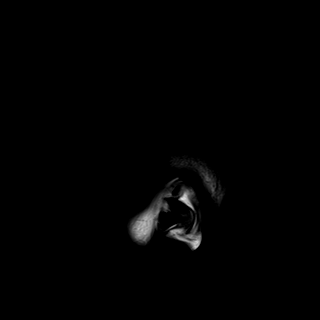

[Series 3: DWI · axial · 3.0mm · 1.50mm/px · z∈[-91,+80]mm · 4 of 90 slices shown (1 of 2)]
[im 1/90]
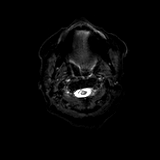
[im 30/90]
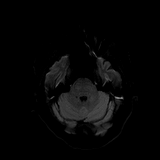
[im 60/90]
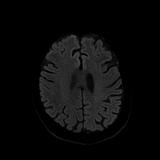
[im 90/90]
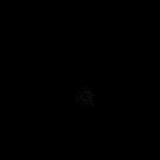

[Series 4: DWI · axial · 3.0mm · 1.50mm/px · z∈[-91,+80]mm · 2 of 45 slices shown (2 of 2)]
[im 1/45]
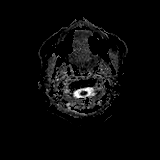
[im 45/45]
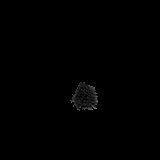

[Series 5: T2 · axial · 5.0mm · 0.57mm/px · 1 of 29 slices shown (1 of 2)]
[im 1/29]
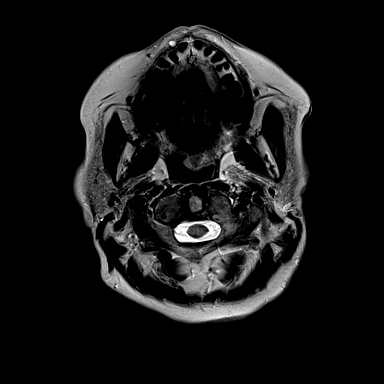

[Series 6: swi_images · axial · 1.5mm · 0.90mm/px · z∈[-88,+78]mm · 5 of 112 slices shown]
[im 1/112]
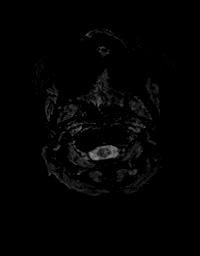
[im 28/112]
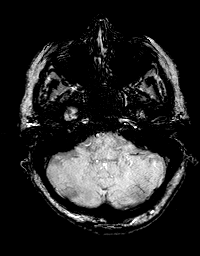
[im 56/112]
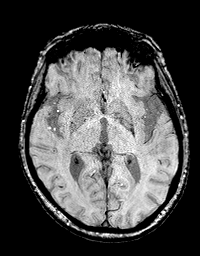
[im 84/112]
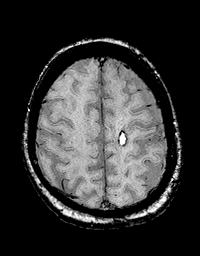
[im 112/112]
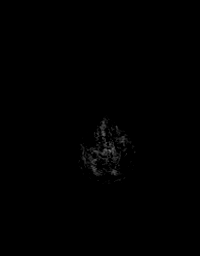

[Series 8: FLAIR · axial · 3.0mm · 0.86mm/px · z∈[-97,+86]mm · 3 of 62 slices shown (2 of 2)]
[im 1/62]
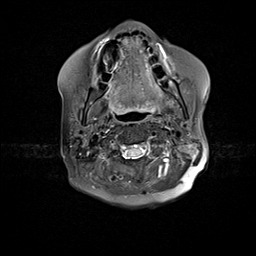
[im 31/62]
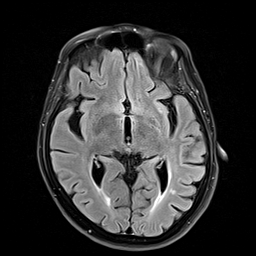
[im 62/62]
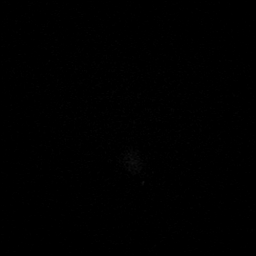

[Series 9: T2 · axial · non-contrast · 1.0mm · 0.86mm/px · z∈[-89,+83]mm · 8 of 176 slices shown (2 of 2)]
[im 1/176]
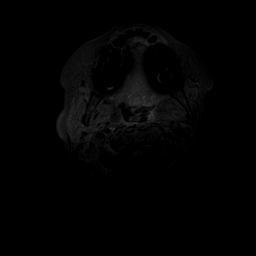
[im 26/176]
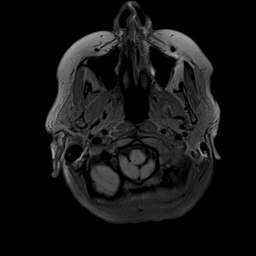
[im 51/176]
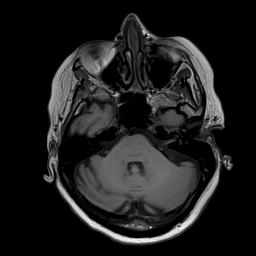
[im 76/176]
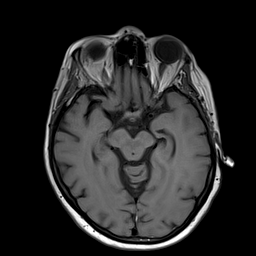
[im 101/176]
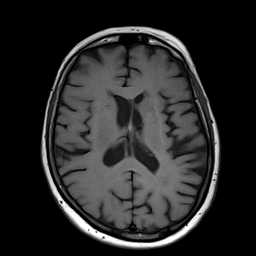
[im 126/176]
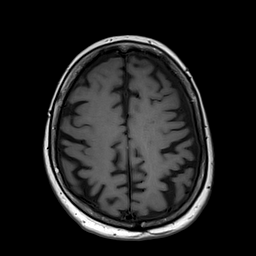
[im 151/176]
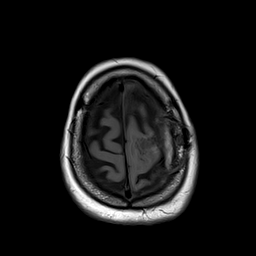
[im 176/176]
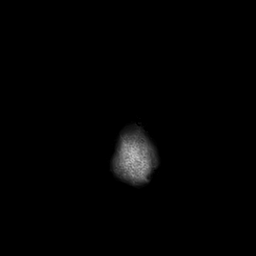

[Series 10: T2 post-contrast · coronal · 3.0mm · 0.57mm/px · 2 of 47 slices shown (1 of 2)]
[im 1/47]
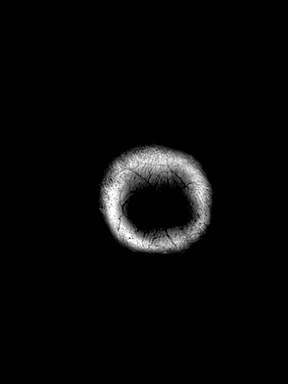
[im 47/47]
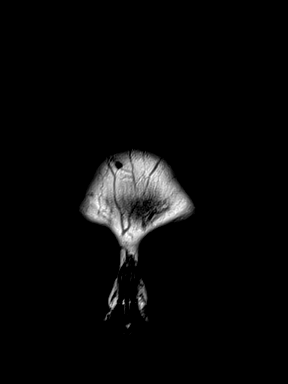

[Series 11: T2 post-contrast · axial · 1.0mm · 0.86mm/px · z∈[-89,+83]mm · 8 of 176 slices shown (2 of 2)]
[im 1/176]
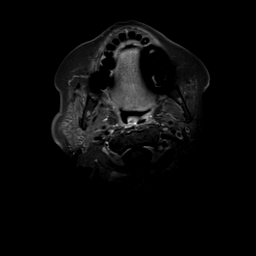
[im 26/176]
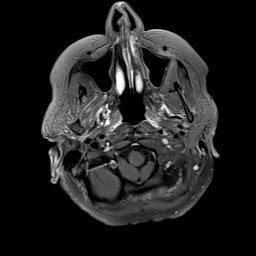
[im 51/176]
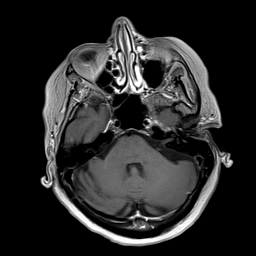
[im 76/176]
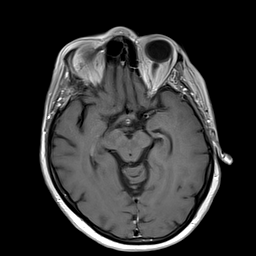
[im 101/176]
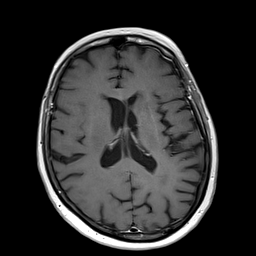
[im 126/176]
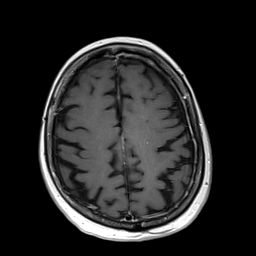
[im 151/176]
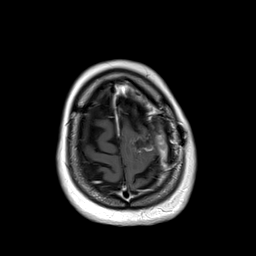
[im 176/176]
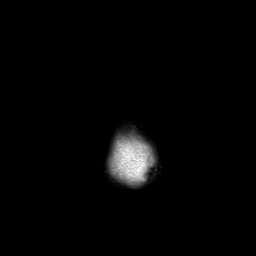

[Series 12: T1 post-contrast · axial · 1.0mm · 0.75mm/px · z∈[-82,+77]mm · 8 of 160 slices shown (1 of 2)]
[im 1/160]
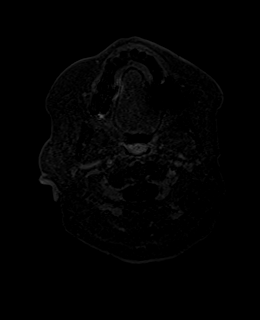
[im 23/160]
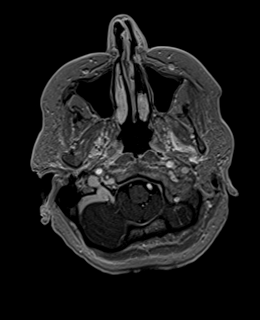
[im 46/160]
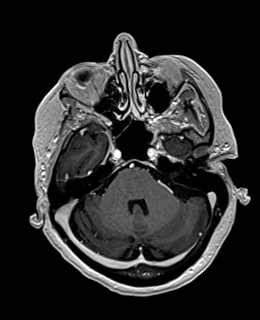
[im 69/160]
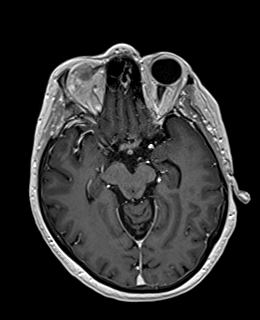
[im 91/160]
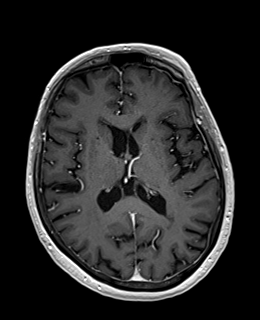
[im 114/160]
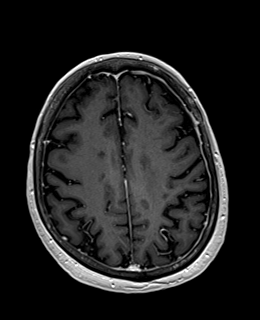
[im 137/160]
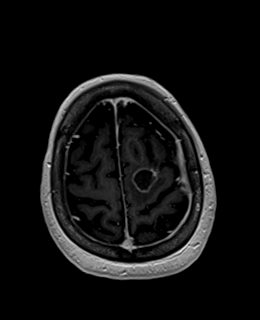
[im 160/160]
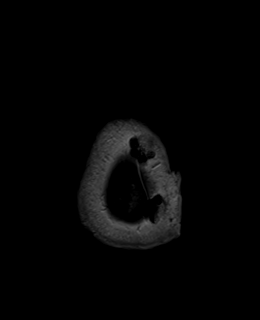

[Series 13: T1 post-contrast · coronal · 3.0mm · 0.57mm/px · 2 of 47 slices shown (2 of 2)]
[im 1/47]
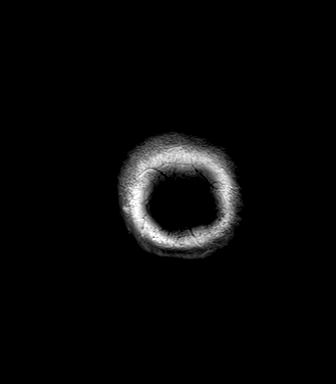
[im 47/47]
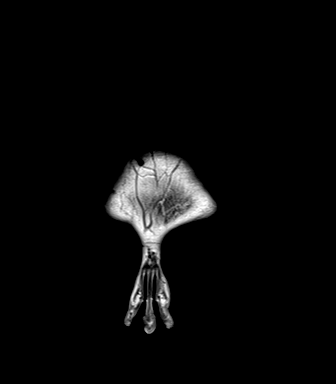

[Series 14: FLAIR post-contrast · sagittal · 3.0mm · 0.75mm/px · 2 of 40 slices shown]
[im 1/40]
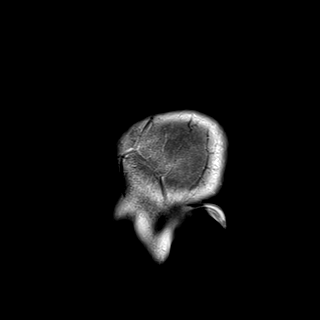
[im 40/40]
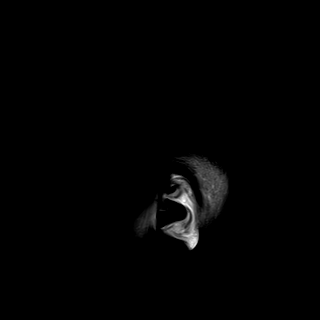

[48 of 48 positions shown; findings below may reference images not displayed]

FINDINGS: Brain: Sequelae of left frontal craniotomy and tumor resection are
again identified. A 2 cm resection cavity in the posterior left
frontal lobe has slightly decreased in size and contains chronic
blood products. Mild enhancement along the margins of the resection
cavity has increased from the immediate postoperative study without
substantial nodularity. There is also minimal enhancement at the
inferolateral aspects of the resection cavity which partially
corresponds to the small infarct demonstrated on the prior MRI.
There is mild surrounding edema which has greatly decreased. A small
extra-axial fluid collection is noted subjacent to the craniotomy
with mild extension along the falx without significant mass effect.

No new enhancing intracranial lesion is identified. There is no
acute infarct or midline shift. T2 hyperintensities elsewhere in the
cerebral white matter bilaterally and in the pons are unchanged and
nonspecific but compatible with mild chronic small vessel ischemic
disease. Chronic lacunar infarcts are again noted in the left basal
ganglia and corona radiata. There is mild cerebral atrophy.

Vascular: Attenuated appearance of the left MCA related to known
chronic occlusion.

Skull and upper cervical spine: Left frontal craniotomy. No
suspicious marrow lesion.

Sinuses/Orbits: Right cataract extraction. Mild posterior ethmoid
air cell mucosal thickening on the left. No significant mastoid
fluid.

Other: None.
IMPRESSION: 1. Increased, mild enhancement along the margins of the left frontal
resection cavity favored to be postsurgical and postischemic.
Decreased edema.
2. No evidence of new intracranial metastases.

## 2021-06-05 MED ORDER — GADOBENATE DIMEGLUMINE 529 MG/ML IV SOLN
13.0000 mL | Freq: Once | INTRAVENOUS | Status: AC | PRN
  Administered 2021-06-05: 13 mL via INTRAVENOUS

## 2021-06-05 NOTE — Telephone Encounter (Signed)
Called patient regarding upcoming September and October appointments, left a voicemail.

## 2021-06-05 NOTE — Telephone Encounter (Signed)
[facility] called for POC 2wk2, 1wk6.  Approval given.

## 2021-06-06 ENCOUNTER — Other Ambulatory Visit: Payer: Self-pay | Admitting: Oncology

## 2021-06-06 ENCOUNTER — Other Ambulatory Visit: Payer: Self-pay

## 2021-06-06 ENCOUNTER — Encounter (HOSPITAL_COMMUNITY): Payer: Self-pay

## 2021-06-06 ENCOUNTER — Ambulatory Visit (HOSPITAL_COMMUNITY)
Admission: RE | Admit: 2021-06-06 | Discharge: 2021-06-06 | Disposition: A | Discharge status: Home / Self Care | Payer: Medicare Other | Source: Ambulatory Visit | Attending: Oncology | Admitting: Oncology

## 2021-06-06 DIAGNOSIS — C4351 Malignant melanoma of anal skin: Secondary | ICD-10-CM | POA: Diagnosis not present

## 2021-06-06 DIAGNOSIS — C7931 Secondary malignant neoplasm of brain: Secondary | ICD-10-CM | POA: Diagnosis not present

## 2021-06-06 DIAGNOSIS — I10 Essential (primary) hypertension: Secondary | ICD-10-CM | POA: Insufficient documentation

## 2021-06-06 DIAGNOSIS — I4891 Unspecified atrial fibrillation: Secondary | ICD-10-CM | POA: Insufficient documentation

## 2021-06-06 DIAGNOSIS — Z79899 Other long term (current) drug therapy: Secondary | ICD-10-CM | POA: Insufficient documentation

## 2021-06-06 DIAGNOSIS — R4701 Aphasia: Secondary | ICD-10-CM | POA: Diagnosis not present

## 2021-06-06 DIAGNOSIS — G8191 Hemiplegia, unspecified affecting right dominant side: Secondary | ICD-10-CM | POA: Diagnosis not present

## 2021-06-06 DIAGNOSIS — C211 Malignant neoplasm of anal canal: Secondary | ICD-10-CM | POA: Diagnosis not present

## 2021-06-06 DIAGNOSIS — E7800 Pure hypercholesterolemia, unspecified: Secondary | ICD-10-CM | POA: Insufficient documentation

## 2021-06-06 DIAGNOSIS — I82431 Acute embolism and thrombosis of right popliteal vein: Secondary | ICD-10-CM | POA: Diagnosis not present

## 2021-06-06 DIAGNOSIS — Z452 Encounter for adjustment and management of vascular access device: Secondary | ICD-10-CM | POA: Diagnosis not present

## 2021-06-06 DIAGNOSIS — C434 Malignant melanoma of scalp and neck: Secondary | ICD-10-CM | POA: Diagnosis not present

## 2021-06-06 HISTORY — PX: IR IMAGING GUIDED PORT INSERTION: IMG5740

## 2021-06-06 HISTORY — PX: IR US GUIDE VASC ACCESS LEFT: IMG2389

## 2021-06-06 IMAGING — XA IR IMAGING GUIDED PORT INSERTION
2 series · 3 of 3 positions shown · non-contrast
Comparison: none

INDICATION: Malignant melanoma of the anal canal with brain metastases. She
requires durable venous access for chemotherapy. The patient's first
2 peripheral IVs infiltrated. Therefore, it was deemed necessary to
place an ultrasound-guided IV and 1 of the deeper arm veins to allow
for moderate sedation during the port catheter placement.

[Series 1: ir fluoro/shunt/fist · 2 of 2 slices shown]
[im 1/2]
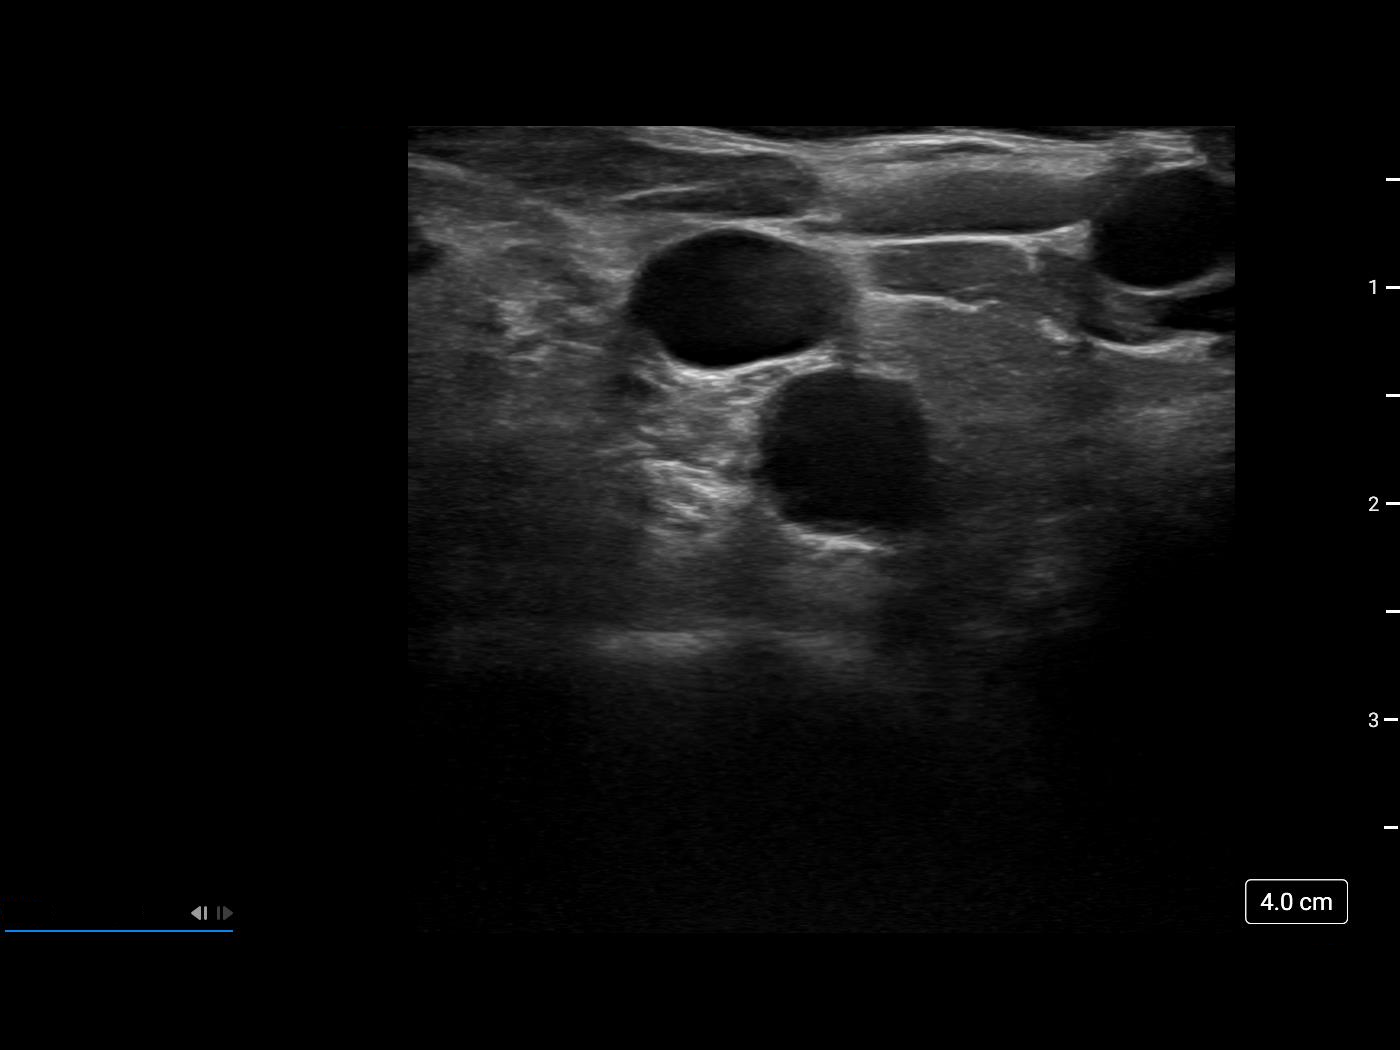
[im 2/2]
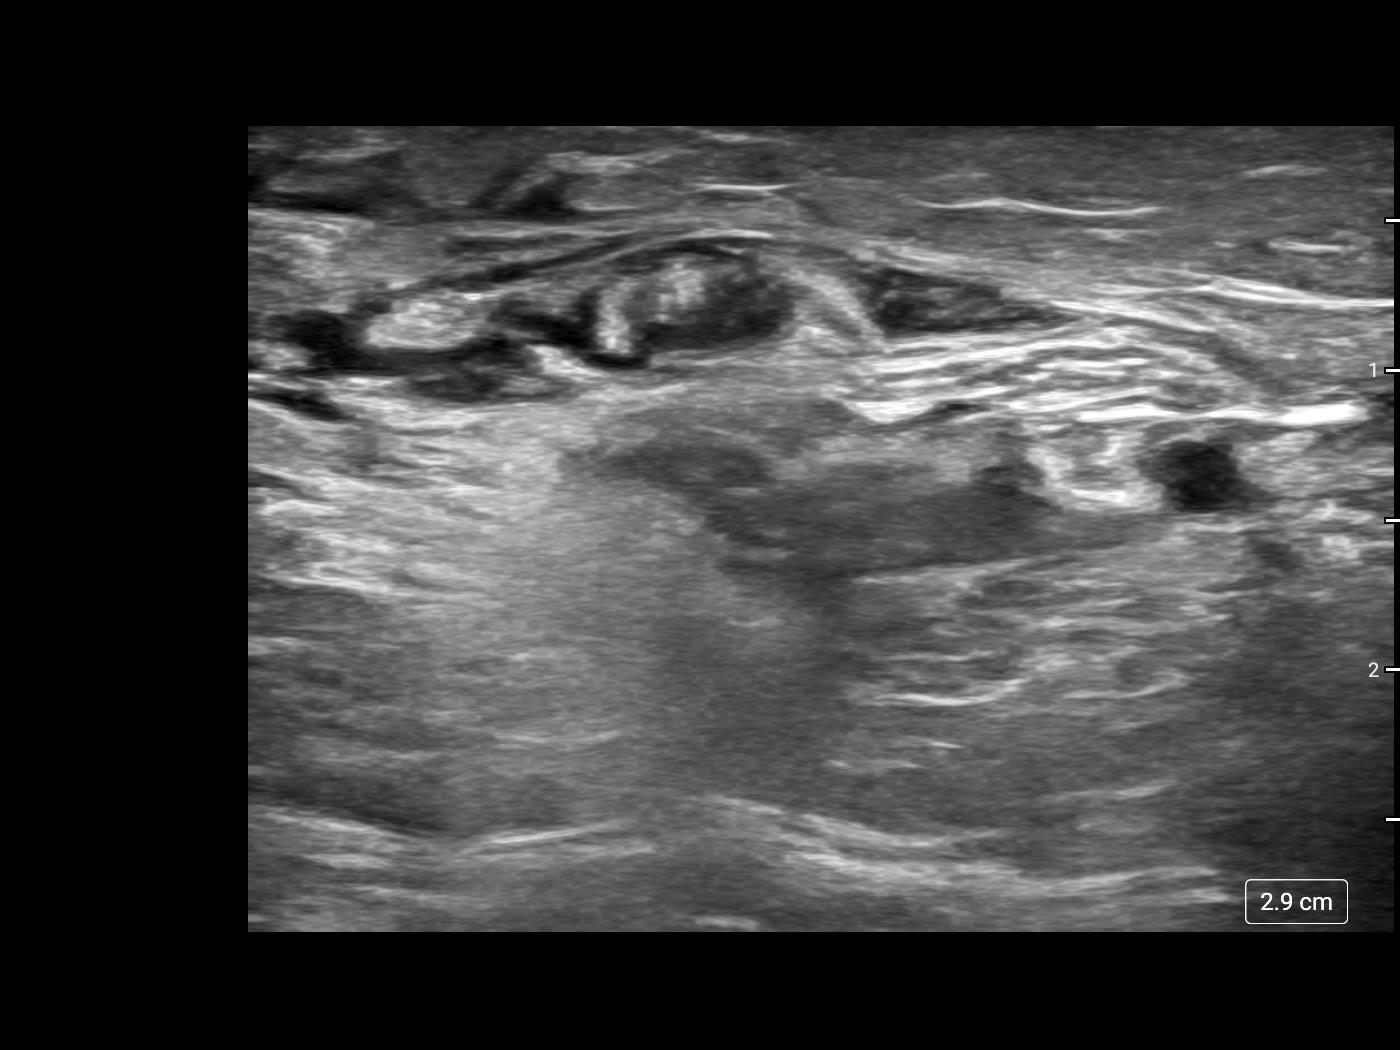

[Series 300: line placements · 1 of 1 slices shown]
[im 1/1]
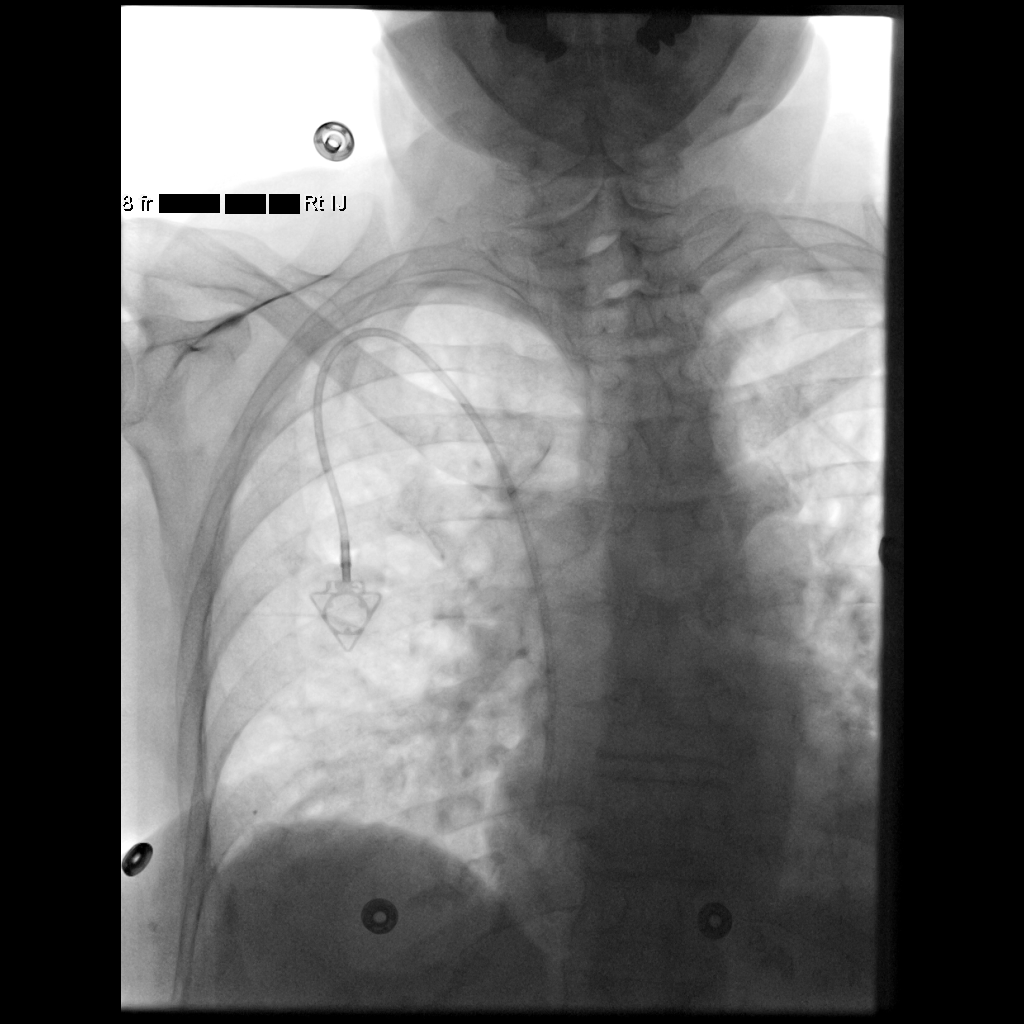

[3 of 3 positions shown; findings below may reference images not displayed]

EXAM:
US GUIDED IV START BY KALER

IMPLANTED PORT A CATH PLACEMENT WITH ULTRASOUND AND FLUOROSCOPIC
GUIDANCE

MEDICATIONS:
None

ANESTHESIA/SEDATION:
Versed 1 mg IV; Fentanyl 50 mcg IV;

Moderate Sedation Time:  23 minutes

The patient was continuously monitored during the procedure by the
interventional radiology nurse under my direct supervision.

FLUOROSCOPY TIME:  0 minutes, 6 seconds (less than 1 mGy)

COMPLICATIONS:
None immediate.

PROCEDURE:
The right neck and chest was prepped with chlorhexidine, and draped
in the usual sterile fashion using maximum barrier technique (cap
and mask, sterile gown, sterile gloves, large sterile sheet, hand
hygiene and cutaneous antiseptic). Local anesthesia was attained by
infiltration with 1% lidocaine with epinephrine.

The left cephalic vein was interrogated with ultrasound and found to
be widely patent. An image was obtained and stored for the medical
record. Local anesthesia was attained by infiltration with 1%
lidocaine. A small dermatotomy was made. Under real-time sonographic
guidance, the vessel was punctured with a 21 gauge micropuncture
needle. Using standard technique, the initial micro needle was
exchanged over a 0.018 micro wire for a transitional 4 French micro
sheath. The micro sheath was then capped with a valved side-arm. The
catheter flushes and aspirates easily and will serve well as an IV.
The catheter was secured in place with a sterile bandage.

Ultrasound demonstrated patency of the right internal jugular vein,
and this was documented with an image. Under real-time ultrasound
guidance, this vein was accessed with a 21 gauge micropuncture
needle and image documentation was performed. A small dermatotomy
was made at the access site with an 11 scalpel. A 0.018" wire was
advanced into the SVC and the access needle exchanged for a 4F
micropuncture vascular sheath. The 0.018" wire was then removed and
a 0.035" wire advanced into the IVC.



The venous access site was then serially dilated and a peel away
vascular sheath placed over the wire. The wire was removed and the
port catheter advanced into position under fluoroscopic guidance.
The catheter tip is positioned in the superior cavoatrial junction.
This was documented with a spot image. The portacatheter was then
tested and found to flush and aspirate well. The port was flushed
with saline followed by 100 units/mL heparinized saline.

The pocket was then closed using subdermal inverted interrupted
absorbable sutures followed by Dermabond. The dermatotomy at the
venous access site was also closed with Dermabond.
IMPRESSION: Successful placement of a right IJ approach Power Port with
ultrasound and fluoroscopic guidance. The catheter is ready for use.

## 2021-06-06 MED ORDER — MIDAZOLAM HCL 2 MG/2ML IJ SOLN
INTRAMUSCULAR | Status: AC
  Filled 2021-06-06: qty 2

## 2021-06-06 MED ORDER — FENTANYL CITRATE (PF) 100 MCG/2ML IJ SOLN
INTRAMUSCULAR | Status: AC | PRN
  Administered 2021-06-06 (×2): 50 ug via INTRAVENOUS

## 2021-06-06 MED ORDER — HEPARIN SOD (PORK) LOCK FLUSH 100 UNIT/ML IV SOLN
INTRAVENOUS | Status: AC
  Filled 2021-06-06: qty 5

## 2021-06-06 MED ORDER — SODIUM CHLORIDE 0.9 % IV SOLN: INTRAVENOUS | Status: DC

## 2021-06-06 MED ORDER — MIDAZOLAM HCL 2 MG/2ML IJ SOLN
INTRAMUSCULAR | Status: AC | PRN
  Administered 2021-06-06 (×2): 1 mg via INTRAVENOUS

## 2021-06-06 MED ORDER — LIDOCAINE-EPINEPHRINE 1 %-1:100000 IJ SOLN
INTRAMUSCULAR | Status: AC
  Filled 2021-06-06: qty 1

## 2021-06-06 MED ORDER — FENTANYL CITRATE (PF) 100 MCG/2ML IJ SOLN
INTRAMUSCULAR | Status: AC
  Filled 2021-06-06: qty 2

## 2021-06-06 MED ORDER — LIDOCAINE-EPINEPHRINE 1 %-1:100000 IJ SOLN
INTRAMUSCULAR | Status: AC | PRN
  Administered 2021-06-06 (×2): 10 mL via INTRADERMAL

## 2021-06-06 MED ORDER — HEPARIN SOD (PORK) LOCK FLUSH 100 UNIT/ML IV SOLN
INTRAVENOUS | Status: AC | PRN
  Administered 2021-06-06: 500 [IU] via INTRAVENOUS

## 2021-06-06 NOTE — Discharge Instructions (Signed)

## 2021-06-06 NOTE — Procedures (Signed)
Interventional Radiology Procedure Note  Procedure: Placement of a right IJ approach single lumen PowerPort.  Tip is positioned at the superior cavoatrial junction and catheter is ready for immediate use.  Complications: No immediate Recommendations:  - Ok to shower tomorrow - Do not submerge for 7 days - Routine line care   Signed,  Dejan Angert K. Hiromi Knodel, MD   

## 2021-06-06 NOTE — H&P (Signed)
Chief Complaint: Patient was seen in consultation today for Port-A-Cath placement at the request of Shadad,Firas N  Referring Physician(s): [name]  Supervising Physician: Jacqulynn Cadet  Patient Status: [name]  History of Present Illness: Angela Carpenter is a 80 y.o. female with PMHs of HTN, A. 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 Alen Blew from hematology oncology and underwent radiation and chemo/immunotherapy as well as left frontal craniotomy and resection in July 2022. Patient underwent PET scan on 06/01/2021 which showed persistent uptake in the left thigh and possible pulmonary nodules, and change in immunotherapy regimen was recommended to the patient during follow-up visit with Dr. Alen Blew on 06/05/2019.  After thorough discussion and shared decision making, patient decided to proceed with new immunotherapy regimen.   IR was requested for image guided Port-A-Cath placement for central venous access.  Patient presents to [facility] IR for the procedure today. Patient laying in bed, not in acute distress.  Denise headache, fever, chills, shortness of breath, cough, chest pain, abdominal pain, nausea ,vomiting, and bleeding.   Past Medical History:  Diagnosis Date   A-fib [facility])    Atrial fibrillation ([place])    4 years ago started   Brain cancer ([place]) 12/2020   Breast cancer ([place]) 2006   Colon polyps    Glaucoma    Hypercholesteremia    Hypertension    patient denies    Osteoporosis    Personal history of chemotherapy 2006   Personal history of radiation therapy 2006   Stroke (cerebrum) ([place]) 2015    Past Surgical History:  Procedure Laterality Date   APPLICATION OF CRANIAL NAVIGATION Left 05/01/2021   Procedure: APPLICATION OF CRANIAL NAVIGATION;  Surgeon: Consuella Lose, MD;  Location: [place];  Service:  Neurosurgery;  Laterality: Left;   AUGMENTATION MAMMAPLASTY Bilateral 2006   Patient had them removed in 2008   BREAST BIOPSY Left 2016   BREAST LUMPECTOMY Right 2006   COLONOSCOPY     had polyps   CRANIOTOMY Left 05/01/2021   Procedure: STEREOTACTIC FRONTAL CRANIOTOMY TUMOR EXCISION;  Surgeon: Consuella Lose, MD;  Location: [place];  Service: Neurosurgery;  Laterality: Left;   EYE SURGERY Right    cataract   MELANOMA EXCISION WITH SENTINEL LYMPH NODE BIOPSY N/A 06/02/2019   Procedure: Left lateral ANAL MELANOMA EXCISION WITH Bilateral Groin exploration and SENTINEL node mapping;  Surgeon: [name], MD;  Location: [place];  Service: General;  Laterality: N/A;   melanoma removal Left    crown   TONSILLECTOMY      Allergies: Patient has no known allergies.  Medications: Prior to Admission medications   Medication Sig Start Date End Date Taking? Authorizing Provider  acyclovir (ZOVIRAX) 400 MG tablet Take 400 mg by mouth 2 (two) times daily.    [provider]  atorvastatin (LIPITOR) 10 MG tablet Take 1 tablet (10 mg total) by mouth daily. 03/08/21   Freada Bergeron, MD  bethanechol (URECHOLINE) 50 MG tablet Take 1 tablet (50 mg total) by mouth 3 (three) times daily. 05/29/21   Love, Ivan Anchors, PA-C  camphor-menthol [facility]) lotion Apply topically as needed for itching. 05/29/21   Love, Ivan Anchors, PA-C  Cholecalciferol (VITAMIN D-3) 125 MCG (5000 UT) TABS Take 5,000 Units by mouth daily. 05/29/21   [name], MD  levETIRAcetam (KEPPRA) 750 MG tablet Take 1 tablet (750 mg  total) by mouth 2 (two) times daily. 05/29/21   Love, Ivan Anchors, PA-C  lidocaine-prilocaine (EMLA) cream Apply 1 application topically as needed. 06/04/21   [name], MD  metoprolol succinate (TOPROL-XL) 25 MG 24 hr tablet Take 1 tablet (25 mg total) by mouth 2 (two) times daily. 03/08/21   Freada Bergeron, MD  Multiple Vitamins-Minerals (PRESERVISION AREDS 2+MULTI VIT) CAPS Take 1 capsule by  mouth 2 (two) times daily.    [provider]  nutrition supplement, JUVEN, (JUVEN) PACK Take 1 packet by mouth 2 (two) times daily between meals. 05/29/21   Love, Ivan Anchors, PA-C  ondansetron (ZOFRAN) 4 MG tablet Take 1 tablet (4 mg total) by mouth every 4 (four) hours as needed for nausea or vomiting. 05/29/21   Love, Ivan Anchors, PA-C  pantoprazole (PROTONIX) 40 MG tablet Take 1 tablet (40 mg total) by mouth at bedtime. 05/29/21   Love, Ivan Anchors, PA-C  polyethylene glycol (MIRALAX / GLYCOLAX) 17 g packet Take 17 g by mouth 2 (two) times daily. 05/29/21   Love, Ivan Anchors, PA-C  raloxifene (EVISTA) 60 MG tablet Take 60 mg by mouth in the morning.    [provider]  senna-docusate (SENOKOT-S) 8.6-50 MG tablet Take 1 tablet by mouth 2 (two) times daily. 05/29/21   Love, Ivan Anchors, PA-C  tamsulosin (FLOMAX) 0.4 MG CAPS capsule Take 1 capsule (0.4 mg total) by mouth daily after supper. 05/29/21   Love, Ivan Anchors, PA-C  timolol (TIMOPTIC) 0.5 % ophthalmic solution Place 1 drop into both eyes 2 (two) times daily. 11/19/16   [provider]     Family History  Problem Relation Age of Onset   Lung cancer Mother    Hypotension Mother    Pancreatic cancer Father    Hypertension Sister    Kidney cancer Brother    Cirrhosis Maternal Grandfather    Breast cancer Paternal Grandmother    Cirrhosis Paternal Grandfather    Breast cancer Paternal Aunt     Social History   Socioeconomic History   Marital status: Widowed    Spouse name: Not on file   Number of children: 1   Years of education: Not on file   Highest education level: Not on file  Occupational History   Occupation: retired  Tobacco Use   Smoking status: Never    Passive exposure: Yes   Smokeless tobacco: Never   Tobacco comments:    smoked in college but didn't inhale  Vaping Use   Vaping Use: Never used  Substance and Sexual Activity   Alcohol use: No    Alcohol/week: 0.0 standard drinks   Drug use: No   Sexual  activity: Not Currently  Other Topics Concern   Not on file  Social History Narrative   Not on file   Social Determinants of Health   Financial Resource Strain: Not on file  Food Insecurity: Not on file  Transportation Needs: Not on file  Physical Activity: Not on file  Stress: Not on file  Social Connections: Not on file     Review of Systems: A 12 point ROS discussed and pertinent positives are indicated in the HPI above.  All other systems are negative.  Vital Signs: BP (!) 99/57   Pulse 82   Temp 98.8 F (37.1 C) (Oral)   Resp 18   Ht '5\' 5"'$  (1.651 m)   Wt 150 lb (68 kg)   SpO2 100%   BMI 24.96 kg/m    Physical  Exam Vitals and nursing note reviewed.  Constitutional:      General: She is not in acute distress.    Appearance: Normal appearance. She is not ill-appearing.  HENT:     Head: Normocephalic and atraumatic.     Mouth/Throat:     Mouth: Mucous membranes are moist.     Pharynx: Oropharynx is clear.  Cardiovascular:     Rate and Rhythm: Normal rate and regular rhythm.     Pulses: Normal pulses.     Heart sounds: Normal heart sounds.  Pulmonary:     Effort: Pulmonary effort is normal.     Breath sounds: Normal breath sounds.  Abdominal:     General: Abdomen is flat. Bowel sounds are normal.     Palpations: Abdomen is soft.  Musculoskeletal:     Cervical back: Neck supple.  Skin:    General: Skin is warm and dry.     Coloration: Skin is not jaundiced or pale.  Neurological:     Mental Status: She is alert and oriented to person, place, and time.     Comments: Right arm weakness   Psychiatric:        Mood and Affect: Mood normal.        Behavior: Behavior normal.        Judgment: Judgment normal.    MD Evaluation Airway: WNL Heart: WNL Abdomen: WNL Chest/ Lungs: WNL ASA  Classification: 3 Mallampati/Airway Score: Two  Imaging: NM PET Image Restage (PS) Whole Body  Result Date: 06/01/2021 CLINICAL DATA:  Subsequent treatment strategy for  melanoma. EXAM: NUCLEAR MEDICINE PET WHOLE BODY TECHNIQUE: 7.4 mCi F-18 FDG was injected intravenously. Full-ring PET imaging was performed from the head to foot after the radiotracer. CT data was obtained and used for attenuation correction and anatomic localization. Fasting blood glucose: 107 mg/dl COMPARISON:  10/31/2020 FINDINGS: Mediastinal blood pool activity: SUV max 3.11 HEAD/NECK: No hypermetabolic activity in the scalp. No hypermetabolic cervical lymph nodes. Incidental CT findings: Encephalomalacia is identified within the left frontal lobe with overlying changes due to prior craniotomy. CHEST: No FDG avid axillary, supraclavicular, mediastinal, or hilar lymph nodes. Within the medial left upper lobe there is a 6 mm lung nodule with SUV max of 1.79, image 22/8. This is new when compared with the previous exam. Within the anteromedial aspect of the lingula there is a new 6 mm nodule within SUV max of 2.4, image 44/8. Incidental CT findings: New small bilateral pleural effusions. Focal area of ground-glass attenuation within the posterior right lower lobe measures 1.3 cm within SUV max of 2.14, image 35/8. likely postinflammatory. ABDOMEN/PELVIS: No abnormal FDG uptake identified within the liver, pancreas, or adrenal glands. No FDG avid abdominopelvic lymph nodes. Tiny FDG avid lymph node within the left groin has an SUV max of 2.6. This is compared with 3.80 previously. Incidental CT findings: none SKELETON: No suspicious FDG avid bone lesions. Incidental CT findings: Previous focus of increased uptake about the L5 spinous process has an SUV max of 2.79 on today's study. Formally 3.9. Favor degenerative uptake. EXTREMITIES: FDG avid lesion within posterior left thigh musculature has an SUV max 5.52. Previously 7.03. Incidental CT findings: none IMPRESSION: 1. Persistent FDG avid lesion involving the musculature of the posterior left thigh compatible with metastatic disease. Decreased FDG uptake associated  with small left groin lymph node. 2. There are 2 new small nodules within the left upper lobe and lingula which exhibit mild FDG uptake. Cannot exclude new sites of disease. Electronically  Signed   By: Kerby Moors M.D.   On: 06/01/2021 15:17   VAS [place] LOWER EXTREMITY VENOUS (DVT)  Result Date: 05/23/2021  Lower Venous DVT Study Patient Name:  Angela Carpenter  Date of Exam:   05/23/2021 Medical Rec #: [id number]      Accession #:    [id number] Date of Birth: 1940/12/24     Patient Gender: F Patient Age:   8 years Exam Location:  [facility] Procedure:      VAS [place] LOWER EXTREMITY VENOUS (DVT) Referring Phys: Alger Simons --------------------------------------------------------------------------------  Indications: F/U DVT.  Risk Factors: DVT. Comparison Study: 05/05/2021 - RIGHT:                   - Findings consistent with age indeterminate deep vein                   thrombosis involving the right popliteal vein, right posterior                   tibial veins, and right peroneal veins.                   - Findings consistent with chronic deep vein thrombosis                   involving the right common femoral vein, and right femoral                   vein.                    LEFT:                   - Findings consistent with age indeterminate deep vein                   thrombosis                   involving the left posterior tibial veins, and left peroneal                   veins. Performing Technologist: Oliver Hum RVT  Examination Guidelines: A complete evaluation includes B-mode imaging, spectral Doppler, color Doppler, and power Doppler as needed of all accessible portions of each vessel. Bilateral testing is considered an integral part of a complete examination. Limited examinations for reoccurring indications may be performed as noted. The reflux portion of the exam is performed with the patient in reverse Trendelenburg.   +---------+---------------+---------+-----------+----------+--------------+ RIGHT    CompressibilityPhasicitySpontaneityPropertiesThrombus Aging +---------+---------------+---------+-----------+----------+--------------+ CFV      Full           Yes      Yes                                 +---------+---------------+---------+-----------+----------+--------------+ SFJ      Full                                                        +---------+---------------+---------+-----------+----------+--------------+ FV Prox  Full                                                        +---------+---------------+---------+-----------+----------+--------------+  FV Mid   Full                                                        +---------+---------------+---------+-----------+----------+--------------+ FV DistalFull                                                        +---------+---------------+---------+-----------+----------+--------------+ PFV      Full                                                        +---------+---------------+---------+-----------+----------+--------------+ POP      Partial        Yes      Yes                  Chronic        +---------+---------------+---------+-----------+----------+--------------+ PTV      Full                                                        +---------+---------------+---------+-----------+----------+--------------+ PERO     Full                                                        +---------+---------------+---------+-----------+----------+--------------+   +---------+---------------+---------+-----------+----------+--------------+ LEFT     CompressibilityPhasicitySpontaneityPropertiesThrombus Aging +---------+---------------+---------+-----------+----------+--------------+ CFV      Full           Yes      Yes                                  +---------+---------------+---------+-----------+----------+--------------+ SFJ      Full                                                        +---------+---------------+---------+-----------+----------+--------------+ FV Prox  Full                                                        +---------+---------------+---------+-----------+----------+--------------+ FV Mid   Full                                                        +---------+---------------+---------+-----------+----------+--------------+  FV DistalFull                                                        +---------+---------------+---------+-----------+----------+--------------+ PFV      Full                                                        +---------+---------------+---------+-----------+----------+--------------+ POP      Full           Yes      Yes                                 +---------+---------------+---------+-----------+----------+--------------+ PTV      Full                                                        +---------+---------------+---------+-----------+----------+--------------+ PERO     Full                                                        +---------+---------------+---------+-----------+----------+--------------+     Summary: RIGHT: - Findings consistent with chronic deep vein thrombosis involving the right popliteal vein. - No cystic structure found in the popliteal fossa.  LEFT: - There is no evidence of deep vein thrombosis in the lower extremity.  - No cystic structure found in the popliteal fossa.  *See table(s) above for measurements and observations. Electronically signed by Harold Barban MD on 05/23/2021 at 10:39:26 PM.    Final     Labs:  CBC: Recent Labs    05/07/21 0458 05/14/21 0553 05/21/21 0649 05/28/21 0636  WBC 21.4* 15.8* 14.5* 14.8*  HGB 13.7 12.9 11.8* 11.8*  HCT 40.1 38.3 36.3 36.5  PLT 231 248 277 318    COAGS: Recent  Labs    12/11/20 1703 04/23/21 1315  INR 1.1 1.0  APTT 32  --     BMP: Recent Labs    06/09/20 1124 06/30/20 0840 07/21/20 1237 05/07/21 0458 05/14/21 0553 05/21/21 0649 05/28/21 0636  NA 138 140   < > 135 135 139 137  K 4.4 4.2   < > 4.1 3.8 3.6 3.7  CL 103 103   < > 98 101 104 105  CO2 26 30   < > '27 27 27 25  '$ GLUCOSE 87 104*   < > 86 93 87 90  BUN 11 11   < > '19 21 12 18  '$ CALCIUM 9.1 9.4   < > 9.0 8.8* 8.6* 8.7*  CREATININE 0.64 0.71   < > 0.39* 0.53 0.51 0.56  GFRNONAA >60 >60   < > >60 >60 >60 >60  GFRAA >60 >60  --   --   --   --   --    < > =  values in this interval not displayed.    LIVER FUNCTION TESTS: Recent Labs    02/15/21 1132 03/29/21 1130 04/23/21 1315 05/04/21 0514  BILITOT 0.5 0.5 1.0 0.4  AST '20 19 24 21  '$ ALT 15 35 42 55*  ALKPHOS 72 59 61 50  PROT 6.6 6.4* 6.5 4.5*  ALBUMIN 3.8 3.7 3.8 2.3*    TUMOR MARKERS: No results for input(s): AFPTM, CEA, CA199, CHROMGRNA in the last 8760 hours.  Assessment and Plan: 80 y.o. 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  A change in immunotherapy regimen was recommended patient, and after thorough discussion of treatment decision making patient decided to proceed with the new regimen.  IR was requested for image guided Port-A-Cath placement. Patient presents to [facility] IR today for the procedure. N.p.o. since midnight VS mild hypotension at baseline, 99/57 this morning pt asymptomatic  Not on AC/AP treatment.  Risks and benefits of image guided port-a-catheter placement was discussed with the patient including, but not limited to bleeding, infection, pneumothorax, or fibrin sheath development and need for additional procedures.  All of the patient's questions were answered, patient is agreeable to proceed. Consent signed and in chart.   Thank you for this interesting consult.  I greatly  enjoyed meeting Angela Carpenter and look forward to participating in their care.  A copy of this report was sent to the requesting provider on this date.  Electronically Signed: Tera Mater, PA-C 06/06/2021, 8:15 AM   I spent a total of  30 Minutes   in face to face in clinical consultation, greater than 50% of which was counseling/coordinating care for guided Port-A-Cath placement.

## 2021-06-06 NOTE — Sedation Documentation (Signed)
Asked patient how she feels after first dose of sedation medication. Patient stated she "don't really feel any different". Reassessed IV site. IV noted infiltrated above left bicep. IV fluid stopped and IV removed. Dr. Laurence Ferrari notified and he will obtain IV access via ultrasound.

## 2021-06-07 ENCOUNTER — Ambulatory Visit: Payer: Medicare Other | Admitting: Radiation Oncology

## 2021-06-07 ENCOUNTER — Ambulatory Visit: Payer: Medicare Other

## 2021-06-07 DIAGNOSIS — C434 Malignant melanoma of scalp and neck: Secondary | ICD-10-CM | POA: Diagnosis not present

## 2021-06-07 DIAGNOSIS — I10 Essential (primary) hypertension: Secondary | ICD-10-CM | POA: Diagnosis not present

## 2021-06-07 DIAGNOSIS — G8191 Hemiplegia, unspecified affecting right dominant side: Secondary | ICD-10-CM | POA: Diagnosis not present

## 2021-06-07 DIAGNOSIS — C7931 Secondary malignant neoplasm of brain: Secondary | ICD-10-CM | POA: Diagnosis not present

## 2021-06-07 DIAGNOSIS — R4701 Aphasia: Secondary | ICD-10-CM | POA: Diagnosis not present

## 2021-06-07 DIAGNOSIS — I82431 Acute embolism and thrombosis of right popliteal vein: Secondary | ICD-10-CM | POA: Diagnosis not present

## 2021-06-08 ENCOUNTER — Ambulatory Visit: Payer: Medicare Other | Admitting: Internal Medicine

## 2021-06-08 ENCOUNTER — Ambulatory Visit: Payer: Medicare Other | Admitting: Radiation Oncology

## 2021-06-08 ENCOUNTER — Telehealth: Payer: Self-pay

## 2021-06-08 ENCOUNTER — Ambulatory Visit: Payer: Medicare Other

## 2021-06-08 DIAGNOSIS — I82431 Acute embolism and thrombosis of right popliteal vein: Secondary | ICD-10-CM | POA: Diagnosis not present

## 2021-06-08 DIAGNOSIS — R4701 Aphasia: Secondary | ICD-10-CM | POA: Diagnosis not present

## 2021-06-08 DIAGNOSIS — G8191 Hemiplegia, unspecified affecting right dominant side: Secondary | ICD-10-CM | POA: Diagnosis not present

## 2021-06-08 DIAGNOSIS — C7931 Secondary malignant neoplasm of brain: Secondary | ICD-10-CM | POA: Diagnosis not present

## 2021-06-08 DIAGNOSIS — C434 Malignant melanoma of scalp and neck: Secondary | ICD-10-CM | POA: Diagnosis not present

## 2021-06-08 DIAGNOSIS — I10 Essential (primary) hypertension: Secondary | ICD-10-CM | POA: Diagnosis not present

## 2021-06-08 DIAGNOSIS — C211 Malignant neoplasm of anal canal: Secondary | ICD-10-CM | POA: Diagnosis not present

## 2021-06-08 NOTE — Telephone Encounter (Signed)
[place] Therapist [phone]) called seeking speech therapy once a week for 3 weeks. Verbal okay given for in-home visits. [facility] notes reviewed per protocol).

## 2021-06-12 ENCOUNTER — Ambulatory Visit: Payer: Medicare Other

## 2021-06-12 ENCOUNTER — Ambulatory Visit
Admission: RE | Admit: 2021-06-12 | Discharge: 2021-06-12 | Disposition: A | Discharge status: Home / Self Care | Payer: Medicare Other | Source: Ambulatory Visit | Attending: Radiation Oncology | Admitting: Radiation Oncology

## 2021-06-12 ENCOUNTER — Other Ambulatory Visit: Payer: Self-pay

## 2021-06-12 DIAGNOSIS — Z85820 Personal history of malignant melanoma of skin: Secondary | ICD-10-CM | POA: Diagnosis not present

## 2021-06-12 DIAGNOSIS — C7931 Secondary malignant neoplasm of brain: Secondary | ICD-10-CM | POA: Diagnosis not present

## 2021-06-12 NOTE — Progress Notes (Signed)
Patient rested with [place] for 15 minutes following her SRS treatment.  Patient denies headache, dizziness, nausea, diplopia or ringing in the ears. Denies fatigue. Patient without complaints. Understands to avoid strenuous activity for the next 24 hours and call [phone] with needs.   Vitals:   06/12/21 1636  BP: (!) 122/53  Pulse: 94  Resp: 20  Temp: (!) 97.2 F (36.2 C)  TempSrc: Temporal  SpO2: 100%

## 2021-06-12 NOTE — Op Note (Signed)
Name: Angela Carpenter    MRN: [id number]   Date: 06/12/2021    DOB: Jul 07, 1941   STEREOTACTIC RADIOSURGERY OPERATIVE NOTE  PRE-OPERATIVE DIAGNOSIS:  Metastatic Melanoma  POST-OPERATIVE DIAGNOSIS:  Same  PROCEDURE:  Stereotactic Radiosurgery  SURGEON:  Consuella Lose, MD  RADIATION ONCOLOGIST: Dr. Kyung Rudd, MD  TECHNIQUE:  The patient underwent a radiation treatment planning session in the radiation oncology simulation suite under the care of the radiation oncology physician and physicist.  I participated closely in the radiation treatment planning afterwards. The patient underwent planning CT which was fused to 3T high resolution MRI with 1 mm axial slices.  These images were fused on the planning system.  We contoured the gross target volumes and subsequently expanded this to yield the Planning Target Volume. I actively participated in the planning process.  I helped to define and review the target contours and also the contours of the optic pathway, eyes, brainstem and selected nearby organs at risk.  All the dose constraints for critical structures were reviewed and compared to AAPM Task Group 101.  The prescription dose conformity was reviewed.  I approved the plan electronically.    Accordingly, Angela Carpenter  was brought to the TrueBeam stereotactic radiation treatment linac and placed in the custom immobilization mask.  The patient was aligned according to the IR fiducial markers with BrainLab Exactrac, then orthogonal x-rays were used in ExacTrac with the 6DOF robotic table and the shifts were made to align the patient  Angela Carpenter received the 1st of 5 planned fractions of stereotactic radiosurgery to a prescription dose of 5Gy uneventfully to the left frontoparietal tumor uneventfully.  The detailed description of the procedure is recorded in the radiation oncology procedure note.  I was present for the duration of the procedure.  DISPOSITION:   Following delivery, the patient  was transported to nursing in stable condition and monitored for possible acute effects to be discharged to home in stable condition with follow-up in one month.  Consuella Lose, MD [facility]

## 2021-06-13 ENCOUNTER — Encounter: Payer: Self-pay | Admitting: Physical Medicine & Rehabilitation

## 2021-06-13 ENCOUNTER — Ambulatory Visit: Payer: Medicare Other

## 2021-06-13 ENCOUNTER — Encounter: Payer: Medicare Other | Attending: Physical Medicine & Rehabilitation | Admitting: Physical Medicine & Rehabilitation

## 2021-06-13 VITALS — BP 113/73 | HR 88 | Temp 99.0°F | Ht 65.0 in | Wt 150.0 lb

## 2021-06-13 DIAGNOSIS — G8191 Hemiplegia, unspecified affecting right dominant side: Secondary | ICD-10-CM

## 2021-06-13 DIAGNOSIS — I119 Hypertensive heart disease without heart failure: Secondary | ICD-10-CM

## 2021-06-13 DIAGNOSIS — I480 Paroxysmal atrial fibrillation: Secondary | ICD-10-CM | POA: Diagnosis not present

## 2021-06-13 DIAGNOSIS — E7849 Other hyperlipidemia: Secondary | ICD-10-CM

## 2021-06-13 DIAGNOSIS — R4701 Aphasia: Secondary | ICD-10-CM | POA: Diagnosis not present

## 2021-06-13 DIAGNOSIS — R339 Retention of urine, unspecified: Secondary | ICD-10-CM

## 2021-06-13 DIAGNOSIS — C7931 Secondary malignant neoplasm of brain: Secondary | ICD-10-CM | POA: Diagnosis not present

## 2021-06-13 DIAGNOSIS — I82431 Acute embolism and thrombosis of right popliteal vein: Secondary | ICD-10-CM | POA: Diagnosis not present

## 2021-06-13 DIAGNOSIS — I10 Essential (primary) hypertension: Secondary | ICD-10-CM | POA: Diagnosis not present

## 2021-06-13 DIAGNOSIS — C434 Malignant melanoma of scalp and neck: Secondary | ICD-10-CM | POA: Diagnosis not present

## 2021-06-13 DIAGNOSIS — Z9229 Personal history of other drug therapy: Secondary | ICD-10-CM | POA: Diagnosis not present

## 2021-06-13 MED ORDER — TIMOLOL MALEATE 0.5 % OP SOLN: 1.0000 [drp] | Freq: Two times a day (BID) | OPHTHALMIC | 2 refills | Status: AC

## 2021-06-13 MED ORDER — ACYCLOVIR 400 MG PO TABS: 400.0000 mg | ORAL_TABLET | Freq: Two times a day (BID) | ORAL | 2 refills | Status: AC

## 2021-06-13 MED ORDER — TAMSULOSIN HCL 0.4 MG PO CAPS: 0.4000 mg | ORAL_CAPSULE | Freq: Every day | ORAL | 0 refills | Status: DC

## 2021-06-13 MED ORDER — PANTOPRAZOLE SODIUM 40 MG PO TBEC: 40.0000 mg | DELAYED_RELEASE_TABLET | Freq: Every day | ORAL | 2 refills | Status: AC

## 2021-06-13 MED ORDER — LEVETIRACETAM 750 MG PO TABS: 750.0000 mg | ORAL_TABLET | Freq: Two times a day (BID) | ORAL | 2 refills | Status: DC

## 2021-06-13 MED ORDER — ATORVASTATIN CALCIUM 10 MG PO TABS: 10.0000 mg | ORAL_TABLET | Freq: Every day | ORAL | 1 refills | Status: DC

## 2021-06-13 MED ORDER — RALOXIFENE HCL 60 MG PO TABS: 60.0000 mg | ORAL_TABLET | Freq: Every morning | ORAL | 2 refills | Status: DC

## 2021-06-13 MED ORDER — METOPROLOL SUCCINATE ER 25 MG PO TB24: 25.0000 mg | ORAL_TABLET | Freq: Two times a day (BID) | ORAL | 2 refills | Status: DC

## 2021-06-13 MED ORDER — ONDANSETRON HCL 4 MG PO TABS: 4.0000 mg | ORAL_TABLET | ORAL | 1 refills | Status: DC | PRN

## 2021-06-13 MED ORDER — BETHANECHOL CHLORIDE 50 MG PO TABS: 50.0000 mg | ORAL_TABLET | Freq: Three times a day (TID) | ORAL | 2 refills | Status: AC

## 2021-06-13 NOTE — Progress Notes (Signed)
Subjective:    Patient ID: CACY PHIN, female    DOB: [birth year], 80 y.o.   MRN: [id number]  HPI  Angela Carpenter is here in follow up of her rehab stay and malignant melanoma. HH therapies have just started.  She denies any pain currently.  She just had stereotactic radiotherapy yesterday which was the first of 5 treatments.  Chemotherapy is to begin next week.  She is still experiencing urine retention but urecholine remains beneficial for her. She still experiences some anxiety associated with voids. Her bowels are moving somewhat, typically every other day. Her blisters have resolved.   She is transferring with supervision and only light assistance. She uses a w/c in the house.  Family seems to be pretty proactive about keeping her moving.  She still experiences swelling in her legs and has been wearing knee-high TED hose.  The blisters that were on her feet and around her toes have nearly resolved per her son.  Son works at home and is with her most of the time.   Pain Inventory Average Pain 0 Pain Right Now 0 My pain is  none  LOCATION OF PAIN  none  BOWEL Number of stools per week: 15 Oral laxative use Yes  Type of laxative miralax  BLADDER Normal  Mobility walk with assistance use a walker how many minutes can you walk? 15 ability to climb steps?  no do you drive?  no use a wheelchair  Function retired I need assistance with the following:  bathing, meal prep, household duties, and shopping  Neuro/Psych weakness trouble walking  Prior [facility] f/u  Physicians involved in your care Hospital f/u   Family History  Problem Relation Age of Onset   Lung cancer Mother    Hypotension Mother    Pancreatic cancer Father    Hypertension Sister    Kidney cancer Brother    Cirrhosis Maternal Grandfather    Breast cancer Paternal Grandmother    Cirrhosis Paternal Grandfather    Breast cancer Paternal Aunt    Social History   Socioeconomic History    Marital status: Widowed    Spouse name: Not on file   Number of children: 1   Years of education: Not on file   Highest education level: Not on file  Occupational History   Occupation: retired  Tobacco Use   Smoking status: Never    Passive exposure: Yes   Smokeless tobacco: Never   Tobacco comments:    smoked in college but didn't inhale  Vaping Use   Vaping Use: Never used  Substance and Sexual Activity   Alcohol use: No    Alcohol/week: 0.0 standard drinks   Drug use: No   Sexual activity: Not Currently  Other Topics Concern   Not on file  Social History Narrative   Not on file   Social Determinants of Health   Financial Resource Strain: Not on file  Food Insecurity: Not on file  Transportation Needs: Not on file  Physical Activity: Not on file  Stress: Not on file  Social Connections: Not on file   Past Surgical History:  Procedure Laterality Date   APPLICATION OF CRANIAL NAVIGATION Left 05/01/2021   Procedure: [place];  Surgeon: Consuella Lose, MD;  Location: [place];  Service: Neurosurgery;  Laterality: Left;   AUGMENTATION MAMMAPLASTY Bilateral 2006   Patient had them removed in 2008   BREAST BIOPSY Left 2016   BREAST LUMPECTOMY Right 2006   COLONOSCOPY  had polyps   CRANIOTOMY Left 05/01/2021   Procedure: STEREOTACTIC FRONTAL CRANIOTOMY TUMOR EXCISION;  Surgeon: Consuella Lose, MD;  Location: [place];  Service: Neurosurgery;  Laterality: Left;   EYE SURGERY Right    cataract   IR IMAGING GUIDED PORT INSERTION  06/06/2021   IR US GUIDE VASC ACCESS LEFT  06/06/2021   MELANOMA EXCISION WITH SENTINEL LYMPH NODE BIOPSY N/A 06/02/2019   Procedure: Left lateral ANAL MELANOMA EXCISION WITH Bilateral Groin exploration and SENTINEL node mapping;  Surgeon: [name], MD;  Location: [place];  Service: General;  Laterality: N/A;   melanoma removal Left    crown   TONSILLECTOMY     Past Medical History:  Diagnosis Date   A-fib  ([place])    Atrial fibrillation ([place])    4 years ago started   Brain cancer ([place]) 12/2020   Breast cancer ([place]) 2006   Colon polyps    Glaucoma    Hypercholesteremia    Hypertension    patient denies    Osteoporosis    Personal history of chemotherapy 2006   Personal history of radiation therapy 2006   Stroke (cerebrum) ([place]) 2015   BP 113/73   Pulse 88   Temp 99 F (37.2 C)   Ht '5\' 5"'$  (1.651 m)   Wt 150 lb (68 kg)   SpO2 95%   BMI 24.96 kg/m   Opioid Risk Score:   Fall Risk Score:  `1  Depression screen PHQ 2/9  No flowsheet data found.  Review of Systems  Constitutional: Negative.   HENT: Negative.    Eyes: Negative.   Respiratory: Negative.    Cardiovascular: Negative.   Endocrine: Negative.   Genitourinary: Negative.   Musculoskeletal:  Positive for gait problem.  Skin: Negative.   Allergic/Immunologic: Negative.   Neurological:  Positive for weakness.  Hematological: Negative.   Psychiatric/Behavioral: Negative.    All other systems reviewed and are negative.     Objective:   Physical Exam Gen: no distress, normal appearing HEENT: oral mucosa pink and moist, NCAT Cardio: Reg rate Chest: normal effort, normal rate of breathing Abd: soft, non-distended Ext: LE 1+ edema, wearing TEDs Psych: pleasant, normal affect Skin: intact. Cath right chest wall.  Neuro: Alert and oriented x 3. Normal insight and awareness. Intact Memory. Normal language and speech. Cranial nerve exam unremarkable . RUE 3/5 prox to 2-3/5 distally. RLE 2/5 prox to 3/5 distally. Sensory 2/2. DTR's 1_ Musculoskeletal:  Full ROM, No pain with AROM or PROM in the neck, trunk, or extremities. Posture appropriate        Assessment & Plan:   1.  Right hemiplegia secondary to metastatic melanoma.  -Status post stereotactic radiosurgery September 6, #1 of 5 planned treatments  -Ipilimumab and nivolumab per oncology team to begin September 15  -Continue with [facility] therapies. Encouraged  activity as tolerated 2.  Age indeterminate DVT in the right popliteal vein.  Patient also with history of chronic DVT in the right common femoral vein.  On no anticoagulation at this time 3.  Urine retention:  -Patient was discharged home on Flomax and Urecholine  -continue on current doses for now given ongoing retention and upcoming treatments 4.  History of paroxysmal atrial fibrillation 5.  History of hyponatremia 6. Visual changes. Recommend ongoing observation for now. Consider ophtho visit if changes are persistent for exam, new rx's.  Wouldn't change rx at this time.  Refilled numerous meds to mail order [pharmacy   50 minutes of time was  spent with the patient in examination and review of her treatment plan and notes.  I will see her back in about 3 months time.

## 2021-06-13 NOTE — Patient Instructions (Signed)
PLEASE FEEL FREE TO CALL OUR OFFICE WITH ANY PROBLEMS OR QUESTIONS ([phone])      

## 2021-06-14 ENCOUNTER — Ambulatory Visit: Payer: Medicare Other

## 2021-06-14 ENCOUNTER — Ambulatory Visit
Admission: RE | Admit: 2021-06-14 | Discharge: 2021-06-14 | Disposition: A | Discharge status: Home / Self Care | Payer: Medicare Other | Source: Ambulatory Visit | Attending: Radiation Oncology | Admitting: Radiation Oncology

## 2021-06-14 ENCOUNTER — Other Ambulatory Visit: Payer: Self-pay

## 2021-06-14 DIAGNOSIS — C434 Malignant melanoma of scalp and neck: Secondary | ICD-10-CM | POA: Diagnosis not present

## 2021-06-14 DIAGNOSIS — R4701 Aphasia: Secondary | ICD-10-CM | POA: Diagnosis not present

## 2021-06-14 DIAGNOSIS — G8191 Hemiplegia, unspecified affecting right dominant side: Secondary | ICD-10-CM | POA: Diagnosis not present

## 2021-06-14 DIAGNOSIS — C7931 Secondary malignant neoplasm of brain: Secondary | ICD-10-CM | POA: Diagnosis not present

## 2021-06-14 DIAGNOSIS — I82431 Acute embolism and thrombosis of right popliteal vein: Secondary | ICD-10-CM | POA: Diagnosis not present

## 2021-06-14 DIAGNOSIS — I10 Essential (primary) hypertension: Secondary | ICD-10-CM | POA: Diagnosis not present

## 2021-06-14 NOTE — Progress Notes (Signed)
Patient rested with [place] for 15 minutes following his SRS treatment.  Patient denies headache, dizziness, nausea, diplopia or ringing in the ears. Denies fatigue. Patient without complaints. Understands to avoid strenuous activity for the next 24 hours and call [phone] with needs.  BP (!) 127/55 (BP Location: Left Arm, Patient Position: Sitting, Cuff Size: Normal)   Pulse 93   Temp (!) 97.4 F (36.3 C) (Temporal)   Resp 18   SpO2 95%

## 2021-06-15 ENCOUNTER — Ambulatory Visit: Payer: Medicare Other

## 2021-06-15 DIAGNOSIS — C7931 Secondary malignant neoplasm of brain: Secondary | ICD-10-CM | POA: Diagnosis not present

## 2021-06-15 DIAGNOSIS — R4701 Aphasia: Secondary | ICD-10-CM | POA: Diagnosis not present

## 2021-06-15 DIAGNOSIS — I10 Essential (primary) hypertension: Secondary | ICD-10-CM | POA: Diagnosis not present

## 2021-06-15 DIAGNOSIS — I82431 Acute embolism and thrombosis of right popliteal vein: Secondary | ICD-10-CM | POA: Diagnosis not present

## 2021-06-15 DIAGNOSIS — C434 Malignant melanoma of scalp and neck: Secondary | ICD-10-CM | POA: Diagnosis not present

## 2021-06-15 DIAGNOSIS — G8191 Hemiplegia, unspecified affecting right dominant side: Secondary | ICD-10-CM | POA: Diagnosis not present

## 2021-06-18 ENCOUNTER — Ambulatory Visit
Admission: RE | Admit: 2021-06-18 | Discharge: 2021-06-18 | Disposition: A | Discharge status: Home / Self Care | Payer: Medicare Other | Source: Ambulatory Visit | Attending: Radiation Oncology | Admitting: Radiation Oncology

## 2021-06-18 ENCOUNTER — Ambulatory Visit: Payer: Medicare Other

## 2021-06-18 ENCOUNTER — Other Ambulatory Visit: Payer: Self-pay

## 2021-06-18 VITALS — BP 130/62 | HR 92 | Temp 97.1°F | Resp 18

## 2021-06-18 DIAGNOSIS — C7931 Secondary malignant neoplasm of brain: Secondary | ICD-10-CM | POA: Diagnosis not present

## 2021-06-18 NOTE — Progress Notes (Signed)
Mrs. Angela Carpenter rested with [place] for 15 minutes following her [place] treatment.  Patient denies headache, dizziness, nausea, diplopia or ringing in the ears. Denies fatigue. Patient without complaints. Understands to avoid strenuous activity for the next 24 hours and call [phone] with needs.  Patient was wheeled out of the nursing clinic by her brother without difficulty.  BP 130/62   Pulse 92   Temp (!) 97.1 F (36.2 C) (Temporal)   Resp 18   SpO2 98%    Angela Carpenter M. Leonie Green, BSN

## 2021-06-19 ENCOUNTER — Telehealth: Payer: Self-pay | Admitting: Radiation Therapy

## 2021-06-19 ENCOUNTER — Other Ambulatory Visit (HOSPITAL_COMMUNITY): Payer: Medicare Other

## 2021-06-19 ENCOUNTER — Ambulatory Visit: Payer: Medicare Other

## 2021-06-19 DIAGNOSIS — I82431 Acute embolism and thrombosis of right popliteal vein: Secondary | ICD-10-CM | POA: Diagnosis not present

## 2021-06-19 DIAGNOSIS — R4701 Aphasia: Secondary | ICD-10-CM | POA: Diagnosis not present

## 2021-06-19 DIAGNOSIS — C434 Malignant melanoma of scalp and neck: Secondary | ICD-10-CM | POA: Diagnosis not present

## 2021-06-19 DIAGNOSIS — I10 Essential (primary) hypertension: Secondary | ICD-10-CM | POA: Diagnosis not present

## 2021-06-19 DIAGNOSIS — G8191 Hemiplegia, unspecified affecting right dominant side: Secondary | ICD-10-CM | POA: Diagnosis not present

## 2021-06-19 DIAGNOSIS — C7931 Secondary malignant neoplasm of brain: Secondary | ICD-10-CM | POA: Diagnosis not present

## 2021-06-19 NOTE — Telephone Encounter (Signed)
Returned call to Mr. Angela Carpenter in reference to his mother's scheduled treatment tomorrow. [name] was concerned that his mother has increased weakness in her Rt side and was asking if she needed to reschedule her SRT fraction on Wed 9/14. Dr. Lisbeth Renshaw requested that they come in for her scheduled treatment and allow [place] to evaluate her in person to assess her needs. [name] was happy for the call and agreed with this plan.   Mont Dutton R.T.(R)(T) Radiation Special Procedures Navigator

## 2021-06-20 ENCOUNTER — Ambulatory Visit
Admission: RE | Admit: 2021-06-20 | Discharge: 2021-06-20 | Disposition: A | Discharge status: Home / Self Care | Payer: Medicare Other | Source: Ambulatory Visit | Attending: Radiation Oncology | Admitting: Radiation Oncology

## 2021-06-20 ENCOUNTER — Ambulatory Visit: Payer: Medicare Other

## 2021-06-20 ENCOUNTER — Other Ambulatory Visit: Payer: Self-pay

## 2021-06-20 DIAGNOSIS — C7931 Secondary malignant neoplasm of brain: Secondary | ICD-10-CM | POA: Diagnosis not present

## 2021-06-20 NOTE — Progress Notes (Signed)
Mrs. Farewell rested with [place] for 15 minutes following her [place] treatment.  Patient denies headache, dizziness, nausea, diplopia or ringing in the ears. Denies fatigue. Patient without complaints. Understands to avoid strenuous activity for the next 24 hours and call [phone] with needs.  Patient was wheeled out of the nursing clinic by her brother without difficulty.  BP 127/71 (BP Location: Left Arm, Patient Position: Sitting, Cuff Size: Normal)   Pulse (!) 103   Temp 97.6 F (36.4 C)   Resp 20   SpO2 97%    Ladaisha Portillo M. Leonie Green, BSN

## 2021-06-21 ENCOUNTER — Ambulatory Visit: Payer: Medicare Other

## 2021-06-21 ENCOUNTER — Telehealth: Payer: Self-pay | Admitting: Oncology

## 2021-06-21 ENCOUNTER — Inpatient Hospital Stay: Payer: Medicare Other

## 2021-06-21 ENCOUNTER — Other Ambulatory Visit: Payer: Medicare Other

## 2021-06-21 ENCOUNTER — Inpatient Hospital Stay: Payer: Medicare Other | Attending: Oncology | Admitting: Oncology

## 2021-06-21 VITALS — BP 126/56 | HR 82 | Temp 98.1°F | Resp 17 | Ht 65.0 in | Wt 150.0 lb

## 2021-06-21 DIAGNOSIS — E039 Hypothyroidism, unspecified: Secondary | ICD-10-CM

## 2021-06-21 DIAGNOSIS — C211 Malignant neoplasm of anal canal: Secondary | ICD-10-CM | POA: Diagnosis not present

## 2021-06-21 DIAGNOSIS — Z5112 Encounter for antineoplastic immunotherapy: Secondary | ICD-10-CM | POA: Insufficient documentation

## 2021-06-21 DIAGNOSIS — R918 Other nonspecific abnormal finding of lung field: Secondary | ICD-10-CM | POA: Diagnosis not present

## 2021-06-21 DIAGNOSIS — C439 Malignant melanoma of skin, unspecified: Secondary | ICD-10-CM | POA: Insufficient documentation

## 2021-06-21 DIAGNOSIS — Z79899 Other long term (current) drug therapy: Secondary | ICD-10-CM | POA: Insufficient documentation

## 2021-06-21 DIAGNOSIS — Z95828 Presence of other vascular implants and grafts: Secondary | ICD-10-CM | POA: Insufficient documentation

## 2021-06-21 DIAGNOSIS — C7940 Secondary malignant neoplasm of unspecified part of nervous system: Secondary | ICD-10-CM | POA: Diagnosis not present

## 2021-06-21 LAB — CMP (CANCER CENTER ONLY)
ALT: 194 U/L — ABNORMAL HIGH (ref 0–44)
AST: 143 U/L — ABNORMAL HIGH (ref 15–41)
Albumin: 2.5 g/dL — ABNORMAL LOW (ref 3.5–5.0)
Alkaline Phosphatase: 485 U/L — ABNORMAL HIGH (ref 38–126)
Anion gap: 7 (ref 5–15)
BUN: 22 mg/dL (ref 8–23)
CO2: 27 mmol/L (ref 22–32)
Calcium: 8.9 mg/dL (ref 8.9–10.3)
Chloride: 106 mmol/L (ref 98–111)
Creatinine: 1.18 mg/dL — ABNORMAL HIGH (ref 0.44–1.00)
GFR, Estimated: 48 mL/min — ABNORMAL LOW (ref >60)
Glucose, Bld: 122 mg/dL — ABNORMAL HIGH (ref 70–99)
Potassium: 3 mmol/L — ABNORMAL LOW (ref 3.5–5.1)
Sodium: 140 mmol/L (ref 135–145)
Total Bilirubin: 2 mg/dL — ABNORMAL HIGH (ref 0.3–1.2)
Total Protein: 5.8 g/dL — ABNORMAL LOW (ref 6.5–8.1)

## 2021-06-21 LAB — CBC WITH DIFFERENTIAL (CANCER CENTER ONLY)
Abs Immature Granulocytes: 0.27 10*3/uL — ABNORMAL HIGH (ref 0.00–0.07)
Basophils Absolute: 0.1 10*3/uL (ref 0.0–0.1)
Basophils Relative: 1 %
Eosinophils Absolute: 0.6 10*3/uL — ABNORMAL HIGH (ref 0.0–0.5)
Eosinophils Relative: 4 %
HCT: 31.4 % — ABNORMAL LOW (ref 36.0–46.0)
Hemoglobin: 10.3 g/dL — ABNORMAL LOW (ref 12.0–15.0)
Immature Granulocytes: 2 %
Lymphocytes Relative: 6 %
Lymphs Abs: 0.9 10*3/uL (ref 0.7–4.0)
MCH: 32 pg (ref 26.0–34.0)
MCHC: 32.8 g/dL (ref 30.0–36.0)
MCV: 97.5 fL (ref 80.0–100.0)
Monocytes Absolute: 1.6 10*3/uL — ABNORMAL HIGH (ref 0.1–1.0)
Monocytes Relative: 11 %
Neutro Abs: 10.9 10*3/uL — ABNORMAL HIGH (ref 1.7–7.7)
Neutrophils Relative %: 76 %
Platelet Count: 412 10*3/uL — ABNORMAL HIGH (ref 150–400)
RBC: 3.22 MIL/uL — ABNORMAL LOW (ref 3.87–5.11)
RDW: 14 % (ref 11.5–15.5)
WBC Count: 14.4 10*3/uL — ABNORMAL HIGH (ref 4.0–10.5)
nRBC: 0 % (ref 0.0–0.2)

## 2021-06-21 LAB — TSH: TSH: 2.325 u[IU]/mL (ref 0.308–3.960)

## 2021-06-21 MED ORDER — SODIUM CHLORIDE 0.9 % IV SOLN: Freq: Once | INTRAVENOUS | Status: AC

## 2021-06-21 MED ORDER — DIPHENHYDRAMINE HCL 50 MG/ML IJ SOLN
25.0000 mg | Freq: Once | INTRAMUSCULAR | Status: AC
  Administered 2021-06-21: 25 mg via INTRAVENOUS
  Filled 2021-06-21: qty 1

## 2021-06-21 MED ORDER — SODIUM CHLORIDE 0.9 % IV SOLN
1.0000 mg/kg | Freq: Once | INTRAVENOUS | Status: AC
  Administered 2021-06-21: 70 mg via INTRAVENOUS
  Filled 2021-06-21: qty 14

## 2021-06-21 MED ORDER — HEPARIN SOD (PORK) LOCK FLUSH 100 UNIT/ML IV SOLN
500.0000 [IU] | Freq: Once | INTRAVENOUS | Status: AC | PRN
  Administered 2021-06-21: 500 [IU]

## 2021-06-21 MED ORDER — SODIUM CHLORIDE 0.9% FLUSH
10.0000 mL | Freq: Once | INTRAVENOUS | Status: AC
  Administered 2021-06-21: 10 mL

## 2021-06-21 MED ORDER — SODIUM CHLORIDE 0.9% FLUSH
10.0000 mL | INTRAVENOUS | Status: DC | PRN
  Administered 2021-06-21: 10 mL

## 2021-06-21 MED ORDER — FAMOTIDINE 20 MG IN NS 100 ML IVPB
20.0000 mg | Freq: Once | INTRAVENOUS | Status: AC
  Administered 2021-06-21: 20 mg via INTRAVENOUS
  Filled 2021-06-21: qty 100

## 2021-06-21 MED ORDER — SODIUM CHLORIDE 0.9 % IV SOLN
2.9400 mg/kg | Freq: Once | INTRAVENOUS | Status: AC
  Administered 2021-06-21: 200 mg via INTRAVENOUS
  Filled 2021-06-21: qty 20

## 2021-06-21 NOTE — Progress Notes (Signed)
Ok to treat with elevated AST, ALT, and total billi per Dr.Shadad.

## 2021-06-21 NOTE — Telephone Encounter (Signed)
Scheduled per sch msg. Called and left msg  

## 2021-06-21 NOTE — Progress Notes (Signed)
Hematology and Oncology Follow Up Visit  Angela Carpenter [id number] 07-19-1941 80 y.o. 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  She developed stage IV disease with pelvic involvement and CNS disease in March 2022.  Prior Therapy:   She is status post perianal excision in August 2020.  Stereotactic radiosurgery completed on December 28, 2020.  He received 18 [place] in 1 fraction.  Pembrolizumab 200 mg every 3 weeks started on July 29, 2019.  Last treatment given on November 08, 2020.  She is status post left frontal craniotomy and resection of her residual melanoma completed on May 01, 2021.  Pembrolizumab 400 mg every 6 weeks started on Feb 15, 2021.  Last treatment given on March 29, 2021.  Current therapy:   Stereotactic radiosurgery started on September 6.  She is scheduled for her fifth treatment on 06/22/2021.  Ipilimumab 1 mg/kg with nivolumab 3 mg/kg to start on 06/21/2021.   Interim History: Angela Carpenter returns today for repeat evaluation.  Since her last visit, she is completing stereotactic radiosurgery with her fifth treatment scheduled on 06/22/2021.  She has reports of fatigue tiredness and fluctuating weakness on the right side.  She reports recovery after each treatment however.  She continues to receive physical and occupational therapy at home and has improved her strength and mobility utilizing a walker occasional pains.  She denies any fevers or chills or sweats.  She denies any appetite changes.     Medications: Updated on review. Current Outpatient Medications  Medication Sig Dispense Refill   acyclovir (ZOVIRAX) 400 MG tablet Take 1 tablet (400 mg total) by mouth 2 (two) times daily. 180 tablet 2   atorvastatin (LIPITOR) 10 MG tablet Take 1 tablet (10 mg total) by mouth daily. 90 tablet 1   bethanechol (URECHOLINE) 50 MG tablet Take 1 tablet  (50 mg total) by mouth 3 (three) times daily. 180 tablet 2   camphor-menthol (SARNA) lotion Apply topically as needed for itching. 222 mL 0   Cholecalciferol (VITAMIN D-3) 125 MCG (5000 UT) TABS Take 5,000 Units by mouth daily. 30 tablet 3   levETIRAcetam (KEPPRA) 750 MG tablet Take 1 tablet (750 mg total) by mouth 2 (two) times daily. 180 tablet 2   lidocaine-prilocaine (EMLA) cream Apply 1 application topically as needed. 30 g 0   metoprolol succinate (TOPROL-XL) 25 MG 24 hr tablet Take 1 tablet (25 mg total) by mouth 2 (two) times daily. 180 tablet 2   Multiple Vitamins-Minerals (PRESERVISION AREDS 2+MULTI VIT) CAPS Take 1 capsule by mouth 2 (two) times daily.     nutrition supplement, JUVEN, (JUVEN) PACK Take 1 packet by mouth 2 (two) times daily between meals. 60 packet 0   ondansetron (ZOFRAN) 4 MG tablet Take 1 tablet (4 mg total) by mouth every 4 (four) hours as needed for nausea or vomiting. 90 tablet 1   pantoprazole (PROTONIX) 40 MG tablet Take 1 tablet (40 mg total) by mouth at bedtime. 90 tablet 2   polyethylene glycol (MIRALAX / GLYCOLAX) 17 g packet Take 17 g by mouth 2 (two) times daily. 60 each 0   raloxifene (EVISTA) 60 MG tablet Take 1 tablet (60 mg total) by mouth in the morning. 90 tablet 2   senna-docusate (SENOKOT-S) 8.6-50 MG tablet Take 1 tablet by mouth 2 (two) times daily. 60 tablet 0   tamsulosin (FLOMAX) 0.4 MG CAPS capsule Take 1  capsule (0.4 mg total) by mouth daily after supper. 30 capsule 0   timolol (TIMOPTIC) 0.5 % ophthalmic solution Place 1 drop into both eyes 2 (two) times daily. 30 mL 2   No current facility-administered medications for this visit.     Allergies: No Known Allergies     Physical Exam:      Blood pressure (!) 126/56, pulse 82, temperature 98.1 F (36.7 C), temperature source Oral, resp. rate 17, height '5\' 5"'$  (1.651 m), weight 150 lb (68 kg), SpO2 99 %.    ECOG: 1    General appearance: Comfortable appearing without any  discomfort Head: Normocephalic without any trauma Oropharynx: Mucous membranes are moist and pink without any thrush or ulcers. Eyes: Pupils are equal and round reactive to light. Lymph nodes: No cervical, supraclavicular, inguinal or axillary lymphadenopathy.   Heart:regular rate and rhythm.  S1 and S2 without leg edema. Lung: Clear without any rhonchi or wheezes.  No dullness to percussion. Abdomin: Soft, nontender, nondistended with good bowel sounds.  No hepatosplenomegaly. Musculoskeletal: No joint deformity or effusion.  Full range of motion noted. Neurological: Weakness noted in the right side upper and lower extremities. Skin: No petechial rash or dryness.  Appeared moist.                                 Lab Results: Lab Results  Component Value Date   WBC 14.8 (H) 05/28/2021   HGB 11.8 (L) 05/28/2021   HCT 36.5 05/28/2021   MCV 98.6 05/28/2021   PLT 318 05/28/2021     Chemistry      Component Value Date/Time   NA 137 05/28/2021 0636   NA 138 12/05/2016 0750   K 3.7 05/28/2021 0636   CL 105 05/28/2021 0636   CO2 25 05/28/2021 0636   BUN 18 05/28/2021 0636   BUN 15 12/05/2016 0750   CREATININE 0.56 05/28/2021 0636   CREATININE 0.68 03/29/2021 1130   CREATININE 0.70 09/05/2015 0828      Component Value Date/Time   CALCIUM 8.7 (L) 05/28/2021 0636   ALKPHOS 50 05/04/2021 0514   AST 21 05/04/2021 0514   AST 19 03/29/2021 1130   ALT 55 (H) 05/04/2021 0514   ALT 35 03/29/2021 1130   BILITOT 0.4 05/04/2021 0514   BILITOT 0.5 03/29/2021 1130        Impression and Plan:  80 year old with:   1.   Mucosal melanoma of anal canal diagnosed in 2020.  She developed stage IV disease with CNS involvement.   She is currently under evaluation to start combination immunotherapy with ipilimumab and nivolumab.  I continue to reiterate the rationale and the complications associated with this treatment.  Autoimmune issues, GI toxicity as well as  fatigue were reviewed.  Plan is to update her staging scans after 4 cycles of treatment.   2.  Immune mediated complications: These were reviewed again including hepatitis, otitis, colitis, thyroid disease.   3.  IV access: Port-A-Cath inserted without any complications at this time.   4.  Antiemetics: No nausea or vomiting reported at this time Compazine is available to her.  5.  CNS metastasis: She has completed surgical resection followed by stereotactic radiosurgery.  She still has some mild right-sided weakness with slow recovery.  6.  Pulmonary nodules: He is will be followed on subsequent scans.  Could be evidence of metastatic disease  7.   Follow-up: In 3 weeks for  the next cycle of therapy.  30 minutes were spent on this encounter.  Time was dedicated to reviewing clinical status, treatment options, addressing complications related to her cancer and cancer treatment.   Zola Button, MD 9/15/202211:52 AM

## 2021-06-21 NOTE — Patient Instructions (Signed)
[facility]  Discharge Instructions: Thank you for choosing [facility] to provide your oncology and hematology care.   If you have a lab appointment with the [facility], please go directly to the [facility] and check in at the registration area.   Wear comfortable clothing and clothing appropriate for easy access to any Portacath or PICC line.   We strive to give you quality time with your provider. You may need to reschedule your appointment if you arrive late (15 or more minutes).  Arriving late affects you and other patients whose appointments are after yours.  Also, if you miss three or more appointments without notifying the office, you may be dismissed from the clinic at the provider's discretion.      For prescription refill requests, have your pharmacy contact our office and allow 72 hours for refills to be completed.    Today you received the following chemotherapy and/or immunotherapy agents opdivo, yervoy       To help prevent nausea and vomiting after your treatment, we encourage you to take your nausea medication as directed.  BELOW ARE SYMPTOMS THAT SHOULD BE REPORTED IMMEDIATELY: *FEVER GREATER THAN 100.4 F (38 C) OR HIGHER *CHILLS OR SWEATING *NAUSEA AND VOMITING THAT IS NOT CONTROLLED WITH YOUR NAUSEA MEDICATION *UNUSUAL SHORTNESS OF BREATH *UNUSUAL BRUISING OR BLEEDING *URINARY PROBLEMS (pain or burning when urinating, or frequent urination) *BOWEL PROBLEMS (unusual diarrhea, constipation, pain near the anus) TENDERNESS IN MOUTH AND THROAT WITH OR WITHOUT PRESENCE OF ULCERS (sore throat, sores in mouth, or a toothache) UNUSUAL RASH, SWELLING OR PAIN  UNUSUAL VAGINAL DISCHARGE OR ITCHING   Items with * indicate a potential emergency and should be followed up as soon as possible or go to the Emergency Department if any problems should occur.  Please show the CHEMOTHERAPY ALERT CARD or IMMUNOTHERAPY ALERT CARD at  check-in to the Emergency Department and triage nurse.  Should you have questions after your visit or need to cancel or reschedule your appointment, please contact [facility]  Dept: [phone]  and follow the prompts.  Office hours are 8:00 a.m. to 4:30 p.m. Monday - Friday. Please note that voicemails left after 4:00 p.m. may not be returned until the following business day.  We are closed weekends and major holidays. You have access to a nurse at all times for urgent questions. Please call the main number to the clinic Dept: [phone] and follow the prompts.   For any non-urgent questions, you may also contact your provider using MyChart. We now offer e-Visits for anyone 67 and older to request care online for non-urgent symptoms. For details visit [email or web address].   Also download the MyChart app! Go to the app store, search "MyChart", open the app, select [facility], and log in with your MyChart username and password.  Due to Covid, a mask is required upon entering the hospital/clinic. If you do not have a mask, one will be given to you upon arrival. For doctor visits, patients may have 1 support person aged 57 or older with them. For treatment visits, patients cannot have anyone with them due to current Covid guidelines and our immunocompromised population.   Nivolumab injection What is this medication? NIVOLUMAB (nye VOL ue mab) is a monoclonal antibody. It treats certain types of cancer. Some of the cancers treated are colon cancer, head and neck cancer, Hodgkin lymphoma, lung cancer, and melanoma. This medicine may be used for other purposes;  ask your health care provider or pharmacist if you have questions. COMMON BRAND NAME(S): Opdivo What should I tell my care team before I take this medication? They need to know if you have any of these conditions: autoimmune diseases like Crohn's disease, ulcerative colitis, or lupus have had or planning to  have an allogeneic stem cell transplant (uses someone else's stem cells) history of chest radiation history of organ transplant nervous system problems like myasthenia gravis or Guillain-Barre syndrome an unusual or allergic reaction to nivolumab, other medicines, foods, dyes, or preservatives pregnant or trying to get pregnant breast-feeding How should I use this medication? This medicine is for infusion into a vein. It is given by a health care professional in a hospital or clinic setting. A special MedGuide will be given to you before each treatment. Be sure to read this information carefully each time. Talk to your pediatrician regarding the use of this medicine in children. While this drug may be prescribed for children as young as 12 years for selected conditions, precautions do apply. Overdosage: If you think you have taken too much of this medicine contact a poison control center or emergency room at once. NOTE: This medicine is only for you. Do not share this medicine with others. What if I miss a dose? It is important not to miss your dose. Call your doctor or health care professional if you are unable to keep an appointment. What may interact with this medication? Interactions have not been studied. This list may not describe all possible interactions. Give your health care provider a list of all the medicines, herbs, non-prescription drugs, or dietary supplements you use. Also tell them if you smoke, drink alcohol, or use illegal drugs. Some items may interact with your medicine. What should I watch for while using this medication? This drug may make you feel generally unwell. Continue your course of treatment even though you feel ill unless your doctor tells you to stop. You may need blood work done while you are taking this medicine. Do not become pregnant while taking this medicine or for 5 months after stopping it. Women should inform their doctor if they wish to become pregnant  or think they might be pregnant. There is a potential for serious side effects to an unborn child. Talk to your health care professional or pharmacist for more information. Do not breast-feed an infant while taking this medicine or for 5 months after stopping it. What side effects may I notice from receiving this medication? Side effects that you should report to your doctor or health care professional as soon as possible: allergic reactions like skin rash, itching or hives, swelling of the face, lips, or tongue breathing problems blood in the urine bloody or watery diarrhea or black, tarry stools changes in emotions or moods changes in vision chest pain cough dizziness feeling faint or lightheaded, falls fever, chills headache with fever, neck stiffness, confusion, loss of memory, sensitivity to light, hallucination, loss of contact with reality, or seizures joint pain mouth sores redness, blistering, peeling or loosening of the skin, including inside the mouth severe muscle pain or weakness signs and symptoms of high blood sugar such as dizziness; dry mouth; dry skin; fruity breath; nausea; stomach pain; increased hunger or thirst; increased urination signs and symptoms of kidney injury like trouble passing urine or change in the amount of urine signs and symptoms of liver injury like dark yellow or brown urine; general ill feeling or flu-like symptoms; light-colored stools; loss of  appetite; nausea; right upper belly pain; unusually weak or tired; yellowing of the eyes or skin swelling of the ankles, feet, hands trouble passing urine or change in the amount of urine unusually weak or tired weight gain or loss Side effects that usually do not require medical attention (report to your doctor or health care professional if they continue or are bothersome): bone pain constipation decreased appetite diarrhea muscle pain nausea, vomiting tiredness This list may not describe all  possible side effects. Call your doctor for medical advice about side effects. You may report side effects to FDA at 1-800-FDA-1088. Where should I keep my medication? This drug is given in a hospital or clinic and will not be stored at home. NOTE: This sheet is a summary. It may not cover all possible information. If you have questions about this medicine, talk to your doctor, pharmacist, or health care provider.  2022 Elsevier/Gold Standard (2020-01-26 10:08:25)  Ipilimumab injection What is this medication? IPILIMUMAB (IP i LIM ue mab) is a monoclonal antibody. It is used to treat colorectal cancer, kidney cancer, liver cancer, lung cancer, melanoma, and mesothelioma. This medicine may be used for other purposes; ask your health care provider or pharmacist if you have questions. COMMON BRAND NAME(S): YERVOY What should I tell my care team before I take this medication? They need to know if you have any of these conditions: autoimmune diseases like Crohn's disease, ulcerative colitis, or lupus have had or planning to have an allogeneic stem cell transplant (uses someone else's stem cells) history of organ transplant nervous system problems like myasthenia gravis or Guillain-Barre syndrome an unusual or allergic reaction to ipilimumab, other medicines, foods, dyes, or preservatives pregnant or trying to get pregnant breast-feeding How should I use this medication? This medicine is for infusion into a vein. It is given by a health care professional in a hospital or clinic setting. A special MedGuide will be given to you before each treatment. Be sure to read this information carefully each time. Talk to your pediatrician regarding the use of this medicine in children. While this drug may be prescribed for children as young as 12 years for selected conditions, precautions do apply. Overdosage: If you think you have taken too much of this medicine contact a poison control center or emergency  room at once. NOTE: This medicine is only for you. Do not share this medicine with others. What if I miss a dose? It is important not to miss your dose. Call your doctor or health care professional if you are unable to keep an appointment. What may interact with this medication? Interactions are not expected. This list may not describe all possible interactions. Give your health care provider a list of all the medicines, herbs, non-prescription drugs, or dietary supplements you use. Also tell them if you smoke, drink alcohol, or use illegal drugs. Some items may interact with your medicine. What should I watch for while using this medication? Tell your doctor or healthcare professional if your symptoms do not start to get better or if they get worse. Do not become pregnant while taking this medicine or for 3 months after stopping it. Women should inform their doctor if they wish to become pregnant or think they might be pregnant. There is a potential for serious side effects to an unborn child. Talk to your health care professional or pharmacist for more information. Do not breast-feed an infant while taking this medicine or for 3 months after the last dose. Your condition  will be monitored carefully while you are receiving this medicine. You may need blood work done while you are taking this medicine. What side effects may I notice from receiving this medication? Side effects that you should report to your doctor or health care professional as soon as possible: allergic reactions like skin rash, itching or hives, swelling of the face, lips, or tongue black, tarry stools bloody or watery diarrhea changes in vision dizziness eye pain fast, irregular heartbeat feeling anxious feeling faint or lightheaded, falls nausea, vomiting pain, tingling, numbness in the hands or feet redness, blistering, peeling or loosening of the skin, including inside the mouth signs and symptoms of liver injury like  dark yellow or brown urine; general ill feeling or flu-like symptoms; light-colored stools; loss of appetite; nausea; right upper belly pain; unusually weak or tired; yellowing of the eyes or skin unusual bleeding or bruising Side effects that usually do not require medical attention (report to your doctor or health care professional if they continue or are bothersome): headache loss of appetite trouble sleeping This list may not describe all possible side effects. Call your doctor for medical advice about side effects. You may report side effects to FDA at 1-800-FDA-1088. Where should I keep my medication? This drug is given in a hospital or clinic and will not be stored at home. NOTE: This sheet is a summary. It may not cover all possible information. If you have questions about this medicine, talk to your doctor, pharmacist, or health care provider.  2022 Elsevier/Gold Standard (2019-08-25 18:53:00)

## 2021-06-21 NOTE — Progress Notes (Signed)
Ipilimumab (YERVOY) Patient Monitoring Assessment   Is the patient experiencing any of the following general symptoms?:  '[x]'$ Difficulty performing normal activities '[]'$ Feeling sluggish or cold all the time '[]'$ Unusual weight gain '[]'$ Constant or unusual headaches '[]'$ Feeling dizzy or faint '[x]'$ Changes in eyesight (blurry vision, double vision, or other vision problems) '[]'$ Changes in mood or behavior (ex: decreased sex drive, irritability, or forgetfulness) '[]'$ Starting new medications (ex: steroids, other medications that lower immune response) '[]'$ Patient is not experiencing any of the general symptoms above.    Gastrointestinal  Patient is having 1 bowel movements each day.  Is this different from baseline? '[]'$ Yes '[x]'$ No Are your stools watery or do they have a foul smell? '[]'$ Yes '[x]'$ No Have you seen blood in your stools? '[]'$ Yes '[x]'$ No Are your stools dark, tarry, or sticky? '[]'$ Yes '[x]'$ No Are you having pain or tenderness in your belly? '[]'$ Yes '[x]'$ No  Skin Does your skin itch? '[]'$ Yes '[x]'$ No Do you have a rash? '[]'$ Yes '[x]'$ No Has your skin blistered and/or peeled? '[]'$ Yes '[x]'$ No Do you have sores in your mouth? '[]'$ Yes '[x]'$ No  Hepatic Has your urine been dark or tea colored? '[]'$ Yes '[x]'$ No Have you noticed that your skin or the whites of your eyes are turning yellow? '[]'$ Yes '[x]'$ No Are you bleeding or bruising more easily than normal? '[]'$ Yes '[x]'$ No Are you nauseous and/or vomiting? '[]'$ Yes '[x]'$ No Do you have pain on the right side of your stomach? '[]'$ Yes '[x]'$ No  Neurologic  Are you having unusual weakness of legs, arms, or face? '[]'$ Yes '[x]'$ No Are you having numbness or tingling in your hands or feet? '[x]'$ Yes '[]'$ No  MD aware of answers, most are from recent brain surgery and resulting side effects.   [place]

## 2021-06-22 ENCOUNTER — Ambulatory Visit
Admission: RE | Admit: 2021-06-22 | Discharge: 2021-06-22 | Disposition: A | Discharge status: Home / Self Care | Payer: Medicare Other | Source: Ambulatory Visit | Attending: Radiation Oncology | Admitting: Radiation Oncology

## 2021-06-22 ENCOUNTER — Other Ambulatory Visit: Payer: Self-pay | Admitting: *Deleted

## 2021-06-22 ENCOUNTER — Encounter: Payer: Self-pay | Admitting: Radiation Oncology

## 2021-06-22 ENCOUNTER — Other Ambulatory Visit: Payer: Self-pay

## 2021-06-22 ENCOUNTER — Other Ambulatory Visit: Payer: Medicare Other

## 2021-06-22 DIAGNOSIS — C211 Malignant neoplasm of anal canal: Secondary | ICD-10-CM | POA: Diagnosis not present

## 2021-06-22 DIAGNOSIS — C7931 Secondary malignant neoplasm of brain: Secondary | ICD-10-CM | POA: Diagnosis not present

## 2021-06-22 NOTE — Progress Notes (Signed)
Mrs. Wallington rested with [place] for 15 minutes following her [place] treatment.  Patient denies headache, dizziness, nausea, diplopia or ringing in the ears. Denies fatigue. Patient without complaints. Understands to avoid strenuous activity for the next 24 hours and call [phone] with needs.  Patient was wheeled out of the nursing clinic by her brother without difficulty.  BP (!) 116/51   Pulse 84   Temp 97.6 F (36.4 C) (Temporal)   Resp 16   SpO2 99%    Nan Maya M. Leonie Green, BSN

## 2021-06-25 ENCOUNTER — Encounter: Payer: Self-pay | Admitting: Oncology

## 2021-06-25 NOTE — Progress Notes (Signed)
                                                                                                                                                             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   Plan: The patient will receive a call in about one month from the radiation oncology department. She will continue follow up with Dr.Vaslow and Dr. SAlen Blewas well.   ________________________________________________    [facility]arola Rhine [facility]

## 2021-06-26 ENCOUNTER — Telehealth: Payer: Self-pay

## 2021-06-26 ENCOUNTER — Ambulatory Visit: Payer: Medicare Other | Admitting: Internal Medicine

## 2021-06-26 ENCOUNTER — Other Ambulatory Visit: Payer: Self-pay | Admitting: *Deleted

## 2021-06-26 MED ORDER — DEXAMETHASONE 2 MG PO TABS: 2.0000 mg | ORAL_TABLET | ORAL | 0 refills | Status: DC

## 2021-06-26 NOTE — Progress Notes (Signed)
Per Dr Mickeal Skinner he recommended a brief course of higher dose (4mg  BID X 3 days) then bringing it down to 2 mg daily.

## 2021-06-26 NOTE — Telephone Encounter (Signed)
I spoke to the patient's son following up with her SRS treatments (completed 50fx on 9/16). He expressed that she was extremely weak and tired over the weekend. She still needs assistance to the bathroom and her right leg is barely functioning (previous weakness in that leg during treatment). Some improvement today, but not much. The son stressed that he is the sole caretaker and won't be able to care for her in this state for an extended period of time. He also mentioned that Dr. Alen Blew wanted to stay away from steroids due to her infusions, but is willing to do whatever the doctor advises. The patient had an infusion on 9/15 and the son was not sure if the side effects were caused by the radiation and/or infusion. I advised the patient's son to call if symptoms worsened. I notified the doctor and PA.  Angela Carpenter R.T.(T)

## 2021-06-27 ENCOUNTER — Inpatient Hospital Stay: Payer: Medicare Other

## 2021-06-27 DIAGNOSIS — I10 Essential (primary) hypertension: Secondary | ICD-10-CM | POA: Diagnosis not present

## 2021-06-27 DIAGNOSIS — G8191 Hemiplegia, unspecified affecting right dominant side: Secondary | ICD-10-CM | POA: Diagnosis not present

## 2021-06-27 DIAGNOSIS — C434 Malignant melanoma of scalp and neck: Secondary | ICD-10-CM | POA: Diagnosis not present

## 2021-06-27 DIAGNOSIS — R4701 Aphasia: Secondary | ICD-10-CM | POA: Diagnosis not present

## 2021-06-27 DIAGNOSIS — C7931 Secondary malignant neoplasm of brain: Secondary | ICD-10-CM | POA: Diagnosis not present

## 2021-06-27 DIAGNOSIS — I82431 Acute embolism and thrombosis of right popliteal vein: Secondary | ICD-10-CM | POA: Diagnosis not present

## 2021-06-28 DIAGNOSIS — C7931 Secondary malignant neoplasm of brain: Secondary | ICD-10-CM | POA: Diagnosis not present

## 2021-06-28 DIAGNOSIS — I82431 Acute embolism and thrombosis of right popliteal vein: Secondary | ICD-10-CM | POA: Diagnosis not present

## 2021-06-28 DIAGNOSIS — C434 Malignant melanoma of scalp and neck: Secondary | ICD-10-CM | POA: Diagnosis not present

## 2021-06-28 DIAGNOSIS — G8191 Hemiplegia, unspecified affecting right dominant side: Secondary | ICD-10-CM | POA: Diagnosis not present

## 2021-06-28 DIAGNOSIS — I10 Essential (primary) hypertension: Secondary | ICD-10-CM | POA: Diagnosis not present

## 2021-06-28 DIAGNOSIS — R4701 Aphasia: Secondary | ICD-10-CM | POA: Diagnosis not present

## 2021-06-29 ENCOUNTER — Telehealth: Payer: Self-pay | Admitting: Oncology

## 2021-06-29 DIAGNOSIS — C434 Malignant melanoma of scalp and neck: Secondary | ICD-10-CM | POA: Diagnosis not present

## 2021-06-29 DIAGNOSIS — R4701 Aphasia: Secondary | ICD-10-CM | POA: Diagnosis not present

## 2021-06-29 DIAGNOSIS — I82431 Acute embolism and thrombosis of right popliteal vein: Secondary | ICD-10-CM | POA: Diagnosis not present

## 2021-06-29 DIAGNOSIS — C7931 Secondary malignant neoplasm of brain: Secondary | ICD-10-CM | POA: Diagnosis not present

## 2021-06-29 DIAGNOSIS — I10 Essential (primary) hypertension: Secondary | ICD-10-CM | POA: Diagnosis not present

## 2021-06-29 DIAGNOSIS — G8191 Hemiplegia, unspecified affecting right dominant side: Secondary | ICD-10-CM | POA: Diagnosis not present

## 2021-06-29 NOTE — Telephone Encounter (Signed)
Scheduled per 09/15 los, patient has been called and voicemail was left.

## 2021-06-30 DIAGNOSIS — M810 Age-related osteoporosis without current pathological fracture: Secondary | ICD-10-CM | POA: Diagnosis not present

## 2021-06-30 DIAGNOSIS — Z9181 History of falling: Secondary | ICD-10-CM | POA: Diagnosis not present

## 2021-06-30 DIAGNOSIS — I480 Paroxysmal atrial fibrillation: Secondary | ICD-10-CM | POA: Diagnosis not present

## 2021-06-30 DIAGNOSIS — G40909 Epilepsy, unspecified, not intractable, without status epilepticus: Secondary | ICD-10-CM | POA: Diagnosis not present

## 2021-06-30 DIAGNOSIS — C434 Malignant melanoma of scalp and neck: Secondary | ICD-10-CM | POA: Diagnosis not present

## 2021-06-30 DIAGNOSIS — H409 Unspecified glaucoma: Secondary | ICD-10-CM | POA: Diagnosis not present

## 2021-06-30 DIAGNOSIS — I10 Essential (primary) hypertension: Secondary | ICD-10-CM | POA: Diagnosis not present

## 2021-06-30 DIAGNOSIS — G8191 Hemiplegia, unspecified affecting right dominant side: Secondary | ICD-10-CM | POA: Diagnosis not present

## 2021-06-30 DIAGNOSIS — E7800 Pure hypercholesterolemia, unspecified: Secondary | ICD-10-CM | POA: Diagnosis not present

## 2021-06-30 DIAGNOSIS — C7931 Secondary malignant neoplasm of brain: Secondary | ICD-10-CM | POA: Diagnosis not present

## 2021-06-30 DIAGNOSIS — R4701 Aphasia: Secondary | ICD-10-CM | POA: Diagnosis not present

## 2021-06-30 DIAGNOSIS — Z87891 Personal history of nicotine dependence: Secondary | ICD-10-CM | POA: Diagnosis not present

## 2021-06-30 DIAGNOSIS — I82431 Acute embolism and thrombosis of right popliteal vein: Secondary | ICD-10-CM | POA: Diagnosis not present

## 2021-06-30 DIAGNOSIS — Z8673 Personal history of transient ischemic attack (TIA), and cerebral infarction without residual deficits: Secondary | ICD-10-CM | POA: Diagnosis not present

## 2021-06-30 DIAGNOSIS — R7303 Prediabetes: Secondary | ICD-10-CM | POA: Diagnosis not present

## 2021-06-30 DIAGNOSIS — Z87440 Personal history of urinary (tract) infections: Secondary | ICD-10-CM | POA: Diagnosis not present

## 2021-07-02 DIAGNOSIS — C7931 Secondary malignant neoplasm of brain: Secondary | ICD-10-CM | POA: Diagnosis not present

## 2021-07-02 DIAGNOSIS — C434 Malignant melanoma of scalp and neck: Secondary | ICD-10-CM | POA: Diagnosis not present

## 2021-07-02 DIAGNOSIS — I10 Essential (primary) hypertension: Secondary | ICD-10-CM | POA: Diagnosis not present

## 2021-07-02 DIAGNOSIS — G8191 Hemiplegia, unspecified affecting right dominant side: Secondary | ICD-10-CM | POA: Diagnosis not present

## 2021-07-02 DIAGNOSIS — I82431 Acute embolism and thrombosis of right popliteal vein: Secondary | ICD-10-CM | POA: Diagnosis not present

## 2021-07-02 DIAGNOSIS — R4701 Aphasia: Secondary | ICD-10-CM | POA: Diagnosis not present

## 2021-07-03 ENCOUNTER — Other Ambulatory Visit: Payer: Self-pay

## 2021-07-03 ENCOUNTER — Inpatient Hospital Stay (HOSPITAL_BASED_OUTPATIENT_CLINIC_OR_DEPARTMENT_OTHER): Payer: Medicare Other | Admitting: Internal Medicine

## 2021-07-03 VITALS — BP 146/70 | HR 87 | Temp 97.8°F | Resp 18 | Ht 65.0 in | Wt 150.0 lb

## 2021-07-03 DIAGNOSIS — C7940 Secondary malignant neoplasm of unspecified part of nervous system: Secondary | ICD-10-CM | POA: Diagnosis not present

## 2021-07-03 DIAGNOSIS — G8191 Hemiplegia, unspecified affecting right dominant side: Secondary | ICD-10-CM | POA: Diagnosis not present

## 2021-07-03 DIAGNOSIS — G40109 Localization-related (focal) (partial) symptomatic epilepsy and epileptic syndromes with simple partial seizures, not intractable, without status epilepticus: Secondary | ICD-10-CM | POA: Diagnosis not present

## 2021-07-03 DIAGNOSIS — C7931 Secondary malignant neoplasm of brain: Secondary | ICD-10-CM

## 2021-07-03 DIAGNOSIS — C439 Malignant melanoma of skin, unspecified: Secondary | ICD-10-CM | POA: Diagnosis not present

## 2021-07-03 DIAGNOSIS — R918 Other nonspecific abnormal finding of lung field: Secondary | ICD-10-CM | POA: Diagnosis not present

## 2021-07-03 DIAGNOSIS — Z5112 Encounter for antineoplastic immunotherapy: Secondary | ICD-10-CM | POA: Diagnosis not present

## 2021-07-03 DIAGNOSIS — Z79899 Other long term (current) drug therapy: Secondary | ICD-10-CM | POA: Diagnosis not present

## 2021-07-03 NOTE — Progress Notes (Signed)
[facility] [place], [place] [address] [phone]   Interval Evaluation  Date of Service: 07/03/21 Patient Name: Angela Carpenter Patient MRN: [id number] Patient DOB: 1941/07/24 Provider: [name], MD  Identifying Statement:  Angela Carpenter is a 80 y.o. female with No primary diagnosis found.   Primary Cancer:  Oncologic History: Oncology History  Malignant melanoma of anal canal ([place])  07/21/2019 Initial Diagnosis   Malignant melanoma of anal canal ([place])   07/29/2019 - 03/29/2021 Chemotherapy          06/21/2021 -  Chemotherapy      Patient is on Antibody Plan: RENAL CELL CARCINOMA NIVOLUMAB + IPILIMUMAB Q21D / NIVOLUMAB Q14D     06/21/2021 Cancer Staging   Staging form: Melanoma of the Skin, AJCC 8th Edition - Clinical: Stage IV (cTX, cNX, pM1) - Signed by [name], MD on 06/21/2021    CNS Oncologic History 12/28/20: SRS to L frontal metastasis 05/01/21: Localized progression, craniotomy and resection by Dr. Kathyrn Sheriff; path c/w melanoma 06/20/21: Completes fractionated SRS Angela Carpenter)  Interval History:  Angela Carpenter presents today for clinical follow up.  She describes improvement in lethargy and right sided weakness since starting the decadron 36m daily on 9/20.  Several days ago she decreased to 253mdaily as instructed, and initially she had "bad day", though now improving.  Still far from her baseline pre-op.  At home she uses a walker all the time, and has little meaningful use of her right arm and hand.  Just started first cycle of ipi/nivo on 9/15.  No seizure or headaches recently.    Decadron 01/04/21: 77m92mtaper 01/24/21: Dc'd 03/07/21: 39m51m, then 77mg 61mly 03/27/21: 3mg, 57mer  06/26/21: 77mg 0917m/22: 2mg, ta76m  H+P (12/15/20) Patient presented to medical attention last week with new onset seizure, described as "right arm shaking, followed by loss of consciousness and whole body  shaking".  CNS imaging demonstrated an enhancing and hemorrhagic left frontal mass, c/w melanoma met.  She was started on Keppra and discharged to home after return to baseline.  At this time, she is concerned about her Eliquis, which was stopped while in the hospital.  She has no other complaints today, no recurrence of seizures since discharge.  Planning radiosurgery for the metastasis.  Medications: Current Outpatient Medications on File Prior to Visit  Medication Sig Dispense Refill   acyclovir (ZOVIRAX) 400 MG tablet Take 1 tablet (400 mg total) by mouth 2 (two) times daily. 180 tablet 2   atorvastatin (LIPITOR) 10 MG tablet Take 1 tablet (10 mg total) by mouth daily. 90 tablet 1   bethanechol (URECHOLINE) 50 MG tablet Take 1 tablet (50 mg total) by mouth 3 (three) times daily. 180 tablet 2   camphor-menthol (SARNA) lotion Apply topically as needed for itching. 222 mL 0   Cholecalciferol (VITAMIN D-3) 125 MCG (5000 UT) TABS Take 5,000 Units by mouth daily. 30 tablet 3   dexamethasone (DECADRON) 2 MG tablet Take 1 tablet (2 mg total) by mouth See admin instructions. Take two tablets (4 mg total) in morning and evening for 3 days and then take one tablet (2 mg total) daily thereafter. 30 tablet 0   levETIRAcetam (KEPPRA) 750 MG tablet Take 1 tablet (750 mg total) by mouth 2 (two) times daily. 180 tablet 2   lidocaine-prilocaine (EMLA) cream Apply 1 application topically as needed. 30 g 0   metoprolol succinate (TOPROL-XL) 25 MG 24  hr tablet Take 1 tablet (25 mg total) by mouth 2 (two) times daily. 180 tablet 2   Multiple Vitamins-Minerals (PRESERVISION AREDS 2+MULTI VIT) CAPS Take 1 capsule by mouth 2 (two) times daily.     ondansetron (ZOFRAN) 4 MG tablet Take 1 tablet (4 mg total) by mouth every 4 (four) hours as needed for nausea or vomiting. 90 tablet 1   pantoprazole (PROTONIX) 40 MG tablet Take 1 tablet (40 mg total) by mouth at bedtime. 90 tablet 2   raloxifene (EVISTA) 60 MG tablet Take 1  tablet (60 mg total) by mouth in the morning. 90 tablet 2   senna-docusate (SENOKOT-S) 8.6-50 MG tablet Take 1 tablet by mouth 2 (two) times daily. 60 tablet 0   tamsulosin (FLOMAX) 0.4 MG CAPS capsule Take 1 capsule (0.4 mg total) by mouth daily after supper. 30 capsule 0   timolol (TIMOPTIC) 0.5 % ophthalmic solution Place 1 drop into both eyes 2 (two) times daily. 30 mL 2   nutrition supplement, JUVEN, (JUVEN) PACK Take 1 packet by mouth 2 (two) times daily between meals. (Patient not taking: Reported on 07/03/2021) 60 packet 0   polyethylene glycol (MIRALAX / GLYCOLAX) 17 g packet Take 17 g by mouth 2 (two) times daily. (Patient not taking: Reported on 07/03/2021) 60 each 0   No current facility-administered medications on file prior to visit.    Allergies: No Known Allergies Past Medical History:  Past Medical History:  Diagnosis Date   A-fib ([place])    Atrial fibrillation ([place])    4 years ago started   Brain cancer ([facility]) 12/2020   Breast cancer ([facility] [place] [facility]) 2006   Colon polyps    Glaucoma    Hypercholesteremia    Hypertension    patient denies    Osteoporosis    Personal history of chemotherapy 2006   Personal history of radiation therapy 2006   Stroke (cerebrum) ([place]) 2015   Past Surgical History:  Past Surgical History:  Procedure Laterality Date   APPLICATION OF CRANIAL NAVIGATION Left 05/01/2021   Procedure: APPLICATION OF CRANIAL NAVIGATION;  Surgeon: Consuella Lose, MD;  Location: [facility];  Service: Neurosurgery;  Laterality: Left;   AUGMENTATION MAMMAPLASTY Bilateral 2006   Patient had them removed in 2008   BREAST BIOPSY Left 2016   BREAST LUMPECTOMY Right 2006   COLONOSCOPY     had polyps   CRANIOTOMY Left 05/01/2021   Procedure: STEREOTACTIC FRONTAL CRANIOTOMY TUMOR EXCISION;  Surgeon: Consuella Lose, MD;  Location: [facility];  Service: Neurosurgery;  Laterality: Left;   EYE SURGERY Right    cataract   IR IMAGING GUIDED PORT INSERTION  06/06/2021   IR US GUIDE VASC  ACCESS LEFT  06/06/2021   MELANOMA EXCISION WITH SENTINEL LYMPH NODE BIOPSY N/A 06/02/2019   Procedure: Left lateral ANAL MELANOMA EXCISION WITH Bilateral Groin exploration and SENTINEL node mapping;  Surgeon: [name], MD;  Location: [facility];  Service: General;  Laterality: N/A;   melanoma removal Left    crown   TONSILLECTOMY     Social History:  Social History   Socioeconomic History   Marital status: Widowed    Spouse name: Not on file   Number of children: 1   Years of education: Not on file   Highest education level: Not on file  Occupational History   Occupation: retired  Tobacco Use   Smoking status: Never    Passive exposure: Yes   Smokeless tobacco: Never   Tobacco comments:    smoked in  college but didn't inhale  Vaping Use   Vaping Use: Never used  Substance and Sexual Activity   Alcohol use: No    Alcohol/week: 0.0 standard drinks   Drug use: No   Sexual activity: Not Currently  Other Topics Concern   Not on file  Social History Narrative   Not on file   Social Determinants of Health   Financial Resource Strain: Not on file  Food Insecurity: Not on file  Transportation Needs: Not on file  Physical Activity: Not on file  Stress: Not on file  Social Connections: Not on file  Intimate Partner Violence: Not on file   Family History:  Family History  Problem Relation Age of Onset   Lung cancer Mother    Hypotension Mother    Pancreatic cancer Father    Hypertension Sister    Kidney cancer Brother    Cirrhosis Maternal Grandfather    Breast cancer Paternal Grandmother    Cirrhosis Paternal Grandfather    Breast cancer Paternal Aunt     Review of Systems: Constitutional: Doesn't report fevers, chills or abnormal weight loss Eyes: Doesn't report blurriness of vision Ears, nose, mouth, throat, and face: Doesn't report sore throat Respiratory: Doesn't report cough, dyspnea or wheezes Cardiovascular: Doesn't report palpitation, chest discomfort   Gastrointestinal:  Doesn't report nausea, constipation, diarrhea GU: Doesn't report incontinence Skin: Doesn't report skin rashes Neurological: Per HPI Musculoskeletal: Doesn't report joint pain Behavioral/Psych: Doesn't report anxiety  Physical Exam: Vitals:   07/03/21 1021  BP: (!) 146/70  Pulse: 87  Resp: 18  Temp: 97.8 F (36.6 C)  SpO2: 100%   KPS: 60 General: Alert, cooperative, pleasant, in no acute distress Head: Normal EENT: No conjunctival injection or scleral icterus.  Lungs: Resp effort normal Cardiac: Regular rate Abdomen: Non-distended abdomen Skin: No rashes cyanosis or petechiae. Extremities: No clubbing or edema  Neurologic Exam: Mental Status: Awake, alert, attentive to examiner. Oriented to self and environment. Language is fluent with intact comprehension.  Cranial Nerves: Visual acuity is grossly normal. Visual fields are full. Extra-ocular movements intact. No ptosis. Face is symmetric Motor: Tone and bulk are normal. Right arm 3/5, right leg 4/5. Reflexes are symmetric, no pathologic reflexes present.  Sensory: Intact to light touch Gait: Hemiparetic   Labs: I have reviewed the data as listed    Component Value Date/Time   NA 140 06/21/2021 1138   NA 138 12/05/2016 0750   K 3.0 (L) 06/21/2021 1138   CL 106 06/21/2021 1138   CO2 27 06/21/2021 1138   GLUCOSE 122 (H) 06/21/2021 1138   BUN 22 06/21/2021 1138   BUN 15 12/05/2016 0750   CREATININE 1.18 (H) 06/21/2021 1138   CREATININE 0.70 09/05/2015 0828   CALCIUM 8.9 06/21/2021 1138   PROT 5.8 (L) 06/21/2021 1138   PROT 6.6 12/05/2016 0750   ALBUMIN 2.5 (L) 06/21/2021 1138   ALBUMIN 4.4 12/05/2016 0750   AST 143 (H) 06/21/2021 1138   ALT 194 (H) 06/21/2021 1138   ALKPHOS 485 (H) 06/21/2021 1138   BILITOT 2.0 (H) 06/21/2021 1138   GFRNONAA 48 (L) 06/21/2021 1138   GFRAA >60 06/30/2020 0840   Lab Results  Component Value Date   WBC 14.4 (H) 06/21/2021   NEUTROABS 10.9 (H) 06/21/2021    HGB 10.3 (L) 06/21/2021   HCT 31.4 (L) 06/21/2021   MCV 97.5 06/21/2021   PLT 412 (H) 06/21/2021    Imaging:  [facility] Clinician Interpretation: I have personally reviewed the CNS images as  listed.  My interpretation, in the context of the patient's clinical presentation, is stable disease  MR BRAIN W WO CONTRAST  Result Date: 06/06/2021 CLINICAL DATA:  Brain/CNS neoplasm, assess treatment response. Metastatic melanoma. History of left frontal tumor resection. EXAM: MRI HEAD WITHOUT AND WITH CONTRAST TECHNIQUE: Multiplanar, multiecho pulse sequences of the brain and surrounding structures were obtained without and with intravenous contrast. CONTRAST:  66m MULTIHANCE GADOBENATE DIMEGLUMINE 529 MG/ML IV SOLN COMPARISON:  05/02/2021 FINDINGS: Brain: Sequelae of left frontal craniotomy and tumor resection are again identified. A 2 cm resection cavity in the posterior left frontal lobe has slightly decreased in size and contains chronic blood products. Mild enhancement along the margins of the resection cavity has increased from the immediate postoperative study without substantial nodularity. There is also minimal enhancement at the inferolateral aspects of the resection cavity which partially corresponds to the small infarct demonstrated on the prior MRI. There is mild surrounding edema which has greatly decreased. A small extra-axial fluid collection is noted subjacent to the craniotomy with mild extension along the falx without significant mass effect. No new enhancing intracranial lesion is identified. There is no acute infarct or midline shift. T2 hyperintensities elsewhere in the cerebral white matter bilaterally and in the pons are unchanged and nonspecific but compatible with mild chronic small vessel ischemic disease. Chronic lacunar infarcts are again noted in the left basal ganglia and corona radiata. There is mild cerebral atrophy. Vascular: Attenuated appearance of the left MCA related to known  chronic occlusion. Skull and upper cervical spine: Left frontal craniotomy. No suspicious marrow lesion. Sinuses/Orbits: Right cataract extraction. Mild posterior ethmoid air cell mucosal thickening on the left. No significant mastoid fluid. Other: None. IMPRESSION: 1. Increased, mild enhancement along the margins of the left frontal resection cavity favored to be postsurgical and postischemic. Decreased edema. 2. No evidence of new intracranial metastases. Electronically Signed   By: ALogan BoresM.D.   On: 06/06/2021 14:14   IR [place] Vasc Access Left  Result Date: 06/06/2021 INDICATION: Malignant melanoma of the anal canal with brain metastases. She requires durable venous access for chemotherapy. The patient's first 2 peripheral IVs infiltrated. Therefore, it was deemed necessary to place an ultrasound-guided IV and 1 of the deeper arm veins to allow for moderate sedation during the port catheter placement. EXAM: [place]UIDED IV START BY MD IMPLANTED PORT A CATH PLACEMENT WITH ULTRASOUND AND FLUOROSCOPIC GUIDANCE MEDICATIONS: None ANESTHESIA/SEDATION: Versed 1 mg IV; Fentanyl 50 mcg IV; Moderate Sedation Time:  23 minutes The patient was continuously monitored during the procedure by the interventional radiology nurse under my direct supervision. FLUOROSCOPY TIME:  0 minutes, 6 seconds (less than 1 mGy) COMPLICATIONS: None immediate. PROCEDURE: The right neck and chest was prepped with chlorhexidine, and draped in the usual sterile fashion using maximum barrier technique (cap and mask, sterile gown, sterile gloves, large sterile sheet, hand hygiene and cutaneous antiseptic). Local anesthesia was attained by infiltration with 1% lidocaine with epinephrine. The left cephalic vein was interrogated with ultrasound and found to be widely patent. An image was obtained and stored for the medical record. Local anesthesia was attained by infiltration with 1% lidocaine. A small dermatotomy was made. Under real-time  sonographic guidance, the vessel was punctured with a 21 gauge micropuncture needle. Using standard technique, the initial micro needle was exchanged over a 0.018 micro wire for a transitional 4 [place] sheath. The micro sheath was then capped with a valved side-arm. The catheter flushes and aspirates easily  and will serve well as an IV. The catheter was secured in place with a sterile bandage. Ultrasound demonstrated patency of the right internal jugular vein, and this was documented with an image. Under real-time ultrasound guidance, this vein was accessed with a 21 gauge micropuncture needle and image documentation was performed. A small dermatotomy was made at the access site with an 11 scalpel. A 0.018" wire was advanced into the SVC and the access needle exchanged for a 44F micropuncture vascular sheath. The 0.018" wire was then removed and a 0.035" wire advanced into the IVC. An appropriate location for the subcutaneous reservoir was selected below the clavicle and an incision was made through the skin and underlying soft tissues. The subcutaneous tissues were then dissected using a combination of blunt and sharp surgical technique and a pocket was formed. A single lumen power injectable portacatheter was then tunneled through the subcutaneous tissues from the pocket to the dermatotomy and the port reservoir placed within the subcutaneous pocket. The venous access site was then serially dilated and a peel away vascular sheath placed over the wire. The wire was removed and the port catheter advanced into position under fluoroscopic guidance. The catheter tip is positioned in the superior cavoatrial junction. This was documented with a spot image. The portacatheter was then tested and found to flush and aspirate well. The port was flushed with saline followed by 100 units/mL heparinized saline. The pocket was then closed using subdermal inverted interrupted absorbable sutures followed by Dermabond. The  dermatotomy at the venous access site was also closed with Dermabond. IMPRESSION: Successful placement of a right IJ approach Power Port with ultrasound and fluoroscopic guidance. The catheter is ready for use. Electronically Signed   By: Jacqulynn Cadet M.D.   On: 06/06/2021 11:05   IR IMAGING GUIDED PORT INSERTION  Result Date: 06/06/2021 INDICATION: Malignant melanoma of the anal canal with brain metastases. She requires durable venous access for chemotherapy. The patient's first 2 peripheral IVs infiltrated. Therefore, it was deemed necessary to place an ultrasound-guided IV and 1 of the deeper arm veins to allow for moderate sedation during the port catheter placement. EXAM: US GUIDED IV START BY MD IMPLANTED PORT A CATH PLACEMENT WITH ULTRASOUND AND FLUOROSCOPIC GUIDANCE MEDICATIONS: None ANESTHESIA/SEDATION: Versed 1 mg IV; Fentanyl 50 mcg IV; Moderate Sedation Time:  23 minutes The patient was continuously monitored during the procedure by the interventional radiology nurse under my direct supervision. FLUOROSCOPY TIME:  0 minutes, 6 seconds (less than 1 mGy) COMPLICATIONS: None immediate. PROCEDURE: The right neck and chest was prepped with chlorhexidine, and draped in the usual sterile fashion using maximum barrier technique (cap and mask, sterile gown, sterile gloves, large sterile sheet, hand hygiene and cutaneous antiseptic). Local anesthesia was attained by infiltration with 1% lidocaine with epinephrine. The left cephalic vein was interrogated with ultrasound and found to be widely patent. An image was obtained and stored for the medical record. Local anesthesia was attained by infiltration with 1% lidocaine. A small dermatotomy was made. Under real-time sonographic guidance, the vessel was punctured with a 21 gauge micropuncture needle. Using standard technique, the initial micro needle was exchanged over a 0.018 micro wire for a transitional 4 [place] micro sheath. The micro sheath was then  capped with a valved side-arm. The catheter flushes and aspirates easily and will serve well as an IV. The catheter was secured in place with a sterile bandage. Ultrasound demonstrated patency of the right internal jugular vein, and this was documented with  an image. Under real-time ultrasound guidance, this vein was accessed with a 21 gauge micropuncture needle and image documentation was performed. A small dermatotomy was made at the access site with an 11 scalpel. A 0.018" wire was advanced into the SVC and the access needle exchanged for a 73F micropuncture vascular sheath. The 0.018" wire was then removed and a 0.035" wire advanced into the IVC. An appropriate location for the subcutaneous reservoir was selected below the clavicle and an incision was made through the skin and underlying soft tissues. The subcutaneous tissues were then dissected using a combination of blunt and sharp surgical technique and a pocket was formed. A single lumen power injectable portacatheter was then tunneled through the subcutaneous tissues from the pocket to the dermatotomy and the port reservoir placed within the subcutaneous pocket. The venous access site was then serially dilated and a peel away vascular sheath placed over the wire. The wire was removed and the port catheter advanced into position under fluoroscopic guidance. The catheter tip is positioned in the superior cavoatrial junction. This was documented with a spot image. The portacatheter was then tested and found to flush and aspirate well. The port was flushed with saline followed by 100 units/mL heparinized saline. The pocket was then closed using subdermal inverted interrupted absorbable sutures followed by Dermabond. The dermatotomy at the venous access site was also closed with Dermabond. IMPRESSION: Successful placement of a right IJ approach Power Port with ultrasound and fluoroscopic guidance. The catheter is ready for use. Electronically Signed   By: Jacqulynn Cadet M.D.   On: 06/06/2021 11:05    Assessment/Plan Metastatic cancer to brain [facility]) [C79.31]  Angela Carpenter is clinically improved today since re-initiation of dexamethasone.  MRI from 8/30 demonstrates stable post-operative changes.  We will con't to try to limit steroid dose due to concurrent dual immunotherapy.  For now, will ask she decrease to 103m daily once feeling stable for at least a week from functional standpoint.  Will con't aggressive PT for motor deficits.  Should continue to hold Eliquis due to ongoing CNS hemorrhage risk, good control of atrial fibrillation.    Will otherwise continue Keppra 5025mBID.  We ask that SuMALENE BLAYDESeturn to clinic in December following next MRI brain, or sooner if needed.  We appreciate the opportunity to participate in the care of Angela Carpenter  All questions were answered. The patient knows to call the clinic with any problems, questions or concerns. No barriers to learning were detected.  The total time spent in the encounter was 40 minutes and more than 50% was on counseling and review of test results   ZaVentura SellersMD Medical Director of Neuro-Oncology [facility] WeKennett Square9/27/22 11:27 AM

## 2021-07-04 ENCOUNTER — Inpatient Hospital Stay: Payer: Medicare Other

## 2021-07-04 DIAGNOSIS — C7931 Secondary malignant neoplasm of brain: Secondary | ICD-10-CM | POA: Diagnosis not present

## 2021-07-04 DIAGNOSIS — G8191 Hemiplegia, unspecified affecting right dominant side: Secondary | ICD-10-CM | POA: Diagnosis not present

## 2021-07-04 DIAGNOSIS — I82431 Acute embolism and thrombosis of right popliteal vein: Secondary | ICD-10-CM | POA: Diagnosis not present

## 2021-07-04 DIAGNOSIS — R4701 Aphasia: Secondary | ICD-10-CM | POA: Diagnosis not present

## 2021-07-04 DIAGNOSIS — I10 Essential (primary) hypertension: Secondary | ICD-10-CM | POA: Diagnosis not present

## 2021-07-04 DIAGNOSIS — C434 Malignant melanoma of scalp and neck: Secondary | ICD-10-CM | POA: Diagnosis not present

## 2021-07-05 ENCOUNTER — Encounter: Payer: Self-pay | Admitting: Oncology

## 2021-07-05 DIAGNOSIS — Z85828 Personal history of other malignant neoplasm of skin: Secondary | ICD-10-CM | POA: Diagnosis not present

## 2021-07-05 DIAGNOSIS — L814 Other melanin hyperpigmentation: Secondary | ICD-10-CM | POA: Diagnosis not present

## 2021-07-05 DIAGNOSIS — L309 Dermatitis, unspecified: Secondary | ICD-10-CM | POA: Diagnosis not present

## 2021-07-05 DIAGNOSIS — L853 Xerosis cutis: Secondary | ICD-10-CM | POA: Diagnosis not present

## 2021-07-05 DIAGNOSIS — L821 Other seborrheic keratosis: Secondary | ICD-10-CM | POA: Diagnosis not present

## 2021-07-05 DIAGNOSIS — Z85820 Personal history of malignant melanoma of skin: Secondary | ICD-10-CM | POA: Diagnosis not present

## 2021-07-06 ENCOUNTER — Telehealth: Payer: Self-pay | Admitting: Physical Medicine & Rehabilitation

## 2021-07-06 ENCOUNTER — Telehealth: Payer: Self-pay

## 2021-07-06 DIAGNOSIS — C7931 Secondary malignant neoplasm of brain: Secondary | ICD-10-CM | POA: Diagnosis not present

## 2021-07-06 DIAGNOSIS — I10 Essential (primary) hypertension: Secondary | ICD-10-CM | POA: Diagnosis not present

## 2021-07-06 DIAGNOSIS — R4701 Aphasia: Secondary | ICD-10-CM | POA: Diagnosis not present

## 2021-07-06 DIAGNOSIS — C434 Malignant melanoma of scalp and neck: Secondary | ICD-10-CM | POA: Diagnosis not present

## 2021-07-06 DIAGNOSIS — I82431 Acute embolism and thrombosis of right popliteal vein: Secondary | ICD-10-CM | POA: Diagnosis not present

## 2021-07-06 DIAGNOSIS — G8191 Hemiplegia, unspecified affecting right dominant side: Secondary | ICD-10-CM | POA: Diagnosis not present

## 2021-07-06 NOTE — Telephone Encounter (Signed)
[place] with [facility] needs to get verbal orders to see patient 1w3 starting next week.  Please call her at [phone].

## 2021-07-06 NOTE — Telephone Encounter (Signed)
Orders approved.

## 2021-07-06 NOTE — Telephone Encounter (Signed)
Allie, PT from [facility] called request frequency change form 1 time a week to 2 times a week. Orders approved.

## 2021-07-08 ENCOUNTER — Encounter: Payer: Self-pay | Admitting: Oncology

## 2021-07-09 DIAGNOSIS — C7931 Secondary malignant neoplasm of brain: Secondary | ICD-10-CM | POA: Diagnosis not present

## 2021-07-09 DIAGNOSIS — C434 Malignant melanoma of scalp and neck: Secondary | ICD-10-CM | POA: Diagnosis not present

## 2021-07-09 DIAGNOSIS — I10 Essential (primary) hypertension: Secondary | ICD-10-CM | POA: Diagnosis not present

## 2021-07-09 DIAGNOSIS — R4701 Aphasia: Secondary | ICD-10-CM | POA: Diagnosis not present

## 2021-07-09 DIAGNOSIS — I82431 Acute embolism and thrombosis of right popliteal vein: Secondary | ICD-10-CM | POA: Diagnosis not present

## 2021-07-09 DIAGNOSIS — G8191 Hemiplegia, unspecified affecting right dominant side: Secondary | ICD-10-CM | POA: Diagnosis not present

## 2021-07-10 ENCOUNTER — Other Ambulatory Visit: Payer: Self-pay | Admitting: Internal Medicine

## 2021-07-10 DIAGNOSIS — C7931 Secondary malignant neoplasm of brain: Secondary | ICD-10-CM | POA: Diagnosis not present

## 2021-07-10 DIAGNOSIS — R4701 Aphasia: Secondary | ICD-10-CM | POA: Diagnosis not present

## 2021-07-10 DIAGNOSIS — I10 Essential (primary) hypertension: Secondary | ICD-10-CM | POA: Diagnosis not present

## 2021-07-10 DIAGNOSIS — C434 Malignant melanoma of scalp and neck: Secondary | ICD-10-CM | POA: Diagnosis not present

## 2021-07-10 DIAGNOSIS — I82431 Acute embolism and thrombosis of right popliteal vein: Secondary | ICD-10-CM | POA: Diagnosis not present

## 2021-07-10 DIAGNOSIS — G8191 Hemiplegia, unspecified affecting right dominant side: Secondary | ICD-10-CM | POA: Diagnosis not present

## 2021-07-11 DIAGNOSIS — G8191 Hemiplegia, unspecified affecting right dominant side: Secondary | ICD-10-CM | POA: Diagnosis not present

## 2021-07-11 DIAGNOSIS — I10 Essential (primary) hypertension: Secondary | ICD-10-CM | POA: Diagnosis not present

## 2021-07-11 DIAGNOSIS — C434 Malignant melanoma of scalp and neck: Secondary | ICD-10-CM | POA: Diagnosis not present

## 2021-07-11 DIAGNOSIS — C7931 Secondary malignant neoplasm of brain: Secondary | ICD-10-CM | POA: Diagnosis not present

## 2021-07-11 DIAGNOSIS — I82431 Acute embolism and thrombosis of right popliteal vein: Secondary | ICD-10-CM | POA: Diagnosis not present

## 2021-07-11 DIAGNOSIS — R4701 Aphasia: Secondary | ICD-10-CM | POA: Diagnosis not present

## 2021-07-12 ENCOUNTER — Other Ambulatory Visit: Payer: Self-pay | Admitting: Oncology

## 2021-07-12 ENCOUNTER — Inpatient Hospital Stay (HOSPITAL_BASED_OUTPATIENT_CLINIC_OR_DEPARTMENT_OTHER): Payer: Medicare Other | Admitting: Oncology

## 2021-07-12 ENCOUNTER — Inpatient Hospital Stay: Payer: Medicare Other | Attending: Oncology

## 2021-07-12 ENCOUNTER — Telehealth: Payer: Self-pay | Admitting: *Deleted

## 2021-07-12 ENCOUNTER — Inpatient Hospital Stay: Payer: Medicare Other

## 2021-07-12 ENCOUNTER — Other Ambulatory Visit: Payer: Self-pay

## 2021-07-12 VITALS — BP 128/61 | HR 83 | Temp 98.9°F | Resp 16 | Ht 65.0 in | Wt 150.0 lb

## 2021-07-12 DIAGNOSIS — C211 Malignant neoplasm of anal canal: Secondary | ICD-10-CM | POA: Insufficient documentation

## 2021-07-12 DIAGNOSIS — N1330 Unspecified hydronephrosis: Secondary | ICD-10-CM | POA: Diagnosis not present

## 2021-07-12 DIAGNOSIS — K754 Autoimmune hepatitis: Secondary | ICD-10-CM | POA: Diagnosis not present

## 2021-07-12 DIAGNOSIS — R7989 Other specified abnormal findings of blood chemistry: Secondary | ICD-10-CM | POA: Diagnosis not present

## 2021-07-12 DIAGNOSIS — Z9221 Personal history of antineoplastic chemotherapy: Secondary | ICD-10-CM | POA: Diagnosis not present

## 2021-07-12 DIAGNOSIS — E039 Hypothyroidism, unspecified: Secondary | ICD-10-CM

## 2021-07-12 DIAGNOSIS — Z79899 Other long term (current) drug therapy: Secondary | ICD-10-CM | POA: Insufficient documentation

## 2021-07-12 DIAGNOSIS — Z7952 Long term (current) use of systemic steroids: Secondary | ICD-10-CM | POA: Diagnosis not present

## 2021-07-12 DIAGNOSIS — C7931 Secondary malignant neoplasm of brain: Secondary | ICD-10-CM | POA: Diagnosis not present

## 2021-07-12 DIAGNOSIS — Z95828 Presence of other vascular implants and grafts: Secondary | ICD-10-CM

## 2021-07-12 LAB — CMP (CANCER CENTER ONLY)
ALT: 320 U/L (ref 0–44)
AST: 253 U/L (ref 15–41)
Albumin: 2.6 g/dL — ABNORMAL LOW (ref 3.5–5.0)
Alkaline Phosphatase: 685 U/L — ABNORMAL HIGH (ref 38–126)
Anion gap: 9 (ref 5–15)
BUN: 20 mg/dL (ref 8–23)
CO2: 23 mmol/L (ref 22–32)
Calcium: 8.7 mg/dL — ABNORMAL LOW (ref 8.9–10.3)
Chloride: 110 mmol/L (ref 98–111)
Creatinine: 1.39 mg/dL — ABNORMAL HIGH (ref 0.44–1.00)
GFR, Estimated: 39 mL/min — ABNORMAL LOW (ref >60)
Glucose, Bld: 126 mg/dL — ABNORMAL HIGH (ref 70–99)
Potassium: 4 mmol/L (ref 3.5–5.1)
Sodium: 142 mmol/L (ref 135–145)
Total Bilirubin: 1.6 mg/dL — ABNORMAL HIGH (ref 0.3–1.2)
Total Protein: 5.6 g/dL — ABNORMAL LOW (ref 6.5–8.1)

## 2021-07-12 LAB — CBC WITH DIFFERENTIAL (CANCER CENTER ONLY)
Abs Immature Granulocytes: 0.24 10*3/uL — ABNORMAL HIGH (ref 0.00–0.07)
Basophils Absolute: 0.1 10*3/uL (ref 0.0–0.1)
Basophils Relative: 1 %
Eosinophils Absolute: 1.6 10*3/uL — ABNORMAL HIGH (ref 0.0–0.5)
Eosinophils Relative: 12 %
HCT: 28.4 % — ABNORMAL LOW (ref 36.0–46.0)
Hemoglobin: 9.4 g/dL — ABNORMAL LOW (ref 12.0–15.0)
Immature Granulocytes: 2 %
Lymphocytes Relative: 5 %
Lymphs Abs: 0.7 10*3/uL (ref 0.7–4.0)
MCH: 31.9 pg (ref 26.0–34.0)
MCHC: 33.1 g/dL (ref 30.0–36.0)
MCV: 96.3 fL (ref 80.0–100.0)
Monocytes Absolute: 0.9 10*3/uL (ref 0.1–1.0)
Monocytes Relative: 7 %
Neutro Abs: 10.6 10*3/uL — ABNORMAL HIGH (ref 1.7–7.7)
Neutrophils Relative %: 73 %
Platelet Count: 284 10*3/uL (ref 150–400)
RBC: 2.95 MIL/uL — ABNORMAL LOW (ref 3.87–5.11)
RDW: 14.6 % (ref 11.5–15.5)
WBC Count: 14.1 10*3/uL — ABNORMAL HIGH (ref 4.0–10.5)
nRBC: 0 % (ref 0.0–0.2)

## 2021-07-12 LAB — TSH: TSH: 2.293 u[IU]/mL (ref 0.308–3.960)

## 2021-07-12 MED ORDER — HEPARIN SOD (PORK) LOCK FLUSH 100 UNIT/ML IV SOLN
500.0000 [IU] | Freq: Once | INTRAVENOUS | Status: AC
  Administered 2021-07-12: 500 [IU]

## 2021-07-12 MED ORDER — SODIUM CHLORIDE 0.9% FLUSH
10.0000 mL | Freq: Once | INTRAVENOUS | Status: AC
  Administered 2021-07-12: 10 mL

## 2021-07-12 MED ORDER — DEXAMETHASONE 1 MG PO TABS: 1.0000 mg | ORAL_TABLET | Freq: Two times a day (BID) | ORAL | 1 refills | Status: DC

## 2021-07-12 NOTE — Telephone Encounter (Signed)
Duplicate request

## 2021-07-12 NOTE — Progress Notes (Signed)
Hematology and Oncology Follow Up Visit  Angela Carpenter [id number] May 30, 1941 80 y.o. 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  She developed CNS disease in March 2022 in addition to pelvic involvement.  Prior Therapy:   She is status post perianal excision in August 2020.  Stereotactic radiosurgery completed on December 28, 2020.  He received 18 Gray in 1 fraction.  Pembrolizumab 200 mg every 3 weeks started on July 29, 2019.  Last treatment given on November 08, 2020.  She is status post left frontal craniotomy and resection of her residual melanoma completed on May 01, 2021.  Pembrolizumab 400 mg every 6 weeks started on Feb 15, 2021.  Last treatment given on March 29, 2021.  Stereotactic radiosurgery started on September 6.  She completed treatment on 06/22/2021.  Current therapy:     Ipilimumab 1 mg/kg with nivolumab 3 mg/kg started on 06/21/2021.  She is here for cycle 2 of therapy.   Interim History: [name] presents today for repeat evaluation.  Since the last visit, she has reported increased fatigue and tiredness and overall weakness after the first cycle of treatment.  She was started on low-dose dexamethasone which has improved her symptoms and currently on 2 mg daily.  She is eating better and has gained more weight.  Her mobility is improving and participating with physical therapy.     Medications: Reviewed without changes. Current Outpatient Medications  Medication Sig Dispense Refill   dexamethasone (DECADRON) 1 MG tablet Take 1 tablet (1 mg total) by mouth 2 (two) times daily with a meal. 30 tablet 1   acyclovir (ZOVIRAX) 400 MG tablet Take 1 tablet (400 mg total) by mouth 2 (two) times daily. 180 tablet 2   atorvastatin (LIPITOR) 10 MG tablet Take 1 tablet (10 mg total) by mouth daily. 90 tablet 1   bethanechol (URECHOLINE) 50  MG tablet Take 1 tablet (50 mg total) by mouth 3 (three) times daily. 180 tablet 2   camphor-menthol (SARNA) lotion Apply topically as needed for itching. 222 mL 0   Cholecalciferol (VITAMIN D-3) 125 MCG (5000 UT) TABS Take 5,000 Units by mouth daily. 30 tablet 3   dexamethasone (DECADRON) 2 MG tablet Take 1 tablet (2 mg total) by mouth See admin instructions. Take two tablets (4 mg total) in morning and evening for 3 days and then take one tablet (2 mg total) daily thereafter. 30 tablet 0   levETIRAcetam (KEPPRA) 750 MG tablet Take 1 tablet (750 mg total) by mouth 2 (two) times daily. 180 tablet 2   lidocaine-prilocaine (EMLA) cream Apply 1 application topically as needed. 30 g 0   metoprolol succinate (TOPROL-XL) 25 MG 24 hr tablet Take 1 tablet (25 mg total) by mouth 2 (two) times daily. 180 tablet 2   Multiple Vitamins-Minerals (PRESERVISION AREDS 2+MULTI VIT) CAPS Take 1 capsule by mouth 2 (two) times daily.     nutrition supplement, JUVEN, (JUVEN) PACK Take 1 packet by mouth 2 (two) times daily between meals. (Patient not taking: Reported on 07/03/2021) 60 packet 0   ondansetron (ZOFRAN) 4 MG tablet Take 1 tablet (4 mg total) by mouth every 4 (four) hours as needed for nausea or vomiting. 90 tablet 1   pantoprazole (PROTONIX) 40 MG tablet Take 1 tablet (40 mg total) by mouth at bedtime. 90 tablet 2   polyethylene glycol (MIRALAX / GLYCOLAX) 17 g  packet Take 17 g by mouth 2 (two) times daily. (Patient not taking: Reported on 07/03/2021) 60 each 0   raloxifene (EVISTA) 60 MG tablet Take 1 tablet (60 mg total) by mouth in the morning. 90 tablet 2   senna-docusate (SENOKOT-S) 8.6-50 MG tablet Take 1 tablet by mouth 2 (two) times daily. 60 tablet 0   tamsulosin (FLOMAX) 0.4 MG CAPS capsule Take 1 capsule (0.4 mg total) by mouth daily after supper. 30 capsule 0   timolol (TIMOPTIC) 0.5 % ophthalmic solution Place 1 drop into both eyes 2 (two) times daily. 30 mL 2   No current facility-administered  medications for this visit.     Allergies: No Known Allergies     Physical Exam:      Blood pressure 128/61, pulse 83, temperature 98.9 F (37.2 C), temperature source Tympanic, resp. rate 16, height 5\' 5"  (1.651 m), weight 150 lb (68 kg), SpO2 100 %.    ECOG: 1   General appearance: Alert, awake without any distress. Head: Atraumatic without abnormalities Oropharynx: Without any thrush or ulcers. Eyes: No scleral icterus. Lymph nodes: No lymphadenopathy noted in the cervical, supraclavicular, or axillary nodes Heart:regular rate and rhythm, without any murmurs or gallops.   Lung: Clear to auscultation without any rhonchi, wheezes or dullness to percussion. Abdomin: Soft, nontender without any shifting dullness or ascites. Musculoskeletal: No clubbing or cyanosis. Neurological: No motor or sensory deficits. Skin: No rashes or lesions.                                 Lab Results: Lab Results  Component Value Date   WBC 14.1 (H) 07/12/2021   HGB 9.4 (L) 07/12/2021   HCT 28.4 (L) 07/12/2021   MCV 96.3 07/12/2021   PLT 284 07/12/2021     Chemistry      Component Value Date/Time   NA 140 06/21/2021 1138   NA 138 12/05/2016 0750   K 3.0 (L) 06/21/2021 1138   CL 106 06/21/2021 1138   CO2 27 06/21/2021 1138   BUN 22 06/21/2021 1138   BUN 15 12/05/2016 0750   CREATININE 1.18 (H) 06/21/2021 1138   CREATININE 0.70 09/05/2015 0828      Component Value Date/Time   CALCIUM 8.9 06/21/2021 1138   ALKPHOS 485 (H) 06/21/2021 1138   AST 143 (H) 06/21/2021 1138   ALT 194 (H) 06/21/2021 1138   BILITOT 2.0 (H) 06/21/2021 1138        Impression and Plan:  80 year old with:   1.   Stage IV mucosal melanoma with CNS involvement documented in March 2022.     She is status post the first cycle of ipilimumab and nivolumab and ready to proceed with cycle 2.  Risks and benefits of continuing this treatment were discussed.  Complications  include immune mediated issues were reiterated.  She is agreeable to proceed and we will update her staging scans after cycle 4.   2.  Immune mediated complications: She is not experiencing any at this time.  She does have mild dermatitis which has started prior to infusion.  Other complications including hypophysitis, pneumonitis among others will continue to be monitored.   3.  IV access: Port-A-Cath remains in use without any issues.   4.  Antiemetics: Compazine is available to her without any nausea or vomiting.  5.  CNS metastasis: She is status post so surgical resection outlined above.  She is on tapering  doses of dexamethasone which have helped her recovery.  6.  Elevated liver function test: This has preceded her immunotherapy and these numbers will be repeated today.  7.   Follow-up: In 3 weeks for repeat follow-up.  30 minutes were dedicated to this visit.  Time was spent on reviewing laboratory data, disease status update, treatment choices and future plan of care discussion.   Zola Button, MD 10/6/202211:41 AM

## 2021-07-12 NOTE — Progress Notes (Signed)
The results of her liver function tests today were reviewed and discussed with the patient and her son.  It is unclear to me the etiology of her transaminases.  Although I doubt this is an immune mediated hepatitis it would be reasonable to hold immunotherapy until we determine the etiology.  I will arrange for repeat laboratory testing in a week as well as obtain a CT scan of the abdomen and pelvis to rule out acute liver pathology.

## 2021-07-12 NOTE — Telephone Encounter (Signed)
AST 253 ALT 320. Provider made aware

## 2021-07-13 ENCOUNTER — Encounter: Payer: Self-pay | Admitting: Oncology

## 2021-07-13 DIAGNOSIS — R4701 Aphasia: Secondary | ICD-10-CM | POA: Diagnosis not present

## 2021-07-13 DIAGNOSIS — I82431 Acute embolism and thrombosis of right popliteal vein: Secondary | ICD-10-CM | POA: Diagnosis not present

## 2021-07-13 DIAGNOSIS — G8191 Hemiplegia, unspecified affecting right dominant side: Secondary | ICD-10-CM | POA: Diagnosis not present

## 2021-07-13 DIAGNOSIS — I10 Essential (primary) hypertension: Secondary | ICD-10-CM | POA: Diagnosis not present

## 2021-07-13 DIAGNOSIS — C7931 Secondary malignant neoplasm of brain: Secondary | ICD-10-CM | POA: Diagnosis not present

## 2021-07-13 DIAGNOSIS — C434 Malignant melanoma of scalp and neck: Secondary | ICD-10-CM | POA: Diagnosis not present

## 2021-07-16 DIAGNOSIS — C7931 Secondary malignant neoplasm of brain: Secondary | ICD-10-CM | POA: Diagnosis not present

## 2021-07-16 DIAGNOSIS — R4701 Aphasia: Secondary | ICD-10-CM | POA: Diagnosis not present

## 2021-07-16 DIAGNOSIS — C434 Malignant melanoma of scalp and neck: Secondary | ICD-10-CM | POA: Diagnosis not present

## 2021-07-16 DIAGNOSIS — G8191 Hemiplegia, unspecified affecting right dominant side: Secondary | ICD-10-CM | POA: Diagnosis not present

## 2021-07-16 DIAGNOSIS — I82431 Acute embolism and thrombosis of right popliteal vein: Secondary | ICD-10-CM | POA: Diagnosis not present

## 2021-07-16 DIAGNOSIS — I10 Essential (primary) hypertension: Secondary | ICD-10-CM | POA: Diagnosis not present

## 2021-07-17 ENCOUNTER — Encounter: Payer: Self-pay | Admitting: Oncology

## 2021-07-17 DIAGNOSIS — I82431 Acute embolism and thrombosis of right popliteal vein: Secondary | ICD-10-CM | POA: Diagnosis not present

## 2021-07-17 DIAGNOSIS — G8191 Hemiplegia, unspecified affecting right dominant side: Secondary | ICD-10-CM | POA: Diagnosis not present

## 2021-07-17 DIAGNOSIS — C7931 Secondary malignant neoplasm of brain: Secondary | ICD-10-CM | POA: Diagnosis not present

## 2021-07-17 DIAGNOSIS — R4701 Aphasia: Secondary | ICD-10-CM | POA: Diagnosis not present

## 2021-07-17 DIAGNOSIS — I10 Essential (primary) hypertension: Secondary | ICD-10-CM | POA: Diagnosis not present

## 2021-07-17 DIAGNOSIS — C434 Malignant melanoma of scalp and neck: Secondary | ICD-10-CM | POA: Diagnosis not present

## 2021-07-18 ENCOUNTER — Other Ambulatory Visit: Payer: Self-pay | Admitting: *Deleted

## 2021-07-18 ENCOUNTER — Other Ambulatory Visit: Payer: Self-pay | Admitting: Oncology

## 2021-07-18 ENCOUNTER — Telehealth: Payer: Self-pay | Admitting: *Deleted

## 2021-07-18 ENCOUNTER — Other Ambulatory Visit: Payer: Self-pay

## 2021-07-18 ENCOUNTER — Inpatient Hospital Stay: Payer: Medicare Other

## 2021-07-18 DIAGNOSIS — R7989 Other specified abnormal findings of blood chemistry: Secondary | ICD-10-CM | POA: Diagnosis not present

## 2021-07-18 DIAGNOSIS — L509 Urticaria, unspecified: Secondary | ICD-10-CM | POA: Diagnosis not present

## 2021-07-18 DIAGNOSIS — Z9221 Personal history of antineoplastic chemotherapy: Secondary | ICD-10-CM | POA: Diagnosis not present

## 2021-07-18 DIAGNOSIS — E039 Hypothyroidism, unspecified: Secondary | ICD-10-CM

## 2021-07-18 DIAGNOSIS — C211 Malignant neoplasm of anal canal: Secondary | ICD-10-CM

## 2021-07-18 DIAGNOSIS — K754 Autoimmune hepatitis: Secondary | ICD-10-CM | POA: Diagnosis not present

## 2021-07-18 DIAGNOSIS — N1330 Unspecified hydronephrosis: Secondary | ICD-10-CM | POA: Diagnosis not present

## 2021-07-18 DIAGNOSIS — C7931 Secondary malignant neoplasm of brain: Secondary | ICD-10-CM | POA: Diagnosis not present

## 2021-07-18 DIAGNOSIS — Z85820 Personal history of malignant melanoma of skin: Secondary | ICD-10-CM | POA: Diagnosis not present

## 2021-07-18 LAB — CMP (CANCER CENTER ONLY)
ALT: 644 U/L (ref 10–47)
AST: 499 U/L (ref 11–38)
Albumin: 2.9 g/dL — ABNORMAL LOW (ref 3.5–5.0)
Alkaline Phosphatase: 692 U/L — ABNORMAL HIGH (ref 38–126)
Anion gap: 8 (ref 5–15)
BUN: 18 mg/dL (ref 8–23)
CO2: 23 mmol/L (ref 22–32)
Calcium: 9.5 mg/dL (ref 8.9–10.3)
Chloride: 114 mmol/L — ABNORMAL HIGH (ref 98–111)
Creatinine: 1.38 mg/dL — ABNORMAL HIGH (ref 0.44–1.00)
GFR, Estimated: 39 mL/min — ABNORMAL LOW (ref >60)
Glucose, Bld: 151 mg/dL — ABNORMAL HIGH (ref 70–99)
Potassium: 3.8 mmol/L (ref 3.5–5.1)
Sodium: 145 mmol/L (ref 135–145)
Total Bilirubin: 1.9 mg/dL — ABNORMAL HIGH (ref 0.3–1.2)
Total Protein: 5.8 g/dL — ABNORMAL LOW (ref 6.5–8.1)

## 2021-07-18 LAB — CBC WITH DIFFERENTIAL (CANCER CENTER ONLY)
Abs Immature Granulocytes: 0.22 10*3/uL — ABNORMAL HIGH (ref 0.00–0.07)
Basophils Absolute: 0.1 10*3/uL (ref 0.0–0.1)
Basophils Relative: 1 %
Eosinophils Absolute: 0.8 10*3/uL — ABNORMAL HIGH (ref 0.0–0.5)
Eosinophils Relative: 7 %
HCT: 29 % — ABNORMAL LOW (ref 36.0–46.0)
Hemoglobin: 9.3 g/dL — ABNORMAL LOW (ref 12.0–15.0)
Immature Granulocytes: 2 %
Lymphocytes Relative: 7 %
Lymphs Abs: 0.7 10*3/uL (ref 0.7–4.0)
MCH: 31.2 pg (ref 26.0–34.0)
MCHC: 32.1 g/dL (ref 30.0–36.0)
MCV: 97.3 fL (ref 80.0–100.0)
Monocytes Absolute: 0.9 10*3/uL (ref 0.1–1.0)
Monocytes Relative: 8 %
Neutro Abs: 8.2 10*3/uL — ABNORMAL HIGH (ref 1.7–7.7)
Neutrophils Relative %: 75 %
Platelet Count: 325 10*3/uL (ref 150–400)
RBC: 2.98 MIL/uL — ABNORMAL LOW (ref 3.87–5.11)
RDW: 15.5 % (ref 11.5–15.5)
WBC Count: 10.9 10*3/uL — ABNORMAL HIGH (ref 4.0–10.5)
nRBC: 0 % (ref 0.0–0.2)

## 2021-07-18 LAB — TSH: TSH: 1.284 u[IU]/mL (ref 0.350–4.500)

## 2021-07-18 MED ORDER — TRIAMCINOLONE ACETONIDE 0.5 % EX OINT: 1.0000 "application " | TOPICAL_OINTMENT | Freq: Two times a day (BID) | CUTANEOUS | 0 refills | Status: DC

## 2021-07-18 NOTE — Telephone Encounter (Signed)
Critical lab received, on call provider Dr. Irene Limbo made aware.  RN called patient to inform her of elevated LFTs.  Received permission to speak to her son, [name]. Per son [name]) patient has not had any pain or fevers.  RN informed son patient should avoid tylenol and hepatotoxic medications.  Son verbalized acknowledgment and is aware of CT appt on 07/19/2021.

## 2021-07-18 NOTE — Telephone Encounter (Signed)
Son states Angela Carpenter is "having a dermatologic problem, hives. Dr Alen Blew saw it before it got bad. Has spread exponentially. Saw dermatologist has started on OTC zyrtec and hydroxyzine. Will be on for ~ 1 month. Has decided to decrease decadron to 1 mg on Sunday"

## 2021-07-18 NOTE — Progress Notes (Signed)
CRITICAL VALUE STICKER  CRITICAL VALUE: AST 499, ALT 644  RECEIVER (on-site recipient of call): Jiles Prows, RN  [place] NOTIFIED: [phone]  MESSENGER (representative from lab):  MD NOTIFIED: Dede Query, PA  TIME OF NOTIFICATION: [phone]

## 2021-07-19 ENCOUNTER — Other Ambulatory Visit: Payer: Medicare Other

## 2021-07-19 ENCOUNTER — Ambulatory Visit (HOSPITAL_COMMUNITY)
Admission: RE | Admit: 2021-07-19 | Discharge: 2021-07-19 | Disposition: A | Discharge status: Home / Self Care | Payer: Medicare Other | Source: Ambulatory Visit | Attending: Oncology | Admitting: Oncology

## 2021-07-19 ENCOUNTER — Inpatient Hospital Stay: Payer: Medicare Other

## 2021-07-19 DIAGNOSIS — C211 Malignant neoplasm of anal canal: Secondary | ICD-10-CM | POA: Insufficient documentation

## 2021-07-19 DIAGNOSIS — I10 Essential (primary) hypertension: Secondary | ICD-10-CM | POA: Diagnosis not present

## 2021-07-19 DIAGNOSIS — Z85820 Personal history of malignant melanoma of skin: Secondary | ICD-10-CM | POA: Diagnosis not present

## 2021-07-19 DIAGNOSIS — N3289 Other specified disorders of bladder: Secondary | ICD-10-CM | POA: Diagnosis not present

## 2021-07-19 DIAGNOSIS — N1330 Unspecified hydronephrosis: Secondary | ICD-10-CM | POA: Diagnosis not present

## 2021-07-19 DIAGNOSIS — M4316 Spondylolisthesis, lumbar region: Secondary | ICD-10-CM | POA: Diagnosis not present

## 2021-07-19 DIAGNOSIS — R4701 Aphasia: Secondary | ICD-10-CM | POA: Diagnosis not present

## 2021-07-19 DIAGNOSIS — I82431 Acute embolism and thrombosis of right popliteal vein: Secondary | ICD-10-CM | POA: Diagnosis not present

## 2021-07-19 DIAGNOSIS — C434 Malignant melanoma of scalp and neck: Secondary | ICD-10-CM | POA: Diagnosis not present

## 2021-07-19 DIAGNOSIS — C7931 Secondary malignant neoplasm of brain: Secondary | ICD-10-CM | POA: Diagnosis not present

## 2021-07-19 DIAGNOSIS — G8191 Hemiplegia, unspecified affecting right dominant side: Secondary | ICD-10-CM | POA: Diagnosis not present

## 2021-07-19 IMAGING — CT CT ABD-PELV W/O CM
2 of 4 series · 15 of 46 positions shown, 17 images · non-contrast
Comparison: Head CT dated [DATE]

CLINICAL DATA: History of melanoma

EXAM:
CT ABDOMEN AND PELVIS WITHOUT CONTRAST
TECHNIQUE: Multidetector CT imaging of the abdomen and pelvis was performed
following the standard protocol without IV contrast.

[Series 2: axial st · axial · 0.80mm/px · z∈[-692,-292]mm · 12 of 90 slices shown, 14 images]
[im 5/90  soft-tissue]
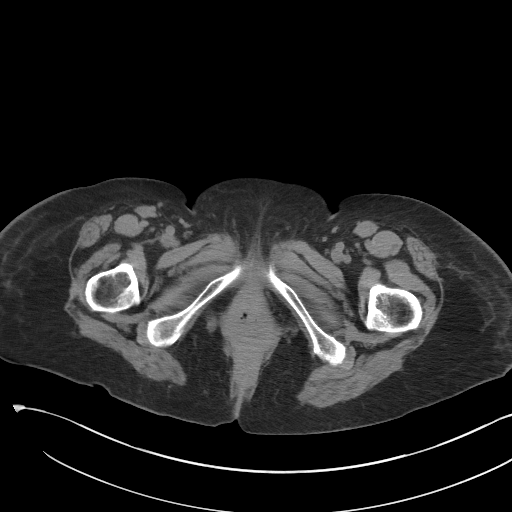
[im 5/90  bone]
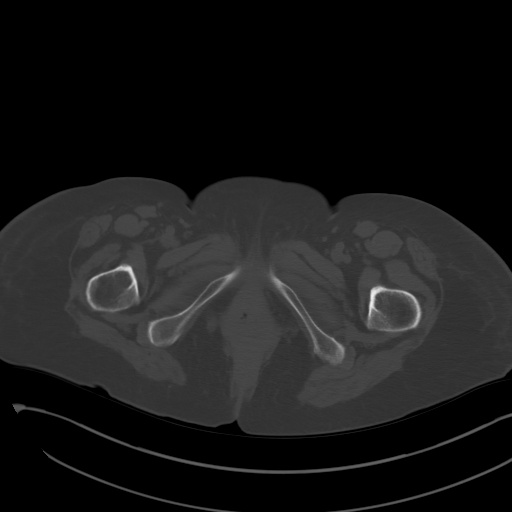
[im 15/90  soft-tissue]
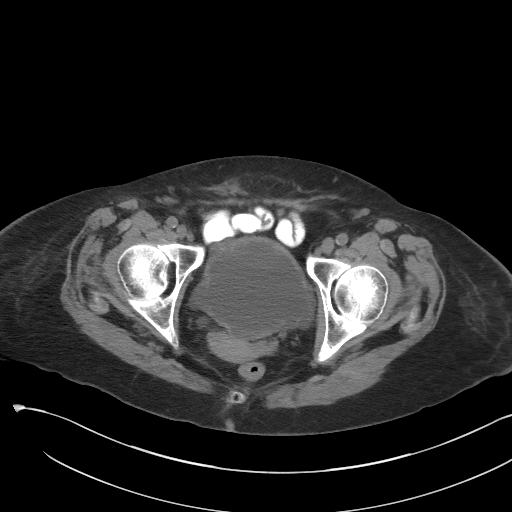
[im 20/90  soft-tissue]
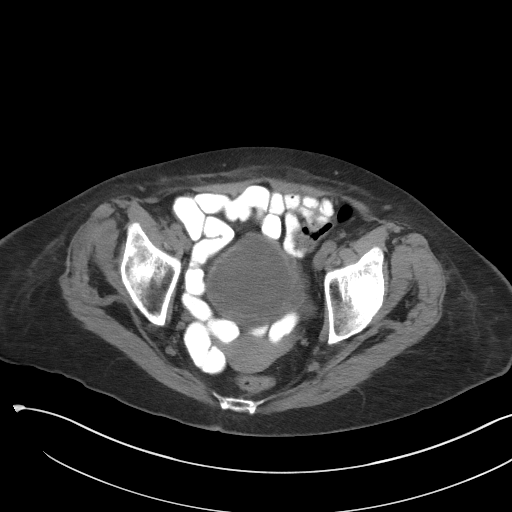
[im 25/90  soft-tissue]
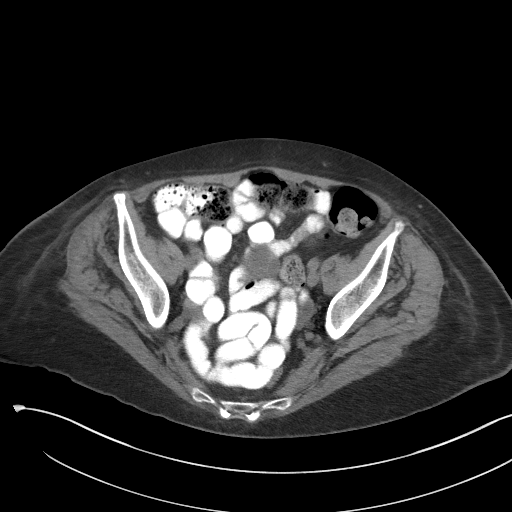
[im 35/90  soft-tissue]
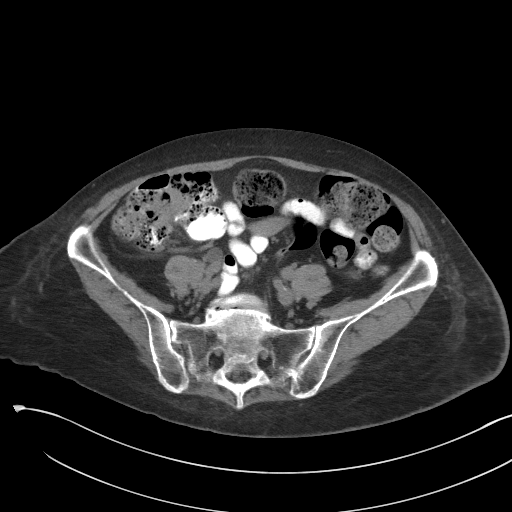
[im 40/90  soft-tissue]
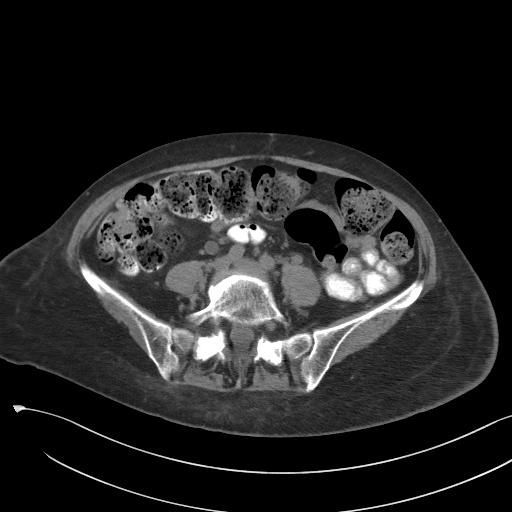
[im 50/90  soft-tissue]
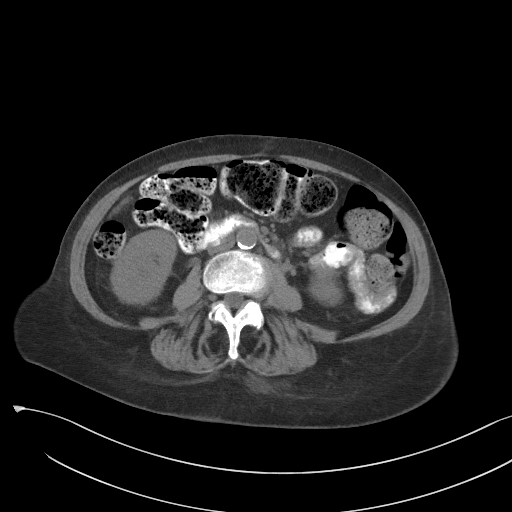
[im 55/90  soft-tissue]
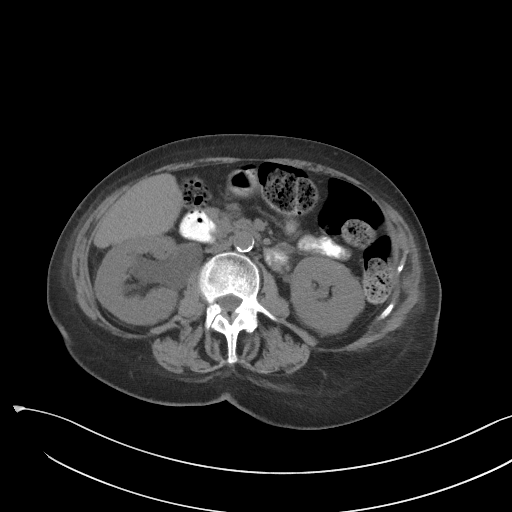
[im 65/90  soft-tissue]
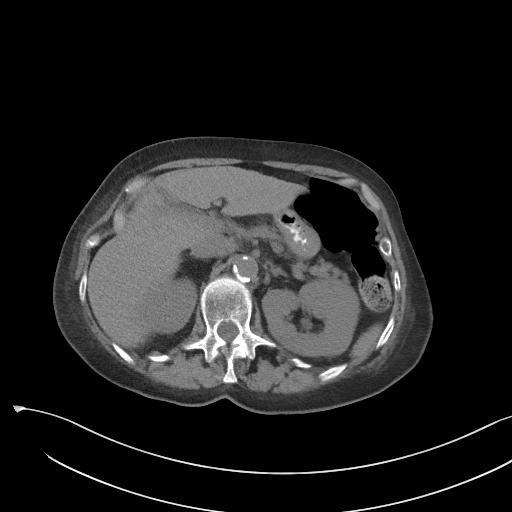
[im 65/90  bone]
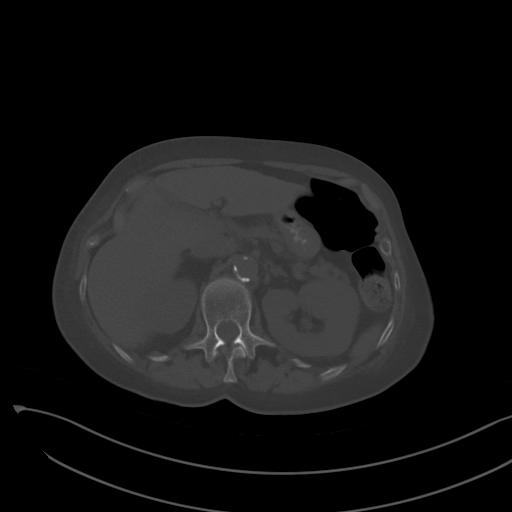
[im 70/90  soft-tissue]
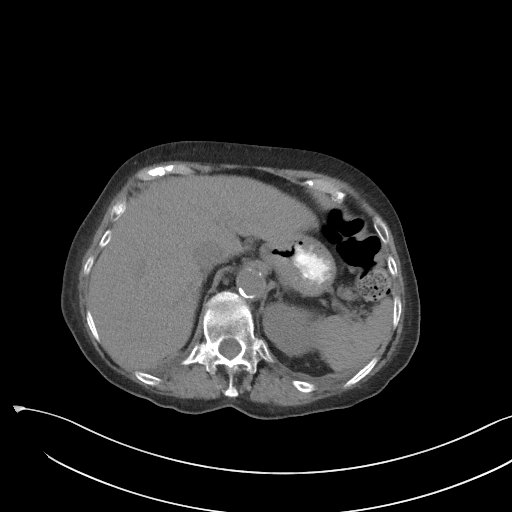
[im 75/90  soft-tissue]
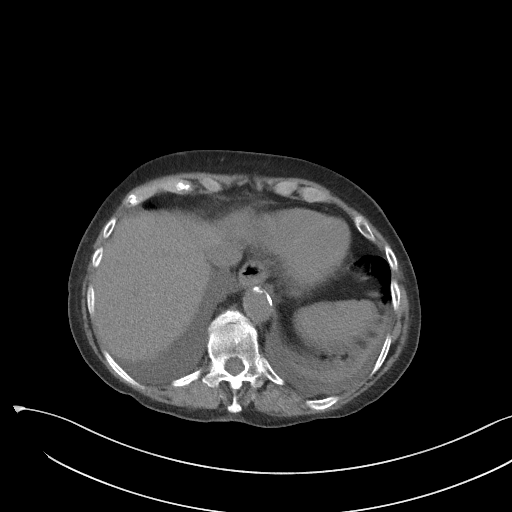
[im 85/90  soft-tissue]
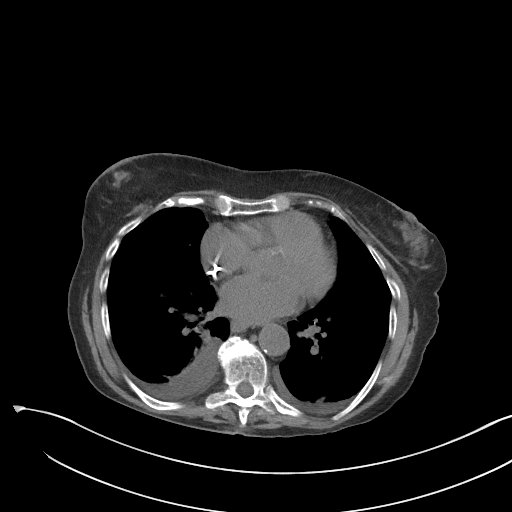

[Series 5: coronal st · coronal · 0.73mm/px · 3 of 101 slices shown]
[im 34/101  soft-tissue]
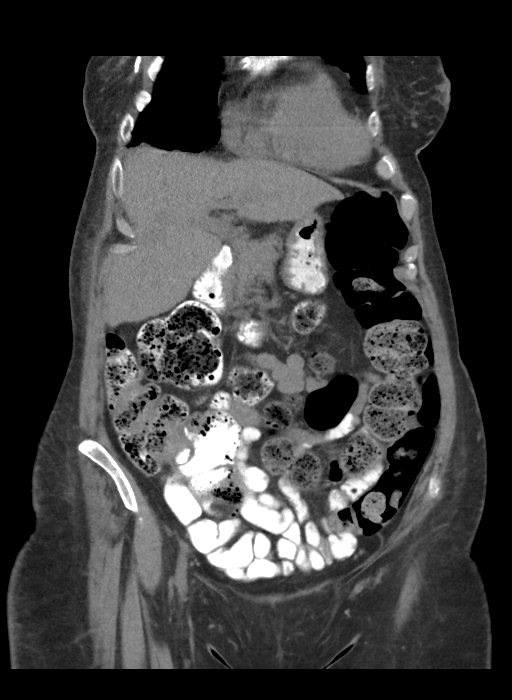
[im 45/101  soft-tissue]
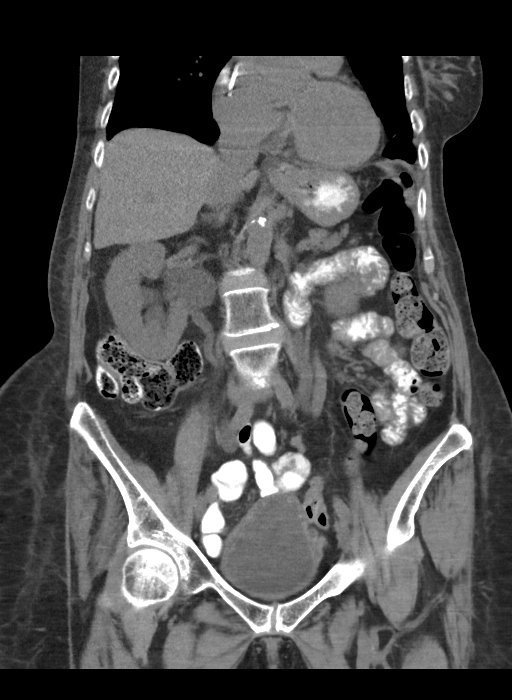
[im 56/101  soft-tissue]
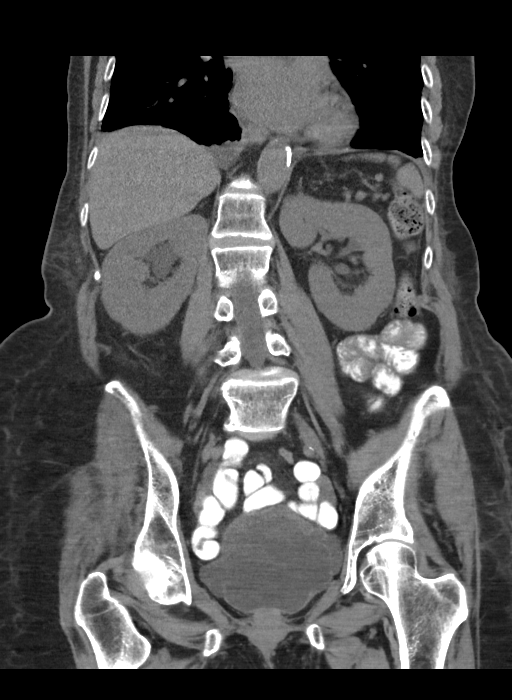

[15 of 46 positions shown; findings below may reference images not displayed]

FINDINGS: Lower chest: Small right-greater-than-left pleural effusions
associated atelectasis, slightly increased compared to prior exam.
New centrilobular nodules of the bilateral lower lobes.Previously
seen lingular nodules are not apparent on today's exam.

Hepatobiliary: Within limitations of exam, no focal liver lesions.
Gallbladder is unremarkable. No biliary ductal dilation.

Pancreas: Unremarkable. No pancreatic ductal dilatation or
surrounding inflammatory changes.

Spleen: Normal in size without focal abnormality.

Adrenals/Urinary Tract: Bilateral adrenal glands are unremarkable.
New moderate right hydronephrosis and hydroureter with no
obstructing stone or mass visualized. Thick-walled urinary bladder
with numerous trabeculations.

Stomach/Bowel: Stomach is within normal limits. Appendix is not
visualized. No evidence of bowel wall thickening, distention, or
inflammatory changes.

Vascular/Lymphatic: Aortic atherosclerosis. No enlarged abdominal or
pelvic lymph nodes.

Reproductive: Uterus and bilateral adnexa are unremarkable.

Other: No abdominal wall hernia or abnormality. No abdominopelvic
ascites.

Musculoskeletal: Previously seen lesion of the posterior left thigh.
Grade 1 anterolisthesis of L4 on L5. No suspicious osseous lesions.
IMPRESSION: No evidence of new metastatic disease in the abdomen or pelvis.

New moderate right hydronephrosis and hydroureter with no
obstructing stone or mass visualized. Thick-walled and trabeculated
urinary bladder suggesting bladder dysfunction.

New centrilobular nodules of the bilateral lower lobes, likely due
to infection or aspiration. Recommend attention on follow-up.

Previously seen lingular nodules are not apparent on today's exam.

Previously seen lesion of the posterior left thigh is not included
in the field of view.

Aortic Atherosclerosis ([VE]-[VE]).

## 2021-07-20 DIAGNOSIS — I82431 Acute embolism and thrombosis of right popliteal vein: Secondary | ICD-10-CM | POA: Diagnosis not present

## 2021-07-20 DIAGNOSIS — R4701 Aphasia: Secondary | ICD-10-CM | POA: Diagnosis not present

## 2021-07-20 DIAGNOSIS — C7931 Secondary malignant neoplasm of brain: Secondary | ICD-10-CM | POA: Diagnosis not present

## 2021-07-20 DIAGNOSIS — I10 Essential (primary) hypertension: Secondary | ICD-10-CM | POA: Diagnosis not present

## 2021-07-20 DIAGNOSIS — G8191 Hemiplegia, unspecified affecting right dominant side: Secondary | ICD-10-CM | POA: Diagnosis not present

## 2021-07-20 DIAGNOSIS — C434 Malignant melanoma of scalp and neck: Secondary | ICD-10-CM | POA: Diagnosis not present

## 2021-07-23 ENCOUNTER — Telehealth: Payer: Self-pay | Admitting: *Deleted

## 2021-07-23 ENCOUNTER — Ambulatory Visit
Admission: RE | Admit: 2021-07-23 | Discharge: 2021-07-23 | Disposition: A | Discharge status: Home / Self Care | Payer: Medicare Other | Source: Ambulatory Visit | Attending: Radiation Oncology | Admitting: Radiation Oncology

## 2021-07-23 DIAGNOSIS — C7931 Secondary malignant neoplasm of brain: Secondary | ICD-10-CM

## 2021-07-23 DIAGNOSIS — Z23 Encounter for immunization: Secondary | ICD-10-CM | POA: Diagnosis not present

## 2021-07-23 DIAGNOSIS — C211 Malignant neoplasm of anal canal: Secondary | ICD-10-CM

## 2021-07-23 DIAGNOSIS — G8191 Hemiplegia, unspecified affecting right dominant side: Secondary | ICD-10-CM

## 2021-07-23 NOTE — Telephone Encounter (Signed)
Patients son called.  He states that treatment was suspended due to elevated labs results AST/ALT and was rechecked once again with additional increase in AST/ALT.    Patient had CT Abdomen Pelvis and son has read the results but would like to talk with MD if at all possible on plan of action.  Routed to Dr Alen Blew

## 2021-07-23 NOTE — Progress Notes (Signed)
  Radiation Oncology         [phone] ________________________________  Name: ROSHELL Carpenter MRN: [id number]  Date of Service: 07/23/2021  DOB: [birth year]  Post Treatment Telephone Note  Diagnosis:   Persistent metastatic melanoma to the brain  Interval Since Last Radiation:  5 weeks   06/12/2021 through 06/22/2021  SRS Re-Treatment Site Technique Total Dose (Gy) Dose per Fx (Gy) Completed Fx Beam Energies  Brain:  [id number] IMRT 25/25 5 5/5 [id number]    Narrative:  The patient was contacted today for routine follow-up. During treatment she did very well with radiotherapy and did not have significant desquamation.    Impression/Plan: 1. Persistent metastatic melanoma to the brain. I was unable to reach the patient but left a voicemail and on the message encouraged she and her son to call us back if they had concerns about treatment. We would be happy to continue to follow her as needed, but she will also continue to follow up with Dr. Alen Blew in medical oncology and Dr. Mickeal Skinner in Neuro Oncology through the brain oncology conference.      Carola Rhine, PAC

## 2021-07-24 ENCOUNTER — Other Ambulatory Visit: Payer: Self-pay | Admitting: Oncology

## 2021-07-24 DIAGNOSIS — C211 Malignant neoplasm of anal canal: Secondary | ICD-10-CM

## 2021-07-24 DIAGNOSIS — I82431 Acute embolism and thrombosis of right popliteal vein: Secondary | ICD-10-CM | POA: Diagnosis not present

## 2021-07-24 DIAGNOSIS — C7931 Secondary malignant neoplasm of brain: Secondary | ICD-10-CM | POA: Diagnosis not present

## 2021-07-24 DIAGNOSIS — R7989 Other specified abnormal findings of blood chemistry: Secondary | ICD-10-CM

## 2021-07-24 DIAGNOSIS — I10 Essential (primary) hypertension: Secondary | ICD-10-CM | POA: Diagnosis not present

## 2021-07-24 DIAGNOSIS — C434 Malignant melanoma of scalp and neck: Secondary | ICD-10-CM | POA: Diagnosis not present

## 2021-07-24 DIAGNOSIS — R4701 Aphasia: Secondary | ICD-10-CM | POA: Diagnosis not present

## 2021-07-24 DIAGNOSIS — G8191 Hemiplegia, unspecified affecting right dominant side: Secondary | ICD-10-CM | POA: Diagnosis not present

## 2021-07-25 ENCOUNTER — Telehealth: Payer: Self-pay | Admitting: *Deleted

## 2021-07-25 ENCOUNTER — Other Ambulatory Visit: Payer: Self-pay

## 2021-07-25 ENCOUNTER — Inpatient Hospital Stay: Payer: Medicare Other

## 2021-07-25 ENCOUNTER — Telehealth: Payer: Self-pay | Admitting: Oncology

## 2021-07-25 DIAGNOSIS — I10 Essential (primary) hypertension: Secondary | ICD-10-CM | POA: Diagnosis not present

## 2021-07-25 DIAGNOSIS — C211 Malignant neoplasm of anal canal: Secondary | ICD-10-CM

## 2021-07-25 DIAGNOSIS — Z95828 Presence of other vascular implants and grafts: Secondary | ICD-10-CM

## 2021-07-25 DIAGNOSIS — G8191 Hemiplegia, unspecified affecting right dominant side: Secondary | ICD-10-CM | POA: Diagnosis not present

## 2021-07-25 DIAGNOSIS — C434 Malignant melanoma of scalp and neck: Secondary | ICD-10-CM | POA: Diagnosis not present

## 2021-07-25 DIAGNOSIS — I82431 Acute embolism and thrombosis of right popliteal vein: Secondary | ICD-10-CM | POA: Diagnosis not present

## 2021-07-25 DIAGNOSIS — E039 Hypothyroidism, unspecified: Secondary | ICD-10-CM

## 2021-07-25 DIAGNOSIS — R4701 Aphasia: Secondary | ICD-10-CM | POA: Diagnosis not present

## 2021-07-25 DIAGNOSIS — C7931 Secondary malignant neoplasm of brain: Secondary | ICD-10-CM | POA: Diagnosis not present

## 2021-07-25 LAB — CBC WITH DIFFERENTIAL (CANCER CENTER ONLY)
Abs Immature Granulocytes: 0.21 10*3/uL — ABNORMAL HIGH (ref 0.00–0.07)
Basophils Absolute: 0.1 10*3/uL (ref 0.0–0.1)
Basophils Relative: 1 %
Eosinophils Absolute: 0.1 10*3/uL (ref 0.0–0.5)
Eosinophils Relative: 1 %
HCT: 28.9 % — ABNORMAL LOW (ref 36.0–46.0)
Hemoglobin: 9.5 g/dL — ABNORMAL LOW (ref 12.0–15.0)
Immature Granulocytes: 2 %
Lymphocytes Relative: 7 %
Lymphs Abs: 0.9 10*3/uL (ref 0.7–4.0)
MCH: 30.7 pg (ref 26.0–34.0)
MCHC: 32.9 g/dL (ref 30.0–36.0)
MCV: 93.5 fL (ref 80.0–100.0)
Monocytes Absolute: 0.7 10*3/uL (ref 0.1–1.0)
Monocytes Relative: 6 %
Neutro Abs: 10.7 10*3/uL — ABNORMAL HIGH (ref 1.7–7.7)
Neutrophils Relative %: 83 %
Platelet Count: 295 10*3/uL (ref 150–400)
RBC: 3.09 MIL/uL — ABNORMAL LOW (ref 3.87–5.11)
RDW: 17.2 % — ABNORMAL HIGH (ref 11.5–15.5)
WBC Count: 12.7 10*3/uL — ABNORMAL HIGH (ref 4.0–10.5)
nRBC: 0 % (ref 0.0–0.2)

## 2021-07-25 LAB — CMP (CANCER CENTER ONLY)
ALT: 1023 U/L (ref 0–44)
AST: 712 U/L (ref 15–41)
Albumin: 2.7 g/dL — ABNORMAL LOW (ref 3.5–5.0)
Alkaline Phosphatase: 966 U/L — ABNORMAL HIGH (ref 38–126)
Anion gap: 11 (ref 5–15)
BUN: 23 mg/dL (ref 8–23)
CO2: 19 mmol/L — ABNORMAL LOW (ref 22–32)
Calcium: 8.5 mg/dL — ABNORMAL LOW (ref 8.9–10.3)
Chloride: 107 mmol/L (ref 98–111)
Creatinine: 1.46 mg/dL — ABNORMAL HIGH (ref 0.44–1.00)
GFR, Estimated: 37 mL/min — ABNORMAL LOW (ref >60)
Glucose, Bld: 243 mg/dL — ABNORMAL HIGH (ref 70–99)
Potassium: 4.1 mmol/L (ref 3.5–5.1)
Sodium: 137 mmol/L (ref 135–145)
Total Bilirubin: 3 mg/dL — ABNORMAL HIGH (ref 0.3–1.2)
Total Protein: 5.7 g/dL — ABNORMAL LOW (ref 6.5–8.1)

## 2021-07-25 LAB — TSH: TSH: 1.258 u[IU]/mL (ref 0.308–3.960)

## 2021-07-25 MED ORDER — HEPARIN SOD (PORK) LOCK FLUSH 100 UNIT/ML IV SOLN
500.0000 [IU] | Freq: Once | INTRAVENOUS | Status: AC
  Administered 2021-07-25: 500 [IU]

## 2021-07-25 MED ORDER — SODIUM CHLORIDE 0.9% FLUSH
10.0000 mL | Freq: Once | INTRAVENOUS | Status: AC
  Administered 2021-07-25: 10 mL

## 2021-07-25 NOTE — Telephone Encounter (Signed)
Scheduled per sch msg. Called and left msg  

## 2021-07-25 NOTE — Telephone Encounter (Signed)
CRITICAL VALUE STICKER  CRITICAL VALUE: AST 712, ALT 1023  RECEIVER (on-site recipient of call): Velna Ochs RN  DATE & TIME NOTIFIED: 07/25/21 @ 1505  MESSENGER (representative from lab): Suanne Marker  MD NOTIFIED: Dr. Alen Blew  TIME OF NOTIFICATION: 1508  RESPONSE:  MD aware

## 2021-07-26 ENCOUNTER — Inpatient Hospital Stay (HOSPITAL_COMMUNITY)
Admission: EM | Admit: 2021-07-26 | Discharge: 2021-07-28 | DRG: 442 | Disposition: A | Discharge status: Home Health | Payer: Medicare Other | Attending: Internal Medicine | Admitting: Internal Medicine

## 2021-07-26 ENCOUNTER — Other Ambulatory Visit: Payer: Self-pay | Admitting: Oncology

## 2021-07-26 ENCOUNTER — Telehealth: Payer: Self-pay | Admitting: Gastroenterology

## 2021-07-26 ENCOUNTER — Encounter: Payer: Self-pay | Admitting: Oncology

## 2021-07-26 ENCOUNTER — Encounter (HOSPITAL_COMMUNITY): Payer: Self-pay | Admitting: Emergency Medicine

## 2021-07-26 DIAGNOSIS — Z9841 Cataract extraction status, right eye: Secondary | ICD-10-CM

## 2021-07-26 DIAGNOSIS — M810 Age-related osteoporosis without current pathological fracture: Secondary | ICD-10-CM | POA: Diagnosis present

## 2021-07-26 DIAGNOSIS — N179 Acute kidney failure, unspecified: Secondary | ICD-10-CM | POA: Diagnosis present

## 2021-07-26 DIAGNOSIS — Z853 Personal history of malignant neoplasm of breast: Secondary | ICD-10-CM

## 2021-07-26 DIAGNOSIS — C439 Malignant melanoma of skin, unspecified: Secondary | ICD-10-CM | POA: Diagnosis present

## 2021-07-26 DIAGNOSIS — Z66 Do not resuscitate: Secondary | ICD-10-CM | POA: Diagnosis present

## 2021-07-26 DIAGNOSIS — R4701 Aphasia: Secondary | ICD-10-CM | POA: Diagnosis not present

## 2021-07-26 DIAGNOSIS — R7401 Elevation of levels of liver transaminase levels: Secondary | ICD-10-CM

## 2021-07-26 DIAGNOSIS — N182 Chronic kidney disease, stage 2 (mild): Secondary | ICD-10-CM | POA: Diagnosis present

## 2021-07-26 DIAGNOSIS — E8770 Fluid overload, unspecified: Secondary | ICD-10-CM | POA: Diagnosis present

## 2021-07-26 DIAGNOSIS — J90 Pleural effusion, not elsewhere classified: Secondary | ICD-10-CM | POA: Diagnosis present

## 2021-07-26 DIAGNOSIS — Z8249 Family history of ischemic heart disease and other diseases of the circulatory system: Secondary | ICD-10-CM

## 2021-07-26 DIAGNOSIS — R569 Unspecified convulsions: Secondary | ICD-10-CM | POA: Diagnosis present

## 2021-07-26 DIAGNOSIS — T451X5A Adverse effect of antineoplastic and immunosuppressive drugs, initial encounter: Secondary | ICD-10-CM | POA: Diagnosis present

## 2021-07-26 DIAGNOSIS — I10 Essential (primary) hypertension: Secondary | ICD-10-CM

## 2021-07-26 DIAGNOSIS — Z20822 Contact with and (suspected) exposure to covid-19: Secondary | ICD-10-CM | POA: Diagnosis present

## 2021-07-26 DIAGNOSIS — N1330 Unspecified hydronephrosis: Secondary | ICD-10-CM | POA: Diagnosis present

## 2021-07-26 DIAGNOSIS — K718 Toxic liver disease with other disorders of liver: Principal | ICD-10-CM | POA: Diagnosis present

## 2021-07-26 DIAGNOSIS — D638 Anemia in other chronic diseases classified elsewhere: Secondary | ICD-10-CM | POA: Diagnosis present

## 2021-07-26 DIAGNOSIS — Z8051 Family history of malignant neoplasm of kidney: Secondary | ICD-10-CM | POA: Diagnosis not present

## 2021-07-26 DIAGNOSIS — G8191 Hemiplegia, unspecified affecting right dominant side: Secondary | ICD-10-CM | POA: Diagnosis not present

## 2021-07-26 DIAGNOSIS — E8809 Other disorders of plasma-protein metabolism, not elsewhere classified: Secondary | ICD-10-CM | POA: Diagnosis present

## 2021-07-26 DIAGNOSIS — Z801 Family history of malignant neoplasm of trachea, bronchus and lung: Secondary | ICD-10-CM

## 2021-07-26 DIAGNOSIS — E7800 Pure hypercholesterolemia, unspecified: Secondary | ICD-10-CM | POA: Diagnosis present

## 2021-07-26 DIAGNOSIS — K759 Inflammatory liver disease, unspecified: Secondary | ICD-10-CM | POA: Diagnosis not present

## 2021-07-26 DIAGNOSIS — Z800 Family history of malignant neoplasm of digestive organs: Secondary | ICD-10-CM

## 2021-07-26 DIAGNOSIS — E7849 Other hyperlipidemia: Secondary | ICD-10-CM | POA: Diagnosis not present

## 2021-07-26 DIAGNOSIS — K719 Toxic liver disease, unspecified: Secondary | ICD-10-CM

## 2021-07-26 DIAGNOSIS — M4186 Other forms of scoliosis, lumbar region: Secondary | ICD-10-CM | POA: Diagnosis not present

## 2021-07-26 DIAGNOSIS — E785 Hyperlipidemia, unspecified: Secondary | ICD-10-CM | POA: Diagnosis present

## 2021-07-26 DIAGNOSIS — I69351 Hemiplegia and hemiparesis following cerebral infarction affecting right dominant side: Secondary | ICD-10-CM | POA: Diagnosis not present

## 2021-07-26 DIAGNOSIS — M47816 Spondylosis without myelopathy or radiculopathy, lumbar region: Secondary | ICD-10-CM | POA: Diagnosis not present

## 2021-07-26 DIAGNOSIS — K5900 Constipation, unspecified: Secondary | ICD-10-CM | POA: Diagnosis present

## 2021-07-26 DIAGNOSIS — Z803 Family history of malignant neoplasm of breast: Secondary | ICD-10-CM | POA: Diagnosis not present

## 2021-07-26 DIAGNOSIS — R17 Unspecified jaundice: Secondary | ICD-10-CM | POA: Diagnosis not present

## 2021-07-26 DIAGNOSIS — C7931 Secondary malignant neoplasm of brain: Secondary | ICD-10-CM | POA: Diagnosis present

## 2021-07-26 DIAGNOSIS — I129 Hypertensive chronic kidney disease with stage 1 through stage 4 chronic kidney disease, or unspecified chronic kidney disease: Secondary | ICD-10-CM | POA: Diagnosis present

## 2021-07-26 DIAGNOSIS — Y929 Unspecified place or not applicable: Secondary | ICD-10-CM | POA: Diagnosis not present

## 2021-07-26 DIAGNOSIS — K5901 Slow transit constipation: Secondary | ICD-10-CM | POA: Diagnosis present

## 2021-07-26 DIAGNOSIS — C434 Malignant melanoma of scalp and neck: Secondary | ICD-10-CM | POA: Diagnosis not present

## 2021-07-26 DIAGNOSIS — I480 Paroxysmal atrial fibrillation: Secondary | ICD-10-CM | POA: Diagnosis present

## 2021-07-26 DIAGNOSIS — I82431 Acute embolism and thrombosis of right popliteal vein: Secondary | ICD-10-CM | POA: Diagnosis not present

## 2021-07-26 DIAGNOSIS — G8190 Hemiplegia, unspecified affecting unspecified side: Secondary | ICD-10-CM

## 2021-07-26 DIAGNOSIS — Z79899 Other long term (current) drug therapy: Secondary | ICD-10-CM

## 2021-07-26 LAB — COMPREHENSIVE METABOLIC PANEL
ALT: 946 U/L — ABNORMAL HIGH (ref 0–44)
AST: 680 U/L — ABNORMAL HIGH (ref 15–41)
Albumin: 3 g/dL — ABNORMAL LOW (ref 3.5–5.0)
Alkaline Phosphatase: 810 U/L — ABNORMAL HIGH (ref 38–126)
Anion gap: 7 (ref 5–15)
BUN: 25 mg/dL — ABNORMAL HIGH (ref 8–23)
CO2: 22 mmol/L (ref 22–32)
Calcium: 8.6 mg/dL — ABNORMAL LOW (ref 8.9–10.3)
Chloride: 107 mmol/L (ref 98–111)
Creatinine, Ser: 1.24 mg/dL — ABNORMAL HIGH (ref 0.44–1.00)
GFR, Estimated: 45 mL/min — ABNORMAL LOW (ref >60)
Glucose, Bld: 175 mg/dL — ABNORMAL HIGH (ref 70–99)
Potassium: 4.2 mmol/L (ref 3.5–5.1)
Sodium: 136 mmol/L (ref 135–145)
Total Bilirubin: 3.1 mg/dL — ABNORMAL HIGH (ref 0.3–1.2)
Total Protein: 5.6 g/dL — ABNORMAL LOW (ref 6.5–8.1)

## 2021-07-26 LAB — CBC WITH DIFFERENTIAL/PLATELET
Abs Immature Granulocytes: 0.17 K/uL — ABNORMAL HIGH (ref 0.00–0.07)
Basophils Absolute: 0.1 K/uL (ref 0.0–0.1)
Basophils Relative: 1 %
Eosinophils Absolute: 0.1 K/uL (ref 0.0–0.5)
Eosinophils Relative: 1 %
HCT: 29.1 % — ABNORMAL LOW (ref 36.0–46.0)
Hemoglobin: 9.5 g/dL — ABNORMAL LOW (ref 12.0–15.0)
Immature Granulocytes: 1 %
Lymphocytes Relative: 8 %
Lymphs Abs: 1 K/uL (ref 0.7–4.0)
MCH: 31.5 pg (ref 26.0–34.0)
MCHC: 32.6 g/dL (ref 30.0–36.0)
MCV: 96.4 fL (ref 80.0–100.0)
Monocytes Absolute: 1.1 K/uL — ABNORMAL HIGH (ref 0.1–1.0)
Monocytes Relative: 9 %
Neutro Abs: 9.6 K/uL — ABNORMAL HIGH (ref 1.7–7.7)
Neutrophils Relative %: 80 %
Platelets: 276 K/uL (ref 150–400)
RBC: 3.02 MIL/uL — ABNORMAL LOW (ref 3.87–5.11)
RDW: 17.5 % — ABNORMAL HIGH (ref 11.5–15.5)
WBC: 12 K/uL — ABNORMAL HIGH (ref 4.0–10.5)
nRBC: 0 % (ref 0.0–0.2)

## 2021-07-26 LAB — RESP PANEL BY RT-PCR (FLU A&B, COVID) ARPGX2
Influenza A by PCR: NEGATIVE
Influenza B by PCR: NEGATIVE
SARS Coronavirus 2 by RT PCR: NEGATIVE

## 2021-07-26 LAB — PROTIME-INR
INR: 1.2 (ref 0.8–1.2)
Prothrombin Time: 15 seconds (ref 11.4–15.2)

## 2021-07-26 LAB — LIPASE, BLOOD: Lipase: 29 U/L (ref 11–51)

## 2021-07-26 MED ORDER — ENOXAPARIN SODIUM 40 MG/0.4ML IJ SOSY
40.0000 mg | PREFILLED_SYRINGE | INTRAMUSCULAR | Status: DC
  Administered 2021-07-26 – 2021-07-27 (×2): 40 mg via SUBCUTANEOUS
  Filled 2021-07-26 (×2): qty 0.4

## 2021-07-26 MED ORDER — METHYLPREDNISOLONE SODIUM SUCC 125 MG IJ SOLR
70.0000 mg | INTRAMUSCULAR | Status: DC
  Administered 2021-07-26 – 2021-07-27 (×2): 70 mg via INTRAVENOUS
  Filled 2021-07-26 (×2): qty 2

## 2021-07-26 MED ORDER — CAMPHOR-MENTHOL 0.5-0.5 % EX LOTN
1.0000 "application " | TOPICAL_LOTION | Freq: Every day | CUTANEOUS | Status: DC | PRN
  Filled 2021-07-26: qty 222

## 2021-07-26 MED ORDER — TIMOLOL MALEATE 0.5 % OP SOLN
1.0000 [drp] | Freq: Two times a day (BID) | OPHTHALMIC | Status: DC
  Administered 2021-07-26 – 2021-07-28 (×2): 1 [drp] via OPHTHALMIC
  Filled 2021-07-26: qty 5

## 2021-07-26 MED ORDER — BISACODYL 10 MG RE SUPP: 10.0000 mg | Freq: Every day | RECTAL | Status: DC | PRN

## 2021-07-26 MED ORDER — HYDROXYZINE HCL 25 MG PO TABS
25.0000 mg | ORAL_TABLET | Freq: Three times a day (TID) | ORAL | Status: DC
  Administered 2021-07-26 – 2021-07-28 (×5): 25 mg via ORAL
  Filled 2021-07-26 (×5): qty 1

## 2021-07-26 MED ORDER — POLYETHYLENE GLYCOL 3350 17 G PO PACK
17.0000 g | PACK | Freq: Two times a day (BID) | ORAL | Status: DC
  Administered 2021-07-26 – 2021-07-28 (×2): 17 g via ORAL
  Filled 2021-07-26 (×3): qty 1

## 2021-07-26 MED ORDER — SENNOSIDES-DOCUSATE SODIUM 8.6-50 MG PO TABS
1.0000 | ORAL_TABLET | Freq: Two times a day (BID) | ORAL | Status: DC
  Administered 2021-07-26 – 2021-07-28 (×2): 1 via ORAL
  Filled 2021-07-26 (×3): qty 1

## 2021-07-26 MED ORDER — ACYCLOVIR 400 MG PO TABS
400.0000 mg | ORAL_TABLET | Freq: Two times a day (BID) | ORAL | Status: DC
  Administered 2021-07-27: 400 mg via ORAL
  Filled 2021-07-26 (×2): qty 1

## 2021-07-26 MED ORDER — LEVETIRACETAM 500 MG PO TABS
750.0000 mg | ORAL_TABLET | Freq: Two times a day (BID) | ORAL | Status: DC
  Administered 2021-07-26 – 2021-07-27 (×2): 750 mg via ORAL
  Filled 2021-07-26 (×3): qty 1

## 2021-07-26 MED ORDER — LACTATED RINGERS IV SOLN: INTRAVENOUS | Status: DC

## 2021-07-26 MED ORDER — BISACODYL 10 MG RE SUPP
10.0000 mg | Freq: Once | RECTAL | Status: AC
  Administered 2021-07-26: 10 mg via RECTAL
  Filled 2021-07-26: qty 1

## 2021-07-26 MED ORDER — PANTOPRAZOLE SODIUM 40 MG PO TBEC
40.0000 mg | DELAYED_RELEASE_TABLET | Freq: Every day | ORAL | Status: DC
  Administered 2021-07-26 – 2021-07-27 (×2): 40 mg via ORAL
  Filled 2021-07-26 (×2): qty 1

## 2021-07-26 MED ORDER — TAMSULOSIN HCL 0.4 MG PO CAPS
0.4000 mg | ORAL_CAPSULE | Freq: Every day | ORAL | Status: DC
  Administered 2021-07-26 – 2021-07-27 (×2): 0.4 mg via ORAL
  Filled 2021-07-26 (×2): qty 1

## 2021-07-26 MED ORDER — LORATADINE 10 MG PO TABS
10.0000 mg | ORAL_TABLET | Freq: Every day | ORAL | Status: DC
  Administered 2021-07-27 – 2021-07-28 (×2): 10 mg via ORAL
  Filled 2021-07-26 (×2): qty 1

## 2021-07-26 MED ORDER — METOPROLOL SUCCINATE ER 25 MG PO TB24
25.0000 mg | ORAL_TABLET | Freq: Two times a day (BID) | ORAL | Status: DC
  Administered 2021-07-26: 25 mg via ORAL
  Filled 2021-07-26: qty 1

## 2021-07-26 MED ORDER — BETHANECHOL CHLORIDE 25 MG PO TABS
50.0000 mg | ORAL_TABLET | Freq: Three times a day (TID) | ORAL | Status: DC
  Administered 2021-07-26 – 2021-07-28 (×5): 50 mg via ORAL
  Filled 2021-07-26 (×7): qty 2

## 2021-07-26 MED ORDER — VITAMIN D3 25 MCG (1000 UNIT) PO TABS
5000.0000 [IU] | ORAL_TABLET | Freq: Every day | ORAL | Status: DC
  Administered 2021-07-27 – 2021-07-28 (×2): 5000 [IU] via ORAL
  Filled 2021-07-26 (×2): qty 5

## 2021-07-26 NOTE — ED Triage Notes (Signed)
Pts son (POV) reports elevated liver enzymes. Reports worsening jaundice and constipation over the past week. Denies any acute sx today. Hx of anal cancer with mets to brain. Recent abd CT which was unremarkable

## 2021-07-26 NOTE — ED Provider Notes (Signed)
[facility] DEPT Provider Note   CSN: [id number] Arrival date & time: 07/26/21  1441     History Chief Complaint  Patient presents with   Abnormal Labs   Constipation    Angela Carpenter is a 80 y.o. female.  80 year old female presents with increased liver function test.  Patient saw her oncologist today for follow-up and she does have a history of metastatic cancer to her brain.  Was noted to have increased jaundice but no confusion.  No signs of hepatic encephalopathy.  Concern was that she may be developing autoimmune hepatitis.  He sent her here to be admitted for high-dose IV steroids as well as otitis work-up.  She herself denies any new weakness in fact states that she felt stronger.  Has had no fever or chills.  No vomiting or diarrhea.  Has had some constipation      Past Medical History:  Diagnosis Date   A-fib [facility])    Atrial fibrillation ([place])    4 years ago started   Brain cancer ([place]) 12/2020   Breast cancer ([place]) 2006   Colon polyps    Glaucoma    Hypercholesteremia    Hypertension    patient denies    Osteoporosis    Personal history of chemotherapy 2006   Personal history of radiation therapy 2006   Stroke (cerebrum) [facility]) 2015    Patient Active Problem List   Diagnosis Date Noted   Port-A-Cath in place 06/21/2021   Benign essential HTN    Urinary retention    Slow transit constipation    Leucocytosis    Metastatic cancer to brain ([place]) 05/03/2021   Right hemiplegia ([place]) 05/03/2021   Hemiparesis ([place])    Metastasis to brain ([place]) 12/12/2020   Seizure disorder, focal motor ([place]) 12/12/2020   ICH (intracerebral hemorrhage) ([place]) 12/11/2020   Encounter for antineoplastic immunotherapy 06/30/2020   Malignant melanoma of anal canal ([place]) 07/21/2019   Hypertensive heart disease 12/04/2016   HX: anticoagulation 12/04/2016   PAF (paroxysmal atrial fibrillation) ([place]) 08/29/2015   Hyperlipidemia 08/29/2015   Atrial  fibrillation ([place]) 08/28/2015    Past Surgical History:  Procedure Laterality Date   APPLICATION OF CRANIAL NAVIGATION Left 05/01/2021   Procedure: APPLICATION OF CRANIAL NAVIGATION;  Surgeon: Consuella Lose, MD;  Location: [facility];  Service: Neurosurgery;  Laterality: Left;   AUGMENTATION MAMMAPLASTY Bilateral 2006   Patient had them removed in 2008   BREAST BIOPSY Left 2016   BREAST LUMPECTOMY Right 2006   COLONOSCOPY     had polyps   CRANIOTOMY Left 05/01/2021   Procedure: STEREOTACTIC FRONTAL CRANIOTOMY TUMOR EXCISION;  Surgeon: Consuella Lose, MD;  Location: [facility];  Service: Neurosurgery;  Laterality: Left;   EYE SURGERY Right    cataract   IR IMAGING GUIDED PORT INSERTION  06/06/2021   IR US GUIDE VASC ACCESS LEFT  06/06/2021   MELANOMA EXCISION WITH SENTINEL LYMPH NODE BIOPSY N/A 06/02/2019   Procedure: Left lateral ANAL MELANOMA EXCISION WITH Bilateral Groin exploration and SENTINEL node mapping;  Surgeon: [name], MD;  Location: [facility];  Service: General;  Laterality: N/A;   melanoma removal Left    crown   TONSILLECTOMY       OB History   No obstetric history on file.     Family History  Problem Relation Age of Onset   Lung cancer Mother    Hypotension Mother    Pancreatic cancer Father    Hypertension Sister    Kidney  cancer Brother    Cirrhosis Maternal Grandfather    Breast cancer Paternal Grandmother    Cirrhosis Paternal Grandfather    Breast cancer Paternal Aunt     Social History   Tobacco Use   Smoking status: Never    Passive exposure: Yes   Smokeless tobacco: Never   Tobacco comments:    smoked in college but didn't inhale  Vaping Use   Vaping Use: Never used  Substance Use Topics   Alcohol use: No    Alcohol/week: 0.0 standard drinks   Drug use: No    Home Medications Prior to Admission medications   Medication Sig Start Date End Date Taking? Authorizing Provider  acyclovir (ZOVIRAX) 400 MG tablet Take 1 tablet (400 mg  total) by mouth 2 (two) times daily. 06/13/21   [name], MD  atorvastatin (LIPITOR) 10 MG tablet Take 1 tablet (10 mg total) by mouth daily. 06/13/21   [name], MD  bethanechol (URECHOLINE) 50 MG tablet Take 1 tablet (50 mg total) by mouth 3 (three) times daily. 06/13/21   [name], MD  camphor-menthol [facility]) lotion Apply topically as needed for itching. 05/29/21   Love, Ivan Anchors, PA-C  Cholecalciferol (VITAMIN D-3) 125 MCG (5000 UT) TABS Take 5,000 Units by mouth daily. 05/29/21   [name], MD  dexamethasone (DECADRON) 1 MG tablet Take 1 tablet (1 mg total) by mouth 2 (two) times daily with a meal. 07/12/21   [name], MD  levETIRAcetam (KEPPRA) 750 MG tablet Take 1 tablet (750 mg total) by mouth 2 (two) times daily. 06/13/21   [name], MD  lidocaine-prilocaine (EMLA) cream Apply 1 application topically as needed. 06/04/21   [name], MD  metoprolol succinate (TOPROL-XL) 25 MG 24 hr tablet Take 1 tablet (25 mg total) by mouth 2 (two) times daily. 06/13/21   [name], MD  Multiple Vitamins-Minerals (PRESERVISION AREDS 2+MULTI VIT) CAPS Take 1 capsule by mouth 2 (two) times daily.    [provider]  nutrition supplement, JUVEN, (JUVEN) PACK Take 1 packet by mouth 2 (two) times daily between meals. Patient not taking: Reported on 07/03/2021 05/29/21   Love, Ivan Anchors, PA-C  ondansetron (ZOFRAN) 4 MG tablet Take 1 tablet (4 mg total) by mouth every 4 (four) hours as needed for nausea or vomiting. 06/13/21   [name], MD  pantoprazole (PROTONIX) 40 MG tablet Take 1 tablet (40 mg total) by mouth at bedtime. 06/13/21   [name], MD  polyethylene glycol (MIRALAX / GLYCOLAX) 17 g packet Take 17 g by mouth 2 (two) times daily. Patient not taking: Reported on 07/03/2021 05/29/21   Bary Leriche, PA-C  raloxifene (EVISTA) 60 MG tablet Take 1 tablet (60 mg total) by mouth in the morning. 06/13/21   [name], MD  senna-docusate  (SENOKOT-S) 8.6-50 MG tablet Take 1 tablet by mouth 2 (two) times daily. 05/29/21   Love, Ivan Anchors, PA-C  tamsulosin (FLOMAX) 0.4 MG CAPS capsule Take 1 capsule (0.4 mg total) by mouth daily after supper. 06/13/21   [name], MD  timolol (TIMOPTIC) 0.5 % ophthalmic solution Place 1 drop into both eyes 2 (two) times daily. 06/13/21   [name], MD  triamcinolone ointment (KENALOG) 0.5 % Apply 1 application topically 2 (two) times daily. 07/18/21   [name], MD    Allergies    Patient has no known allergies.  Review of Systems  Review of Systems  All other systems reviewed and are negative.  Physical Exam Updated Vital Signs BP 133/71   Pulse 73   Temp 98 F (36.7 C) (Oral)   Resp 18   SpO2 99%   Physical Exam Vitals and nursing note reviewed.  Constitutional:      General: She is not in acute distress.    Appearance: Normal appearance. She is well-developed. She is not toxic-appearing.  HENT:     Head: Normocephalic and atraumatic.  Eyes:     General: Lids are normal. Scleral icterus present.     Conjunctiva/sclera: Conjunctivae normal.     Pupils: Pupils are equal, round, and reactive to light.  Neck:     Thyroid: No thyroid mass.     Trachea: No tracheal deviation.  Cardiovascular:     Rate and Rhythm: Normal rate and regular rhythm.     Heart sounds: Normal heart sounds. No murmur heard.   No gallop.  Pulmonary:     Effort: Pulmonary effort is normal. No respiratory distress.     Breath sounds: Normal breath sounds. No stridor. No decreased breath sounds, wheezing, rhonchi or rales.  Abdominal:     General: There is no distension.     Palpations: Abdomen is soft.     Tenderness: There is no abdominal tenderness. There is no rebound.  Musculoskeletal:        General: No tenderness. Normal range of motion.     Cervical back: Normal range of motion and neck supple.  Skin:    General: Skin is warm and dry.     Findings: No abrasion or rash.   Neurological:     Mental Status: She is alert and oriented to person, place, and time. Mental status is at baseline.     GCS: GCS eye subscore is 4. GCS verbal subscore is 5. GCS motor subscore is 6.     Cranial Nerves: No cranial nerve deficit.     Sensory: No sensory deficit.     Motor: Motor function is intact.  Psychiatric:        Attention and Perception: Attention normal.        Speech: Speech normal.        Behavior: Behavior normal.    ED Results / Procedures / Treatments   Labs (all labs ordered are listed, but only abnormal results are displayed) Labs Reviewed  RESP PANEL BY RT-PCR (FLU A&B, COVID) ARPGX2  COMPREHENSIVE METABOLIC PANEL  CBC WITH DIFFERENTIAL/PLATELET  LIPASE, BLOOD  HEPATITIS PANEL, ACUTE  PROTIME-INR    EKG None  Radiology No results found.  Procedures Procedures   Medications Ordered in ED Medications  lactated ringers infusion (has no administration in time range)    ED Course  I have reviewed the triage vital signs and the nursing notes.  Pertinent labs & imaging results that were available during my care of the patient were reviewed by me and considered in my medical decision making (see chart for details).    MDM Rules/Calculators/A&P                           Plan will be to give IV hydration here and admit to the hospitalist service. Final Clinical Impression(s) / ED Diagnoses Final diagnoses:  None    Rx / DC Orders ED Discharge Orders     None        Lacretia Leigh, MD 07/26/21 1558

## 2021-07-26 NOTE — ED Provider Notes (Signed)
Emergency Medicine Provider Triage Evaluation Note  Angela Carpenter , a 80 y.o. female  was evaluated in triage.  Pt complains of elevated liver enzymes.  Patient had lab work yesterday which showed increased AST and ALT, patient was advised to come to the emergency department.  Patient has history of anal cancer with mets to brain.  Patient completed radiation 2 to 3 weeks prior.  Patient is on at bedside reports that she had CT of abdomen pelvis last week which showed no metastasis to abdomen.  Review of Systems  Positive: Elevated liver enzymes, jaundice Negative: Nausea, vomiting, fevers, chills, constipation, diarrhea  Physical Exam  BP (!) 117/54 (BP Location: Left Arm)   Pulse 77   Temp 98 F (36.7 C) (Oral)   Resp 18   SpO2 98%  Gen:   Awake, no distress   Resp:  Normal effort  MSK:   Moves extremities without difficulty  Other:  Abdomen soft, nondistended, nontender.  Medical Decision Making  Medically screening exam initiated at 3:15 PM.  Appropriate orders placed.  AHLANI WICKES was informed that the remainder of the evaluation will be completed by another provider, this initial triage assessment does not replace that evaluation, and the importance of remaining in the ED until their evaluation is complete.  Elevated liver enzymes.   Loni Beckwith, PA-C 07/26/21 1517    Drenda Freeze, MD 07/26/21 [phone]

## 2021-07-26 NOTE — Progress Notes (Signed)
The results of her liver function test were reviewed and discussed today with the patient and her son.  He is becoming more symptomatic with jaundice, fatigue and constipation.  He denies any confusion or altered mental status.  She is currently on dexamethasone 2 mg total dose.  The etiology of her hepatic failure remains unclear at this time.  Imaging studies did not show any evidence of blockage.  The pattern although might not be consistent with autoimmune hepatitis but given her recent exposure to immunotherapy it has to be considered and treated.  I am in favor of starting her on intravenous steroids with equivalent to 1 mg/kg methylprednisolone daily and obtaining urgent GI pathology evaluation.  She will likely require acute hepatitis work-up and may be a biopsy.  To expedite her work-up by recommended urgent referral to the emergency department given these findings.  All questions were answered to their satisfaction.  We will continue to follow her progress and patient if she is hospitalized and closely outpatient upon discharge.

## 2021-07-26 NOTE — Telephone Encounter (Signed)
Spoke with Angela Carpenter, patient has been scheduled for a new patient appt with Dr. Loletha Carrow on Monday, 08/06/21 at 3:40 PM. Angela Carpenter verbalized understanding and had no concerns at the end of the call.

## 2021-07-26 NOTE — Telephone Encounter (Signed)
Inbound call from patient's son wanting to schedule an appt in regards to the referral we received but next availability is not until 08/14/2021.  He is requesting for patient to be seen sooner if possible since she cannot continue her autoimmune treatment.  Please advise.

## 2021-07-26 NOTE — H&P (Signed)
History and Physical    Angela Carpenter:[id number] DOB: 02-01-1941 DOA: 07/26/2021  PCP: [name], MD   I have briefly reviewed patients previous medical reports in [facility].  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 fib, dyslipidemia, hypertension, osteoporosis, CVA with right hemiparesis, breast cancer, presented to the [facility] ED on 07/26/2021 upon the advice of Dr. Alen Blew, her oncologist, due to jaundice and progressively worsening liver function test for further evaluation and management.  Patient and son at bedside provided history.  Son first noted scleral icterus/jaundice about 3 days PTA.  This is also associated with constipation and last BM was 3 days ago.  Prior to that patient had normal colored stools.  Constipation is not new for her.  She has some itching which appears chronic and preceding the jaundice.  Also the itching is actually getting better.  Otherwise denies any GI symptoms.  No nausea, vomiting.  Appetite is good.  Patient feels that she has actually gained weight.  Unsure if she has any abdominal distention but feels that it is due to the constipation.  Passing flatus.  No abdominal pain.  Denies any new OTC or prescription medicines.  Per son, patient had her last immunotherapy on 06/21/2021.  When she was supposed to get her next cycle on 07/12/2021, her LFTs were noted to be abnormal and immunotherapy was held.  Lab work was followed up with oncology and were progressively worsening (07/25/2021: AST 712, ALT 1023, total bilirubin 3) and recent CT abdomen and pelvis without contrast 10/13 showed no evidence of new metastatic disease in the abdomen or pelvis but did show new moderate right  hydronephrosis and hydroureter with no obstructing stone or mass.  Dr. Alen Blew did recommend hospitalization, checking for acute viral hepatitis panel, GI consultation and possible biopsy and initiating high-dose IV methylprednisolone.  ED Course: Vital signs stable.  Lab work significant for glucose 175, BUN 25, creatinine 1.24, alkaline phosphatase 810, albumin 3, AST 680, down from 712 yesterday, ALT 946, down from 1023 yesterday, total bilirubin 3.1, WBC 12, hemoglobin 9.6.  Influenza and COVID RT-PCR negative.  Review of Systems:  All other systems reviewed and apart from HPI, are negative.  Past Medical History:  Diagnosis Date   A-fib [facility])    Atrial fibrillation ([place])    4 years ago started   Brain cancer ([place]) 12/2020   Breast cancer ([place]) 2006   Colon polyps    Glaucoma    Hypercholesteremia    Hypertension    patient denies    Osteoporosis    Personal history of chemotherapy 2006   Personal history of radiation therapy 2006   Stroke (cerebrum) ([place]) 2015    Past Surgical History:  Procedure Laterality Date   APPLICATION OF CRANIAL NAVIGATION Left 05/01/2021   Procedure: APPLICATION OF CRANIAL NAVIGATION;  Surgeon: Consuella Lose, MD;  Location: [place];  Service: Neurosurgery;  Laterality: Left;   AUGMENTATION MAMMAPLASTY Bilateral 2006   Patient had them removed in 2008   BREAST BIOPSY Left 2016   BREAST LUMPECTOMY Right 2006   COLONOSCOPY     had polyps   CRANIOTOMY Left 05/01/2021   Procedure: STEREOTACTIC FRONTAL CRANIOTOMY TUMOR EXCISION;  Surgeon: Consuella Lose, MD;  Location: [place];  Service: Neurosurgery;  Laterality: Left;   EYE SURGERY Right    cataract   IR IMAGING GUIDED PORT INSERTION  06/06/2021   IR US GUIDE VASC ACCESS LEFT  06/06/2021   MELANOMA EXCISION WITH SENTINEL LYMPH NODE BIOPSY N/A 06/02/2019   Procedure: Left lateral ANAL MELANOMA EXCISION WITH Bilateral Groin exploration and SENTINEL node mapping;  Surgeon: [name], MD;   Location: [place];  Service: General;  Laterality: N/A;   melanoma removal Left    [place]   TONSILLECTOMY      Social History  reports that she has never smoked. She has been exposed to tobacco smoke. She has never used smokeless tobacco. She reports that she does not drink alcohol and does not use drugs.  No Known Allergies  Family History  Problem Relation Age of Onset   Lung cancer Mother    Hypotension Mother    Pancreatic cancer Father    Hypertension Sister    Kidney cancer Brother    Cirrhosis Maternal Grandfather    Breast cancer Paternal Grandmother    Cirrhosis Paternal Grandfather    Breast cancer Paternal Aunt      Prior to Admission medications   Medication Sig Start Date End Date Taking? Authorizing Provider  acyclovir (ZOVIRAX) 400 MG tablet Take 1 tablet (400 mg total) by mouth 2 (two) times daily. 06/13/21  Yes [name], MD  atorvastatin (LIPITOR) 10 MG tablet Take 1 tablet (10 mg total) by mouth daily. 06/13/21  Yes [name], MD  bethanechol (URECHOLINE) 50 MG tablet Take 1 tablet (50 mg total) by mouth 3 (three) times daily. 06/13/21  Yes [name], MD  camphor-menthol [facility]) lotion Apply topically as needed for itching. Patient taking differently: Apply 1 application topically daily as needed for itching. 05/29/21  Yes Love, Ivan Anchors, PA-C  cetirizine (ZYRTEC) 10 MG tablet Take 10 mg by mouth 2 (two) times daily.   Yes [provider]  Cholecalciferol (VITAMIN D-3) 125 MCG (5000 UT) TABS Take 5,000 Units by mouth daily. 05/29/21  Yes [name], MD  dexamethasone (DECADRON) 1 MG tablet Take 1 tablet (1 mg total) by mouth 2 (two) times daily with a meal. Patient taking differently: Take 2 mg by mouth daily. 07/12/21  Yes [name], MD  hydrOXYzine (ATARAX/VISTARIL) 25 MG tablet Take 25 mg by mouth 3 (three) times daily. 07/18/21  Yes [provider]  levETIRAcetam (KEPPRA) 750 MG tablet Take 1 tablet (750 mg total)  by mouth 2 (two) times daily. 06/13/21  Yes [name], MD  metoprolol succinate (TOPROL-XL) 25 MG 24 hr tablet Take 1 tablet (25 mg total) by mouth 2 (two) times daily. 06/13/21  Yes [name], MD  Multiple Vitamins-Minerals (PRESERVISION AREDS 2+MULTI VIT) CAPS Take 1 capsule by mouth 2 (two) times daily.   Yes [provider]  pantoprazole (PROTONIX) 40 MG tablet Take 1 tablet (40 mg total) by mouth at bedtime. 06/13/21  Yes [name], MD  polyethylene glycol (MIRALAX / GLYCOLAX) 17 g packet Take 17 g by mouth 2 (two) times daily. 05/29/21  Yes Love, Ivan Anchors, PA-C  raloxifene (EVISTA) 60 MG tablet Take 1 tablet (60 mg total) by mouth in the morning. 06/13/21  Yes [name], MD  senna-docusate (SENOKOT-S) 8.6-50 MG tablet Take 1 tablet by mouth 2 (two) times daily. 05/29/21  Yes Love, Ivan Anchors, PA-C  tamsulosin (FLOMAX) 0.4 MG CAPS capsule Take 1 capsule (0.4 mg total)  by mouth daily after supper. 06/13/21  Yes [name], MD  timolol (TIMOPTIC) 0.5 % ophthalmic solution Place 1 drop into both eyes 2 (two) times daily. 06/13/21  Yes [name], MD  lidocaine-prilocaine (EMLA) cream Apply 1 application topically as needed. Patient not taking: No sig reported 06/04/21   [name], MD  nutrition supplement, JUVEN, (JUVEN) PACK Take 1 packet by mouth 2 (two) times daily between meals. Patient not taking: No sig reported 05/29/21   Love, Ivan Anchors, PA-C  ondansetron (ZOFRAN) 4 MG tablet Take 1 tablet (4 mg total) by mouth every 4 (four) hours as needed for nausea or vomiting. Patient not taking: No sig reported 06/13/21   [name], MD  triamcinolone ointment (KENALOG) 0.5 % Apply 1 application topically 2 (two) times daily. Patient not taking: No sig reported 07/18/21   [name], MD    Physical Exam: Vitals:   07/26/21 1453 07/26/21 1530 07/26/21 1623 07/26/21 1743  BP: (!) 117/54 133/71 133/74 125/75  Pulse: 77 73 67 72  Resp: 18  18 18    Temp: 98 F (36.7 C)     TempSrc: Oral     SpO2: 98% 99% 100% 100%      Constitutional: Elderly female, moderately built and nourished, lying comfortably supine in bed without distress. Eyes: PERTLA, lids and conjunctivae normal.  Scleral icterus appreciated. ENMT: Mucous membranes are moist. Posterior pharynx clear of any exudate or lesions. Normal dentition.   Neck: supple, no masses, no thyromegaly Respiratory: Clear to auscultation without wheezing, rhonchi or crackles. No increased work of breathing. Cardiovascular: S1 & S2 heard, regular rate and rhythm. No JVD, murmurs, rubs or clicks. No pedal edema. Abdomen: Obese/does not appear distended.  Soft and nontender.  No organomegaly or masses appreciated.  Normal bowel sounds heard. Musculoskeletal: no clubbing / cyanosis. No joint deformity upper and lower extremities. Good ROM, no contractures. Normal muscle tone.  Patient has bilateral lower extremity stockings up to the knees with 1+ pitting bilateral leg edema. Skin: no rashes, lesions, ulcers. No induration Neurologic: CN 2-12 grossly intact. Sensation intact, DTR normal. Strength 5/5 in all 4 limbs.  Psychiatric: Normal judgment and insight. Alert and oriented x 3. Normal mood.     Labs on Admission: I have personally reviewed following labs and imaging studies  CBC: Recent Labs  Lab 07/25/21 1415 07/26/21 1613  WBC 12.7* 12.0*  NEUTROABS 10.7* 9.6*  HGB 9.5* 9.5*  HCT 28.9* 29.1*  MCV 93.5 96.4  PLT 295 903    Basic Metabolic Panel: Recent Labs  Lab 07/25/21 1415 07/26/21 1613  NA 137 136  K 4.1 4.2  CL 107 107  CO2 19* 22  GLUCOSE 243* 175*  BUN 23 25*  CREATININE 1.46* 1.24*  CALCIUM 8.5* 8.6*    Liver Function Tests: Recent Labs  Lab 07/25/21 1415 07/26/21 1613  AST 712* 680*  ALT 1,023* 946*  ALKPHOS 966* 810*  BILITOT 3.0* 3.1*  PROT 5.7* 5.6*  ALBUMIN 2.7* 3.0*    Urine analysis:    Component Value Date/Time   COLORURINE STRAW  (A) 05/05/2021 0055   APPEARANCEUR CLEAR 05/05/2021 0055   LABSPEC 1.011 05/05/2021 0055   PHURINE 7.0 05/05/2021 0055   GLUCOSEU NEGATIVE 05/05/2021 [place] 05/05/2021 [place] 05/05/2021 [place] 05/05/2021 0055   PROTEINUR NEGATIVE 05/05/2021 0055   NITRITE NEGATIVE 05/05/2021 0055   LEUKOCYTESUR NEGATIVE 05/05/2021 0055  Radiological Exams on Admission: No results found.    Assessment/Plan Principal Problem:   Hepatitis Active Problems:   PAF (paroxysmal atrial fibrillation) (HCC)   Hyperlipidemia   Hemiparesis (HCC)   Metastatic cancer to brain (HCC)   Slow transit constipation   Benign essential HTN     Abnormal LFTs/mixed hepatitis and cholestatic picture: - Unclear etiology.  High index of suspicion for autoimmune hepatitis related to recent immunotherapy. - Sent in by her primary oncologist to the hospital for further evaluation and management. - I have communicated extensively with [facility] on-call who will formally evaluate patient in the morning.  I communicated with Dr. Hilarie Fredrickson who recommended checking acute hepatitis panel, ANA, anti-smooth muscle antibody, antimitochondrial antibody and MRI abdomen with contrast and MRCP.  She also was okay with high-dose IV methylprednisolone (as recommended by Dr. Alen Blew) after above labs were drawn. - Hold statins for now  Constipation: - No clinical concern for bowel obstruction. - Continue prior home dose of MiraLAX, senna.  Dulcolax suppository now and as needed.  Right hydronephrosis and hydroureter: - Noted on CT abdomen 10/13. - Check renal ultrasound in a.m. and could consider urology consultation if persisting. - Continue bethanechol and tamsulosin.  Stage IV mucosal melanoma with CNS involvement - Outpatient follow-up with oncology. - Since patient is going to be on high-dose IV methylprednisolone, holding home dose of oral Decadron which can be resumed  after completion of IV steroid.  CVA with right hemiparesis - Patient not on antiplatelets or anticoagulation, likely related to CNS involvement. - PT and OT evaluation.  Hyperlipidemia - Holding statins due to abnormal LFTs.  Essential hypertension - Continue Toprol-XL  History of seizures - Continue Keppra.  Seizure precautions.  DVT prophylaxis: Lovenox Code Status: DNR, confirmed with patient in the presence of her son/healthcare power of attorney at bedside Family Communication: Patient's son/healthcare power of attorney was present at bedside throughout interview and exam.  Updated care and answered questions Disposition Plan:   Patient is from:  Home  Anticipated DC to:  Home  Anticipated DC date:  3 to 4 days  Anticipated DC barriers: None   Consults called: [facility] Admission status: Medical bed, inpatient  Severity of Illness: The appropriate patient status for this patient is INPATIENT. Inpatient status is judged to be reasonable and necessary in order to provide the required intensity of service to ensure the patient's safety. The patient's presenting symptoms, physical exam findings, and initial radiographic and laboratory data in the context of their chronic comorbidities is felt to place them at high risk for further clinical deterioration. Furthermore, it is not anticipated that the patient will be medically stable for discharge from the hospital within 2 midnights of admission.   * I certify that at the point of admission it is my clinical judgment that the patient will require inpatient hospital care spanning beyond 2 midnights from the point of admission due to high intensity of service, high risk for further deterioration and high frequency of surveillance required.Vernell Leep MD [facility]  To contact the attending provider between 7A-7P or the covering provider during after hours 7P-7A, please log into the web site [email or web address] and access  using universal [place] password for that web site. If you do not have the password, please call the hospital operator.  07/26/2021, 7:00 PM

## 2021-07-26 NOTE — Progress Notes (Signed)
Patient admitted to 1517. Up with one assist to transfer to bed. Alert and oriented x4. Denies pain or discomfort. Oriented to call bell and room. Bed low call bell within reach.

## 2021-07-27 ENCOUNTER — Inpatient Hospital Stay (HOSPITAL_COMMUNITY): Payer: Medicare Other

## 2021-07-27 ENCOUNTER — Other Ambulatory Visit: Payer: Self-pay

## 2021-07-27 DIAGNOSIS — K759 Inflammatory liver disease, unspecified: Secondary | ICD-10-CM

## 2021-07-27 DIAGNOSIS — C7931 Secondary malignant neoplasm of brain: Secondary | ICD-10-CM

## 2021-07-27 DIAGNOSIS — K719 Toxic liver disease, unspecified: Secondary | ICD-10-CM

## 2021-07-27 LAB — HEPATITIS PANEL, ACUTE
HCV Ab: NONREACTIVE
Hep A IgM: NONREACTIVE
Hep B C IgM: NONREACTIVE
Hepatitis B Surface Ag: NONREACTIVE

## 2021-07-27 LAB — CBC
HCT: 29.9 % — ABNORMAL LOW (ref 36.0–46.0)
Hemoglobin: 9.7 g/dL — ABNORMAL LOW (ref 12.0–15.0)
MCH: 31.2 pg (ref 26.0–34.0)
MCHC: 32.4 g/dL (ref 30.0–36.0)
MCV: 96.1 fL (ref 80.0–100.0)
Platelets: 286 10*3/uL (ref 150–400)
RBC: 3.11 MIL/uL — ABNORMAL LOW (ref 3.87–5.11)
RDW: 17.6 % — ABNORMAL HIGH (ref 11.5–15.5)
WBC: 13.7 10*3/uL — ABNORMAL HIGH (ref 4.0–10.5)
nRBC: 0 % (ref 0.0–0.2)

## 2021-07-27 LAB — COMPREHENSIVE METABOLIC PANEL
ALT: 923 U/L — ABNORMAL HIGH (ref 0–44)
AST: 595 U/L — ABNORMAL HIGH (ref 15–41)
Albumin: 2.8 g/dL — ABNORMAL LOW (ref 3.5–5.0)
Alkaline Phosphatase: 775 U/L — ABNORMAL HIGH (ref 38–126)
Anion gap: 9 (ref 5–15)
BUN: 24 mg/dL — ABNORMAL HIGH (ref 8–23)
CO2: 22 mmol/L (ref 22–32)
Calcium: 8.5 mg/dL — ABNORMAL LOW (ref 8.9–10.3)
Chloride: 109 mmol/L (ref 98–111)
Creatinine, Ser: 1.14 mg/dL — ABNORMAL HIGH (ref 0.44–1.00)
GFR, Estimated: 50 mL/min — ABNORMAL LOW (ref >60)
Glucose, Bld: 157 mg/dL — ABNORMAL HIGH (ref 70–99)
Potassium: 3.9 mmol/L (ref 3.5–5.1)
Sodium: 140 mmol/L (ref 135–145)
Total Bilirubin: 3.2 mg/dL — ABNORMAL HIGH (ref 0.3–1.2)
Total Protein: 5.4 g/dL — ABNORMAL LOW (ref 6.5–8.1)

## 2021-07-27 IMAGING — MR MR ABDOMEN WO/W CM MRCP
17 of 21 series · 38 of 48 positions shown · IV contrast (gadavist)
Comparison: [DATE] CT scan

CLINICAL DATA: Metastatic melanoma including intracranial
metastatic disease. Jaundice with progressive worsening liver
function tests, possible autoimmune hepatitis

EXAM:
MRI ABDOMEN WITHOUT AND WITH CONTRAST (INCLUDING MRCP)
TECHNIQUE: Multiplanar multisequence MR imaging of the abdomen was performed
both before and after the administration of intravenous contrast.
Heavily T2-weighted images of the biliary and pancreatic ducts were
obtained, and three-dimensional MRCP images were rendered by post
processing.
CONTRAST:  7mL GADAVIST GADOBUTROL 1 MMOL/ML IV SOLN

[Series 3: T2 fat-sat · axial · 6.0mm · 1.19mm/px · 1 of 41 slices shown]
[im 1/41]
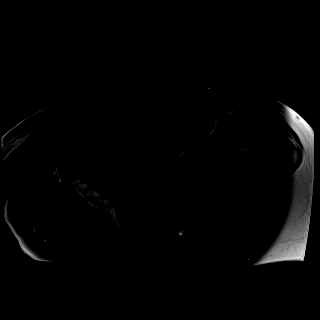

[Series 5: DWI · axial · 6.0mm · 1.42mm/px · z∈[-163,+133]mm · 2 of 80 slices shown (1 of 2)]
[im 1/80]
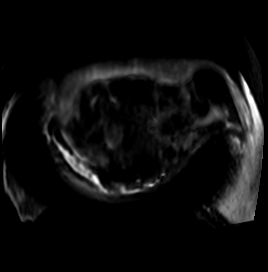
[im 80/80]
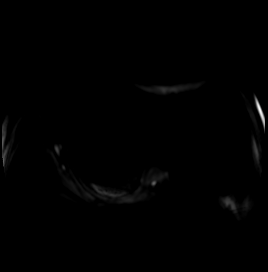

[Series 6: DWI · axial · 6.0mm · 1.42mm/px · 1 of 42 slices shown (2 of 2)]
[im 1/42]
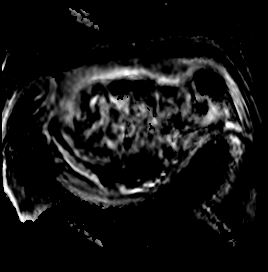

[Series 8: cor_3d_spc_trig · coronal · 1.0mm · 0.49mm/px · 3 of 88 slices shown]
[im 1/88]
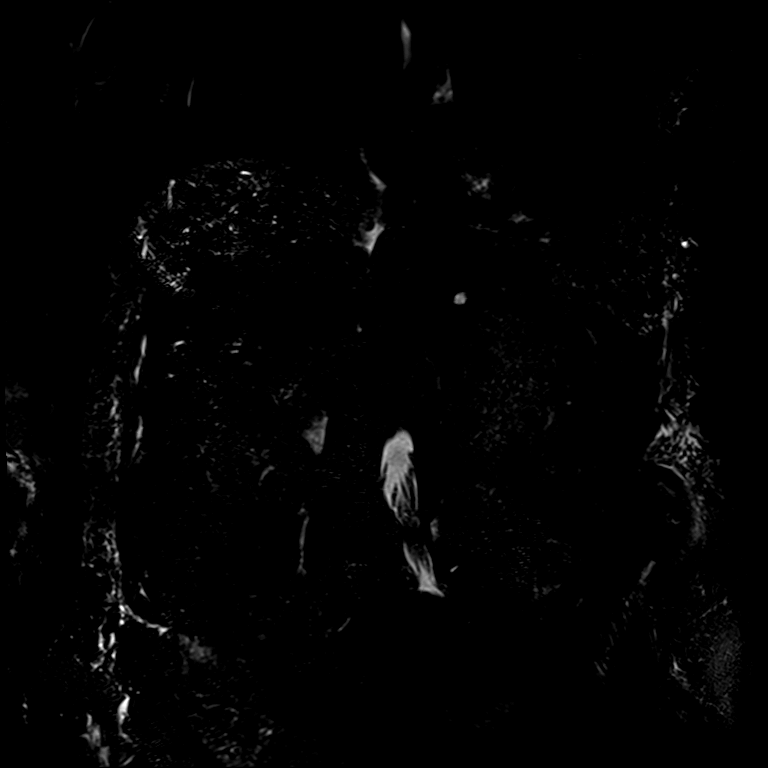
[im 44/88]
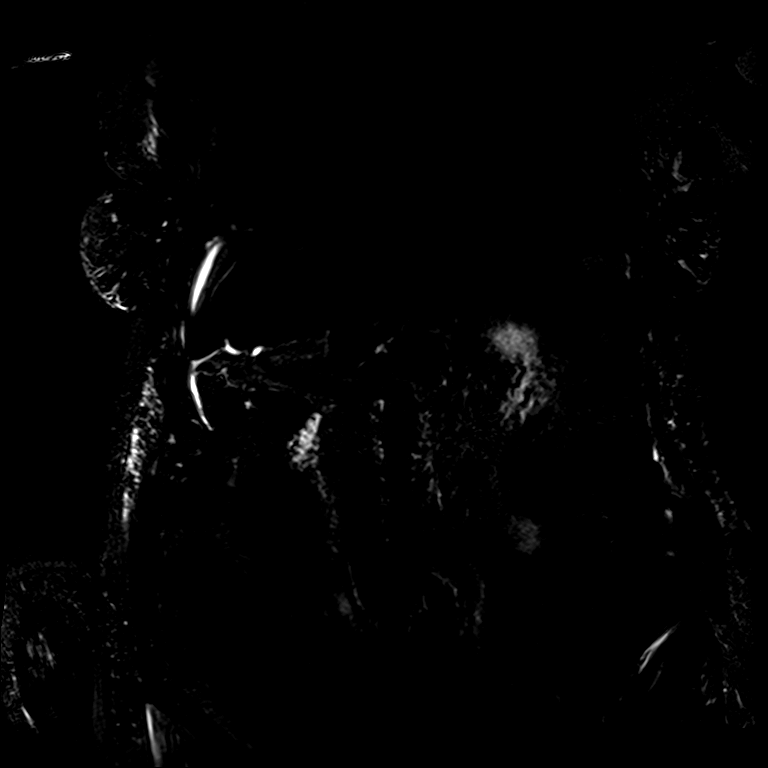
[im 88/88]
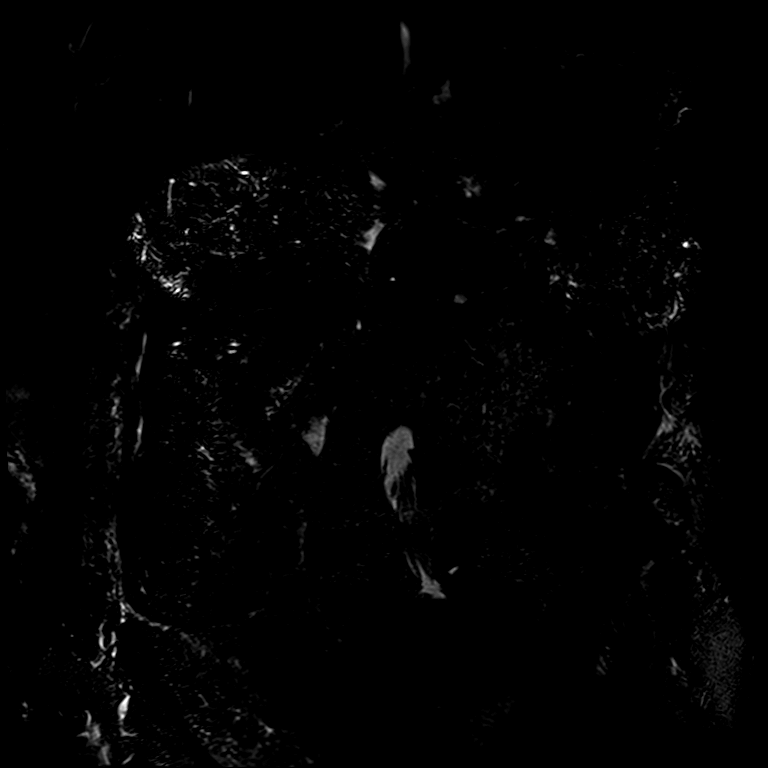

[Series 11: T2 · coronal · 6.0mm · 1.48mm/px · 1 of 30 slices shown (1 of 2)]
[im 1/30]
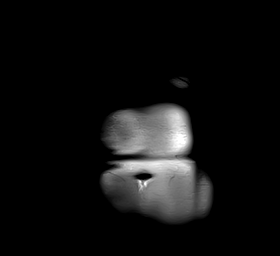

[Series 12: T1 · axial · 3.0mm · 1.25mm/px · z∈[-141,+96]mm · 3 of 80 slices shown (1 of 2)]
[im 1/80]
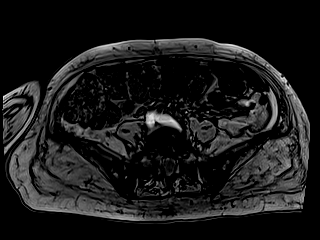
[im 40/80]
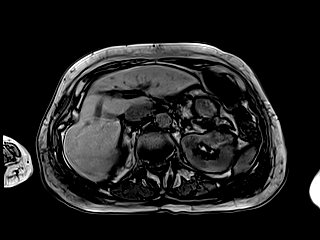
[im 80/80]
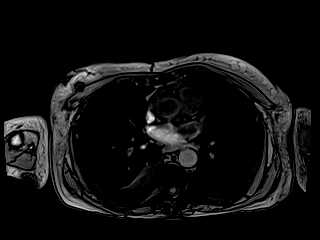

[Series 13: T1 · axial · 3.0mm · 1.25mm/px · z∈[-141,+96]mm · 3 of 80 slices shown (2 of 2)]
[im 1/80]
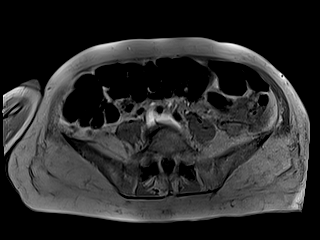
[im 40/80]
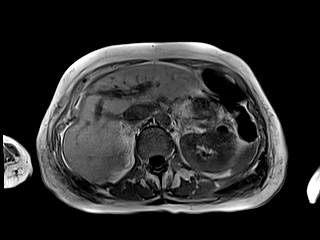
[im 80/80]
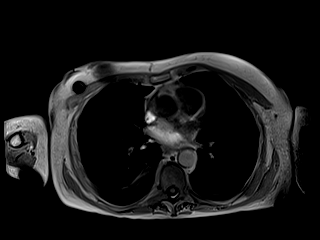

[Series 14: cor obl thk · sagittal · 50.0mm · 0.78mm/px · 1 of 9 slices shown]
[im 1/9]
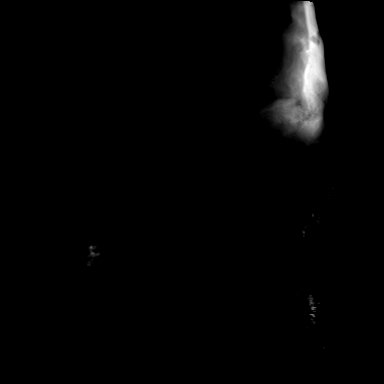

[Series 15: T2 · axial · 6.0mm · 1.56mm/px · 1 of 39 slices shown (2 of 2)]
[im 1/39]
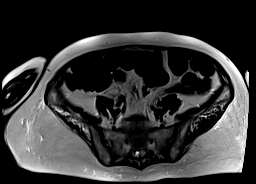

[Series 17: T1 dynamic · axial · 3.0mm · 1.25mm/px · z∈[-139,+98]mm · 3 of 80 slices shown (1 of 8)]
[im 1/80]
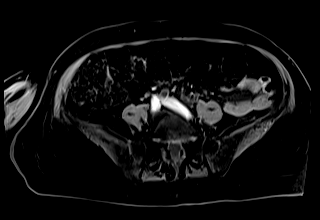
[im 40/80]
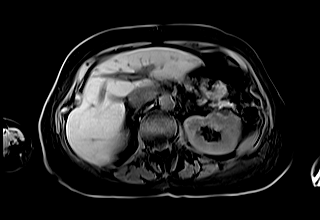
[im 80/80]
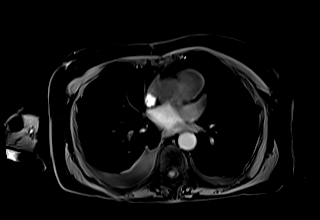

[Series 21: T1 dynamic · axial · 3.0mm · 1.25mm/px · z∈[-139,+98]mm · 3 of 80 slices shown (2 of 8)]
[im 1/80]
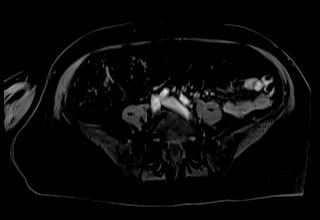
[im 40/80]
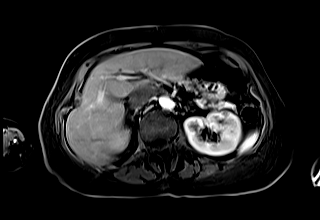
[im 80/80]
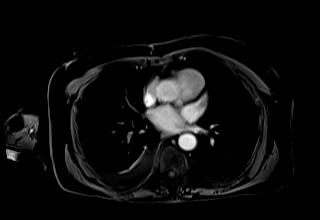

[Series 22: T1 dynamic · axial · 3.0mm · 1.25mm/px · z∈[-139,+98]mm · 3 of 80 slices shown (3 of 8)]
[im 1/80]
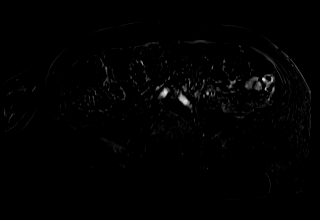
[im 40/80]
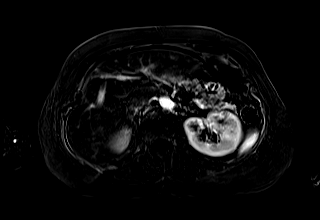
[im 80/80]
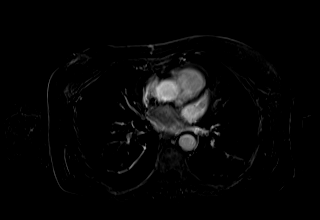

[Series 25: T1 dynamic · axial · 3.0mm · 1.25mm/px · z∈[-139,+98]mm · 3 of 80 slices shown (4 of 8)]
[im 1/80]
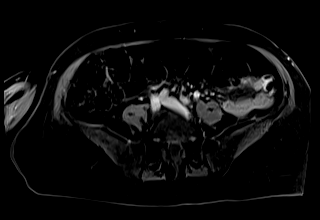
[im 40/80]
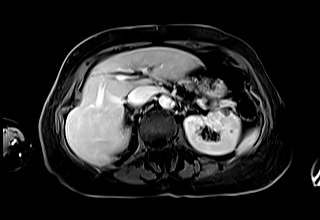
[im 80/80]
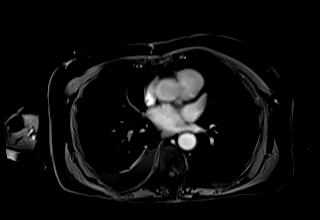

[Series 26: T1 dynamic · axial · 3.0mm · 1.25mm/px · z∈[-139,+98]mm · 3 of 80 slices shown (5 of 8)]
[im 1/80]
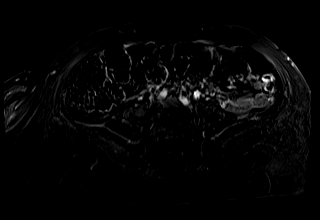
[im 40/80]
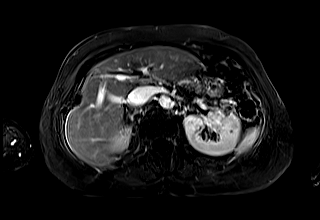
[im 80/80]
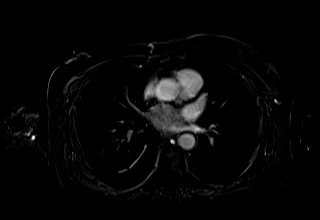

[Series 30: T1 dynamic · axial · 3.0mm · 1.25mm/px · z∈[-139,+98]mm · 3 of 80 slices shown (6 of 8)]
[im 1/80]
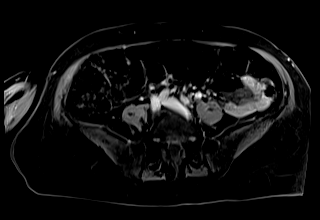
[im 40/80]
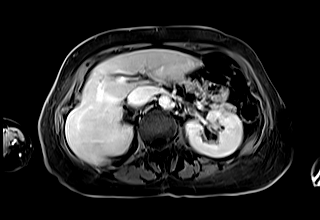
[im 80/80]
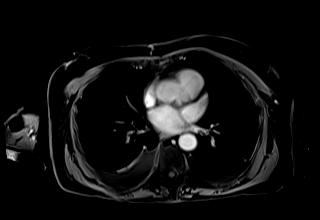

[Series 31: T1 dynamic · axial · 3.0mm · 1.25mm/px · z∈[-139,+98]mm · 3 of 80 slices shown (7 of 8)]
[im 1/80]
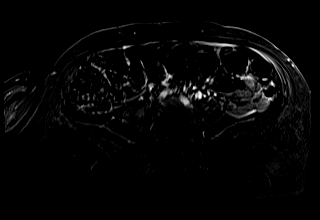
[im 40/80]
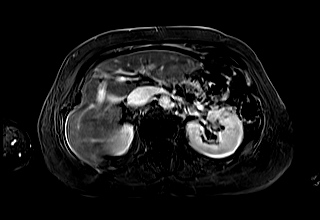
[im 80/80]
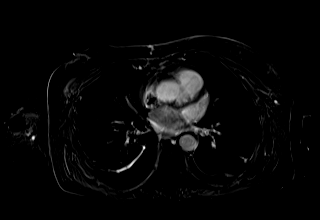

[Series 33: T1 dynamic · coronal · 4.0mm · 1.41mm/px · 1 of 56 slices shown (8 of 8)]
[im 1/56]
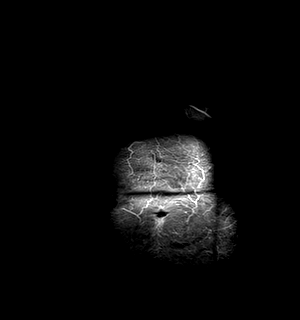

[38 of 48 positions shown; findings below may reference images not displayed]

FINDINGS: Despite efforts by the technologist and patient, motion artifact is
present on today's exam and could not be eliminated. This reduces
exam sensitivity and specificity.

Lower chest: Moderate right and small left pleural effusions,
without enhancement along the parietal pleural surface to
specifically indicate exudative effusion, an without obvious
nodularity along the visible pleura. Suspected postlumpectomy
findings in the right breast.

Hepatobiliary: No substantial hepatic steatosis. Stable chronic mild
prominence of the posterior segment right hepatic lobe without overt
hepatomegaly, the morphology does not appear substantially changed
from [DATE]. Gallbladder unremarkable. Trace fluid along the
hepatic capsular margin adjacent to the gallbladder fossa for
example on image 22 series 3.

No specific regions of infiltrative accentuated T2 signal or delayed
enhancement in the liver to indicate fibrosis. No compelling
indicators of cirrhosis. No substantial heterogeneity of enhancement
in the liver or notably enlarged porta hepatis lymph nodes.

No significant focal enhancing lesions in the liver to indicate
metastatic disease to the liver. No biliary dilatation. Motion
artifact degradation of the MRCP images.

Pancreas:  Unremarkable; no findings of autoimmune pancreatitis.

Spleen:  Unremarkable

Adrenals/Urinary Tract: 1.1 cm left kidney upper pole renal cyst
anteriorly. Adrenal glands unremarkable. Previous right
hydronephrosis and hydroureter has mostly resolved, with only some
mild fullness of the right renal pelvis currently.

Stomach/Bowel: Prominent stool throughout the colon favors
constipation.

Vascular/Lymphatic: Atherosclerosis is present, including aortoiliac
atherosclerotic disease. No pathologic adenopathy is identified.

Other: Scattered subcutaneous edema may reflect third spacing of
fluid related to the patient's hypoalbuminemia/hypoproteinemia. No
substantial upper abdominal ascites.

Musculoskeletal: Mild dextroconvex lumbar scoliosis with rotary
component. Small hemangiomas eccentric to the right in the T12 and
L2 vertebral bodies. There is evidence of lumbar spondylosis and
degenerative disc disease most notably at L4-5 there is degenerative
anterolisthesis.
IMPRESSION: 1. Although autoimmune hepatitis can be present with normal
appearing liver by imaging, common correlative imaging criteria such
as hepatic steatosis, hepatic fibrosis, enlarged porta hepatis lymph
nodes, intrahepatic biliary dilatation, heterogeneous and
heterogeneous hepatic parenchymal enhancement are absent in this
case.
2. Moderate right and small left pleural effusions along with
subcutaneous edema which may reflect third spacing of fluids. No
substantial upper abdominal ascites although there may be trace
fluid along the capsular margin of the liver adjacent to the
gallbladder fossa.
3. Previous right hydronephrosis and hydroureter has essentially
resolved, with only mild residual fullness of the right renal
pelvis.
4.  Prominent stool throughout the colon favors constipation.
5.  Aortic Atherosclerosis ([J3]-[J3]).
6. Lumbar scoliosis, spondylosis, and degenerative disc disease,
potentially causing impingement at L4-5.

## 2021-07-27 MED ORDER — LEVETIRACETAM 500 MG PO TABS
750.0000 mg | ORAL_TABLET | Freq: Two times a day (BID) | ORAL | Status: DC
  Administered 2021-07-27 – 2021-07-28 (×2): 750 mg via ORAL
  Filled 2021-07-27 (×3): qty 1

## 2021-07-27 MED ORDER — CHLORHEXIDINE GLUCONATE CLOTH 2 % EX PADS
6.0000 | MEDICATED_PAD | Freq: Every day | CUTANEOUS | Status: DC
  Administered 2021-07-27 – 2021-07-28 (×2): 6 via TOPICAL

## 2021-07-27 MED ORDER — SODIUM CHLORIDE 0.9% FLUSH: 10.0000 mL | INTRAVENOUS | Status: DC | PRN

## 2021-07-27 MED ORDER — METOPROLOL SUCCINATE ER 25 MG PO TB24
25.0000 mg | ORAL_TABLET | Freq: Two times a day (BID) | ORAL | Status: DC
  Administered 2021-07-27 – 2021-07-28 (×2): 25 mg via ORAL
  Filled 2021-07-27 (×2): qty 1

## 2021-07-27 MED ORDER — ACYCLOVIR 400 MG PO TABS
400.0000 mg | ORAL_TABLET | Freq: Two times a day (BID) | ORAL | Status: DC
  Administered 2021-07-27 – 2021-07-28 (×2): 400 mg via ORAL
  Filled 2021-07-27 (×3): qty 1

## 2021-07-27 MED ORDER — GADOBUTROL 1 MMOL/ML IV SOLN
7.0000 mL | Freq: Once | INTRAVENOUS | Status: AC | PRN
  Administered 2021-07-27: 7 mL via INTRAVENOUS

## 2021-07-27 NOTE — Consult Note (Signed)
Referring Provider:  Dr. Algis Liming, [facility] Primary Care Physician:  [name], MD Primary Gastroenterologist:  Dr. Loletha Carrow  Reason for Consultation:  Elevated LFTs  HPI: Angela Carpenter is a 80 y.o. 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 fib, dyslipidemia, hypertension, osteoporosis, CVA with right hemiparesis, breast cancer, presented to the [facility] ED on 07/26/2021 upon the advice of Dr. Alen Blew, her oncologist, due to jaundice and progressively worsening liver function test for further evaluation and management.   CT abdomen and pelvis without contrast:  IMPRESSION: No evidence of new metastatic disease in the abdomen or pelvis.   New moderate right hydronephrosis and hydroureter with no obstructing stone or mass visualized. Thick-walled and trabeculated urinary bladder suggesting bladder dysfunction.   New centrilobular nodules of the bilateral lower lobes, likely due to infection or aspiration. Recommend attention on follow-up.   Previously seen lingular nodules are not apparent on today's exam.   Previously seen lesion of the posterior left thigh is not included in the field of view.   Aortic Atherosclerosis (ICD10-I70.0).   LFTs were relatively normal in July with only ALT elevated at 55.  First time seen to be elevated on 9/15, a month ago.  Peak ALT 1023, AST 712, and ALP 966 2 days ago.  Since then they have trended down slightly except total bili which is the highest today at 3.2.  Feels fine otherwise so she is frustrated and tearful and wants to go home.  Was on Keytruda until March 29, 2021 when she had her last treatment of that.  Then she received one cycle on 06/21/2021 of Ipilimumab and nivolumab.  Past Medical History:  Diagnosis Date   A-fib [facility])    Atrial fibrillation ([place])    4 years ago started   Brain cancer ([place]) 12/2020   Breast cancer  ([place]) 2006   Colon polyps    Glaucoma    Hypercholesteremia    Hypertension    patient denies    Osteoporosis    Personal history of chemotherapy 2006   Personal history of radiation therapy 2006   Stroke (cerebrum) ([place]) 2015    Past Surgical History:  Procedure Laterality Date   APPLICATION OF CRANIAL NAVIGATION Left 05/01/2021   Procedure: APPLICATION OF CRANIAL NAVIGATION;  Surgeon: Consuella Lose, MD;  Location: [place];  Service: Neurosurgery;  Laterality: Left;   AUGMENTATION MAMMAPLASTY Bilateral 2006   Patient had them removed in 2008   BREAST BIOPSY Left 2016   BREAST LUMPECTOMY Right 2006   COLONOSCOPY     had polyps   CRANIOTOMY Left 05/01/2021   Procedure: STEREOTACTIC FRONTAL CRANIOTOMY TUMOR EXCISION;  Surgeon: Consuella Lose, MD;  Location: [place];  Service: Neurosurgery;  Laterality: Left;   EYE SURGERY Right    cataract   IR IMAGING GUIDED PORT INSERTION  06/06/2021   IR US GUIDE VASC ACCESS LEFT  06/06/2021   MELANOMA EXCISION WITH SENTINEL LYMPH NODE BIOPSY N/A 06/02/2019   Procedure: Left lateral ANAL MELANOMA EXCISION WITH Bilateral Groin exploration and SENTINEL node mapping;  Surgeon: [name], MD;  Location: [place];  Service: General;  Laterality: N/A;   melanoma removal Left    crown   TONSILLECTOMY      Prior to Admission medications   Medication Sig Start Date End Date Taking? Authorizing Provider  acyclovir (ZOVIRAX) 400 MG tablet Take 1 tablet (400 mg total) by mouth 2 (two) times  daily. 06/13/21  Yes [name], MD  atorvastatin (LIPITOR) 10 MG tablet Take 1 tablet (10 mg total) by mouth daily. 06/13/21  Yes [name], MD  bethanechol (URECHOLINE) 50 MG tablet Take 1 tablet (50 mg total) by mouth 3 (three) times daily. 06/13/21  Yes [name], MD  camphor-menthol [facility]) lotion Apply topically as needed for itching. Patient taking differently: Apply 1 application topically daily as needed for itching. 05/29/21  Yes Love,  Ivan Anchors, PA-C  cetirizine (ZYRTEC) 10 MG tablet Take 10 mg by mouth 2 (two) times daily.   Yes [provider]  Cholecalciferol (VITAMIN D-3) 125 MCG (5000 UT) TABS Take 5,000 Units by mouth daily. 05/29/21  Yes [name], MD  dexamethasone (DECADRON) 1 MG tablet Take 1 tablet (1 mg total) by mouth 2 (two) times daily with a meal. Patient taking differently: Take 2 mg by mouth daily. 07/12/21  Yes [name], MD  hydrOXYzine (ATARAX/VISTARIL) 25 MG tablet Take 25 mg by mouth 3 (three) times daily. 07/18/21  Yes [provider]  levETIRAcetam (KEPPRA) 750 MG tablet Take 1 tablet (750 mg total) by mouth 2 (two) times daily. 06/13/21  Yes [name], MD  metoprolol succinate (TOPROL-XL) 25 MG 24 hr tablet Take 1 tablet (25 mg total) by mouth 2 (two) times daily. 06/13/21  Yes [name], MD  Multiple Vitamins-Minerals (PRESERVISION AREDS 2+MULTI VIT) CAPS Take 1 capsule by mouth 2 (two) times daily.   Yes [provider]  pantoprazole (PROTONIX) 40 MG tablet Take 1 tablet (40 mg total) by mouth at bedtime. 06/13/21  Yes [name], MD  polyethylene glycol (MIRALAX / GLYCOLAX) 17 g packet Take 17 g by mouth 2 (two) times daily. 05/29/21  Yes Love, Ivan Anchors, PA-C  raloxifene (EVISTA) 60 MG tablet Take 1 tablet (60 mg total) by mouth in the morning. 06/13/21  Yes [name], MD  senna-docusate (SENOKOT-S) 8.6-50 MG tablet Take 1 tablet by mouth 2 (two) times daily. 05/29/21  Yes Love, Ivan Anchors, PA-C  tamsulosin (FLOMAX) 0.4 MG CAPS capsule Take 1 capsule (0.4 mg total) by mouth daily after supper. 06/13/21  Yes [name], MD  timolol (TIMOPTIC) 0.5 % ophthalmic solution Place 1 drop into both eyes 2 (two) times daily. 06/13/21  Yes [name], MD  lidocaine-prilocaine (EMLA) cream Apply 1 application topically as needed. Patient not taking: No sig reported 06/04/21   [name], MD  nutrition supplement, JUVEN, (JUVEN) PACK Take 1  packet by mouth 2 (two) times daily between meals. Patient not taking: No sig reported 05/29/21   Love, Ivan Anchors, PA-C  ondansetron (ZOFRAN) 4 MG tablet Take 1 tablet (4 mg total) by mouth every 4 (four) hours as needed for nausea or vomiting. Patient not taking: No sig reported 06/13/21   [name], MD  triamcinolone ointment (KENALOG) 0.5 % Apply 1 application topically 2 (two) times daily. Patient not taking: No sig reported 07/18/21   [name], MD    Current Facility-Administered Medications  Medication Dose Route Frequency Provider Last Rate Last Admin   acyclovir (ZOVIRAX) tablet 400 mg  400 mg Oral BID Hongalgi, Lenis Dickinson, MD       bethanechol (URECHOLINE) tablet 50 mg  50 mg Oral TID Modena Jansky, MD   50 mg at 07/26/21 2136   bisacodyl (DULCOLAX) suppository 10 mg  10 mg Rectal Daily PRN Modena Jansky, MD  camphor-menthol (SARNA) lotion 1 application  1 application Topical Daily PRN Hongalgi, Lenis Dickinson, MD       Chlorhexidine Gluconate Cloth 2 % PADS 6 each  6 each Topical Daily Hongalgi, Lenis Dickinson, MD       cholecalciferol (VITAMIN D) tablet 5,000 Units  5,000 Units Oral Daily Hongalgi, Lenis Dickinson, MD       enoxaparin (LOVENOX) injection 40 mg  40 mg Subcutaneous Q24H Modena Jansky, MD   40 mg at 07/26/21 2140   hydrOXYzine (ATARAX/VISTARIL) tablet 25 mg  25 mg Oral TID Modena Jansky, MD   25 mg at 07/26/21 2130   levETIRAcetam (KEPPRA) tablet 750 mg  750 mg Oral BID Vernell Leep D, MD   750 mg at 07/26/21 2135   loratadine (CLARITIN) tablet 10 mg  10 mg Oral Daily Hongalgi, Lenis Dickinson, MD       methylPREDNISolone sodium succinate (SOLU-MEDROL) 125 mg/2 mL injection 70 mg  70 mg Intravenous Q24H Modena Jansky, MD   70 mg at 07/26/21 2128   metoprolol succinate (TOPROL-XL) 24 hr tablet 25 mg  25 mg Oral BID Vernell Leep D, MD   25 mg at 07/26/21 2130   pantoprazole (PROTONIX) EC tablet 40 mg  40 mg Oral QHS Modena Jansky, MD   40 mg at 07/26/21 2130    polyethylene glycol (MIRALAX / GLYCOLAX) packet 17 g  17 g Oral BID Modena Jansky, MD   17 g at 07/26/21 2135   senna-docusate (Senokot-S) tablet 1 tablet  1 tablet Oral BID Modena Jansky, MD   1 tablet at 07/26/21 2130   sodium chloride flush (NS) 0.9 % injection 10-40 mL  10-40 mL Intracatheter PRN Modena Jansky, MD       tamsulosin (FLOMAX) capsule 0.4 mg  0.4 mg Oral QPC supper Vernell Leep D, MD   0.4 mg at 07/26/21 2130   timolol (TIMOPTIC) 0.5 % ophthalmic solution 1 drop  1 drop Both Eyes BID Modena Jansky, MD   1 drop at 07/26/21 2136    Allergies as of 07/26/2021   (No Known Allergies)    Family History  Problem Relation Age of Onset   Lung cancer Mother    Hypotension Mother    Pancreatic cancer Father    Hypertension Sister    Kidney cancer Brother    Cirrhosis Maternal Grandfather    Breast cancer Paternal Grandmother    Cirrhosis Paternal Grandfather    Breast cancer Paternal Aunt     Social History   Socioeconomic History   Marital status: Widowed    Spouse name: Not on file   Number of children: 1   Years of education: Not on file   Highest education level: Not on file  Occupational History   Occupation: retired  Tobacco Use   Smoking status: Never    Passive exposure: Yes   Smokeless tobacco: Never   Tobacco comments:    smoked in college but didn't inhale  Vaping Use   Vaping Use: Never used  Substance and Sexual Activity   Alcohol use: No    Alcohol/week: 0.0 standard drinks   Drug use: No   Sexual activity: Not Currently  Other Topics Concern   Not on file  Social History Narrative   Not on file   Social Determinants of Health   Financial Resource Strain: Not on file  Food Insecurity: Not on file  Transportation Needs: Not on file  Physical Activity: Not  on file  Stress: Not on file  Social Connections: Not on file  Intimate Partner Violence: Not on file   Review of Systems: ROS is O/W negative except as  mentioned in HPI.  Physical Exam: Vital signs in last 24 hours: Temp:  [97.5 F (36.4 C)-98.6 F (37 C)] 97.5 F (36.4 C) (10/21 0828) Pulse Rate:  [67-77] 69 (10/21 0900) Resp:  [18-20] 20 (10/21 0828) BP: (112-135)/(52-75) 112/52 (10/21 0828) SpO2:  [95 %-100 %] 100 % (10/21 0900) Weight:  [69.2 kg-69.3 kg] 69.2 kg (10/21 0500)   General:  Alert, Well-developed, well-nourished, pleasant and cooperative in NAD Head:  Normocephalic and atraumatic. Eyes:  Sclera clear, no icterus.  Conjunctiva pink. Ears:  Normal auditory acuity. Mouth:  No deformity or lesions.   Lungs:  Clear throughout to auscultation.  No wheezes, crackles, or rhonchi.  Heart:  Regular rate and rhythm; no murmurs, clicks, rubs, or gallops. Abdomen:  Soft, non-distended.  BS present.  Non-tender. Msk:  Symmetrical without gross deformities. Pulses:  Normal pulses noted. Extremities:  Without clubbing or edema. Neurologic:  Alert and oriented x 4 Skin:  Intact without significant lesions or rashes.  Lab Results: Recent Labs    07/25/21 1415 07/26/21 1613 07/27/21 0528  WBC 12.7* 12.0* 13.7*  HGB 9.5* 9.5* 9.7*  HCT 28.9* 29.1* 29.9*  PLT 295 276 286   BMET Recent Labs    07/25/21 1415 07/26/21 1613 07/27/21 0528  NA 137 136 140  K 4.1 4.2 3.9  CL 107 107 109  CO2 19* 22 22  GLUCOSE 243* 175* 157*  BUN 23 25* 24*  CREATININE 1.46* 1.24* 1.14*  CALCIUM 8.5* 8.6* 8.5*   LFT Recent Labs    07/27/21 0528  PROT 5.4*  ALBUMIN 2.8*  AST 595*  ALT 923*  ALKPHOS 775*  BILITOT 3.2*   PT/INR Recent Labs    07/26/21 1613  LABPROT 15.0  INR 1.2   Hepatitis Panel Recent Labs    07/26/21 1613  HEPBSAG NON REACTIVE  HCVAB NON REACTIVE  HEPAIGM NON REACTIVE  HEPBIGM NON REACTIVE   IMPRESSION:  *80 year old female with 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  Looks like LFTs started going up about a month ago,  first noted on 9/15 which was the day of the first and only cycle of ipilimumab and nivolumab.  Her immune checkpoint inhibitor (ipilimumab and nivolumab therapy) certainly can cause hepatotoxicity, but the day that she received those her LFTs were already up some.  Acute viral hepatitis studies are negative.  MRCP has been ordered to rule out obstructive process.  Autoimmune hepatitis markers have been ordered.  She has been started on methylprednisolone at the recommendation of Dr. Alen Blew.  LFTs have trended down slightly today, the past couple of days actually.  PLAN: -Await results of the MRCP. -Trend LFTs.  Agree with Dr. Alen Blew, if MRCP is negative and she continues to improve on methylprednisolone and she can likely be discharged in 24 to 48 hours on his recommended doses of prednisone. -Await results of autoimmune markers.  Laban Emperor. Glenda Spelman  07/27/2021, 9:25 AM

## 2021-07-27 NOTE — Progress Notes (Signed)
The results of laboratory data as well as MRCP discussed with the patient's son via phone.  I appreciate the input from GI and the care of the hospitalist team.  This appears to be drug related hepatitis possibly autoimmune in nature.  I concur with the plan to switch her to oral prednisone if her liver function tests continue to trend down and it is reasonable to discharge her at that point.  Will arrange follow-up at the cancer center next week to repeat laboratory testing.  This plan was discussed and agreed upon by the patient's son Angela Carpenter today.  Please call with any questions.

## 2021-07-27 NOTE — Progress Notes (Signed)
Pt. Was seen for OT evaluation to assess for needs. Pt.  Has R foot drop and decreased functional use of R UE. Pt. Has decreased ability to care for herself. Pt. Would benefit from skilled OT while in hospital. Pt. Already is active with a [place] agency for PT and OT PTA. Acute OT to follow while in the hospital.   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 fib, dyslipidemia, hypertension, osteoporosis, CVA with right hemiparesis, breast cancer, presented to the [facility] ED on 07/26/2021 upon the advice of Dr. Alen Blew, her oncologist, due to jaundice and progressively worsening liver function  Precautions  Precautions Fall  Precaution Comments Pt. reports no recent falls  Restrictions  Weight Bearing Restrictions No  Home Living  Family/patient expects to be discharged to: Private residence  Living Arrangements Children  Available Help at Discharge Available 24 hours/day  Type of [place] to enter;Ramped entrance  Entrance Stairs-Number of Steps 4  [place] Two level;Able to live on main level with bedroom/bathroom  Bathroom Shower/Tub Walk-in shower;Door  [place] height  Bathroom Accessibility Yes  Home Equipment Grab bars - tub/shower;Shower seat;Cane - single point;Walker - 2 wheels;Transport chair  Prior Function  Level of [place] Needs assistance (Pt. reports she has needed help for several months. Pt. was i prior to sx in march 2022.)  ADL's / Homemaking Assistance Needed Has been needing assist with shower transfer. Pt. states she was I with sink bath and dressing.  Comments family has been doing the majority of  adls and meal prep.  Communication  Communication Expressive difficulties;Other (comment)  Pain Assessment  Pain Assessment No/denies pain  Cognition  Arousal/Alertness Awake/alert  Behavior During Therapy WFL for tasks assessed/performed  Overall Cognitive Status  (slowed processing)  Upper Extremity Assessment  Upper Extremity Assessment RUE deficits/detail  RUE Deficits / Details unable to lift against gravity. aarom 80 degrees. elbow flex 80. aarom wnl for elbow. wrist ex 10 and aarom 20. able to make flex and ext digits.  RUE Sensation WNL  RUE Coordination decreased fine motor;decreased gross motor  Lower Extremity Assessment  Lower Extremity Assessment Defer to PT evaluation  ADL  Overall ADL's  Needs assistance/impaired  Eating/Feeding Set up;Sitting  Grooming Wash/dry hands;Wash/dry face;Oral care;Minimal assistance;Sitting  Upper Body Bathing Minimal assistance;Sitting  Lower Body Bathing Moderate assistance;Maximal assistance;Sit to/from stand  Upper Body Dressing  Minimal assistance;Sitting  Lower Body Dressing Maximal assistance;Sit to/from stand  Toilet Transfer Minimal assistance;RW;Comfort height toilet  Toileting- Clothing Manipulation and Hygiene Minimal assistance;Sit to/from stand  Functional mobility during ADLs Minimal assistance;Rolling walker  General ADL Comments Pt. is only able to use R hand as a gross assist.  Vision- History  Baseline Vision/History 1 Wears glasses  Ability to See in Adequate Light 1 Impaired (Pt. states acuity is not as good but it is getting better.)  Patient Visual Report No change from baseline  Transfers  Overall transfer level Needs assistance  Equipment used Rolling walker (2 wheeled)  Transfers Stand Pivot Transfers;Sit to/from Stand  Sit to [employer] assist  Stand pivot transfers Min assist  General transfer comment Pt. r le slids forward during sit to stand. Pt. has  foot drop in r le.  Balance  Overall balance  assessment Mild deficits observed, not formally tested  OT - End of Session  Equipment Utilized During Treatment Rolling walker  Activity Tolerance Patient tolerated treatment well  Patient left in chair;with call bell/phone within reach  OT Assessment  OT Recommendation/Assessment Patient needs continued OT Services  OT Visit Diagnosis Unsteadiness on feet (R26.81);Muscle weakness (generalized) (M62.81)  OT Problem List Decreased range of motion;Decreased strength;Decreased activity tolerance;Impaired balance (sitting and/or standing);Decreased coordination  OT Plan  OT Frequency (ACUTE ONLY) Min 2X/week  OT Treatment/Interventions (ACUTE ONLY) Self-care/ADL training;Neuromuscular education;DME and/or AE instruction;Therapeutic activities;Patient/family education  AM-PAC OT "6 Clicks" Daily Activity Outcome Measure (Version 2)  Help from another person eating meals? 3  Help from another person taking care of personal grooming? 3  Help from another person toileting, which includes using toliet, bedpan, or urinal? 3  Help from another person bathing (including washing, rinsing, drying)? 2  Help from another person to put on and taking off regular upper body clothing? 3  Help from another person to put on and taking off regular lower body clothing? 2  6 Click Score 16  Progressive Mobility  What is the highest level of mobility based on the progressive mobility assessment? Level 4 (Walks with assist in room) - Balance while marching in place and cannot step forward and back - Complete  Mobility Ambulated with assistance in room  OT Recommendation  Follow Up Recommendations Home health OT  OT Equipment None recommended by OT  Individuals Consulted  Consulted and Agree with Results and Recommendations Patient  Acute Rehab OT Goals  Patient Stated Goal go home  OT Goal Formulation With patient  Time For Goal Achievement 08/10/21  Potential to Achieve Goals Good  OT Time Calculation  OT  Start Time (ACUTE ONLY) 1153  OT Stop Time (ACUTE ONLY) 1236  OT Time Calculation (min) 43 min  OT General Charges  $OT Visit 1 Visit  OT Evaluation  $OT Eval Moderate Complexity 1 Mod  OT Treatments  $Self Care/Home Management  8-22 mins  Written Expression  Dominant Hand Right  Reece Packer OT/L

## 2021-07-27 NOTE — Progress Notes (Signed)
IP PROGRESS NOTE  Subjective:   Events overnight noted.  Patient hospitalized and was started on Solu-Medrol and scheduled to have MRI this morning.  Clinically she feels the same without any major complaints.  She still has jaundice although does not feel any different.  She denies any abdominal pain or diarrhea.  Objective:  Vital signs in last 24 hours: Temp:  [98 F (36.7 C)-98.6 F (37 C)] 98.1 F (36.7 C) (10/21 0422) Pulse Rate:  [67-77] 70 (10/21 0422) Resp:  [18-20] 20 (10/21 0422) BP: (117-135)/(54-75) 122/65 (10/21 0422) SpO2:  [95 %-100 %] 98 % (10/21 0422) Weight:  [152 lb 8.9 oz (69.2 kg)-152 lb 12.5 oz (69.3 kg)] 152 lb 8.9 oz (69.2 kg) (10/21 0500) Weight change:     Intake/Output from previous day:  General: Alert, awake without distress. Head: Normocephalic atraumatic. Mouth: mucous membranes moist, pharynx normal without lesions Eyes: No scleral icterus.  Pupils are equal and round reactive to light. Resp: clear to auscultation bilaterally without rhonchi or wheezes or dullness to percussion. Cardio: regular rate and rhythm, S1, S2 normal, no murmur, click, rub or gallop GI: soft, non-tender; bowel sounds normal; no masses,  no organomegaly Musculoskeletal: No joint deformity or effusion. Neurological: No motor, sensory deficits.  Intact deep tendon reflexes. Skin: No rashes or lesions.  Portacath without erythema  Lab Results: Recent Labs    07/26/21 1613 07/27/21 0528  WBC 12.0* 13.7*  HGB 9.5* 9.7*  HCT 29.1* 29.9*  PLT 276 286    BMET Recent Labs    07/26/21 1613 07/27/21 0528  NA 136 140  K 4.2 3.9  CL 107 109  CO2 22 22  GLUCOSE 175* 157*  BUN 25* 24*  CREATININE 1.24* 1.14*  CALCIUM 8.6* 8.5*    Studies/Results: No results found.  Medications: I have reviewed the patient's current medications.  Assessment/Plan:   80 year old with:  1.   Stage IV mucosal melanoma with CNS involvement documented in March 2022.        She has received 1 cycle of immunotherapy utilizing ipilimumab and nivolumab therapy is on hold due to elevation in her liver function test.  Treatment will remain on hold until her enzymes normalize.     2.  Acute hepatitis: Unclear etiology at this time and work-up ongoing.  Her AST and ALT were mildly elevated prior to the start of immunotherapy but could have been exacerbated.  I am in favor of keeping her Solu-Medrol briefly and switch to prednisone equivalent dose of her liver function started to trend down pending her evaluation for other etiology.  Laboratory data from today reviewed and showed stabilization of her counts and may be slight decline.  Her bilirubin remains elevated at 3.2.  I appreciate the input from GI regarding this issue.      3.  CNS metastasis: She is status post so surgical resection and radiation.  Dexamethasone has been stopped given her Solu-Medrol dose.   4. Disposition, prognosis and CODE STATUS: I have no objections to discharge in the next 24 to 48 hours if her work-up does not reveal any acute process and her liver function tests start to trend down.  We will continue to follow during her hospitalization.      35 minutes were spent on this encounter.  50% of the the time was face-to-face and was dedicated to reviewing laboratory data, disease status update, treatment choices and future plan of care review.    LOS: 1 day   Angela Carpenter  Angela Carpenter 07/27/2021, 7:14 AM

## 2021-07-27 NOTE — Progress Notes (Signed)
PT Cancellation Note  Patient Details Name: AERYN MEDICI MRN: [id number] DOB: 04/16/1941   Cancelled Treatment:    Reason Eval/Treat Not Completed: Patient at procedure or test/unavailable at time of attempt for  eval. Will see tomorrow.   Claretha Cooper 07/27/2021, 5:04 PM  [place] Pager [phone] Office [phone]

## 2021-07-27 NOTE — Progress Notes (Signed)
PROGRESS NOTE   Angela Carpenter  RJJ:[id number]    DOB: 07-06-1941    DOA: 07/26/2021  PCP: [name], MD   I have briefly reviewed patients previous medical records in [facility].  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 fib, dyslipidemia, hypertension, osteoporosis, CVA with right hemiparesis, breast cancer, presented to the [facility] ED on 07/26/2021 upon the advice of Dr. Alen Blew, her oncologist, due to jaundice and progressively worsening liver function test for further evaluation and management in the context of receiving first cycle of immunotherapy (ipilimumab and nivolumab) on 06/21/2021.  [facility] consulted.  Evaluation ongoing.   Assessment & Plan:  Principal Problem:   Hepatitis Active Problems:   PAF (paroxysmal atrial fibrillation) (HCC)   Hyperlipidemia   Hemiparesis (HCC)   Metastatic cancer to brain (HCC)   Slow transit constipation   Benign essential HTN     Abnormal LFTs/mixed hepatitis and cholestatic picture: - Unclear etiology.  High index of suspicion for autoimmune hepatitis related to recent immunotherapy. - Sent in by her primary oncologist to the hospital for further evaluation and management. [facility] consulted. - Acute hepatitis panel negative. - ANA, anti-smooth muscle antibody, antimitochondrial antibody-pending. - MRI abdomen with contrast and MRCP to be done this afternoon. - As per oncology and GI input, started on high-dose IV methylprednisolone.  AST and ALT slightly better compared to yesterday. - Hold statins for now  Acute kidney injury stage II chronic kidney disease - Creatinine in August was in 0.5-0.3 range. - Presented with creatinine of 1.46.   Clinically euvolemic or even somewhat volume overloaded in the legs. - Avoid nephrotoxic's. - Creatinine is improved to 1.14. - Check renal ultrasound  Constipation: - No clinical concern for bowel obstruction. - Had a good BM after Dulcolax suppository on night of admission. - Continue MiraLAX and senna.   Right hydronephrosis and hydroureter: - Noted on CT abdomen 10/13. - Check renal ultrasound and could consider urology consultation if persisting. - Continue bethanechol and tamsulosin.   Stage IV mucosal melanoma with CNS involvement - Outpatient follow-up with oncology. - Since patient is going to be on high-dose IV methylprednisolone, holding home dose of oral Decadron which can be resumed after completion of IV steroid. - Dr. Alen Blew input appreciated.  Anemia, chronic - Likely multifactorial related to chronic disease, malignancy and treatment for same. - Stable.   CVA with right hemiparesis - Patient not on antiplatelets or anticoagulation, likely related to CNS involvement. - PT and OT evaluation.   Hyperlipidemia - Holding statins due to abnormal LFTs.  Essential hypertension - Continue Toprol-XL.  Controlled.   History of seizures - Continue Keppra.  Seizure precautions.  Body mass index is 25.39 kg/m.   DVT prophylaxis: enoxaparin (LOVENOX) injection 40 mg Start: 07/26/21 1845     Code Status: DNR Family Communication: None at bedside. Disposition:  Status is: Inpatient  Remains inpatient appropriate because: Markedly abnormal LFTs pending further evaluation by GI and on high-dose IV steroids.        Consultants:   [facility].  Procedures:   None.  Antimicrobials:    Anti-infectives (From admission, onward)    Start     Dose/Rate Route Frequency Ordered  Stop   07/27/21 1000  acyclovir (ZOVIRAX) tablet 400 mg        400 mg Oral 2 times daily 07/26/21 1844           Subjective:  Reports that she had a good BM after  suppository last night with improvement in her abdominal distention and bloating.  Did not sleep well last night due to disturbance on the floor.  Otherwise denies any complaints.  Objective:   Vitals:   07/27/21 0422 07/27/21 0500 07/27/21 0828 07/27/21 0900  BP: 122/65  (!) 112/52   Pulse: 70  72 69  Resp: 20  20   Temp: 98.1 F (36.7 C)  (!) 97.5 F (36.4 C)   TempSrc:   Oral   SpO2: 98%  99% 100%  Weight:  69.2 kg    Height:        General exam: Elderly female, moderately built and poorly nourished sitting up comfortably in reclining chair this morning.  Scleral and skin icterus. Respiratory system: Clear to auscultation. Respiratory effort normal. Cardiovascular system: S1 & S2 heard, RRR. No JVD, murmurs, rubs, gallops or clicks.  2+ pitting bilateral leg edema up to knees. Gastrointestinal system: Abdomen is nondistended, soft and nontender. No organomegaly or masses felt. Normal bowel sounds heard. Central nervous system: Alert and oriented. No focal neurological deficits. Extremities: Symmetric 5 x 5 power. Skin: No rashes, lesions or ulcers Psychiatry: Judgement and insight appear normal. Mood & affect appropriate.     Data Reviewed:   I have personally reviewed following labs and imaging studies   CBC: Recent Labs  Lab 07/25/21 1415 07/26/21 1613 07/27/21 0528  WBC 12.7* 12.0* 13.7*  NEUTROABS 10.7* 9.6*  --   HGB 9.5* 9.5* 9.7*  HCT 28.9* 29.1* 29.9*  MCV 93.5 96.4 96.1  PLT 295 276 517    Basic Metabolic Panel: Recent Labs  Lab 07/25/21 1415 07/26/21 1613 07/27/21 0528  NA 137 136 140  K 4.1 4.2 3.9  CL 107 107 109  CO2 19* 22 22  GLUCOSE 243* 175* 157*  BUN 23 25* 24*  CREATININE 1.46* 1.24* 1.14*  CALCIUM 8.5* 8.6* 8.5*    Liver Function Tests: Recent Labs  Lab 07/25/21 1415 07/26/21 1613 07/27/21 0528  AST 712* 680* 595*  ALT 1,023* 946* 923*  ALKPHOS 966* 810* 775*  BILITOT 3.0* 3.1* 3.2*  PROT 5.7* 5.6* 5.4*  ALBUMIN 2.7*  3.0* 2.8*    CBG: No results for input(s): GLUCAP in the last 168 hours.  Microbiology Studies:   Recent Results (from the past 240 hour(s))  Resp Panel by RT-PCR (Flu A&B, Covid) Nasopharyngeal Swab     Status: None   Collection Time: 07/26/21  4:14 PM   Specimen: Nasopharyngeal Swab; Nasopharyngeal(NP) swabs in vial transport medium  Result Value Ref Range Status   SARS Coronavirus 2 by RT PCR NEGATIVE NEGATIVE Final    Comment: (NOTE) SARS-CoV-2 target nucleic acids are NOT DETECTED.  The SARS-CoV-2 RNA is generally detectable in upper respiratory specimens during the acute phase of infection. The lowest concentration of SARS-CoV-2 viral copies this assay can detect is 138 copies/mL. A negative result does not preclude SARS-Cov-2 infection and should not be used as the sole basis for treatment or other patient management decisions. A negative result may occur with  improper specimen collection/handling, submission of specimen other than nasopharyngeal swab, presence of viral mutation(s) within the areas targeted by this assay, and inadequate number of viral copies(<138 copies/mL). A negative  result must be combined with clinical observations, patient history, and epidemiological information. The expected result is Negative.  Fact Sheet for Patients:  EntrepreneurPulse.com.au  Fact Sheet for Healthcare Providers:  IncredibleEmployment.be  This test is no t yet approved or cleared by the [place] FDA and  has been authorized for detection and/or diagnosis of SARS-CoV-2 by FDA under an Emergency Use Authorization (EUA). This EUA will remain  in effect (meaning this test can be used) for the duration of the COVID-19 declaration under Section 564(b)(1) of the Act, 21 U.S.C.section 360bbb-3(b)(1), unless the authorization is terminated  or revoked sooner.       Influenza A by PCR NEGATIVE NEGATIVE Final   Influenza B by PCR NEGATIVE  NEGATIVE Final    Comment: (NOTE) The Xpert Xpress SARS-CoV-2/FLU/RSV plus assay is intended as an aid in the diagnosis of influenza from Nasopharyngeal swab specimens and should not be used as a sole basis for treatment. Nasal washings and aspirates are unacceptable for Xpert Xpress SARS-CoV-2/FLU/RSV testing.  Fact Sheet for Patients: EntrepreneurPulse.com.au  Fact Sheet for Healthcare Providers: IncredibleEmployment.be  This test is not yet approved or cleared by the [place] FDA and has been authorized for detection and/or diagnosis of SARS-CoV-2 by FDA under an Emergency Use Authorization (EUA). This EUA will remain in effect (meaning this test can be used) for the duration of the COVID-19 declaration under Section 564(b)(1) of the Act, 21 U.S.C. section 360bbb-3(b)(1), unless the authorization is terminated or revoked.  Performed at [facility], [place] [address], [place], [place] [address]      Radiology Studies:  No results found.   Scheduled Meds:    acyclovir  400 mg Oral BID   bethanechol  50 mg Oral TID   Chlorhexidine Gluconate Cloth  6 each Topical Daily   cholecalciferol  5,000 Units Oral Daily   enoxaparin (LOVENOX) injection  40 mg Subcutaneous Q24H   hydrOXYzine  25 mg Oral TID   levETIRAcetam  750 mg Oral BID   loratadine  10 mg Oral Daily   methylPREDNISolone (SOLU-MEDROL) injection  70 mg Intravenous Q24H   metoprolol succinate  25 mg Oral BID   pantoprazole  40 mg Oral QHS   polyethylene glycol  17 g Oral BID   senna-docusate  1 tablet Oral BID   tamsulosin  0.4 mg Oral QPC supper   timolol  1 drop Both Eyes BID    Continuous Infusions:     LOS: 1 day     Vernell Leep, MD, FACP, [facility]. [facility]    To contact the attending provider between 7A-7P or the covering provider during after hours 7P-7A, please log into the web site [email or web address] and access using universal  [place] password for that web site. If you do not have the password, please call the hospital operator.  07/27/2021, 9:28 AM

## 2021-07-28 ENCOUNTER — Other Ambulatory Visit: Payer: Self-pay | Admitting: Gastroenterology

## 2021-07-28 DIAGNOSIS — R748 Abnormal levels of other serum enzymes: Secondary | ICD-10-CM

## 2021-07-28 LAB — COMPREHENSIVE METABOLIC PANEL
ALT: 827 U/L — ABNORMAL HIGH (ref 0–44)
AST: 479 U/L — ABNORMAL HIGH (ref 15–41)
Albumin: 2.6 g/dL — ABNORMAL LOW (ref 3.5–5.0)
Alkaline Phosphatase: 736 U/L — ABNORMAL HIGH (ref 38–126)
Anion gap: 7 (ref 5–15)
BUN: 30 mg/dL — ABNORMAL HIGH (ref 8–23)
CO2: 24 mmol/L (ref 22–32)
Calcium: 8.8 mg/dL — ABNORMAL LOW (ref 8.9–10.3)
Chloride: 109 mmol/L (ref 98–111)
Creatinine, Ser: 1.13 mg/dL — ABNORMAL HIGH (ref 0.44–1.00)
GFR, Estimated: 50 mL/min — ABNORMAL LOW (ref >60)
Glucose, Bld: 191 mg/dL — ABNORMAL HIGH (ref 70–99)
Potassium: 4.1 mmol/L (ref 3.5–5.1)
Sodium: 140 mmol/L (ref 135–145)
Total Bilirubin: 3 mg/dL — ABNORMAL HIGH (ref 0.3–1.2)
Total Protein: 5.3 g/dL — ABNORMAL LOW (ref 6.5–8.1)

## 2021-07-28 LAB — ACETAMINOPHEN LEVEL: Acetaminophen (Tylenol), Serum: <10 ug/mL — ABNORMAL LOW (ref 10–30)

## 2021-07-28 LAB — IGG: IgG (Immunoglobin G), Serum: 634 mg/dL (ref 586–1602)

## 2021-07-28 LAB — PROTIME-INR
INR: 1.3 — ABNORMAL HIGH (ref 0.8–1.2)
Prothrombin Time: 15.7 seconds — ABNORMAL HIGH (ref 11.4–15.2)

## 2021-07-28 MED ORDER — PREDNISONE 20 MG PO TABS: 60.0000 mg | ORAL_TABLET | Freq: Every day | ORAL | 1 refills | Status: DC

## 2021-07-28 MED ORDER — BISACODYL 10 MG RE SUPP: 10.0000 mg | Freq: Every day | RECTAL | 0 refills | Status: AC | PRN

## 2021-07-28 NOTE — TOC Transition Note (Signed)
Transition of Care [facility]) - CM/SW Discharge Note   Patient Details  Name: Angela Carpenter MRN: [id number] Date of Birth: 1941-05-02  [facility] CM/SW Contact:  Lennart Pall, LCSW Phone Number: 07/28/2021, 1:53 PM   Clinical Narrative:    Pt medically cleared for dc today. Pt already open with [facility] and have alerted liaison to orders for HHPT and OT.  No further TOC needs.   Final next level of care: [place] Barriers to Discharge: Barriers Resolved   Patient Goals and CMS Choice        Discharge Placement                       Discharge Plan and Services                DME Arranged: N/A DME Agency: NA       HH Arranged: PT, OT [place] Agency: [place] [facility] Date [place]: 07/28/21 Time [place]: 1352 Representative spoke with at [place]: [place] ([place]) Interventions     Readmission Risk Interventions No flowsheet data found.

## 2021-07-28 NOTE — Discharge Summary (Signed)
Physician Discharge Summary  Angela Carpenter VFI:[id number] DOB: 11-24-1940  PCP: [name], MD  Admitted from: Home Discharged to: Home  Admit date: 07/26/2021 Discharge date: 07/28/2021  Recommendations for Outpatient Follow-up:    Follow-up Information     [name], MD Follow up on 07/30/2021.   Specialty: Oncology Why: To be seen with repeat labs (CBC & CMP) Contact information: [place] [address] [phone]         Doran Stabler, MD Follow up.   Specialty: Gastroenterology Why: MDs office will call with appointment.  Please call them if you do not hear from them in 3-5 business days. Contact information: [place] [address] [phone]                  Home Health:  Home Health Orders (From admission, onward)     Start     Ordered   07/28/21 Gordon  At discharge       Question Answer Comment  To provide the following care/treatments PT   To provide the following care/treatments OT      07/28/21 1245             Equipment/Devices: Pending PT recommendations    Discharge Condition: Improved and stable.   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 fib, dyslipidemia, hypertension, osteoporosis, CVA with  right hemiparesis, breast cancer, presented to the [facility] ED on 07/26/2021 upon the advice of Dr. Alen Blew, her oncologist, due to jaundice and progressively worsening liver function test for further evaluation and management in the context of receiving first cycle of immunotherapy (ipilimumab and nivolumab) on 06/21/2021.  [facility] consulted.       Assessment & Plan:    Abnormal LFTs/mixed hepatitis and cholestatic picture: - Unclear etiology.  High index of suspicion for autoimmune hepatitis related to recent immunotherapy. - Sent in by her primary oncologist to the hospital for further evaluation and management. [facility] consulted. - Acute hepatitis panel negative. - ANA, anti-smooth muscle antibody, antimitochondrial antibody-pending.  Serum IgG normal.  GI has added further labs today including EBV, CMV serologies and antimicrosomal antibody testing and acetaminophen level. - MRI abdomen with contrast and MRCP negative for metastatic disease.  Per radiology, imaging criteria for autoimmune hepatitis were not present. - As per oncology and GI input, started on high-dose IV methylprednisolone of which she has received 2 doses thus far.  LFTs starting to gradually trend down. - Hold statins for now -GI follow-up appreciated.  Discussed in detail with Dr. Havery Moros.  As per his observation, she had mildly abnormal LFTs on 9/15 which may have been related to prior Keytruda.  She  got her first dose of immunotherapy on 9/15 followed by progressive worsening of her LFTs.  He feels true autoimmune hepatitis seems less likely.  No evidence of encephalopathy.  INR 1.3.  It is suspected that patient has drug-induced liver injury from her immunotherapy.  She is clinically stable.  He recommends transitioning to prednisone 60 Mg daily and has cleared her for discharge.  She needs close outpatient follow-up with either oncology or GI for repeat labs (CBC and CMP) on Monday, 07/30/2021.  If LFTs  continue to downtrend then steroids can be gradually tapered down but if LFTs do not decrease or worsen, then she would need a liver biopsy.  I have discussed all of this in detail with her son on the phone today and he verbalizes understanding.   Acute kidney injury stage II chronic kidney disease - Creatinine in August was in 0.5-0.3 range. - Presented with creatinine of 1.46.  Clinically euvolemic or even somewhat volume overloaded in the legs. - Avoid nephrotoxic's. - Creatinine is improved to 1.13. - MRI abdomen showed that previous right hydronephrosis and hydroureter have essentially resolved with only mild residual fullness of the right renal pelvis.  Constipation: - No clinical concern for bowel obstruction. - Had a good BM after Dulcolax suppository on night of admission. - Continue MiraLAX and senna. - MRI abdomen showed prominent stool throughout the colon favoring constipation.  Continue aggressive bowel regimen and as needed Dulcolax suppository at discharge.   Right hydronephrosis and hydroureter: - Noted on CT abdomen 10/13. - MRI results as above.  Hence renal ultrasound was not pursued. - Continue bethanechol and tamsulosin.   Stage IV mucosal melanoma with CNS involvement - Outpatient follow-up with oncology. - Since patient is going to be on high-dose oral prednisone, prior Decadron is on hold and can be resumed after completion of prednisone or as determined by her oncologist. - Dr. Alen Blew input appreciated.   Anemia, chronic - Likely multifactorial related to chronic disease, malignancy and treatment for same. - Stable.   CVA with right hemiparesis - Patient not on antiplatelets or anticoagulation, likely related to CNS involvement. - PT and OT evaluation.  At the time of this note, OT had recommended home health OT, PT had seen this morning but the recommendations were not in yet.   Hyperlipidemia - Holding statins due to abnormal LFTs.  We will also continue  to hold Evista for now.  Essential hypertension - Continue Toprol-XL.  Controlled.   History of seizures - Continue Keppra.  Seizure precautions.  Moderate right and small left pleural effusions with subcutaneous edema/anasarca: - Were noted on MRI abdomen.  No substantial ascites.  She does have lower extremity edema as well.  This may be third spacing from poor nutrition/hypoalbuminemia.  Hesitant to start diuretics right now given her acute illness.  Consider starting low-dose diuretics as outpatient with close monitoring.   Body mass index is 25.39 kg/m.          Consultants:   [facility].   Procedures:   None.      Discharge Instructions  Discharge Instructions     Call MD for:   Complete by: As directed    Worsening jaundice or itching.   Call MD for:  difficulty breathing, headache or visual disturbances   Complete by: As directed    Call MD for:  extreme fatigue   Complete by: As directed    Call MD for:  persistant dizziness or light-headedness  Complete by: As directed    Call MD for:  persistant nausea and vomiting   Complete by: As directed    Call MD for:  severe uncontrolled pain   Complete by: As directed    Call MD for:  temperature >100.4   Complete by: As directed    Diet - low sodium heart healthy   Complete by: As directed    Increase activity slowly   Complete by: As directed         Medication List     STOP taking these medications    atorvastatin 10 MG tablet Commonly known as: LIPITOR   dexamethasone 1 MG tablet Commonly known as: DECADRON   lidocaine-prilocaine cream Commonly known as: EMLA   nutrition supplement (JUVEN) Pack   ondansetron 4 MG tablet Commonly known as: ZOFRAN   raloxifene 60 MG tablet Commonly known as: EVISTA   triamcinolone ointment 0.5 % Commonly known as: KENALOG       TAKE these medications    acyclovir 400 MG tablet Commonly known as: ZOVIRAX Take 1 tablet (400 mg  total) by mouth 2 (two) times daily.   bethanechol 50 MG tablet Commonly known as: URECHOLINE Take 1 tablet (50 mg total) by mouth 3 (three) times daily.   bisacodyl 10 MG suppository Commonly known as: DULCOLAX Place 1 suppository (10 mg total) rectally daily as needed for moderate constipation.   camphor-menthol lotion Commonly known as: SARNA Apply topically as needed for itching. What changed:  how much to take when to take this   cetirizine 10 MG tablet Commonly known as: ZYRTEC Take 10 mg by mouth 2 (two) times daily.   hydrOXYzine 25 MG tablet Commonly known as: ATARAX/VISTARIL Take 25 mg by mouth 3 (three) times daily.   levETIRAcetam 750 MG tablet Commonly known as: KEPPRA Take 1 tablet (750 mg total) by mouth 2 (two) times daily.   metoprolol succinate 25 MG 24 hr tablet Commonly known as: TOPROL-XL Take 1 tablet (25 mg total) by mouth 2 (two) times daily.   pantoprazole 40 MG tablet Commonly known as: PROTONIX Take 1 tablet (40 mg total) by mouth at bedtime.   polyethylene glycol 17 g packet Commonly known as: MIRALAX / GLYCOLAX Take 17 g by mouth 2 (two) times daily.   predniSONE 20 MG tablet Commonly known as: DELTASONE Take 3 tablets (60 mg total) by mouth daily.   PreserVision AREDS 2+Multi Vit Caps Take 1 capsule by mouth 2 (two) times daily.   senna-docusate 8.6-50 MG tablet Commonly known as: Senokot-S Take 1 tablet by mouth 2 (two) times daily.   tamsulosin 0.4 MG Caps capsule Commonly known as: FLOMAX Take 1 capsule (0.4 mg total) by mouth daily after supper.   timolol 0.5 % ophthalmic solution Commonly known as: TIMOPTIC Place 1 drop into both eyes 2 (two) times daily.   Vitamin D-3 125 MCG (5000 UT) Tabs Take 5,000 Units by mouth daily.       No Known Allergies    Procedures/Studies: CT ABDOMEN PELVIS WO CONTRAST  Result Date: 07/22/2021 CLINICAL DATA:  History of melanoma EXAM: CT ABDOMEN AND PELVIS WITHOUT CONTRAST  TECHNIQUE: Multidetector CT imaging of the abdomen and pelvis was performed following the standard protocol without IV contrast. COMPARISON:  Head CT dated June 01, 2021 FINDINGS: Lower chest: Small right-greater-than-left pleural effusions associated atelectasis, slightly increased compared to prior exam. New centrilobular nodules of the bilateral lower lobes.Previously seen lingular nodules are not apparent on today's exam. Hepatobiliary: Within  limitations of exam, no focal liver lesions. Gallbladder is unremarkable. No biliary ductal dilation. Pancreas: Unremarkable. No pancreatic ductal dilatation or surrounding inflammatory changes. Spleen: Normal in size without focal abnormality. Adrenals/Urinary Tract: Bilateral adrenal glands are unremarkable. New moderate right hydronephrosis and hydroureter with no obstructing stone or mass visualized. Thick-walled urinary bladder with numerous trabeculations. Stomach/Bowel: Stomach is within normal limits. Appendix is not visualized. No evidence of bowel wall thickening, distention, or inflammatory changes. Vascular/Lymphatic: Aortic atherosclerosis. No enlarged abdominal or pelvic lymph nodes. Reproductive: Uterus and bilateral adnexa are unremarkable. Other: No abdominal wall hernia or abnormality. No abdominopelvic ascites. Musculoskeletal: Previously seen lesion of the posterior left thigh. Grade 1 anterolisthesis of L4 on L5. No suspicious osseous lesions. IMPRESSION: No evidence of new metastatic disease in the abdomen or pelvis. New moderate right hydronephrosis and hydroureter with no obstructing stone or mass visualized. Thick-walled and trabeculated urinary bladder suggesting bladder dysfunction. New centrilobular nodules of the bilateral lower lobes, likely due to infection or aspiration. Recommend attention on follow-up. Previously seen lingular nodules are not apparent on today's exam. Previously seen lesion of the posterior left thigh is not included in  the field of view. Aortic Atherosclerosis (ICD10-I70.0). Electronically Signed   By: Yetta Glassman M.D.   On: 07/22/2021 09:22   MR 3D Recon At Scanner  Result Date: 07/27/2021 CLINICAL DATA:  Metastatic melanoma including intracranial metastatic disease. Jaundice with progressive worsening liver function tests, possible autoimmune hepatitis EXAM: MRI ABDOMEN WITHOUT AND WITH CONTRAST (INCLUDING MRCP) TECHNIQUE: Multiplanar multisequence MR imaging of the abdomen was performed both before and after the administration of intravenous contrast. Heavily T2-weighted images of the biliary and pancreatic ducts were obtained, and three-dimensional MRCP images were rendered by post processing. CONTRAST:  62mL GADAVIST GADOBUTROL 1 MMOL/ML IV SOLN COMPARISON:  07/19/2021 CT scan FINDINGS: Despite efforts by the technologist and patient, motion artifact is present on today's exam and could not be eliminated. This reduces exam sensitivity and specificity. Lower chest: Moderate right and small left pleural effusions, without enhancement along the parietal pleural surface to specifically indicate exudative effusion, an without obvious nodularity along the visible pleura. Suspected postlumpectomy findings in the right breast. Hepatobiliary: No substantial hepatic steatosis. Stable chronic mild prominence of the posterior segment right hepatic lobe without overt hepatomegaly, the morphology does not appear substantially changed from 07/16/2019. Gallbladder unremarkable. Trace fluid along the hepatic capsular margin adjacent to the gallbladder fossa for example on image 22 series 3. No specific regions of infiltrative accentuated T2 signal or delayed enhancement in the liver to indicate fibrosis. No compelling indicators of cirrhosis. No substantial heterogeneity of enhancement in the liver or notably enlarged porta hepatis lymph nodes. No significant focal enhancing lesions in the liver to indicate metastatic disease to the  liver. No biliary dilatation. Motion artifact degradation of the MRCP images. Pancreas:  Unremarkable; no findings of autoimmune pancreatitis. Spleen:  Unremarkable Adrenals/Urinary Tract: 1.1 cm left kidney upper pole renal cyst anteriorly. Adrenal glands unremarkable. Previous right hydronephrosis and hydroureter has mostly resolved, with only some mild fullness of the right renal pelvis currently. Stomach/Bowel: Prominent stool throughout the colon favors constipation. Vascular/Lymphatic: Atherosclerosis is present, including aortoiliac atherosclerotic disease. No pathologic adenopathy is identified. Other: Scattered subcutaneous edema may reflect third spacing of fluid related to the patient's hypoalbuminemia/hypoproteinemia. No substantial upper abdominal ascites. Musculoskeletal: Mild dextroconvex lumbar scoliosis with rotary component. Small hemangiomas eccentric to the right in the T12 and L2 vertebral bodies. There is evidence of lumbar spondylosis and degenerative disc  disease most notably at L4-5 there is degenerative anterolisthesis. IMPRESSION: 1. Although autoimmune hepatitis can be present with normal appearing liver by imaging, common correlative imaging criteria such as hepatic steatosis, hepatic fibrosis, enlarged porta hepatis lymph nodes, intrahepatic biliary dilatation, heterogeneous and heterogeneous hepatic parenchymal enhancement are absent in this case. 2. Moderate right and small left pleural effusions along with subcutaneous edema which may reflect third spacing of fluids. No substantial upper abdominal ascites although there may be trace fluid along the capsular margin of the liver adjacent to the gallbladder fossa. 3. Previous right hydronephrosis and hydroureter has essentially resolved, with only mild residual fullness of the right renal pelvis. 4.  Prominent stool throughout the colon favors constipation. 5.  Aortic Atherosclerosis (ICD10-I70.0). 6. Lumbar scoliosis, spondylosis, and  degenerative disc disease, potentially causing impingement at L4-5. Electronically Signed   By: Van Clines M.D.   On: 07/27/2021 14:25   MR ABDOMEN MRCP W WO CONTAST  Result Date: 07/27/2021 CLINICAL DATA:  Metastatic melanoma including intracranial metastatic disease. Jaundice with progressive worsening liver function tests, possible autoimmune hepatitis EXAM: MRI ABDOMEN WITHOUT AND WITH CONTRAST (INCLUDING MRCP) TECHNIQUE: Multiplanar multisequence MR imaging of the abdomen was performed both before and after the administration of intravenous contrast. Heavily T2-weighted images of the biliary and pancreatic ducts were obtained, and three-dimensional MRCP images were rendered by post processing. CONTRAST:  69mL GADAVIST GADOBUTROL 1 MMOL/ML IV SOLN COMPARISON:  07/19/2021 CT scan FINDINGS: Despite efforts by the technologist and patient, motion artifact is present on today's exam and could not be eliminated. This reduces exam sensitivity and specificity. Lower chest: Moderate right and small left pleural effusions, without enhancement along the parietal pleural surface to specifically indicate exudative effusion, an without obvious nodularity along the visible pleura. Suspected postlumpectomy findings in the right breast. Hepatobiliary: No substantial hepatic steatosis. Stable chronic mild prominence of the posterior segment right hepatic lobe without overt hepatomegaly, the morphology does not appear substantially changed from 07/16/2019. Gallbladder unremarkable. Trace fluid along the hepatic capsular margin adjacent to the gallbladder fossa for example on image 22 series 3. No specific regions of infiltrative accentuated T2 signal or delayed enhancement in the liver to indicate fibrosis. No compelling indicators of cirrhosis. No substantial heterogeneity of enhancement in the liver or notably enlarged porta hepatis lymph nodes. No significant focal enhancing lesions in the liver to indicate  metastatic disease to the liver. No biliary dilatation. Motion artifact degradation of the MRCP images. Pancreas:  Unremarkable; no findings of autoimmune pancreatitis. Spleen:  Unremarkable Adrenals/Urinary Tract: 1.1 cm left kidney upper pole renal cyst anteriorly. Adrenal glands unremarkable. Previous right hydronephrosis and hydroureter has mostly resolved, with only some mild fullness of the right renal pelvis currently. Stomach/Bowel: Prominent stool throughout the colon favors constipation. Vascular/Lymphatic: Atherosclerosis is present, including aortoiliac atherosclerotic disease. No pathologic adenopathy is identified. Other: Scattered subcutaneous edema may reflect third spacing of fluid related to the patient's hypoalbuminemia/hypoproteinemia. No substantial upper abdominal ascites. Musculoskeletal: Mild dextroconvex lumbar scoliosis with rotary component. Small hemangiomas eccentric to the right in the T12 and L2 vertebral bodies. There is evidence of lumbar spondylosis and degenerative disc disease most notably at L4-5 there is degenerative anterolisthesis. IMPRESSION: 1. Although autoimmune hepatitis can be present with normal appearing liver by imaging, common correlative imaging criteria such as hepatic steatosis, hepatic fibrosis, enlarged porta hepatis lymph nodes, intrahepatic biliary dilatation, heterogeneous and heterogeneous hepatic parenchymal enhancement are absent in this case. 2. Moderate right and small left pleural effusions along with subcutaneous  edema which may reflect third spacing of fluids. No substantial upper abdominal ascites although there may be trace fluid along the capsular margin of the liver adjacent to the gallbladder fossa. 3. Previous right hydronephrosis and hydroureter has essentially resolved, with only mild residual fullness of the right renal pelvis. 4.  Prominent stool throughout the colon favors constipation. 5.  Aortic Atherosclerosis (ICD10-I70.0). 6. Lumbar  scoliosis, spondylosis, and degenerative disc disease, potentially causing impingement at L4-5. Electronically Signed   By: Van Clines M.D.   On: 07/27/2021 14:25      Subjective: Denies complaints.  Having daily BMs.  Discharge Exam:  Vitals:   07/27/21 0828 07/27/21 0900 07/27/21 1303 07/27/21 2017  BP: (!) 112/52  129/64 119/60  Pulse: 72 69 70 88  Resp: 20  18 16   Temp: (!) 97.5 F (36.4 C)  98.2 F (36.8 C) (!) 97.5 F (36.4 C)  TempSrc: Oral  Oral Oral  SpO2: 99% 100% 99% 100%  Weight:      Height:        General exam: Elderly female, moderately built and poorly nourished seen ambulating comfortably with PT supervision and using a walker.  Scleral and skin icterus. Respiratory system: Clear to auscultation. Respiratory effort normal. Cardiovascular system: S1 & S2 heard, RRR. No JVD, murmurs, rubs, gallops or clicks.  2+ pitting bilateral leg edema up to knees. Gastrointestinal system: Abdomen is nondistended, soft and nontender. No organomegaly or masses felt. Normal bowel sounds heard. Central nervous system: Alert and oriented. No focal neurological deficits. Extremities: Symmetric 5 x 5 power. Skin: No rashes, lesions or ulcers Psychiatry: Judgement and insight appear normal. Mood & affect appropriate.       The results of significant diagnostics from this hospitalization (including imaging, microbiology, ancillary and laboratory) are listed below for reference.     Microbiology: Recent Results (from the past 240 hour(s))  Resp Panel by RT-PCR (Flu A&B, Covid) Nasopharyngeal Swab     Status: None   Collection Time: 07/26/21  4:14 PM   Specimen: Nasopharyngeal Swab; Nasopharyngeal(NP) swabs in vial transport medium  Result Value Ref Range Status   SARS Coronavirus 2 by RT PCR NEGATIVE NEGATIVE Final    Comment: (NOTE) SARS-CoV-2 target nucleic acids are NOT DETECTED.  The SARS-CoV-2 RNA is generally detectable in upper respiratory specimens during  the acute phase of infection. The lowest concentration of SARS-CoV-2 viral copies this assay can detect is 138 copies/mL. A negative result does not preclude SARS-Cov-2 infection and should not be used as the sole basis for treatment or other patient management decisions. A negative result may occur with  improper specimen collection/handling, submission of specimen other than nasopharyngeal swab, presence of viral mutation(s) within the areas targeted by this assay, and inadequate number of viral copies(<138 copies/mL). A negative result must be combined with clinical observations, patient history, and epidemiological information. The expected result is Negative.  Fact Sheet for Patients:  EntrepreneurPulse.com.au  Fact Sheet for Healthcare Providers:  IncredibleEmployment.be  This test is no t yet approved or cleared by the [place] FDA and  has been authorized for detection and/or diagnosis of SARS-CoV-2 by FDA under an Emergency Use Authorization (EUA). This EUA will remain  in effect (meaning this test can be used) for the duration of the COVID-19 declaration under Section 564(b)(1) of the Act, 21 U.S.C.section 360bbb-3(b)(1), unless the authorization is terminated  or revoked sooner.       Influenza A by PCR NEGATIVE NEGATIVE Final   Influenza  B by PCR NEGATIVE NEGATIVE Final    Comment: (NOTE) The Xpert Xpress SARS-CoV-2/FLU/RSV plus assay is intended as an aid in the diagnosis of influenza from Nasopharyngeal swab specimens and should not be used as a sole basis for treatment. Nasal washings and aspirates are unacceptable for Xpert Xpress SARS-CoV-2/FLU/RSV testing.  Fact Sheet for Patients: EntrepreneurPulse.com.au  Fact Sheet for Healthcare Providers: IncredibleEmployment.be  This test is not yet approved or cleared by the [place] FDA and has been authorized for detection and/or  diagnosis of SARS-CoV-2 by FDA under an Emergency Use Authorization (EUA). This EUA will remain in effect (meaning this test can be used) for the duration of the COVID-19 declaration under Section 564(b)(1) of the Act, 21 U.S.C. section 360bbb-3(b)(1), unless the authorization is terminated or revoked.  Performed at [facility], [place] [address], [place], [place] [address]      Labs: CBC: Recent Labs  Lab 07/25/21 1415 07/26/21 1613 07/27/21 0528  WBC 12.7* 12.0* 13.7*  NEUTROABS 10.7* 9.6*  --   HGB 9.5* 9.5* 9.7*  HCT 28.9* 29.1* 29.9*  MCV 93.5 96.4 96.1  PLT 295 276 517    Basic Metabolic Panel: Recent Labs  Lab 07/25/21 1415 07/26/21 1613 07/27/21 0528 07/28/21 0253  NA 137 136 140 140  K 4.1 4.2 3.9 4.1  CL 107 107 109 109  CO2 19* 22 22 24   GLUCOSE 243* 175* 157* 191*  BUN 23 25* 24* 30*  CREATININE 1.46* 1.24* 1.14* 1.13*  CALCIUM 8.5* 8.6* 8.5* 8.8*    Liver Function Tests: Recent Labs  Lab 07/25/21 1415 07/26/21 1613 07/27/21 0528 07/28/21 0253  AST 712* 680* 595* 479*  ALT 1,023* 946* 923* 827*  ALKPHOS 966* 810* 775* 736*  BILITOT 3.0* 3.1* 3.2* 3.0*  PROT 5.7* 5.6* 5.4* 5.3*  ALBUMIN 2.7* 3.0* 2.8* 2.6*     Thyroid function studies Recent Labs    07/25/21 1415  TSH 1.258    I discussed in detail with patient's son via phone, updated care and answered all questions including need for repeat labs on Monday, 07/30/2021.  He verbalized understanding and was appreciative of the call.   Time coordinating discharge: 25 minutes  SIGNED:  Vernell Leep, MD, [place], [facility]. [facility]  To contact the attending provider between 7A-7P or the covering provider during after hours 7P-7A, please log into the web site [email or web address] and access using universal [place] password for that web site. If you do not have the password, please call the hospital operator.

## 2021-07-28 NOTE — Plan of Care (Signed)
Pt will go home this afternoon with her sister-in-law. Alert and oriented.  Plan of care discussed.

## 2021-07-28 NOTE — Evaluation (Signed)
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 fib, dyslipidemia, hypertension, osteoporosis, CVA with right hemiparesis, breast cancer, presented to the [facility] ED on 07/26/2021 upon the advice of Dr. Alen Blew, her oncologist, due to jaundice and progressively worsening liver function  Clinical Impression  The patient is eager to ambulate. Patient has HHPT on board, had just been cleared to ambulate mod I in her home. Patient reports feeling weaker and states son/family  will  be available to ambulate at DC until strength /safety improves. Pt admitted with above diagnosis.  Pt currently with functional limitations due to the deficits listed below (see PT Problem List). Pt will benefit from skilled PT to increase their independence and safety with mobility to allow discharge to the venue listed below.        Recommendations for follow up therapy are one component of a multi-disciplinary discharge planning process, led by the attending physician.  Recommendations may be updated based on patient status, additional functional criteria and insurance authorization.  Follow Up Recommendations Home health PT    Equipment Recommendations  None recommended by PT    Recommendations for Other Services       Precautions / Restrictions Precautions Precautions: Fall Precaution Comments: Pt. reports no recent falls      Mobility  Bed Mobility               General bed mobility comments: in recliner    Transfers Overall transfer level: Needs assistance Equipment used: Rolling walker (2 wheeled) Transfers: Sit to/from Stand   Stand pivot transfers: Min guard;Min  assist       General transfer comment: min assist to power up from low recliner,  supervision from [facility] and use of bar in BR to stand up  Ambulation/Gait Ambulation/Gait assistance: Min guard;Min assist Gait Distance (Feet): 20 Feet (then 120) Assistive device: Rolling walker (2 wheeled) Gait Pattern/deviations: Step-through pattern;Decreased step length - right;Decreased dorsiflexion - right Gait velocity: decr   General Gait Details: Patient  has to place  right hand onto RW grip using Left hand  due to decreased control R UE/ Patient can grip Rw but slides to side and has to stop to reposition several times during distance.  Stairs            Wheelchair Mobility    Modified Rankin (Stroke Patients Only)       Balance Overall balance assessment: Mild deficits observed, not formally tested                                           Pertinent Vitals/Pain      Home Living Family/patient expects to be discharged to:: Private residence Living Arrangements: Children Available Help at Discharge: Available 24 hours/day Type of Home: House Home Access: Stairs to enter;Ramped entrance Entrance Stairs-Rails: Left   Home Layout: Two level;Able to live on main level with bedroom/bathroom Home Equipment: Grab bars - tub/shower;Shower seat;Cane - single point;Walker - 2 wheels;Transport chair Additional Comments: son staying with patient, 24/7 as well as sister    Prior Function Level of Independence: Needs assistance  ADL's / Homemaking Assistance Needed: Has been needing assist with shower transfer. Pt. states she was I with sink bath and dressing.  Comments: family has been doing the majority of adls and meal prep. Has HHPT/OT, ambulating in house mod I recently     Hand Dominance        Extremity/Trunk Assessment   Upper Extremity Assessment RUE Deficits / Details: unable to lift against gravity. aarom 80 degrees. elbow flex 80. aarom wnl  for elbow. wrist ex 10 and aarom 20. able to make flex and ext digits. RUE Sensation: WNL RUE Coordination: decreased fine motor;decreased gross motor    Lower Extremity Assessment Lower Extremity Assessment: RLE deficits/detail RLE Deficits / Details: decreased foot clearance and at times  catches RW RLE Sensation: decreased light touch    Cervical / Trunk Assessment Cervical / Trunk Assessment: Normal  Communication      Cognition Arousal/Alertness: Awake/alert Behavior During Therapy: WFL for tasks assessed/performed                                   General Comments: slowed process      General Comments      Exercises     Assessment/Plan    PT Assessment Patient needs continued PT services  PT Problem List Decreased strength;Decreased mobility;Decreased coordination;Decreased balance;Impaired sensation       PT Treatment Interventions DME instruction;Therapeutic activities;Gait training;Therapeutic exercise;Patient/family education;Functional mobility training    PT Goals (Current goals can be found in the Care Plan section)  Acute Rehab PT Goals Patient Stated Goal: go home PT Goal Formulation: With patient Time For Goal Achievement: 08/11/21 Potential to Achieve Goals: Good    Frequency Min 3X/week   Barriers to discharge        Co-evaluation               AM-PAC PT "6 Clicks" Mobility  Outcome Measure Help needed turning from your back to your side while in a flat bed without using bedrails?: None Help needed moving from lying on your back to sitting on the side of a flat bed without using bedrails?: None Help needed moving to and from a bed to a chair (including a wheelchair)?: A Little Help needed standing up from a chair using your arms (e.g., wheelchair or bedside chair)?: A Little Help needed to walk in hospital room?: A Little Help needed climbing 3-5 steps with a railing? : A Little 6 Click Score: 20    End of Session    Activity Tolerance: Patient tolerated treatment well Patient left: in chair;with call bell/phone within reach Nurse Communication: Mobility status PT Visit Diagnosis: Unsteadiness on feet (R26.81);Muscle weakness (generalized) (M62.81);Other symptoms and signs involving the nervous system (R29.898)    Time: 8850-2774 PT Time Calculation (min) (ACUTE ONLY): 30 min   Charges:   PT Evaluation $PT Eval Low Complexity: 1 Low PT Treatments $Gait Training: 8-22 mins        Tresa Endo PT Acute Rehabilitation Services Pager [phone] Office [phone]   Claretha Cooper 07/28/2021, 1:53 PM

## 2021-07-28 NOTE — Progress Notes (Signed)
Once PAC is deaccessed, pt will leave this afternoon with her sister-in-law. Alert and oriented. Discharge instructions given/explained with pt verbalizing understanding.  All labs have been drawn by IV team. Pt aware of followup appointments.

## 2021-07-28 NOTE — Progress Notes (Signed)
Progress Note   Subjective  Patient feels fine without any symptoms. No abdominal pain. Mental status stable. She inquires about the plan moving forward.   Objective   Vital signs in last 24 hours: Temp:  [97.5 F (36.4 C)-98.2 F (36.8 C)] 97.5 F (36.4 C) (10/21 2017) Pulse Rate:  [70-88] 88 (10/21 2017) Resp:  [16-18] 16 (10/21 2017) BP: (119-129)/(60-64) 119/60 (10/21 2017) SpO2:  [99 %-100 %] 100 % (10/21 2017)   General:    white female in NAD Abdomen:  Soft, nontender and nondistended.  Neurologic:  Alert and oriented,  grossly normal neurologically. No asterixis Psych:  Cooperative. Normal mood and affect.  Intake/Output from previous day: 10/21 0701 - 10/22 0700 In: 210 [P.O.:210] Out: 1 [Urine:1] Intake/Output this shift: Total I/O In: 240 [P.O.:240] Out: -   Lab Results: Recent Labs    07/25/21 1415 07/26/21 1613 07/27/21 0528  WBC 12.7* 12.0* 13.7*  HGB 9.5* 9.5* 9.7*  HCT 28.9* 29.1* 29.9*  PLT 295 276 286   BMET Recent Labs    07/26/21 1613 07/27/21 0528 07/28/21 0253  NA 136 140 140  K 4.2 3.9 4.1  CL 107 109 109  CO2 22 22 24   GLUCOSE 175* 157* 191*  BUN 25* 24* 30*  CREATININE 1.24* 1.14* 1.13*  CALCIUM 8.6* 8.5* 8.8*   LFT Recent Labs    07/28/21 0253  PROT 5.3*  ALBUMIN 2.6*  AST 479*  ALT 827*  ALKPHOS 736*  BILITOT 3.0*   PT/INR Recent Labs    07/26/21 1613 07/28/21 0253  LABPROT 15.0 15.7*  INR 1.2 1.3*    Studies/Results: MR 3D Recon At Scanner  Result Date: 07/27/2021 CLINICAL DATA:  Metastatic melanoma including intracranial metastatic disease. Jaundice with progressive worsening liver function tests, possible autoimmune hepatitis EXAM: MRI ABDOMEN WITHOUT AND WITH CONTRAST (INCLUDING MRCP) TECHNIQUE: Multiplanar multisequence MR imaging of the abdomen was performed both before and after the administration of intravenous contrast. Heavily T2-weighted images of the biliary and pancreatic ducts were  obtained, and three-dimensional MRCP images were rendered by post processing. CONTRAST:  10mL GADAVIST GADOBUTROL 1 MMOL/ML IV SOLN COMPARISON:  07/19/2021 CT scan FINDINGS: Despite efforts by the technologist and patient, motion artifact is present on today's exam and could not be eliminated. This reduces exam sensitivity and specificity. Lower chest: Moderate right and small left pleural effusions, without enhancement along the parietal pleural surface to specifically indicate exudative effusion, an without obvious nodularity along the visible pleura. Suspected postlumpectomy findings in the right breast. Hepatobiliary: No substantial hepatic steatosis. Stable chronic mild prominence of the posterior segment right hepatic lobe without overt hepatomegaly, the morphology does not appear substantially changed from 07/16/2019. Gallbladder unremarkable. Trace fluid along the hepatic capsular margin adjacent to the gallbladder fossa for example on image 22 series 3. No specific regions of infiltrative accentuated T2 signal or delayed enhancement in the liver to indicate fibrosis. No compelling indicators of cirrhosis. No substantial heterogeneity of enhancement in the liver or notably enlarged porta hepatis lymph nodes. No significant focal enhancing lesions in the liver to indicate metastatic disease to the liver. No biliary dilatation. Motion artifact degradation of the MRCP images. Pancreas:  Unremarkable; no findings of autoimmune pancreatitis. Spleen:  Unremarkable Adrenals/Urinary Tract: 1.1 cm left kidney upper pole renal cyst anteriorly. Adrenal glands unremarkable. Previous right hydronephrosis and hydroureter has mostly resolved, with only some mild fullness of the right renal pelvis currently. Stomach/Bowel: Prominent stool throughout the colon favors constipation.  Vascular/Lymphatic: Atherosclerosis is present, including aortoiliac atherosclerotic disease. No pathologic adenopathy is identified. Other:  Scattered subcutaneous edema may reflect third spacing of fluid related to the patient's hypoalbuminemia/hypoproteinemia. No substantial upper abdominal ascites. Musculoskeletal: Mild dextroconvex lumbar scoliosis with rotary component. Small hemangiomas eccentric to the right in the T12 and L2 vertebral bodies. There is evidence of lumbar spondylosis and degenerative disc disease most notably at L4-5 there is degenerative anterolisthesis. IMPRESSION: 1. Although autoimmune hepatitis can be present with normal appearing liver by imaging, common correlative imaging criteria such as hepatic steatosis, hepatic fibrosis, enlarged porta hepatis lymph nodes, intrahepatic biliary dilatation, heterogeneous and heterogeneous hepatic parenchymal enhancement are absent in this case. 2. Moderate right and small left pleural effusions along with subcutaneous edema which may reflect third spacing of fluids. No substantial upper abdominal ascites although there may be trace fluid along the capsular margin of the liver adjacent to the gallbladder fossa. 3. Previous right hydronephrosis and hydroureter has essentially resolved, with only mild residual fullness of the right renal pelvis. 4.  Prominent stool throughout the colon favors constipation. 5.  Aortic Atherosclerosis (ICD10-I70.0). 6. Lumbar scoliosis, spondylosis, and degenerative disc disease, potentially causing impingement at L4-5. Electronically Signed   By: Van Clines M.D.   On: 07/27/2021 14:25   MR ABDOMEN MRCP W WO CONTAST  Result Date: 07/27/2021 CLINICAL DATA:  Metastatic melanoma including intracranial metastatic disease. Jaundice with progressive worsening liver function tests, possible autoimmune hepatitis EXAM: MRI ABDOMEN WITHOUT AND WITH CONTRAST (INCLUDING MRCP) TECHNIQUE: Multiplanar multisequence MR imaging of the abdomen was performed both before and after the administration of intravenous contrast. Heavily T2-weighted images of the biliary  and pancreatic ducts were obtained, and three-dimensional MRCP images were rendered by post processing. CONTRAST:  9mL GADAVIST GADOBUTROL 1 MMOL/ML IV SOLN COMPARISON:  07/19/2021 CT scan FINDINGS: Despite efforts by the technologist and patient, motion artifact is present on today's exam and could not be eliminated. This reduces exam sensitivity and specificity. Lower chest: Moderate right and small left pleural effusions, without enhancement along the parietal pleural surface to specifically indicate exudative effusion, an without obvious nodularity along the visible pleura. Suspected postlumpectomy findings in the right breast. Hepatobiliary: No substantial hepatic steatosis. Stable chronic mild prominence of the posterior segment right hepatic lobe without overt hepatomegaly, the morphology does not appear substantially changed from 07/16/2019. Gallbladder unremarkable. Trace fluid along the hepatic capsular margin adjacent to the gallbladder fossa for example on image 22 series 3. No specific regions of infiltrative accentuated T2 signal or delayed enhancement in the liver to indicate fibrosis. No compelling indicators of cirrhosis. No substantial heterogeneity of enhancement in the liver or notably enlarged porta hepatis lymph nodes. No significant focal enhancing lesions in the liver to indicate metastatic disease to the liver. No biliary dilatation. Motion artifact degradation of the MRCP images. Pancreas:  Unremarkable; no findings of autoimmune pancreatitis. Spleen:  Unremarkable Adrenals/Urinary Tract: 1.1 cm left kidney upper pole renal cyst anteriorly. Adrenal glands unremarkable. Previous right hydronephrosis and hydroureter has mostly resolved, with only some mild fullness of the right renal pelvis currently. Stomach/Bowel: Prominent stool throughout the colon favors constipation. Vascular/Lymphatic: Atherosclerosis is present, including aortoiliac atherosclerotic disease. No pathologic adenopathy is  identified. Other: Scattered subcutaneous edema may reflect third spacing of fluid related to the patient's hypoalbuminemia/hypoproteinemia. No substantial upper abdominal ascites. Musculoskeletal: Mild dextroconvex lumbar scoliosis with rotary component. Small hemangiomas eccentric to the right in the T12 and L2 vertebral bodies. There is evidence of lumbar spondylosis and degenerative  disc disease most notably at L4-5 there is degenerative anterolisthesis. IMPRESSION: 1. Although autoimmune hepatitis can be present with normal appearing liver by imaging, common correlative imaging criteria such as hepatic steatosis, hepatic fibrosis, enlarged porta hepatis lymph nodes, intrahepatic biliary dilatation, heterogeneous and heterogeneous hepatic parenchymal enhancement are absent in this case. 2. Moderate right and small left pleural effusions along with subcutaneous edema which may reflect third spacing of fluids. No substantial upper abdominal ascites although there may be trace fluid along the capsular margin of the liver adjacent to the gallbladder fossa. 3. Previous right hydronephrosis and hydroureter has essentially resolved, with only mild residual fullness of the right renal pelvis. 4.  Prominent stool throughout the colon favors constipation. 5.  Aortic Atherosclerosis (ICD10-I70.0). 6. Lumbar scoliosis, spondylosis, and degenerative disc disease, potentially causing impingement at L4-5. Electronically Signed   By: Van Clines M.D.   On: 07/27/2021 14:25       Assessment / Plan:    80 y/o female with a history of metastatic melanoma, previously on Keytruda (last dose end of June) and then started on ipilimumab and nivolumab mid Sept, admitted with worsening transaminitis / rise in AP / bili. In looking through her labs previously, she had a AP of 485 and ALT of 194 around the time she started on her newest regimen - unclear if that prior elevation was related to [facility] at all and then she got  intervally worse on the other newer regimen, as all of these drugs carry significant risk for hepatotoxicity. Her ALT peaked > 1000 about 3 days ago, ALT has downtrended each day since in hospital, now 827, and AST has downtrended as well to 479. Bili around 3 and AP also downtrending. The only change she has had since admission has been IV steroids. Viral hep negative so far, autoimmune serologies pending, but with normal IgG value, true AIH seems less likely. While her enzymes remain elevated, she has a clear downtrend over the past 3 days, there is no evidence of encephalopathy. INR at 1.3 today. I suspect drug induced liver injury from her immunotherapy is most likely, MRCP/MRI liver is otherwise negative for metastatic disease. She is on acyclovir at baseline. Agree with continuing steroids at this point which is treatment of choice for immune checkpoint inhibitor related hepatotoxicity. Given she has a clear downtrend I think okay to be discharged home today as she otherwise feels well. Would plan for close outpatient follow up with labs on Monday AM at our office (Dr. Loletha Carrow primary GI), which I will coordinate. As long as enzymes continue to downtrend on steroids we will continue and slowly taper steroids, this may take several weeks to a few months normalize. If the enzymes fail to come down or rise again over time, would then recommend a liver biopsy. Would hold off on that for now as long as she is doing better. I spoke with the patient and Dr. Algis Liming, primary service, about the plan. She agrees.  Plan: - transition to prednisone 60mg  / day (almost 1mg /kg) today - will add a few more serologies to complete workup - okay for discharge home today - repeat labs on Monday AM. If she continues to do well with enzymes downtrending on steroids will continue prednisone taper. If enzymes persistently remain elevated or rise, will then need to consider liver biopsy  Jolly Mango, MD [facility]  Gastroenterology

## 2021-07-28 NOTE — Discharge Instructions (Signed)

## 2021-07-29 LAB — EBV AB TO VIRAL CAPSID AG PNL, IGG+IGM
EBV VCA IgG: 25.2 U/mL — ABNORMAL HIGH (ref 0.0–17.9)
EBV VCA IgM: <36 U/mL (ref 0.0–35.9)

## 2021-07-29 LAB — ANTI-SMOOTH MUSCLE ANTIBODY, IGG: F-Actin IgG: 4 Units (ref 0–19)

## 2021-07-29 LAB — MITOCHONDRIAL ANTIBODIES: Mitochondrial M2 Ab, IgG: <20 Units (ref 0.0–20.0)

## 2021-07-30 ENCOUNTER — Other Ambulatory Visit: Payer: Self-pay

## 2021-07-30 ENCOUNTER — Telehealth: Payer: Self-pay

## 2021-07-30 ENCOUNTER — Telehealth: Payer: Self-pay | Admitting: Gastroenterology

## 2021-07-30 ENCOUNTER — Inpatient Hospital Stay: Payer: Medicare Other

## 2021-07-30 ENCOUNTER — Other Ambulatory Visit: Payer: Self-pay | Admitting: *Deleted

## 2021-07-30 ENCOUNTER — Telehealth: Payer: Self-pay | Admitting: *Deleted

## 2021-07-30 ENCOUNTER — Encounter: Payer: Self-pay | Admitting: Oncology

## 2021-07-30 DIAGNOSIS — R7989 Other specified abnormal findings of blood chemistry: Secondary | ICD-10-CM

## 2021-07-30 DIAGNOSIS — Z7952 Long term (current) use of systemic steroids: Secondary | ICD-10-CM | POA: Diagnosis not present

## 2021-07-30 DIAGNOSIS — E039 Hypothyroidism, unspecified: Secondary | ICD-10-CM

## 2021-07-30 DIAGNOSIS — C211 Malignant neoplasm of anal canal: Secondary | ICD-10-CM | POA: Diagnosis not present

## 2021-07-30 DIAGNOSIS — R945 Abnormal results of liver function studies: Secondary | ICD-10-CM

## 2021-07-30 DIAGNOSIS — N1330 Unspecified hydronephrosis: Secondary | ICD-10-CM | POA: Diagnosis not present

## 2021-07-30 DIAGNOSIS — Z79899 Other long term (current) drug therapy: Secondary | ICD-10-CM | POA: Diagnosis not present

## 2021-07-30 DIAGNOSIS — K754 Autoimmune hepatitis: Secondary | ICD-10-CM | POA: Diagnosis not present

## 2021-07-30 DIAGNOSIS — R569 Unspecified convulsions: Secondary | ICD-10-CM | POA: Insufficient documentation

## 2021-07-30 DIAGNOSIS — R748 Abnormal levels of other serum enzymes: Secondary | ICD-10-CM

## 2021-07-30 DIAGNOSIS — C7931 Secondary malignant neoplasm of brain: Secondary | ICD-10-CM | POA: Diagnosis not present

## 2021-07-30 DIAGNOSIS — Z9221 Personal history of antineoplastic chemotherapy: Secondary | ICD-10-CM | POA: Diagnosis not present

## 2021-07-30 LAB — CMP (CANCER CENTER ONLY)
ALT: 812 U/L (ref 0–44)
AST: 334 U/L (ref 15–41)
Albumin: 3.1 g/dL — ABNORMAL LOW (ref 3.5–5.0)
Alkaline Phosphatase: 808 U/L — ABNORMAL HIGH (ref 38–126)
Anion gap: 9 (ref 5–15)
BUN: 30 mg/dL — ABNORMAL HIGH (ref 8–23)
CO2: 21 mmol/L — ABNORMAL LOW (ref 22–32)
Calcium: 8.9 mg/dL (ref 8.9–10.3)
Chloride: 105 mmol/L (ref 98–111)
Creatinine: 1.1 mg/dL — ABNORMAL HIGH (ref 0.44–1.00)
GFR, Estimated: 52 mL/min — ABNORMAL LOW (ref >60)
Glucose, Bld: 125 mg/dL — ABNORMAL HIGH (ref 70–99)
Potassium: 3.9 mmol/L (ref 3.5–5.1)
Sodium: 135 mmol/L (ref 135–145)
Total Bilirubin: 3.7 mg/dL (ref 0.3–1.2)
Total Protein: 5.7 g/dL — ABNORMAL LOW (ref 6.5–8.1)

## 2021-07-30 LAB — CMV DNA, QUANTITATIVE, PCR
CMV DNA Quant: NEGATIVE IU/mL
Log10 CMV Qn DNA Pl: UNDETERMINED log10 IU/mL

## 2021-07-30 LAB — CBC WITH DIFFERENTIAL (CANCER CENTER ONLY)
Abs Immature Granulocytes: 0.28 10*3/uL — ABNORMAL HIGH (ref 0.00–0.07)
Basophils Absolute: 0 10*3/uL (ref 0.0–0.1)
Basophils Relative: 0 %
Eosinophils Absolute: 0.1 10*3/uL (ref 0.0–0.5)
Eosinophils Relative: 1 %
HCT: 30.7 % — ABNORMAL LOW (ref 36.0–46.0)
Hemoglobin: 10.4 g/dL — ABNORMAL LOW (ref 12.0–15.0)
Immature Granulocytes: 2 %
Lymphocytes Relative: 9 %
Lymphs Abs: 1.6 10*3/uL (ref 0.7–4.0)
MCH: 31.5 pg (ref 26.0–34.0)
MCHC: 33.9 g/dL (ref 30.0–36.0)
MCV: 93 fL (ref 80.0–100.0)
Monocytes Absolute: 2 10*3/uL — ABNORMAL HIGH (ref 0.1–1.0)
Monocytes Relative: 11 %
Neutro Abs: 13.9 10*3/uL — ABNORMAL HIGH (ref 1.7–7.7)
Neutrophils Relative %: 77 %
Platelet Count: 267 10*3/uL (ref 150–400)
RBC: 3.3 MIL/uL — ABNORMAL LOW (ref 3.87–5.11)
RDW: 18.1 % — ABNORMAL HIGH (ref 11.5–15.5)
WBC Count: 17.9 10*3/uL — ABNORMAL HIGH (ref 4.0–10.5)
nRBC: 0 % (ref 0.0–0.2)

## 2021-07-30 LAB — ANTI-MICROSOMAL ANTIBODY LIVER / KIDNEY: LKM1 Ab: 0.8 Units (ref 0.0–20.0)

## 2021-07-30 LAB — TSH: TSH: 5.249 u[IU]/mL — ABNORMAL HIGH (ref 0.350–4.500)

## 2021-07-30 MED ORDER — TAMSULOSIN HCL 0.4 MG PO CAPS: 0.4000 mg | ORAL_CAPSULE | Freq: Every day | ORAL | 0 refills | Status: DC

## 2021-07-30 NOTE — Telephone Encounter (Signed)
Repeat labs scheduled and completed today.  AST and ALT are still critical but are improving.   Total Bil has increased and is now reporting critical routed to Dr Alen Blew .

## 2021-07-30 NOTE — Telephone Encounter (Signed)
Patient is scheduled to have labs drawn this afternoon at [facility]. Will await results.

## 2021-07-30 NOTE — Progress Notes (Unsigned)
TB 3.7  AST 334 ALT 812 CALLED TO KIM SMITH AT 1601 BY HFLYNT 07/30/21

## 2021-07-30 NOTE — Telephone Encounter (Signed)
Called the patient and spoke with her and her son about labs today.  ALT and AST slightly down. AP slightly up, bili slightly up. She is on prednisone 60mg  / day for suspected drug induced liver injury from her immunotherapy - see inpatient GI consult note for full details of her case. She has had lab workup for other viral causes which have been negative, and recent imaging with MRCP/MRI which has cleared her ducts and vasculature - no thrombus, and no metastatic disease to the liver. She continues to feel well without complaints.   Hopefully with more time her liver enzymes improve, bili could just be lagging behind AST/ALT. Will plan on continuing prednisone at 60mg  / day, repeat LFTs in 48 hours (Wed PM) along with INR. If things don't improve with a bit more time may need to consider liver biopsy. Patient and son agreed with the plan.   Angela Carpenter

## 2021-07-30 NOTE — Telephone Encounter (Signed)
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 She did well on IV steroids and sending her home today, clear downtrend in LFTs over 3 days but her enzymes remain high. I have ordered LFTs to be drawn Monday AM. I see you are off, so I will keep an eye out for those as well. As long as LFTs continue to downtrend the plan is to taper off steroids over time. If they do not or rise, then liver biopsy and perhaps hepatology consult. Can chat more about it when you are back in the office next week.   [place], [place]. I am also off on Monday/Tuesday when this patient is getting her labs. I will keep an eye out for them but please do so as well and can all me with any questions, I am in the area.  Angela Carpenter

## 2021-07-31 LAB — ANA W/REFLEX IF POSITIVE: Anti Nuclear Antibody (ANA): NEGATIVE

## 2021-08-01 ENCOUNTER — Other Ambulatory Visit (INDEPENDENT_AMBULATORY_CARE_PROVIDER_SITE_OTHER): Payer: Medicare Other

## 2021-08-01 DIAGNOSIS — R945 Abnormal results of liver function studies: Secondary | ICD-10-CM | POA: Diagnosis not present

## 2021-08-01 DIAGNOSIS — R748 Abnormal levels of other serum enzymes: Secondary | ICD-10-CM | POA: Diagnosis not present

## 2021-08-01 LAB — PROTIME-INR
INR: 1 ratio (ref 0.8–1.0)
Prothrombin Time: 11.2 s (ref 9.6–13.1)

## 2021-08-01 LAB — HEPATIC FUNCTION PANEL
ALT: 782 U/L — ABNORMAL HIGH (ref 0–35)
AST: 279 U/L — ABNORMAL HIGH (ref 0–37)
Albumin: 3.6 g/dL (ref 3.5–5.2)
Alkaline Phosphatase: 776 U/L — ABNORMAL HIGH (ref 39–117)
Bilirubin, Direct: 2.2 mg/dL — ABNORMAL HIGH (ref 0.0–0.3)
Total Bilirubin: 4.2 mg/dL — ABNORMAL HIGH (ref 0.2–1.2)
Total Protein: 6.1 g/dL (ref 6.0–8.3)

## 2021-08-01 NOTE — Telephone Encounter (Signed)
Please call her and give her the good news that the liver labs are almost all coming down.  The bilirubin level has risen a little from 3.7 to 4.2, which is typical in situations like this.  The bilirubin will typically peak and fall later than the other liver labs.  Most importantly, the INR, which is a measure of overall liver synthetic function, is normal.  My advice is that she have a hepatic function panel and INR next Monday, 08/06/2021 because she is scheduled to see me in clinic at the end of that day.  She can either go for those labs that morning or come an hour or so before her visit and have them run stat if they are ordered that way.  Continue current dose of prednisone.  - HD

## 2021-08-01 NOTE — Telephone Encounter (Signed)
[name],  Thank you for attending to this patient while I was out of the office.  I reviewed your consult note and recent laboratory studies.  I will follow-up on the 07/31/2021 labs, and then contact the patient with further recommendations.  She is scheduled to see me in the office on 08/06/2021.  - HD

## 2021-08-02 ENCOUNTER — Other Ambulatory Visit: Payer: Self-pay

## 2021-08-02 ENCOUNTER — Inpatient Hospital Stay: Payer: Medicare Other

## 2021-08-02 ENCOUNTER — Inpatient Hospital Stay (HOSPITAL_BASED_OUTPATIENT_CLINIC_OR_DEPARTMENT_OTHER): Payer: Medicare Other | Admitting: Oncology

## 2021-08-02 VITALS — BP 145/67 | HR 88 | Temp 96.8°F | Resp 16

## 2021-08-02 DIAGNOSIS — C211 Malignant neoplasm of anal canal: Secondary | ICD-10-CM

## 2021-08-02 DIAGNOSIS — N1330 Unspecified hydronephrosis: Secondary | ICD-10-CM | POA: Diagnosis not present

## 2021-08-02 DIAGNOSIS — K754 Autoimmune hepatitis: Secondary | ICD-10-CM

## 2021-08-02 DIAGNOSIS — C7931 Secondary malignant neoplasm of brain: Secondary | ICD-10-CM | POA: Diagnosis not present

## 2021-08-02 DIAGNOSIS — Z9221 Personal history of antineoplastic chemotherapy: Secondary | ICD-10-CM | POA: Diagnosis not present

## 2021-08-02 DIAGNOSIS — E039 Hypothyroidism, unspecified: Secondary | ICD-10-CM

## 2021-08-02 DIAGNOSIS — R7989 Other specified abnormal findings of blood chemistry: Secondary | ICD-10-CM | POA: Diagnosis not present

## 2021-08-02 DIAGNOSIS — Z95828 Presence of other vascular implants and grafts: Secondary | ICD-10-CM

## 2021-08-02 LAB — CMP (CANCER CENTER ONLY)
ALT: 758 U/L (ref 0–44)
AST: 237 U/L (ref 15–41)
Albumin: 2.8 g/dL — ABNORMAL LOW (ref 3.5–5.0)
Alkaline Phosphatase: 731 U/L — ABNORMAL HIGH (ref 38–126)
Anion gap: 9 (ref 5–15)
BUN: 25 mg/dL — ABNORMAL HIGH (ref 8–23)
CO2: 24 mmol/L (ref 22–32)
Calcium: 9.3 mg/dL (ref 8.9–10.3)
Chloride: 104 mmol/L (ref 98–111)
Creatinine: 0.99 mg/dL (ref 0.44–1.00)
GFR, Estimated: 59 mL/min — ABNORMAL LOW (ref >60)
Glucose, Bld: 150 mg/dL — ABNORMAL HIGH (ref 70–99)
Potassium: 4.1 mmol/L (ref 3.5–5.1)
Sodium: 137 mmol/L (ref 135–145)
Total Bilirubin: 3.8 mg/dL (ref 0.3–1.2)
Total Protein: 5.6 g/dL — ABNORMAL LOW (ref 6.5–8.1)

## 2021-08-02 LAB — CBC WITH DIFFERENTIAL (CANCER CENTER ONLY)
Abs Immature Granulocytes: 0.3 10*3/uL — ABNORMAL HIGH (ref 0.00–0.07)
Basophils Absolute: 0 10*3/uL (ref 0.0–0.1)
Basophils Relative: 0 %
Eosinophils Absolute: 0.1 10*3/uL (ref 0.0–0.5)
Eosinophils Relative: 1 %
HCT: 30.2 % — ABNORMAL LOW (ref 36.0–46.0)
Hemoglobin: 10 g/dL — ABNORMAL LOW (ref 12.0–15.0)
Immature Granulocytes: 2 %
Lymphocytes Relative: 8 %
Lymphs Abs: 1.6 10*3/uL (ref 0.7–4.0)
MCH: 30.7 pg (ref 26.0–34.0)
MCHC: 33.1 g/dL (ref 30.0–36.0)
MCV: 92.6 fL (ref 80.0–100.0)
Monocytes Absolute: 2 10*3/uL — ABNORMAL HIGH (ref 0.1–1.0)
Monocytes Relative: 10 %
Neutro Abs: 15.3 10*3/uL — ABNORMAL HIGH (ref 1.7–7.7)
Neutrophils Relative %: 79 %
Platelet Count: 253 10*3/uL (ref 150–400)
RBC: 3.26 MIL/uL — ABNORMAL LOW (ref 3.87–5.11)
RDW: 18.2 % — ABNORMAL HIGH (ref 11.5–15.5)
WBC Count: 19.2 10*3/uL — ABNORMAL HIGH (ref 4.0–10.5)
nRBC: 0 % (ref 0.0–0.2)

## 2021-08-02 LAB — TSH: TSH: 1.952 u[IU]/mL (ref 0.308–3.960)

## 2021-08-02 MED ORDER — SODIUM CHLORIDE 0.9% FLUSH
10.0000 mL | Freq: Once | INTRAVENOUS | Status: AC
  Administered 2021-08-02: 10 mL

## 2021-08-02 NOTE — Addendum Note (Signed)
Addended by: Yevette Edwards on: 08/02/2021 08:34 AM   Modules accepted: Orders

## 2021-08-02 NOTE — Telephone Encounter (Addendum)
Spoke with patient's son, [name], in regards to lab results and recommendations. [name] will bring patient in prior to her appt on Monday, 08/06/21. Scott verbalized understanding of all information and had no concerns at the end of the call.   Lab orders in epic.

## 2021-08-02 NOTE — Addendum Note (Signed)
Addended by: Yevette Edwards on: 08/02/2021 08:41 AM   Modules accepted: Orders

## 2021-08-02 NOTE — Progress Notes (Signed)
Hematology and Oncology Follow Up Visit  Angela Carpenter [id number] 1940-11-03 80 y.o. 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  Prior Therapy:   She is status post perianal excision in August 2020.  Stereotactic radiosurgery completed on December 28, 2020.  He received 18 Gray in 1 fraction.  Pembrolizumab 200 mg every 3 weeks started on July 29, 2019.  Last treatment given on November 08, 2020.  She is status post left frontal craniotomy and resection of her residual melanoma completed on May 01, 2021.  Pembrolizumab 400 mg every 6 weeks started on Feb 15, 2021.  Last treatment given on March 29, 2021.  Stereotactic radiosurgery started on September 6.  She completed treatment on 06/22/2021.  Current therapy:     Ipilimumab 1 mg/kg with nivolumab 3 mg/kg started on 06/21/2021.  She is here for cycle 2 of therapy.  Therapy on hold due to presumed hepatotoxicity.   Interim History: Angela Carpenter returns today for a follow-up visit.  Since her last visit, she feels reasonably better and improved overall functional status.  She is participating more in physical and occupational therapy.  She has not reported any abdominal pain or discomfort.  She denies any hematochezia or melena.  She denies any discoloration of her urine.     Medications: Updated on review. Current Outpatient Medications  Medication Sig Dispense Refill   acyclovir (ZOVIRAX) 400 MG tablet Take 1 tablet (400 mg total) by mouth 2 (two) times daily. 180 tablet 2   aspirin 81 MG EC tablet 1 tablet     atorvastatin (LIPITOR) 20 MG tablet Take 1 tablet by mouth daily.     bethanechol (URECHOLINE) 50 MG tablet Take 1 tablet (50 mg total) by mouth 3 (three) times daily. 180 tablet 2   bisacodyl (DULCOLAX) 10 MG  suppository Place 1 suppository (10 mg total) rectally daily as needed for moderate constipation. 12 suppository 0   camphor-menthol (SARNA) lotion Apply topically as needed for itching. (Patient taking differently: Apply 1 application topically daily as needed for itching.) 222 mL 0   cetirizine (ZYRTEC) 10 MG tablet Take 10 mg by mouth 2 (two) times daily.     Cholecalciferol (VITAMIN D3) 250 MCG (10000 UT) capsule 1 tablet     hydrOXYzine (ATARAX/VISTARIL) 25 MG tablet Take 25 mg by mouth 3 (three) times daily.     levETIRAcetam (KEPPRA) 500 MG tablet 1 tablet     metoprolol tartrate (LOPRESSOR) 25 MG tablet Take 1 tablet by mouth 2 (two) times daily.     Multiple Vitamins-Minerals (PRESERVISION AREDS 2+MULTI VIT) CAPS Take 1 capsule by mouth 2 (two) times daily.     pantoprazole (PROTONIX) 40 MG tablet Take 1 tablet (40 mg total) by mouth at bedtime. 90 tablet 2   polyethylene glycol (MIRALAX / GLYCOLAX) 17 g packet Take 17 g by mouth 2 (two) times daily. 60 each 0   predniSONE (DELTASONE) 20 MG tablet Take 3 tablets (60 mg total) by mouth daily. 30 tablet 1   raloxifene (EVISTA) 60 MG tablet Take 1 tablet by mouth daily.     senna-docusate (SENOKOT-S) 8.6-50 MG tablet Take 1 tablet by mouth 2 (two) times daily. 60 tablet 0   tamsulosin (FLOMAX) 0.4 MG CAPS capsule Take 1 capsule (0.4 mg total) by mouth daily  after supper. 30 capsule 0   timolol (TIMOPTIC) 0.5 % ophthalmic solution Place 1 drop into both eyes 2 (two) times daily. 30 mL 2   No current facility-administered medications for this visit.     Allergies: No Known Allergies     Physical Exam:     Blood pressure (!) 145/67, pulse 88, temperature (!) 96.8 F (36 C), temperature source Temporal, resp. rate 16, SpO2 100 %.      ECOG: 1    General appearance: Comfortable appearing without any discomfort Head: Normocephalic without any trauma Oropharynx: Mucous membranes are moist and pink without any thrush or  ulcers. Eyes: Pupils are equal and round reactive to light. Lymph nodes: No cervical, supraclavicular, inguinal or axillary lymphadenopathy.   Heart:regular rate and rhythm.  S1 and S2 without leg edema. Lung: Clear without any rhonchi or wheezes.  No dullness to percussion. Abdomin: Soft, nontender, nondistended with good bowel sounds.  No hepatosplenomegaly. Musculoskeletal: No joint deformity or effusion.  Full range of motion noted. Neurological: No deficits noted on motor, sensory and deep tendon reflex exam. Skin: No petechial rash or dryness.  Appeared moist.                                   Lab Results: Lab Results  Component Value Date   WBC 17.9 (H) 07/30/2021   HGB 10.4 (L) 07/30/2021   HCT 30.7 (L) 07/30/2021   MCV 93.0 07/30/2021   PLT 267 07/30/2021     Chemistry      Component Value Date/Time   NA 135 07/30/2021 1433   NA 138 12/05/2016 0750   K 3.9 07/30/2021 1433   CL 105 07/30/2021 1433   CO2 21 (L) 07/30/2021 1433   BUN 30 (H) 07/30/2021 1433   BUN 15 12/05/2016 0750   CREATININE 1.10 (H) 07/30/2021 1433   CREATININE 0.70 09/05/2015 0828      Component Value Date/Time   CALCIUM 8.9 07/30/2021 1433   ALKPHOS 776 (H) 08/01/2021 1131   AST 279 (H) 08/01/2021 1131   AST 334 (HH) 07/30/2021 1433   ALT 782 (H) 08/01/2021 1131   ALT 812 (HH) 07/30/2021 1433   BILITOT 4.2 (H) 08/01/2021 1131   BILITOT 3.7 ([place]) 07/30/2021 1433     IMPRESSION: No evidence of new metastatic disease in the abdomen or pelvis.   New moderate right hydronephrosis and hydroureter with no obstructing stone or mass visualized. Thick-walled and trabeculated urinary bladder suggesting bladder dysfunction.   New centrilobular nodules of the bilateral lower lobes, likely due to infection or aspiration. Recommend attention on follow-up.   Previously seen lingular nodules are not apparent on today's exam.   Previously seen lesion of the posterior left  thigh is not included in the field of view.   Aortic Atherosclerosis (ICD10-I70.0).       Impression and Plan:  80 year old with:   1.   Mucosal melanoma of the anal canal diagnosed in 2020.  She developed stage IV disease with CNS and pelvic lymph node involvement.   Her disease status was updated at this time and imaging studies on a October 13 showed no disease progression.  At this time, recommended to continue with hold immunotherapy given hepatotoxicity.  She is agreeable at this time and will reevaluate whether single or double agent immunotherapy can be reused.   2.  Immune mediated complications: She has experienced a presumed hepatitis related to  immunotherapy has not had any additional complications.   3.  IV access: Port-A-Cath will continue to be flushed periodically.   4.  Antiemetics: No nausea or vomiting reported at this time.  Compazine is available to her.  5.  CNS metastasis: She is status post therapy outlined above without any additional need for treatment.  6.  Autoimmune hepatitis: Laboratory data continues to show improvement in her AST and ALT and late plateau of her bilirubin.  She continues to be on prednisone 60 mg daily and continue to follow with GI.  We will continue to monitor and taper prednisone in the future.  7.   Follow-up: In 3 weeks for repeat follow-up.  30 minutes were spent on this encounter.  The time was dedicated to reviewing laboratory data, imaging studies, treatment choices and future plan of care discussion.   Zola Button, MD 10/27/202210:37 AM

## 2021-08-03 NOTE — Telephone Encounter (Signed)
Task completed

## 2021-08-06 ENCOUNTER — Inpatient Hospital Stay: Payer: Medicare Other

## 2021-08-06 ENCOUNTER — Ambulatory Visit (INDEPENDENT_AMBULATORY_CARE_PROVIDER_SITE_OTHER): Payer: Medicare Other | Admitting: Gastroenterology

## 2021-08-06 ENCOUNTER — Other Ambulatory Visit: Payer: Self-pay

## 2021-08-06 ENCOUNTER — Telehealth: Payer: Self-pay

## 2021-08-06 ENCOUNTER — Encounter: Payer: Self-pay | Admitting: Gastroenterology

## 2021-08-06 VITALS — BP 130/62 | HR 72

## 2021-08-06 DIAGNOSIS — K719 Toxic liver disease, unspecified: Secondary | ICD-10-CM | POA: Diagnosis not present

## 2021-08-06 DIAGNOSIS — R7989 Other specified abnormal findings of blood chemistry: Secondary | ICD-10-CM | POA: Diagnosis not present

## 2021-08-06 DIAGNOSIS — R17 Unspecified jaundice: Secondary | ICD-10-CM

## 2021-08-06 DIAGNOSIS — C7931 Secondary malignant neoplasm of brain: Secondary | ICD-10-CM | POA: Diagnosis not present

## 2021-08-06 DIAGNOSIS — Z9221 Personal history of antineoplastic chemotherapy: Secondary | ICD-10-CM | POA: Diagnosis not present

## 2021-08-06 DIAGNOSIS — E039 Hypothyroidism, unspecified: Secondary | ICD-10-CM

## 2021-08-06 DIAGNOSIS — C211 Malignant neoplasm of anal canal: Secondary | ICD-10-CM

## 2021-08-06 DIAGNOSIS — K754 Autoimmune hepatitis: Secondary | ICD-10-CM

## 2021-08-06 DIAGNOSIS — Z95828 Presence of other vascular implants and grafts: Secondary | ICD-10-CM

## 2021-08-06 DIAGNOSIS — N1330 Unspecified hydronephrosis: Secondary | ICD-10-CM | POA: Diagnosis not present

## 2021-08-06 LAB — CBC WITH DIFFERENTIAL (CANCER CENTER ONLY)
Abs Immature Granulocytes: 0.4 10*3/uL — ABNORMAL HIGH (ref 0.00–0.07)
Basophils Absolute: 0 10*3/uL (ref 0.0–0.1)
Basophils Relative: 0 %
Eosinophils Absolute: 0.1 10*3/uL (ref 0.0–0.5)
Eosinophils Relative: 1 %
HCT: 29.7 % — ABNORMAL LOW (ref 36.0–46.0)
Hemoglobin: 10.2 g/dL — ABNORMAL LOW (ref 12.0–15.0)
Immature Granulocytes: 2 %
Lymphocytes Relative: 9 %
Lymphs Abs: 1.8 10*3/uL (ref 0.7–4.0)
MCH: 31.5 pg (ref 26.0–34.0)
MCHC: 34.3 g/dL (ref 30.0–36.0)
MCV: 91.7 fL (ref 80.0–100.0)
Monocytes Absolute: 1.9 10*3/uL — ABNORMAL HIGH (ref 0.1–1.0)
Monocytes Relative: 10 %
Neutro Abs: 14.7 10*3/uL — ABNORMAL HIGH (ref 1.7–7.7)
Neutrophils Relative %: 78 %
Platelet Count: 249 10*3/uL (ref 150–400)
RBC: 3.24 MIL/uL — ABNORMAL LOW (ref 3.87–5.11)
RDW: 18.2 % — ABNORMAL HIGH (ref 11.5–15.5)
WBC Count: 18.9 10*3/uL — ABNORMAL HIGH (ref 4.0–10.5)
nRBC: 0 % (ref 0.0–0.2)

## 2021-08-06 LAB — CMP (CANCER CENTER ONLY)
ALT: 601 U/L (ref 0–44)
AST: 156 U/L — ABNORMAL HIGH (ref 15–41)
Albumin: 2.9 g/dL — ABNORMAL LOW (ref 3.5–5.0)
Alkaline Phosphatase: 635 U/L — ABNORMAL HIGH (ref 38–126)
Anion gap: 7 (ref 5–15)
BUN: 27 mg/dL — ABNORMAL HIGH (ref 8–23)
CO2: 28 mmol/L (ref 22–32)
Calcium: 8.8 mg/dL — ABNORMAL LOW (ref 8.9–10.3)
Chloride: 102 mmol/L (ref 98–111)
Creatinine: 0.92 mg/dL (ref 0.44–1.00)
GFR, Estimated: >60 mL/min (ref >60)
Glucose, Bld: 98 mg/dL (ref 70–99)
Potassium: 3.8 mmol/L (ref 3.5–5.1)
Sodium: 137 mmol/L (ref 135–145)
Total Bilirubin: 4.1 mg/dL (ref 0.3–1.2)
Total Protein: 5.6 g/dL — ABNORMAL LOW (ref 6.5–8.1)

## 2021-08-06 LAB — TSH: TSH: 2.649 u[IU]/mL (ref 0.308–3.960)

## 2021-08-06 LAB — PROTIME-INR
INR: 1 (ref 0.8–1.2)
Prothrombin Time: 13.3 seconds (ref 11.4–15.2)

## 2021-08-06 MED ORDER — SODIUM CHLORIDE 0.9% FLUSH
10.0000 mL | Freq: Once | INTRAVENOUS | Status: AC
  Administered 2021-08-06: 10 mL

## 2021-08-06 MED ORDER — HEPARIN SOD (PORK) LOCK FLUSH 100 UNIT/ML IV SOLN
500.0000 [IU] | Freq: Once | INTRAVENOUS | Status: AC
  Administered 2021-08-06: 500 [IU]

## 2021-08-06 NOTE — Progress Notes (Signed)
[facility] progress note:  History: Angela Carpenter 08/06/2021  Referring provider: [name], MD  Reason for consult/chief complaint: hx of malignant melanoma of anal canal and elevated LFT's   Subjective  HPI: I last saw Angela Carpenter for a surveillance colonoscopy in November 2017 She was hospitalized last week for severe cholestatic hepatitis and seen in consult by Dr. Havery Moros.  She has metastatic melanoma with CNS involvement, she had been on Keytruda with last dose 03/29/2021, then was on other immune checkpoint inhibitors more recently.  The timing and details of this are outlined in Dr. Doyne Keel note.  And in 06/21/2021 hematology/oncology note as follows (Dr. Alen Blew) : "She is status post perianal excision in August 2020.   Stereotactic radiosurgery completed on December 28, 2020.  He received 18 Gray in 1 fraction.   Pembrolizumab 200 mg every 3 weeks started on July 29, 2019.  Last treatment given on November 08, 2020.   She is status post left frontal craniotomy and resection of her residual melanoma completed on May 01, 2021.   Pembrolizumab 400 mg every 6 weeks started on Feb 15, 2021.  Last treatment given on March 29, 2021.   Current therapy:    Stereotactic radiosurgery started on September 6.  She is scheduled for her fifth treatment on 06/22/2021.   Ipilimumab 1 mg/kg with nivolumab 3 mg/kg to start on 06/21/2021."  After Dr. Doyne Keel initial consult, extensive autoimmune lab work-up negative, MRI abdomen/MRCP ruled out obstructive or vascular cause for this condition.  It was ultimately decided to start her on prednisone 60 mg once a day, she had close follow-up with 2 further outpatient LFT checks last week showing improvement in transaminases and alkaline phosphatase but slowly rising bilirubin.  Thankfully, INR was normal at 1.0 on last check. _________________________________  Angela Carpenter was here with her son today.  I had a long  discussion with them and reassured them I had spoken with Dr. Havery Moros and done a thorough chart review. She says other than the jaundice she really does not have any symptoms from this hepatitis.  She is fatigued and has chronic right arm and leg weakness from the CNS involvement and surgery.  At some point she was on dexamethasone after that until it was switched to prednisone in the hospital.  They are therefore wondering if she should go back on dexamethasone at some point. I also saw her LFTs from today and were concerned that the bilirubin had gone up some.  Angela Carpenter says her appetite is good she denies dysphagia nausea or vomiting.  Bowel habits are regular, though stool somewhat lighter in color lately.  ROS:  Review of Systems Decreased memory, right arm and leg weakness as noted above, peripheral edema Jaundice  Remainder of systems negative except as above  Past Medical History: Past Medical History:  Diagnosis Date   A-fib ([place])    Arthritis    thumb   Atrial fibrillation ([facility])    4 years ago started   Autoimmune hepatitis ([place])    Brain cancer ([place]) 12/2020   Breast cancer ([place]) 2006   Colon polyps    Glaucoma    Hypercholesteremia    Hypertension    patient denies    Malignant melanoma ([place])    Osteoporosis    Personal history of chemotherapy 2006   Personal history of radiation therapy 2006   Seizure [facility])    Stroke (cerebrum) ([place]) 2015     Past Surgical History: Past Surgical History:  Procedure Laterality Date   APPLICATION OF CRANIAL NAVIGATION Left 05/01/2021   Procedure: APPLICATION OF CRANIAL NAVIGATION;  Surgeon: Consuella Lose, MD;  Location: [place];  Service: Neurosurgery;  Laterality: Left;   AUGMENTATION MAMMAPLASTY Bilateral 2006   Patient had them removed in 2008   BREAST BIOPSY Left 2016   BREAST LUMPECTOMY Right 2006   COLONOSCOPY     had polyps   CRANIOTOMY Left 05/01/2021   Procedure: STEREOTACTIC FRONTAL CRANIOTOMY TUMOR EXCISION;   Surgeon: Consuella Lose, MD;  Location: [place];  Service: Neurosurgery;  Laterality: Left;   EYE SURGERY Right    cataract   IR IMAGING GUIDED PORT INSERTION  06/06/2021   IR US GUIDE VASC ACCESS LEFT  06/06/2021   MELANOMA EXCISION WITH SENTINEL LYMPH NODE BIOPSY N/A 06/02/2019   Procedure: Left lateral ANAL MELANOMA EXCISION WITH Bilateral Groin exploration and SENTINEL node mapping;  Surgeon: [name], MD;  Location: [place];  Service: General;  Laterality: N/A;   melanoma removal Left    crown   TONSILLECTOMY       Family History: Family History  Problem Relation Age of Onset   Lung cancer Mother    Hypotension Mother    Pancreatic cancer Father    Hypertension Sister    Kidney cancer Brother    Heart disease Maternal Grandmother    Cirrhosis Maternal Grandfather    Breast cancer Paternal Grandmother    Cirrhosis Paternal Grandfather    Breast cancer Paternal Aunt    Depression Son    Hypothyroidism Son    Arnold-Chiari malformation Son     Social History: Social History   Socioeconomic History   Marital status: Widowed    Spouse name: Not on file   Number of children: 1   Years of education: Not on file   Highest education level: Not on file  Occupational History   Occupation: retired  Tobacco Use   Smoking status: Never    Passive exposure: Yes   Smokeless tobacco: Never   Tobacco comments:    smoked in college but didn't inhale  Vaping Use   Vaping Use: Never used  Substance and Sexual Activity   Alcohol use: No    Alcohol/week: 0.0 standard drinks   Drug use: No   Sexual activity: Not Currently  Other Topics Concern   Not on file  Social History Narrative   Not on file   Social Determinants of Health   Financial Resource Strain: Not on file  Food Insecurity: Not on file  Transportation Needs: Not on file  Physical Activity: Not on file  Stress: Not on file  Social Connections: Not on file    Allergies: No Known  Allergies  Outpatient Meds: Current Outpatient Medications  Medication Sig Dispense Refill   acyclovir (ZOVIRAX) 400 MG tablet Take 1 tablet (400 mg total) by mouth 2 (two) times daily. 180 tablet 2   bethanechol (URECHOLINE) 50 MG tablet Take 1 tablet (50 mg total) by mouth 3 (three) times daily. 180 tablet 2   bisacodyl (DULCOLAX) 10 MG suppository Place 1 suppository (10 mg total) rectally daily as needed for moderate constipation. 12 suppository 0   camphor-menthol (SARNA) lotion Apply topically as needed for itching. (Patient taking differently: Apply 1 application topically daily as needed for itching.) 222 mL 0   cetirizine (ZYRTEC) 10 MG tablet Take 10 mg by mouth 2 (two) times daily.     Cholecalciferol (VITAMIN D3) 250 MCG (10000 UT) capsule 1 tablet  hydrOXYzine (ATARAX/VISTARIL) 25 MG tablet Take 25 mg by mouth 3 (three) times daily.     levETIRAcetam (KEPPRA) 500 MG tablet 1 tablet     metoprolol tartrate (LOPRESSOR) 25 MG tablet Take 1 tablet by mouth 2 (two) times daily.     Multiple Vitamins-Minerals (PRESERVISION AREDS 2+MULTI VIT) CAPS Take 1 capsule by mouth 2 (two) times daily.     pantoprazole (PROTONIX) 40 MG tablet Take 1 tablet (40 mg total) by mouth at bedtime. 90 tablet 2   polyethylene glycol (MIRALAX / GLYCOLAX) 17 g packet Take 17 g by mouth 2 (two) times daily. 60 each 0   predniSONE (DELTASONE) 20 MG tablet Take 3 tablets (60 mg total) by mouth daily. 30 tablet 1   senna-docusate (SENOKOT-S) 8.6-50 MG tablet Take 1 tablet by mouth 2 (two) times daily. 60 tablet 0   tamsulosin (FLOMAX) 0.4 MG CAPS capsule Take 1 capsule (0.4 mg total) by mouth daily after supper. 30 capsule 0   timolol (TIMOPTIC) 0.5 % ophthalmic solution Place 1 drop into both eyes 2 (two) times daily. 30 mL 2   No current facility-administered medications for this visit.      ___________________________________________________________________ Objective   Exam:  BP 130/62 (BP  Location: Left Arm, Patient Position: Sitting, Cuff Size: Normal)   Pulse 72 Comment: irregular Wt Readings from Last 3 Encounters:  07/27/21 152 lb 8.9 oz (69.2 kg)  07/12/21 150 lb (68 kg)  07/03/21 150 lb (68 kg)    General: Pleasant, sitting in wheelchair, jaundiced, alert and conversational.  Chronically ill-appearing, weakness in right arm and leg, no dysarthria. Eyes: sclera anicteric, no redness ENT: oral mucosa moist without lesions, no cervical or supraclavicular lymphadenopathy CV: RRR without murmur, S1/S2, no JVD, bilateral pitting edema Resp: clear to auscultation bilaterally, normal RR and effort noted GI: soft, no tenderness, with active bowel sounds.  Exam limited wheelchair, no appreciable hepatomegaly Skin; mildly jaundiced  Labs:  Lab Results  Component Value Date   INR 1.0 08/06/2021   INR 1.0 08/01/2021   INR 1.3 (H) 07/28/2021   CBC Latest Ref Rng & Units 08/06/2021 08/02/2021 07/30/2021  WBC 4.0 - 10.5 K/uL 18.9(H) 19.2(H) 17.9(H)  Hemoglobin 12.0 - 15.0 g/dL 10.2(L) 10.0(L) 10.4(L)  Hematocrit 36.0 - 46.0 % 29.7(L) 30.2(L) 30.7(L)  Platelets 150 - 400 K/uL 249 253 267   CMP Latest Ref Rng & Units 08/06/2021 08/02/2021 08/01/2021  Glucose 70 - 99 mg/dL 98 150(H) -  BUN 8 - 23 mg/dL 27(H) 25(H) -  Creatinine 0.44 - 1.00 mg/dL 0.92 0.99 -  Sodium 135 - 145 mmol/L 137 137 -  Potassium 3.5 - 5.1 mmol/L 3.8 4.1 -  Chloride 98 - 111 mmol/L 102 104 -  CO2 22 - 32 mmol/L 28 24 -  Calcium 8.9 - 10.3 mg/dL 8.8(L) 9.3 -  Total Protein 6.5 - 8.1 g/dL 5.6(L) 5.6(L) 6.1  Total Bilirubin 0.3 - 1.2 mg/dL 4.1(HH) 3.8(HH) 4.2(H)  Alkaline Phos 38 - 126 U/L 635(H) 731(H) 776(H)  AST 15 - 41 U/L 156(H) 237(HH) 279(H)  ALT 0 - 44 U/L 601(HH) 758(HH) 782(H)   Hepatic Function Latest Ref Rng & Units 08/06/2021 08/02/2021 08/01/2021  Total Protein 6.5 - 8.1 g/dL 5.6(L) 5.6(L) 6.1  Albumin 3.5 - 5.0 g/dL 2.9(L) 2.8(L) 3.6  AST 15 - 41 U/L 156(H) 237(HH) 279(H)  ALT 0 -  44 U/L 601(HH) 758(HH) 782(H)  Alk Phosphatase 38 - 126 U/L 635(H) 731(H) 776(H)  Total Bilirubin 0.3 - 1.2  mg/dL 4.1(HH) 3.8(HH) 4.2(H)  Bilirubin, Direct 0.0 - 0.3 mg/dL - - 2.2(H)   Of note, on 06/21/2021 (date of an oncology visit and plans to start her immune checkpoint inhibitor treatment), LFTs as follows:  AST 143, ALT 194, alkaline phosphatase 485, total bilirubin 2.0 Hepatic function panel normal on 05/04/2021  Antimitochondrial antibody negative, ANA negative, anti-LK M1 negative, IgG normal at 634 Actin antibody normal  Assessment: Encounter Diagnoses  Name Primary?   Drug-induced liver injury Yes   Cholestatic jaundice   Severe drug-induced liver injury, now stable on prednisone. I reassured them that the INR is normal, and that it is typical for the bilirubin to fluctuate some in peak and decline later than the other LFTs.  I also was clear that it may take months for this to resolve, and that we will start tapering the prednisone and watch her LFTs closely.  There is no way of knowing for certain which medicine caused this, but suspect it was a delayed effect of the last Keytruda dose rather than from the 2 more recent medicines, since the LFTs were already significantly elevated before she received the latter. Nevertheless, with the severity of this cholestatic hepatitis, I would not recommend resuming those medicines at this point, and perhaps not at all depending on her clinical progress.  Ultimately that decision will be Dr. Hazeline Junker after discussing risks and benefits with the patient.  Plan: Taper prednisone to 50 mg a day starting tomorrow.  They have 20 mg tablets, so her son understands to give her 2-1/2 tablets a day starting tomorrow.  INR has normalized, I do not think it needs to be followed unless the LFTs make a significant rise.  Recheck hepatic function panel 3 days from now.  There may still be some mild fluctuation of the bilirubin, but if things are  stable overall, we will continue with the current plan and then recheck LFTs 5 to 6 days after that.  If things continue to improve, the most likely plan will be decreasing the prednisone by 10 mg a day every week, and I suspect we will make that decision week by week based on lab results  She has follow-up with Dr. Alen Blew in 2 and half to 3 weeks, and I will forward my note to him so we can communicate closely about this patient.   45 minutes were spent on this encounter (including extensive chart review, history/exam, counseling/coordination of care, and documentation) > 50% of that time was spent on counseling and coordination of care.  Nelida Meuse III  CC: Referring provider noted above

## 2021-08-06 NOTE — Patient Instructions (Addendum)
If you are age 78 or older, your body mass index should be between 23-30. Your There is no height or weight on file to calculate BMI. If this is out of the aforementioned range listed, please consider follow up with your Primary Care Provider.  If you are age 18 or younger, your body mass index should be between 19-25. Your There is no height or weight on file to calculate BMI. If this is out of the aformentioned range listed, please consider follow up with your Primary Care Provider.   ________________________________________________________  The [facility] providers would like to encourage you to use [facility] to communicate with providers for non-urgent requests or questions.  Due to long hold times on the telephone, sending your provider a message by [facility] may be a faster and more efficient way to get a response.  Please allow 48 business hours for a response.  Please remember that this is for non-urgent requests.  _______________________________________________________  Your provider has requested that you go to the basement level for lab work before leaving today. Press "B" on the elevator. The lab is located at the first door on the left as you exit the elevator. (Thursday 08-09-2021)  Due to recent changes in healthcare laws, you may see the results of your imaging and laboratory studies on MyChart before your provider has had a chance to review them.  We understand that in some cases there may be results that are confusing or concerning to you. Not all laboratory results come back in the same time frame and the provider may be waiting for multiple results in order to interpret others.  Please give [place] 48 hours in order for your provider to thoroughly review all the results before contacting the office for clarification of your results.    It was a pleasure to see you today!  Thank you for trusting me with your gastrointestinal care!

## 2021-08-06 NOTE — Telephone Encounter (Signed)
CRITICAL VALUE STICKER  CRITICAL VALUE: total bili: 4.1, ALT 601  RECEIVER (on-site recipient of call): Dr Earlie Server  DATE & TIME NOTIFIED: 08/06/21 [place] (representative from lab): RB  MD NOTIFIED: Dr Julien Nordmann  TIME OF NOTIFICATION: 08/06/21 1302  RESPONSE: Notify [facility].  I spoke with pt's son making him aware of critical labs, son denies pt having abd pain, melena or hematochezia. Son states pt sees GI today.

## 2021-08-09 ENCOUNTER — Encounter: Payer: Self-pay | Admitting: Oncology

## 2021-08-09 ENCOUNTER — Other Ambulatory Visit (INDEPENDENT_AMBULATORY_CARE_PROVIDER_SITE_OTHER): Payer: Medicare Other

## 2021-08-09 DIAGNOSIS — R748 Abnormal levels of other serum enzymes: Secondary | ICD-10-CM

## 2021-08-09 DIAGNOSIS — R945 Abnormal results of liver function studies: Secondary | ICD-10-CM | POA: Diagnosis not present

## 2021-08-09 LAB — HEPATIC FUNCTION PANEL
ALT: 397 U/L — ABNORMAL HIGH (ref 0–35)
AST: 89 U/L — ABNORMAL HIGH (ref 0–37)
Albumin: 3.4 g/dL — ABNORMAL LOW (ref 3.5–5.2)
Alkaline Phosphatase: 501 U/L — ABNORMAL HIGH (ref 39–117)
Bilirubin, Direct: 1.9 mg/dL — ABNORMAL HIGH (ref 0.0–0.3)
Total Bilirubin: 3.7 mg/dL — ABNORMAL HIGH (ref 0.2–1.2)
Total Protein: 5.5 g/dL — ABNORMAL LOW (ref 6.0–8.3)

## 2021-08-09 LAB — PROTIME-INR
INR: 1 ratio (ref 0.8–1.0)
Prothrombin Time: 10.9 s (ref 9.6–13.1)

## 2021-08-10 ENCOUNTER — Other Ambulatory Visit: Payer: Self-pay

## 2021-08-10 DIAGNOSIS — K719 Toxic liver disease, unspecified: Secondary | ICD-10-CM

## 2021-08-10 DIAGNOSIS — R748 Abnormal levels of other serum enzymes: Secondary | ICD-10-CM

## 2021-08-10 DIAGNOSIS — R17 Unspecified jaundice: Secondary | ICD-10-CM

## 2021-08-13 ENCOUNTER — Other Ambulatory Visit: Payer: Self-pay

## 2021-08-13 MED ORDER — PREDNISONE 10 MG PO TABS: ORAL_TABLET | ORAL | 0 refills | Status: DC

## 2021-08-16 ENCOUNTER — Telehealth: Payer: Self-pay

## 2021-08-16 ENCOUNTER — Other Ambulatory Visit: Payer: Medicare Other

## 2021-08-16 DIAGNOSIS — R945 Abnormal results of liver function studies: Secondary | ICD-10-CM

## 2021-08-16 DIAGNOSIS — R748 Abnormal levels of other serum enzymes: Secondary | ICD-10-CM

## 2021-08-16 DIAGNOSIS — K719 Toxic liver disease, unspecified: Secondary | ICD-10-CM

## 2021-08-16 DIAGNOSIS — R17 Unspecified jaundice: Secondary | ICD-10-CM

## 2021-08-16 NOTE — Telephone Encounter (Signed)
-----   Message from Yevette Edwards, RN sent at 08/10/2021 12:25 PM EDT ----- Regarding: Labs Repeat LFT's, the order is in epic.

## 2021-08-16 NOTE — Telephone Encounter (Signed)
Spoke with patient's son to remind him that patient is due for repeat labs either today or tomorrow. [name] states that Dr. Alen Blew wanted a CMET and wanted to know if we could order so that patient will not have to have double labs. Advised that we will change the order in epic. He states that patient will come in for labs tomorrow. Scott had no concerns at the end of the call.   Lab order updated.

## 2021-08-17 ENCOUNTER — Other Ambulatory Visit (INDEPENDENT_AMBULATORY_CARE_PROVIDER_SITE_OTHER): Payer: Medicare Other

## 2021-08-17 DIAGNOSIS — R17 Unspecified jaundice: Secondary | ICD-10-CM

## 2021-08-17 DIAGNOSIS — R945 Abnormal results of liver function studies: Secondary | ICD-10-CM

## 2021-08-17 DIAGNOSIS — R748 Abnormal levels of other serum enzymes: Secondary | ICD-10-CM

## 2021-08-17 DIAGNOSIS — K719 Toxic liver disease, unspecified: Secondary | ICD-10-CM

## 2021-08-17 LAB — COMPREHENSIVE METABOLIC PANEL
ALT: 258 U/L — ABNORMAL HIGH (ref 0–35)
AST: 70 U/L — ABNORMAL HIGH (ref 0–37)
Albumin: 3.4 g/dL — ABNORMAL LOW (ref 3.5–5.2)
Alkaline Phosphatase: 435 U/L — ABNORMAL HIGH (ref 39–117)
BUN: 26 mg/dL — ABNORMAL HIGH (ref 6–23)
CO2: 31 mEq/L (ref 19–32)
Calcium: 8.8 mg/dL (ref 8.4–10.5)
Chloride: 100 mEq/L (ref 96–112)
Creatinine, Ser: 0.84 mg/dL (ref 0.40–1.20)
GFR: 66.77 mL/min (ref >60.00)
Glucose, Bld: 102 mg/dL — ABNORMAL HIGH (ref 70–99)
Potassium: 4.1 mEq/L (ref 3.5–5.1)
Sodium: 136 mEq/L (ref 135–145)
Total Bilirubin: 2.7 mg/dL — ABNORMAL HIGH (ref 0.2–1.2)
Total Protein: 5.6 g/dL — ABNORMAL LOW (ref 6.0–8.3)

## 2021-08-20 ENCOUNTER — Inpatient Hospital Stay: Payer: Medicare Other | Attending: Oncology | Admitting: Internal Medicine

## 2021-08-20 ENCOUNTER — Telehealth: Payer: Self-pay | Admitting: *Deleted

## 2021-08-20 DIAGNOSIS — G40109 Localization-related (focal) (partial) symptomatic epilepsy and epileptic syndromes with simple partial seizures, not intractable, without status epilepticus: Secondary | ICD-10-CM

## 2021-08-20 DIAGNOSIS — C211 Malignant neoplasm of anal canal: Secondary | ICD-10-CM | POA: Insufficient documentation

## 2021-08-20 DIAGNOSIS — Z9221 Personal history of antineoplastic chemotherapy: Secondary | ICD-10-CM | POA: Insufficient documentation

## 2021-08-20 DIAGNOSIS — C7931 Secondary malignant neoplasm of brain: Secondary | ICD-10-CM | POA: Insufficient documentation

## 2021-08-20 DIAGNOSIS — R531 Weakness: Secondary | ICD-10-CM | POA: Insufficient documentation

## 2021-08-20 DIAGNOSIS — K754 Autoimmune hepatitis: Secondary | ICD-10-CM | POA: Insufficient documentation

## 2021-08-20 DIAGNOSIS — Z7952 Long term (current) use of systemic steroids: Secondary | ICD-10-CM | POA: Insufficient documentation

## 2021-08-20 DIAGNOSIS — R569 Unspecified convulsions: Secondary | ICD-10-CM | POA: Insufficient documentation

## 2021-08-20 DIAGNOSIS — Z79899 Other long term (current) drug therapy: Secondary | ICD-10-CM | POA: Insufficient documentation

## 2021-08-20 MED ORDER — LEVETIRACETAM 750 MG PO TABS: 750.0000 mg | ORAL_TABLET | Freq: Two times a day (BID) | ORAL | 3 refills | Status: DC

## 2021-08-20 NOTE — Progress Notes (Signed)
I connected with Angela Carpenter on 08/20/21 at  1:00 PM EST by telephone visit and verified that I am speaking with the correct person using two identifiers.  I discussed the limitations, risks, security and privacy concerns of performing an evaluation and management service by telemedicine and the availability of in-person appointments. I also discussed with the patient that there may be a patient responsible charge related to this service. The patient expressed understanding and agreed to proceed.  Other persons participating in the visit and their role in the encounter:  son  Patient's location:  Home  Provider's location:  Office  Chief Complaint:  Metastasis to brain [facility])  Seizure disorder, focal motor ([place])  History of Present Ilness: Angela Carpenter describes seizure event over the weekend, characterized as "2 minutes of right sided shaking and staring".  There was in illness or inciting event, no recent neurologic change.  She returned to baseline after several hours.  She does have acute liver injury from immunotherapy, currently on prednisone taper.  Observations: Language and cognition at baseline  Assessment and Plan: Metastasis to brain [facility])  Seizure disorder, focal motor (HCC)  Breakthrough seizure, possibly provoked by liver injury, CNS effects.  Recommended increasing Keppra to 750mg  BID.  No additional steroids at this time.  Follow Up Instructions: RTC next month after MRI  I discussed the assessment and treatment plan with the patient.  The patient was provided an opportunity to ask questions and all were answered.  The patient agreed with the plan and demonstrated understanding of the instructions.    The patient was advised to call back or seek an in-person evaluation if the symptoms worsen or if the condition fails to improve as anticipated.  I provided 5-10 minutes of non-face-to-face time during this enocunter.  [name], MD   I provided 22 minutes of non  face-to-face telephone visit time during this encounter, and > 50% was spent counseling as documented under my assessment & plan.

## 2021-08-20 NOTE — Telephone Encounter (Signed)
Patients son called to report seizure that occurred this weekend.  Patient had full body jerking that lasted about a minute and half.  Son called EMS and they evaluated patient but did not transfer her.  He states she is on Keppra 750 mg BID and wasn't sure if that needed to be adjusted or if MRI needed to be done sooner because of new seizure episode.  He also had questions about her steroid and the hepatitis that developed.  Per Dr Mickeal Skinner scheduled phone visit for him  to talk about above issues with son today.

## 2021-08-21 ENCOUNTER — Telehealth: Payer: Self-pay | Admitting: *Deleted

## 2021-08-21 ENCOUNTER — Other Ambulatory Visit: Payer: Self-pay | Admitting: Internal Medicine

## 2021-08-21 MED ORDER — LEVETIRACETAM 1000 MG PO TABS: 1000.0000 mg | ORAL_TABLET | Freq: Two times a day (BID) | ORAL | 3 refills | Status: AC

## 2021-08-21 NOTE — Telephone Encounter (Signed)
Patients son called about increase dose of Keppra after talking to Dr Mickeal Skinner yesterday.  He states that when he picked up the medication he noticed that the medication she was on prior to the change was 750 mg bid and now the new dose is the same.  He was under the impression that she would have a very small dose increase.    Confirmed on past prescription history that Keppra 750 mg BID was prescribed previously by Dr Alger Simons.  Routing to MD to advise

## 2021-08-22 ENCOUNTER — Telehealth: Payer: Self-pay | Admitting: Radiation Therapy

## 2021-08-22 NOTE — Telephone Encounter (Signed)
I received a call from pt's son, Savayah Waltrip, requesting assistance with his mother's changing personality. Ms. Hayes is depressed, crying very often and easily agitated. Her son says she will become aggressive and yell at him. He feels this is worsened by being on steroids and is asking if there are other medical interventions to help with her current mood, depression, and behavior?  [name] is her primary care giver and said he cannot continue with things like they are and knows she is suffering as well.   He asked that I send this message to her care team and include social work for an extra layer of support. Ms. Clift is scheduled to see Dr. Alen Blew Thursday 11/17. Her son is very interested in also seeing someone from social work if possible.   Thank you,  Mont Dutton R.T.(R)(T) Radiation Special Procedures Navigator

## 2021-08-23 ENCOUNTER — Inpatient Hospital Stay: Payer: Medicare Other

## 2021-08-23 ENCOUNTER — Other Ambulatory Visit: Payer: Self-pay

## 2021-08-23 ENCOUNTER — Telehealth: Payer: Self-pay

## 2021-08-23 ENCOUNTER — Inpatient Hospital Stay (HOSPITAL_BASED_OUTPATIENT_CLINIC_OR_DEPARTMENT_OTHER): Payer: Medicare Other | Admitting: Oncology

## 2021-08-23 VITALS — BP 120/64 | HR 49 | Temp 97.9°F | Resp 17 | Ht 65.0 in

## 2021-08-23 DIAGNOSIS — C211 Malignant neoplasm of anal canal: Secondary | ICD-10-CM

## 2021-08-23 DIAGNOSIS — R569 Unspecified convulsions: Secondary | ICD-10-CM | POA: Diagnosis not present

## 2021-08-23 DIAGNOSIS — C7931 Secondary malignant neoplasm of brain: Secondary | ICD-10-CM | POA: Diagnosis not present

## 2021-08-23 DIAGNOSIS — Z9221 Personal history of antineoplastic chemotherapy: Secondary | ICD-10-CM | POA: Diagnosis not present

## 2021-08-23 DIAGNOSIS — Z79899 Other long term (current) drug therapy: Secondary | ICD-10-CM | POA: Diagnosis not present

## 2021-08-23 DIAGNOSIS — R17 Unspecified jaundice: Secondary | ICD-10-CM

## 2021-08-23 DIAGNOSIS — Z95828 Presence of other vascular implants and grafts: Secondary | ICD-10-CM

## 2021-08-23 DIAGNOSIS — K719 Toxic liver disease, unspecified: Secondary | ICD-10-CM

## 2021-08-23 DIAGNOSIS — R748 Abnormal levels of other serum enzymes: Secondary | ICD-10-CM

## 2021-08-23 DIAGNOSIS — Z7952 Long term (current) use of systemic steroids: Secondary | ICD-10-CM | POA: Diagnosis not present

## 2021-08-23 DIAGNOSIS — K754 Autoimmune hepatitis: Secondary | ICD-10-CM | POA: Diagnosis not present

## 2021-08-23 DIAGNOSIS — R531 Weakness: Secondary | ICD-10-CM | POA: Diagnosis not present

## 2021-08-23 DIAGNOSIS — E039 Hypothyroidism, unspecified: Secondary | ICD-10-CM

## 2021-08-23 DIAGNOSIS — R945 Abnormal results of liver function studies: Secondary | ICD-10-CM

## 2021-08-23 LAB — CBC WITH DIFFERENTIAL (CANCER CENTER ONLY)
Abs Immature Granulocytes: 0.15 10*3/uL — ABNORMAL HIGH (ref 0.00–0.07)
Basophils Absolute: 0 10*3/uL (ref 0.0–0.1)
Basophils Relative: 0 %
Eosinophils Absolute: 0.1 10*3/uL (ref 0.0–0.5)
Eosinophils Relative: 1 %
HCT: 33.8 % — ABNORMAL LOW (ref 36.0–46.0)
Hemoglobin: 11.3 g/dL — ABNORMAL LOW (ref 12.0–15.0)
Immature Granulocytes: 2 %
Lymphocytes Relative: 13 %
Lymphs Abs: 1.3 10*3/uL (ref 0.7–4.0)
MCH: 31.5 pg (ref 26.0–34.0)
MCHC: 33.4 g/dL (ref 30.0–36.0)
MCV: 94.2 fL (ref 80.0–100.0)
Monocytes Absolute: 0.8 10*3/uL (ref 0.1–1.0)
Monocytes Relative: 8 %
Neutro Abs: 7.4 10*3/uL (ref 1.7–7.7)
Neutrophils Relative %: 76 %
Platelet Count: 188 10*3/uL (ref 150–400)
RBC: 3.59 MIL/uL — ABNORMAL LOW (ref 3.87–5.11)
RDW: 17.4 % — ABNORMAL HIGH (ref 11.5–15.5)
WBC Count: 9.8 10*3/uL (ref 4.0–10.5)
nRBC: 0 % (ref 0.0–0.2)

## 2021-08-23 LAB — CMP (CANCER CENTER ONLY)
ALT: 211 U/L — ABNORMAL HIGH (ref 0–44)
AST: 62 U/L — ABNORMAL HIGH (ref 15–41)
Albumin: 2.8 g/dL — ABNORMAL LOW (ref 3.5–5.0)
Alkaline Phosphatase: 442 U/L — ABNORMAL HIGH (ref 38–126)
Anion gap: 7 (ref 5–15)
BUN: 24 mg/dL — ABNORMAL HIGH (ref 8–23)
CO2: 28 mmol/L (ref 22–32)
Calcium: 8.9 mg/dL (ref 8.9–10.3)
Chloride: 102 mmol/L (ref 98–111)
Creatinine: 0.81 mg/dL (ref 0.44–1.00)
GFR, Estimated: >60 mL/min (ref >60)
Glucose, Bld: 95 mg/dL (ref 70–99)
Potassium: 3.8 mmol/L (ref 3.5–5.1)
Sodium: 137 mmol/L (ref 135–145)
Total Bilirubin: 2.3 mg/dL — ABNORMAL HIGH (ref 0.3–1.2)
Total Protein: 5.4 g/dL — ABNORMAL LOW (ref 6.5–8.1)

## 2021-08-23 LAB — TSH: TSH: 3.038 u[IU]/mL (ref 0.308–3.960)

## 2021-08-23 MED ORDER — SODIUM CHLORIDE 0.9% FLUSH
10.0000 mL | Freq: Once | INTRAVENOUS | Status: AC
  Administered 2021-08-23: 12:00:00 10 mL

## 2021-08-23 MED ORDER — HEPARIN SOD (PORK) LOCK FLUSH 100 UNIT/ML IV SOLN
500.0000 [IU] | Freq: Once | INTRAVENOUS | Status: AC | PRN
  Administered 2021-08-23: 12:00:00 500 [IU]

## 2021-08-23 MED ORDER — SODIUM CHLORIDE 0.9% FLUSH
10.0000 mL | INTRAVENOUS | Status: DC | PRN
  Administered 2021-08-23: 12:00:00 10 mL

## 2021-08-23 NOTE — Progress Notes (Signed)
Hematology and Oncology Follow Up Visit  Angela Carpenter [id number] 1941/04/28 80 y.o. 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  She developed stage IV with pelvic involvement as well as CNS disease.    Prior Therapy:   She is status post perianal excision in August 2020.  Stereotactic radiosurgery completed on December 28, 2020.  He received 18 Gray in 1 fraction.  Pembrolizumab 200 mg every 3 weeks started on July 29, 2019.  Last treatment given on November 08, 2020.  She is status post left frontal craniotomy and resection of her residual melanoma completed on May 01, 2021.  Pembrolizumab 400 mg every 6 weeks started on Feb 15, 2021.  Last treatment given on March 29, 2021.  Stereotactic radiosurgery started on September 6.  She completed treatment on 06/22/2021.   Ipilimumab 1 mg/kg with nivolumab 3 mg/kg started on 06/21/2021.  Therapy currently on hold for hepatotoxicity.  Current therapy: Active surveillance.      Interim History: Angela Carpenter presents today for repeat evaluation.  Since her last visit, she reports no major changes in her health.  She has reported a slight decline in exercise tolerance and stamina.  She has reported some weakness in her lower extremity but still participate in physical and occupational therapy.  According to her son she has experienced some irritability and mood changes and is concerned about depression.  He denies any fevers, chills or sweats.  Her appetite remained excellent.     Medications: Reviewed without changes. Current Outpatient Medications  Medication Sig Dispense Refill   acyclovir (ZOVIRAX) 400 MG tablet Take 1 tablet (400 mg total) by mouth 2 (two) times daily. 180 tablet 2   bethanechol (URECHOLINE) 50 MG tablet Take 1 tablet (50 mg total) by mouth 3 (three) times daily. 180 tablet 2   bisacodyl  (DULCOLAX) 10 MG suppository Place 1 suppository (10 mg total) rectally daily as needed for moderate constipation. 12 suppository 0   camphor-menthol (SARNA) lotion Apply topically as needed for itching. (Patient taking differently: Apply 1 application topically daily as needed for itching.) 222 mL 0   cetirizine (ZYRTEC) 10 MG tablet Take 10 mg by mouth 2 (two) times daily.     Cholecalciferol (VITAMIN D3) 250 MCG (10000 UT) capsule 1 tablet     hydrOXYzine (ATARAX/VISTARIL) 25 MG tablet Take 25 mg by mouth 3 (three) times daily.     levETIRAcetam (KEPPRA) 1000 MG tablet Take 1 tablet (1,000 mg total) by mouth 2 (two) times daily. 60 tablet 3   metoprolol tartrate (LOPRESSOR) 25 MG tablet Take 1 tablet by mouth 2 (two) times daily.     Multiple Vitamins-Minerals (PRESERVISION AREDS 2+MULTI VIT) CAPS Take 1 capsule by mouth 2 (two) times daily.     pantoprazole (PROTONIX) 40 MG tablet Take 1 tablet (40 mg total) by mouth at bedtime. 90 tablet 2   polyethylene glycol (MIRALAX / GLYCOLAX) 17 g packet Take 17 g by mouth 2 (two) times daily. 60 each 0   predniSONE (DELTASONE) 10 MG tablet Take 4 tablets (40 mg total) by mouth daily with breakfast for 7 days, THEN 3 tablets (30 mg total) daily with breakfast for 7 days, THEN 2 tablets (20 mg total) daily with breakfast for 7 days, THEN 1 tablet (10 mg total) daily with breakfast for 7 days, THEN 1 tablet (10 mg total) every other day  for 7 days. 75 tablet 0   senna-docusate (SENOKOT-S) 8.6-50 MG tablet Take 1 tablet by mouth 2 (two) times daily. 60 tablet 0   tamsulosin (FLOMAX) 0.4 MG CAPS capsule Take 1 capsule (0.4 mg total) by mouth daily after supper. 30 capsule 0   timolol (TIMOPTIC) 0.5 % ophthalmic solution Place 1 drop into both eyes 2 (two) times daily. 30 mL 2   No current facility-administered medications for this visit.     Allergies: No Known Allergies     Physical Exam:     Blood pressure 120/64, pulse (!) 49, temperature 97.9  F (36.6 C), temperature source Oral, resp. rate 17, height 5\' 5"  (1.651 m), SpO2 100 %.      ECOG: 1    General appearance: Alert, awake without any distress. Head: Atraumatic without abnormalities Oropharynx: Without any thrush or ulcers. Eyes: No scleral icterus. Lymph nodes: No lymphadenopathy noted in the cervical, supraclavicular, or axillary nodes Heart:regular rate and rhythm, without any murmurs or gallops.   Lung: Clear to auscultation without any rhonchi, wheezes or dullness to percussion. Abdomin: Soft, nontender without any shifting dullness or ascites. Musculoskeletal: No clubbing or cyanosis. Neurological: No new deficits noted. Skin: No rashes or lesions.                                  Lab Results: Lab Results  Component Value Date   WBC 18.9 (H) 08/06/2021   HGB 10.2 (L) 08/06/2021   HCT 29.7 (L) 08/06/2021   MCV 91.7 08/06/2021   PLT 249 08/06/2021     Chemistry      Component Value Date/Time   NA 136 08/17/2021 1030   NA 138 12/05/2016 0750   K 4.1 08/17/2021 1030   CL 100 08/17/2021 1030   CO2 31 08/17/2021 1030   BUN 26 (H) 08/17/2021 1030   BUN 15 12/05/2016 0750   CREATININE 0.84 08/17/2021 1030   CREATININE 0.92 08/06/2021 1130   CREATININE 0.70 09/05/2015 0828      Component Value Date/Time   CALCIUM 8.8 08/17/2021 1030   ALKPHOS 435 (H) 08/17/2021 1030   AST 70 (H) 08/17/2021 1030   AST 156 (H) 08/06/2021 1130   ALT 258 (H) 08/17/2021 1030   ALT 601 (HH) 08/06/2021 1130   BILITOT 2.7 (H) 08/17/2021 1030   BILITOT 4.1 (Diagonal) 08/06/2021 1130     I have looked back in       Impression and Plan:  80 year old with:   1.   Stage IV mucosal melanoma of the anal canal with CNS involvement documented in 2022.  The natural course of this disease and treatment choices were reviewed at this time.  She is still recovering from hepatotoxicity related to immunotherapy and I have recommended withholding  treatment at this time.  This could be considered in the future.  The plan is to update her staging scans in the next 2 to 3 months.   2.  Immune mediated complications: No additional complications noted at this time.  Autoimmune hepatitis continues to improve with her AST, ALT and bilirubin slowly declining.  She is currently on tapering doses of prednisone and is by gastroenterology.   3.  IV access: Port-A-Cath will continue to be flushed periodically.   4.  Antiemetics: Compazine is available to her without any nausea or vomiting.  5.  CNS metastasis: She had continues to have and described as seizure events  with returning to baseline subsequently.  She continues to follow with Dr. Mickeal Skinner currently on Keppra with increase in her dose.  6.  Personality changes.  Likely multifactorial in nature related to her condition, steroids, mental health issues among other factors.  We will arrange for appropriate referrals to assists her.  7.   Follow-up: In the next 3 to 4 weeks to follow her progress.  30 minutes were dedicated to this visit.  Time spent on reviewing laboratory data, disease status update and outlining future plan of care.   Zola Button, MD 11/17/202211:37 AM

## 2021-08-23 NOTE — Telephone Encounter (Signed)
Lab results available in epic.

## 2021-08-23 NOTE — Telephone Encounter (Signed)
Called and spoke with [name] in regards to patient's lab results and recommendations. Scott verbalized understanding and had no concerns at the end of the call.   Lab order and reminder in epic.

## 2021-08-23 NOTE — Telephone Encounter (Signed)
Alkaline phosphatase remains about the same, remainder of liver labs slowly improving. This is the expected course for her condition.  Continue prednisone taper as previously outlined.  Recheck hepatic function panel in 2 weeks.

## 2021-08-23 NOTE — Telephone Encounter (Signed)
-----   Message from Yevette Edwards, Sec[place] sent at 08/17/2021  1:12 PM EST ----- Regarding: Labs CMP with Dr. Alen Blew on 11/17

## 2021-08-23 NOTE — Addendum Note (Signed)
Addended by: Yevette Edwards on: 08/23/2021 04:36 PM   Modules accepted: Orders

## 2021-08-24 ENCOUNTER — Other Ambulatory Visit: Payer: Self-pay | Admitting: *Deleted

## 2021-08-24 MED ORDER — TAMSULOSIN HCL 0.4 MG PO CAPS: 0.4000 mg | ORAL_CAPSULE | Freq: Every day | ORAL | 5 refills | Status: AC

## 2021-08-28 ENCOUNTER — Encounter: Payer: Self-pay | Admitting: Internal Medicine

## 2021-08-29 ENCOUNTER — Other Ambulatory Visit: Payer: Self-pay | Admitting: *Deleted

## 2021-08-29 ENCOUNTER — Telehealth: Payer: Self-pay | Admitting: *Deleted

## 2021-08-29 MED ORDER — DEXAMETHASONE 2 MG PO TABS: 2.0000 mg | ORAL_TABLET | Freq: Two times a day (BID) | ORAL | 0 refills | Status: AC

## 2021-08-29 NOTE — Telephone Encounter (Signed)
Prescription for Dexamethasone escribed per Dr. Mickeal Skinner direction: 2mg  tabs: 4mg m BID for 4 days, then 4 mg daily for 8 days then 2mg  daily ongoing. Pt has appt with Dr. Mickeal Skinner on 2021-10-06. Pt's husband called and informed of the above.

## 2021-08-30 DIAGNOSIS — I1310 Hypertensive heart and chronic kidney disease without heart failure, with stage 1 through stage 4 chronic kidney disease, or unspecified chronic kidney disease: Secondary | ICD-10-CM | POA: Diagnosis not present

## 2021-08-30 DIAGNOSIS — D630 Anemia in neoplastic disease: Secondary | ICD-10-CM | POA: Diagnosis not present

## 2021-08-30 DIAGNOSIS — I480 Paroxysmal atrial fibrillation: Secondary | ICD-10-CM | POA: Diagnosis not present

## 2021-08-30 DIAGNOSIS — R7303 Prediabetes: Secondary | ICD-10-CM | POA: Diagnosis not present

## 2021-08-30 DIAGNOSIS — Z86718 Personal history of other venous thrombosis and embolism: Secondary | ICD-10-CM | POA: Diagnosis not present

## 2021-08-30 DIAGNOSIS — D849 Immunodeficiency, unspecified: Secondary | ICD-10-CM | POA: Diagnosis not present

## 2021-08-30 DIAGNOSIS — I69351 Hemiplegia and hemiparesis following cerebral infarction affecting right dominant side: Secondary | ICD-10-CM | POA: Diagnosis not present

## 2021-08-30 DIAGNOSIS — C434 Malignant melanoma of scalp and neck: Secondary | ICD-10-CM | POA: Diagnosis not present

## 2021-08-30 DIAGNOSIS — C7931 Secondary malignant neoplasm of brain: Secondary | ICD-10-CM | POA: Diagnosis not present

## 2021-08-30 DIAGNOSIS — G40909 Epilepsy, unspecified, not intractable, without status epilepticus: Secondary | ICD-10-CM | POA: Diagnosis not present

## 2021-08-30 DIAGNOSIS — H409 Unspecified glaucoma: Secondary | ICD-10-CM | POA: Diagnosis not present

## 2021-08-30 DIAGNOSIS — Z9181 History of falling: Secondary | ICD-10-CM | POA: Diagnosis not present

## 2021-08-30 DIAGNOSIS — K5901 Slow transit constipation: Secondary | ICD-10-CM | POA: Diagnosis not present

## 2021-08-30 DIAGNOSIS — E7800 Pure hypercholesterolemia, unspecified: Secondary | ICD-10-CM | POA: Diagnosis not present

## 2021-08-30 DIAGNOSIS — Z7952 Long term (current) use of systemic steroids: Secondary | ICD-10-CM | POA: Diagnosis not present

## 2021-08-30 DIAGNOSIS — Z79620 Long term (current) use of immunosuppressive biologic: Secondary | ICD-10-CM | POA: Diagnosis not present

## 2021-08-30 DIAGNOSIS — D0351 Melanoma in situ of anal skin: Secondary | ICD-10-CM | POA: Diagnosis not present

## 2021-08-30 DIAGNOSIS — N183 Chronic kidney disease, stage 3 unspecified: Secondary | ICD-10-CM | POA: Diagnosis not present

## 2021-08-30 DIAGNOSIS — R918 Other nonspecific abnormal finding of lung field: Secondary | ICD-10-CM | POA: Diagnosis not present

## 2021-08-30 DIAGNOSIS — D631 Anemia in chronic kidney disease: Secondary | ICD-10-CM | POA: Diagnosis not present

## 2021-08-30 DIAGNOSIS — R339 Retention of urine, unspecified: Secondary | ICD-10-CM | POA: Diagnosis not present

## 2021-08-30 DIAGNOSIS — K759 Inflammatory liver disease, unspecified: Secondary | ICD-10-CM | POA: Diagnosis not present

## 2021-08-30 DIAGNOSIS — Z853 Personal history of malignant neoplasm of breast: Secondary | ICD-10-CM | POA: Diagnosis not present

## 2021-08-30 DIAGNOSIS — M810 Age-related osteoporosis without current pathological fracture: Secondary | ICD-10-CM | POA: Diagnosis not present

## 2021-08-30 DIAGNOSIS — J90 Pleural effusion, not elsewhere classified: Secondary | ICD-10-CM | POA: Diagnosis not present

## 2021-08-31 DIAGNOSIS — C7931 Secondary malignant neoplasm of brain: Secondary | ICD-10-CM | POA: Diagnosis not present

## 2021-08-31 DIAGNOSIS — I69351 Hemiplegia and hemiparesis following cerebral infarction affecting right dominant side: Secondary | ICD-10-CM | POA: Diagnosis not present

## 2021-08-31 DIAGNOSIS — K759 Inflammatory liver disease, unspecified: Secondary | ICD-10-CM | POA: Diagnosis not present

## 2021-08-31 DIAGNOSIS — D630 Anemia in neoplastic disease: Secondary | ICD-10-CM | POA: Diagnosis not present

## 2021-08-31 DIAGNOSIS — C434 Malignant melanoma of scalp and neck: Secondary | ICD-10-CM | POA: Diagnosis not present

## 2021-08-31 DIAGNOSIS — I1310 Hypertensive heart and chronic kidney disease without heart failure, with stage 1 through stage 4 chronic kidney disease, or unspecified chronic kidney disease: Secondary | ICD-10-CM | POA: Diagnosis not present

## 2021-09-03 ENCOUNTER — Telehealth: Payer: Self-pay | Admitting: *Deleted

## 2021-09-03 ENCOUNTER — Other Ambulatory Visit: Payer: Self-pay | Admitting: *Deleted

## 2021-09-03 ENCOUNTER — Ambulatory Visit (HOSPITAL_COMMUNITY)
Admission: RE | Admit: 2021-09-03 | Discharge: 2021-09-03 | Disposition: A | Discharge status: Home / Self Care | Payer: Medicare Other | Source: Ambulatory Visit | Attending: Radiation Oncology | Admitting: Radiation Oncology

## 2021-09-03 ENCOUNTER — Other Ambulatory Visit: Payer: Self-pay | Admitting: Radiation Therapy

## 2021-09-03 DIAGNOSIS — C7931 Secondary malignant neoplasm of brain: Secondary | ICD-10-CM | POA: Diagnosis not present

## 2021-09-03 DIAGNOSIS — G936 Cerebral edema: Secondary | ICD-10-CM | POA: Diagnosis not present

## 2021-09-03 DIAGNOSIS — G9389 Other specified disorders of brain: Secondary | ICD-10-CM | POA: Diagnosis not present

## 2021-09-03 DIAGNOSIS — C719 Malignant neoplasm of brain, unspecified: Secondary | ICD-10-CM | POA: Diagnosis not present

## 2021-09-03 IMAGING — MR MR HEAD WO/W CM
12 series · 48 of 48 positions shown · IV contrast (gadavist)
Comparison: [DATE]

CLINICAL DATA: Brain/CNS neoplasm, surveillance Increased symptoms
even after starting steroids. [REDACTED] Protocol. Metastasis to brain

EXAM:
MRI HEAD WITHOUT AND WITH CONTRAST
TECHNIQUE: Multiplanar, multiecho pulse sequences of the brain and surrounding
structures were obtained without and with intravenous contrast.
CONTRAST:  7.5mL GADAVIST GADOBUTROL 1 MMOL/ML IV SOLN

[Series 5: T1 · sagittal · 3.0mm · 0.75mm/px · 3 of 38 slices shown (1 of 2)]
[im 1/38]
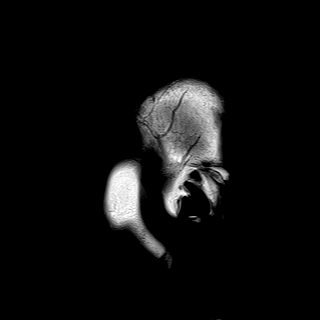
[im 19/38]
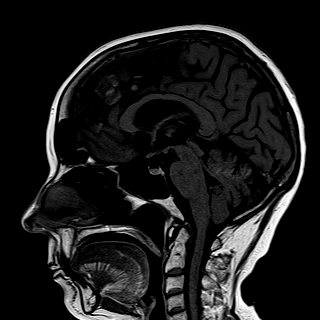
[im 38/38]
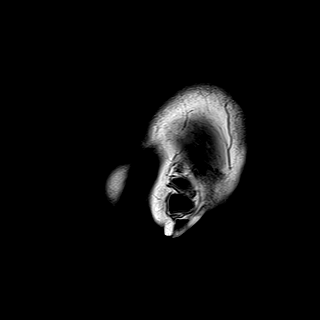

[Series 6: DWI · axial · 3.0mm · 2.09mm/px · z∈[-56,+91]mm · 6 of 100 slices shown (1 of 2)]
[im 1/100]
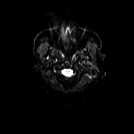
[im 20/100]
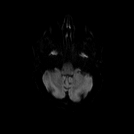
[im 40/100]
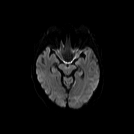
[im 60/100]
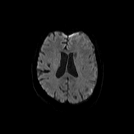
[im 80/100]
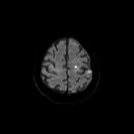
[im 100/100]
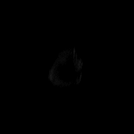

[Series 7: DWI · axial · 3.0mm · 2.09mm/px · z∈[-56,+91]mm · 3 of 50 slices shown (2 of 2)]
[im 1/50]
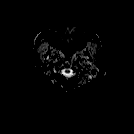
[im 25/50]
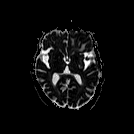
[im 50/50]
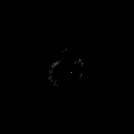

[Series 8: swi_images · axial · 3.0mm · 0.86mm/px · z∈[-65,+100]mm · 3 of 56 slices shown]
[im 1/56]
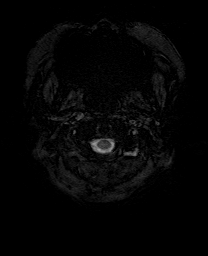
[im 28/56]
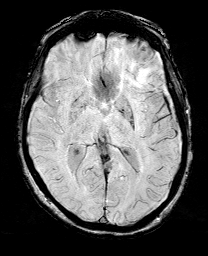
[im 56/56]
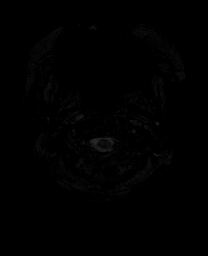

[Series 9: mip_images(sw) · axial · 24.0mm · 0.86mm/px · z∈[-54,+90]mm · 3 of 49 slices shown]
[im 1/49]
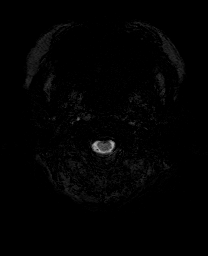
[im 25/49]
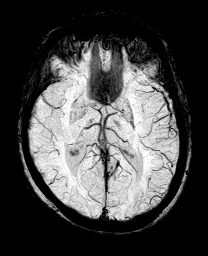
[im 49/49]
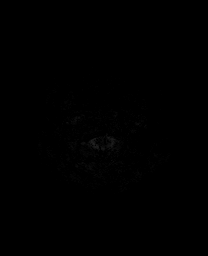

[Series 10: T2 · axial · 5.0mm · 0.57mm/px · z∈[-66,+96]mm · 2 of 28 slices shown]
[im 1/28]
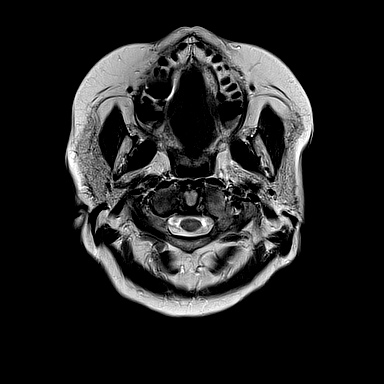
[im 28/28]
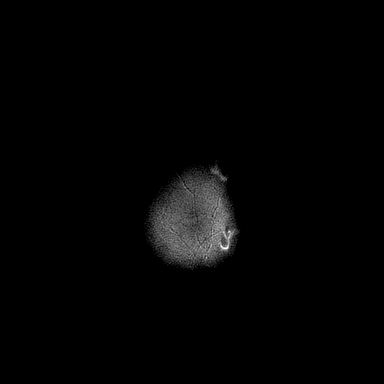

[Series 11: FLAIR · axial · 5.0mm · 0.57mm/px · z∈[-66,+96]mm · 2 of 28 slices shown]
[im 1/28]
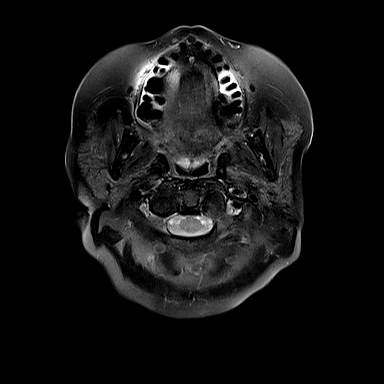
[im 28/28]
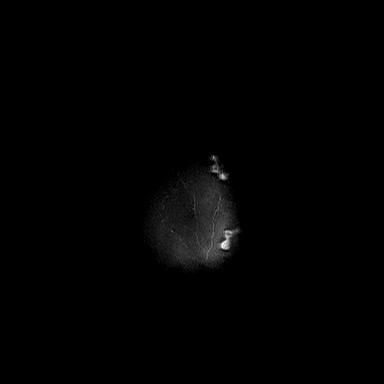

[Series 12: T1 · axial · 1.0mm · 0.94mm/px · z∈[-72,+103]mm · 10 of 176 slices shown (2 of 2)]
[im 1/176]
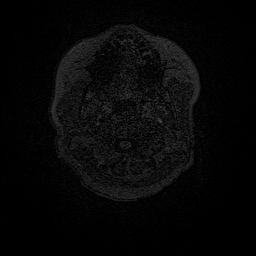
[im 20/176]
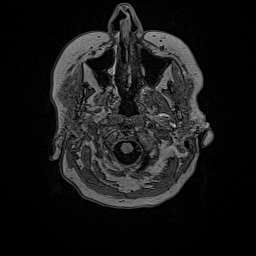
[im 39/176]
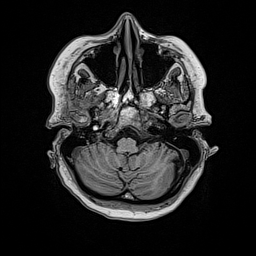
[im 59/176]
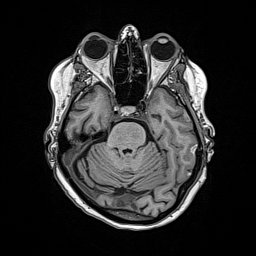
[im 78/176]
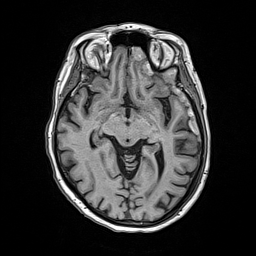
[im 98/176]
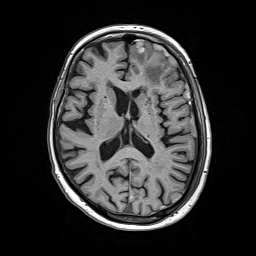
[im 117/176]
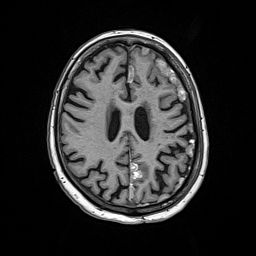
[im 137/176]
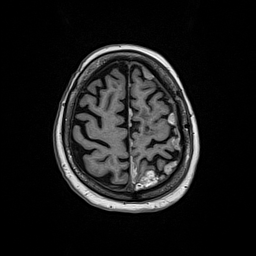
[im 156/176]
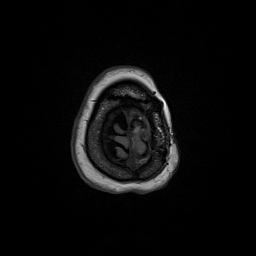
[im 176/176]
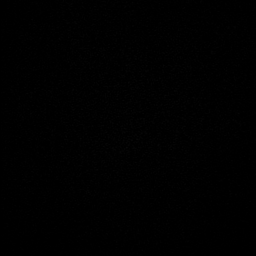

[Series 13: T2 post-contrast · coronal · 3.0mm · 0.69mm/px · 2 of 42 slices shown]
[im 1/42]
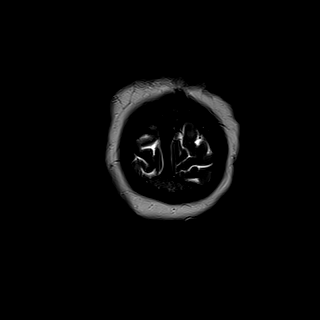
[im 42/42]
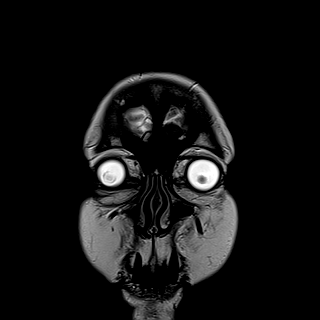

[Series 14: T1 post-contrast · axial · 1.0mm · 0.94mm/px · z∈[-72,+103]mm · 10 of 176 slices shown (1 of 3)]
[im 1/176]
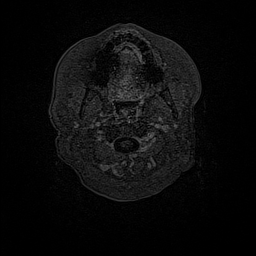
[im 20/176]
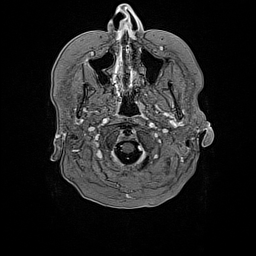
[im 39/176]
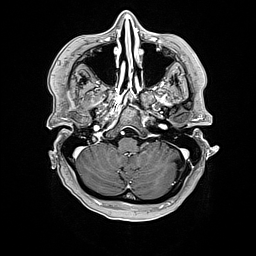
[im 59/176]
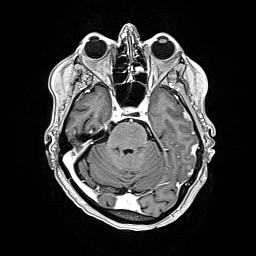
[im 78/176]
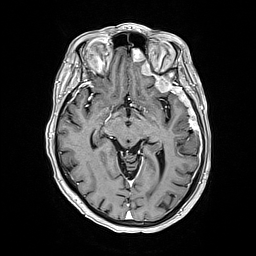
[im 98/176]
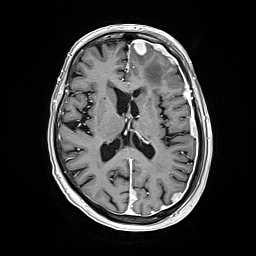
[im 117/176]
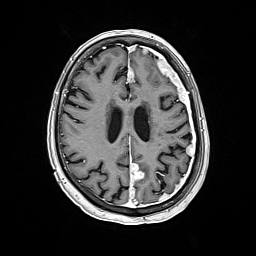
[im 137/176]
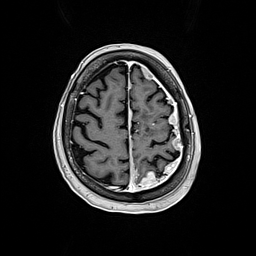
[im 156/176]
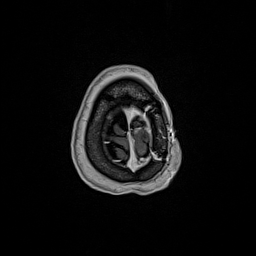
[im 176/176]
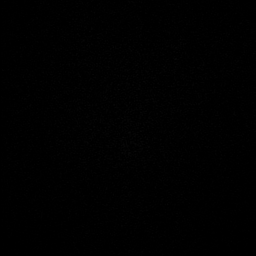

[Series 15: T1 post-contrast · coronal · 3.0mm · 0.57mm/px · 2 of 42 slices shown (2 of 3)]
[im 1/42]
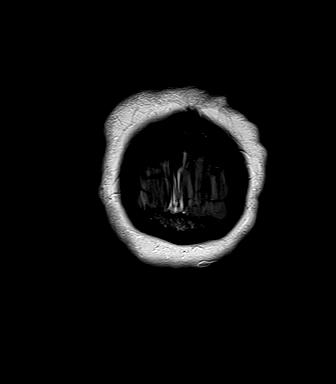
[im 42/42]
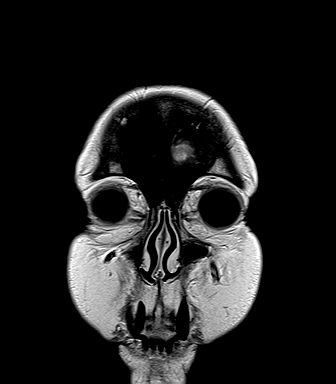

[Series 16: T1 post-contrast · sagittal · 3.0mm · 0.75mm/px · 2 of 40 slices shown (3 of 3)]
[im 1/40]
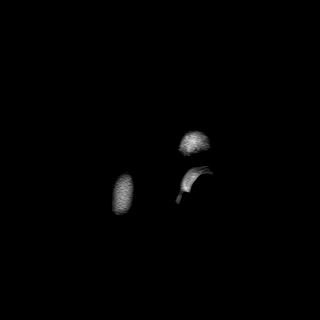
[im 40/40]
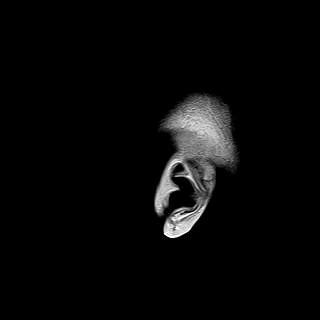

[48 of 48 positions shown; findings below may reference images not displayed]

FINDINGS: Brain: Contraction of left frontal resection cavity with mild
peripheral contrast enhancement. There is a small area of nodularity
along the posterior resection margin (series 14, image 142) that
measures 3 mm. There is new, diffuse nodular dural thickening along
the left falx cerebri and left hemispheric dura. There is no new
intraparenchymal lesion. Mild edema of the posteromedial right
parietal lobe. Mild generalized volume loss is unchanged.

Vascular: Major flow voids are preserved.

Skull and upper cervical spine: Left parietal craniotomy.

Sinuses/Orbits:No paranasal sinus fluid levels or advanced mucosal
thickening. No mastoid or middle ear effusion. Normal orbits.
IMPRESSION: 1. Contraction of left frontal resection cavity with mild peripheral
contrast enhancement, including a new area of nodularity along the
posterior margin that could indicate recurrent/residual tumor.
2. New, diffuse nodular thickening along the left falx cerebri and
left hemispheric dura, concerning for dural metastatic disease.

## 2021-09-03 MED ORDER — GADOBUTROL 1 MMOL/ML IV SOLN
6.0000 mL | Freq: Once | INTRAVENOUS | Status: AC | PRN
  Administered 2021-09-03: 19:00:00 7.5 mL via INTRAVENOUS

## 2021-09-03 NOTE — Telephone Encounter (Signed)
Angela Carpenter called to report that patient has been taking the Decadron 4mg  twice daily and he hasn't seen hardly any improvement with his mom.  He reports that they are supposed to dose reduce to 4 mg daily tomorrow and is unsure if that is best and if increasing is warranted or if this is the projection for her disease.  He sates she can barely walk and has considerable foot drop.  Marked cognitive and speech decline.  Angela can be reach at [phone].  Routed to provider to advise for recommendations.

## 2021-09-03 NOTE — Progress Notes (Signed)
MROI

## 2021-09-04 ENCOUNTER — Inpatient Hospital Stay (HOSPITAL_BASED_OUTPATIENT_CLINIC_OR_DEPARTMENT_OTHER): Payer: Medicare Other | Admitting: Internal Medicine

## 2021-09-04 DIAGNOSIS — G40109 Localization-related (focal) (partial) symptomatic epilepsy and epileptic syndromes with simple partial seizures, not intractable, without status epilepticus: Secondary | ICD-10-CM

## 2021-09-04 DIAGNOSIS — I1310 Hypertensive heart and chronic kidney disease without heart failure, with stage 1 through stage 4 chronic kidney disease, or unspecified chronic kidney disease: Secondary | ICD-10-CM | POA: Diagnosis not present

## 2021-09-04 DIAGNOSIS — K759 Inflammatory liver disease, unspecified: Secondary | ICD-10-CM | POA: Diagnosis not present

## 2021-09-04 DIAGNOSIS — I69351 Hemiplegia and hemiparesis following cerebral infarction affecting right dominant side: Secondary | ICD-10-CM | POA: Diagnosis not present

## 2021-09-04 DIAGNOSIS — C434 Malignant melanoma of scalp and neck: Secondary | ICD-10-CM | POA: Diagnosis not present

## 2021-09-04 DIAGNOSIS — C7931 Secondary malignant neoplasm of brain: Secondary | ICD-10-CM | POA: Diagnosis not present

## 2021-09-04 DIAGNOSIS — D630 Anemia in neoplastic disease: Secondary | ICD-10-CM | POA: Diagnosis not present

## 2021-09-04 NOTE — Progress Notes (Signed)
I connected with Angela Carpenter on 09/04/21 at 10:00 AM EST by telephone visit and verified that I am speaking with the correct person using two identifiers.  I discussed the limitations, risks, security and privacy concerns of performing an evaluation and management service by telemedicine and the availability of in-person appointments. I also discussed with the patient that there may be a patient responsible charge related to this service. The patient expressed understanding and agreed to proceed.  Other persons participating in the visit and their role in the encounter:  son  Patient's location:  Home  Provider's location:  Office  Chief Complaint:  Metastasis to brain [facility])  Seizure disorder, focal motor ([place])  History of Present Ilness: Angela Carpenter and son describe ongoing decline with regards to right sided weakness, cognition, memory and fatigue.  The decadron has not been explicitly helpful as hoped/expected.  Requiring assistance with activities of daily living at home, increased from prior.  Still hasn't had any immunotherapy in past month with the liver toxicity, pending additional labs.  Observations: Language and cognition demonstrate increased psychomotor slowing  Imaging:  [place] Clinician Interpretation: I have personally reviewed the CNS images as listed.  My interpretation, in the context of the patient's clinical presentation, is progressive disease  MR Brain W Wo Contrast  Result Date: 09/03/2021 CLINICAL DATA:  Brain/CNS neoplasm, surveillance Increased symptoms even after starting steroids. 3T SRS Protocol. Metastasis to brain EXAM: MRI HEAD WITHOUT AND WITH CONTRAST TECHNIQUE: Multiplanar, multiecho pulse sequences of the brain and surrounding structures were obtained without and with intravenous contrast. CONTRAST:  7.47mL GADAVIST GADOBUTROL 1 MMOL/ML IV SOLN COMPARISON:  06/05/2021 FINDINGS: Brain: Contraction of left frontal resection cavity with mild peripheral contrast  enhancement. There is a small area of nodularity along the posterior resection margin (series 14, image 142) that measures 3 mm. There is new, diffuse nodular dural thickening along the left falx cerebri and left hemispheric dura. There is no new intraparenchymal lesion. Mild edema of the posteromedial right parietal lobe. Mild generalized volume loss is unchanged. Vascular: Major flow voids are preserved. Skull and upper cervical spine: Left parietal craniotomy. Sinuses/Orbits:No paranasal sinus fluid levels or advanced mucosal thickening. No mastoid or middle ear effusion. Normal orbits. IMPRESSION: 1. Contraction of left frontal resection cavity with mild peripheral contrast enhancement, including a new area of nodularity along the posterior margin that could indicate recurrent/residual tumor. 2. New, diffuse nodular thickening along the left falx cerebri and left hemispheric dura, concerning for dural metastatic disease. Electronically Signed   By: Ulyses Jarred M.D.   On: 09/03/2021 20:24    Assessment and Plan: Metastasis to brain [facility])  Seizure disorder, focal motor ([place])  Angela Carpenter is clinically progressive today despite aggressive corticosteroid regimen.  Brain MRI demonstrates substantial pattern of progression involving dura surrounding left hemisphere and involving the falx.  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    The pattern of progression is aggressive, and impacting the patient clinically.  Options for treatment include resumption of systemic therapy and whole brain irradiation.  WBRT is less likely to be effective with this particular pattern and melanoma histology.    We will reach out to medical oncology and radiation oncology for further input.  We also reviewed goals of care today.  We will continue to be in contact with the patient and her son as these treatment options are sorted out and processed on their end.  Follow Up Instructions: RTC shortly  I discussed the assessment and treatment plan with the patient.  The patient was provided an opportunity to ask questions and all were answered.  The patient agreed with the plan and demonstrated understanding of the instructions.    The patient was advised to call back or seek an in-person evaluation if the symptoms worsen or if the condition fails to improve as anticipated.  I provided 5-10 minutes of non-face-to-face time during this enocunter.  [name], MD   I provided 25 minutes of non face-to-face telephone visit time during this encounter, and > 50% was spent counseling as documented under my assessment & plan.

## 2021-09-06 ENCOUNTER — Other Ambulatory Visit: Payer: Self-pay

## 2021-09-06 ENCOUNTER — Inpatient Hospital Stay: Payer: Medicare Other | Attending: Oncology | Admitting: Oncology

## 2021-09-06 ENCOUNTER — Telehealth: Payer: Self-pay

## 2021-09-06 ENCOUNTER — Telehealth: Payer: Self-pay | Admitting: *Deleted

## 2021-09-06 VITALS — BP 139/70 | HR 99 | Temp 98.1°F | Resp 16 | Ht 65.0 in

## 2021-09-06 DIAGNOSIS — C7931 Secondary malignant neoplasm of brain: Secondary | ICD-10-CM | POA: Diagnosis not present

## 2021-09-06 DIAGNOSIS — C211 Malignant neoplasm of anal canal: Secondary | ICD-10-CM | POA: Diagnosis not present

## 2021-09-06 DIAGNOSIS — C439 Malignant melanoma of skin, unspecified: Secondary | ICD-10-CM | POA: Diagnosis not present

## 2021-09-06 NOTE — Telephone Encounter (Signed)
-----   Message from Yevette Edwards, RN sent at 08/23/2021  4:34 PM EST ----- Regarding: Labs Hepatic function panel, the order is in epic

## 2021-09-06 NOTE — Telephone Encounter (Signed)
Called and spoke with [name] to remind him about patient's repeat labs. [name] states that there have been some new developments in the patient's case. She had an MRI on Monday which showed brain metastasis. [name] states that it is in the dura part of the patient's brain and they are trying to figure out the best treatment plan for her. They plan to see Dr. Alen Blew again today to further discuss options. They will follow with Dr. Alen Blew on the lab work. The results will be available in my chart as well as her office notes.

## 2021-09-06 NOTE — Progress Notes (Signed)
Hematology and Oncology Follow Up Visit  Angela Carpenter [id number] [birth year] 80 y.o. 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  Prior Therapy:   She is status post perianal excision in August 2020.  Stereotactic radiosurgery completed on December 28, 2020.  He received 18 Gray in 1 fraction.  Pembrolizumab 200 mg every 3 weeks started on July 29, 2019.  Last treatment given on November 08, 2020.  She is status post left frontal craniotomy and resection of her residual melanoma completed on May 01, 2021.  Pembrolizumab 400 mg every 6 weeks started on Feb 15, 2021.  Last treatment given on March 29, 2021.  Stereotactic radiosurgery started on September 6.  She completed treatment on 06/22/2021.   Ipilimumab 1 mg/kg with nivolumab 3 mg/kg started on 06/21/2021.  Therapy currently on hold for hepatotoxicity.  Current therapy: Under consideration for additional therapy.      Interim History: Angela Carpenter returns today for a follow-up visit.  Since her last visit, she had a repeat MRI on September 03, 2021 which showed dural enhancement indicating metastatic disease.  Clinically, she reports continuous decline in her overall health and mobility.  According to her son, she is noted increased time in bed and chair and overall increase right-sided muscle weakness in her upper and lower extremity.  He denies any fevers, chills or sweats.  She denies any hospitalization or illnesses.  Despite dexamethasone treatment she has not noticed any improvement.     Medications: Updated on review. Current Outpatient Medications  Medication Sig Dispense Refill   acyclovir (ZOVIRAX) 400 MG tablet Take 1 tablet (400 mg total) by mouth 2 (two) times daily. 180 tablet 2   bethanechol (URECHOLINE) 50 MG tablet Take 1  tablet (50 mg total) by mouth 3 (three) times daily. 180 tablet 2   bisacodyl (DULCOLAX) 10 MG suppository Place 1 suppository (10 mg total) rectally daily as needed for moderate constipation. 12 suppository 0   camphor-menthol (SARNA) lotion Apply topically as needed for itching. (Patient taking differently: Apply 1 application topically daily as needed for itching.) 222 mL 0   cetirizine (ZYRTEC) 10 MG tablet Take 10 mg by mouth 2 (two) times daily.     Cholecalciferol (VITAMIN D3) 250 MCG (10000 UT) capsule 1 tablet     dexamethasone (DECADRON) 2 MG tablet Take 1 tablet (2 mg total) by mouth 2 (two) times daily with a meal. Directions: take 2 tabs  twice a day for 4 days; then 2 tablets daily for 4 days, then 1 tablet daily 48 tablet 0   hydrOXYzine (ATARAX/VISTARIL) 25 MG tablet Take 25 mg by mouth 3 (three) times daily.     levETIRAcetam (KEPPRA) 1000 MG tablet Take 1 tablet (1,000 mg total) by mouth 2 (two) times daily. 60 tablet 3   metoprolol tartrate (LOPRESSOR) 25 MG tablet Take 1 tablet by mouth 2 (two) times daily.     Multiple Vitamins-Minerals (PRESERVISION AREDS 2+MULTI VIT) CAPS Take 1 capsule by mouth 2 (two) times daily.     pantoprazole (PROTONIX) 40 MG tablet Take 1 tablet (40 mg total) by mouth at bedtime. 90 tablet 2   polyethylene glycol (MIRALAX / GLYCOLAX) 17 g packet Take 17 g by mouth 2 (two) times daily. 60 each 0   senna-docusate (SENOKOT-S) 8.6-50 MG tablet Take 1 tablet by  mouth 2 (two) times daily. 60 tablet 0   tamsulosin (FLOMAX) 0.4 MG CAPS capsule Take 1 capsule (0.4 mg total) by mouth daily after supper. 90 capsule 5   timolol (TIMOPTIC) 0.5 % ophthalmic solution Place 1 drop into both eyes 2 (two) times daily. 30 mL 2   No current facility-administered medications for this visit.     Allergies: No Known Allergies     Physical Exam:      Blood pressure 139/70, pulse 99, temperature 98.1 F (36.7 C), temperature source Temporal, resp. rate 16,  height 5\' 5"  (1.651 m), SpO2 99 %.      ECOG: 1    General appearance: Comfortable appearing without any discomfort Head: Normocephalic without any trauma Oropharynx: Mucous membranes are moist and pink without any thrush or ulcers. Eyes: Pupils are equal and round reactive to light. Lymph nodes: No cervical, supraclavicular, inguinal or axillary lymphadenopathy.   Heart:regular rate and rhythm.  S1 and S2 without leg edema. Lung: Clear without any rhonchi or wheezes.  No dullness to percussion. Abdomin: Soft, nontender, nondistended with good bowel sounds.  No hepatosplenomegaly. Musculoskeletal: No joint deformity or effusion.  Full range of motion noted. Neurological: No deficits noted on motor, sensory and deep tendon reflex exam. Skin: No petechial rash or dryness.  Appeared moist.                                   Lab Results: Lab Results  Component Value Date   WBC 9.8 08/23/2021   HGB 11.3 (L) 08/23/2021   HCT 33.8 (L) 08/23/2021   MCV 94.2 08/23/2021   PLT 188 08/23/2021     Chemistry      Component Value Date/Time   NA 137 08/23/2021 1148   NA 138 12/05/2016 0750   K 3.8 08/23/2021 1148   CL 102 08/23/2021 1148   CO2 28 08/23/2021 1148   BUN 24 (H) 08/23/2021 1148   BUN 15 12/05/2016 0750   CREATININE 0.81 08/23/2021 1148   CREATININE 0.70 09/05/2015 0828      Component Value Date/Time   CALCIUM 8.9 08/23/2021 1148   ALKPHOS 442 (H) 08/23/2021 1148   AST 62 (H) 08/23/2021 1148   ALT 211 (H) 08/23/2021 1148   BILITOT 2.3 (H) 08/23/2021 1148       IMPRESSION: 1. Contraction of left frontal resection cavity with mild peripheral contrast enhancement, including a new area of nodularity along the posterior margin that could indicate recurrent/residual tumor. 2. New, diffuse nodular thickening along the left falx cerebri and left hemispheric dura, concerning for dural metastatic disease.     Impression and  Plan:  80 year old with:   1.   Mucosal melanoma diagnosed in 2020.  He developed stage IV disease with CNS involvement.  Disease status was updated at this time and MRI results from November 28 were personally reviewed and discussed with the patient and her son.  Her case was also discussed with Dr. Lisbeth Renshaw and Dr. Mickeal Skinner regarding treatment choices.  At this time, systemic therapy options are limited.  Restarting immunotherapy with ipilimumab and nivolumab would be reasonable versus different salvage therapy using experimental options and lag 3 inhibitor.  Referral to a tertiary care center is also offered at this time.  Her prognosis was discussed today in detail and regardless of the treatment approach she understands that she has a poor prognosis with limited life expectancy.  We have discussed  the transition to hospice and the logistics.   After discussion today, she will consider these options and let me know in the near future.  In the meantime we will make a referral to palliative care services to provide assistance for the patient and her son.  2.  Immune mediated complications: Most recent LFTs continues to improve with AST at 62, ALT is 211 with bilirubin down to 2.3.  No other autoimmune complications noted.   3.  IV access: Port-A-Cath remains in place and will be in use periodically.   4.  Antiemetics: Compazine is available to her without any nausea or vomiting.  5.  CNS metastasis: Whole brain radiation therapy would be another option to palliate her disease.  This is under consideration with Dr. Lisbeth Renshaw.  At this time, she is not open to this idea.  6.   Follow-up: In the next 3 weeks to follow her progress.  30 minutes were spent on this visit.  The time was dedicated to reviewing imaging studies, treatment choices, review of her prognosis and future plan of care discussion.   Zola Button, MD 12/1/202212:09 PM

## 2021-09-06 NOTE — Telephone Encounter (Signed)
Referral called to [facility] for Palliative Care

## 2021-09-06 NOTE — Telephone Encounter (Signed)
Understood, thanks

## 2021-09-06 NOTE — Telephone Encounter (Signed)
(  4:40 pm) Palliative care SW scheduled initial palliative care visit. RN/SW scheduled to see patient 09/11/21 @10  am.

## 2021-09-08 DIAGNOSIS — K219 Gastro-esophageal reflux disease without esophagitis: Secondary | ICD-10-CM | POA: Diagnosis not present

## 2021-09-08 DIAGNOSIS — C44590 Other specified malignant neoplasm of anal skin: Secondary | ICD-10-CM | POA: Diagnosis not present

## 2021-09-08 DIAGNOSIS — H409 Unspecified glaucoma: Secondary | ICD-10-CM | POA: Diagnosis not present

## 2021-09-08 DIAGNOSIS — C7931 Secondary malignant neoplasm of brain: Secondary | ICD-10-CM | POA: Diagnosis not present

## 2021-09-08 DIAGNOSIS — Z853 Personal history of malignant neoplasm of breast: Secondary | ICD-10-CM | POA: Diagnosis not present

## 2021-09-08 DIAGNOSIS — J302 Other seasonal allergic rhinitis: Secondary | ICD-10-CM | POA: Diagnosis not present

## 2021-09-08 DIAGNOSIS — R569 Unspecified convulsions: Secondary | ICD-10-CM | POA: Diagnosis not present

## 2021-09-08 DIAGNOSIS — I10 Essential (primary) hypertension: Secondary | ICD-10-CM | POA: Diagnosis not present

## 2021-09-08 DIAGNOSIS — G8191 Hemiplegia, unspecified affecting right dominant side: Secondary | ICD-10-CM | POA: Diagnosis not present

## 2021-09-09 DIAGNOSIS — I10 Essential (primary) hypertension: Secondary | ICD-10-CM | POA: Diagnosis not present

## 2021-09-09 DIAGNOSIS — C7931 Secondary malignant neoplasm of brain: Secondary | ICD-10-CM | POA: Diagnosis not present

## 2021-09-09 DIAGNOSIS — K219 Gastro-esophageal reflux disease without esophagitis: Secondary | ICD-10-CM | POA: Diagnosis not present

## 2021-09-09 DIAGNOSIS — C44590 Other specified malignant neoplasm of anal skin: Secondary | ICD-10-CM | POA: Diagnosis not present

## 2021-09-09 DIAGNOSIS — G8191 Hemiplegia, unspecified affecting right dominant side: Secondary | ICD-10-CM | POA: Diagnosis not present

## 2021-09-09 DIAGNOSIS — R569 Unspecified convulsions: Secondary | ICD-10-CM | POA: Diagnosis not present

## 2021-09-10 DIAGNOSIS — I10 Essential (primary) hypertension: Secondary | ICD-10-CM | POA: Diagnosis not present

## 2021-09-10 DIAGNOSIS — R569 Unspecified convulsions: Secondary | ICD-10-CM | POA: Diagnosis not present

## 2021-09-10 DIAGNOSIS — C44590 Other specified malignant neoplasm of anal skin: Secondary | ICD-10-CM | POA: Diagnosis not present

## 2021-09-10 DIAGNOSIS — K219 Gastro-esophageal reflux disease without esophagitis: Secondary | ICD-10-CM | POA: Diagnosis not present

## 2021-09-10 DIAGNOSIS — G8191 Hemiplegia, unspecified affecting right dominant side: Secondary | ICD-10-CM | POA: Diagnosis not present

## 2021-09-10 DIAGNOSIS — C7931 Secondary malignant neoplasm of brain: Secondary | ICD-10-CM | POA: Diagnosis not present

## 2021-09-11 ENCOUNTER — Other Ambulatory Visit: Payer: Medicare Other

## 2021-09-11 ENCOUNTER — Other Ambulatory Visit: Payer: Medicare Other | Admitting: *Deleted

## 2021-09-11 DIAGNOSIS — C44590 Other specified malignant neoplasm of anal skin: Secondary | ICD-10-CM | POA: Diagnosis not present

## 2021-09-11 DIAGNOSIS — G8191 Hemiplegia, unspecified affecting right dominant side: Secondary | ICD-10-CM | POA: Diagnosis not present

## 2021-09-11 DIAGNOSIS — I10 Essential (primary) hypertension: Secondary | ICD-10-CM | POA: Diagnosis not present

## 2021-09-11 DIAGNOSIS — R569 Unspecified convulsions: Secondary | ICD-10-CM | POA: Diagnosis not present

## 2021-09-11 DIAGNOSIS — K219 Gastro-esophageal reflux disease without esophagitis: Secondary | ICD-10-CM | POA: Diagnosis not present

## 2021-09-11 DIAGNOSIS — C7931 Secondary malignant neoplasm of brain: Secondary | ICD-10-CM | POA: Diagnosis not present

## 2021-09-12 ENCOUNTER — Telehealth: Payer: Self-pay | Admitting: *Deleted

## 2021-09-12 ENCOUNTER — Encounter: Payer: Medicare Other | Admitting: Physical Medicine & Rehabilitation

## 2021-09-12 DIAGNOSIS — G8191 Hemiplegia, unspecified affecting right dominant side: Secondary | ICD-10-CM | POA: Diagnosis not present

## 2021-09-12 DIAGNOSIS — R569 Unspecified convulsions: Secondary | ICD-10-CM | POA: Diagnosis not present

## 2021-09-12 DIAGNOSIS — K219 Gastro-esophageal reflux disease without esophagitis: Secondary | ICD-10-CM | POA: Diagnosis not present

## 2021-09-12 DIAGNOSIS — I10 Essential (primary) hypertension: Secondary | ICD-10-CM | POA: Diagnosis not present

## 2021-09-12 DIAGNOSIS — C7931 Secondary malignant neoplasm of brain: Secondary | ICD-10-CM | POA: Diagnosis not present

## 2021-09-12 DIAGNOSIS — C44590 Other specified malignant neoplasm of anal skin: Secondary | ICD-10-CM | POA: Diagnosis not present

## 2021-09-12 NOTE — Telephone Encounter (Signed)
Comfort Care orders signed by Dr. Alen Blew & faxed to [facility].  Fax confirmation received.

## 2021-09-13 DIAGNOSIS — C44590 Other specified malignant neoplasm of anal skin: Secondary | ICD-10-CM | POA: Diagnosis not present

## 2021-09-13 DIAGNOSIS — R569 Unspecified convulsions: Secondary | ICD-10-CM | POA: Diagnosis not present

## 2021-09-13 DIAGNOSIS — K219 Gastro-esophageal reflux disease without esophagitis: Secondary | ICD-10-CM | POA: Diagnosis not present

## 2021-09-13 DIAGNOSIS — I10 Essential (primary) hypertension: Secondary | ICD-10-CM | POA: Diagnosis not present

## 2021-09-13 DIAGNOSIS — C7931 Secondary malignant neoplasm of brain: Secondary | ICD-10-CM | POA: Diagnosis not present

## 2021-09-13 DIAGNOSIS — G8191 Hemiplegia, unspecified affecting right dominant side: Secondary | ICD-10-CM | POA: Diagnosis not present

## 2021-09-14 DIAGNOSIS — R569 Unspecified convulsions: Secondary | ICD-10-CM | POA: Diagnosis not present

## 2021-09-14 DIAGNOSIS — C7931 Secondary malignant neoplasm of brain: Secondary | ICD-10-CM | POA: Diagnosis not present

## 2021-09-14 DIAGNOSIS — I10 Essential (primary) hypertension: Secondary | ICD-10-CM | POA: Diagnosis not present

## 2021-09-14 DIAGNOSIS — G8191 Hemiplegia, unspecified affecting right dominant side: Secondary | ICD-10-CM | POA: Diagnosis not present

## 2021-09-14 DIAGNOSIS — C44590 Other specified malignant neoplasm of anal skin: Secondary | ICD-10-CM | POA: Diagnosis not present

## 2021-09-14 DIAGNOSIS — K219 Gastro-esophageal reflux disease without esophagitis: Secondary | ICD-10-CM | POA: Diagnosis not present

## 2021-09-15 ENCOUNTER — Other Ambulatory Visit: Payer: Medicare Other

## 2021-09-17 DIAGNOSIS — G8191 Hemiplegia, unspecified affecting right dominant side: Secondary | ICD-10-CM | POA: Diagnosis not present

## 2021-09-17 DIAGNOSIS — R569 Unspecified convulsions: Secondary | ICD-10-CM | POA: Diagnosis not present

## 2021-09-17 DIAGNOSIS — K219 Gastro-esophageal reflux disease without esophagitis: Secondary | ICD-10-CM | POA: Diagnosis not present

## 2021-09-17 DIAGNOSIS — C44590 Other specified malignant neoplasm of anal skin: Secondary | ICD-10-CM | POA: Diagnosis not present

## 2021-09-17 DIAGNOSIS — C7931 Secondary malignant neoplasm of brain: Secondary | ICD-10-CM | POA: Diagnosis not present

## 2021-09-17 DIAGNOSIS — I10 Essential (primary) hypertension: Secondary | ICD-10-CM | POA: Diagnosis not present

## 2021-09-18 DIAGNOSIS — C7931 Secondary malignant neoplasm of brain: Secondary | ICD-10-CM | POA: Diagnosis not present

## 2021-09-18 DIAGNOSIS — R569 Unspecified convulsions: Secondary | ICD-10-CM | POA: Diagnosis not present

## 2021-09-18 DIAGNOSIS — I10 Essential (primary) hypertension: Secondary | ICD-10-CM | POA: Diagnosis not present

## 2021-09-18 DIAGNOSIS — C44590 Other specified malignant neoplasm of anal skin: Secondary | ICD-10-CM | POA: Diagnosis not present

## 2021-09-18 DIAGNOSIS — K219 Gastro-esophageal reflux disease without esophagitis: Secondary | ICD-10-CM | POA: Diagnosis not present

## 2021-09-18 DIAGNOSIS — G8191 Hemiplegia, unspecified affecting right dominant side: Secondary | ICD-10-CM | POA: Diagnosis not present

## 2021-09-19 DIAGNOSIS — C44590 Other specified malignant neoplasm of anal skin: Secondary | ICD-10-CM | POA: Diagnosis not present

## 2021-09-19 DIAGNOSIS — C7931 Secondary malignant neoplasm of brain: Secondary | ICD-10-CM | POA: Diagnosis not present

## 2021-09-19 DIAGNOSIS — K219 Gastro-esophageal reflux disease without esophagitis: Secondary | ICD-10-CM | POA: Diagnosis not present

## 2021-09-19 DIAGNOSIS — G8191 Hemiplegia, unspecified affecting right dominant side: Secondary | ICD-10-CM | POA: Diagnosis not present

## 2021-09-19 DIAGNOSIS — R569 Unspecified convulsions: Secondary | ICD-10-CM | POA: Diagnosis not present

## 2021-09-19 DIAGNOSIS — I10 Essential (primary) hypertension: Secondary | ICD-10-CM | POA: Diagnosis not present

## 2021-09-20 ENCOUNTER — Other Ambulatory Visit: Payer: Medicare Other

## 2021-09-20 DIAGNOSIS — C44590 Other specified malignant neoplasm of anal skin: Secondary | ICD-10-CM | POA: Diagnosis not present

## 2021-09-20 DIAGNOSIS — G8191 Hemiplegia, unspecified affecting right dominant side: Secondary | ICD-10-CM | POA: Diagnosis not present

## 2021-09-20 DIAGNOSIS — I10 Essential (primary) hypertension: Secondary | ICD-10-CM | POA: Diagnosis not present

## 2021-09-20 DIAGNOSIS — R569 Unspecified convulsions: Secondary | ICD-10-CM | POA: Diagnosis not present

## 2021-09-20 DIAGNOSIS — K219 Gastro-esophageal reflux disease without esophagitis: Secondary | ICD-10-CM | POA: Diagnosis not present

## 2021-09-20 DIAGNOSIS — C7931 Secondary malignant neoplasm of brain: Secondary | ICD-10-CM | POA: Diagnosis not present

## 2021-09-21 ENCOUNTER — Telehealth: Payer: Self-pay | Admitting: *Deleted

## 2021-09-21 DIAGNOSIS — K219 Gastro-esophageal reflux disease without esophagitis: Secondary | ICD-10-CM | POA: Diagnosis not present

## 2021-09-21 DIAGNOSIS — C44590 Other specified malignant neoplasm of anal skin: Secondary | ICD-10-CM | POA: Diagnosis not present

## 2021-09-21 DIAGNOSIS — G8191 Hemiplegia, unspecified affecting right dominant side: Secondary | ICD-10-CM | POA: Diagnosis not present

## 2021-09-21 DIAGNOSIS — C7931 Secondary malignant neoplasm of brain: Secondary | ICD-10-CM | POA: Diagnosis not present

## 2021-09-21 DIAGNOSIS — I10 Essential (primary) hypertension: Secondary | ICD-10-CM | POA: Diagnosis not present

## 2021-09-21 DIAGNOSIS — R569 Unspecified convulsions: Secondary | ICD-10-CM | POA: Diagnosis not present

## 2021-09-21 NOTE — Telephone Encounter (Signed)
Received vm message from pt's son, [name] stating pt is under [place] declining & in final stages & probably will die in next few days.  They are just trying to keep her comfortable.  He noted that she still has some appts scheduled & wanted [place] to know that she would not be back in to see Dr Alen Blew or Dr Mickeal Skinner. Will cancel all appts.

## 2021-09-22 DIAGNOSIS — C44590 Other specified malignant neoplasm of anal skin: Secondary | ICD-10-CM | POA: Diagnosis not present

## 2021-09-22 DIAGNOSIS — G8191 Hemiplegia, unspecified affecting right dominant side: Secondary | ICD-10-CM | POA: Diagnosis not present

## 2021-09-22 DIAGNOSIS — K219 Gastro-esophageal reflux disease without esophagitis: Secondary | ICD-10-CM | POA: Diagnosis not present

## 2021-09-22 DIAGNOSIS — R569 Unspecified convulsions: Secondary | ICD-10-CM | POA: Diagnosis not present

## 2021-09-22 DIAGNOSIS — C7931 Secondary malignant neoplasm of brain: Secondary | ICD-10-CM | POA: Diagnosis not present

## 2021-09-22 DIAGNOSIS — I10 Essential (primary) hypertension: Secondary | ICD-10-CM | POA: Diagnosis not present

## 2021-09-23 DIAGNOSIS — R569 Unspecified convulsions: Secondary | ICD-10-CM | POA: Diagnosis not present

## 2021-09-23 DIAGNOSIS — I10 Essential (primary) hypertension: Secondary | ICD-10-CM | POA: Diagnosis not present

## 2021-09-23 DIAGNOSIS — C44590 Other specified malignant neoplasm of anal skin: Secondary | ICD-10-CM | POA: Diagnosis not present

## 2021-09-23 DIAGNOSIS — C7931 Secondary malignant neoplasm of brain: Secondary | ICD-10-CM | POA: Diagnosis not present

## 2021-09-23 DIAGNOSIS — G8191 Hemiplegia, unspecified affecting right dominant side: Secondary | ICD-10-CM | POA: Diagnosis not present

## 2021-09-23 DIAGNOSIS — K219 Gastro-esophageal reflux disease without esophagitis: Secondary | ICD-10-CM | POA: Diagnosis not present

## 2021-09-24 ENCOUNTER — Ambulatory Visit: Payer: Medicare Other | Admitting: Internal Medicine

## 2021-09-24 ENCOUNTER — Other Ambulatory Visit: Payer: Medicare Other | Admitting: *Deleted

## 2021-09-24 DIAGNOSIS — G8191 Hemiplegia, unspecified affecting right dominant side: Secondary | ICD-10-CM | POA: Diagnosis not present

## 2021-09-24 DIAGNOSIS — C7931 Secondary malignant neoplasm of brain: Secondary | ICD-10-CM | POA: Diagnosis not present

## 2021-09-24 DIAGNOSIS — K219 Gastro-esophageal reflux disease without esophagitis: Secondary | ICD-10-CM | POA: Diagnosis not present

## 2021-09-24 DIAGNOSIS — R569 Unspecified convulsions: Secondary | ICD-10-CM | POA: Diagnosis not present

## 2021-09-24 DIAGNOSIS — I10 Essential (primary) hypertension: Secondary | ICD-10-CM | POA: Diagnosis not present

## 2021-09-24 DIAGNOSIS — C44590 Other specified malignant neoplasm of anal skin: Secondary | ICD-10-CM | POA: Diagnosis not present

## 2021-09-26 ENCOUNTER — Ambulatory Visit: Payer: Medicare Other | Admitting: Oncology

## 2021-09-26 ENCOUNTER — Other Ambulatory Visit: Payer: Medicare Other

## 2021-09-27 ENCOUNTER — Telehealth: Payer: Self-pay | Admitting: *Deleted

## 2021-09-27 ENCOUNTER — Other Ambulatory Visit: Payer: Self-pay | Admitting: *Deleted

## 2021-09-27 NOTE — Telephone Encounter (Signed)
Patient deceased 28-Sep-2021

## 2021-10-07 DEATH — deceased
